# Patient Record
Sex: Female | Born: 1945 | Race: White | Hispanic: No | Smoking: Former smoker
Health system: Southern US, Community
[De-identification: ages and names within clinical notes are randomized; demographics above are authoritative.]

## PROBLEM LIST (undated history)

## (undated) DIAGNOSIS — Z950 Presence of cardiac pacemaker: Secondary | ICD-10-CM

## (undated) DIAGNOSIS — E785 Hyperlipidemia, unspecified: Secondary | ICD-10-CM

## (undated) DIAGNOSIS — K449 Diaphragmatic hernia without obstruction or gangrene: Secondary | ICD-10-CM

## (undated) DIAGNOSIS — Z8619 Personal history of other infectious and parasitic diseases: Secondary | ICD-10-CM

## (undated) DIAGNOSIS — M1990 Unspecified osteoarthritis, unspecified site: Secondary | ICD-10-CM

## (undated) DIAGNOSIS — C8590 Non-Hodgkin lymphoma, unspecified, unspecified site: Secondary | ICD-10-CM

## (undated) HISTORY — PX: ANKLE SURGERY: SHX546

## (undated) HISTORY — DX: Personal history of other infectious and parasitic diseases: Z86.19

## (undated) HISTORY — DX: Unspecified osteoarthritis, unspecified site: M19.90

## (undated) HISTORY — DX: Hyperlipidemia, unspecified: E78.5

---

## 1999-07-31 ENCOUNTER — Encounter: Admission: RE | Admit: 1999-07-31 | Discharge: 1999-07-31 | Payer: Self-pay | Admitting: Emergency Medicine

## 1999-07-31 ENCOUNTER — Encounter: Payer: Self-pay | Admitting: Emergency Medicine

## 2001-02-17 ENCOUNTER — Encounter: Admission: RE | Admit: 2001-02-17 | Discharge: 2001-02-17 | Payer: Self-pay | Admitting: Emergency Medicine

## 2001-02-17 ENCOUNTER — Encounter: Payer: Self-pay | Admitting: Emergency Medicine

## 2001-03-18 ENCOUNTER — Other Ambulatory Visit: Admission: RE | Admit: 2001-03-18 | Discharge: 2001-03-18 | Payer: Self-pay | Admitting: Emergency Medicine

## 2002-02-22 ENCOUNTER — Encounter: Payer: Self-pay | Admitting: Emergency Medicine

## 2002-02-22 ENCOUNTER — Encounter: Admission: RE | Admit: 2002-02-22 | Discharge: 2002-02-22 | Payer: Self-pay | Admitting: Emergency Medicine

## 2002-04-24 ENCOUNTER — Encounter: Payer: Self-pay | Admitting: Emergency Medicine

## 2002-04-24 ENCOUNTER — Encounter: Admission: RE | Admit: 2002-04-24 | Discharge: 2002-04-24 | Payer: Self-pay | Admitting: Emergency Medicine

## 2004-06-27 ENCOUNTER — Encounter: Admission: RE | Admit: 2004-06-27 | Discharge: 2004-06-27 | Payer: Self-pay | Admitting: Emergency Medicine

## 2006-01-06 ENCOUNTER — Encounter: Admission: RE | Admit: 2006-01-06 | Discharge: 2006-01-06 | Payer: Self-pay | Admitting: Family Medicine

## 2006-10-05 LAB — HM COLONOSCOPY

## 2007-01-10 ENCOUNTER — Encounter: Admission: RE | Admit: 2007-01-10 | Discharge: 2007-01-10 | Payer: Self-pay | Admitting: Obstetrics and Gynecology

## 2007-02-11 ENCOUNTER — Ambulatory Visit (HOSPITAL_COMMUNITY): Admission: RE | Admit: 2007-02-11 | Discharge: 2007-02-11 | Payer: Self-pay | Admitting: Obstetrics and Gynecology

## 2010-07-16 ENCOUNTER — Encounter: Admission: RE | Admit: 2010-07-16 | Discharge: 2010-07-16 | Payer: Self-pay | Admitting: Obstetrics and Gynecology

## 2011-07-31 ENCOUNTER — Other Ambulatory Visit: Payer: Self-pay | Admitting: Obstetrics and Gynecology

## 2011-07-31 ENCOUNTER — Other Ambulatory Visit: Payer: Self-pay | Admitting: Family Medicine

## 2011-07-31 DIAGNOSIS — Z1231 Encounter for screening mammogram for malignant neoplasm of breast: Secondary | ICD-10-CM

## 2011-08-17 ENCOUNTER — Ambulatory Visit: Payer: Self-pay

## 2012-07-29 DIAGNOSIS — Z23 Encounter for immunization: Secondary | ICD-10-CM | POA: Diagnosis not present

## 2012-09-01 ENCOUNTER — Emergency Department (HOSPITAL_COMMUNITY): Payer: Medicare Other

## 2012-09-01 ENCOUNTER — Emergency Department (HOSPITAL_COMMUNITY)
Admission: EM | Admit: 2012-09-01 | Discharge: 2012-09-01 | Disposition: A | Discharge status: Home / Self Care | Payer: Medicare Other | Attending: Emergency Medicine | Admitting: Emergency Medicine

## 2012-09-01 ENCOUNTER — Encounter (HOSPITAL_COMMUNITY): Payer: Self-pay

## 2012-09-01 DIAGNOSIS — R072 Precordial pain: Secondary | ICD-10-CM | POA: Diagnosis not present

## 2012-09-01 DIAGNOSIS — R110 Nausea: Secondary | ICD-10-CM | POA: Insufficient documentation

## 2012-09-01 DIAGNOSIS — R079 Chest pain, unspecified: Secondary | ICD-10-CM | POA: Diagnosis not present

## 2012-09-01 DIAGNOSIS — R0602 Shortness of breath: Secondary | ICD-10-CM | POA: Insufficient documentation

## 2012-09-01 DIAGNOSIS — K449 Diaphragmatic hernia without obstruction or gangrene: Secondary | ICD-10-CM | POA: Diagnosis not present

## 2012-09-01 DIAGNOSIS — R61 Generalized hyperhidrosis: Secondary | ICD-10-CM | POA: Insufficient documentation

## 2012-09-01 LAB — COMPREHENSIVE METABOLIC PANEL
ALT: 16 U/L (ref 0–35)
AST: 20 U/L (ref 0–37)
Alkaline Phosphatase: 84 U/L (ref 39–117)
CO2: 25 mEq/L (ref 19–32)
Calcium: 9.3 mg/dL (ref 8.4–10.5)
Chloride: 101 mEq/L (ref 96–112)
GFR calc Af Amer: >90 mL/min (ref >90)
GFR calc non Af Amer: 88 mL/min — ABNORMAL LOW (ref >90)
Glucose, Bld: 170 mg/dL — ABNORMAL HIGH (ref 70–99)
Potassium: 3.8 mEq/L (ref 3.5–5.1)
Sodium: 136 mEq/L (ref 135–145)
Total Bilirubin: 0.7 mg/dL (ref 0.3–1.2)

## 2012-09-01 LAB — CBC WITH DIFFERENTIAL/PLATELET
Basophils Absolute: 0 10*3/uL (ref 0.0–0.1)
Eosinophils Relative: 2 % (ref 0–5)
Lymphocytes Relative: 11 % — ABNORMAL LOW (ref 12–46)
Lymphs Abs: 0.9 10*3/uL (ref 0.7–4.0)
MCV: 87 fL (ref 78.0–100.0)
Neutro Abs: 6.9 10*3/uL (ref 1.7–7.7)
Platelets: 235 10*3/uL (ref 150–400)
RBC: 3.92 MIL/uL (ref 3.87–5.11)
WBC: 8.4 10*3/uL (ref 4.0–10.5)

## 2012-09-01 MED ORDER — MORPHINE SULFATE 4 MG/ML IJ SOLN
4.0000 mg | Freq: Once | INTRAMUSCULAR | Status: AC
  Administered 2012-09-01: 4 mg via INTRAVENOUS
  Filled 2012-09-01: qty 1

## 2012-09-01 NOTE — ED Notes (Signed)
Patient has arrived in cdu for further monitoring and for a repeat troponin after an episode of chest pain at home around noon. States pain started in her left shoulder and radiated across her chest to her epigastric area. States became very sweaty but not sob.

## 2012-09-01 NOTE — ED Provider Notes (Signed)
4:23 PM  Patient is a 66 yo F with no sig PMHx that presented to ER for substernal CP onset around 1 pm was placed in CDU pending 5:30 PM troponin. Patient care resumed from Dr. Estell Harpin in fast track. Patient re-evaluated and is resting comfortable chest pain free with VSS, with no new complaints or concerns at this time. Plan per previous provider is to dc if troponin is not elevated, start on PPI & follow up w OP cardiology. On exam: hemodynamically stable, NAD, heart w/ RRR, lungs CTAB, Chest & abd non-tender, no peripheral edema or calf tenderness.   5:30 PM Pt still CP free, trop pending  6:32 PM  Patient is to be discharged with recommendation to follow up with PCP & cardiologist in regards to today's hospital visit. Chest pain is not likely of cardiac or pulmonary etiology d/t presentation, perc negative, VSS, no tracheal deviation, no JVD or new murmur, RRR, breath sounds equal bilaterally, EKG without acute abnormalities, negative troponin x 2, and negative CXR. Pt has been advised start a PPI and return to the ED is CP becomes exertional, associated with diaphoresis or nausea, radiates to left jaw/arm, worsens or becomes concerning in any way. Pt appears reliable for follow up and is agreeable to discharge.   Case has been discussed with and seen by Dr. Estell Harpin who agrees with the above plan to discharge.    Jaci Carrel, [place] 09/01/12 [phone]

## 2012-09-01 NOTE — ED Provider Notes (Signed)
History  This chart was scribed for Patricia Lennert, MD by Erskine Emery, ED Scribe. This patient was seen in room A08C/A08C and the patient's care was started at 14:07.   CSN: [id number]  Arrival date & time 09/01/12  1348   First MD Initiated Contact with Patient 09/01/12 1407      Chief Complaint  Patient presents with  . Chest Pain    (Consider location/radiation/quality/duration/timing/severity/associated sxs/prior Treatment) Patricia Murillo is a 66 y.o. female brought in by ambulance, who presents to the Emergency Department complaining of constant, sudden onset substernal chest pain that radiates to the left shoulder, since she began eating around 12:50pm this afternoon. Pt reports some associated SOB, diaphoresis, and nausea, but denies any coughing, emesis, or pain upon breathing. Pt reports the pain subsided en route, but she is still experiencing some chest pressure now. Pt was given 324 mg baby aspirin and 4 sl nitro via EMS. Pt is on no medications. Patient is a 66 y.o. female presenting with chest pain. The history is provided by the patient. No language interpreter was used.  Chest Pain The chest pain began 1 - 2 hours ago. Chest pain occurs constantly. The chest pain is improving. The pain is associated with eating. The severity of the pain is moderate. The pain radiates to the left arm. Chest pain is worsened by eating. Primary symptoms include shortness of breath and nausea. Pertinent negatives for primary symptoms include no fever, no cough, no vomiting and no altered mental status.  Associated symptoms include diaphoresis. She tried nitroglycerin and aspirin for the symptoms. There are no known risk factors.  Pertinent negatives for past medical history include no MI, no PE and no strokes.   Pt has no PCP.  History reviewed. No pertinent past medical history.  History reviewed. No pertinent past surgical history.  History reviewed. No pertinent family  history.  History  Substance Use Topics  . Smoking status: Not on file  . Smokeless tobacco: Not on file  . Alcohol Use: Not on file    OB History    Grav Para Term Preterm Abortions TAB SAB Ect Mult Living                  Review of Systems  Constitutional: Positive for diaphoresis. Negative for fever.  Respiratory: Positive for shortness of breath. Negative for cough.   Cardiovascular: Positive for chest pain.  Gastrointestinal: Positive for nausea. Negative for vomiting.  Psychiatric/Behavioral: Negative for altered mental status.  All other systems reviewed and are negative.    Allergies  Review of patient's allergies indicates not on file.  Home Medications  No current outpatient prescriptions on file.  Triage Vitals: BP 168/91  Pulse 100  Temp 98.1 F (36.7 C) (Oral)  Resp 22  Ht 5\' 7"  (1.702 m)  Wt 220 lb (99.791 kg)  BMI 34.46 kg/m2  SpO2 96%  Physical Exam  Nursing note and vitals reviewed. Constitutional: She is oriented to person, place, and time. She appears well-developed.       Anxious appearing  HENT:  Head: Normocephalic and atraumatic.  Eyes: Conjunctivae normal and EOM are normal. No scleral icterus.  Neck: Neck supple. No thyromegaly present.  Cardiovascular: Normal rate and regular rhythm.  Exam reveals no gallop and no friction rub.   No murmur heard. Pulmonary/Chest: No stridor. She has no wheezes. She has no rales. She exhibits no tenderness.  Abdominal: She exhibits no distension. There is no tenderness. There is no  rebound.  Musculoskeletal: Normal range of motion. She exhibits no edema.  Lymphadenopathy:    She has no cervical adenopathy.  Neurological: She is oriented to person, place, and time. Coordination normal.  Skin: No rash noted. No erythema.  Psychiatric: She has a normal mood and affect. Her behavior is normal.    ED Course  Procedures (including critical care time) DIAGNOSTIC STUDIES: Oxygen Saturation is 96% on [place],  adequate by my interpretation.    COORDINATION OF CARE: 14:14--I evaluated the patient and we discussed a treatment plan including morphine injection, chest x-ray, and blood work to which the pt agreed. I informed the pt and her family that her EKG looks fine and shows no indication of heart attack.  15:30--I rechecked the pt who is feeling improved. I explained to the pt the normal results of her tests: her heart enzymes are normal, as are her blood sugars, her sodium, and her potassium levels. However, the pt does have a hiatal hernia that could be causing her symptoms. I notified her that we need to do another chest x-ray and heart enzyme test to verify the results, but if those results are still normal, she will be free to go.   Results for orders placed during the hospital encounter of 09/01/12  CBC WITH DIFFERENTIAL      Component Value Range   WBC 8.4  4.0 - 10.5 K/uL   RBC 3.92  3.87 - 5.11 MIL/uL   Hemoglobin 11.1 (*) 12.0 - 15.0 g/dL   HCT 40.9 (*) 81.1 - 91.4 %   MCV 87.0  78.0 - 100.0 fL   MCH 28.3  26.0 - 34.0 pg   MCHC 32.6  30.0 - 36.0 g/dL   RDW 78.2  95.6 - 21.3 %   Platelets 235  150 - 400 K/uL   Neutrophils Relative 82 (*) 43 - 77 %   Neutro Abs 6.9  1.7 - 7.7 K/uL   Lymphocytes Relative 11 (*) 12 - 46 %   Lymphs Abs 0.9  0.7 - 4.0 K/uL   Monocytes Relative 5  3 - 12 %   Monocytes Absolute 0.4  0.1 - 1.0 K/uL   Eosinophils Relative 2  0 - 5 %   Eosinophils Absolute 0.2  0.0 - 0.7 K/uL   Basophils Relative 0  0 - 1 %   Basophils Absolute 0.0  0.0 - 0.1 K/uL  COMPREHENSIVE METABOLIC PANEL      Component Value Range   Sodium 136  135 - 145 mEq/L   Potassium 3.8  3.5 - 5.1 mEq/L   Chloride 101  96 - 112 mEq/L   CO2 25  19 - 32 mEq/L   Glucose, Bld 170 (*) 70 - 99 mg/dL   BUN 18  6 - 23 mg/dL   Creatinine, Ser 0.86  0.50 - 1.10 mg/dL   Calcium 9.3  8.4 - 57.8 mg/dL   Total Protein 6.6  6.0 - 8.3 g/dL   Albumin 3.6  3.5 - 5.2 g/dL   AST 20  0 - 37 U/L   ALT 16   0 - 35 U/L   Alkaline Phosphatase 84  39 - 117 U/L   Total Bilirubin 0.7  0.3 - 1.2 mg/dL   GFR calc non Af Amer 88 (*) >90 mL/min   GFR calc Af Amer >90  >90 mL/min  TROPONIN I      Component Value Range   Troponin I <0.30  <0.30 ng/mL   Dg Chest  Port 1 View  09/01/2012  *RADIOLOGY REPORT*  Clinical Data: Shortness of breath and cough, left-sided chest pain  PORTABLE CHEST - 1 VIEW  Comparison: None.  Findings: Mild to moderate enlargement of the cardiomediastinal silhouette is noted.  Large hiatal hernia.  Cardiac leads obscure detail.  Minimal patchy left lower lobe airspace opacity is noted. No acute osseous finding.  IMPRESSION: Patchy minimal left lower lobe airspace opacity.  Given the history of left-sided chest pain, this could represent early pneumonia of the atelectasis or scarring could have a similar appearance.  If symptoms continue, consider PA and lateral chest radiograph at full inspiration when the patient is clinically able.   Original Report Authenticated By: Christiana Pellant, M.D.       No diagnosis found.    Date: 09/01/2012  Rate80  Rhythm: normal sinus rhythm  QRS Axis: normal  Intervals: normal  ST/T Wave abnormalities: nonspecific ST changes  Conduction Disutrbances:none  Narrative Interpretation:   Old EKG Reviewed: none available   MDM        The chart was scribed for me under my direct supervision.  I personally performed the history, physical, and medical decision making and all procedures in the evaluation of this patient.Patricia Lennert, MD 09/01/12 1540

## 2012-09-01 NOTE — ED Notes (Signed)
Chest pain started at 1250, pt received 324 mg ASA, 4 sl nitro, nausea no vomiting.  Pt is shob, substernal pain radiates to left shoulder.

## 2012-09-01 NOTE — ED Provider Notes (Signed)
Medical screening examination/treatment/procedure(s) were performed by non-physician practitioner and as supervising physician I was immediately available for consultation/collaboration.   [name], MD 09/01/12 [phone]

## 2012-09-08 ENCOUNTER — Ambulatory Visit (INDEPENDENT_AMBULATORY_CARE_PROVIDER_SITE_OTHER)
Admission: RE | Admit: 2012-09-08 | Discharge: 2012-09-08 | Disposition: A | Discharge status: Home / Self Care | Payer: Medicare Other | Source: Ambulatory Visit

## 2012-09-08 ENCOUNTER — Other Ambulatory Visit (INDEPENDENT_AMBULATORY_CARE_PROVIDER_SITE_OTHER): Payer: Medicare Other

## 2012-09-08 ENCOUNTER — Ambulatory Visit (INDEPENDENT_AMBULATORY_CARE_PROVIDER_SITE_OTHER): Payer: Medicare Other | Admitting: Internal Medicine

## 2012-09-08 ENCOUNTER — Encounter: Payer: Self-pay | Admitting: Internal Medicine

## 2012-09-08 VITALS — BP 128/92 | HR 86 | Temp 98.1°F | Ht 66.0 in | Wt 234.0 lb

## 2012-09-08 DIAGNOSIS — Z131 Encounter for screening for diabetes mellitus: Secondary | ICD-10-CM

## 2012-09-08 DIAGNOSIS — E785 Hyperlipidemia, unspecified: Secondary | ICD-10-CM | POA: Diagnosis not present

## 2012-09-08 DIAGNOSIS — M8580 Other specified disorders of bone density and structure, unspecified site: Secondary | ICD-10-CM

## 2012-09-08 DIAGNOSIS — R5383 Other fatigue: Secondary | ICD-10-CM | POA: Diagnosis not present

## 2012-09-08 DIAGNOSIS — M949 Disorder of cartilage, unspecified: Secondary | ICD-10-CM

## 2012-09-08 DIAGNOSIS — R030 Elevated blood-pressure reading, without diagnosis of hypertension: Secondary | ICD-10-CM

## 2012-09-08 DIAGNOSIS — M899 Disorder of bone, unspecified: Secondary | ICD-10-CM

## 2012-09-08 DIAGNOSIS — K449 Diaphragmatic hernia without obstruction or gangrene: Secondary | ICD-10-CM

## 2012-09-08 DIAGNOSIS — Z1321 Encounter for screening for nutritional disorder: Secondary | ICD-10-CM

## 2012-09-08 DIAGNOSIS — Z23 Encounter for immunization: Secondary | ICD-10-CM

## 2012-09-08 DIAGNOSIS — Z130 Encounter for screening for diseases of the blood and blood-forming organs and certain disorders involving the immune mechanism: Secondary | ICD-10-CM

## 2012-09-08 DIAGNOSIS — Z13220 Encounter for screening for lipoid disorders: Secondary | ICD-10-CM

## 2012-09-08 DIAGNOSIS — R5381 Other malaise: Secondary | ICD-10-CM

## 2012-09-08 DIAGNOSIS — Z0000 Encounter for general adult medical examination without abnormal findings: Secondary | ICD-10-CM | POA: Diagnosis not present

## 2012-09-08 LAB — CBC
HCT: 38 % (ref 36.0–46.0)
Hemoglobin: 12.5 g/dL (ref 12.0–15.0)
Platelets: 243 10*3/uL (ref 150.0–400.0)
RBC: 4.37 Mil/uL (ref 3.87–5.11)
WBC: 7.9 10*3/uL (ref 4.5–10.5)

## 2012-09-08 LAB — BASIC METABOLIC PANEL
CO2: 27 mEq/L (ref 19–32)
Calcium: 9.4 mg/dL (ref 8.4–10.5)
Chloride: 102 mEq/L (ref 96–112)
Creatinine, Ser: 0.7 mg/dL (ref 0.4–1.2)
Glucose, Bld: 103 mg/dL — ABNORMAL HIGH (ref 70–99)

## 2012-09-08 LAB — LIPID PANEL
Total CHOL/HDL Ratio: 4
Triglycerides: 104 mg/dL (ref 0.0–149.0)

## 2012-09-08 LAB — LDL CHOLESTEROL, DIRECT: Direct LDL: 176.1 mg/dL

## 2012-09-08 NOTE — Progress Notes (Signed)
HPI  Pt presents to the clinic today to establish care. She has no concerns.  Flu vaccine: 07/2012 Pneumovax: never  Zostavax: never Pap smear: 2011 Mammogram: 2011 Tdap: within the last 10 years Eye doctor: yearly Dentist: no Bone density: 2011  Past Medical History  Diagnosis Date  . Arthritis   . History of chicken pox     Current Outpatient Prescriptions  Medication Sig Dispense Refill  . omeprazole (PRILOSEC) 20 MG capsule Take 20 mg by mouth daily.      . naproxen sodium (ANAPROX) 220 MG tablet Take 220 mg by mouth 2 (two) times daily as needed. For pain        No Known Allergies  Family History  Problem Relation Age of Onset  . Hypertension Mother   . Arthritis Mother   . Arthritis Father   . Cancer Sister     Breast Cancer    History   Social History  . Marital Status: Divorced    Spouse Name: N/A    Number of Children: 3  . Years of Education: 12   Occupational History  . Retired    Social History Main Topics  . Smoking status: Former Smoker    Quit date: 10/05/2001  . Smokeless tobacco: Not on file  . Alcohol Use: No  . Drug Use: No  . Sexually Active: Not Currently   Other Topics Concern  . Not on file   Social History Narrative   Regular exercise-noCaffeine Use-yes    ROS:  Constitutional: Pt reports fatigue. Denies fever, malaise, headache or abrupt weight changes.  HEENT: Denies eye pain, eye redness, ear pain, ringing in the ears, wax buildup, runny nose, nasal congestion, bloody nose, or sore throat. Respiratory: Denies difficulty breathing, shortness of breath, cough or sputum production.   Cardiovascular: Denies chest pain, chest tightness, palpitations or swelling in the hands or feet.  Gastrointestinal: Denies abdominal pain, bloating, constipation, diarrhea or blood in the stool.  GU: Denies frequency, urgency, pain with urination, blood in urine, odor or discharge. Musculoskeletal: Denies decrease in range of motion,  difficulty with gait, muscle pain or joint pain and swelling.  Skin: Denies redness, rashes, lesions or ulcercations.  Neurological: Denies dizziness, difficulty with memory, difficulty with speech or problems with balance and coordination.   No other specific complaints in a complete review of systems (except as listed in HPI above).  PE:  BP 128/92  Pulse 86  Temp 98.1 F (36.7 C) (Oral)  Ht 5\' 6"  (1.676 m)  Wt 234 lb (106.142 kg)  BMI 37.77 kg/m2  SpO2 94% Wt Readings from Last 3 Encounters:  09/08/12 234 lb (106.142 kg)  09/01/12 220 lb (99.791 kg)    General: Appears her stated age, obese but well developed, well nourished in NAD. HEENT: Head: normal shape and size; Eyes: sclera white, no icterus, conjunctiva pink, PERRLA and EOMs intact; Ears: Tm's gray and intact, normal light reflex; Nose: mucosa pink and moist, septum midline; Throat/Mouth: Teeth present, mucosa pink and moist, no lesions or ulcerations noted.  Neck: Normal range of motion. Neck supple, trachea midline. No massses, lumps or thyromegaly present.  Cardiovascular: Normal rate and rhythm. S1,S2 noted.  No murmur, rubs or gallops noted. No JVD or BLE edema. No carotid bruits noted. Pulmonary/Chest: Normal effort and positive vesicular breath sounds. No respiratory distress. No wheezes, rales or ronchi noted.  Abdomen: Soft and nontender. Normal bowel sounds, no bruits noted. No distention or masses noted. Liver, spleen and kidneys non  palpable. Musculoskeletal: Normal range of motion. No signs of joint swelling. No difficulty with gait.  Neurological: Alert and oriented. Cranial nerves II-XII intact. Coordination normal. +DTRs bilaterally. Psychiatric: Mood and affect normal. Behavior is normal. Judgment and thought content normal.     Assessment and Plan:  Preventative Health Maintenance:  Will obtain labs to workup fatigue: CBC,BMET, lipids, vit D Will schedule referral for colonoscopy Pneumovax given  today.  Pt will think about zostavax. Declines scheduling a pap smear and mammogram at this time  Will get dexa scan today  RTC in 1 year or sooner if needed

## 2012-09-08 NOTE — Addendum Note (Signed)
Addended by: Carin Primrose on: 09/08/2012 02:04 PM   Modules accepted: Orders

## 2012-09-08 NOTE — Patient Instructions (Signed)

## 2012-09-09 LAB — VITAMIN D 25 HYDROXY (VIT D DEFICIENCY, FRACTURES): Vit D, 25-Hydroxy: 28 ng/mL — ABNORMAL LOW (ref 30–89)

## 2012-09-12 ENCOUNTER — Other Ambulatory Visit: Payer: Self-pay | Admitting: *Deleted

## 2012-09-12 DIAGNOSIS — R739 Hyperglycemia, unspecified: Secondary | ICD-10-CM | POA: Insufficient documentation

## 2012-09-13 ENCOUNTER — Other Ambulatory Visit: Payer: Self-pay | Admitting: Internal Medicine

## 2012-09-13 ENCOUNTER — Other Ambulatory Visit (INDEPENDENT_AMBULATORY_CARE_PROVIDER_SITE_OTHER): Payer: Medicare Other

## 2012-09-13 DIAGNOSIS — R739 Hyperglycemia, unspecified: Secondary | ICD-10-CM

## 2012-09-13 DIAGNOSIS — R7309 Other abnormal glucose: Secondary | ICD-10-CM | POA: Diagnosis not present

## 2012-12-12 ENCOUNTER — Ambulatory Visit: Payer: Medicare Other | Admitting: Internal Medicine

## 2012-12-15 ENCOUNTER — Encounter: Payer: Self-pay | Admitting: Internal Medicine

## 2012-12-15 ENCOUNTER — Other Ambulatory Visit (INDEPENDENT_AMBULATORY_CARE_PROVIDER_SITE_OTHER): Payer: Medicare Other

## 2012-12-15 ENCOUNTER — Ambulatory Visit (INDEPENDENT_AMBULATORY_CARE_PROVIDER_SITE_OTHER): Payer: Medicare Other | Admitting: Internal Medicine

## 2012-12-15 DIAGNOSIS — M25531 Pain in right wrist: Secondary | ICD-10-CM

## 2012-12-15 DIAGNOSIS — M25569 Pain in unspecified knee: Secondary | ICD-10-CM

## 2012-12-15 DIAGNOSIS — M19029 Primary osteoarthritis, unspecified elbow: Secondary | ICD-10-CM

## 2012-12-15 DIAGNOSIS — E785 Hyperlipidemia, unspecified: Secondary | ICD-10-CM

## 2012-12-15 DIAGNOSIS — M25561 Pain in right knee: Secondary | ICD-10-CM

## 2012-12-15 DIAGNOSIS — E559 Vitamin D deficiency, unspecified: Secondary | ICD-10-CM | POA: Diagnosis not present

## 2012-12-15 DIAGNOSIS — K219 Gastro-esophageal reflux disease without esophagitis: Secondary | ICD-10-CM

## 2012-12-15 DIAGNOSIS — M25539 Pain in unspecified wrist: Secondary | ICD-10-CM | POA: Diagnosis not present

## 2012-12-15 LAB — LIPID PANEL
Cholesterol: 243 mg/dL — ABNORMAL HIGH (ref 0–200)
Total CHOL/HDL Ratio: 5
Triglycerides: 133 mg/dL (ref 0.0–149.0)

## 2012-12-15 LAB — LDL CHOLESTEROL, DIRECT: Direct LDL: 183.5 mg/dL

## 2012-12-15 MED ORDER — MELOXICAM 15 MG PO TABS: 15.0000 mg | ORAL_TABLET | Freq: Every day | ORAL | Status: DC

## 2012-12-15 NOTE — Assessment & Plan Note (Signed)
Stop the Naprosyn today eRx for Mobic 15 mg daily Ice as needed Use Tylenol to accompany Mobic as needed 

## 2012-12-15 NOTE — Assessment & Plan Note (Signed)
Stop the Naprosyn today eRx for Mobic 15 mg daily Ice as needed Use Tylenol to accompany Mobic as needed

## 2012-12-15 NOTE — Patient Instructions (Signed)

## 2012-12-15 NOTE — Assessment & Plan Note (Signed)
Continue to work on diet and exercise changes Will recheck lipid profile today May start statin therapy if still elevated

## 2012-12-15 NOTE — Progress Notes (Signed)
Subjective:    Patient ID: Patricia Murillo, female    DOB: 10-Sep-1946, 67 y.o.   MRN: [id number]  HPI  Pt presents to the clinic today for 3 month f/u for hyperlipidemia, vit D deficiency and GERD. She has made some diet changes. She has cut out all fast food and limited her fried foods. She very rarely goes out to eat anymore. She has not started exercising and has not lost any weight since her last visit. She does report that she would prefer not to start statin therapy if she doesn't have too. She has also been taking her Vit D supplement as I asked her to. She has not had any problems with the medication. She also asks today if she needs to continue to take the prilosec for reflux, even though she has not had any symptoms of reflux since she started the medication. Additionally, she does have some concerns about her arthritis. 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 The pain does not affect her mobility or ADL's. She has been taking Naprosen BID which is not helping much. She is wondering if there is something else that might help her pain.  Review of Systems      Past Medical History  Diagnosis Date  . Arthritis   . History of chicken pox     Current Outpatient Prescriptions  Medication Sig Dispense Refill  . omeprazole (PRILOSEC) 20 MG capsule Take 20 mg by mouth daily.      . meloxicam (MOBIC) 15 MG tablet Take 1 tablet (15 mg total) by mouth daily.  30 tablet  2   No current facility-administered medications for this visit.    No Known Allergies  Family History  Problem Relation Age of Onset  . Hypertension Mother   . Arthritis Mother   . Arthritis Father   . Cancer Sister     Breast Cancer    History   Social History  . Marital Status: Divorced    Spouse Name: N/A    Number of Children: 3  . Years of Education: 12   Occupational History  . Retired    Social History Main Topics  . Smoking status: Former Smoker    Quit date:  10/05/2001  . Smokeless tobacco: Not on file  . Alcohol Use: No  . Drug Use: No  . Sexually Active: Not Currently   Other Topics Concern  . Not on file   Social History Narrative   Regular exercise-no   Caffeine Use-yes     Constitutional: Denies fever, malaise, fatigue, headache or abrupt weight changes.  Respiratory: Denies difficulty breathing, shortness of breath, cough or sputum production.   Cardiovascular: Denies chest pain, chest tightness, palpitations or swelling in the hands or feet.  Gastrointestinal: Denies abdominal pain, bloating, constipation, diarrhea or blood in the stool.  Musculoskeletal: Pt reports arthritis pain. Denies decrease in range of motion, difficulty with gait, muscle pain or joint swelling.    No other specific complaints in a complete review of systems (except as listed in HPI above).  Objective:   Physical Exam   BP 122/90  Pulse 80  Temp(Src) 98 F (36.7 C) (Oral)  Ht 5\' 6"  (1.676 m)  Wt 242 lb (109.77 kg)  BMI 39.08 kg/m2  SpO2 97% Wt Readings from Last 3 Encounters:  12/15/12 242 lb (109.77 kg)  09/08/12 234 lb (106.142 kg)  09/01/12 220 lb (99.791 kg)    General: Appears her stated age,  obese but well developed, well nourished in NAD.  Cardiovascular: Normal rate and rhythm. S1,S2 noted.  No murmur, rubs or gallops noted. No JVD or BLE edema. No carotid bruits noted. Pulmonary/Chest: Normal effort and positive vesicular breath sounds. No respiratory distress. No wheezes, rales or ronchi noted.  Abdomen: Soft and nontender. Normal bowel sounds, no bruits noted. No distention or masses noted. Liver, spleen and kidneys non palpable. Musculoskeletal: + crepitus with range of motion of knee. No signs of joint swelling. No difficulty with gait.  Neurological: Alert and oriented. Cranial nerves II-XII intact. Coordination normal. +DTRs bilaterally.   BMET    Component Value Date/Time   NA 137 09/08/2012 1357   K 4.1 09/08/2012 1357    CL 102 09/08/2012 1357   CO2 27 09/08/2012 1357   GLUCOSE 103* 09/08/2012 1357   BUN 18 09/08/2012 1357   CREATININE 0.7 09/08/2012 1357   CALCIUM 9.4 09/08/2012 1357   GFRNONAA 88* 09/01/2012 1425   GFRAA >90 09/01/2012 1425    Lipid Panel     Component Value Date/Time   CHOL 241* 09/08/2012 1357   TRIG 104.0 09/08/2012 1357   HDL 53.80 09/08/2012 1357   CHOLHDL 4 09/08/2012 1357   VLDL 20.8 09/08/2012 1357    CBC    Component Value Date/Time   WBC 7.9 09/08/2012 1357   RBC 4.37 09/08/2012 1357   HGB 12.5 09/08/2012 1357   HCT 38.0 09/08/2012 1357   PLT 243.0 09/08/2012 1357   MCV 86.9 09/08/2012 1357   MCH 28.3 09/01/2012 1425   MCHC 32.8 09/08/2012 1357   RDW 13.8 09/08/2012 1357   LYMPHSABS 0.9 09/01/2012 1425   MONOABS 0.4 09/01/2012 1425   EOSABS 0.2 09/01/2012 1425   BASOSABS 0.0 09/01/2012 1425    Hgb A1C Lab Results  Component Value Date   HGBA1C 5.3 09/13/2012        Assessment & Plan:

## 2012-12-15 NOTE — Assessment & Plan Note (Signed)
Continue taking your supplements as prescribed Try to do 30 minutes of weight bearing exercise 3-4 times per week Will recheck Vit D level at annual visit

## 2012-12-15 NOTE — Assessment & Plan Note (Signed)
Can trial pt off of prilosec If well controlled and no reflux symptoms, no need to restart prilosec If symptoms reoccur, I recommend going back on the prilosec

## 2012-12-16 ENCOUNTER — Other Ambulatory Visit: Payer: Self-pay | Admitting: Internal Medicine

## 2012-12-16 MED ORDER — SIMVASTATIN 10 MG PO TABS: 10.0000 mg | ORAL_TABLET | Freq: Every day | ORAL | Status: DC

## 2013-06-15 DIAGNOSIS — Z23 Encounter for immunization: Secondary | ICD-10-CM | POA: Diagnosis not present

## 2013-08-10 ENCOUNTER — Other Ambulatory Visit: Payer: Self-pay

## 2014-03-14 ENCOUNTER — Emergency Department (HOSPITAL_COMMUNITY)
Admission: EM | Admit: 2014-03-14 | Discharge: 2014-03-14 | Disposition: A | Discharge status: Home / Self Care | Payer: Medicare Other | Attending: Emergency Medicine | Admitting: Emergency Medicine

## 2014-03-14 ENCOUNTER — Emergency Department (HOSPITAL_COMMUNITY): Payer: Medicare Other

## 2014-03-14 ENCOUNTER — Encounter (HOSPITAL_COMMUNITY): Payer: Self-pay | Admitting: Emergency Medicine

## 2014-03-14 DIAGNOSIS — R079 Chest pain, unspecified: Secondary | ICD-10-CM | POA: Diagnosis not present

## 2014-03-14 DIAGNOSIS — M129 Arthropathy, unspecified: Secondary | ICD-10-CM | POA: Insufficient documentation

## 2014-03-14 DIAGNOSIS — Z87891 Personal history of nicotine dependence: Secondary | ICD-10-CM | POA: Insufficient documentation

## 2014-03-14 DIAGNOSIS — Z79899 Other long term (current) drug therapy: Secondary | ICD-10-CM | POA: Diagnosis not present

## 2014-03-14 DIAGNOSIS — R071 Chest pain on breathing: Secondary | ICD-10-CM | POA: Insufficient documentation

## 2014-03-14 DIAGNOSIS — K219 Gastro-esophageal reflux disease without esophagitis: Secondary | ICD-10-CM | POA: Diagnosis not present

## 2014-03-14 DIAGNOSIS — Z791 Long term (current) use of non-steroidal anti-inflammatories (NSAID): Secondary | ICD-10-CM | POA: Diagnosis not present

## 2014-03-14 DIAGNOSIS — Z8619 Personal history of other infectious and parasitic diseases: Secondary | ICD-10-CM | POA: Insufficient documentation

## 2014-03-14 LAB — CBC
HCT: 39.3 % (ref 36.0–46.0)
Hemoglobin: 12.6 g/dL (ref 12.0–15.0)
MCH: 26.8 pg (ref 26.0–34.0)
MCHC: 32.1 g/dL (ref 30.0–36.0)
MCV: 83.4 fL (ref 78.0–100.0)
Platelets: 234 10*3/uL (ref 150–400)
RBC: 4.71 MIL/uL (ref 3.87–5.11)
RDW: 14 % (ref 11.5–15.5)
WBC: 9.9 10*3/uL (ref 4.0–10.5)

## 2014-03-14 LAB — PRO B NATRIURETIC PEPTIDE: Pro B Natriuretic peptide (BNP): 93.1 pg/mL (ref 0–125)

## 2014-03-14 LAB — BASIC METABOLIC PANEL
BUN: 15 mg/dL (ref 6–23)
CALCIUM: 9.9 mg/dL (ref 8.4–10.5)
CO2: 24 mEq/L (ref 19–32)
Chloride: 102 mEq/L (ref 96–112)
Creatinine, Ser: 0.79 mg/dL (ref 0.50–1.10)
GFR, EST NON AFRICAN AMERICAN: 84 mL/min — AB (ref >90)
Glucose, Bld: 124 mg/dL — ABNORMAL HIGH (ref 70–99)
POTASSIUM: 4.1 meq/L (ref 3.7–5.3)
Sodium: 143 mEq/L (ref 137–147)

## 2014-03-14 LAB — I-STAT TROPONIN, ED
TROPONIN I, POC: 0 ng/mL (ref 0.00–0.08)
Troponin i, poc: 0 ng/mL (ref 0.00–0.08)

## 2014-03-14 LAB — D-DIMER, QUANTITATIVE: D-Dimer, Quant: <0.27 ug/mL-FEU (ref 0.00–0.48)

## 2014-03-14 MED ORDER — ONDANSETRON HCL 4 MG/2ML IJ SOLN
4.0000 mg | Freq: Once | INTRAMUSCULAR | Status: AC
  Administered 2014-03-14: 4 mg via INTRAVENOUS
  Filled 2014-03-14: qty 2

## 2014-03-14 MED ORDER — RANITIDINE HCL 150 MG PO CAPS: 150.0000 mg | ORAL_CAPSULE | Freq: Every day | ORAL | Status: DC

## 2014-03-14 MED ORDER — MORPHINE SULFATE 4 MG/ML IJ SOLN
4.0000 mg | Freq: Once | INTRAMUSCULAR | Status: AC
  Administered 2014-03-14: 4 mg via INTRAVENOUS
  Filled 2014-03-14: qty 1

## 2014-03-14 MED ORDER — OMEPRAZOLE 20 MG PO CPDR: 20.0000 mg | DELAYED_RELEASE_CAPSULE | Freq: Every day | ORAL | Status: DC

## 2014-03-14 MED ORDER — GI COCKTAIL ~~LOC~~
30.0000 mL | Freq: Once | ORAL | Status: AC
  Administered 2014-03-14: 30 mL via ORAL
  Filled 2014-03-14: qty 30

## 2014-03-14 NOTE — ED Notes (Signed)
Per pt sts chest pain that started about 2 hours ago while sitting. sts hurts when she takes a deep breath.

## 2014-03-14 NOTE — ED Provider Notes (Signed)
CSN: [id number]     Arrival date & time 03/14/14  1442 History   First MD Initiated Contact with Patient 03/14/14 1537     Chief Complaint  Patient presents with  . Chest Pain     (Consider location/radiation/quality/duration/timing/severity/associated sxs/prior Treatment) HPI  68 year old female presents complaining of chest pain. Patient reports about 3 hours ago while she was sitting down eating watermelon she experiencing a sharp sensation to the left chest, nonradiating, intense, worsening with taking deep breath. Pain has been persistent and reproducible with deep breathing. She reports that she breaks out into a sweat, felt nauseous and vomited once. Vomitus is nonbloody nonbilious. She has never had the symptoms before, and therefore decided to come to ER for further evaluation she denies any fever, chills, headache, lightheadedness, dizziness, back pain, shortness of breath, dyspnea on exertion, productive cough, hemoptysis, abdominal back pain, or rash. She denies any recent strenuous activities. She is a former smoker and does not have any significant cardiac disease. No prior history of PE or DVT, no recent surgery, prolonged bed rest, unilateral leg swelling, history of cancer, or currently taking hormone therapy. No specific treatment tried.    Past Medical History  Diagnosis Date  . Arthritis   . History of chicken pox    History reviewed. No pertinent past surgical history. Family History  Problem Relation Age of Onset  . Hypertension Mother   . Arthritis Mother   . Arthritis Father   . Cancer Sister     Breast Cancer   History  Substance Use Topics  . Smoking status: Former Smoker    Quit date: 10/05/2001  . Smokeless tobacco: Not on file  . Alcohol Use: No   OB History   Grav Para Term Preterm Abortions TAB SAB Ect Mult Living                 Review of Systems  All other systems reviewed and are negative.     Allergies  Review of patient's allergies  indicates no known allergies.  Home Medications   Prior to Admission medications   Medication Sig Start Date End Date Taking? Authorizing Provider  meloxicam (MOBIC) 15 MG tablet Take 1 tablet (15 mg total) by mouth daily. 12/15/12   Webb Silversmith, NP  omeprazole (PRILOSEC) 20 MG capsule Take 20 mg by mouth daily.    Historical Provider, MD  simvastatin (ZOCOR) 10 MG tablet Take 1 tablet (10 mg total) by mouth at bedtime. 12/16/12   Webb Silversmith, NP   BP 150/107  Pulse 89  Temp(Src) 98.3 F (36.8 C)  Resp 18  SpO2 94% Physical Exam  Nursing note and vitals reviewed. Constitutional: She is oriented to person, place, and time. She appears well-developed and well-nourished. No distress.  Awake, alert, nontoxic appearance  HENT:  Head: Atraumatic.  Eyes: Conjunctivae are normal. Right eye exhibits no discharge. Left eye exhibits no discharge.  Neck: Neck supple. No JVD present.  Cardiovascular: Normal rate, regular rhythm and intact distal pulses.  Exam reveals no gallop and no friction rub.   No murmur heard. Pulmonary/Chest: Effort normal. No respiratory distress. She exhibits no tenderness.  Abdominal: Soft. There is no tenderness. There is no rebound.  Musculoskeletal: She exhibits no tenderness.  ROM appears intact, no obvious focal weakness  Neurological: She is alert and oriented to person, place, and time.  Mental status and motor strength appears intact  Skin: No rash noted.  Psychiatric: She has a normal mood and affect.  ED Course  Procedures (including critical care time)  71:79 PM 68 year old female presents with pleuritic chest pain. No significant risk factor for PE however due to her age I will obtain a d-dimer. Chest x-ray ordered. No significant history of CAD. Sxs is suggestive of GERD given it happened after eating.  TIMI 1.  No abdominal bruits and pain not suggestive of AAA.  Work up initiated.  5:47 PM Patient has a halo hernia on chest x-ray otherwise her  workup has been unremarkable. Her symptoms improved after receiving a GI cocktail. She is resting comfortably. Plan to obtain a delta troponin.  Care discussed with Dr. Canary Brim. i suspect her sxs is related to GERD.  8:37 PM Delta troponin was unremarkable. Symptom is improving. Patient stable for discharge. Patient will followup with cardiology outpatient for further evaluation of her chest pain. Return precaution discussed.     Labs Review Labs Reviewed  BASIC METABOLIC PANEL - Abnormal; Notable for the following:    Glucose, Bld 124 (*)    GFR calc non Af Amer 84 (*)    All other components within normal limits  CBC  PRO B NATRIURETIC PEPTIDE  D-DIMER, QUANTITATIVE  I-STAT TROPOININ, ED  I-STAT TROPOININ, ED    Imaging Review Dg Chest 2 View  03/14/2014   CLINICAL DATA:  Left chest and arm pain  EXAM: CHEST  2 VIEW  COMPARISON:  09/01/2012  FINDINGS: Large hiatal hernia is again seen. The cardiac shadow is stable. The lungs are well-aerated without focal infiltrate or sizable effusion.  IMPRESSION: Hiatal hernia which is increased in size from the prior exam. No other focal abnormality is noted.   Electronically Signed   By: Inez Catalina M.D.   On: 03/14/2014 16:19     EKG Interpretation None      Date: 03/14/2014  Rate: 96  Rhythm: normal sinus rhythm  QRS Axis: left  Intervals: normal  ST/T Wave abnormalities: normal  Conduction Disutrbances:none  Narrative Interpretation: LVH  Old EKG Reviewed: unchanged    MDM   Final diagnoses:  Chest pain  GERD (gastroesophageal reflux disease)    BP 153/65  Pulse 66  Temp(Src) 98.3 F (36.8 C)  Resp 20  SpO2 94%  I have reviewed nursing notes and vital signs. I personally reviewed the imaging tests through PACS system  I reviewed available ER/hospitalization records thought the EMR     Domenic Moras, [place] 03/14/14 2052

## 2014-03-14 NOTE — Discharge Instructions (Signed)
You have been evaluated for your chest pain.  The pain may be related to heart burn.  Please take medication prescribed. Follow up with cardiologist for further evaluation of the chest pain. Return if your symptoms worsen or any other concern.  Chest Pain Observation It is often hard to give a specific diagnosis for the cause of chest pain. Among other possibilities your symptoms might be caused by inadequate oxygen delivery to your heart (angina). Angina that is not treated or evaluated can lead to a heart attack (myocardial infarction) or death. Blood tests, electrocardiograms, and X-rays may have been done to help determine a possible cause of your chest pain. After evaluation and observation, your health care provider has determined that it is unlikely your pain was caused by an unstable condition that requires hospitalization. However, a full evaluation of your pain may need to be completed, with additional diagnostic testing as directed. It is very important to keep your follow-up appointments. Not keeping your follow-up appointments could result in permanent heart damage, disability, or death. If there is any problem keeping your follow-up appointments, you must call your health care provider. HOME CARE INSTRUCTIONS  Due to the slight chance that your pain could be angina, it is important to follow your health care provider's treatment plan and also maintain a healthy lifestyle:  Maintain or work toward achieving a healthy weight.  Stay physically active and exercise regularly.  Decrease your salt intake.  Eat a balanced, healthy diet. Talk to a dietician to learn about heart healthy foods.  Increase your fiber intake by including whole grains, vegetables, fruits, and nuts in your diet.  Avoid situations that cause stress, anger, or depression.  Take medicines as advised by your health care provider. Report any side effects to your health care provider. Do not stop medicines or adjust the  dosages on your own.  Quit smoking. Do not use nicotine patches or gum until you check with your health care provider.  Keep your blood pressure, blood sugar, and cholesterol levels within normal limits.  Limit alcohol intake to no more than 1 drink per day for women that are not pregnant and 2 drinks per day for men.  Do not abuse drugs. SEEK IMMEDIATE MEDICAL CARE IF: You have severe chest pain or pressure which may include symptoms such as:  You feel pain or pressure in you arms, neck, jaw, or back.  You have severe back or abdominal pain, feel sick to your stomach (nauseous), or throw up (vomit).  You are sweating profusely.  You are having a fast or irregular heartbeat.  You feel short of breath while at rest.  You notice increasing shortness of breath during rest, sleep, or with activity.  You have chest pain that does not get better after rest or after taking your usual medicine.  You wake from sleep with chest pain.  You are unable to sleep because you cannot breathe.  You develop a frequent cough or you are coughing up blood.  You feel dizzy, faint, or experience extreme fatigue.  You develop severe weakness, dizziness, fainting, or chills. Any of these symptoms may represent a serious problem that is an emergency. Do not wait to see if the symptoms will go away. Call your local emergency services (911 in the [place] Do not drive yourself to the hospital. MAKE SURE YOU:  Understand these instructions.  Will watch your condition.  Will get help right away if you are not doing well or get worse. Document  Released: 10/24/2010 Document Revised: 05/24/2013 Document Reviewed: 03/23/2013 [facility] Patient Information 2014 [place], [place].

## 2014-03-14 NOTE — ED Provider Notes (Addendum)
Medical screening examination/treatment/procedure(s) were performed by non-physician practitioner and as supervising physician I was immediately available for consultation/collaboration.   EKG Interpretation None      Date: 03/14/2014  Rate: 96  Rhythm: normal sinus rhythm  QRS Axis: normal  Intervals: normal  ST/T Wave abnormalities: nonspecific ST/T changes  Conduction Disutrbances:none  Narrative Interpretation:   Old EKG Reviewed: none available EKG not available in epic for interpretation in muse   Threasa Beards, MD 03/14/14 2263  Threasa Beards, MD 03/14/14 2116

## 2014-03-14 NOTE — ED Notes (Signed)
Patient transported to X-ray 

## 2014-03-14 NOTE — ED Notes (Signed)
Pt verbalizes understanding of d/c instructions and denies any further needs at this time. 

## 2014-05-08 ENCOUNTER — Encounter (HOSPITAL_COMMUNITY): Payer: Self-pay | Admitting: Emergency Medicine

## 2014-05-08 ENCOUNTER — Emergency Department (HOSPITAL_COMMUNITY)
Admission: EM | Admit: 2014-05-08 | Discharge: 2014-05-08 | Disposition: A | Discharge status: Home / Self Care | Payer: Medicare Other | Attending: Emergency Medicine | Admitting: Emergency Medicine

## 2014-05-08 ENCOUNTER — Emergency Department (HOSPITAL_COMMUNITY): Payer: Medicare Other

## 2014-05-08 DIAGNOSIS — M25569 Pain in unspecified knee: Secondary | ICD-10-CM | POA: Insufficient documentation

## 2014-05-08 DIAGNOSIS — Z87891 Personal history of nicotine dependence: Secondary | ICD-10-CM | POA: Insufficient documentation

## 2014-05-08 DIAGNOSIS — M129 Arthropathy, unspecified: Secondary | ICD-10-CM | POA: Diagnosis not present

## 2014-05-08 DIAGNOSIS — R269 Unspecified abnormalities of gait and mobility: Secondary | ICD-10-CM | POA: Insufficient documentation

## 2014-05-08 DIAGNOSIS — M1710 Unilateral primary osteoarthritis, unspecified knee: Secondary | ICD-10-CM | POA: Diagnosis not present

## 2014-05-08 DIAGNOSIS — Z8619 Personal history of other infectious and parasitic diseases: Secondary | ICD-10-CM | POA: Insufficient documentation

## 2014-05-08 DIAGNOSIS — IMO0002 Reserved for concepts with insufficient information to code with codable children: Secondary | ICD-10-CM | POA: Diagnosis not present

## 2014-05-08 DIAGNOSIS — Z79899 Other long term (current) drug therapy: Secondary | ICD-10-CM | POA: Insufficient documentation

## 2014-05-08 DIAGNOSIS — M25562 Pain in left knee: Secondary | ICD-10-CM

## 2014-05-08 MED ORDER — HYDROCODONE-ACETAMINOPHEN 5-325 MG PO TABS: ORAL_TABLET | ORAL | Status: DC

## 2014-05-08 MED ORDER — MELOXICAM 7.5 MG PO TABS: 7.5000 mg | ORAL_TABLET | Freq: Every day | ORAL | Status: DC

## 2014-05-08 NOTE — ED Notes (Signed)
Pt. reports left knee pain / swelling onset 4 days ago , denies injury or fall , ambulatory , denies fever or chills.

## 2014-05-08 NOTE — Discharge Instructions (Signed)
Please read and follow all provided instructions.  Your diagnoses today include:  1. Left knee pain     Tests performed today include:  An x-ray of the affected area - does NOT show any broken bones, shows mild arthritis  Vital signs. See below for your results today.   Medications prescribed:   Meloxicam - anti-inflammatory pain medication  You have been prescribed an anti-inflammatory medication or NSAID. Take with food. Do not take aspirin, ibuprofen, or naproxen if taking this medication. Take smallest effective dose for the shortest duration needed for your pain. Stop taking if you experience stomach pain or vomiting.    Vicodin (hydrocodone/acetaminophen) - narcotic pain medication  DO NOT drive or perform any activities that require you to be awake and alert because this medicine can make you drowsy. BE VERY CAREFUL not to take multiple medicines containing Tylenol (also called acetaminophen). Doing so can lead to an overdose which can damage your liver and cause liver failure and possibly death.  Take any prescribed medications only as directed.  Home care instructions:   Follow any educational materials contained in this packet  Follow R.I.C.E. Protocol:  R - rest your injury   I  - use ice on injury without applying directly to skin  C - compress injury with bandage or splint  E - elevate the injury as much as possible  Follow-up instructions: Please follow-up with your primary care provider or the provided orthopedic physician (bone specialist) if you continue to have significant pain or trouble walking in 1 week. In this case you may have a severe injury that requires further care.   Return instructions:   Please return if your toes are numb or tingling, appear gray or blue, or you have severe pain (also elevate leg and loosen splint or wrap if you were given one)  Please return to the Emergency Department if you experience worsening symptoms.   Please  return if you have any other emergent concerns.  Additional Information:  Your vital signs today were: BP 163/94   Pulse 81   Temp(Src) 98.2 F (36.8 C) (Oral)   Resp 14   SpO2 98% If your blood pressure (BP) was elevated above 135/85 this visit, please have this repeated by your doctor within one month. --------------

## 2014-05-08 NOTE — ED Provider Notes (Signed)
CSN: [id number]     Arrival date & time 05/08/14  0705 History   First MD Initiated Contact with Patient 05/08/14 [phone]     Chief Complaint  Patient presents with  . Knee Pain     (Consider location/radiation/quality/duration/timing/severity/associated sxs/prior Treatment) HPI Comments: Patient presents with complaint of left knee pain for the past 4 days. She complains of swelling and pain with ambulation and movement of her knee. Patient denies fevers, nausea or vomiting. She's been taking ibuprofen without relief. Patient does not have any history of procedures on her knee or injections into her knee. No history of diabetes. She denies injury such as falling or twisting her knee. Onset of symptoms were gradual. Course is constant. Nothing makes symptoms better. Patient states that her blood pressure "runs a little high" at baseline but she has never been placed on blood pressure medication.  Patient is a 68 y.o. female presenting with knee pain. The history is provided by the patient.  Knee Pain Associated symptoms: no back pain and no neck pain     Past Medical History  Diagnosis Date  . Arthritis   . History of chicken pox    History reviewed. No pertinent past surgical history. Family History  Problem Relation Age of Onset  . Hypertension Mother   . Arthritis Mother   . Arthritis Father   . Cancer Sister     Breast Cancer   History  Substance Use Topics  . Smoking status: Former Smoker    Quit date: 10/05/2001  . Smokeless tobacco: Not on file  . Alcohol Use: No   OB History   Grav Para Term Preterm Abortions TAB SAB Ect Mult Living                 Review of Systems  Constitutional: Negative for activity change.  Musculoskeletal: Positive for arthralgias, gait problem and joint swelling. Negative for back pain and neck pain.  Skin: Negative for wound.  Neurological: Negative for weakness and numbness.   Allergies  Review of patient's allergies indicates no known  allergies.  Home Medications   Prior to Admission medications   Medication Sig Start Date End Date Taking? Authorizing Provider  ibuprofen (ADVIL,MOTRIN) 200 MG tablet Take 200 mg by mouth every 6 (six) hours as needed for mild pain.   Yes Historical Provider, MD  omeprazole (PRILOSEC) 20 MG capsule Take 1 capsule (20 mg total) by mouth daily. 03/14/14  Yes Domenic Moras, PA-C   BP 163/94  Pulse 81  Temp(Src) 98.2 F (36.8 C) (Oral)  Resp 14  SpO2 98%  Physical Exam  Nursing note and vitals reviewed. Constitutional: She appears well-developed and well-nourished.  HENT:  Head: Normocephalic and atraumatic.  Eyes: Pupils are equal, round, and reactive to light.  Neck: Normal range of motion. Neck supple.  Cardiovascular: Exam reveals no decreased pulses.   Musculoskeletal: She exhibits tenderness. She exhibits no edema.       Left hip: Normal.       Left knee: She exhibits normal range of motion (full active ROM with pain), no swelling and no effusion. Tenderness found. Medial joint line and lateral joint line tenderness noted. No patellar tendon tenderness noted.       Left ankle: Normal.  No calf tenderness or swelling.  Neurological: She is alert. No sensory deficit.  Motor, sensation, and vascular distal to the injury is fully intact.   Skin: Skin is warm and dry.  Psychiatric: She has a normal mood  and affect.    ED Course  Procedures (including critical care time) Labs Review Labs Reviewed - No data to display  Imaging Review Dg Knee Complete 4 Views Left  05/08/2014   CLINICAL DATA:  Knee pain and swelling  EXAM: LEFT KNEE - COMPLETE 4+ VIEW  COMPARISON:  None.  FINDINGS: Mild degenerative changes are noted in the medial joint space. No acute fracture dislocation is seen. No gross soft tissue abnormality is noted.  IMPRESSION: Mild degenerative change without acute abnormality.   Electronically Signed   By: Inez Catalina M.D.   On: 05/08/2014 08:00     EKG  Interpretation None      7:46 AM Patient seen and examined. X-ray ordered.   Vital signs reviewed and are as follows: Filed Vitals:   05/08/14 0715  BP: 163/94  Pulse: 81  Temp: 98.2 F (36.8 C)  Resp: 14   Patient informed of x-ray results. Will give knee sleeve. Will treat with pain medication and NSAIDs. Patient told not to take additional ibuprofen or naproxen if taking prescribed NSAID.  Patient states that her blood pressure is reasonably controlled and she does not have a history of any cardiovascular issues. Feel that need for NSAID for inflammation outweights any cardiovascular risk of short-term course of NSAID.  Orthopedic followup given if not improved with conservative management. Patient also encouraged to follow up with her PCP as needed.  Patient counseled on use of narcotic pain medications. Counseled not to combine these medications with others containing tylenol. Urged not to drink alcohol, drive, or perform any other activities that requires focus while taking these medications. The patient verbalizes understanding and agrees with the plan.    MDM   Final diagnoses:  Left knee pain   Patient with left knee pain with effusion. This is likely related to osteoarthritis. Patient with good range of motion of the knee. No overlying erythema or other systemic signs of illness consistent with septic arthritis and I do not feel that a workup for septic arthritis needs to be undertaken at this time.  No dangerous or life-threatening conditions suspected or identified by history, physical exam, and by work-up. No indications for hospitalization identified.      Carlisle Cater, PA-C 05/08/14 [phone]

## 2014-05-08 NOTE — ED Provider Notes (Signed)
Medical screening examination/treatment/procedure(s) were performed by non-physician practitioner and as supervising physician I was immediately available for consultation/collaboration.    Mirna Mires, MD 05/08/14 [phone]

## 2014-05-16 DIAGNOSIS — M25569 Pain in unspecified knee: Secondary | ICD-10-CM | POA: Diagnosis not present

## 2014-06-18 DIAGNOSIS — M25569 Pain in unspecified knee: Secondary | ICD-10-CM | POA: Diagnosis not present

## 2014-07-02 DIAGNOSIS — M1710 Unilateral primary osteoarthritis, unspecified knee: Secondary | ICD-10-CM | POA: Diagnosis not present

## 2014-07-09 DIAGNOSIS — M25562 Pain in left knee: Secondary | ICD-10-CM | POA: Diagnosis not present

## 2014-07-09 DIAGNOSIS — M1711 Unilateral primary osteoarthritis, right knee: Secondary | ICD-10-CM | POA: Diagnosis not present

## 2014-07-24 DIAGNOSIS — Z23 Encounter for immunization: Secondary | ICD-10-CM | POA: Diagnosis not present

## 2014-08-08 DIAGNOSIS — M1711 Unilateral primary osteoarthritis, right knee: Secondary | ICD-10-CM | POA: Diagnosis not present

## 2015-02-28 ENCOUNTER — Ambulatory Visit (INDEPENDENT_AMBULATORY_CARE_PROVIDER_SITE_OTHER): Payer: Medicare Other | Admitting: Family

## 2015-02-28 ENCOUNTER — Encounter: Payer: Self-pay | Admitting: Family

## 2015-02-28 VITALS — BP 130/96 | HR 71 | Temp 97.5°F | Resp 18 | Ht 66.0 in | Wt 211.1 lb

## 2015-02-28 DIAGNOSIS — K219 Gastro-esophageal reflux disease without esophagitis: Secondary | ICD-10-CM

## 2015-02-28 DIAGNOSIS — L0291 Cutaneous abscess, unspecified: Secondary | ICD-10-CM | POA: Insufficient documentation

## 2015-02-28 DIAGNOSIS — L02413 Cutaneous abscess of right upper limb: Secondary | ICD-10-CM

## 2015-02-28 MED ORDER — RANITIDINE HCL 150 MG PO TABS: 150.0000 mg | ORAL_TABLET | Freq: Two times a day (BID) | ORAL | Status: DC

## 2015-02-28 NOTE — Patient Instructions (Signed)
Thank you for choosing [facility].  Summary/Instructions:  Your prescription(s) have been submitted to your pharmacy or been printed and provided for you. Please take as directed and contact our office if you believe you are having problem(s) with the medication(s) or have any questions.  Please stop by the lab on the basement level of the building for your blood work. Your results will be released to MyChart (or called to you) after review, usually within 72 hours after test completion. If any changes need to be made, you will be notified at that same time.  Referrals have been made during this visit. You should expect to hear back from our schedulers in about 7-10 days in regards to establishing an appointment with the specialists we discussed.   If your symptoms worsen or fail to improve, please contact our office for further instruction, or in case of emergency go directly to the emergency room at the closest medical facility.     

## 2015-02-28 NOTE — Progress Notes (Signed)
Subjective:    Patient ID: Patricia Murillo, female    DOB: [birth year], 69 y.o.   MRN: [id number]  Chief Complaint  Patient presents with  . Establish Care    has a hiatal hernia, and states that the prolisec that she was taking doesn't work, has them more often that she used to, want a mole looked at to see if she should go to the derma    HPI:  Patricia Murillo is a 69 y.o. 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  1.) Hiatal Hernia - previously diagnosed with a hiatal hernia and maintained on Prilosec. Has been seen in the ED and prescribed Zantac and omeprazole.  Indicates that the medication is no longer controling her symptoms. Takes 2-3 bites of food and notices symptoms starting. Not currently taking any medications to control her symptoms. Frequency of attacks is not every day, usually around 1 time every 7 days.   2.) Mole - Associated symptom of a mole located on her right shoulder has been there as long as she can remember. Indicates that it has become raised and may have gotten slightly larger but not sure. Denies any bleeding or spreading. There are no modifying factors that make it better or worse.    No Known Allergies   Current Outpatient Prescriptions on File Prior to Visit  Medication Sig Dispense Refill  . ibuprofen (ADVIL,MOTRIN) 200 MG tablet Take 200 mg by mouth every 6 (six) hours as needed for mild pain.     No current facility-administered medications on file prior to visit.    Review of Systems  Constitutional: Negative for fever and chills.  Gastrointestinal: Positive for nausea (occasionally) and vomiting (occasionally). Negative for abdominal pain.  Skin: Positive for rash.      Objective:    BP 130/96 mmHg  Pulse 71  Temp(Src) 97.5 F (36.4 C) (Oral)  Resp 18  Ht 5\' 6"  (1.676 m)  Wt 211 lb 1.9 oz (95.763 kg)  BMI 34.09 kg/m2  SpO2 95% Nursing note and vital  signs reviewed.  Physical Exam  Constitutional: She is oriented to person, place, and time. She appears well-developed and well-nourished. No distress.  Cardiovascular: Normal rate, regular rhythm, normal heart sounds and intact distal pulses.   Pulmonary/Chest: Effort normal and breath sounds normal.  Neurological: She is alert and oriented to person, place, and time.  Skin: Skin is warm and dry.  Dime sized, elevated, scaly and brownish abscess located on the right shoulder with no discharge.   Psychiatric: She has a normal mood and affect. Her behavior is normal. Judgment and thought content normal.       Assessment & Plan:    Problem List Items Addressed This Visit      Digestive   GERD (gastroesophageal reflux disease)    Continues to experience symptoms of GERD and is not maintained on medication. Discussed medication options and will trial Zantac. Start Zantac. Follow up if symptoms worsen or fail to improve.       Relevant Medications   ranitidine (ZANTAC) 150 MG tablet     Musculoskeletal and Integument   Skin abscess - Primary    Exam consistent with seberrhoic keratosis. Refer to dermatology for removal and confirmation. Follow up if symptoms worsen or fail to improve.       Relevant Orders   Ambulatory referral to Dermatology

## 2015-02-28 NOTE — Assessment & Plan Note (Signed)
Continues to experience symptoms of GERD and is not maintained on medication. Discussed medication options and will trial Zantac. Start Zantac. Follow up if symptoms worsen or fail to improve.

## 2015-02-28 NOTE — Assessment & Plan Note (Signed)
Exam consistent with seberrhoic keratosis. Refer to dermatology for removal and confirmation. Follow up if symptoms worsen or fail to improve.

## 2015-02-28 NOTE — Progress Notes (Signed)
Pre visit review using our clinic review tool, if applicable. No additional management support is needed unless otherwise documented below in the visit note. 

## 2015-03-06 ENCOUNTER — Telehealth: Payer: Self-pay | Admitting: Family

## 2015-03-06 NOTE — Telephone Encounter (Signed)
Patient was in last week and greg told her he would send her information about what to eat with her hernia. She was hoping you could email the information. [email or web address]

## 2015-03-06 NOTE — Telephone Encounter (Signed)
Sent through MyChart

## 2015-03-07 ENCOUNTER — Other Ambulatory Visit: Payer: Self-pay

## 2015-03-07 DIAGNOSIS — Z1231 Encounter for screening mammogram for malignant neoplasm of breast: Secondary | ICD-10-CM

## 2015-03-13 ENCOUNTER — Ambulatory Visit
Admission: RE | Admit: 2015-03-13 | Discharge: 2015-03-13 | Disposition: A | Discharge status: Home / Self Care | Payer: Medicare Other | Source: Ambulatory Visit

## 2015-03-13 ENCOUNTER — Telehealth: Payer: Self-pay | Admitting: Family

## 2015-03-13 DIAGNOSIS — Z1231 Encounter for screening mammogram for malignant neoplasm of breast: Secondary | ICD-10-CM

## 2015-03-13 NOTE — Telephone Encounter (Signed)
Please inform patient that her mammogram was normal and her next mammogram will be in one year.

## 2015-03-14 NOTE — Telephone Encounter (Signed)
Pt aware of results 

## 2015-03-27 ENCOUNTER — Other Ambulatory Visit: Payer: Self-pay | Admitting: Family

## 2015-04-10 ENCOUNTER — Telehealth: Payer: Self-pay | Admitting: Family

## 2015-04-10 NOTE — Telephone Encounter (Signed)
Please inform her that she can take benadryl as needed for itching. She can apply ice to the area as needed for discomfort. If she experiences any tongue swelling, shortness of breath or difficulty breathing please have her go directly to the Emergency Room.

## 2015-04-10 NOTE — Telephone Encounter (Signed)
Patient was stung by a bee and is itching really bad, please advise on what to do

## 2015-11-15 ENCOUNTER — Telehealth: Payer: Self-pay

## 2015-11-15 DIAGNOSIS — E559 Vitamin D deficiency, unspecified: Secondary | ICD-10-CM

## 2015-11-15 DIAGNOSIS — E785 Hyperlipidemia, unspecified: Secondary | ICD-10-CM

## 2015-11-15 NOTE — Telephone Encounter (Signed)
Noted this patient coming in Monday for AWV; call placed to discuss labs; appears cho was elevated and there was discussion to place on medication in 2014 but the patient does not remember this; Annual labs due.  Request repeat annual labs at [facility] and the patient will come back for fup office visit to have drawn NPO; Agrees to schedule OV if needed post labs.   Please advise, Tks, sh

## 2015-11-17 NOTE — Telephone Encounter (Signed)
Orders for labs placed 

## 2015-11-18 ENCOUNTER — Ambulatory Visit (INDEPENDENT_AMBULATORY_CARE_PROVIDER_SITE_OTHER): Payer: Medicare Other

## 2015-11-18 VITALS — BP 138/98 | Temp 98.4°F | Ht 67.0 in | Wt 223.5 lb

## 2015-11-18 DIAGNOSIS — Z780 Asymptomatic menopausal state: Secondary | ICD-10-CM | POA: Diagnosis not present

## 2015-11-18 DIAGNOSIS — Z1159 Encounter for screening for other viral diseases: Secondary | ICD-10-CM | POA: Diagnosis not present

## 2015-11-18 DIAGNOSIS — Z0000 Encounter for general adult medical examination without abnormal findings: Secondary | ICD-10-CM | POA: Diagnosis not present

## 2015-11-18 NOTE — Progress Notes (Addendum)
Subjective:   Patricia Murillo is a 70 y.o. female who presents for Medicare Annual (Subsequent) preventive examination.  Review of Systems:  HRA assessment completed during visit; The Patient was informed that this wellness visit is to identify risk and educate on how to reduce risk for increase disease through lifestyle changes.   ROS deferred to CPE exam with physician; apt tbs with Patricia Murillo  Medical issues   Presents with cough; afebrile, states she had sinus congestion x 4 weeks ago. Yesterday started to have a cough which continues today; Not sure if this is viral; Wished to stay and complete the wellness exam. Taking OTC medication; may add cough syrup as needed; will make apt if the symptoms do not improve or last longer than 7 days.   GERD; last seen for OV 05/16 started Zantac; still has reflux; has pain under LUQ and vomits to relieve paint; last episode x 3 weeks; aggravated by food at times; sometimes stress; Agreed to make an apt to fup with Patricia Murillo if this continues; as may need further study or meds; Feels the Zantac is not helping her much;   Basic labs drawn 12/2011 and repeat lipid and LDL in 12/2012 (Chol 243; Trig 133; HDL 44; LDL 183.5 NP wanted to start Zocor; no fup) Educated on need to fup; at risk for cardiac disease or other;  Vit d 28 2013/ will recheck A1c 5.3 09/2012 Patricia Piedra NP has ordered labs; need to draw labs without any food /dirnk past MN;  Will schedule for apt based on results;    Grandfather had diabetes;  Lost 7 brothers and sisters; one had breast cancer  1 other sibling living  3 children; all live in the area;  75 5;    BMI: (Lost 30lbs since 2014) 34.9 ; gained some weight this winter Diet; always has breakfast; lunch and supper. 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; (4days a week ) works longer than 60 min at her home and is over 4 hours at other 2 homes;  No OA that inhibits activity    SAFETY/ lives alone/ one level  Safety reviewed for the home;  Removal of clutter clearing paths through the home to prevent future falls Bathroom safety; shower in tub; takes showers; no issues in tub Community safety; yes Smoke detectors:yes Firearms safety / does not have any firearms Driving accidents and seatbelt/ no Sun protection/ yes Stressors; life normal at present  Medication review/  No issues;   Fall assessment; no falls  Gait assessment; no issues; mild OA of knees   Mobilization and Functional losses in the last year. No Sleep patterns; No issues  Urinary or fecal incontinence reviewed and no issues voiced    Counseling: Hep C/ discussed and added to draw with other labs.  Colonoscopy;10/2006 / Due 2018 but do not see results of colonoscopy EKG: 03/2014 Hearing: 4000hz  both ears  Dexa 09/2012 lowest t score -1.6 / will repeat (last dexa completed [facility] at [facility] per the patient)  Mammagram 03/2015  neg; ([facility])  PAP 10/2009/  Ophthalmology exam; had one about 3 years; Will schedule apt this year Immunizations Due: zostavax; PCV 13; td; flu/ All held today due to cough and possible flu or virus   Current Care Team reviewed and updated  Cardiac Risk Factors include: advanced age (>47men, >6 women);dyslipidemia;obesity (BMI >30kg/m2)     Objective:     Vitals: BP 138/98 mmHg  Temp(Src) 98.4 F (36.9 C) (Oral)  Ht 5\' 7"  (1.702 m)  Wt 223 lb 8 oz (101.379 kg)  BMI 35.00 kg/m2  Tobacco History  Smoking status  . Former Smoker  . Quit date: 10/05/2001  Smokeless tobacco  . Not on file     Counseling given: Not Answered   Past Medical History  Diagnosis Date  . Arthritis   . History of chicken pox    No past surgical history on file. Family History  Problem Relation Age of Onset  .  Hypertension Mother   . Arthritis Mother   . Arthritis Father   . Cancer Sister     Breast Cancer   History  Sexual Activity  . Sexual Activity: Not Currently    Outpatient Encounter Prescriptions as of 11/18/2015  Medication Sig  . ibuprofen (ADVIL,MOTRIN) 200 MG tablet Take 200 mg by mouth every 6 (six) hours as needed for mild pain.  . ranitidine (ZANTAC) 150 MG tablet TAKE ONE TABLET BY MOUTH TWICE DAILY   No facility-administered encounter medications on file as of 11/18/2015.    Activities of Daily Living In your present state of health, do you have any difficulty performing the following activities: 11/18/2015  Hearing? N  Vision? N  Difficulty concentrating or making decisions? N  Walking or climbing stairs? N  Dressing or bathing? N  Doing errands, shopping? N  Preparing Food and eating ? N  Using the Toilet? N  In the past six months, have you accidently leaked urine? N  Do you have problems with loss of bowel control? N  Managing your Medications? N  Managing your Finances? N  Housekeeping or managing your Housekeeping? N    Patient Care Team: Patricia Circle, FNP as PCP - General (Family Medicine)    Assessment:    Assessment   Patient presents for yearly preventative medicine examination. Medicare questionnaire screening were completed, i.e. Functional; fall risk; depression, memory loss and hearing all unremarkable   All immunizations and health maintenance protocols were reviewed with the patient but held today due to illness; Will take flu later;  Discussed Prevnar 13 and TD when she feels better or sees Patricia Piedra NP  Education provided for laboratory screens;  Discussed chol and the possible need for meds; to fup post lab draw; Given information on fat free diet   Medication reconciliation, past medical history, social history, problem list and allergies were reviewed in detail with the patient  Goals were established with regard to weight loss;  agreed to start walking again; at least on the days she does not clean.   End of life planning was discussed. Agreed to take information and discuss with [facility] pastoral dept if she had questions. Given resources.    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 Weight < 180 lb (81.647 kg)     The last time you weight at goal; you were walking; had a buddy; walked around the parking deck or the park on weekends;  Can go to country park; 5 min from home; Motivation 5; Go for it!!/ Benefits; healthier; had more energy;  just feels better;     . Weight < 200 lb (90.719 kg)      Fall Risk Fall Risk  11/18/2015 09/08/2012  Falls in the past year? No No   Depression Screen PHQ 2/9 Scores 11/18/2015 09/08/2012  PHQ - 2 Score 0 0     Cognitive Testing No flowsheet data found.   Ad8 score 0   Immunization History  Administered Date(s) Administered  . Influenza-Unspecified 08/05/2012  . Pneumococcal Polysaccharide-23 09/08/2012  . Tdap 10/06/2003   Deferred due to cold; cough today  Screening Tests Health Maintenance  Topic Date Due  . Hepatitis C Screening  10-29-1945  . ZOSTAVAX  02/12/2007  . PNA vac Low Risk Adult (2 of 2 - PCV13) 09/08/2013  . TETANUS/TDAP  10/05/2013  . INFLUENZA VACCINE  05/06/2015  . COLONOSCOPY  10/05/2016  . MAMMOGRAM  03/12/2017  . DEXA SCAN  Completed      Plan:  Will schedule with Patricia Murillo (after labs ) for GERDS and  Cold symptoms if not improved  Vaccines postponed until she is feeling better; TD; flu and prevnar 13 all due  Dexa scan to be scheduled; the patient last dexa -1.6 at femoral neck  Reflux is still problematic at times; zantac not helping; will see Patricia Murillo  if she would like to fup  Hep C ordered today  Will schedule for an eye exam soon  Can see pastoral dept at [facility] for further infor on Advance Directives; Educated regarding [place] form.  [place] offers free advance directive forms, as well as assistance in completing the forms themselves. For assistance, contact the Spiritual Care Department at [phone], or the Clinical Social Work Department at [phone].   During the course of the visit the patient was educated and counseled about the following appropriate screening and preventive services:   Vaccines to include Pneumoccal, Influenza, Hepatitis B, Td, Zostavax, HCV; understands which vaccines are due  Electrocardiogram 03/2014  Cardiovascular Disease/HTN; obesity; mod htn today;   Will monitor BP to be sure it is not trending up.   Colorectal cancer screening/ 10/2006; did not note report; 10/2016 fup  Bone density screening/ to complete  Diabetes screening/ labs to be drawn; o/w; grandfather had diabetes  Glaucoma screening/eye exam to be scheduled  Mammography/[place]; repeat 03/2016  Nutrition counseling / voices need to lose weight; agrees to exercise more by walking; Was walking when at work and had a buddy; Lives 5 minutes from the park, so this is accessible.   Patient Instructions (the written plan) was given to the patient.   Wynetta Fines, RN  11/18/2015    Medical screening examination/treatment/procedure(s) were performed by non-physician practitioner and as supervising provider I was immediately available for consultation/collaboration. I agree with above. Mauricio Po, FNP

## 2015-11-18 NOTE — Patient Instructions (Addendum)
Patricia Murillo , Thank you for taking time to come for your Medicare Wellness Visit. I appreciate your ongoing commitment to your health goals. Please review the following plan we discussed and let me know if I can assist you in the future.   Will schedule with Terri Piedra (after labs without food or water) for GERDS and ? Cold if not improved  Vaccines postponed until she is feeling better; TD; flu and prevnar 13   Dexa scan to be scheduled;  Reflux is still problematic at times  Hep C ordered   Will postpone all vaccines due to "cold" today; coughing; started x 24 hours ago   Will schedule for an eye exam  Can see [facility] dept at [facility] for further infor on [place] offers free advance directive forms, as well as assistance in completing the forms themselves.  For assistance, contact the Spiritual Care Department at [phone], or the Clinical Social Work Department at [phone].     These are the goals we discussed: Goals    . Weight < 180 lb (81.647 kg)     The last time you weight at goal; you were walking; had a buddy; walked around the parking deck or the park on weekends;  Can go to country park; 5 min from home; Motivation 5; Go for it!!/ Benefits; healthier; had more energy; just feels better;     . Weight < 200 lb (90.719 kg)       This is a list of the screening recommended for you and due dates:  Health Maintenance  Topic Date Due  .  Hepatitis C: One time screening is recommended by Center for Disease Control  (CDC) for  adults born from [birth year] through 1964.   09-Dec-1945  . Shingles Vaccine  02/12/2007  . Pneumonia vaccines (2 of 2 - PCV13) 09/08/2013  . Tetanus Vaccine  10/05/2013  . Flu Shot  05/06/2015  . Colon Cancer Screening  10/05/2016  . Mammogram  03/12/2017  . DEXA scan (bone density measurement)  Completed     Bone Densitometry Bone densitometry is an imaging test that uses a special X-ray to measure the amount of  calcium and other minerals in your bones (bone density). This test is also known as a bone mineral density test or dual-energy X-ray absorptiometry (DXA). The test can measure bone density at your hip and your spine. It is similar to having a regular X-ray. You may have this test to:  Diagnose a condition that causes weak or thin bones (osteoporosis).  Predict your risk of a broken bone (fracture).  Determine how well osteoporosis treatment is working. LET [facility] CARE PROVIDER KNOW ABOUT:  Any allergies you have.  All medicines you are taking, including vitamins, herbs, eye drops, creams, and over-the-counter medicines.  Previous problems you or members of your family have had with the use of anesthetics.  Any blood disorders you have.  Previous surgeries you have had.  Medical conditions you have.  Possibility of pregnancy.  Any other medical test you had within the previous 14 days that used contrast material. RISKS AND COMPLICATIONS Generally, this is a safe procedure. However, problems can occur and may include the following:  This test exposes you to a very small amount of radiation.  The risks of radiation exposure may be greater to unborn children. BEFORE THE PROCEDURE  Do not take any calcium supplements for 24 hours before having the test. You can otherwise eat and drink what you  usually do.  Take off all metal jewelry, eyeglasses, dental appliances, and any other metal objects. PROCEDURE  You may lie on an exam table. There will be an X-ray generator below you and an imaging device above you.  Other devices, such as boxes or braces, may be used to position your body properly for the scan.  You will need to lie still while the machine slowly scans your body.  The images will show up on a computer monitor. AFTER THE PROCEDURE You may need more testing at a later time.   This information is not intended to replace advice given to you by your health care  provider. Make sure you discuss any questions you have with your health care provider.   Document Released: 10/13/2004 Document Revised: 10/12/2014 Document Reviewed: 03/01/2014 Elsevier Interactive Patient Education 2016 [employer]  Fat and Cholesterol Restricted Diet Getting too much fat and cholesterol in your diet may cause health problems. Following this diet helps keep your fat and cholesterol at normal levels. This can keep you from getting sick. WHAT TYPES OF FAT SHOULD I CHOOSE?  Choose monosaturated and polyunsaturated fats. These are found in foods such as olive oil, canola oil, flaxseeds, walnuts, almonds, and seeds.  Eat more omega-3 fats. Good choices include salmon, mackerel, sardines, tuna, flaxseed oil, and ground flaxseeds.  Limit saturated fats. These are in animal products such as meats, butter, and cream. They can also be in plant products such as palm oil, palm kernel oil, and coconut oil.   Avoid foods with partially hydrogenated oils in them. These contain trans fats. Examples of foods that have trans fats are stick margarine, some tub margarines, cookies, crackers, and other baked goods. WHAT GENERAL GUIDELINES DO I NEED TO FOLLOW?   Check food labels. Look for the words "trans fat" and "saturated fat."  When preparing a meal:  Fill half of your plate with vegetables and green salads.  Fill one fourth of your plate with whole grains. Look for the word "whole" as the first word in the ingredient list.  Fill one fourth of your plate with lean protein foods.  Limit fruit to two servings a day. Choose fruit instead of juice.  Eat more foods with soluble fiber. Examples of foods with this type of fiber are apples, broccoli, carrots, beans, peas, and barley. Try to get 20-30 g (grams) of fiber per day.  Eat more home-cooked foods. Eat less at restaurants and buffets.  Limit or avoid alcohol.  Limit foods high in starch and sugar.  Limit fried  foods.  Cook foods without frying them. Baking, boiling, grilling, and broiling are all great options.  Lose weight if you are overweight. Losing even a small amount of weight can help your overall health. It can also help prevent diseases such as diabetes and heart disease. WHAT FOODS CAN I EAT? Grains Whole grains, such as whole wheat or whole grain breads, crackers, cereals, and pasta. Unsweetened oatmeal, bulgur, barley, quinoa, or brown rice. Corn or whole wheat flour tortillas. Vegetables Fresh or frozen vegetables (raw, steamed, roasted, or grilled). Green salads. Fruits All fresh, canned (in natural juice), or frozen fruits. Meat and Other Protein Products Ground beef (85% or leaner), grass-fed beef, or beef trimmed of fat. Skinless chicken or [place]. Ground chicken or [place]. Pork trimmed of fat. All fish and seafood. Eggs. Dried beans, peas, or lentils. Unsalted nuts or seeds. Unsalted canned or dry beans. Dairy Low-fat dairy products, such as skim or 1% milk,  2% or reduced-fat cheeses, low-fat ricotta or cottage cheese, or plain low-fat yogurt. Fats and Oils Tub margarines without trans fats. Light or reduced-fat mayonnaise and salad dressings. Avocado. Olive, canola, sesame, or safflower oils. Natural peanut or almond butter (choose ones without added sugar and oil). The items listed above may not be a complete list of recommended foods or beverages. Contact your dietitian for more options. WHAT FOODS ARE NOT RECOMMENDED? Grains White bread. White pasta. White rice. Cornbread. Bagels, pastries, and croissants. Crackers that contain trans fat. Vegetables White potatoes. Corn. Creamed or fried vegetables. Vegetables in a cheese sauce. Fruits Dried fruits. Canned fruit in light or heavy syrup. Fruit juice. Meat and Other Protein Products Fatty cuts of meat. Ribs, chicken wings, bacon, sausage, bologna, salami, chitterlings, fatback, hot dogs, bratwurst, and packaged luncheon  meats. Liver and organ meats. Dairy Whole or 2% milk, cream, half-and-half, and cream cheese. Whole milk cheeses. Whole-fat or sweetened yogurt. Full-fat cheeses. Nondairy creamers and whipped toppings. Processed cheese, cheese spreads, or cheese curds. Sweets and Desserts Corn syrup, sugars, honey, and molasses. Candy. Jam and jelly. Syrup. Sweetened cereals. Cookies, pies, cakes, donuts, muffins, and ice cream. Fats and Oils Butter, stick margarine, lard, shortening, ghee, or bacon fat. Coconut, palm kernel, or palm oils. Beverages Alcohol. Sweetened drinks (such as sodas, lemonade, and fruit drinks or punches). The items listed above may not be a complete list of foods and beverages to avoid. Contact your dietitian for more information.   This information is not intended to replace advice given to you by your health care provider. Make sure you discuss any questions you have with your health care provider.   Document Released: 03/22/2012 Document Revised: 10/12/2014 Document Reviewed: 12/21/2013 Elsevier Interactive Patient Education 2016 [place] in the Home  Falls can cause injuries. They can happen to people of all ages. There are many things you can do to make your home safe and to help prevent falls.  WHAT CAN I DO ON THE OUTSIDE OF MY HOME?  Regularly fix the edges of walkways and driveways and fix any cracks.  Remove anything that might make you trip as you walk through a door, such as a raised step or threshold.  Trim any bushes or trees on the path to your home.  Use bright outdoor lighting.  Clear any walking paths of anything that might make someone trip, such as rocks or tools.  Regularly check to see if handrails are loose or broken. Make sure that both sides of any steps have handrails.  Any raised decks and porches should have guardrails on the edges.  Have any leaves, snow, or ice cleared regularly.  Use sand or salt on walking paths  during winter.  Clean up any spills in your garage right away. This includes oil or grease spills. WHAT CAN I DO IN THE BATHROOM?   Use night lights.  Install grab bars by the toilet and in the tub and shower. Do not use towel bars as grab bars.  Use non-skid mats or decals in the tub or shower.  If you need to sit down in the shower, use a plastic, non-slip stool.  Keep the floor dry. Clean up any water that spills on the floor as soon as it happens.  Remove soap buildup in the tub or shower regularly.  Attach bath mats securely with double-sided non-slip rug tape.  Do not have throw rugs and other things on the floor that can make you  trip. WHAT CAN I DO IN THE BEDROOM?  Use night lights.  Make sure that you have a light by your bed that is easy to reach.  Do not use any sheets or blankets that are too big for your bed. They should not hang down onto the floor.  Have a firm chair that has side arms. You can use this for support while you get dressed.  Do not have throw rugs and other things on the floor that can make you trip. WHAT CAN I DO IN THE KITCHEN?  Clean up any spills right away.  Avoid walking on wet floors.  Keep items that you use a lot in easy-to-reach places.  If you need to reach something above you, use a strong step stool that has a grab bar.  Keep electrical cords out of the way.  Do not use floor polish or wax that makes floors slippery. If you must use wax, use non-skid floor wax.  Do not have throw rugs and other things on the floor that can make you trip. WHAT CAN I DO WITH MY STAIRS?  Do not leave any items on the stairs.  Make sure that there are handrails on both sides of the stairs and use them. Fix handrails that are broken or loose. Make sure that handrails are as long as the stairways.  Check any carpeting to make sure that it is firmly attached to the stairs. Fix any carpet that is loose or worn.  Avoid having throw rugs at the top  or bottom of the stairs. If you do have throw rugs, attach them to the floor with carpet tape.  Make sure that you have a light switch at the top of the stairs and the bottom of the stairs. If you do not have them, ask someone to add them for you. WHAT ELSE CAN I DO TO HELP PREVENT FALLS?  Wear shoes that:  Do not have high heels.  Have rubber bottoms.  Are comfortable and fit you well.  Are closed at the toe. Do not wear sandals.  If you use a stepladder:  Make sure that it is fully opened. Do not climb a closed stepladder.  Make sure that both sides of the stepladder are locked into place.  Ask someone to hold it for you, if possible.  Clearly mark and make sure that you can see:  Any grab bars or handrails.  First and last steps.  Where the edge of each step is.  Use tools that help you move around (mobility aids) if they are needed. These include:  Canes.  Walkers.  Scooters.  Crutches.  Turn on the lights when you go into a dark area. Replace any light bulbs as soon as they burn out.  Set up your furniture so you have a clear path. Avoid moving your furniture around.  If any of your floors are uneven, fix them.  If there are any pets around you, be aware of where they are.  Review your medicines with your doctor. Some medicines can make you feel dizzy. This can increase your chance of falling. Ask your doctor what other things that you can do to help prevent falls.   This information is not intended to replace advice given to you by your health care provider. Make sure you discuss any questions you have with your health care provider.   Document Released: 07/18/2009 Document Revised: 02/05/2015 Document Reviewed: 10/26/2014 Elsevier Interactive Patient Education [employer].  Health Maintenance, Female Adopting a healthy lifestyle and getting preventive care can go a long way to promote health and wellness. Talk with your health care provider  about what schedule of regular examinations is right for you. This is a good chance for you to check in with your provider about disease prevention and staying healthy. In between checkups, there are plenty of things you can do on your own. Experts have done a lot of research about which lifestyle changes and preventive measures are most likely to keep you healthy. Ask your health care provider for more information. WEIGHT AND DIET  Eat a healthy diet  Be sure to include plenty of vegetables, fruits, low-fat dairy products, and lean protein.  Do not eat a lot of foods high in solid fats, added sugars, or salt.  Get regular exercise. This is one of the most important things you can do for your health.  Most adults should exercise for at least 150 minutes each week. The exercise should increase your heart rate and make you sweat (moderate-intensity exercise).  Most adults should also do strengthening exercises at least twice a week. This is in addition to the moderate-intensity exercise.  Maintain a healthy weight  Body mass index (BMI) is a measurement that can be used to identify possible weight problems. It estimates body fat based on height and weight. Your health care provider can help determine your BMI and help you achieve or maintain a healthy weight.  For females 69 years of age and older:   A BMI below 18.5 is considered underweight.  A BMI of 18.5 to 24.9 is normal.  A BMI of 25 to 29.9 is considered overweight.  A BMI of 30 and above is considered obese.  Watch levels of cholesterol and blood lipids  You should start having your blood tested for lipids and cholesterol at 69 years of age, then have this test every 5 years.  You may need to have your cholesterol levels checked more often if:  Your lipid or cholesterol levels are high.  You are older than 69 years of age.  You are at high risk for heart disease.  CANCER SCREENING   Lung Cancer  Lung cancer  screening is recommended for adults 70-77 years old who are at high risk for lung cancer because of a history of smoking.  A yearly low-dose CT scan of the lungs is recommended for people who:  Currently smoke.  Have quit within the past 15 years.  Have at least a 30-pack-year history of smoking. A pack year is smoking an average of one pack of cigarettes a day for 1 year.  Yearly screening should continue until it has been 15 years since you quit.  Yearly screening should stop if you develop a health problem that would prevent you from having lung cancer treatment.  Breast Cancer  Practice breast self-awareness. This means understanding how your breasts normally appear and feel.  It also means doing regular breast self-exams. Let your health care provider know about any changes, no matter how small.  If you are in your 20s or 30s, you should have a clinical breast exam (CBE) by a health care provider every 1-3 years as part of a regular health exam.  If you are 36 or older, have a CBE every year. Also consider having a breast X-ray (mammogram) every year.  If you have a family history of breast cancer, talk to your health care provider about genetic screening.  If  you are at high risk for breast cancer, talk to your health care provider about having an MRI and a mammogram every year.  Breast cancer gene (BRCA) assessment is recommended for women who have family members with BRCA-related cancers. BRCA-related cancers include:  Breast.  Ovarian.  Tubal.  Peritoneal cancers.  Results of the assessment will determine the need for genetic counseling and BRCA1 and BRCA2 testing. Cervical Cancer Your health care provider may recommend that you be screened regularly for cancer of the pelvic organs (ovaries, uterus, and vagina). This screening involves a pelvic examination, including checking for microscopic changes to the surface of your cervix (Pap test). You may be encouraged to  have this screening done every 3 years, beginning at age 32.  For women ages 55-65, health care providers may recommend pelvic exams and Pap testing every 3 years, or they may recommend the Pap and pelvic exam, combined with testing for human papilloma virus (HPV), every 5 years. Some types of HPV increase your risk of cervical cancer. Testing for HPV may also be done on women of any age with unclear Pap test results.  Other health care providers may not recommend any screening for nonpregnant women who are considered low risk for pelvic cancer and who do not have symptoms. Ask your health care provider if a screening pelvic exam is right for you.  If you have had past treatment for cervical cancer or a condition that could lead to cancer, you need Pap tests and screening for cancer for at least 20 years after your treatment. If Pap tests have been discontinued, your risk factors (such as having a new sexual partner) need to be reassessed to determine if screening should resume. Some women have medical problems that increase the chance of getting cervical cancer. In these cases, your health care provider may recommend more frequent screening and Pap tests. Colorectal Cancer  This type of cancer can be detected and often prevented.  Routine colorectal cancer screening usually begins at 69 years of age and continues through 69 years of age.  Your health care provider may recommend screening at an earlier age if you have risk factors for colon cancer.  Your health care provider may also recommend using home test kits to check for hidden blood in the stool.  A small camera at the end of a tube can be used to examine your colon directly (sigmoidoscopy or colonoscopy). This is done to check for the earliest forms of colorectal cancer.  Routine screening usually begins at age 7.  Direct examination of the colon should be repeated every 5-10 years through 69 years of age. However, you may need to be  screened more often if early forms of precancerous polyps or small growths are found. Skin Cancer  Check your skin from head to toe regularly.  Tell your health care provider about any new moles or changes in moles, especially if there is a change in a mole's shape or color.  Also tell your health care provider if you have a mole that is larger than the size of a pencil eraser.  Always use sunscreen. Apply sunscreen liberally and repeatedly throughout the day.  Protect yourself by wearing long sleeves, pants, a wide-brimmed hat, and sunglasses whenever you are outside. HEART DISEASE, DIABETES, AND HIGH BLOOD PRESSURE   High blood pressure causes heart disease and increases the risk of stroke. High blood pressure is more likely to develop in:  People who have blood pressure in the high  end of the normal range (130-139/85-89 mm Hg).  People who are overweight or obese.  People who are African American.  If you are 58-69 years of age, have your blood pressure checked every 3-5 years. If you are 65 years of age or older, have your blood pressure checked every year. You should have your blood pressure measured twice--once when you are at a hospital or clinic, and once when you are not at a hospital or clinic. Record the average of the two measurements. To check your blood pressure when you are not at a hospital or clinic, you can use:  An automated blood pressure machine at a pharmacy.  A home blood pressure monitor.  If you are between 78 years and 75 years old, ask your health care provider if you should take aspirin to prevent strokes.  Have regular diabetes screenings. This involves taking a blood sample to check your fasting blood sugar level.  If you are at a normal weight and have a low risk for diabetes, have this test once every three years after 69 years of age.  If you are overweight and have a high risk for diabetes, consider being tested at a younger age or more  often. PREVENTING INFECTION  Hepatitis B  If you have a higher risk for hepatitis B, you should be screened for this virus. You are considered at high risk for hepatitis B if:  You were born in a country where hepatitis B is common. Ask your health care provider which countries are considered high risk.  Your parents were born in a high-risk country, and you have not been immunized against hepatitis B (hepatitis B vaccine).  You have HIV or AIDS.  You use needles to inject street drugs.  You live with someone who has hepatitis B.  You have had sex with someone who has hepatitis B.  You get hemodialysis treatment.  You take certain medicines for conditions, including cancer, organ transplantation, and autoimmune conditions. Hepatitis C  Blood testing is recommended for:  Everyone born from [birth year] through 1964.  Anyone with known risk factors for hepatitis C. Sexually transmitted infections (STIs)  You should be screened for sexually transmitted infections (STIs) including gonorrhea and chlamydia if:  You are sexually active and are younger than 69 years of age.  You are older than 69 years of age and your health care provider tells you that you are at risk for this type of infection.  Your sexual activity has changed since you were last screened and you are at an increased risk for chlamydia or gonorrhea. Ask your health care provider if you are at risk.  If you do not have HIV, but are at risk, it may be recommended that you take a prescription medicine daily to prevent HIV infection. This is called pre-exposure prophylaxis (PrEP). You are considered at risk if:  You are sexually active and do not regularly use condoms or know the HIV status of your partner(s).  You take drugs by injection.  You are sexually active with a partner who has HIV. Talk with your health care provider about whether you are at high risk of being infected with HIV. If you choose to begin PrEP, you  should first be tested for HIV. You should then be tested every 3 months for as long as you are taking PrEP.  PREGNANCY   If you are premenopausal and you may become pregnant, ask your health care provider about preconception counseling.  If you  may become pregnant, take 400 to 800 micrograms (mcg) of folic acid every day.  If you want to prevent pregnancy, talk to your health care provider about birth control (contraception). OSTEOPOROSIS AND MENOPAUSE   Osteoporosis is a disease in which the bones lose minerals and strength with aging. This can result in serious bone fractures. Your risk for osteoporosis can be identified using a bone density scan.  If you are 30 years of age or older, or if you are at risk for osteoporosis and fractures, ask your health care provider if you should be screened.  Ask your health care provider whether you should take a calcium or vitamin D supplement to lower your risk for osteoporosis.  Menopause may have certain physical symptoms and risks.  Hormone replacement therapy may reduce some of these symptoms and risks. Talk to your health care provider about whether hormone replacement therapy is right for you.  HOME CARE INSTRUCTIONS   Schedule regular health, dental, and eye exams.  Stay current with your immunizations.   Do not use any tobacco products including cigarettes, chewing tobacco, or electronic cigarettes.  If you are pregnant, do not drink alcohol.  If you are breastfeeding, limit how much and how often you drink alcohol.  Limit alcohol intake to no more than 1 drink per day for nonpregnant women. One drink equals 12 ounces of beer, 5 ounces of wine, or 1 ounces of hard liquor.  Do not use street drugs.  Do not share needles.  Ask your health care provider for help if you need support or information about quitting drugs.  Tell your health care provider if you often feel depressed.  Tell your health care provider if you have ever  been abused or do not feel safe at home.   This information is not intended to replace advice given to you by your health care provider. Make sure you discuss any questions you have with your health care provider.   Document Released: 04/06/2011 Document Revised: 10/12/2014 Document Reviewed: 08/23/2013 Elsevier Interactive Patient Education [employer].

## 2015-11-26 ENCOUNTER — Other Ambulatory Visit (INDEPENDENT_AMBULATORY_CARE_PROVIDER_SITE_OTHER): Payer: Medicare Other

## 2015-11-26 DIAGNOSIS — E785 Hyperlipidemia, unspecified: Secondary | ICD-10-CM | POA: Diagnosis not present

## 2015-11-26 DIAGNOSIS — E559 Vitamin D deficiency, unspecified: Secondary | ICD-10-CM

## 2015-11-26 DIAGNOSIS — Z1159 Encounter for screening for other viral diseases: Secondary | ICD-10-CM | POA: Diagnosis not present

## 2015-11-26 LAB — CBC
HEMATOCRIT: 39.8 % (ref 36.0–46.0)
Hemoglobin: 13.1 g/dL (ref 12.0–15.0)
MCHC: 33 g/dL (ref 30.0–36.0)
MCV: 85.9 fl (ref 78.0–100.0)
PLATELETS: 287 10*3/uL (ref 150.0–400.0)
RBC: 4.62 Mil/uL (ref 3.87–5.11)
RDW: 14.7 % (ref 11.5–15.5)
WBC: 6.5 10*3/uL (ref 4.0–10.5)

## 2015-11-26 LAB — COMPREHENSIVE METABOLIC PANEL
ALT: 18 U/L (ref 0–35)
AST: 19 U/L (ref 0–37)
Albumin: 4.3 g/dL (ref 3.5–5.2)
Alkaline Phosphatase: 78 U/L (ref 39–117)
BUN: 15 mg/dL (ref 6–23)
CHLORIDE: 102 meq/L (ref 96–112)
CO2: 30 meq/L (ref 19–32)
CREATININE: 0.7 mg/dL (ref 0.40–1.20)
Calcium: 9.5 mg/dL (ref 8.4–10.5)
GFR: 88.24 mL/min (ref >60.00)
GLUCOSE: 101 mg/dL — AB (ref 70–99)
Potassium: 4 mEq/L (ref 3.5–5.1)
SODIUM: 138 meq/L (ref 135–145)
Total Bilirubin: 0.8 mg/dL (ref 0.2–1.2)
Total Protein: 7.1 g/dL (ref 6.0–8.3)

## 2015-11-26 LAB — LIPID PANEL
CHOL/HDL RATIO: 5
Cholesterol: 287 mg/dL — ABNORMAL HIGH (ref 0–200)
HDL: 53.5 mg/dL (ref >39.00)
LDL CALC: 208 mg/dL — AB (ref 0–99)
NONHDL: 233.81
Triglycerides: 129 mg/dL (ref 0.0–149.0)
VLDL: 25.8 mg/dL (ref 0.0–40.0)

## 2015-11-27 LAB — HEPATITIS C ANTIBODY: HCV AB: NEGATIVE

## 2015-11-28 ENCOUNTER — Encounter: Payer: Self-pay | Admitting: Family

## 2015-11-29 LAB — VITAMIN D 1,25 DIHYDROXY
VITAMIN D 1, 25 (OH) TOTAL: 85 pg/mL — AB (ref 18–72)
VITAMIN D3 1, 25 (OH): 85 pg/mL
Vitamin D2 1, 25 (OH)2: <8 pg/mL

## 2015-11-29 NOTE — Telephone Encounter (Signed)
Rec'd a call from Ms. Patricia Murillo; Had at [facility] x 4 weeks; now going on 6 weeks with cough; coughing up clear sputum; No fever; no sore throat; still has some reflux going on secondary to [facility]; The patient was told to schedule apt if cough did not improve; States she feels like cough medicine may help and doesn't feel she needs an apt. Has no opinion as to what medicine may help.

## 2015-12-01 ENCOUNTER — Other Ambulatory Visit: Payer: Self-pay | Admitting: Family

## 2015-12-01 ENCOUNTER — Encounter: Payer: Self-pay | Admitting: Family

## 2015-12-01 MED ORDER — BENZONATATE 100 MG PO CAPS: 100.0000 mg | ORAL_CAPSULE | Freq: Two times a day (BID) | ORAL | Status: DC | PRN

## 2016-01-06 ENCOUNTER — Ambulatory Visit (INDEPENDENT_AMBULATORY_CARE_PROVIDER_SITE_OTHER)
Admission: RE | Admit: 2016-01-06 | Discharge: 2016-01-06 | Disposition: A | Discharge status: Home / Self Care | Payer: Medicare Other | Source: Ambulatory Visit | Attending: Family | Admitting: Family

## 2016-01-06 DIAGNOSIS — Z780 Asymptomatic menopausal state: Secondary | ICD-10-CM

## 2016-01-12 ENCOUNTER — Encounter: Payer: Self-pay | Admitting: Family

## 2016-02-03 ENCOUNTER — Other Ambulatory Visit: Payer: Self-pay | Admitting: Family

## 2016-02-04 ENCOUNTER — Other Ambulatory Visit: Payer: Self-pay | Admitting: Family

## 2016-05-05 ENCOUNTER — Telehealth: Payer: Self-pay | Admitting: Emergency Medicine

## 2016-05-05 NOTE — Telephone Encounter (Signed)
Mrs. 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  She is taking tylenol as directed and it is not helping.  Tks

## 2016-05-05 NOTE — Telephone Encounter (Signed)
Pt called and has questions about a medication. She asked that you call her back. Thanks.

## 2016-05-05 NOTE — Telephone Encounter (Signed)
Please advise 

## 2016-05-05 NOTE — Telephone Encounter (Signed)
Does she continue to take over the counter ibuprofen?

## 2016-05-06 MED ORDER — TRAMADOL HCL 50 MG PO TABS: 50.0000 mg | ORAL_TABLET | Freq: Three times a day (TID) | ORAL | 0 refills | Status: DC | PRN

## 2016-05-06 NOTE — Telephone Encounter (Signed)
Pt called and stated the ibuprofen isnt working and still needs something for her arthritis Please follow up thanks.

## 2016-05-06 NOTE — Telephone Encounter (Signed)
Tramadol printed and will be faxed.

## 2016-05-06 NOTE — Telephone Encounter (Signed)
Please advise 

## 2016-05-06 NOTE — Telephone Encounter (Signed)
Rx sent. Pt aware.  

## 2016-05-26 ENCOUNTER — Other Ambulatory Visit: Payer: Self-pay | Admitting: Family

## 2016-06-01 ENCOUNTER — Ambulatory Visit (INDEPENDENT_AMBULATORY_CARE_PROVIDER_SITE_OTHER): Payer: Medicare Other | Admitting: Family

## 2016-06-01 ENCOUNTER — Encounter: Payer: Self-pay | Admitting: Family

## 2016-06-01 VITALS — BP 118/88 | HR 83 | Temp 98.3°F | Resp 16 | Ht 67.0 in | Wt 244.0 lb

## 2016-06-01 DIAGNOSIS — M129 Arthropathy, unspecified: Secondary | ICD-10-CM | POA: Insufficient documentation

## 2016-06-01 DIAGNOSIS — Z23 Encounter for immunization: Secondary | ICD-10-CM | POA: Diagnosis not present

## 2016-06-01 DIAGNOSIS — K219 Gastro-esophageal reflux disease without esophagitis: Secondary | ICD-10-CM | POA: Diagnosis not present

## 2016-06-01 MED ORDER — RANITIDINE HCL 150 MG PO TABS: 150.0000 mg | ORAL_TABLET | Freq: Two times a day (BID) | ORAL | 1 refills | Status: DC

## 2016-06-01 MED ORDER — TRAMADOL HCL 50 MG PO TABS: 50.0000 mg | ORAL_TABLET | Freq: Three times a day (TID) | ORAL | 0 refills | Status: DC | PRN

## 2016-06-01 NOTE — Progress Notes (Signed)
Subjective:    Patient ID: Patricia Murillo, female    DOB: Nov 10, 1945, 70 y.o.   MRN: [id number]  Chief Complaint  Patient presents with  . Medication Refill    needs refill of both medications    HPI:  Patricia Murillo is a 70 y.o. female who  has a past medical history of Arthritis and History of chicken pox. and presents today for a follow up office visit.   1.) GERD - Currently maintained on ranitidine. Reports taking the medication as prescribed and denies adverse side effects. Symptoms are generally well controlled with only one episode where it was uncontrolled.   2.)  Arthritis of multiple joints - Currently maintained on Tramadol as needed. Reports taking the medication as prescribed and denies adverse side effects or constipation. Able to help take the edge off of her symptoms and provide functionality to her life.    No Known Allergies   Outpatient Medications Prior to Visit  Medication Sig Dispense Refill  . benzonatate (TESSALON) 100 MG capsule Take 1 capsule (100 mg total) by mouth 2 (two) times daily as needed for cough. 20 capsule 0  . ibuprofen (ADVIL,MOTRIN) 200 MG tablet Take 200 mg by mouth every 6 (six) hours as needed for mild pain.    . ranitidine (ZANTAC) 150 MG tablet TAKE ONE TABLET BY MOUTH TWICE DAILY 60 tablet 0  . traMADol (ULTRAM) 50 MG tablet Take 1 tablet (50 mg total) by mouth every 8 (eight) hours as needed. 30 tablet 0  . ranitidine (ZANTAC) 150 MG tablet TAKE ONE TABLET BY MOUTH TWICE DAILY 60 tablet 0   No facility-administered medications prior to visit.     No past surgical history on file.   Review of Systems  Gastrointestinal: Negative for abdominal pain, blood in stool, constipation, diarrhea, nausea and vomiting.  Musculoskeletal: Positive for arthralgias.  Neurological: Negative for weakness and numbness.      Objective:    BP 118/88 (BP Location: Left Arm, Patient Position: Sitting, Cuff Size: Large)   Pulse 83   Temp  98.3 F (36.8 C) (Oral)   Resp 16   Ht 5\' 7"  (1.702 m)   Wt 244 lb (110.7 kg)   SpO2 96%   BMI 38.22 kg/m  Nursing note and vital signs reviewed.  Physical Exam  Constitutional: She is oriented to person, place, and time. She appears well-developed and well-nourished. No distress.  Cardiovascular: Normal rate, regular rhythm, normal heart sounds and intact distal pulses.   Pulmonary/Chest: Effort normal and breath sounds normal.  Abdominal: Normal appearance and bowel sounds are normal. There is no hepatosplenomegaly. There is no tenderness. There is no rigidity, no rebound, no guarding, no tenderness at McBurney's point and negative Murphy's sign.  Neurological: She is alert and oriented to person, place, and time.  Skin: Skin is warm and dry.  Psychiatric: She has a normal mood and affect. Her behavior is normal. Judgment and thought content normal.       Assessment & Plan:   Problem List Items Addressed This Visit      Digestive   GERD (gastroesophageal reflux disease) - Primary    GERD appears adequately controlled with current regimen and no adverse side effects. Continue current dosage of ranitidine. Food choices to reduce symptoms provided and after visit summary.      Relevant Medications   ranitidine (ZANTAC) 150 MG tablet     Musculoskeletal and Integument   Arthritis involving multiple sites  Arthritis involving her knees, elbows, and wrist appear well controlled with current regimen and no adverse side effects or constipation. Describes increased functionality and able to complete activities of daily living making pain manageable. Encouraged starting 500 mg of to [employer] daily. Continue physical activity and nonpharmacological treatments. Continue current dosage of tramadol. [place] controlled substance database reviewed with no irregularities.      Relevant Medications   traMADol (ULTRAM) 50 MG tablet    Other Visit Diagnoses    Encounter for immunization        Relevant Orders   Flu Vaccine QUAD 36+ mos IM (Completed)       I have discontinued Ms. Turri's ibuprofen, benzonatate, and ranitidine. I have also changed her ranitidine. Additionally, I am having her maintain her traMADol.   Meds ordered this encounter  Medications  . ranitidine (ZANTAC) 150 MG tablet    Sig: Take 1 tablet (150 mg total) by mouth 2 (two) times daily.    Dispense:  180 tablet    Refill:  1    Order Specific Question:   Supervising Provider    Answer:   Pricilla Holm A [id number]  . traMADol (ULTRAM) 50 MG tablet    Sig: Take 1 tablet (50 mg total) by mouth every 8 (eight) hours as needed.    Dispense:  90 tablet    Refill:  0    Order Specific Question:   Supervising Provider    Answer:   Pricilla Holm A [id number]     Follow-up: Return in about 6 months (around 12/02/2016), or if symptoms worsen or fail to improve.  Mauricio Po, FNP

## 2016-06-01 NOTE — Assessment & Plan Note (Signed)
GERD appears adequately controlled with current regimen and no adverse side effects. Continue current dosage of ranitidine. Food choices to reduce symptoms provided and after visit summary.

## 2016-06-01 NOTE — Assessment & Plan Note (Signed)
Arthritis involving her knees, elbows, and wrist appear well controlled with current regimen and no adverse side effects or constipation. Describes increased functionality and able to complete activities of daily living making pain manageable. Encouraged starting 500 mg of to [employer] daily. Continue physical activity and nonpharmacological treatments. Continue current dosage of tramadol. [place] controlled substance database reviewed with no irregularities.

## 2016-06-01 NOTE — Patient Instructions (Addendum)
Thank you for choosing [facility].  SUMMARY AND INSTRUCTIONS:  Medication:  Consider 500 mg of Tumeric daily.   Continue to take your medications as prescribed.   Your prescription(s) have been submitted to your pharmacy or been printed and provided for you. Please take as directed and contact our office if you believe you are having problem(s) with the medication(s) or have any questions.  Follow up:  If your symptoms worsen or fail to improve, please contact our office for further instruction, or in case of emergency go directly to the emergency room at the closest medical facility.   Food Choices for Gastroesophageal Reflux Disease, Adult When you have gastroesophageal reflux disease (GERD), the foods you eat and your eating habits are very important. Choosing the right foods can help ease the discomfort of GERD. WHAT GENERAL GUIDELINES DO I NEED TO FOLLOW?  Choose fruits, vegetables, whole grains, low-fat dairy products, and low-fat meat, fish, and poultry.  Limit fats such as oils, salad dressings, butter, nuts, and avocado.  Keep a food diary to identify foods that cause symptoms.  Avoid foods that cause reflux. These may be different for different people.  Eat frequent small meals instead of three large meals each day.  Eat your meals slowly, in a relaxed setting.  Limit fried foods.  Cook foods using methods other than frying.  Avoid drinking alcohol.  Avoid drinking large amounts of liquids with your meals.  Avoid bending over or lying down until 2-3 hours after eating. WHAT FOODS ARE NOT RECOMMENDED? The following are some foods and drinks that may worsen your symptoms: Vegetables Tomatoes. Tomato juice. Tomato and spaghetti sauce. Chili peppers. Onion and garlic. Horseradish. Fruits Oranges, grapefruit, and lemon (fruit and juice). Meats High-fat meats, fish, and poultry. This includes hot dogs, ribs, ham, sausage, salami, and bacon. Dairy Whole  milk and chocolate milk. Sour cream. Cream. Butter. Ice cream. Cream cheese.  Beverages Coffee and tea, with or without caffeine. Carbonated beverages or energy drinks. Condiments Hot sauce. Barbecue sauce.  Sweets/Desserts Chocolate and cocoa. Donuts. Peppermint and spearmint. Fats and Oils High-fat foods, including [place] fries and potato chips. Other Vinegar. Strong spices, such as black pepper, white pepper, red pepper, cayenne, curry powder, cloves, ginger, and chili powder. The items listed above may not be a complete list of foods and beverages to avoid. Contact your dietitian for more information.   This information is not intended to replace advice given to you by your health care provider. Make sure you discuss any questions you have with your health care provider.   Document Released: 09/21/2005 Document Revised: 10/12/2014 Document Reviewed: 07/26/2013 Elsevier Interactive Patient Education [employer].

## 2016-08-25 ENCOUNTER — Other Ambulatory Visit: Payer: Self-pay

## 2016-08-25 DIAGNOSIS — M129 Arthropathy, unspecified: Secondary | ICD-10-CM

## 2016-08-25 NOTE — Telephone Encounter (Signed)
Pt called requesting a refill of Tramadol, please advise. Thanks!

## 2016-08-26 MED ORDER — TRAMADOL HCL 50 MG PO TABS: 50.0000 mg | ORAL_TABLET | Freq: Three times a day (TID) | ORAL | 0 refills | Status: DC | PRN

## 2016-08-26 NOTE — Telephone Encounter (Signed)
Rx refill

## 2016-08-26 NOTE — Telephone Encounter (Signed)
Medication has been refilled.

## 2016-08-31 ENCOUNTER — Ambulatory Visit (INDEPENDENT_AMBULATORY_CARE_PROVIDER_SITE_OTHER): Payer: Medicare Other | Admitting: Family

## 2016-08-31 ENCOUNTER — Ambulatory Visit (INDEPENDENT_AMBULATORY_CARE_PROVIDER_SITE_OTHER)
Admission: RE | Admit: 2016-08-31 | Discharge: 2016-08-31 | Disposition: A | Discharge status: Home / Self Care | Payer: Medicare Other | Source: Ambulatory Visit | Attending: Family | Admitting: Family

## 2016-08-31 ENCOUNTER — Encounter: Payer: Self-pay | Admitting: Family

## 2016-08-31 DIAGNOSIS — M25571 Pain in right ankle and joints of right foot: Secondary | ICD-10-CM | POA: Diagnosis not present

## 2016-08-31 DIAGNOSIS — M19071 Primary osteoarthritis, right ankle and foot: Secondary | ICD-10-CM | POA: Diagnosis not present

## 2016-08-31 NOTE — Progress Notes (Signed)
Subjective:    Patient ID: Patricia Murillo, female    DOB: 03/06/1946, 70 y.o.   MRN: [id number]  Chief Complaint  Patient presents with  . Foot Pain    right foot pain x1 week, got worse towards the end of last week, was working in the yard and from then she felt a discomfort in the heel and started swelling     HPI:  Patricia Murillo is a 70 y.o. female who  has a past medical history of Arthritis and History of chicken pox. and presents today for an office visit.  This is a new problem. Associated symptom of pain located in her right foot has been going on for about 1 week following working in the yard and feeling a discomfort around her heel. Denies trauma or injury. No sounds/sensations heard or felt. Described a burning sensation on the bottom of her heel at the time. Pain is described as sharp. Previous history of surgery with plates and screws greater than 20 years ago following a fall. First step in the morning is very painful and may improve slightly with walking. Modifying factors include arthritis cream, ice and heat which have not helped. Does have some numbness and tingling located on the bottom of her foot.    No Known Allergies    Outpatient Medications Prior to Visit  Medication Sig Dispense Refill  . ranitidine (ZANTAC) 150 MG tablet Take 1 tablet (150 mg total) by mouth 2 (two) times daily. 180 tablet 1  . traMADol (ULTRAM) 50 MG tablet Take 1 tablet (50 mg total) by mouth every 8 (eight) hours as needed. 90 tablet 0   No facility-administered medications prior to visit.       Past Surgical History:  Procedure Laterality Date  . ANKLE SURGERY      Past Medical History:  Diagnosis Date  . Arthritis   . History of chicken pox     Review of Systems  Constitutional: Negative for chills and fever.  Musculoskeletal:       Positive for right ankle pain.  Neurological: Negative for weakness and numbness.      Objective:    BP (!) 144/88 (BP  Location: Left Arm, Patient Position: Sitting, Cuff Size: Large)   Pulse 93   Temp 98 F (36.7 C) (Oral)   Resp 16   Ht 5\' 7"  (1.702 m)   Wt 221 lb (100.2 kg)   SpO2 97%   BMI 34.61 kg/m  Nursing note and vital signs reviewed.  Physical Exam  Constitutional: She is oriented to person, place, and time. She appears well-developed and well-nourished. No distress.  Cardiovascular: Normal rate, regular rhythm, normal heart sounds and intact distal pulses.   Pulmonary/Chest: Effort normal and breath sounds normal.  Musculoskeletal:  Right ankle - obvious edema and postsurgical scar noted. No discoloration or deformity. There is generalized tenderness over the medial and lateral ankle. Range of motion limited secondary to edema. Distal pulses and sensation are intact and appropriate. Ligamentous testing deferred secondary to edema.  Neurological: She is alert and oriented to person, place, and time.  Skin: Skin is warm and dry.  Psychiatric: She has a normal mood and affect. Her behavior is normal. Judgment and thought content normal.       Assessment & Plan:   Problem List Items Addressed This Visit      Other   Right ankle pain    Right ankle pain status post surgical repair approximately 20 years  ago with no specific trauma or injury that she can recall. Obtain x-rays. Treat conservatively with ice, compression, elevation, and Aircast. Start home exercise therapy. Follow-up pending x-ray results for formal physical therapy or possible further mobilization as needed.      Relevant Orders   DG Ankle Complete Right (Completed)       I am having Ms. Tamblyn maintain her ranitidine and traMADol.   Follow-up: Return in about 2 weeks (around 09/14/2016), or if symptoms worsen or fail to improve.  Mauricio Po, FNP

## 2016-08-31 NOTE — Patient Instructions (Signed)
Thank you for choosing [facility]!  Ice / moist heat x 20 minutes every 2 hours and as needed or following activity  Elevate your leg.  Compression wrap for swelling.  Aircast for support.   Exercises 1-2 times per day as instructed.   You will receive a call from [facility] regarding your Pennsaid/Duexis/Vimovo. The medication will be mailed to you and should cost you no more than $10 per item or possibly free depending upon your insurance.   You will receive a call to schedule your imaging, appointment or physical therapy.  Please stop by radiology on the basement level of the building for your x-rays. Your results will be released to [place] [facility] (or called to you) after review, usually within 72 hours after test completion. If any treatments or changes are necessary, you will be notified at that same time.  If your symptoms worsen or fail to improve, please contact our office for further instruction, or in case of emergency go directly to the emergency room at the closest medical facility.    Ankle Sprain, Phase I Rehab Ask your health care provider which exercises are safe for you. Do exercises exactly as told by your health care provider and adjust them as directed. It is normal to feel mild stretching, pulling, tightness, or discomfort as you do these exercises, but you should stop right away if you feel sudden pain or your pain gets worse.Do not begin these exercises until told by your health care provider. Stretching and range of motion exercises These exercises warm up your muscles and joints and improve the movement and flexibility of your lower leg and ankle. These exercises also help to relieve pain and stiffness. Exercise A: Gastroc and soleus stretch 1. Sit on the floor with your left / right leg extended. 2. Loop a belt or towel around the ball of your left / right foot. The ball of your foot is on the walking surface, right under your toes. 3. Keep your left /  right ankle and foot relaxed and keep your knee straight while you use the belt or towel to pull your foot toward you. You should feel a gentle stretch behind your calf or knee. 4. Hold this position for __________ seconds, then release to the starting position. Repeat the exercise with your knee bent. You can put a pillow or a rolled bath towel under your knee to support it. You should feel a stretch deep in your calf or at your Achilles tendon. Repeat each stretch __________ times. Complete these stretches __________ times a day. Exercise B: Ankle alphabet 1. Sit with your left / right leg supported at the lower leg.  Do not rest your foot on anything.  Make sure your foot has room to move freely. 2. Think of your left / right foot as a paintbrush, and move your foot to trace each letter of the alphabet in the air. Keep your hip and knee still while you trace. Make the letters as large as you can without feeling discomfort. 3. Trace every letter from A to Z. Repeat __________ times. Complete this exercise __________ times a day. Strengthening exercises These exercises build strength and endurance in your ankle and lower leg. Endurance is the ability to use your muscles for a long time, even after they get tired. Exercise C: Dorsiflexors 1. Secure a rubber exercise band or tube to an object, such as a table leg, that will stay still when the band is pulled. Secure the other  end around your left / right foot. 2. Sit on the floor facing the object, with your left / right leg extended. The band or tube should be slightly tense when your foot is relaxed. 3. Slowly bring your foot toward you, pulling the band tighter. 4. Hold this position for __________ seconds. 5. Slowly return your foot to the starting position. Repeat __________ times. Complete this exercise __________ times a day. Exercise D: Plantar flexors 1. Sit on the floor with your left / right leg extended. 2. Loop a rubber exercise  tube or band around the ball of your left / right foot. The ball of your foot is on the walking surface, right under your toes.  Hold the ends of the band or tube in your hands.  The band or tube should be slightly tense when your foot is relaxed. 3. Slowly point your foot and toes downward, pushing them away from you. 4. Hold this position for __________ seconds. 5. Slowly return your foot to the starting position. Repeat __________ times. Complete this exercise __________ times a day. Exercise E: Evertors 1. Sit on the floor with your legs straight out in front of you. 2. Loop a rubber exercise band or tube around the ball of your left / right foot. The ball of your foot is on the walking surface, right under your toes.  Hold the ends of the band in your hands, or secure the band to a stable object.  The band or tube should be slightly tense when your foot is relaxed. 3. Slowly push your foot outward, away from your other leg. 4. Hold this position for __________ seconds. 5. Slowly return your foot to the starting position. Repeat __________ times. Complete this exercise __________ times a day. This information is not intended to replace advice given to you by your health care provider. Make sure you discuss any questions you have with your health care provider. Document Released: 04/22/2005 Document Revised: 05/28/2016 Document Reviewed: 08/05/2015 Elsevier Interactive Patient Education  2017 [employer].

## 2016-08-31 NOTE — Assessment & Plan Note (Signed)
Right ankle pain status post surgical repair approximately 20 years ago with no specific trauma or injury that she can recall. Obtain x-rays. Treat conservatively with ice, compression, elevation, and Aircast. Start home exercise therapy. Follow-up pending x-ray results for formal physical therapy or possible further mobilization as needed.

## 2016-09-05 ENCOUNTER — Encounter: Payer: Self-pay | Admitting: Family

## 2016-09-19 ENCOUNTER — Encounter: Payer: Self-pay | Admitting: Family

## 2016-09-20 ENCOUNTER — Encounter: Payer: Self-pay | Admitting: Family

## 2016-10-12 ENCOUNTER — Encounter: Payer: Self-pay | Admitting: Family

## 2016-10-12 ENCOUNTER — Ambulatory Visit (INDEPENDENT_AMBULATORY_CARE_PROVIDER_SITE_OTHER): Payer: Medicare Other | Admitting: Family

## 2016-10-12 ENCOUNTER — Other Ambulatory Visit (INDEPENDENT_AMBULATORY_CARE_PROVIDER_SITE_OTHER): Payer: Medicare Other

## 2016-10-12 DIAGNOSIS — R21 Rash and other nonspecific skin eruption: Secondary | ICD-10-CM | POA: Diagnosis not present

## 2016-10-12 LAB — CBC WITH DIFFERENTIAL/PLATELET
BASOS PCT: 0.1 % (ref 0.0–3.0)
Basophils Absolute: 0 10*3/uL (ref 0.0–0.1)
EOS ABS: 0.3 10*3/uL (ref 0.0–0.7)
EOS PCT: 2.3 % (ref 0.0–5.0)
HEMATOCRIT: 34.5 % — AB (ref 36.0–46.0)
HEMOGLOBIN: 11.6 g/dL — AB (ref 12.0–15.0)
LYMPHS PCT: 7.9 % — AB (ref 12.0–46.0)
Lymphs Abs: 0.9 10*3/uL (ref 0.7–4.0)
MCHC: 33.5 g/dL (ref 30.0–36.0)
MCV: 82 fl (ref 78.0–100.0)
MONO ABS: 1.1 10*3/uL — AB (ref 0.1–1.0)
Monocytes Relative: 9.8 % (ref 3.0–12.0)
Neutro Abs: 9 10*3/uL — ABNORMAL HIGH (ref 1.4–7.7)
Neutrophils Relative %: 79.9 % — ABNORMAL HIGH (ref 43.0–77.0)
Platelets: 340 10*3/uL (ref 150.0–400.0)
RBC: 4.21 Mil/uL (ref 3.87–5.11)
RDW: 15.1 % (ref 11.5–15.5)
WBC: 11.3 10*3/uL — AB (ref 4.0–10.5)

## 2016-10-12 LAB — COMPREHENSIVE METABOLIC PANEL
ALT: 8 U/L (ref 0–35)
AST: 13 U/L (ref 0–37)
Albumin: 3.6 g/dL (ref 3.5–5.2)
Alkaline Phosphatase: 84 U/L (ref 39–117)
BUN: 14 mg/dL (ref 6–23)
CHLORIDE: 101 meq/L (ref 96–112)
CO2: 27 meq/L (ref 19–32)
Calcium: 9 mg/dL (ref 8.4–10.5)
Creatinine, Ser: 0.58 mg/dL (ref 0.40–1.20)
GFR: 109.34 mL/min (ref >60.00)
GLUCOSE: 93 mg/dL (ref 70–99)
POTASSIUM: 4.2 meq/L (ref 3.5–5.1)
SODIUM: 137 meq/L (ref 135–145)
TOTAL PROTEIN: 6.7 g/dL (ref 6.0–8.3)
Total Bilirubin: 1.1 mg/dL (ref 0.2–1.2)

## 2016-10-12 NOTE — Progress Notes (Signed)
Subjective:    Patient ID: Patricia Murillo, female    DOB: [birth year], 71 y.o.   MRN: [id number]  Chief Complaint  Patient presents with  . Hand Pain    bilateral hand pain, rash on both hands, states her finger tips are hurting and can barely pick up anything, x2 days    HPI:  Patricia Murillo is a 71 y.o. female who  has a past medical history of Arthritis and History of chicken pox. and presents today for an office visit.   Associated symptom of pain and decreased function located in her bilateral hands and finger tips have been going on for about 2 weeks.. Does have significant history for arthritis of multiple joints and is currently maintained on Tramadol. Pain is described as sharp when she touches something. Severity is enough to decrease her strength. Also notes a rash located on her bilateral hands primarily located on her palms that is red, and lightly itchy. Modifying factors include Biofreeze which has not helped very much. Does take Aleve which may help a little with her symptoms.   No Known Allergies    Outpatient Medications Prior to Visit  Medication Sig Dispense Refill  . ranitidine (ZANTAC) 150 MG tablet Take 1 tablet (150 mg total) by mouth 2 (two) times daily. 180 tablet 1  . traMADol (ULTRAM) 50 MG tablet Take 1 tablet (50 mg total) by mouth every 8 (eight) hours as needed. 90 tablet 0   No facility-administered medications prior to visit.      Review of Systems  Constitutional: Negative for chills and fever.  Respiratory: Negative for chest tightness and shortness of breath.   Cardiovascular: Negative for chest pain, palpitations and leg swelling.  Musculoskeletal: Positive for arthralgias.  Skin: Positive for rash.  Neurological: Positive for weakness. Negative for numbness.      Objective:    BP 122/88 (BP Location: Left Arm, Patient Position: Sitting, Cuff Size: Large)   Pulse 95   Temp 97.8 F (36.6 C) (Oral)   Resp 16   Ht 5\' 7"  (1.702  m)   Wt 216 lb (98 kg)   SpO2 98%   BMI 33.83 kg/m  Nursing note and vital signs reviewed.  Physical Exam  Constitutional: She is oriented to person, place, and time. She appears well-developed and well-nourished. No distress.  Cardiovascular: Normal rate, regular rhythm, normal heart sounds and intact distal pulses.   Pulmonary/Chest: Effort normal and breath sounds normal.  Neurological: She is alert and oriented to person, place, and time.  Skin: Skin is warm and dry. Rash (Bilateral hands with erythema that appear reddish purple in small plaque formation with defined borders) noted.  Psychiatric: She has a normal mood and affect. Her behavior is normal. Judgment and thought content normal.          Assessment & Plan:   Problem List Items Addressed This Visit      Musculoskeletal and Integument   Rash of hands    Rash of hands of undetermined origin. 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 Cannot rule out possible syphilis origin. Obtain CBC with diff, CMET and RPR. Continue previously prescribed Tramadol as needed for discomfort and continue to monitor pending blood work results.       Relevant Orders   CBC with Differential (Completed)   RPR   Comprehensive metabolic panel (Completed)      I am having Ms.  Mahlke maintain her ranitidine and traMADol.   Follow-up: Return if symptoms worsen or fail to improve.  Mauricio Po, FNP

## 2016-10-12 NOTE — Assessment & Plan Note (Signed)
Rash of hands of undetermined origin. 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 Cannot rule out possible syphilis origin. Obtain CBC with diff, CMET and RPR. Continue previously prescribed Tramadol as needed for discomfort and continue to monitor pending blood work results.

## 2016-10-12 NOTE — Patient Instructions (Signed)
Thank you for choosing [facility].  SUMMARY AND INSTRUCTIONS:  Ice / moist heat as needed.  Pennsaid - 2x per day about 1/2 pack to each hand  Epson rub as needed.  Tramadol as needed.   Medication:  Your prescription(s) have been submitted to your pharmacy or been printed and provided for you. Please take as directed and contact our office if you believe you are having problem(s) with the medication(s) or have any questions.  Labs:  Please stop by the lab on the lower level of the building for your blood work. Your results will be released to [place] (or called to you) after review, usually within 72 hours after test completion. If any changes need to be made, you will be notified at that same time.  1.) The lab is open from 7:30am to 5:30 pm Monday-Friday 2.) No appointment is necessary 3.) Fasting (if needed) is 6-8 hours after food and drink; black coffee and water are okay   Follow up:  If your symptoms worsen or fail to improve, please contact our office for further instruction, or in case of emergency go directly to the emergency room at the closest medical facility.

## 2016-10-13 ENCOUNTER — Encounter: Payer: Self-pay | Admitting: Family

## 2016-10-13 ENCOUNTER — Other Ambulatory Visit: Payer: Self-pay | Admitting: Family

## 2016-10-13 LAB — RPR

## 2016-10-13 MED ORDER — VALACYCLOVIR HCL 1 G PO TABS: 1000.0000 mg | ORAL_TABLET | Freq: Two times a day (BID) | ORAL | 0 refills | Status: DC

## 2016-10-15 ENCOUNTER — Encounter: Payer: Self-pay | Admitting: Family

## 2016-10-27 ENCOUNTER — Other Ambulatory Visit: Payer: Self-pay | Admitting: Family

## 2016-10-27 DIAGNOSIS — M129 Arthropathy, unspecified: Secondary | ICD-10-CM

## 2016-10-29 ENCOUNTER — Other Ambulatory Visit: Payer: Self-pay | Admitting: Family

## 2016-10-29 DIAGNOSIS — M129 Arthropathy, unspecified: Secondary | ICD-10-CM

## 2016-10-29 NOTE — Telephone Encounter (Signed)
Rec'd call from pt stating she sent mychart msg 1/23 to have her Tramadol fill, upset that the pharmacy haven't received. Marya Amsler is out of the office until next week. Pls advise on refill...Johny Chess

## 2016-10-29 NOTE — Telephone Encounter (Signed)
Routing to dr Ronnald Ramp, please advise in the absence of greg calone---thanks

## 2016-10-29 NOTE — Telephone Encounter (Signed)
Faxed to [facility] [phone]

## 2016-11-16 NOTE — Progress Notes (Deleted)
Subjective:   Patricia Murillo is a 71 y.o. female who presents for Medicare Annual (Subsequent) preventive examination.  Review of Systems:  No ROS.  Medicare Wellness Visit.    Sleep patterns: {SX; SLEEP PATTERNS:18802::"feels rested on waking","does not get up to void","gets up *** times nightly to void","sleeps *** hours nightly"}.   Home Safety/Smoke Alarms:   Living environment; residence and Firearm Safety: {Rehab home environment / accessibility:30080::"no firearms","firearms stored safely"}. Seat Belt Safety/Bike Helmet: Wears seat belt.   Counseling:   Eye Exam-  Dental-  Female:   Pap-       Mammo- Last 03/13/15, BI-RADS CATEGORY 1: Negative.    Dexa scan-   Last 01/06/16, fracture risk moderate, Recommend optimizing calcium (1200 mg/day) and vitamin D (800 IU/day) intake per result report.      CCS- Last 10/05/06, no results found in chart      Objective:     Vitals: There were no vitals taken for this visit.  There is no height or weight on file to calculate BMI.   Tobacco History  Smoking Status  . Former Smoker  . Quit date: 10/05/2001  Smokeless Tobacco  . Never Used     Counseling given: Not Answered   Past Medical History:  Diagnosis Date  . Arthritis   . History of chicken pox    Past Surgical History:  Procedure Laterality Date  . ANKLE SURGERY     Family History  Problem Relation Age of Onset  . Hypertension Mother   . Arthritis Mother   . Arthritis Father   . Cancer Sister     Breast Cancer  . Heart disease Paternal Uncle    History  Sexual Activity  . Sexual activity: Not Currently    Outpatient Encounter Prescriptions as of 11/17/2016  Medication Sig  . ranitidine (ZANTAC) 150 MG tablet Take 1 tablet (150 mg total) by mouth 2 (two) times daily.  . traMADol (ULTRAM) 50 MG tablet TAKE ONE TABLET BY MOUTH EVERY 8 HOURS AS NEEDED  . valACYclovir (VALTREX) 1000 MG tablet Take 1 tablet (1,000 mg total) by mouth 2 (two) times daily.     No facility-administered encounter medications on file as of 11/17/2016.     Activities of Daily Living In your present state of health, do you have any difficulty performing the following activities: 11/18/2015  Hearing? N  Vision? N  Difficulty concentrating or making decisions? N  Walking or climbing stairs? N  Dressing or bathing? N  Doing errands, shopping? N  Preparing Food and eating ? N  Using the Toilet? N  In the past six months, have you accidently leaked urine? N  Do you have problems with loss of bowel control? N  Managing your Medications? N  Managing your Finances? N  Housekeeping or managing your Housekeeping? N  Some recent data might be hidden    Patient Care Team: Golden Circle, FNP as PCP - General (Family Medicine)    Assessment:    Physical assessment deferred to PCP.  Exercise Activities and Dietary recommendations   Diet (meal preparation, eat out, water intake, caffeinated beverages, dairy products, fruits and vegetables): {Desc; diets:16563} Breakfast: Lunch:  Dinner:      Goals    . Weight < 180 lb (81.647 kg)          The last time you weight at goal; you were walking; had a buddy; walked around the parking deck or the park on weekends;  Can go to  country park; 5 min from home; Motivation 5; Go for it!!/ Benefits; healthier; had more energy; just feels better;     . Weight < 200 lb (90.719 kg)      Fall Risk Fall Risk  11/18/2015 09/08/2012  Falls in the past year? No No   Depression Screen PHQ 2/9 Scores 11/18/2015 09/08/2012  PHQ - 2 Score 0 0     Cognitive Function        Immunization History  Administered Date(s) Administered  . Influenza,inj,Quad PF,36+ Mos 06/01/2016  . Influenza-Unspecified 08/05/2012  . Pneumococcal Polysaccharide-23 09/08/2012  . Tdap 10/06/2003   Screening Tests Health Maintenance  Topic Date Due  . ZOSTAVAX  02/12/2007  . PNA vac Low Risk Adult (2 of 2 - PCV13) 09/08/2013  . TETANUS/TDAP   10/05/2013  . COLONOSCOPY  10/05/2016  . MAMMOGRAM  03/12/2017  . INFLUENZA VACCINE  Completed  . DEXA SCAN  Completed  . Hepatitis C Screening  Completed      Plan:   *** During the course of the visit the patient was educated and counseled about the following appropriate screening and preventive services:   Vaccines to include Pneumoccal, Influenza, Hepatitis B, Td, Zostavax, HCV  Electrocardiogram  Cardiovascular Disease  Colorectal cancer screening  Bone density screening  Diabetes screening  Glaucoma screening  Mammography/PAP  Nutrition counseling   Patient Instructions (the written plan) was given to the patient.   Michiel Cowboy, RN  11/16/2016

## 2016-11-16 NOTE — Progress Notes (Deleted)
Pre visit review using our clinic review tool, if applicable. No additional management support is needed unless otherwise documented below in the visit note. 

## 2016-11-17 ENCOUNTER — Ambulatory Visit: Payer: Medicare Other

## 2016-12-08 NOTE — Progress Notes (Signed)
Pre visit review using our clinic review tool, if applicable. No additional management support is needed unless otherwise documented below in the visit note. 

## 2016-12-08 NOTE — Progress Notes (Addendum)
Subjective:   Patricia Murillo is a 70 y.o. female who presents for Medicare Annual (Subsequent) preventive examination.  Review of Systems: No ROS.  Medicare Wellness Visit.  Cardiac Risk Factors include: dyslipidemia;advanced age (>52men, >32 women);family history of premature cardiovascular disease  Sleep patterns: no sleep issues, feels rested on waking, gets up 1-2 times nightly to void and sleeps 7-8 hours nightly.   Home Safety/Smoke Alarms:  Feels safe in home. Smoke alarms in place.   Living environment; residence and Firearm Safety: 1-story house/ trailer, no firearms. Lives alone, patient is independent and also has support from family. Seat Belt Safety/Bike Helmet: Wears seat belt.   Counseling:   Eye Exam- Last 2 years, patient states she will make an appnt. Dental- Last 2 years, patient states she will make an appnt.   Female:   Pap-  N/A     Mammo- Last 03/13/15, BI-RADS CATEGORY  1: Negative, referral placed today     Dexa scan- Last 01/06/16,  Moderate fracture risk     CCS- Last 10/05/06, recall 10 years      Objective:     Vitals: BP (!) 152/90   Pulse 68   Resp 18   Ht 5\' 7"  (1.702 m)   Wt 216 lb (98 kg)   SpO2 99%   BMI 33.83 kg/m   Body mass index is 33.83 kg/m.   Tobacco History  Smoking Status  . Former Smoker  . Quit date: 10/05/2001  Smokeless Tobacco  . Never Used     Counseling given: Not Answered   Past Medical History:  Diagnosis Date  . Arthritis   . History of chicken pox   . Hyperlipidemia    Past Surgical History:  Procedure Laterality Date  . ANKLE SURGERY     Family History  Problem Relation Age of Onset  . Hypertension Mother   . Arthritis Mother   . Arthritis Father   . Cancer Sister     Breast Cancer  . Heart disease Paternal Uncle    History  Sexual Activity  . Sexual activity: Not Currently    Outpatient Encounter Prescriptions as of 12/09/2016  Medication Sig  . naproxen sodium (ANAPROX) 220 MG tablet  Take 220 mg by mouth 2 (two) times daily with a meal.  . ranitidine (ZANTAC) 150 MG tablet Take 1 tablet (150 mg total) by mouth 2 (two) times daily.  . traMADol (ULTRAM) 50 MG tablet TAKE ONE TABLET BY MOUTH EVERY 8 HOURS AS NEEDED  . [DISCONTINUED] valACYclovir (VALTREX) 1000 MG tablet Take 1 tablet (1,000 mg total) by mouth 2 (two) times daily. (Patient not taking: Reported on 12/09/2016)   No facility-administered encounter medications on file as of 12/09/2016.     Activities of Daily Living In your present state of health, do you have any difficulty performing the following activities: 12/09/2016  Hearing? N  Vision? N  Difficulty concentrating or making decisions? N  Walking or climbing stairs? N  Dressing or bathing? N  Doing errands, shopping? N  Preparing Food and eating ? N  Using the Toilet? N  In the past six months, have you accidently leaked urine? N  Do you have problems with loss of bowel control? N  Managing your Medications? N  Managing your Finances? N  Housekeeping or managing your Housekeeping? N  Some recent data might be hidden    Patient Care Team: Golden Circle, FNP as PCP - General (Family Medicine)    Assessment:  Physical assessment deferred to PCP.  Exercise Activities and Dietary recommendations Current Exercise Habits: The patient does not participate in regular exercise at present, Exercise limited by: None identified  Diet (meal preparation, eat out, water intake, caffeinated beverages, dairy products, fruits and vegetables): in general, a "healthy" diet  , well balanced, low fat/ cholesterol, low salt drinks 1-2 bottles of water per day, sweet tea, green tea coffee.   Discussed importance of reading labels, low salt, low cholesterol diet. Encouraged patient to increase water intake.  Goals    . Exercise 3x per week (30 min per time)          Walk or do Wii system to exercise x 3 weekly.    . Weight < 180 lb (81.647 kg)          The last  time you weight at goal; you were walking; had a buddy; walked around the parking deck or the park on weekends;  Can go to country park; 5 min from home; Motivation 5; Go for it!!/ Benefits; healthier; had more energy; just feels better;     . Weight < 200 lb (90.719 kg)      Fall Risk Fall Risk  12/09/2016 11/18/2015 09/08/2012  Falls in the past year? No No No   Depression Screen PHQ 2/9 Scores 12/09/2016 11/18/2015 09/08/2012  PHQ - 2 Score 0 0 0     Cognitive Function       Ad8 score reviewed for issues:  Issues making decisions: no  Less interest in hobbies / activities: no  Repeats questions, stories (family complaining): no  Trouble using ordinary gadgets (microwave, computer, phone): no  Forgets the month or year: no  Mismanaging finances: no  Remembering appts: no  Daily problems with thinking and/or memory: no Ad8 score is= 0     Immunization History  Administered Date(s) Administered  . Influenza,inj,Quad PF,36+ Mos 06/01/2016  . Influenza-Unspecified 08/05/2012  . Pneumococcal Polysaccharide-23 09/08/2012  . Tdap 10/06/2003   Screening Tests Health Maintenance  Topic Date Due  . PNA vac Low Risk Adult (2 of 2 - PCV13) 09/08/2013  . TETANUS/TDAP  10/05/2013  . COLONOSCOPY  10/05/2016  . MAMMOGRAM  03/12/2017  . INFLUENZA VACCINE  Completed  . DEXA SCAN  Completed  . Hepatitis C Screening  Completed      Plan:     Continue to eat heart healthy diet (full of fruits, vegetables, whole grains, lean protein, water--limit salt, fat, and sugar intake) and increase physical activity as tolerated.  Continue doing brain stimulating activities (puzzles, reading, adult coloring books, staying active) to keep memory sharp.   Placed referral for mammogram and patient received pneumonia vaccine today.  During the course of the visit the patient was educated and counseled about the following appropriate screening and preventive services:   Vaccines to  include Pneumoccal, Influenza, Hepatitis B, Td, Zostavax, HCV  Cardiovascular Disease  Colorectal cancer screening  Bone density screening  Diabetes screening  Glaucoma screening  Mammography/PAP  Nutrition counseling   Patient Instructions (the written plan) was given to the patient.   Michiel Cowboy, RN  12/09/2016    Medical screening examination/treatment/procedure(s) were performed by non-physician practitioner and as supervising provider I was immediately available for consultation/collaboration. I agree with treatment plan. Mauricio Po, FNP

## 2016-12-09 ENCOUNTER — Ambulatory Visit (INDEPENDENT_AMBULATORY_CARE_PROVIDER_SITE_OTHER): Payer: Medicare Other | Admitting: *Deleted

## 2016-12-09 VITALS — BP 152/90 | HR 68 | Resp 18 | Ht 67.0 in | Wt 216.0 lb

## 2016-12-09 DIAGNOSIS — Z23 Encounter for immunization: Secondary | ICD-10-CM

## 2016-12-09 DIAGNOSIS — Z0000 Encounter for general adult medical examination without abnormal findings: Secondary | ICD-10-CM | POA: Diagnosis not present

## 2016-12-09 DIAGNOSIS — Z1239 Encounter for other screening for malignant neoplasm of breast: Secondary | ICD-10-CM

## 2016-12-09 NOTE — Patient Instructions (Addendum)
Continue to eat heart healthy diet (full of fruits, vegetables, whole grains, lean protein, water--limit salt, fat, and sugar intake) and increase physical activity as tolerated.  Continue doing brain stimulating activities (puzzles, reading, adult coloring books, staying active) to keep memory sharp.   Patricia Murillo , Thank you for taking time to come for your Medicare Wellness Visit. I appreciate your ongoing commitment to your health goals. Please review the following plan we discussed and let me know if I can assist you in the future.   These are the goals we discussed: Goals    . Exercise 3x per week (30 min per time)          Walk or do Wii system to exercise x 3 weekly.    . Weight < 180 lb (81.647 kg)          The last time you weight at goal; you were walking; had a buddy; walked around the parking deck or the park on weekends;  Can go to country park; 5 min from home; Motivation 5; Go for it!!/ Benefits; healthier; had more energy; just feels better;     . Weight < 200 lb (90.719 kg)       This is a list of the screening recommended for you and due dates:  Health Maintenance  Topic Date Due  . Pneumonia vaccines (2 of 2 - PCV13) 09/08/2013  . Tetanus Vaccine  10/05/2013  . Colon Cancer Screening  10/05/2016  . Mammogram  03/12/2017  . Flu Shot  Completed  . DEXA scan (bone density measurement)  Completed  .  Hepatitis C: One time screening is recommended by Center for Disease Control  (CDC) for  adults born from [birth year] through 1964.   Completed

## 2016-12-10 ENCOUNTER — Other Ambulatory Visit: Payer: Self-pay | Admitting: Family

## 2016-12-10 DIAGNOSIS — K219 Gastro-esophageal reflux disease without esophagitis: Secondary | ICD-10-CM

## 2016-12-11 ENCOUNTER — Telehealth: Payer: Self-pay | Admitting: *Deleted

## 2016-12-11 DIAGNOSIS — M129 Arthropathy, unspecified: Secondary | ICD-10-CM

## 2016-12-11 MED ORDER — TRAMADOL HCL 50 MG PO TABS: 50.0000 mg | ORAL_TABLET | Freq: Three times a day (TID) | ORAL | 0 refills | Status: DC | PRN

## 2016-12-11 NOTE — Telephone Encounter (Signed)
Left msg on triage requesting refill on tramadol...Johny Chess

## 2016-12-11 NOTE — Telephone Encounter (Signed)
Notified pt rx faxed to [facility]...Patricia Murillo

## 2016-12-11 NOTE — Telephone Encounter (Signed)
Medication printed to be faxed.  

## 2017-01-27 ENCOUNTER — Ambulatory Visit: Payer: Medicare Other

## 2017-05-18 ENCOUNTER — Other Ambulatory Visit: Payer: Self-pay | Admitting: Family

## 2017-05-18 DIAGNOSIS — M129 Arthropathy, unspecified: Secondary | ICD-10-CM

## 2017-05-18 NOTE — Telephone Encounter (Signed)
Pt called for a refill of traMADol (ULTRAM) 50 MG tablet  Had AWV 12/09/2016-Last Med Fu 06/01/2016 Notified Pt she may need appt for refill, asked me to ask to reill but if she needs an appt its not a problem  Please advise

## 2017-05-18 NOTE — Telephone Encounter (Signed)
Check [place] registry last filled 12/11/2016 @ [facility]...Patricia Murillo

## 2017-05-18 NOTE — Telephone Encounter (Signed)
Rx faxed

## 2017-05-18 NOTE — Telephone Encounter (Signed)
Last refill was 12/11/16 per [facility]

## 2017-06-25 ENCOUNTER — Encounter: Payer: Self-pay | Admitting: Family

## 2017-06-25 ENCOUNTER — Ambulatory Visit (INDEPENDENT_AMBULATORY_CARE_PROVIDER_SITE_OTHER): Payer: Medicare Other | Admitting: Family

## 2017-06-25 VITALS — BP 124/78 | HR 84 | Temp 98.6°F | Resp 16 | Ht 67.0 in | Wt 214.0 lb

## 2017-06-25 DIAGNOSIS — H1012 Acute atopic conjunctivitis, left eye: Secondary | ICD-10-CM

## 2017-06-25 DIAGNOSIS — K219 Gastro-esophageal reflux disease without esophagitis: Secondary | ICD-10-CM

## 2017-06-25 DIAGNOSIS — Z23 Encounter for immunization: Secondary | ICD-10-CM | POA: Diagnosis not present

## 2017-06-25 DIAGNOSIS — M129 Arthropathy, unspecified: Secondary | ICD-10-CM | POA: Diagnosis not present

## 2017-06-25 MED ORDER — RANITIDINE HCL 150 MG PO TABS: 150.0000 mg | ORAL_TABLET | Freq: Two times a day (BID) | ORAL | 1 refills | Status: DC

## 2017-06-25 MED ORDER — AZELASTINE HCL 0.05 % OP SOLN: 1.0000 [drp] | Freq: Two times a day (BID) | OPHTHALMIC | 1 refills | Status: AC

## 2017-06-25 MED ORDER — TRAMADOL HCL 50 MG PO TABS: 50.0000 mg | ORAL_TABLET | Freq: Three times a day (TID) | ORAL | 0 refills | Status: DC | PRN

## 2017-06-25 NOTE — Assessment & Plan Note (Signed)
Stable with current naproxen and Tramadol usage with no adverse side effects or constipation. Able to complete her activities of daily living. Continue current dosage of Tramadol and naproxen.

## 2017-06-25 NOTE — Assessment & Plan Note (Signed)
New onset itching of the left eye consistent with allergic conjunctivitis. Start Napchon A. If symptoms do not improve start azelastine. Follow up if symptoms worsen or do not improve.

## 2017-06-25 NOTE — Progress Notes (Signed)
Subjective:    Patient ID: Patricia Murillo, female    DOB: 05-Jun-1946, 71 y.o.   MRN: [id number]  Chief Complaint  Patient presents with  . Follow-up    needs zantac refilled, left eye issues, redness and itching, x1 week    HPI:  Patricia Murillo is a 71 y.o. female who  has a past medical history of Arthritis; History of chicken pox; and Hyperlipidemia. and presents today for a follow up office visit.   1.) GERD - Currently maintained on Zantac.Reports taking the medication as prescribed and denies adverse side effects. Symptoms are generally well controlled with no symptoms of exacerbation.   2.) Eye redness  - Experiencing new onset redness and itching that has been going on for about 1 week. Modifying factors include Visine which helps a little. No changes of vision or injury. Course of the symptoms has stayed about the same with minimal improvement since initial onset.    No Known Allergies    Outpatient Medications Prior to Visit  Medication Sig Dispense Refill  . naproxen sodium (ANAPROX) 220 MG tablet Take 220 mg by mouth 2 (two) times daily with a meal.    . ranitidine (ZANTAC) 150 MG tablet TAKE ONE TABLET BY MOUTH TWICE DAILY 180 tablet 1  . traMADol (ULTRAM) 50 MG tablet TAKE 1 TABLET BY MOUTH EVERY 8 HOURS AS NEEDED 90 tablet 0   No facility-administered medications prior to visit.      Past Medical History:  Diagnosis Date  . Arthritis   . History of chicken pox   . Hyperlipidemia      Review of Systems  Constitutional: Negative for chills and fever.  HENT: Positive for congestion. Negative for ear discharge, ear pain, facial swelling, sinus pain, sinus pressure and sore throat.   Eyes: Positive for redness and itching. Negative for pain and discharge.  Respiratory: Negative for chest tightness, shortness of breath and wheezing.   Cardiovascular: Negative for chest pain, palpitations and leg swelling.  Musculoskeletal: Positive for arthralgias.    Neurological: Negative for weakness and numbness.      Objective:    BP 124/78 (BP Location: Left Arm, Patient Position: Sitting, Cuff Size: Large)   Pulse 84   Temp 98.6 F (37 C) (Oral)   Resp 16   Ht 5\' 7"  (1.702 m)   Wt 214 lb (97.1 kg)   SpO2 98%   BMI 33.52 kg/m  Nursing note and vital signs reviewed.  Physical Exam  Constitutional: She is oriented to person, place, and time. She appears well-developed and well-nourished. No distress.  Eyes: Pupils are equal, round, and reactive to light. EOM are normal. Lids are everted and swept, no foreign bodies found. Left eye exhibits no chemosis, no discharge, no exudate and no hordeolum. No foreign body present in the left eye. Left conjunctiva is injected. Left conjunctiva has no hemorrhage.  Cardiovascular: Normal rate, regular rhythm, normal heart sounds and intact distal pulses.   Pulmonary/Chest: Effort normal and breath sounds normal.  Neurological: She is alert and oriented to person, place, and time.  Skin: Skin is warm and dry.  Psychiatric: She has a normal mood and affect. Her behavior is normal. Judgment and thought content normal.       Assessment & Plan:   Problem List Items Addressed This Visit      Digestive   GERD (gastroesophageal reflux disease)    Stable with current dosage of Zantac and no adverse side effects. No recent  exacerbations. Continue current dosage of Zantac.       Relevant Medications   ranitidine (ZANTAC) 150 MG tablet     Musculoskeletal and Integument   Arthritis involving multiple sites    Stable with current naproxen and Tramadol usage with no adverse side effects or constipation. Able to complete her activities of daily living. Continue current dosage of Tramadol and naproxen.       Relevant Medications   traMADol (ULTRAM) 50 MG tablet     Other   Allergic conjunctivitis of left eye - Primary    New onset itching of the left eye consistent with allergic conjunctivitis. Start  Napchon A. If symptoms do not improve start azelastine. Follow up if symptoms worsen or do not improve.           I have changed Patricia Murillo's ranitidine and traMADol. I am also having her start on azelastine. Additionally, I am having her maintain her naproxen sodium.   Meds ordered this encounter  Medications  . ranitidine (ZANTAC) 150 MG tablet    Sig: Take 1 tablet (150 mg total) by mouth 2 (two) times daily.    Dispense:  180 tablet    Refill:  1  . azelastine (OPTIVAR) 0.05 % ophthalmic solution    Sig: Place 1 drop into both eyes 2 (two) times daily.    Dispense:  6 mL    Refill:  1    Order Specific Question:   Supervising Provider    Answer:   Pricilla Holm A [[id number]]  . traMADol (ULTRAM) 50 MG tablet    Sig: Take 1 tablet (50 mg total) by mouth every 8 (eight) hours as needed.    Dispense:  90 tablet    Refill:  0    Order Specific Question:   Supervising Provider    Answer:   Pricilla Holm A [[id number]]     Follow-up: Return if symptoms worsen or fail to improve.  Mauricio Po, FNP

## 2017-06-25 NOTE — Assessment & Plan Note (Signed)
Stable with current dosage of Zantac and no adverse side effects. No recent exacerbations. Continue current dosage of Zantac.

## 2017-06-25 NOTE — Patient Instructions (Signed)
Thank you for choosing [facility].  SUMMARY AND INSTRUCTIONS:  Your medications have been refilled.   Either Naphcon A or prescription for azelastine for your eye.   Follow up if your symptoms worsen or do not improve.   Medication:  Your prescription(s) have been submitted to your pharmacy or been printed and provided for you. Please take as directed and contact our office if you believe you are having problem(s) with the medication(s) or have any questions.  Follow up:  If your symptoms worsen or fail to improve, please contact our office for further instruction, or in case of emergency go directly to the emergency room at the closest medical facility.    Allergic Conjunctivitis A clear membrane (conjunctiva) covers the white part of your eye and the inner surface of your eyelid. Allergic conjunctivitis happens when this membrane has inflammation. This is caused by allergies. Common causes of allergic reactions (allergens)include:  Outdoor allergens, such as: ? Pollen. ? Grass and weeds. ? Mold spores.  Indoor allergens, such as: ? Dust. ? Smoke. ? Mold. ? Pet dander. ? Animal hair.  This condition can make your eye red or pink. It can also make your eye feel itchy. This condition cannot be spread from one person to another person (is not contagious). Follow these instructions at home:  Try not to be around things that you are allergic to.  Take or apply over-the-counter and prescription medicines only as told by your doctor. These include any eye drops.  Place a cool, clean washcloth on your eye for 10-20 minutes. Do this 3-4 times a day.  Do not touch or rub your eyes.  Do not wear contact lenses until the inflammation is gone. Wear glasses instead.  Do not wear eye makeup until the inflammation is gone.  Keep all follow-up visits as told by your doctor. This is important. Contact a doctor if:  Your symptoms get worse.  Your symptoms do not get better  with treatment.  You have mild eye pain.  You are sensitive to light,  You have spots or blisters on your eyes.  You have pus coming from your eye.  You have a fever. Get help right away if:  You have redness, swelling, or other symptoms in only one eye.  Your vision is blurry.  You have vision changes.  You have very bad eye pain. Summary  Allergic conjunctivitis is caused by allergies. It can make your eye red or pink, and it can make your eye feel itchy.  This condition cannot be spread from one person to another person (is not contagious).  Try not to be around things that you are allergic to.  Take or apply over-the-counter and prescription medicines only as told by your doctor. These include any eye drops.  Contact your doctor if your symptoms get worse or they do not get better with treatment. This information is not intended to replace advice given to you by your health care provider. Make sure you discuss any questions you have with your health care provider. Document Released: 03/11/2010 Document Revised: 05/15/2016 Document Reviewed: 05/15/2016 Elsevier Interactive Patient Education  2017 [employer].

## 2017-07-15 ENCOUNTER — Encounter (HOSPITAL_COMMUNITY): Payer: Self-pay

## 2017-07-15 ENCOUNTER — Emergency Department (HOSPITAL_COMMUNITY): Payer: Medicare Other

## 2017-07-15 ENCOUNTER — Emergency Department (HOSPITAL_COMMUNITY)
Admission: EM | Admit: 2017-07-15 | Discharge: 2017-07-15 | Disposition: A | Discharge status: Home / Self Care | Payer: Medicare Other | Attending: Emergency Medicine | Admitting: Emergency Medicine

## 2017-07-15 DIAGNOSIS — N390 Urinary tract infection, site not specified: Secondary | ICD-10-CM | POA: Insufficient documentation

## 2017-07-15 DIAGNOSIS — H02402 Unspecified ptosis of left eyelid: Secondary | ICD-10-CM | POA: Insufficient documentation

## 2017-07-15 DIAGNOSIS — H5703 Miosis: Secondary | ICD-10-CM | POA: Diagnosis not present

## 2017-07-15 DIAGNOSIS — R531 Weakness: Secondary | ICD-10-CM | POA: Insufficient documentation

## 2017-07-15 DIAGNOSIS — G4453 Primary thunderclap headache: Secondary | ICD-10-CM | POA: Diagnosis not present

## 2017-07-15 DIAGNOSIS — R42 Dizziness and giddiness: Secondary | ICD-10-CM | POA: Insufficient documentation

## 2017-07-15 DIAGNOSIS — R51 Headache: Secondary | ICD-10-CM | POA: Diagnosis not present

## 2017-07-15 DIAGNOSIS — Z79899 Other long term (current) drug therapy: Secondary | ICD-10-CM | POA: Insufficient documentation

## 2017-07-15 DIAGNOSIS — Z87891 Personal history of nicotine dependence: Secondary | ICD-10-CM | POA: Diagnosis not present

## 2017-07-15 LAB — URINALYSIS, ROUTINE W REFLEX MICROSCOPIC
BILIRUBIN URINE: NEGATIVE
GLUCOSE, UA: NEGATIVE mg/dL
Hgb urine dipstick: NEGATIVE
KETONES UR: NEGATIVE mg/dL
Nitrite: NEGATIVE
PH: 5 (ref 5.0–8.0)
Protein, ur: 100 mg/dL — AB
SPECIFIC GRAVITY, URINE: 1.023 (ref 1.005–1.030)

## 2017-07-15 LAB — BASIC METABOLIC PANEL
ANION GAP: 10 (ref 5–15)
BUN: 13 mg/dL (ref 6–20)
CALCIUM: 9 mg/dL (ref 8.9–10.3)
CO2: 23 mmol/L (ref 22–32)
Chloride: 104 mmol/L (ref 101–111)
Creatinine, Ser: 0.68 mg/dL (ref 0.44–1.00)
Glucose, Bld: 125 mg/dL — ABNORMAL HIGH (ref 65–99)
POTASSIUM: 4.1 mmol/L (ref 3.5–5.1)
SODIUM: 137 mmol/L (ref 135–145)

## 2017-07-15 LAB — I-STAT TROPONIN, ED: Troponin i, poc: 0.02 ng/mL (ref 0.00–0.08)

## 2017-07-15 LAB — CBC
HEMATOCRIT: 36.4 % (ref 36.0–46.0)
HEMOGLOBIN: 11.4 g/dL — AB (ref 12.0–15.0)
MCH: 26.6 pg (ref 26.0–34.0)
MCHC: 31.3 g/dL (ref 30.0–36.0)
MCV: 84.8 fL (ref 78.0–100.0)
Platelets: 262 10*3/uL (ref 150–400)
RBC: 4.29 MIL/uL (ref 3.87–5.11)
RDW: 14.3 % (ref 11.5–15.5)
WBC: 7.7 10*3/uL (ref 4.0–10.5)

## 2017-07-15 LAB — CBG MONITORING, ED
Glucose-Capillary: 134 mg/dL — ABNORMAL HIGH (ref 65–99)
Glucose-Capillary: 102 mg/dL — ABNORMAL HIGH (ref 65–99)

## 2017-07-15 LAB — TROPONIN I

## 2017-07-15 MED ORDER — DEXTROSE 5 % IV SOLN
1.0000 g | Freq: Once | INTRAVENOUS | Status: AC
  Administered 2017-07-15: 1 g via INTRAVENOUS
  Filled 2017-07-15: qty 10

## 2017-07-15 MED ORDER — SODIUM CHLORIDE 0.9 % IV BOLUS (SEPSIS)
1000.0000 mL | Freq: Once | INTRAVENOUS | Status: AC
  Administered 2017-07-15: 1000 mL via INTRAVENOUS

## 2017-07-15 MED ORDER — CEPHALEXIN 500 MG PO CAPS: ORAL_CAPSULE | ORAL | 0 refills | Status: DC

## 2017-07-15 MED ORDER — MECLIZINE HCL 25 MG PO TABS
12.5000 mg | ORAL_TABLET | Freq: Once | ORAL | Status: AC
  Administered 2017-07-15: 12.5 mg via ORAL
  Filled 2017-07-15: qty 1

## 2017-07-15 MED ORDER — IOPAMIDOL (ISOVUE-370) INJECTION 76%
INTRAVENOUS | Status: AC
  Administered 2017-07-15: 50 mL
  Filled 2017-07-15: qty 50

## 2017-07-15 MED ORDER — MECLIZINE HCL 12.5 MG PO TABS: 12.5000 mg | ORAL_TABLET | Freq: Three times a day (TID) | ORAL | 0 refills | Status: AC | PRN

## 2017-07-15 NOTE — ED Notes (Signed)
Pt's CBG result was 102. Informed Nikki - RN.

## 2017-07-15 NOTE — ED Triage Notes (Signed)
Pt reports dizziness that started when she woke up at 0200. She states she went to bed at 11pm and woke up at 0200 with dizziness. Pt also reports diarrhea for a few hours (3 episodes). Pt alert and oriented, skin warm and dry. Vitals stable in triage.

## 2017-07-15 NOTE — Consult Note (Signed)
NEURO HOSPITALIST CONSULT NOTE   Requestig physician: Dr. Dayna Barker  Reason for Consult: Left sided ptosis with miosis  History obtained from:  Patient     HPI:                                                                                                                                          Patricia Murillo is an 71 y.o. female who was in her USOH 4 weeks ago when she noticed onset of redness and itching of her left eye, without changes in vision. She was seen by her PCP about one week later, on 9/21. Her PCP note from that visit documents that on exam both pupils were equal, round and reactive to light, with normal EOM. There was left conjunctival injection but no other ocular findings. Pertinent negatives at that time included no chemosis, no discharge, no exudate and no hordeolum; no foreign body was present in the left eye. She was diagnosed with allergic conjunctivitis of the left eye and was prescribed with Napchon A, to be followed with azelastine if symptoms did not improve. Since then, the conjunctival injection has resolved, but she has developed left sided ptosis.   She presents to the ED today for further evaluation of her left sided ptosis, in addition to evaluation for dizziness, headache and bilateral blurred vision that started on awakening this morning at 0200. She stated that she "Felt like things in the room were moving around." The episode lasted for about 10 minutes and she has had multiple similar episodes today after the initial one. She stated that her sensation does not change with position, turning her head, or any other action. She endorsed a bilateral frontal, aching, 8-9/10, nonradiating headache on initial presentation to the ED.   Currently on bedside interview, she denies changes to visual acuity, double vision or eye pain.   Past Medical History:  Diagnosis Date  . Arthritis   . History of chicken pox   . Hyperlipidemia     Past  Surgical History:  Procedure Laterality Date  . ANKLE SURGERY      Family History  Problem Relation Age of Onset  . Hypertension Mother   . Arthritis Mother   . Arthritis Father   . Cancer Sister        Breast Cancer  . Heart disease Paternal Uncle    Social History:  reports that she quit smoking about 15 years ago. She has never used smokeless tobacco. She reports that she does not drink alcohol or use drugs.  No Known Allergies  HOME MEDICATIONS:  ROS:                                                                                                                                       As per HPI.   Blood pressure (!) 155/62, pulse 68, temperature 98 F (36.7 C), resp. rate 12, SpO2 99 %.  General Examination:                                                                                                      HEENT-  [place]/AT   Lungs- Respirations unlabored Extremities- Warm and well perfused  Neurological Examination Mental Status: Alert, oriented, thought content appropriate.  Speech fluent without evidence of aphasia.  Able to follow all commands without difficulty. Cranial Nerves: II: Visual fields intact. Right pupil 4 mm in ambient light, contracting to 2 mm with penlight. Left pupil 3 mm in ambient light, contracting to 2 mm. With lights dimmed, right pupil is 5 mm and left is 3 mm.  III,IV, VI: Left sided ptosis. Right eyelid normal. Able to fully close both eyelids with equal strength. EOMI without nystagmus. No exotropia or esotropia noted.  V,VII: No facial droop. Temp sensation equal bilaterally.  VIII: Hearing intact to conversation IX,X: Palate is symmetric XI: Symmetric XII: midline tongue extension Motor: Right : Upper extremity   5/5    Left:     Upper extremity   5/5  Lower extremity   5/5     Lower extremity   5/5 Normal tone throughout; no  atrophy noted Sensory: Temp and light touch intact x 4 Deep Tendon Reflexes: 2+ and symmetric throughout Plantars: Right: downgoing  Left: downgoing Cerebellar: No ataxia with FNF bilaterally Gait: Deferred.   Lab Results: Basic Metabolic Panel:  Recent Labs Lab 07/15/17 1211  NA 137  K 4.1  CL 104  CO2 23  GLUCOSE 125*  BUN 13  CREATININE 0.68  CALCIUM 9.0    Liver Function Tests: No results for input(s): AST, ALT, ALKPHOS, BILITOT, PROT, ALBUMIN in the last 168 hours. No results for input(s): LIPASE, AMYLASE in the last 168 hours. No results for input(s): AMMONIA in the last 168 hours.  CBC:  Recent Labs Lab 07/15/17 1211  WBC 7.7  HGB 11.4*  HCT 36.4  MCV 84.8  PLT 262    Cardiac Enzymes:  Recent Labs Lab 07/15/17 1608  TROPONINI <0.03    Lipid Panel: No results for input(s): CHOL, TRIG, HDL, CHOLHDL, VLDL, LDLCALC in the last 168  hours.  CBG:  Recent Labs Lab 07/15/17 1159 07/15/17 1523  GLUCAP 134* 102*    Microbiology: No results found for this or any previous visit.  Coagulation Studies: No results for input(s): LABPROT, INR in the last 72 hours.  Imaging: Ct Angio Head W Or Wo Contrast  Result Date: 07/15/2017 CLINICAL DATA:  Thunderclap headache. EXAM: CT HEAD WITHOUT CONTRAST CT ANGIOGRAPHY OF THE HEAD AND NECK TECHNIQUE: Contiguous axial images were obtained from the base of the skull through the vertex without intravenous contrast. Multidetector CT imaging of the head and neck was performed using the standard protocol during bolus administration of intravenous contrast. Multiplanar CT image reconstructions and MIPs were obtained to evaluate the vascular anatomy. Carotid stenosis measurements (when applicable) are obtained utilizing NASCET criteria, using the distal internal carotid diameter as the denominator. CONTRAST:  50 mL Isovue 370 injected at 4 mL/second COMPARISON:  None. FINDINGS: CT HEAD Brain: No mass lesion,  intraparenchymal hemorrhage or extra-axial collection. No evidence of acute cortical infarct. Brain parenchyma and CSF-containing spaces are normal for age. Vascular: No hyperdense vessel or unexpected calcification. Skull: Normal visualized skull base, calvarium and extracranial soft tissues. Sinuses/Orbits: No sinus fluid levels or advanced mucosal thickening. No mastoid effusion. Normal orbits. CTA NECK FINDINGS Aortic arch: There is minimal calcific atherosclerosis of the aortic arch. There is no aneurysm, dissection or hemodynamically significant stenosis of the visualized ascending aorta and aortic arch. Normal variant aortic arch branching pattern with the brachiocephalic and left common carotid arteries sharing a common origin. The visualized proximal subclavian arteries are normal. Right carotid system: The right common carotid origin is widely patent. There is no common carotid or internal carotid artery dissection or aneurysm. No hemodynamically significant stenosis. Left carotid system: The left common carotid origin is widely patent. There is no common carotid or internal carotid artery dissection or aneurysm. No hemodynamically significant stenosis. Vertebral arteries: The vertebral system is codominant. Both vertebral artery origins are normal. Both vertebral arteries are normal to their confluence with the basilar artery. Skeleton: There is no bony spinal canal stenosis. No lytic or blastic lesions. Other neck: The nasopharynx is clear. The oropharynx and hypopharynx are normal. The epiglottis is normal. The supraglottic larynx, glottis and subglottic larynx are normal. No retropharyngeal collection. The parapharyngeal spaces are preserved. The parotid and submandibular glands are normal. No sialolithiasis or salivary ductal dilatation. The thyroid gland is normal. There is no cervical lymphadenopathy. Upper chest: No pneumothorax or pleural effusion. No nodules or masses. Review of the MIP images  confirms the above findings CTA HEAD FINDINGS Anterior circulation: --Intracranial internal carotid arteries: Normal. --Anterior cerebral arteries: Normal. --Middle cerebral arteries: Normal. --Posterior communicating arteries: Absent bilaterally. Posterior circulation: --Posterior cerebral arteries: Normal. --Superior cerebellar arteries: Normal. --Basilar artery: Normal. --Anterior inferior cerebellar arteries: Normal. --Posterior inferior cerebellar arteries: Normal. Venous sinuses: As permitted by contrast timing, patent. Anatomic variants: None Delayed phase: No parenchymal contrast enhancement. Review of the MIP images confirms the above findings IMPRESSION: 1. No emergent large vessel occlusion or other acute abnormality. 2. Normal head CT for age.  Normal CTA of the head and neck. 3. Minimal calcific aortic atherosclerosis (ICD10-I70.0). Electronically Signed   By: Ulyses Jarred M.D.   On: 07/15/2017 17:45   Dg Chest 2 View  Result Date: 07/15/2017 CLINICAL DATA:  Dizziness EXAM: CHEST  2 VIEW COMPARISON:  03/14/2014 FINDINGS: The lungs are clear without focal pneumonia, edema, pneumothorax or pleural effusion. The cardiopericardial silhouette is within normal limits for size.  Large hiatal hernia again noted. The visualized bony structures of the thorax are intact. Telemetry leads overlie the chest. IMPRESSION: Stable.  Large hiatal hernia without acute cardiopulmonary findings. Electronically Signed   By: Misty Stanley M.D.   On: 07/15/2017 18:25   Ct Head Wo Contrast  Result Date: 07/15/2017 CLINICAL DATA:  Thunderclap headache. EXAM: CT HEAD WITHOUT CONTRAST CT ANGIOGRAPHY OF THE HEAD AND NECK TECHNIQUE: Contiguous axial images were obtained from the base of the skull through the vertex without intravenous contrast. Multidetector CT imaging of the head and neck was performed using the standard protocol during bolus administration of intravenous contrast. Multiplanar CT image reconstructions and  MIPs were obtained to evaluate the vascular anatomy. Carotid stenosis measurements (when applicable) are obtained utilizing NASCET criteria, using the distal internal carotid diameter as the denominator. CONTRAST:  50 mL Isovue 370 injected at 4 mL/second COMPARISON:  None. FINDINGS: CT HEAD Brain: No mass lesion, intraparenchymal hemorrhage or extra-axial collection. No evidence of acute cortical infarct. Brain parenchyma and CSF-containing spaces are normal for age. Vascular: No hyperdense vessel or unexpected calcification. Skull: Normal visualized skull base, calvarium and extracranial soft tissues. Sinuses/Orbits: No sinus fluid levels or advanced mucosal thickening. No mastoid effusion. Normal orbits. CTA NECK FINDINGS Aortic arch: There is minimal calcific atherosclerosis of the aortic arch. There is no aneurysm, dissection or hemodynamically significant stenosis of the visualized ascending aorta and aortic arch. Normal variant aortic arch branching pattern with the brachiocephalic and left common carotid arteries sharing a common origin. The visualized proximal subclavian arteries are normal. Right carotid system: The right common carotid origin is widely patent. There is no common carotid or internal carotid artery dissection or aneurysm. No hemodynamically significant stenosis. Left carotid system: The left common carotid origin is widely patent. There is no common carotid or internal carotid artery dissection or aneurysm. No hemodynamically significant stenosis. Vertebral arteries: The vertebral system is codominant. Both vertebral artery origins are normal. Both vertebral arteries are normal to their confluence with the basilar artery. Skeleton: There is no bony spinal canal stenosis. No lytic or blastic lesions. Other neck: The nasopharynx is clear. The oropharynx and hypopharynx are normal. The epiglottis is normal. The supraglottic larynx, glottis and subglottic larynx are normal. No retropharyngeal  collection. The parapharyngeal spaces are preserved. The parotid and submandibular glands are normal. No sialolithiasis or salivary ductal dilatation. The thyroid gland is normal. There is no cervical lymphadenopathy. Upper chest: No pneumothorax or pleural effusion. No nodules or masses. Review of the MIP images confirms the above findings CTA HEAD FINDINGS Anterior circulation: --Intracranial internal carotid arteries: Normal. --Anterior cerebral arteries: Normal. --Middle cerebral arteries: Normal. --Posterior communicating arteries: Absent bilaterally. Posterior circulation: --Posterior cerebral arteries: Normal. --Superior cerebellar arteries: Normal. --Basilar artery: Normal. --Anterior inferior cerebellar arteries: Normal. --Posterior inferior cerebellar arteries: Normal. Venous sinuses: As permitted by contrast timing, patent. Anatomic variants: None Delayed phase: No parenchymal contrast enhancement. Review of the MIP images confirms the above findings IMPRESSION: 1. No emergent large vessel occlusion or other acute abnormality. 2. Normal head CT for age.  Normal CTA of the head and neck. 3. Minimal calcific aortic atherosclerosis (ICD10-I70.0). Electronically Signed   By: Ulyses Jarred M.D.   On: 07/15/2017 17:45   Ct Angio Neck W And/or Wo Contrast  Result Date: 07/15/2017 CLINICAL DATA:  Thunderclap headache. EXAM: CT HEAD WITHOUT CONTRAST CT ANGIOGRAPHY OF THE HEAD AND NECK TECHNIQUE: Contiguous axial images were obtained from the base of the skull through the vertex without  intravenous contrast. Multidetector CT imaging of the head and neck was performed using the standard protocol during bolus administration of intravenous contrast. Multiplanar CT image reconstructions and MIPs were obtained to evaluate the vascular anatomy. Carotid stenosis measurements (when applicable) are obtained utilizing NASCET criteria, using the distal internal carotid diameter as the denominator. CONTRAST:  50 mL Isovue  370 injected at 4 mL/second COMPARISON:  None. FINDINGS: CT HEAD Brain: No mass lesion, intraparenchymal hemorrhage or extra-axial collection. No evidence of acute cortical infarct. Brain parenchyma and CSF-containing spaces are normal for age. Vascular: No hyperdense vessel or unexpected calcification. Skull: Normal visualized skull base, calvarium and extracranial soft tissues. Sinuses/Orbits: No sinus fluid levels or advanced mucosal thickening. No mastoid effusion. Normal orbits. CTA NECK FINDINGS Aortic arch: There is minimal calcific atherosclerosis of the aortic arch. There is no aneurysm, dissection or hemodynamically significant stenosis of the visualized ascending aorta and aortic arch. Normal variant aortic arch branching pattern with the brachiocephalic and left common carotid arteries sharing a common origin. The visualized proximal subclavian arteries are normal. Right carotid system: The right common carotid origin is widely patent. There is no common carotid or internal carotid artery dissection or aneurysm. No hemodynamically significant stenosis. Left carotid system: The left common carotid origin is widely patent. There is no common carotid or internal carotid artery dissection or aneurysm. No hemodynamically significant stenosis. Vertebral arteries: The vertebral system is codominant. Both vertebral artery origins are normal. Both vertebral arteries are normal to their confluence with the basilar artery. Skeleton: There is no bony spinal canal stenosis. No lytic or blastic lesions. Other neck: The nasopharynx is clear. The oropharynx and hypopharynx are normal. The epiglottis is normal. The supraglottic larynx, glottis and subglottic larynx are normal. No retropharyngeal collection. The parapharyngeal spaces are preserved. The parotid and submandibular glands are normal. No sialolithiasis or salivary ductal dilatation. The thyroid gland is normal. There is no cervical lymphadenopathy. Upper  chest: No pneumothorax or pleural effusion. No nodules or masses. Review of the MIP images confirms the above findings CTA HEAD FINDINGS Anterior circulation: --Intracranial internal carotid arteries: Normal. --Anterior cerebral arteries: Normal. --Middle cerebral arteries: Normal. --Posterior communicating arteries: Absent bilaterally. Posterior circulation: --Posterior cerebral arteries: Normal. --Superior cerebellar arteries: Normal. --Basilar artery: Normal. --Anterior inferior cerebellar arteries: Normal. --Posterior inferior cerebellar arteries: Normal. Venous sinuses: As permitted by contrast timing, patent. Anatomic variants: None Delayed phase: No parenchymal contrast enhancement. Review of the MIP images confirms the above findings IMPRESSION: 1. No emergent large vessel occlusion or other acute abnormality. 2. Normal head CT for age.  Normal CTA of the head and neck. 3. Minimal calcific aortic atherosclerosis (ICD10-I70.0). Electronically Signed   By: Ulyses Jarred M.D.   On: 07/15/2017 17:45   Mr Brain Wo Contrast  Result Date: 07/15/2017 CLINICAL DATA:  Dizziness and headache.  Blurred vision. EXAM: MRI HEAD WITHOUT CONTRAST TECHNIQUE: Multiplanar, multiecho pulse sequences of the brain and surrounding structures were obtained without intravenous contrast. COMPARISON:  Head CT 07/15/2017 the FINDINGS: Brain: The midline structures are normal. There is no focal diffusion restriction to indicate acute infarct. There is moderate multifocal hyperintense T2-weighted signal within the periventricular white matter, most often seen in the setting of chronic microvascular ischemia. No intraparenchymal hematoma or chronic microhemorrhage. Brain volume is normal for age without lobar predominant atrophy. The dura is normal and there is no extra-axial collection. Vascular: Major intracranial arterial and venous sinus flow voids are preserved. Skull and upper cervical spine: The visualized skull base,  calvarium, upper cervical spine and extracranial soft tissues are normal. Sinuses/Orbits: No fluid levels or advanced mucosal thickening. No mastoid or middle ear effusion. Normal orbits. 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 Electronically Signed   By: Ulyses Jarred M.D.   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  1. Left carotid dissection has been ruled out with CTA.  2. Lateral medullary infarction has been ruled out with MRI. No parasellar tumor is seen. Flow voids on MRI are consistent with a normal cavernous sinus and normal cavernous segment of the left ICA, suggesting that cavernous sinus thrombosis or cavernous carotid aneurysm are not explanations for her presentation.  3. Most likely localization for a lesion explaining her presentation is intraorbital, given initial symptoms of left eye discomfort with conjunctival injection. Also, no asymmetry of palpable moisture content of the skin of patient's face, suggesting that there is no facial anhidrosis and that the sympathetic lesion is superior to the left carotid bifurcation. However, formal starch-iodine sweat testing or friction sweat-testing as an outpatient should be considered. Also helpful would be topical cocaine/apraclonidine and hydroxyamphetamine testing to differentiate between a pre- and post-ganglioninc Horner's syndrome. 4. Other localizations to be considered would be a lesion at the thoracic outlet (cervical rib, subclavian artery aneurysm), mediastinum (mediastinal tumors), pulmonary apex (Pancoast's tumor), neck (thyroid malignancies) and superior cervical ganglion. There was limited imaging of the pulmonary apices on CTA of neck; no pulmonary apex lesion is described in the Radiology report. She may need a dedicated CT of the chest.  5. Cluster headache can also be associated  with Horner's syndrome. However, the Horner's syndrome would be expected to resolve upon resolution of the headache.   Recommendations: 1. Outpatient Neuro-ophthalmology evaluation. I have recommended Dr. Michel Bickers [facility], [place]). 2. Consider obtaining a dedicated CT of the chest with special attention to the pulmonary apices.  3. Consider obtaining an MRI of the neck to assess for possible sympathetic chain or cervical spinal cord lesion.   4. Above recommendations were discussed with Dr. Dayna Barker and the patient.   Electronically signed: Dr. Kerney Elbe 07/15/2017, 11:52 PM

## 2017-07-15 NOTE — ED Provider Notes (Signed)
MSE was initiated and I personally evaluated the patient and placed orders (if any) at  2:50 on July 15, 2017.  Patricia Murillo is a 71 y.o. female, with a history of hyperlipidemia, presenting to the ED with dizzines and a headache beginning around 2AM this morning. Woke up at that time with dizziness and blurred vision bilaterally. "Felt like things in the room were moving around." Episode lasted for about 10 minutes. Has had multiple similar episodes after this.  Sensation does not change with position, turning her head, or any other action. Headache is bilateral frontal, aching, 8-9/10, nonradiating. This is a new headache for her. Had three episodes of soft stool this morning.  Denies CP, SOB, cough, dysura/hematura, hematochezia/melena, weakness, numbness, vision loss, fever/chills, recent illness, trauma, falls, or any other complaints.   Past Medical History:  Diagnosis Date  . Arthritis   . History of chicken pox   . Hyperlipidemia     Physical Exam  Constitutional: She is oriented to person, place, and time and well-developed, well-nourished, and in no distress.  HENT:  Head: Normocephalic and atraumatic.  Mouth/Throat: Oropharynx is clear and moist.  Eyes: Conjunctivae and EOM are normal.  Neck: Normal range of motion. Neck supple.  Cardiovascular: Normal rate, regular rhythm, normal heart sounds and intact distal pulses.   Pulmonary/Chest: Effort normal and breath sounds normal. No respiratory distress.  Abdominal: Soft. She exhibits no distension. There is no tenderness.  Musculoskeletal: Normal range of motion.  Lymphadenopathy:    She has no cervical adenopathy.  Neurological: She is alert and oriented to person, place, and time. She has normal reflexes.  No sensory deficits.  No noted speech deficits. No aphasia. Patient handles oral secretions without difficulty. No noted swallowing defects.  Equal grip strength bilaterally. Strength 5/5 in the upper  extremities. Strength 5/5 with flexion and extension of the hips, knees, and ankles bilaterally.  Patellar DTRs 2+ bilaterally Positive romberg. Unsteady gait. Patient unable to ambulate on her own. Coordination intact including heel to shin and finger to nose.  Unequal pupils: Right pupil approximately 5 mm, left pupil approximately 2-3 mm, both react swiftly to light. Cranial nerves otherwise III-XII grossly intact.  No facial droop.   Skin: Skin is warm and dry.  Psychiatric: Mood and affect normal.    Results for orders placed or performed during the hospital encounter of 98/11/91  Basic metabolic panel  Result Value Ref Range   Sodium 137 135 - 145 mmol/L   Potassium 4.1 3.5 - 5.1 mmol/L   Chloride 104 101 - 111 mmol/L   CO2 23 22 - 32 mmol/L   Glucose, Bld 125 (H) 65 - 99 mg/dL   BUN 13 6 - 20 mg/dL   Creatinine, Ser 0.68 0.44 - 1.00 mg/dL   Calcium 9.0 8.9 - 10.3 mg/dL   GFR calc non Af Amer >60 >60 mL/min   GFR calc Af Amer >60 >60 mL/min   Anion gap 10 5 - 15  CBC  Result Value Ref Range   WBC 7.7 4.0 - 10.5 K/uL   RBC 4.29 3.87 - 5.11 MIL/uL   Hemoglobin 11.4 (L) 12.0 - 15.0 g/dL   HCT 36.4 36.0 - 46.0 %   MCV 84.8 78.0 - 100.0 fL   MCH 26.6 26.0 - 34.0 pg   MCHC 31.3 30.0 - 36.0 g/dL   RDW 14.3 11.5 - 15.5 %   Platelets 262 150 - 400 K/uL  CBG monitoring, ED  Result Value Ref  Range   Glucose-Capillary 134 (H) 65 - 99 mg/dL   No results found.  The patient appears stable so that the remainder of the MSE may be completed by another provider.  Information on this patient communicated directly with Dr. Dayna Barker, oncoming EDP.  Vitals:   07/15/17 1152 07/15/17 1445 07/15/17 1515  BP: (!) 159/94 (!) 141/74 (!) 145/64  Pulse: 97 62   Resp: 18    Temp: 98 F (36.7 C)    TempSrc: Oral    SpO2: 98% 97%       Lorayne Bender, PA-C 07/15/17 1519    Mesner, Corene Cornea, MD 07/16/17 [phone]

## 2017-07-16 NOTE — ED Provider Notes (Signed)
[facility] DEPT Provider Note   CSN: [id number] Arrival date & time: 07/15/17  1148     History   Chief Complaint Chief Complaint  Patient presents with  . Diarrhea  . Weakness    HPI Patricia Murillo is a 71 y.o. female.   Weakness  Primary symptoms include dizziness.  Dizziness  Quality:  Room spinning Severity:  Mild Onset quality:  Gradual Timing:  Constant Progression:  Waxing and waning Chronicity:  New Context: not when bending over, not with head movement and not with inactivity   Relieved by:  None tried Worsened by:  Nothing Ineffective treatments:  None tried Associated symptoms: diarrhea, nausea and weakness     Past Medical History:  Diagnosis Date  . Arthritis   . History of chicken pox   . Hyperlipidemia     Patient Active Problem List   Diagnosis Date Noted  . Allergic conjunctivitis of left eye 06/25/2017  . Rash of hands 10/12/2016  . Right ankle pain 08/31/2016  . Arthritis involving multiple sites 06/01/2016  . Skin abscess 02/28/2015  . 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 Hyperlipidemia 12/15/2012  . Vitamin D deficiency 12/15/2012  . Arthralgia of wrist 12/15/2012  . Arthralgia of knee 12/15/2012  . GERD (gastroesophageal reflux disease) 12/15/2012  . Hyperglycemia 09/12/2012    Past Surgical History:  Procedure Laterality Date  . ANKLE SURGERY      OB History    No data available       Home Medications    Prior to Admission medications   Medication Sig Start Date End Date Taking? Authorizing Provider  azelastine (OPTIVAR) 0.05 % ophthalmic solution Place 1 drop into both eyes 2 (two) times daily. 06/25/17  Yes Golden Circle, FNP  Cholecalciferol (VITAMIN D PO) Take 1 capsule by mouth daily.   Yes [provider]  Cyanocobalamin (VITAMIN B-12 PO) Take 1 capsule by mouth daily.   Yes [provider]  naproxen sodium (ANAPROX) 220 MG tablet Take 220 mg by mouth 2 (two) times daily  with a meal.   Yes [provider]  ranitidine (ZANTAC) 150 MG tablet Take 1 tablet (150 mg total) by mouth 2 (two) times daily. 06/25/17  Yes Golden Circle, FNP  traMADol (ULTRAM) 50 MG tablet Take 1 tablet (50 mg total) by mouth every 8 (eight) hours as needed. 06/25/17  Yes Golden Circle, FNP  VITAMIN A PO Take 1 capsule by mouth daily.   Yes [provider]  cephALEXin (KEFLEX) 500 MG capsule 2 caps po bid x 7 days 07/15/17   Jackee Glasner, Corene Cornea, MD  meclizine (ANTIVERT) 12.5 MG tablet Take 1 tablet (12.5 mg total) by mouth 3 (three) times daily as needed for dizziness. 07/15/17   Chaston Bradburn, Corene Cornea, MD    Family History Family History  Problem Relation Age of Onset  . Hypertension Mother   . Arthritis Mother   . Arthritis Father   . Cancer Sister        Breast Cancer  . Heart disease Paternal Uncle     Social History Social History  Substance Use Topics  . Smoking status: Former Smoker    Quit date: 10/05/2001  . Smokeless tobacco: Never Used  . Alcohol use No     Allergies   Patient has no known allergies.   Review of Systems Review of Systems  Gastrointestinal: Positive for diarrhea and nausea.  Neurological: Positive for dizziness and weakness.  All other systems reviewed and are  negative.    Physical Exam Updated Vital Signs BP (!) 155/62   Pulse 68   Temp 98 F (36.7 C)   Resp 12   SpO2 99%   Physical Exam  Constitutional: She is oriented to person, place, and time. She appears well-developed and well-nourished.  HENT:  Head: Normocephalic and atraumatic.  Eyes: Conjunctivae and EOM are normal.  Left eye miosis and left eye ptosis  Neck: Normal range of motion.  Cardiovascular: Normal rate and regular rhythm.   Pulmonary/Chest: Effort normal. No stridor. No respiratory distress.  Abdominal: Soft. She exhibits no distension.  Musculoskeletal: Normal range of motion. She exhibits no edema or deformity.  Neurological: She is alert and  oriented to person, place, and time. She displays normal reflexes. No cranial nerve deficit. Coordination normal.  Mild dysmetria Difficulty walking No nystagmus  Skin: Skin is warm and dry.  Nursing note and vitals reviewed.    ED Treatments / Results  Labs (all labs ordered are listed, but only abnormal results are displayed) Labs Reviewed  BASIC METABOLIC PANEL - Abnormal; Notable for the following:       Result Value   Glucose, Bld 125 (*)    All other components within normal limits  CBC - Abnormal; Notable for the following:    Hemoglobin 11.4 (*)    All other components within normal limits  URINALYSIS, ROUTINE W REFLEX MICROSCOPIC - Abnormal; Notable for the following:    APPearance CLOUDY (*)    Protein, ur 100 (*)    Leukocytes, UA MODERATE (*)    Bacteria, UA RARE (*)    Squamous Epithelial / LPF 0-5 (*)    All other components within normal limits  CBG MONITORING, ED - Abnormal; Notable for the following:    Glucose-Capillary 134 (*)    All other components within normal limits  CBG MONITORING, ED - Abnormal; Notable for the following:    Glucose-Capillary 102 (*)    All other components within normal limits  URINE CULTURE  TROPONIN I  I-STAT TROPONIN, ED    EKG  EKG Interpretation  Date/Time:  Thursday July 15 2017 11:56:01 EDT Ventricular Rate:  85 PR Interval:  132 QRS Duration: 86 QT Interval:  380 QTC Calculation: 452 R Axis:   31 Text Interpretation:  Normal sinus rhythm Normal ECG No significant change since last tracing Confirmed by Merrily Pew [phone]) on 07/15/2017 4:12:26 PM       Radiology Ct Angio Head W Or Wo Contrast  Result Date: 07/15/2017 CLINICAL DATA:  Thunderclap headache. EXAM: CT HEAD WITHOUT CONTRAST CT ANGIOGRAPHY OF THE HEAD AND NECK TECHNIQUE: Contiguous axial images were obtained from the base of the skull through the vertex without intravenous contrast. Multidetector CT imaging of the head and neck was performed  using the standard protocol during bolus administration of intravenous contrast. Multiplanar CT image reconstructions and MIPs were obtained to evaluate the vascular anatomy. Carotid stenosis measurements (when applicable) are obtained utilizing NASCET criteria, using the distal internal carotid diameter as the denominator. CONTRAST:  50 mL Isovue 370 injected at 4 mL/second COMPARISON:  None. FINDINGS: CT HEAD Brain: No mass lesion, intraparenchymal hemorrhage or extra-axial collection. No evidence of acute cortical infarct. Brain parenchyma and CSF-containing spaces are normal for age. Vascular: No hyperdense vessel or unexpected calcification. Skull: Normal visualized skull base, calvarium and extracranial soft tissues. Sinuses/Orbits: No sinus fluid levels or advanced mucosal thickening. No mastoid effusion. Normal orbits. CTA NECK FINDINGS Aortic arch: There is minimal calcific  atherosclerosis of the aortic arch. There is no aneurysm, dissection or hemodynamically significant stenosis of the visualized ascending aorta and aortic arch. Normal variant aortic arch branching pattern with the brachiocephalic and left common carotid arteries sharing a common origin. The visualized proximal subclavian arteries are normal. Right carotid system: The right common carotid origin is widely patent. There is no common carotid or internal carotid artery dissection or aneurysm. No hemodynamically significant stenosis. Left carotid system: The left common carotid origin is widely patent. There is no common carotid or internal carotid artery dissection or aneurysm. No hemodynamically significant stenosis. Vertebral arteries: The vertebral system is codominant. Both vertebral artery origins are normal. Both vertebral arteries are normal to their confluence with the basilar artery. Skeleton: There is no bony spinal canal stenosis. No lytic or blastic lesions. Other neck: The nasopharynx is clear. The oropharynx and hypopharynx are  normal. The epiglottis is normal. The supraglottic larynx, glottis and subglottic larynx are normal. No retropharyngeal collection. The parapharyngeal spaces are preserved. The parotid and submandibular glands are normal. No sialolithiasis or salivary ductal dilatation. The thyroid gland is normal. There is no cervical lymphadenopathy. Upper chest: No pneumothorax or pleural effusion. No nodules or masses. Review of the MIP images confirms the above findings CTA HEAD FINDINGS Anterior circulation: --Intracranial internal carotid arteries: Normal. --Anterior cerebral arteries: Normal. --Middle cerebral arteries: Normal. --Posterior communicating arteries: Absent bilaterally. Posterior circulation: --Posterior cerebral arteries: Normal. --Superior cerebellar arteries: Normal. --Basilar artery: Normal. --Anterior inferior cerebellar arteries: Normal. --Posterior inferior cerebellar arteries: Normal. Venous sinuses: As permitted by contrast timing, patent. Anatomic variants: None Delayed phase: No parenchymal contrast enhancement. Review of the MIP images confirms the above findings IMPRESSION: 1. No emergent large vessel occlusion or other acute abnormality. 2. Normal head CT for age.  Normal CTA of the head and neck. 3. Minimal calcific aortic atherosclerosis (ICD10-I70.0). Electronically Signed   By: Ulyses Jarred M.D.   On: 07/15/2017 17:45   Dg Chest 2 View  Result Date: 07/15/2017 CLINICAL DATA:  Dizziness EXAM: CHEST  2 VIEW COMPARISON:  03/14/2014 FINDINGS: The lungs are clear without focal pneumonia, edema, pneumothorax or pleural effusion. The cardiopericardial silhouette is within normal limits for size. Large hiatal hernia again noted. The visualized bony structures of the thorax are intact. Telemetry leads overlie the chest. IMPRESSION: Stable.  Large hiatal hernia without acute cardiopulmonary findings. Electronically Signed   By: Misty Stanley M.D.   On: 07/15/2017 18:25   Ct Head Wo  Contrast  Result Date: 07/15/2017 CLINICAL DATA:  Thunderclap headache. EXAM: CT HEAD WITHOUT CONTRAST CT ANGIOGRAPHY OF THE HEAD AND NECK TECHNIQUE: Contiguous axial images were obtained from the base of the skull through the vertex without intravenous contrast. Multidetector CT imaging of the head and neck was performed using the standard protocol during bolus administration of intravenous contrast. Multiplanar CT image reconstructions and MIPs were obtained to evaluate the vascular anatomy. Carotid stenosis measurements (when applicable) are obtained utilizing NASCET criteria, using the distal internal carotid diameter as the denominator. CONTRAST:  50 mL Isovue 370 injected at 4 mL/second COMPARISON:  None. FINDINGS: CT HEAD Brain: No mass lesion, intraparenchymal hemorrhage or extra-axial collection. No evidence of acute cortical infarct. Brain parenchyma and CSF-containing spaces are normal for age. Vascular: No hyperdense vessel or unexpected calcification. Skull: Normal visualized skull base, calvarium and extracranial soft tissues. Sinuses/Orbits: No sinus fluid levels or advanced mucosal thickening. No mastoid effusion. Normal orbits. CTA NECK FINDINGS Aortic arch: There is minimal calcific atherosclerosis of  the aortic arch. There is no aneurysm, dissection or hemodynamically significant stenosis of the visualized ascending aorta and aortic arch. Normal variant aortic arch branching pattern with the brachiocephalic and left common carotid arteries sharing a common origin. The visualized proximal subclavian arteries are normal. Right carotid system: The right common carotid origin is widely patent. There is no common carotid or internal carotid artery dissection or aneurysm. No hemodynamically significant stenosis. Left carotid system: The left common carotid origin is widely patent. There is no common carotid or internal carotid artery dissection or aneurysm. No hemodynamically significant stenosis.  Vertebral arteries: The vertebral system is codominant. Both vertebral artery origins are normal. Both vertebral arteries are normal to their confluence with the basilar artery. Skeleton: There is no bony spinal canal stenosis. No lytic or blastic lesions. Other neck: The nasopharynx is clear. The oropharynx and hypopharynx are normal. The epiglottis is normal. The supraglottic larynx, glottis and subglottic larynx are normal. No retropharyngeal collection. The parapharyngeal spaces are preserved. The parotid and submandibular glands are normal. No sialolithiasis or salivary ductal dilatation. The thyroid gland is normal. There is no cervical lymphadenopathy. Upper chest: No pneumothorax or pleural effusion. No nodules or masses. Review of the MIP images confirms the above findings CTA HEAD FINDINGS Anterior circulation: --Intracranial internal carotid arteries: Normal. --Anterior cerebral arteries: Normal. --Middle cerebral arteries: Normal. --Posterior communicating arteries: Absent bilaterally. Posterior circulation: --Posterior cerebral arteries: Normal. --Superior cerebellar arteries: Normal. --Basilar artery: Normal. --Anterior inferior cerebellar arteries: Normal. --Posterior inferior cerebellar arteries: Normal. Venous sinuses: As permitted by contrast timing, patent. Anatomic variants: None Delayed phase: No parenchymal contrast enhancement. Review of the MIP images confirms the above findings IMPRESSION: 1. No emergent large vessel occlusion or other acute abnormality. 2. Normal head CT for age.  Normal CTA of the head and neck. 3. Minimal calcific aortic atherosclerosis (ICD10-I70.0). Electronically Signed   By: Ulyses Jarred M.D.   On: 07/15/2017 17:45   Ct Angio Neck W And/or Wo Contrast  Result Date: 07/15/2017 CLINICAL DATA:  Thunderclap headache. EXAM: CT HEAD WITHOUT CONTRAST CT ANGIOGRAPHY OF THE HEAD AND NECK TECHNIQUE: Contiguous axial images were obtained from the base of the skull through  the vertex without intravenous contrast. Multidetector CT imaging of the head and neck was performed using the standard protocol during bolus administration of intravenous contrast. Multiplanar CT image reconstructions and MIPs were obtained to evaluate the vascular anatomy. Carotid stenosis measurements (when applicable) are obtained utilizing NASCET criteria, using the distal internal carotid diameter as the denominator. CONTRAST:  50 mL Isovue 370 injected at 4 mL/second COMPARISON:  None. FINDINGS: CT HEAD Brain: No mass lesion, intraparenchymal hemorrhage or extra-axial collection. No evidence of acute cortical infarct. Brain parenchyma and CSF-containing spaces are normal for age. Vascular: No hyperdense vessel or unexpected calcification. Skull: Normal visualized skull base, calvarium and extracranial soft tissues. Sinuses/Orbits: No sinus fluid levels or advanced mucosal thickening. No mastoid effusion. Normal orbits. CTA NECK FINDINGS Aortic arch: There is minimal calcific atherosclerosis of the aortic arch. There is no aneurysm, dissection or hemodynamically significant stenosis of the visualized ascending aorta and aortic arch. Normal variant aortic arch branching pattern with the brachiocephalic and left common carotid arteries sharing a common origin. The visualized proximal subclavian arteries are normal. Right carotid system: The right common carotid origin is widely patent. There is no common carotid or internal carotid artery dissection or aneurysm. No hemodynamically significant stenosis. Left carotid system: The left common carotid origin is widely patent. There is no  common carotid or internal carotid artery dissection or aneurysm. No hemodynamically significant stenosis. Vertebral arteries: The vertebral system is codominant. Both vertebral artery origins are normal. Both vertebral arteries are normal to their confluence with the basilar artery. Skeleton: There is no bony spinal canal stenosis.  No lytic or blastic lesions. Other neck: The nasopharynx is clear. The oropharynx and hypopharynx are normal. The epiglottis is normal. The supraglottic larynx, glottis and subglottic larynx are normal. No retropharyngeal collection. The parapharyngeal spaces are preserved. The parotid and submandibular glands are normal. No sialolithiasis or salivary ductal dilatation. The thyroid gland is normal. There is no cervical lymphadenopathy. Upper chest: No pneumothorax or pleural effusion. No nodules or masses. Review of the MIP images confirms the above findings CTA HEAD FINDINGS Anterior circulation: --Intracranial internal carotid arteries: Normal. --Anterior cerebral arteries: Normal. --Middle cerebral arteries: Normal. --Posterior communicating arteries: Absent bilaterally. Posterior circulation: --Posterior cerebral arteries: Normal. --Superior cerebellar arteries: Normal. --Basilar artery: Normal. --Anterior inferior cerebellar arteries: Normal. --Posterior inferior cerebellar arteries: Normal. Venous sinuses: As permitted by contrast timing, patent. Anatomic variants: None Delayed phase: No parenchymal contrast enhancement. Review of the MIP images confirms the above findings IMPRESSION: 1. No emergent large vessel occlusion or other acute abnormality. 2. Normal head CT for age.  Normal CTA of the head and neck. 3. Minimal calcific aortic atherosclerosis (ICD10-I70.0). Electronically Signed   By: Ulyses Jarred M.D.   On: 07/15/2017 17:45   Mr Brain Wo Contrast  Result Date: 07/15/2017 CLINICAL DATA:  Dizziness and headache.  Blurred vision. EXAM: MRI HEAD WITHOUT CONTRAST TECHNIQUE: Multiplanar, multiecho pulse sequences of the brain and surrounding structures were obtained without intravenous contrast. COMPARISON:  Head CT 07/15/2017 the FINDINGS: Brain: The midline structures are normal. There is no focal diffusion restriction to indicate acute infarct. There is moderate multifocal hyperintense T2-weighted  signal within the periventricular white matter, most often seen in the setting of chronic microvascular ischemia. No intraparenchymal hematoma or chronic microhemorrhage. Brain volume is normal for age without lobar predominant atrophy. The dura is normal and there is no extra-axial collection. Vascular: Major intracranial arterial and venous sinus flow voids are preserved. Skull and upper cervical spine: The visualized skull base, calvarium, upper cervical spine and extracranial soft tissues are normal. Sinuses/Orbits: No fluid levels or advanced mucosal thickening. No mastoid or middle ear effusion. Normal orbits. 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 Electronically Signed   By: Ulyses Jarred M.D.   On: 07/15/2017 22:50    Procedures Procedures (including critical care time)  Medications Ordered in ED Medications  iopamidol (ISOVUE-370) 76 % injection (50 mLs  Contrast Given 07/15/17 1711)  meclizine (ANTIVERT) tablet 12.5 mg (12.5 mg Oral Given 07/15/17 1801)  cefTRIAXone (ROCEPHIN) 1 g in dextrose 5 % 50 mL IVPB (0 g Intravenous Stopped 07/15/17 2045)  sodium chloride 0.9 % bolus 1,000 mL (1,000 mLs Intravenous New Bag/Given 07/15/17 1933)     Initial Impression / Assessment and Plan / ED Course  I have reviewed the triage vital signs and the nursing notes.  Pertinent labs & imaging results that were available during my care of the patient were reviewed by me and considered in my medical decision making (see chart for details).     Patient with prolonged workup secondary to multiple findings on exam and her history. No evidence of Pancoast tumor to cause a Horner's syndrome. No evidence of stroke. Neurology evaluated and thought this could possibly be a primary orbit or  eye problem andto refer to a neuro-ophthalmologist.also found to have a urinary tract infection which will be treated with Keflex. Symptoms improved with  the meclizine so we'll treat with that at home as well.  Final Clinical Impressions(s) / ED Diagnoses   Final diagnoses:  Urinary tract infection without hematuria, site unspecified  Vertigo  Ptosis of left eyelid  Miosis    New Prescriptions Discharge Medication List as of 07/15/2017 11:32 PM    START taking these medications   Details  cephALEXin (KEFLEX) 500 MG capsule 2 caps po bid x 7 days, Print    meclizine (ANTIVERT) 12.5 MG tablet Take 1 tablet (12.5 mg total) by mouth 3 (three) times daily as needed for dizziness., Starting Thu 07/15/2017, Print         Neira Bentsen, Corene Cornea, MD 07/16/17 [id number]

## 2017-07-17 LAB — URINE CULTURE

## 2017-08-21 ENCOUNTER — Other Ambulatory Visit: Payer: Self-pay | Admitting: Family

## 2017-08-21 DIAGNOSIS — M129 Arthropathy, unspecified: Secondary | ICD-10-CM

## 2017-08-23 ENCOUNTER — Encounter: Payer: Self-pay | Admitting: Family

## 2017-08-23 ENCOUNTER — Telehealth: Payer: Self-pay

## 2017-08-23 ENCOUNTER — Other Ambulatory Visit: Payer: Self-pay | Admitting: Nurse Practitioner

## 2017-08-23 DIAGNOSIS — M129 Arthropathy, unspecified: Secondary | ICD-10-CM

## 2017-08-23 MED ORDER — TRAMADOL HCL 50 MG PO TABS: 50.0000 mg | ORAL_TABLET | Freq: Three times a day (TID) | ORAL | 0 refills | Status: DC | PRN

## 2017-08-23 NOTE — Telephone Encounter (Signed)
Refill sent.

## 2017-08-23 NOTE — Telephone Encounter (Signed)
Pt is requesting a refill of tramadol. She has an appt to establish in January. Please advise

## 2017-08-23 NOTE — Telephone Encounter (Signed)
Please advise. Last refill was 07/08/2017 per [place] [facility]. Are you ok to refill medication? She is establishing with you in January.

## 2017-10-04 ENCOUNTER — Other Ambulatory Visit: Payer: Self-pay | Admitting: Nurse Practitioner

## 2017-10-04 DIAGNOSIS — M129 Arthropathy, unspecified: Secondary | ICD-10-CM

## 2017-10-06 ENCOUNTER — Other Ambulatory Visit: Payer: Self-pay | Admitting: Nurse Practitioner

## 2017-10-06 DIAGNOSIS — M129 Arthropathy, unspecified: Secondary | ICD-10-CM

## 2017-10-06 MED ORDER — TRAMADOL HCL 50 MG PO TABS: 50.0000 mg | ORAL_TABLET | Freq: Three times a day (TID) | ORAL | 0 refills | Status: DC | PRN

## 2017-10-06 NOTE — Telephone Encounter (Signed)
Patient is establishing with you on 10/29/17. Her last refill was 08/24/17. Please advise on refill.

## 2017-10-14 DIAGNOSIS — H5213 Myopia, bilateral: Secondary | ICD-10-CM | POA: Diagnosis not present

## 2017-10-28 ENCOUNTER — Ambulatory Visit: Payer: Medicare Other | Admitting: Nurse Practitioner

## 2017-10-29 ENCOUNTER — Ambulatory Visit: Payer: Medicare Other | Admitting: Nurse Practitioner

## 2017-10-29 DIAGNOSIS — Z0289 Encounter for other administrative examinations: Secondary | ICD-10-CM

## 2017-12-03 DIAGNOSIS — H524 Presbyopia: Secondary | ICD-10-CM | POA: Diagnosis not present

## 2017-12-03 DIAGNOSIS — H52209 Unspecified astigmatism, unspecified eye: Secondary | ICD-10-CM | POA: Diagnosis not present

## 2017-12-03 DIAGNOSIS — H5203 Hypermetropia, bilateral: Secondary | ICD-10-CM | POA: Diagnosis not present

## 2018-04-18 ENCOUNTER — Emergency Department (HOSPITAL_COMMUNITY): Payer: Medicare HMO

## 2018-04-18 ENCOUNTER — Other Ambulatory Visit: Payer: Self-pay

## 2018-04-18 ENCOUNTER — Encounter (HOSPITAL_COMMUNITY)
Admission: EM | Disposition: A | Discharge status: Home / Self Care | Payer: Self-pay | Source: Home / Self Care | Attending: Internal Medicine

## 2018-04-18 ENCOUNTER — Inpatient Hospital Stay (HOSPITAL_COMMUNITY)
Admission: EM | Admit: 2018-04-18 | Discharge: 2018-04-19 | DRG: 244 | Disposition: A | Discharge status: Home / Self Care | Payer: Medicare HMO | Attending: Internal Medicine | Admitting: Internal Medicine

## 2018-04-18 ENCOUNTER — Encounter (HOSPITAL_COMMUNITY): Payer: Self-pay | Admitting: Emergency Medicine

## 2018-04-18 DIAGNOSIS — I340 Nonrheumatic mitral (valve) insufficiency: Secondary | ICD-10-CM | POA: Diagnosis not present

## 2018-04-18 DIAGNOSIS — R079 Chest pain, unspecified: Secondary | ICD-10-CM | POA: Diagnosis not present

## 2018-04-18 DIAGNOSIS — Z6835 Body mass index (BMI) 35.0-35.9, adult: Secondary | ICD-10-CM | POA: Diagnosis not present

## 2018-04-18 DIAGNOSIS — Z8249 Family history of ischemic heart disease and other diseases of the circulatory system: Secondary | ICD-10-CM | POA: Diagnosis not present

## 2018-04-18 DIAGNOSIS — Z950 Presence of cardiac pacemaker: Secondary | ICD-10-CM

## 2018-04-18 DIAGNOSIS — R52 Pain, unspecified: Secondary | ICD-10-CM

## 2018-04-18 DIAGNOSIS — Z87891 Personal history of nicotine dependence: Secondary | ICD-10-CM

## 2018-04-18 DIAGNOSIS — Z791 Long term (current) use of non-steroidal anti-inflammatories (NSAID): Secondary | ICD-10-CM

## 2018-04-18 DIAGNOSIS — Z79899 Other long term (current) drug therapy: Secondary | ICD-10-CM

## 2018-04-18 DIAGNOSIS — E785 Hyperlipidemia, unspecified: Secondary | ICD-10-CM | POA: Diagnosis present

## 2018-04-18 DIAGNOSIS — E663 Overweight: Secondary | ICD-10-CM | POA: Diagnosis not present

## 2018-04-18 DIAGNOSIS — I442 Atrioventricular block, complete: Principal | ICD-10-CM | POA: Diagnosis present

## 2018-04-18 DIAGNOSIS — J90 Pleural effusion, not elsewhere classified: Secondary | ICD-10-CM | POA: Diagnosis not present

## 2018-04-18 DIAGNOSIS — M1990 Unspecified osteoarthritis, unspecified site: Secondary | ICD-10-CM | POA: Diagnosis present

## 2018-04-18 DIAGNOSIS — R0789 Other chest pain: Secondary | ICD-10-CM | POA: Diagnosis present

## 2018-04-18 HISTORY — PX: PACEMAKER IMPLANT: EP1218

## 2018-04-18 HISTORY — DX: Diaphragmatic hernia without obstruction or gangrene: K44.9

## 2018-04-18 LAB — COMPREHENSIVE METABOLIC PANEL
ALBUMIN: 3.3 g/dL — AB (ref 3.5–5.0)
ALK PHOS: 94 U/L (ref 38–126)
ALT: 12 U/L (ref 0–44)
AST: 18 U/L (ref 15–41)
Anion gap: 12 (ref 5–15)
BILIRUBIN TOTAL: 1.2 mg/dL (ref 0.3–1.2)
BUN: 8 mg/dL (ref 8–23)
CALCIUM: 9.5 mg/dL (ref 8.9–10.3)
CO2: 27 mmol/L (ref 22–32)
CREATININE: 0.72 mg/dL (ref 0.44–1.00)
Chloride: 102 mmol/L (ref 98–111)
GFR calc Af Amer: >60 mL/min (ref >60)
GFR calc non Af Amer: >60 mL/min (ref >60)
GLUCOSE: 105 mg/dL — AB (ref 70–99)
Potassium: 3.9 mmol/L (ref 3.5–5.1)
SODIUM: 141 mmol/L (ref 135–145)
TOTAL PROTEIN: 6.8 g/dL (ref 6.5–8.1)

## 2018-04-18 LAB — ECHOCARDIOGRAM COMPLETE
Height: 66 in
Weight: 3520 oz

## 2018-04-18 LAB — CBC WITH DIFFERENTIAL/PLATELET
Abs Immature Granulocytes: 0 10*3/uL (ref 0.0–0.1)
Basophils Absolute: 0 10*3/uL (ref 0.0–0.1)
Basophils Relative: 1 %
EOS ABS: 0.1 10*3/uL (ref 0.0–0.7)
Eosinophils Relative: 2 %
HEMATOCRIT: 36.9 % (ref 36.0–46.0)
Hemoglobin: 10.8 g/dL — ABNORMAL LOW (ref 12.0–15.0)
Immature Granulocytes: 0 %
LYMPHS ABS: 0.3 10*3/uL — AB (ref 0.7–4.0)
Lymphocytes Relative: 5 %
MCH: 24.2 pg — ABNORMAL LOW (ref 26.0–34.0)
MCHC: 29.3 g/dL — ABNORMAL LOW (ref 30.0–36.0)
MCV: 82.6 fL (ref 78.0–100.0)
Monocytes Absolute: 0.7 10*3/uL (ref 0.1–1.0)
Monocytes Relative: 11 %
NEUTROS ABS: 5.2 10*3/uL (ref 1.7–7.7)
NEUTROS PCT: 81 %
Platelets: 336 10*3/uL (ref 150–400)
RBC: 4.47 MIL/uL (ref 3.87–5.11)
RDW: 17.1 % — AB (ref 11.5–15.5)
WBC: 6.4 10*3/uL (ref 4.0–10.5)

## 2018-04-18 LAB — I-STAT CHEM 8, ED
BUN: 9 mg/dL (ref 8–23)
CALCIUM ION: 1.2 mmol/L (ref 1.15–1.40)
CREATININE: 0.7 mg/dL (ref 0.44–1.00)
Chloride: 103 mmol/L (ref 98–111)
GLUCOSE: 105 mg/dL — AB (ref 70–99)
HCT: 34 % — ABNORMAL LOW (ref 36.0–46.0)
HEMOGLOBIN: 11.6 g/dL — AB (ref 12.0–15.0)
POTASSIUM: 3.9 mmol/L (ref 3.5–5.1)
Sodium: 138 mmol/L (ref 135–145)
TCO2: 27 mmol/L (ref 22–32)

## 2018-04-18 LAB — I-STAT TROPONIN, ED: Troponin i, poc: 0 ng/mL (ref 0.00–0.08)

## 2018-04-18 SURGERY — PACEMAKER IMPLANT

## 2018-04-18 MED ORDER — IOPAMIDOL (ISOVUE-370) INJECTION 76%
INTRAVENOUS | Status: DC | PRN
  Administered 2018-04-18 (×2): 10 mL via INTRAVENOUS

## 2018-04-18 MED ORDER — MIDAZOLAM HCL 5 MG/5ML IJ SOLN
INTRAMUSCULAR | Status: DC | PRN
  Administered 2018-04-18 (×5): 1 mg via INTRAVENOUS

## 2018-04-18 MED ORDER — HEPARIN (PORCINE) IN NACL 1000-0.9 UT/500ML-% IV SOLN
INTRAVENOUS | Status: AC
  Filled 2018-04-18: qty 500

## 2018-04-18 MED ORDER — MIDAZOLAM HCL 5 MG/5ML IJ SOLN
INTRAMUSCULAR | Status: AC
  Filled 2018-04-18: qty 5

## 2018-04-18 MED ORDER — CEFAZOLIN SODIUM-DEXTROSE 2-4 GM/100ML-% IV SOLN
INTRAVENOUS | Status: AC
  Filled 2018-04-18: qty 100

## 2018-04-18 MED ORDER — CHLORHEXIDINE GLUCONATE 4 % EX LIQD
60.0000 mL | Freq: Once | CUTANEOUS | Status: DC
  Filled 2018-04-18: qty 60

## 2018-04-18 MED ORDER — NITROGLYCERIN 0.4 MG SL SUBL: 0.4000 mg | SUBLINGUAL_TABLET | SUBLINGUAL | Status: DC | PRN

## 2018-04-18 MED ORDER — ACETAMINOPHEN 325 MG PO TABS
325.0000 mg | ORAL_TABLET | ORAL | Status: DC | PRN
  Administered 2018-04-18 – 2018-04-19 (×3): 650 mg via ORAL
  Filled 2018-04-18 (×3): qty 2

## 2018-04-18 MED ORDER — SODIUM CHLORIDE 0.9 % IV SOLN
INTRAVENOUS | Status: AC | PRN
  Administered 2018-04-18: 50 mL/h via INTRAVENOUS

## 2018-04-18 MED ORDER — ASPIRIN EC 81 MG PO TBEC
81.0000 mg | DELAYED_RELEASE_TABLET | Freq: Every day | ORAL | Status: DC
  Administered 2018-04-19: 81 mg via ORAL
  Filled 2018-04-18: qty 1

## 2018-04-18 MED ORDER — ONDANSETRON HCL 4 MG/2ML IJ SOLN: 4.0000 mg | Freq: Four times a day (QID) | INTRAMUSCULAR | Status: DC | PRN

## 2018-04-18 MED ORDER — ACETAMINOPHEN 325 MG PO TABS: 650.0000 mg | ORAL_TABLET | ORAL | Status: DC | PRN

## 2018-04-18 MED ORDER — SODIUM CHLORIDE 0.9 % IV SOLN: INTRAVENOUS | Status: DC

## 2018-04-18 MED ORDER — SODIUM CHLORIDE 0.9 % IV SOLN
80.0000 mg | INTRAVENOUS | Status: AC
  Administered 2018-04-18: 80 mg
  Filled 2018-04-18: qty 2

## 2018-04-18 MED ORDER — LIDOCAINE HCL (PF) 1 % IJ SOLN
INTRAMUSCULAR | Status: DC | PRN
  Administered 2018-04-18 (×2): 60 mL

## 2018-04-18 MED ORDER — HEPARIN (PORCINE) IN NACL 1000-0.9 UT/500ML-% IV SOLN
INTRAVENOUS | Status: DC | PRN
  Administered 2018-04-18: 500 mL

## 2018-04-18 MED ORDER — TRAMADOL HCL 50 MG PO TABS
50.0000 mg | ORAL_TABLET | Freq: Four times a day (QID) | ORAL | Status: DC | PRN
  Administered 2018-04-18 – 2018-04-19 (×3): 50 mg via ORAL
  Filled 2018-04-18 (×3): qty 1

## 2018-04-18 MED ORDER — IOPAMIDOL (ISOVUE-370) INJECTION 76%
INTRAVENOUS | Status: AC
  Filled 2018-04-18: qty 50

## 2018-04-18 MED ORDER — SODIUM CHLORIDE 0.9 % IV SOLN
INTRAVENOUS | Status: AC
  Filled 2018-04-18: qty 2

## 2018-04-18 MED ORDER — FENTANYL CITRATE (PF) 100 MCG/2ML IJ SOLN
INTRAMUSCULAR | Status: AC
  Filled 2018-04-18: qty 2

## 2018-04-18 MED ORDER — LIDOCAINE HCL (PF) 1 % IJ SOLN
INTRAMUSCULAR | Status: AC
  Filled 2018-04-18: qty 60

## 2018-04-18 MED ORDER — CEFAZOLIN SODIUM-DEXTROSE 2-4 GM/100ML-% IV SOLN
2.0000 g | INTRAVENOUS | Status: AC
  Administered 2018-04-18: 2 g via INTRAVENOUS

## 2018-04-18 MED ORDER — FENTANYL CITRATE (PF) 100 MCG/2ML IJ SOLN
INTRAMUSCULAR | Status: DC | PRN
  Administered 2018-04-18: 12.5 ug via INTRAVENOUS
  Administered 2018-04-18: 25 ug via INTRAVENOUS
  Administered 2018-04-18 (×2): 12.5 ug via INTRAVENOUS

## 2018-04-18 MED ORDER — CEFAZOLIN SODIUM-DEXTROSE 1-4 GM/50ML-% IV SOLN
1.0000 g | Freq: Four times a day (QID) | INTRAVENOUS | Status: AC
  Administered 2018-04-18 – 2018-04-19 (×3): 1 g via INTRAVENOUS
  Filled 2018-04-18 (×3): qty 50

## 2018-04-18 SURGICAL SUPPLY — 12 items
CABLE SURGICAL S-101-97-12 (CABLE) ×6 IMPLANT
CATH RIGHTSITE C315HIS02 (CATHETERS) ×3 IMPLANT
IPG PACE AZUR XT DR MRI W1DR01 (Pacemaker) ×1 IMPLANT
LEAD CAPSURE NOVUS 45CM (Lead) ×3 IMPLANT
LEAD SELECT SECURE 3830 383069 (Lead) ×1 IMPLANT
PACE AZURE XT DR MRI W1DR01 (Pacemaker) ×3 IMPLANT
PAD DEFIB LIFELINK (PAD) ×3 IMPLANT
SELECT SECURE 3830 383069 (Lead) ×3 IMPLANT
SHEATH CLASSIC 7F (SHEATH) ×6 IMPLANT
SLITTER 6232ADJ (MISCELLANEOUS) ×3 IMPLANT
TRAY PACEMAKER INSERTION (PACKS) ×3 IMPLANT
WIRE HI TORQ VERSACORE-J 145CM (WIRE) ×3 IMPLANT

## 2018-04-18 NOTE — ED Notes (Signed)
ECHO being completed at this time 

## 2018-04-18 NOTE — Progress Notes (Signed)
  Echocardiogram 2D Echocardiogram has been performed.  Patricia Murillo 04/18/2018, 11:05 AM

## 2018-04-18 NOTE — H&P (Addendum)
Cardiology Admission History and Physical:   Patient ID: JOHANN GASCOIGNE; MRN: [id number]; DOB: 1946/05/26   Admission date: 04/18/2018  Primary Care Provider: Hendricks Limes, MD Primary Cardiologist: None Primary Electrophysiologist:  None, new to [facility]  Chief Complaint:  CP, weakness  Patient Profile:   Patricia Murillo is a 72 y.o. female with a history of only of HLD and arthritis comes to [facility] with 3-4 days of CP, weakness, dizziness.    History of Present Illness:   Patricia Murillo reports central CP with radiation to her L shoulder,, back, no SOB, it is not exertional or positional. She has had unusual weakness and dizzy spells, no syncope.  She denies any illness or fever of late, has been feeling well otherwise.  Keflex is on her hone list of medicines though apparently is old and not currently on antibiotics for anything.    LABS K+ 3.9 BUN/Creat 9/0.70 LFTs wnl poc Trop 0.00 WBC 6.4 H/H 11/34 Plts 336  Home meds reviewed No potential nodal blocking/rate limiting agents   Past Medical History:  Diagnosis Date  . Arthritis   . Hiatal hernia   . History of chicken pox   . Hyperlipidemia     Past Surgical History:  Procedure Laterality Date  . ANKLE SURGERY       Medications Prior to Admission: Prior to Admission medications   Medication Sig Start Date End Date Taking? Authorizing Provider  azelastine (OPTIVAR) 0.05 % ophthalmic solution Place 1 drop into both eyes 2 (two) times daily. 06/25/17   Golden Circle, FNP  cephALEXin (KEFLEX) 500 MG capsule 2 caps po bid x 7 days 07/15/17   Mesner, Corene Cornea, MD  Cholecalciferol (VITAMIN D PO) Take 1 capsule by mouth daily.    [provider]  Cyanocobalamin (VITAMIN B-12 PO) Take 1 capsule by mouth daily.    [provider]  meclizine (ANTIVERT) 12.5 MG tablet Take 1 tablet (12.5 mg total) by mouth 3 (three) times daily as needed for dizziness. 07/15/17   Mesner, Corene Cornea, MD  naproxen sodium  (ANAPROX) 220 MG tablet Take 220 mg by mouth 2 (two) times daily with a meal.    [provider]  ranitidine (ZANTAC) 150 MG tablet Take 1 tablet (150 mg total) by mouth 2 (two) times daily. 06/25/17   Golden Circle, FNP  traMADol (ULTRAM) 50 MG tablet Take 1 tablet (50 mg total) by mouth every 8 (eight) hours as needed. 10/06/17   Lance Sell, NP  VITAMIN A PO Take 1 capsule by mouth daily.    [provider]     Allergies:   No Known Allergies  Social History:   Social History   Socioeconomic History  . Marital status: Divorced    Spouse name: Not on file  . Number of children: 3  . Years of education: 66  . Highest education level: Not on file  Occupational History  . Occupation: Retired  Scientific laboratory technician  . Financial resource strain: Not on file  . Food insecurity:    Worry: Not on file    Inability: Not on file  . Transportation needs:    Medical: Not on file    Non-medical: Not on file  Tobacco Use  . Smoking status: Former Smoker    Last attempt to quit: 10/05/2001    Years since quitting: 16.5  . Smokeless tobacco: Never Used  Substance and Sexual Activity  . Alcohol use: No  . Drug use: No  .  Sexual activity: Not Currently  Lifestyle  . Physical activity:    Days per week: Not on file    Minutes per session: Not on file  . Stress: Not on file  Relationships  . Social connections:    Talks on phone: Not on file    Gets together: Not on file    Attends religious service: Not on file    Active member of club or organization: Not on file    Attends meetings of clubs or organizations: Not on file    Relationship status: Not on file  . Intimate partner violence:    Fear of current or ex partner: Not on file    Emotionally abused: Not on file    Physically abused: Not on file    Forced sexual activity: Not on file  Other Topics Concern  . Not on file  Social History Narrative   Regular exercise-no   Caffeine Use-yes    Family  History:   The patient's family history includes Arthritis in her father and mother; Cancer in her sister; Heart disease in her paternal uncle; Hypertension in her mother.    ROS:  Please see the history of present illness.  All other ROS reviewed and negative.     Physical Exam/Data:   Vitals:   04/18/18 0813 04/18/18 0830 04/18/18 0900 04/18/18 0915  BP:  (!) 148/66 (!) 147/68 (!) 162/87  Pulse:  61 (!) 57 63  Resp:  18 (!) 25 14  Temp:      SpO2:  98% 95% 97%  Weight: 220 lb (99.8 kg)     Height: 5\' 6"  (1.676 m)      No intake or output data in the 24 hours ending 04/18/18 0933 Filed Weights   04/18/18 0813  Weight: 220 lb (99.8 kg)   Body mass index is 35.51 kg/m.  General:  Well nourished, well developed, in no acute distress HEENT: normal Lymph: no adenopathy Neck: no JVD Endocrine:  No thryomegaly Vascular: No carotid bruits  Cardiac:  RRR; no murmurs, gallops or rubs Lungs:  CTA b/l, no wheezing, rhonchi or rales  Abd: soft, nontender Ext: no edema Musculoskeletal:  No deformities Skin: warm and dry  Neuro:  No gross focal abnormalities noted Psych:  Normal affect    EKG:  The ECG that was done today was personally reviewed and demonstrates  #1 CHB, V rate 60, QRS 58ms, no acte/ischemic looking  ST/T changes appreciated #2 CHB, V rate 59, QRS 55ms, no acute/ischemic looking  ST/T changes appreciated 10/11/18L SR 85bpm, PR 120ms, QRS 77ms, QTc 458ms   Relevant CV Studies:  Echo is ordered stat  Laboratory Data:  Chemistry Recent Labs  Lab 04/18/18 0838  NA 138  K 3.9  CL 103  GLUCOSE 105*  BUN 9  CREATININE 0.70    No results for input(s): PROT, ALBUMIN, AST, ALT, ALKPHOS, BILITOT in the last 168 hours. Hematology Recent Labs  Lab 04/18/18 0815 04/18/18 0838  WBC 6.4  --   RBC 4.47  --   HGB 10.8* 11.6*  HCT 36.9 34.0*  MCV 82.6  --   MCH 24.2*  --   MCHC 29.3*  --   RDW 17.1*  --   PLT 336  --    Cardiac EnzymesNo results for  input(s): TROPONINI in the last 168 hours.  Recent Labs  Lab 04/18/18 0836  TROPIPOC 0.00    BNPNo results for input(s): BNP, PROBNP in the last 168 hours.  DDimer No results for input(s): DDIMER in the last 168 hours.  Radiology/Studies:   Dg Chest Port 1 View Result Date: 04/18/2018 CLINICAL DATA:  Pain EXAM: PORTABLE CHEST 1 VIEW COMPARISON:  07/15/2017 FINDINGS: Mild cardiomegaly. Large hiatal hernia. No confluent airspace opacities or effusions. No acute bony abnormality. IMPRESSION: Cardiomegaly and large hiatal hernia.  No active disease. Electronically Signed   By: Rolm Baptise M.D.   On: 04/18/2018 08:46    Assessment and Plan:   1. CP 2. CHB  CP felt likely 2/2 CHB No ischemic EKG changes, poc Trop is 0.00 V rate is stable 50's, with narrow QRS BP is stable At rest patient feels OK, mentating well.  Dr. Lovena Le has seen and examined the patient, discussed CHB with the patient and daughters at bedside. No reversible causes are noted, recommends PPM implant Dr. Lovena Le discussed the procedure, risks and benefits with the patient she is agreeable to proceed. Echo is ordered stat  Will plan for PPM today    For questions or updates, please contact [facility] Please consult [email or web address] for contact info under Cardiology/STEMI.    Signed, Baldwin Jamaica, PA-C  04/18/2018 9:33 AM   EP attending patient seen and examined.  Agree with the findings as noted above.  The patient is a 72 year old woman  Who has had several days of increasing shortness of breath and chest pressure.  She was ultimately found to have complete heart block with a narrow ventricular escape.  Her cardiac enzymes are negative.  Her EKG does not suggest any coronary ischemia.  Her exam is notable for an overweight 72 year old woman in no distress.  Cardiovascular exam reveals a regular rate and rhythm and her lungs were clear.  She has no edema.  Neurologic exam was nonfocal.  EKG demonstrates  sinus rhythm with complete heart block.  Assessment and plan 1.  Complete heart block -I discussed the risks, goals, benefits, and expectations of permanent pacemaker insertion.  She wishes to proceed.  She is on no AV nodal blocking drugs. 2.  Chest pain -her symptoms are atypical for coronary ischemia.  Her enzymes are negative.  Her ECG demonstrates no acute findings.  She will undergo outpatient stress testing.  Cristopher Peru, MD

## 2018-04-18 NOTE — ED Notes (Signed)
Pt brought up to cath lab by this RN and EMT. Belongings with pt; daughter aware.

## 2018-04-18 NOTE — ED Triage Notes (Signed)
Pt states intermittent left sided chest pain for several days radiating into left shoulder, and across the back. Has some dizziness.

## 2018-04-18 NOTE — Discharge Instructions (Signed)
° ° °  Supplemental Discharge Instructions for  °Pacemaker/Defibrillator Patients ° °Activity °No heavy lifting or vigorous activity with your left/right arm for 6 to 8 weeks.  Do not raise your left/right arm above your head for one week.  Gradually raise your affected arm as drawn below. ° °        °   04/22/18                     04/23/18                    04/24/18                   04/25/18              °__ ° °NO DRIVING for  1 week   ; you may begin driving on 04/25/18 . ° °WOUND CARE °- Keep the wound area clean and dry.  Do not get this area wet for one week. No showers for one week; you may shower on  04/25/18 . °- The tape/steri-strips on your wound will fall off; do not pull them off.  No bandage is needed on the site.  DO  NOT apply any creams, oils, or ointments to the wound area. °- If you notice any drainage or discharge from the wound, any swelling or bruising at the site, or you develop a fever > 101? F after you are discharged home, call the office at once. ° °Special Instructions °- You are still able to use cellular telephones; use the ear opposite the side where you have your pacemaker/defibrillator.  Avoid carrying your cellular phone near your device. °- When traveling through airports, show security personnel your identification card to avoid being screened in the metal detectors.  Ask the security personnel to use the hand wand. °- Avoid arc welding equipment, MRI testing (magnetic resonance imaging), TENS units (transcutaneous nerve stimulators).  Call the office for questions about other devices. °- Avoid electrical appliances that are in poor condition or are not properly grounded. °- Microwave ovens are safe to be near or to operate. ° °Additional information for defibrillator patients should your device go off: °- If your device goes off ONCE and you feel fine afterward, notify the device clinic nurses. °- If your device goes off ONCE and you do not feel well afterward, call 911. °- If your  device goes off TWICE, call 911. °- If your device goes off THREE times in one day, call 911. ° °DO NOT DRIVE YOURSELF OR A FAMILY MEMBER °WITH A DEFIBRILLATOR TO THE HOSPITAL--CALL 911. ° °

## 2018-04-18 NOTE — ED Provider Notes (Signed)
[place] EMERGENCY DEPARTMENT Provider Note   CSN: [id number] Arrival date & time: 04/18/18  3500     History   Chief Complaint Chief Complaint  Patient presents with  . Chest Pain    HPI Patricia Murillo is a 73 y.o. female history of hyperlipidemia, here presenting with left-sided chest pain.  Patient states that she has left-sided substernal chest pain radiated to the left shoulder and the back ongoing for the last 3 days or so.  Patient states that it is not worse with exertion and she denies any shortness of breath.  States that she feels lightheaded and dizzy but denies passing out.  Patient states that she had no previous stress test and no known coronary artery disease.  Patient does not have a cardiologist.  The history is provided by the patient.    Past Medical History:  Diagnosis Date  . Arthritis   . Hiatal hernia   . History of chicken pox   . Hyperlipidemia     Patient Active Problem List   Diagnosis Date Noted  . Allergic conjunctivitis of left eye 06/25/2017  . Rash of hands 10/12/2016  . Right ankle pain 08/31/2016  . Arthritis involving multiple sites 06/01/2016  . Skin abscess 02/28/2015  . 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 Hyperlipidemia 12/15/2012  . Vitamin D deficiency 12/15/2012  . Arthralgia of wrist 12/15/2012  . Arthralgia of knee 12/15/2012  . GERD (gastroesophageal reflux disease) 12/15/2012  . Hyperglycemia 09/12/2012    Past Surgical History:  Procedure Laterality Date  . ANKLE SURGERY       OB History   None      Home Medications    Prior to Admission medications   Medication Sig Start Date End Date Taking? Authorizing Provider  azelastine (OPTIVAR) 0.05 % ophthalmic solution Place 1 drop into both eyes 2 (two) times daily. 06/25/17   Golden Circle, FNP  cephALEXin (KEFLEX) 500 MG capsule 2 caps po bid x 7 days 07/15/17   Mesner, Corene Cornea, MD  Cholecalciferol (VITAMIN D PO) Take 1  capsule by mouth daily.    [provider]  Cyanocobalamin (VITAMIN B-12 PO) Take 1 capsule by mouth daily.    [provider]  meclizine (ANTIVERT) 12.5 MG tablet Take 1 tablet (12.5 mg total) by mouth 3 (three) times daily as needed for dizziness. 07/15/17   Mesner, Corene Cornea, MD  naproxen sodium (ANAPROX) 220 MG tablet Take 220 mg by mouth 2 (two) times daily with a meal.    [provider]  ranitidine (ZANTAC) 150 MG tablet Take 1 tablet (150 mg total) by mouth 2 (two) times daily. 06/25/17   Golden Circle, FNP  traMADol (ULTRAM) 50 MG tablet Take 1 tablet (50 mg total) by mouth every 8 (eight) hours as needed. 10/06/17   Lance Sell, NP  VITAMIN A PO Take 1 capsule by mouth daily.    [provider]    Family History Family History  Problem Relation Age of Onset  . Hypertension Mother   . Arthritis Mother   . Arthritis Father   . Cancer Sister        Breast Cancer  . Heart disease Paternal Uncle     Social History Social History   Tobacco Use  . Smoking status: Former Smoker    Last attempt to quit: 10/05/2001    Years since quitting: 16.5  . Smokeless tobacco: Never Used  Substance Use Topics  . Alcohol  use: No  . Drug use: No     Allergies   Patient has no known allergies.   Review of Systems Review of Systems  Cardiovascular: Positive for chest pain.  All other systems reviewed and are negative.    Physical Exam Updated Vital Signs BP (!) 148/66   Pulse 61   Temp 98.7 F (37.1 C)   Resp 18   Ht 5\' 6"  (1.676 m)   Wt 99.8 kg (220 lb)   SpO2 98%   BMI 35.51 kg/m   Physical Exam  Constitutional: She is oriented to person, place, and time.  Uncomfortable   HENT:  Head: Normocephalic.  Eyes: Pupils are equal, round, and reactive to light. EOM are normal.  Neck: Normal range of motion.  Cardiovascular:  Bradycardic, irregular   Pulmonary/Chest: Effort normal and breath sounds normal.  Abdominal: Soft. Bowel  sounds are normal.  Musculoskeletal: Normal range of motion.       Right lower leg: Normal.       Left lower leg: Normal.  Neurological: She is alert and oriented to person, place, and time.  Skin: Skin is warm. Capillary refill takes less than 2 seconds.  Psychiatric: She has a normal mood and affect.  Nursing note and vitals reviewed.    ED Treatments / Results  Labs (all labs ordered are listed, but only abnormal results are displayed) Labs Reviewed  I-STAT CHEM 8, ED - Abnormal; Notable for the following components:      Result Value   Glucose, Bld 105 (*)    Hemoglobin 11.6 (*)    HCT 34.0 (*)    All other components within normal limits  CBC WITH DIFFERENTIAL/PLATELET  COMPREHENSIVE METABOLIC PANEL  I-STAT TROPONIN, ED    EKG EKG Interpretation  Date/Time:  Monday April 18 2018 08:22:56 EDT Ventricular Rate:  60 PR Interval:    QRS Duration: 104 QT Interval:  410 QTC Calculation: 410 R Axis:   24 Text Interpretation:  AV block, complete (third degree) third degree heart block present earlier in the day  Confirmed by Wandra Arthurs [phone]) on 04/18/2018 8:26:30 AM   EKG Interpretation  Date/Time:  Monday April 18 2018 08:26:38 EDT Ventricular Rate:  59 PR Interval:    QRS Duration: 96 QT Interval:  453 QTC Calculation: 449 R Axis:   31 Text Interpretation:  AV block, complete (third degree) Baseline wander in lead(s) V3 third degree heart block present earler in in the day  Confirmed by Wandra Arthurs [phone]) on 04/18/2018 8:29:26 AM        Radiology Dg Chest Port 1 View  Result Date: 04/18/2018 CLINICAL DATA:  Pain EXAM: PORTABLE CHEST 1 VIEW COMPARISON:  07/15/2017 FINDINGS: Mild cardiomegaly. Large hiatal hernia. No confluent airspace opacities or effusions. No acute bony abnormality. IMPRESSION: Cardiomegaly and large hiatal hernia.  No active disease. Electronically Signed   By: Rolm Baptise M.D.   On: 04/18/2018 08:46    Procedures Procedures (including  critical care time)  Medications Ordered in ED Medications - No data to display   Initial Impression / Assessment and Plan / ED Course  I have reviewed the triage vital signs and the nursing notes.  Pertinent labs & imaging results that were available during my care of the patient were reviewed by me and considered in my medical decision making (see chart for details).    Patricia Murillo is a 72 y.o. female here with chest pain. Patient appears in complete heart block  on initial EKG. Placed on pacer pads but paced currently since patient is mentating well and not hypotensive. Patient is not on BB or CCB. Will get labs, CXR, trop.   8:52 AM Chemistry and trop neg. CXR clear. BP 148/66. I consulted EP to see patient. Repeat EKGs showed complete heart block.     Final Clinical Impressions(s) / ED Diagnoses   Final diagnoses:  Pain    ED Discharge Orders    None       Drenda Freeze, MD 04/18/18 [phone]

## 2018-04-19 ENCOUNTER — Encounter (HOSPITAL_COMMUNITY): Payer: Self-pay | Admitting: Internal Medicine

## 2018-04-19 ENCOUNTER — Inpatient Hospital Stay (HOSPITAL_COMMUNITY): Payer: Medicare HMO

## 2018-04-19 MED ORDER — SODIUM CHLORIDE 0.9 % IV SOLN
INTRAVENOUS | Status: DC | PRN
  Administered 2018-04-19: 08:00:00 via INTRAVENOUS

## 2018-04-19 NOTE — Plan of Care (Signed)
Continue current care plan 

## 2018-04-19 NOTE — Plan of Care (Signed)
Pt being discharged home with family

## 2018-04-19 NOTE — Progress Notes (Signed)
Explained and discussed discharge instructions to pt and daugther. Pt going home with family via w/c with belongings. No prescriptions to continue previous medicine. Follow up appt for wound and dr appt. Given to pt.

## 2018-04-19 NOTE — Discharge Summary (Addendum)
ELECTROPHYSIOLOGY PROCEDURE DISCHARGE SUMMARY    Patient ID: Patricia Murillo,  MRN: [id number], DOB/AGE: 71-Jul-1947 71 y.o.  Admit date: 04/18/2018 Discharge date: 04/19/2018  Primary Care Physician: No primary care provider on file.  Primary Cardiologist/Electrophysiologist: new to [facility], Dr. Lovena Le  Primary Discharge Diagnosis:  1. CHB 2. Symptomatic bradycardia 3. CP  Secondary Discharge Diagnosis:  1. HLD  No Known Allergies   Procedures This Admission:  1.  Implantation of a MDT dual chamber PPM on 04/18/18 by Dr Lovena Le.  The patient received a Medtronic (serial number [id number]) pacemaker, Medtronic [id number] (serial number [id number]) right atrial lead and a Medtronic [id number] (serial number [id number]) right ventricular lead  There were no immediate post procedure complications. 2.  CXR on 04/19/18 demonstrated no pneumothorax status post device implantation.   Brief HPI: Patricia Murillo is a 72 y.o. female with a history of only of HLD and arthritis comes to [facility] with 3-4 days of CP, weakness, dizziness, found to be in [facility] [facility] Course:  The patient was admitted, her V rates were OK in 50's-60's, BP remained stable, TTE noted preserved LVEF, no significant VHD, no reversible causes for her CHF were found, PPM was recommended, patient was agreeable and she underwent implant yesterday with details as outlined above.   She had an element of CP as her presenting symptom, this felt 2/2 the heart block, with no ischemic looking EKG changes, and poc Trop of zero.  She was monitored on telemetry overnight which demonstrated SR. V paced rhythm.  b/l chest sites are without hematoma or ecchymosis.  The device was interrogated and found to be functioning normally.  CXR was obtained and demonstrated no pneumothorax status post device implantation.  Wound care, arm mobility, and restrictions were reviewed with the patient.  The patient is feeling well, no recurrent or ongoing CP, or  symptoms of any kind, minimal discomfort at her procedure sites.  She states she has been OOB, ambulated without difficulty or symptoms.  She was examined by Dr. Lovena Le and considered stable for discharge to home.    Physical Exam: Vitals:   04/18/18 1935 04/18/18 2356 04/19/18 0345 04/19/18 0740  BP: (!) 149/68 (!) 148/67 138/79 139/75  Pulse: 95 85 86 85  Resp: 17   18  Temp:  98.1 F (36.7 C) 98 F (36.7 C) 98 F (36.7 C)  TempSrc:  Oral Oral Oral  SpO2: 94% 96% 96% 95%  Weight:      Height:        GEN- The patient is well appearing, alert and oriented x 3 today.   HEENT: normocephalic, atraumatic; sclera clear, conjunctiva pink; hearing intact; oropharynx clear; neck supple, no JVP Lungs- CTA b/l, normal work of breathing.  No wheezes, rales, rhonchi Heart- RRR, no murmurs, rubs or gallops, GI- soft, non-tender, non-distended Extremities- no clubbing, cyanosis, or edema MS- no significant deformity or atrophy Skin- warm and dry, no rash or lesion, b/l chest sites without hematoma/ecchymosis Psych- euthymic mood, full affect Neuro- no gross deficits   Labs:   Lab Results  Component Value Date   WBC 6.4 04/18/2018   HGB 11.6 (L) 04/18/2018   HCT 34.0 (L) 04/18/2018   MCV 82.6 04/18/2018   PLT 336 04/18/2018    Recent Labs  Lab 04/18/18 0815 04/18/18 0838  NA 141 138  K 3.9 3.9  CL 102 103  CO2 27  --   BUN 8 9  CREATININE 0.72 0.70  CALCIUM 9.5  --  PROT 6.8  --   BILITOT 1.2  --   ALKPHOS 94  --   ALT 12  --   AST 18  --   GLUCOSE 105* 105*    Discharge Medications:  Allergies as of 04/19/2018   No Known Allergies     Medication List    TAKE these medications   azelastine 0.05 % ophthalmic solution Commonly known as:  OPTIVAR Place 1 drop into both eyes 2 (two) times daily. Notes to patient:  Patient reports no longer using   cephALEXin 500 MG capsule Commonly known as:  KEFLEX 2 caps po bid x 7 days Notes to patient:  Patient reports  this is completed   cholecalciferol 1000 units tablet Commonly known as:  VITAMIN D Take 1 capsule by mouth daily.   meclizine 12.5 MG tablet Commonly known as:  ANTIVERT Take 1 tablet (12.5 mg total) by mouth 3 (three) times daily as needed for dizziness.   multivitamin with minerals Tabs tablet Take 1 tablet by mouth daily.   naproxen sodium 220 MG tablet Commonly known as:  ALEVE Take 220 mg by mouth 2 (two) times daily with a meal.   ranitidine 150 MG tablet Commonly known as:  ZANTAC Take 1 tablet (150 mg total) by mouth 2 (two) times daily.   traMADol 50 MG tablet Commonly known as:  ULTRAM Take 1 tablet (50 mg total) by mouth every 8 (eight) hours as needed. Notes to patient:  Patient reports not actively taking       Disposition:  Home  Discharge Instructions    Diet - low sodium heart healthy   Complete by:  As directed    Increase activity slowly   Complete by:  As directed      Follow-up Information    [place] [facility] Follow up on 04/28/2018.   Specialty:  Cardiology Why:  11:00AM, wound check visit Contact information: [address] [place]       Evans Lance, MD Follow up on 07/27/2018.   Specialty:  Cardiology Why:  10:45AM Contact information: [address] [address] [phone]           Duration of Discharge Encounter: Greater than 30 minutes including physician time.  Venetia Night, PA-C 04/19/2018 11:36 AM   EP Attending  Patient seen and examined. Agree with above. The patient is s/p PPM and is doing well. She will be discharged home with the usual followup. Her device has been interogated under my direction and is working normally.  Mikle Bosworth.D.

## 2018-04-21 NOTE — Consult Note (Signed)
            [facility] Primary Care Navigator  04/21/2018  LETRICIA KRINSKY 1946/09/02 [id number]   Wenttosee patient at the bedside to identify possible discharge needs but she was alreadydischargedhome per staff.  Per chart review,patientpresented with chest pain, weakness, dizziness and was found to be in complete heart block (CHB) status post pacemaker implant.  Primary care provider's office ([facility]) is listed as providing transition of care (TOC).  Patient has discharge instruction to follow-up with cardiology on 04/28/18 primary care provider in 1 week.   For additional questions please contact:  Edwena Felty A. Dorine Duffey, BSN, RN-BC [facility] PRIMARY CARE Navigator Cell: [phone]

## 2018-04-28 ENCOUNTER — Ambulatory Visit (INDEPENDENT_AMBULATORY_CARE_PROVIDER_SITE_OTHER): Payer: Medicare HMO | Admitting: *Deleted

## 2018-04-28 DIAGNOSIS — I442 Atrioventricular block, complete: Secondary | ICD-10-CM | POA: Diagnosis not present

## 2018-04-28 DIAGNOSIS — Z950 Presence of cardiac pacemaker: Secondary | ICD-10-CM | POA: Diagnosis not present

## 2018-04-28 LAB — CUP PACEART INCLINIC DEVICE CHECK
Battery Remaining Longevity: 71 mo
Brady Statistic AP VS Percent: 0 %
Brady Statistic AS VS Percent: 0.01 %
Date Time Interrogation Session: 20190725114357
Implantable Lead Model: 5076
Lead Channel Impedance Value: 247 Ohm
Lead Channel Impedance Value: 323 Ohm
Lead Channel Impedance Value: 399 Ohm
Lead Channel Pacing Threshold Amplitude: 0.5 V
Lead Channel Pacing Threshold Pulse Width: 0.4 ms
Lead Channel Sensing Intrinsic Amplitude: 9.5 mV
Lead Channel Setting Pacing Amplitude: 3.5 V
Lead Channel Setting Pacing Pulse Width: 1 ms
Lead Channel Setting Sensing Sensitivity: 1.2 mV
MDC IDC LEAD IMPLANT DT: 20190715
MDC IDC LEAD IMPLANT DT: 20190715
MDC IDC LEAD LOCATION: 753859
MDC IDC LEAD LOCATION: 753860
MDC IDC MSMT BATTERY VOLTAGE: 3.18 V
MDC IDC MSMT LEADCHNL RA PACING THRESHOLD AMPLITUDE: 1 V
MDC IDC MSMT LEADCHNL RA SENSING INTR AMPL: 3.5 mV
MDC IDC MSMT LEADCHNL RV IMPEDANCE VALUE: 532 Ohm
MDC IDC MSMT LEADCHNL RV PACING THRESHOLD PULSEWIDTH: 1 ms
MDC IDC PG IMPLANT DT: 20190715
MDC IDC SET LEADCHNL RV PACING AMPLITUDE: 3.5 V
MDC IDC STAT BRADY AP VP PERCENT: 5.54 %
MDC IDC STAT BRADY AS VP PERCENT: 94.44 %
MDC IDC STAT BRADY RA PERCENT PACED: 5.52 %
MDC IDC STAT BRADY RV PERCENT PACED: 99.99 %

## 2018-04-28 NOTE — Progress Notes (Signed)
Wound check appointment. Steri-strips removed from R and L chest incisions. Wounds without redness or edema. Incision edges approximated, wounds well healed. Normal device function. Thresholds, sensing, and impedances consistent with implant measurements. RV (His bundle) capture appears septal until LOC per EKG. RA output programmed at 3.5V with auto capture on, RV (His) output reprogrammed to 3.5V @ 1.55ms with auto capture on monitor only for extra safety margin until 3 month visit. Histogram distribution appropriate for patient and level of activity. No mode switches or high ventricular rates noted. Patient educated about wound care, arm mobility, lifting restrictions, and Carelink app. ROV with GT on 07/27/18.

## 2018-06-20 ENCOUNTER — Telehealth: Payer: Self-pay | Admitting: Internal Medicine

## 2018-06-20 NOTE — Telephone Encounter (Signed)
Patricia Murillo noticed swelling at her left chest incision 1 week ago and noticed redness yesterday. She denies fever/chills, drainage. She cannot come to the office this afternoon or while Dr. Caryl Comes is here tomorrow. Dr. Lovena Le is in the office on Wednesday- she is agreeable to a 9am wound check while he is in the office. I advised her if she experiences worsening symptoms to call us back. She verbalizes understanding.

## 2018-06-20 NOTE — Telephone Encounter (Signed)
Pt calling c/o swelling x 2 days, redness x 1day, on left side , was cut open but had a blockage so device placed on right side--pls advise

## 2018-06-21 NOTE — Telephone Encounter (Signed)
Device clinic appointment scheduled for 06/22/18 per Debroah Loop, RN.

## 2018-06-22 ENCOUNTER — Ambulatory Visit (INDEPENDENT_AMBULATORY_CARE_PROVIDER_SITE_OTHER): Payer: Medicare HMO | Admitting: *Deleted

## 2018-06-22 DIAGNOSIS — I442 Atrioventricular block, complete: Secondary | ICD-10-CM

## 2018-06-22 MED ORDER — CEPHALEXIN 500 MG PO CAPS: 500.0000 mg | ORAL_CAPSULE | Freq: Two times a day (BID) | ORAL | 0 refills | Status: AC

## 2018-06-22 NOTE — Progress Notes (Signed)
Pt seen d/t c/o of redness of left chest incision site. (Pacemaker is on pts right side). A light red spot noted about 1/8 on inch below left incision site, pt denied falling or trauma to site. Dr. Lovena Le assessed and recommended pt to start Keflex 500mg  BID x 7 days.

## 2018-06-22 NOTE — Patient Instructions (Signed)
Take Keflex 500mg  BID x 7 days. If you start having diarrhea stop medication.

## 2018-07-12 ENCOUNTER — Encounter: Payer: Self-pay | Admitting: Internal Medicine

## 2018-07-27 ENCOUNTER — Encounter: Payer: Medicare Other | Admitting: Internal Medicine

## 2018-08-04 ENCOUNTER — Ambulatory Visit: Payer: Medicare HMO | Admitting: Internal Medicine

## 2018-08-04 ENCOUNTER — Encounter: Payer: Self-pay | Admitting: Internal Medicine

## 2018-08-04 VITALS — BP 130/88 | HR 68 | Ht 66.0 in | Wt 184.0 lb

## 2018-08-04 DIAGNOSIS — Z950 Presence of cardiac pacemaker: Secondary | ICD-10-CM

## 2018-08-04 DIAGNOSIS — I442 Atrioventricular block, complete: Secondary | ICD-10-CM

## 2018-08-04 NOTE — Progress Notes (Signed)
HPI Patricia Murillo returns today for followup of CHB, s/p PPM insertion. The patient is a pleasant 71 yo woman who underwent PPM insertion about 3 months ago. In the interim, she has done well. She remains active caring for her small grandchildren and she is walking. No syncope. No edema. No chest pain or sob. No Known Allergies   Current Outpatient Medications  Medication Sig Dispense Refill  . azelastine (OPTIVAR) 0.05 % ophthalmic solution Place 1 drop into both eyes 2 (two) times daily. 6 mL 1  . meclizine (ANTIVERT) 12.5 MG tablet Take 1 tablet (12.5 mg total) by mouth 3 (three) times daily as needed for dizziness. 30 tablet 0  . Multiple Vitamin (MULTIVITAMIN WITH MINERALS) TABS tablet Take 1 tablet by mouth daily.    . naproxen sodium (ANAPROX) 220 MG tablet Take 220 mg by mouth 2 (two) times daily with a meal.    . ranitidine (ZANTAC) 150 MG tablet Take 1 tablet (150 mg total) by mouth 2 (two) times daily. 180 tablet 1   No current facility-administered medications for this visit.      Past Medical History:  Diagnosis Date  . Arthritis   . Hiatal hernia   . History of chicken pox   . Hyperlipidemia     ROS:   All systems reviewed and negative except as noted in the HPI.   Past Surgical History:  Procedure Laterality Date  . ANKLE SURGERY    . PACEMAKER IMPLANT N/A 04/18/2018   Procedure: PACEMAKER IMPLANT;  Surgeon: Evans Lance, MD;  Location: [place] [facility];  Service: Cardiovascular;  Laterality: N/A;     Family History  Problem Relation Age of Onset  . Hypertension Mother   . Arthritis Mother   . Arthritis Father   . Cancer Sister        Breast Cancer  . Heart disease Paternal Uncle      Social History   Socioeconomic History  . Marital status: Divorced    Spouse name: Not on file  . Number of children: 3  . Years of education: 46  . Highest education level: Not on file  Occupational History  . Occupation: Retired  Scientific laboratory technician  .  Financial resource strain: Not on file  . Food insecurity:    Worry: Not on file    Inability: Not on file  . Transportation needs:    Medical: Not on file    Non-medical: Not on file  Tobacco Use  . Smoking status: Former Smoker    Last attempt to quit: 10/05/2001    Years since quitting: 16.8  . Smokeless tobacco: Never Used  Substance and Sexual Activity  . Alcohol use: No  . Drug use: No  . Sexual activity: Not Currently  Lifestyle  . Physical activity:    Days per week: Not on file    Minutes per session: Not on file  . Stress: Not on file  Relationships  . Social connections:    Talks on phone: Not on file    Gets together: Not on file    Attends religious service: Not on file    Active member of club or organization: Not on file    Attends meetings of clubs or organizations: Not on file    Relationship status: Not on file  . Intimate partner violence:    Fear of current or ex partner: Not on file    Emotionally abused: Not on file  Physically abused: Not on file    Forced sexual activity: Not on file  Other Topics Concern  . Not on file  Social History Narrative   Regular exercise-no   Caffeine Use-yes     BP 130/88   Pulse 68   Ht 5\' 6"  (1.676 m)   Wt 184 lb (83.5 kg)   SpO2 96%   BMI 29.70 kg/m   Physical Exam:  Well appearing NAD HEENT: Unremarkable Neck:  6 cm JVD, no thyromegally Lymphatics:  No adenopathy Back:  No CVA tenderness Lungs:  Clear with no wheezes HEART:  Regular rate rhythm, no murmurs, no rubs, no clicks Abd:  soft, positive bowel sounds, no organomegally, no rebound, no guarding Ext:  2 plus pulses, no edema, no cyanosis, no clubbing Skin:  No rashes no nodules Neuro:  CN II through XII intact, motor grossly intact  EKG - NSR   DEVICE  Normal device function.  See PaceArt for details.   Assess/Plan: 1. CHB - her conduction is variable today. I initially tried to reduce her pacing burden by increasing her AV delay but  even at 300 ms she was at times pacing so we reduced the AV delay back to 160/180. 2. PPM - her medtronic DDD PM is working normally. She is septal pacing on the his bundle lead. 3. PAF - on her PM interogation she is having seconds to a couple of minutes of atrial fib. I warned her that if this progresses she will need to be started on systemic anti-coagulation.  Mikle Bosworth.D.

## 2018-08-04 NOTE — Patient Instructions (Signed)
Medication Instructions:  Your physician recommends that you continue on your current medications as directed. Please refer to the Current Medication list given to you today.  If you need a refill on your cardiac medications before your next appointment, please call your pharmacy.   Lab work: None ordered.  If you have labs (blood work) drawn today and your tests are completely normal, you will receive your results only by: Marland Kitchen MyChart Message (if you have MyChart) OR . A paper copy in the mail If you have any lab test that is abnormal or we need to change your treatment, we will call you to review the results.  Testing/Procedures: None ordered.  Follow-Up:  Your physician wants you to follow-up in: 9 months with Dr. Lovena Le.   You will receive a reminder letter in the mail two months in advance. If you don't receive a letter, please call our office to schedule the follow-up appointment.  Remote monitoring is used to monitor your Pacemaker from home. This monitoring reduces the number of office visits required to check your device to one time per year. It allows [place] to keep an eye on the functioning of your device to ensure it is working properly. You are scheduled for a device check from home on 11/03/2018. You may send your transmission at any time that day. If you have a wireless device, the transmission will be sent automatically. After your physician reviews your transmission, you will receive a postcard with your next transmission date.  At [facility], you and your health needs are our priority.  As part of our continuing mission to provide you with exceptional heart care, we have created designated Provider Care Teams.  These Care Teams include your primary Cardiologist (physician) and Advanced Practice Providers (APPs -  Physician Assistants and Nurse Practitioners) who all work together to provide you with the care you need, when you need it. Dennis Bast may see Cristopher Peru, MD or one of  the following Advanced Practice Providers on your designated Care Team:   . Chanetta Marshall, NP . Tommye Standard, PA-C   Any Other Special Instructions Will Be Listed Below (If Applicable).

## 2018-08-05 LAB — CUP PACEART INCLINIC DEVICE CHECK
Battery Remaining Longevity: 133 mo
Battery Voltage: 3.04 V
Brady Statistic AP VP Percent: 24.23 %
Brady Statistic AS VS Percent: 1.24 %
Brady Statistic RA Percent Paced: 24.99 %
Implantable Lead Implant Date: 20190715
Implantable Lead Implant Date: 20190715
Implantable Lead Model: 3830
Implantable Lead Model: 5076
Implantable Pulse Generator Implant Date: 20190715
Lead Channel Impedance Value: 247 Ohm
Lead Channel Impedance Value: 304 Ohm
Lead Channel Impedance Value: 342 Ohm
Lead Channel Impedance Value: 513 Ohm
Lead Channel Pacing Threshold Amplitude: 0.625 V
Lead Channel Pacing Threshold Amplitude: 1.125 V
Lead Channel Pacing Threshold Pulse Width: 0.4 ms
Lead Channel Sensing Intrinsic Amplitude: 1.25 mV
Lead Channel Sensing Intrinsic Amplitude: 2.5 mV
Lead Channel Setting Sensing Sensitivity: 1.2 mV
MDC IDC LEAD LOCATION: 753859
MDC IDC LEAD LOCATION: 753860
MDC IDC MSMT LEADCHNL RA PACING THRESHOLD PULSEWIDTH: 0.4 ms
MDC IDC MSMT LEADCHNL RV SENSING INTR AMPL: 10 mV
MDC IDC MSMT LEADCHNL RV SENSING INTR AMPL: 8.75 mV
MDC IDC SESS DTM: 20191031152320
MDC IDC SET LEADCHNL RA PACING AMPLITUDE: 1.5 V
MDC IDC SET LEADCHNL RV PACING AMPLITUDE: 2.5 V
MDC IDC SET LEADCHNL RV PACING PULSEWIDTH: 0.4 ms
MDC IDC STAT BRADY AP VS PERCENT: 0.26 %
MDC IDC STAT BRADY AS VP PERCENT: 74.27 %
MDC IDC STAT BRADY RV PERCENT PACED: 98.5 %

## 2018-08-23 ENCOUNTER — Telehealth: Payer: Self-pay | Admitting: Internal Medicine

## 2018-08-23 NOTE — Telephone Encounter (Signed)
LVM on patient's number. Direct number given.  LVM on Patricia Murillo' number. Direct number given.

## 2018-08-23 NOTE — Telephone Encounter (Signed)
Patricia Murillo called back about patient's swelling.  Patricia Murillo states that patient has had swelling around her L breast and side x 19month. Patricia Murillo states that patient has not had any fever/chills. No redness or pain. Patricia Murillo states that patient prefers not to come in for an appt. 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 PPM implanted on R side. I told Patricia Murillo that I could make her mother an appt with the device clinic for Friday when Dr.Taylor is in the office. Patricia Murillo verbalized understanding and plans to discuss this with patient and call back.

## 2018-08-23 NOTE — Telephone Encounter (Signed)
  [name] had a pacemaker placed in July by Dr Lovena Le. Patient's daughter is calling because Patricia Murillo is having swelling around her breast area and her side. She states that it doesn't hurt or anything but she has concerns as to why she is swelling. Daughter is concerned because the swelling is near the incision area.

## 2018-08-25 NOTE — Telephone Encounter (Signed)
Apt scheduled for 11/22 at 9:00am with device clinic

## 2018-08-26 ENCOUNTER — Ambulatory Visit (INDEPENDENT_AMBULATORY_CARE_PROVIDER_SITE_OTHER): Payer: Medicare HMO | Admitting: *Deleted

## 2018-08-26 DIAGNOSIS — R0602 Shortness of breath: Secondary | ICD-10-CM

## 2018-08-26 DIAGNOSIS — I442 Atrioventricular block, complete: Secondary | ICD-10-CM

## 2018-08-26 MED ORDER — APIXABAN 5 MG PO TABS: 5.0000 mg | ORAL_TABLET | Freq: Two times a day (BID) | ORAL | 3 refills | Status: DC

## 2018-08-26 NOTE — Patient Instructions (Signed)
Your physician has requested that you have an echocardiogram. Echocardiography is a painless test that uses sound waves to create images of your heart. It provides your doctor with information about the size and shape of your heart and how well your heart's chambers and valves are working. This procedure takes approximately one hour. There are no restrictions for this procedure.  Your physician has recommended you make the following change in your medication: Eliquis 5mg  twice daily.  Your physician recommends that you schedule a follow-up appointment in: 3 months with Dr.Taylor  Please remember to elevate the affected areas to improve your symptoms.

## 2018-08-27 NOTE — Progress Notes (Signed)
Patient presents to the office d/t swelling of her L arm and breast. Patient states that her L breast has been swollen for >1 month. Patient states that her L arm began to swell on Monday (11/18). Patient states that she's also had some pain in her L shoulder, which she believes may be d/t arthritis. Patient states that the pain is limiting her ROM. Patient denies any fever/chills. No bruising or redness noted in the area. Dr.Taylor assessed site and believed that patient may have developed a DVT after the attempted implant on the L side. Dr.Taylor recommended elevation of the affected areas as often as possible. Patient will also take Eliquis 5mg  BID and f/u with GT in 3 months. Dr.Taylor also advised patient to get a mammogram if the swelling of her L breast has not resolved by 10/05/2018. Dr. Lovena Le aslo ordered an echo d/t patient's DOE.  Arthritic pain management deferred to PCP.

## 2018-08-29 ENCOUNTER — Emergency Department (HOSPITAL_COMMUNITY): Payer: Medicare HMO

## 2018-08-29 ENCOUNTER — Inpatient Hospital Stay (HOSPITAL_COMMUNITY)
Admission: EM | Admit: 2018-08-29 | Discharge: 2018-09-01 | DRG: 988 | Disposition: A | Discharge status: Home Health | Payer: Medicare HMO | Attending: Internal Medicine | Admitting: Internal Medicine

## 2018-08-29 ENCOUNTER — Inpatient Hospital Stay (HOSPITAL_COMMUNITY): Payer: Medicare HMO

## 2018-08-29 ENCOUNTER — Other Ambulatory Visit: Payer: Self-pay

## 2018-08-29 ENCOUNTER — Encounter (HOSPITAL_COMMUNITY): Payer: Self-pay | Admitting: Internal Medicine

## 2018-08-29 DIAGNOSIS — Z87891 Personal history of nicotine dependence: Secondary | ICD-10-CM | POA: Diagnosis not present

## 2018-08-29 DIAGNOSIS — Z23 Encounter for immunization: Secondary | ICD-10-CM | POA: Diagnosis not present

## 2018-08-29 DIAGNOSIS — K921 Melena: Principal | ICD-10-CM | POA: Diagnosis present

## 2018-08-29 DIAGNOSIS — K449 Diaphragmatic hernia without obstruction or gangrene: Secondary | ICD-10-CM | POA: Diagnosis not present

## 2018-08-29 DIAGNOSIS — R918 Other nonspecific abnormal finding of lung field: Secondary | ICD-10-CM | POA: Diagnosis not present

## 2018-08-29 DIAGNOSIS — I442 Atrioventricular block, complete: Secondary | ICD-10-CM | POA: Diagnosis not present

## 2018-08-29 DIAGNOSIS — R1319 Other dysphagia: Secondary | ICD-10-CM | POA: Diagnosis present

## 2018-08-29 DIAGNOSIS — R590 Localized enlarged lymph nodes: Secondary | ICD-10-CM | POA: Diagnosis present

## 2018-08-29 DIAGNOSIS — D649 Anemia, unspecified: Secondary | ICD-10-CM

## 2018-08-29 DIAGNOSIS — Z66 Do not resuscitate: Secondary | ICD-10-CM | POA: Diagnosis present

## 2018-08-29 DIAGNOSIS — E785 Hyperlipidemia, unspecified: Secondary | ICD-10-CM | POA: Diagnosis present

## 2018-08-29 DIAGNOSIS — C8514 Unspecified B-cell lymphoma, lymph nodes of axilla and upper limb: Secondary | ICD-10-CM | POA: Diagnosis not present

## 2018-08-29 DIAGNOSIS — R52 Pain, unspecified: Secondary | ICD-10-CM | POA: Diagnosis not present

## 2018-08-29 DIAGNOSIS — I10 Essential (primary) hypertension: Secondary | ICD-10-CM | POA: Diagnosis present

## 2018-08-29 DIAGNOSIS — Z8249 Family history of ischemic heart disease and other diseases of the circulatory system: Secondary | ICD-10-CM

## 2018-08-29 DIAGNOSIS — K219 Gastro-esophageal reflux disease without esophagitis: Secondary | ICD-10-CM | POA: Diagnosis present

## 2018-08-29 DIAGNOSIS — D500 Iron deficiency anemia secondary to blood loss (chronic): Secondary | ICD-10-CM | POA: Diagnosis present

## 2018-08-29 DIAGNOSIS — M79605 Pain in left leg: Secondary | ICD-10-CM

## 2018-08-29 DIAGNOSIS — C801 Malignant (primary) neoplasm, unspecified: Secondary | ICD-10-CM | POA: Diagnosis present

## 2018-08-29 DIAGNOSIS — M79609 Pain in unspecified limb: Secondary | ICD-10-CM | POA: Diagnosis not present

## 2018-08-29 DIAGNOSIS — R0902 Hypoxemia: Secondary | ICD-10-CM | POA: Diagnosis not present

## 2018-08-29 DIAGNOSIS — C799 Secondary malignant neoplasm of unspecified site: Secondary | ICD-10-CM

## 2018-08-29 DIAGNOSIS — R160 Hepatomegaly, not elsewhere classified: Secondary | ICD-10-CM | POA: Diagnosis not present

## 2018-08-29 DIAGNOSIS — M7989 Other specified soft tissue disorders: Secondary | ICD-10-CM | POA: Diagnosis not present

## 2018-08-29 DIAGNOSIS — E559 Vitamin D deficiency, unspecified: Secondary | ICD-10-CM | POA: Diagnosis present

## 2018-08-29 DIAGNOSIS — I499 Cardiac arrhythmia, unspecified: Secondary | ICD-10-CM | POA: Diagnosis not present

## 2018-08-29 DIAGNOSIS — R0602 Shortness of breath: Secondary | ICD-10-CM | POA: Diagnosis not present

## 2018-08-29 DIAGNOSIS — Z79899 Other long term (current) drug therapy: Secondary | ICD-10-CM | POA: Diagnosis not present

## 2018-08-29 DIAGNOSIS — D6832 Hemorrhagic disorder due to extrinsic circulating anticoagulants: Secondary | ICD-10-CM | POA: Diagnosis not present

## 2018-08-29 DIAGNOSIS — R609 Edema, unspecified: Secondary | ICD-10-CM

## 2018-08-29 DIAGNOSIS — K922 Gastrointestinal hemorrhage, unspecified: Secondary | ICD-10-CM | POA: Diagnosis present

## 2018-08-29 DIAGNOSIS — I890 Lymphedema, not elsewhere classified: Secondary | ICD-10-CM

## 2018-08-29 DIAGNOSIS — C7800 Secondary malignant neoplasm of unspecified lung: Secondary | ICD-10-CM | POA: Diagnosis present

## 2018-08-29 DIAGNOSIS — I361 Nonrheumatic tricuspid (valve) insufficiency: Secondary | ICD-10-CM | POA: Diagnosis not present

## 2018-08-29 DIAGNOSIS — I4891 Unspecified atrial fibrillation: Secondary | ICD-10-CM | POA: Diagnosis not present

## 2018-08-29 DIAGNOSIS — Z950 Presence of cardiac pacemaker: Secondary | ICD-10-CM | POA: Diagnosis not present

## 2018-08-29 DIAGNOSIS — Z7901 Long term (current) use of anticoagulants: Secondary | ICD-10-CM

## 2018-08-29 DIAGNOSIS — D62 Acute posthemorrhagic anemia: Secondary | ICD-10-CM | POA: Diagnosis not present

## 2018-08-29 DIAGNOSIS — I82B12 Acute embolism and thrombosis of left subclavian vein: Secondary | ICD-10-CM | POA: Diagnosis present

## 2018-08-29 DIAGNOSIS — R2232 Localized swelling, mass and lump, left upper limb: Secondary | ICD-10-CM | POA: Diagnosis not present

## 2018-08-29 DIAGNOSIS — I447 Left bundle-branch block, unspecified: Secondary | ICD-10-CM | POA: Diagnosis not present

## 2018-08-29 HISTORY — DX: Presence of cardiac pacemaker: Z95.0

## 2018-08-29 LAB — COMPREHENSIVE METABOLIC PANEL
ALT: 10 U/L (ref 0–44)
ANION GAP: 10 (ref 5–15)
AST: 15 U/L (ref 15–41)
Albumin: 2.5 g/dL — ABNORMAL LOW (ref 3.5–5.0)
Alkaline Phosphatase: 77 U/L (ref 38–126)
BUN: 23 mg/dL (ref 8–23)
CHLORIDE: 100 mmol/L (ref 98–111)
CO2: 26 mmol/L (ref 22–32)
Calcium: 10.6 mg/dL — ABNORMAL HIGH (ref 8.9–10.3)
Creatinine, Ser: 1.04 mg/dL — ABNORMAL HIGH (ref 0.44–1.00)
GFR calc Af Amer: >60 mL/min (ref >60)
GFR calc non Af Amer: 53 mL/min — ABNORMAL LOW (ref >60)
Glucose, Bld: 135 mg/dL — ABNORMAL HIGH (ref 70–99)
POTASSIUM: 4.3 mmol/L (ref 3.5–5.1)
SODIUM: 136 mmol/L (ref 135–145)
Total Bilirubin: 1.3 mg/dL — ABNORMAL HIGH (ref 0.3–1.2)
Total Protein: 5.2 g/dL — ABNORMAL LOW (ref 6.5–8.1)

## 2018-08-29 LAB — PREPARE RBC (CROSSMATCH)

## 2018-08-29 LAB — CBC
HCT: 18.2 % — ABNORMAL LOW (ref 36.0–46.0)
Hemoglobin: 5 g/dL — CL (ref 12.0–15.0)
MCH: 23.9 pg — ABNORMAL LOW (ref 26.0–34.0)
MCHC: 27.5 g/dL — AB (ref 30.0–36.0)
MCV: 87.1 fL (ref 80.0–100.0)
NRBC: 0 % (ref 0.0–0.2)
Platelets: 313 10*3/uL (ref 150–400)
RBC: 2.09 MIL/uL — ABNORMAL LOW (ref 3.87–5.11)
RDW: 22.5 % — AB (ref 11.5–15.5)
WBC: 14.4 10*3/uL — AB (ref 4.0–10.5)

## 2018-08-29 LAB — ABO/RH: ABO/RH(D): O POS

## 2018-08-29 LAB — POC OCCULT BLOOD, ED: Fecal Occult Bld: POSITIVE — AB

## 2018-08-29 LAB — TROPONIN I: TROPONIN I: 0.04 ng/mL — AB (ref <0.03)

## 2018-08-29 MED ORDER — ACETAMINOPHEN 650 MG RE SUPP: 650.0000 mg | Freq: Four times a day (QID) | RECTAL | Status: DC | PRN

## 2018-08-29 MED ORDER — TRAMADOL HCL 50 MG PO TABS
50.0000 mg | ORAL_TABLET | Freq: Four times a day (QID) | ORAL | Status: DC | PRN
  Administered 2018-08-29 – 2018-08-31 (×5): 50 mg via ORAL
  Filled 2018-08-29 (×6): qty 1

## 2018-08-29 MED ORDER — SODIUM CHLORIDE 0.9% FLUSH
3.0000 mL | Freq: Two times a day (BID) | INTRAVENOUS | Status: DC
  Administered 2018-08-29 – 2018-09-01 (×7): 3 mL via INTRAVENOUS

## 2018-08-29 MED ORDER — IOPAMIDOL (ISOVUE-370) INJECTION 76%: 100.0000 mL | Freq: Once | INTRAVENOUS | Status: DC | PRN

## 2018-08-29 MED ORDER — ONDANSETRON HCL 4 MG/2ML IJ SOLN: 4.0000 mg | Freq: Four times a day (QID) | INTRAMUSCULAR | Status: DC | PRN

## 2018-08-29 MED ORDER — ONDANSETRON HCL 4 MG PO TABS: 4.0000 mg | ORAL_TABLET | Freq: Four times a day (QID) | ORAL | Status: DC | PRN

## 2018-08-29 MED ORDER — ACETAMINOPHEN 325 MG PO TABS: 650.0000 mg | ORAL_TABLET | Freq: Four times a day (QID) | ORAL | Status: DC | PRN

## 2018-08-29 MED ORDER — PANTOPRAZOLE SODIUM 40 MG IV SOLR
40.0000 mg | Freq: Two times a day (BID) | INTRAVENOUS | Status: DC
  Administered 2018-08-29 – 2018-09-01 (×6): 40 mg via INTRAVENOUS
  Filled 2018-08-29 (×6): qty 40

## 2018-08-29 MED ORDER — SODIUM CHLORIDE 0.9 % IV SOLN
INTRAVENOUS | Status: DC
  Administered 2018-08-29 – 2018-08-30 (×4): via INTRAVENOUS

## 2018-08-29 MED ORDER — IOPAMIDOL (ISOVUE-370) INJECTION 76%
INTRAVENOUS | Status: AC
  Administered 2018-08-29: 100 mL via INTRAVENOUS
  Filled 2018-08-29: qty 100

## 2018-08-29 MED ORDER — SODIUM CHLORIDE 0.9 % IV SOLN
10.0000 mL/h | Freq: Once | INTRAVENOUS | Status: AC
  Administered 2018-08-29: 10 mL/h via INTRAVENOUS

## 2018-08-29 NOTE — ED Provider Notes (Addendum)
[place] EMERGENCY DEPARTMENT Provider Note   CSN: [id number] Arrival date & time: 08/29/18  0825     History   Chief Complaint Chief Complaint  Patient presents with  . Shortness of Breath  . Rectal Bleeding    HPI Patricia Murillo is a 72 y.o. female.  HPI Patient is a 72 year old female presents to the emergency department with complaints of shortness of breath generalized weakness and dark black stools over the past several days.  She was recently started on anticoagulation for new left upper extremity swelling concerning for possible DVT.  She was started on Eliquis on Friday.  Her last dose of Eliquis was yesterday morning.  She did not take a dose this morning or last night.  Patient did undergo pacemaker placement in her right chest in July 2019.  She is been struggling with left upper extremity swelling for several weeks and new swelling of her left anterior chest where they had attempted to initially place the pacemaker.   Past Medical History:  Diagnosis Date  . Arthritis   . Hiatal hernia   . History of chicken pox   . Hyperlipidemia   . Pacemaker     Patient Active Problem List   Diagnosis Date Noted  . GI bleeding 08/29/2018  . Complete heart block ([place]) 04/18/2018  . Allergic conjunctivitis of left eye 06/25/2017  . Rash of hands 10/12/2016  . Right ankle pain 08/31/2016  . Arthritis involving multiple sites 06/01/2016  . Skin abscess 02/28/2015  . 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 Hyperlipidemia 12/15/2012  . Vitamin D deficiency 12/15/2012  . Arthralgia of wrist 12/15/2012  . Arthralgia of knee 12/15/2012  . GERD (gastroesophageal reflux disease) 12/15/2012  . Hyperglycemia 09/12/2012    Past Surgical History:  Procedure Laterality Date  . ANKLE SURGERY    . PACEMAKER IMPLANT N/A 04/18/2018   Procedure: PACEMAKER IMPLANT;  Surgeon: Evans Lance, MD;  Location: [place] [facility];  Service: Cardiovascular;   Laterality: N/A;     OB History   None      Home Medications    Prior to Admission medications   Medication Sig Start Date End Date Taking? Authorizing Provider  acetaminophen (TYLENOL) 650 MG CR tablet Take 1,300 mg by mouth every 8 (eight) hours as needed for pain.   Yes [provider]  azelastine (OPTIVAR) 0.05 % ophthalmic solution Place 1 drop into both eyes 2 (two) times daily. Patient taking differently: Place 1 drop into both eyes 2 (two) times daily as needed (vertigo).  06/25/17  Yes Golden Circle, FNP  meclizine (ANTIVERT) 12.5 MG tablet Take 1 tablet (12.5 mg total) by mouth 3 (three) times daily as needed for dizziness. 07/15/17  Yes Mesner, Corene Cornea, MD  Vitamin D, Cholecalciferol, 50 MCG (2000 UT) CAPS Take 2,000 Units by mouth daily.   Yes [provider]    Family History Family History  Problem Relation Age of Onset  . Hypertension Mother   . Arthritis Mother   . Arthritis Father   . Cancer Sister        Breast Cancer  . Heart disease Paternal Uncle     Social History Social History   Tobacco Use  . Smoking status: Former Smoker    Last attempt to quit: 10/05/2001    Years since quitting: 16.9  . Smokeless tobacco: Never Used  Substance Use Topics  . Alcohol use: No  . Drug use: No  Allergies   Patient has no known allergies.   Review of Systems Review of Systems  All other systems reviewed and are negative.    Physical Exam Updated Vital Signs BP (!) 105/54   Pulse 73   Temp 98.1 F (36.7 C) (Oral)   Resp (!) 27   SpO2 100%   Physical Exam  Constitutional: She is oriented to person, place, and time. She appears well-developed and well-nourished. No distress.  Pale appearing  HENT:  Head: Normocephalic and atraumatic.  Eyes: EOM are normal.  Neck: Normal range of motion.  Cardiovascular: Normal rate, regular rhythm and normal heart sounds.  Pulmonary/Chest: Effort normal and breath sounds normal.    Abdominal: Soft. She exhibits no distension. There is no tenderness.  Musculoskeletal: Normal range of motion.  Neurological: She is alert and oriented to person, place, and time.  Skin: Skin is warm and dry.  Psychiatric: She has a normal mood and affect. Judgment normal.  Nursing note and vitals reviewed.    ED Treatments / Results  Labs (all labs ordered are listed, but only abnormal results are displayed) Labs Reviewed  CBC - Abnormal; Notable for the following components:      Result Value   WBC 14.4 (*)    RBC 2.09 (*)    Hemoglobin 5.0 (*)    HCT 18.2 (*)    MCH 23.9 (*)    MCHC 27.5 (*)    RDW 22.5 (*)    All other components within normal limits  COMPREHENSIVE METABOLIC PANEL - Abnormal; Notable for the following components:   Glucose, Bld 135 (*)    Creatinine, Ser 1.04 (*)    Calcium 10.6 (*)    Total Protein 5.2 (*)    Albumin 2.5 (*)    Total Bilirubin 1.3 (*)    GFR calc non Af Amer 53 (*)    All other components within normal limits  TROPONIN I - Abnormal; Notable for the following components:   Troponin I 0.04 (*)    All other components within normal limits  POC OCCULT BLOOD, ED - Abnormal; Notable for the following components:   Fecal Occult Bld POSITIVE (*)    All other components within normal limits  CBC  TYPE AND SCREEN  PREPARE RBC (CROSSMATCH)  ABO/RH    EKG EKG Interpretation  Date/Time:  Monday August 29 2018 08:32:38 EST Ventricular Rate:  78 PR Interval:    QRS Duration: 75 QT Interval:  474 QTC Calculation: 540 R Axis:   19 Text Interpretation:  Atrial fibrillation Ventricular premature complex Low voltage, precordial leads Minimal ST depression, inferior leads Prolonged QT interval currently not paced Confirmed by Jola Schmidt [phone]) on 08/29/2018 8:57:40 AM   Radiology Dg Chest Portable 1 View  Result Date: 08/29/2018 CLINICAL DATA:  72 year old female with a history of shortness of breath and edema EXAM: PORTABLE CHEST  1 VIEW COMPARISON:  04/19/2018, 04/18/2018 FINDINGS: Cardiomediastinal silhouette unchanged in size and contour. Double density projects over the lower mediastinum is unchanged. On the frontal view there is a Passy at the left base obscuring the left hemidiaphragm with blunting of left costophrenic angle, increased from the prior. No pneumothorax. Unchanged right chest wall cardiac pacing device with 2 leads in place. No confluent airspace disease on the right. IMPRESSION: New opacity at the left lung base obscuring left hemidiaphragm in the left heart border, compatible with pleural effusion and associated atelectasis/consolidation. Hiatal hernia. Electronically Signed   By: Corrie Mckusick D.O.  On: 08/29/2018 09:09    Procedures .Critical Care Performed by: Jola Schmidt, MD Authorized by: Jola Schmidt, MD    CRITICAL CARE Performed by: Jola Schmidt Total critical care time: 35 minutes Critical care time was exclusive of separately billable procedures and treating other patients. Critical care was necessary to treat or prevent imminent or life-threatening deterioration. Critical care was time spent personally by me on the following activities: development of treatment plan with patient and/or surrogate as well as nursing, discussions with consultants, evaluation of patient's response to treatment, examination of patient, obtaining history from patient or surrogate, ordering and performing treatments and interventions, ordering and review of laboratory studies, ordering and review of radiographic studies, pulse oximetry and re-evaluation of patient's condition.   Medications Ordered in ED Medications  acetaminophen (TYLENOL) tablet 650 mg (has no administration in time range)    Or  acetaminophen (TYLENOL) suppository 650 mg (has no administration in time range)  ondansetron (ZOFRAN) tablet 4 mg (has no administration in time range)    Or  ondansetron (ZOFRAN) injection 4 mg (has no  administration in time range)  0.9 %  sodium chloride infusion (has no administration in time range)  pantoprazole (PROTONIX) injection 40 mg (has no administration in time range)  sodium chloride flush (NS) 0.9 % injection 3 mL (has no administration in time range)  0.9 %  sodium chloride infusion (0 mL/hr Intravenous Stopped 08/29/18 1134)     Initial Impression / Assessment and Plan / ED Course  I have reviewed the triage vital signs and the nursing notes.  Pertinent labs & imaging results that were available during my care of the patient were reviewed by me and considered in my medical decision making (see chart for details).     Fecal occult positive.  Melanotic stool.  Recently started on anticoagulation.  Patient with a hemoglobin of 5.  Blood transfusion now.  Hospitalist admission.  GI consultation for melanotic stools in the setting of anticoagulation.  Will withhold anticoagulation at this time.  In regards to her left upper extremity swelling her ultrasound is negative for DVT in the left upper extremity today.  She does have profound lymphedema of her left upper extremity and this may be secondary to compression in her left anterior chest.  She will undergo CT imaging of her chest for further evaluation.  The CT scan is to be ordered by the admitting team.  Case will be discussed with [facility].   Final Clinical Impressions(s) / ED Diagnoses   Final diagnoses:  Symptomatic anemia  Left upper extremity swelling    ED Discharge Orders    None       Jola Schmidt, MD 08/29/18 1223    Jola Schmidt, MD 08/29/18 1224

## 2018-08-29 NOTE — ED Notes (Signed)
Dr. Yates hospitalist at the bedside.  

## 2018-08-29 NOTE — ED Notes (Signed)
Patient transported to vascular. 

## 2018-08-29 NOTE — Consult Note (Signed)
Referring Provider:  Karmen Bongo, [facility] Primary Care Physician:  Patient, No Pcp Per Primary Gastroenterologist:  Dr. Oletta Lamas  Reason for Consultation: Profound anemia, on Eliquis  HPI: Patricia Murillo is a 72 y.o. female who was started on Eliquis about 5 days ago for a possible DVT of the left upper extremity following attempted pacemaker insertion (had to be placed on the right side due to apparent anatomic limitations on the left side).  She has been subsequently found to be without a DVT and the plan is to stop her Eliquis.  She reports dark stools and came in today with a hemoglobin of 5.0, MCV 87 as compared to a previous hemoglobin of 11.6 when checked 4 months earlier.  In the emergency room, her stool was noted to be melenic in character and was confirmed to be Hemoccult positive.  Of note, the patient presented with hemoglobin of 3.9 in 2009 without any overt GI bleeding at that time.  Upper endoscopy by Dr. Leonie Douglas in August 2009 showed a large hiatal hernia, and colonoscopy showed some left-sided diverticulosis.  She was subsequently lost to follow-up.  Meanwhile, the patient has had a chest CT checking for pulmonary embolism, which was negative, but she is found to have multiple lesions in the lungs suggestive of metastases, as well as axillary adenopathy.   Past Medical History:  Diagnosis Date  . Arthritis   . Hiatal hernia   . History of chicken pox   . Hyperlipidemia   . Pacemaker     Past Surgical History:  Procedure Laterality Date  . ANKLE SURGERY    . PACEMAKER IMPLANT N/A 04/18/2018   Procedure: PACEMAKER IMPLANT;  Surgeon: Evans Lance, MD;  Location: [facility];  Service: Cardiovascular;  Laterality: N/A;    Prior to Admission medications   Medication Sig Start Date End Date Taking? Authorizing Provider  acetaminophen (TYLENOL) 650 MG CR tablet Take 1,300 mg by mouth every 8 (eight) hours as needed for pain.   Yes [provider]  azelastine (OPTIVAR) 0.05 % ophthalmic solution Place 1 drop into both eyes 2 (two) times daily. Patient taking differently: Place 1 drop into both eyes 2 (two) times daily as needed (vertigo).  06/25/17  Yes Golden Circle, FNP  meclizine (ANTIVERT) 12.5 MG tablet Take 1 tablet (12.5 mg total) by mouth 3 (three) times daily as needed for dizziness. 07/15/17  Yes Mesner, Corene Cornea, MD  Vitamin D, Cholecalciferol, 50 MCG (2000 UT) CAPS Take 2,000 Units by mouth daily.   Yes [provider]    Current Facility-Administered Medications  Medication Dose Route Frequency Provider Last Rate Last Dose  . 0.9 %  sodium chloride infusion   Intravenous Continuous Karmen Bongo, MD 125 mL/hr at 08/29/18 1608    . acetaminophen (TYLENOL) tablet 650 mg  650 mg Oral Q6H PRN Karmen Bongo, MD       Or  . acetaminophen (TYLENOL) suppository 650 mg  650 mg Rectal Q6H PRN Karmen Bongo, MD      . iopamidol (ISOVUE-370) 76 % injection 100 mL  100 mL Intravenous Once PRN Karmen Bongo, MD      . ondansetron [facility]) tablet 4 mg  4 mg Oral Q6H PRN Karmen Bongo, MD       Or  . ondansetron [facility]) injection 4 mg  4 mg Intravenous Q6H PRN Karmen Bongo, MD      . pantoprazole (PROTONIX) injection 40 mg  40 mg Intravenous Q12H Yates,  Anderson Malta, MD      . sodium chloride flush (NS) 0.9 % injection 3 mL  3 mL Intravenous Q12H Karmen Bongo, MD   3 mL at 08/29/18 1615    Allergies as of 08/29/2018  . (No Known Allergies)    Family History  Problem Relation Age of Onset  . Hypertension Mother   . Arthritis Mother   . Arthritis Father   . Cancer Sister        Breast Cancer  . Heart disease Paternal Uncle     Social History   Socioeconomic History  . Marital status: Divorced    Spouse name: Not on file  . Number of children: 3  . Years of education: 59  . Highest education level: Not on file  Occupational History  . Occupation: Retired  Scientific laboratory technician  . Financial  resource strain: Not on file  . Food insecurity:    Worry: Not on file    Inability: Not on file  . Transportation needs:    Medical: Not on file    Non-medical: Not on file  Tobacco Use  . Smoking status: Former Smoker    Last attempt to quit: 10/05/2001    Years since quitting: 16.9  . Smokeless tobacco: Never Used  Substance and Sexual Activity  . Alcohol use: No  . Drug use: No  . Sexual activity: Not Currently  Lifestyle  . Physical activity:    Days per week: Not on file    Minutes per session: Not on file  . Stress: Not on file  Relationships  . Social connections:    Talks on phone: Not on file    Gets together: Not on file    Attends religious service: Not on file    Active member of club or organization: Not on file    Attends meetings of clubs or organizations: Not on file    Relationship status: Not on file  . Intimate partner violence:    Fear of current or ex partner: Not on file    Emotionally abused: Not on file    Physically abused: Not on file    Forced sexual activity: Not on file  Other Topics Concern  . Not on file  Social History Narrative   Regular exercise-no   Caffeine Use-yes    Review of Systems: The patient does experience some intermittent dysphagia and regurgitation associated with her hiatal hernia  Physical Exam: Vital signs in last 24 hours: Temp:  [98.1 F (36.7 C)-99.5 F (37.5 C)] 99.5 F (37.5 C) (11/25 1634) Pulse Rate:  [71-105] 81 (11/25 1634) Resp:  [18-36] 18 (11/25 1600) BP: (100-140)/(44-100) 136/53 (11/25 1634) SpO2:  [99 %-100 %] 100 % (11/25 1634)   This is a very pale patient, lying flat on the ER stretcher in no acute distress.  She is anicteric.  Chest clear, heart without murmur or arrhythmia appreciated, abdomen without evident mass or tenderness.  Lower extremities are slightly puffy but without pitting edema.  There is edema of the left arm.  No evident focal neurologic deficits.  Cognition appears to be intact.   No skin rashes are seen.  No overt musculoskeletal deformities are appreciated.  Intake/Output from previous day: No intake/output data recorded. Intake/Output this shift: Total I/O In: 386 [I.V.:60; Blood:326] Out: -   Lab Results: Recent Labs    08/29/18 0835  WBC 14.4*  HGB 5.0*  HCT 18.2*  PLT 313   BMET Recent Labs    08/29/18 0835  NA 136  K 4.3  CL 100  CO2 26  GLUCOSE 135*  BUN 23  CREATININE 1.04*  CALCIUM 10.6*   LFT Recent Labs    08/29/18 0835  PROT 5.2*  ALBUMIN 2.5*  AST 15  ALT 10  ALKPHOS 77  BILITOT 1.3*   PT/INR No results for input(s): LABPROT, INR in the last 72 hours.  Studies/Results: Ct Angio Chest Pe W Or Wo Contrast  Result Date: 08/29/2018 CLINICAL DATA:  Severe left arm swelling since yesterday. Hemoglobin L5. EXAM: CT ANGIOGRAPHY CHEST WITH CONTRAST TECHNIQUE: Multidetector CT imaging of the chest was performed using the standard protocol during bolus administration of intravenous contrast. Multiplanar CT image reconstructions and MIPs were obtained to evaluate the vascular anatomy. CONTRAST:  100 cc ISOVUE-370 IOPAMIDOL (ISOVUE-370) INJECTION 76% COMPARISON:  CXR 08/29/2018 FINDINGS: Cardiovascular: Conventional branch pattern of the great vessels with minimal atherosclerosis at the origins. No significant stenosis. Patent left subclavian artery traversing an area of ill-defined soft tissue edema and enlargement as described below. Mild aortic atherosclerosis without aneurysm or dissection. Satisfactory opacification of the pulmonary arteries to the proximal segmental level without large central pulmonary embolus. Mediastinum/Nodes: Left axillary nodal enlargement is identified ranging size from 0.3 cm to 2.8 cm short axis. Mildly enlarged right-sided axillary lymph nodes up to 1.3 cm short axis are identified. Large hiatal hernia with air-fluid level noted within. 1.4 cm short axis mass abutting the left lateral aspect of the aortic arch  in the prevascular space is noted more likely to represent a pulmonary lesion as opposed lymphadenopathy. Nonpathologic sized mediastinal and hilar lymph nodes are noted. Lungs/Pleura: Masslike opacities are identified in the lingula, the largest abutting the pleural surface measuring 3.3 x 1.7 cm on series 5/92 with previously mentioned paramediastinal mass in the left upper lobe measuring 2.3 x 1.4 cm, series 5/49. Superior segment left lower lobe mass measuring 1.8 x 1.3 cm, series 5/52 is also noted. There are smaller subcentimeter nodules within both upper lobes, right middle lobe and right lower lobe with largest masslike opacities in the right lower lobe measuring 1.1 x 1 cm, series 5/89 and abutting the right lateral margin of the hiatal hernia measuring 2.5 x 1.9 cm, series 5/105. Moderate left effusion with compressive atelectasis is identified with trace right pleural effusion. No pneumothorax. Upper Abdomen: No space-occupying mass of the included liver. The included right adrenal gland is unremarkable. Nodular soft tissue densities in the left upper quadrant are felt to represent partial imaging of small bowel less likely mesenteric adenopathy. The included spleen is top-normal in size. Musculoskeletal: Ill-defined asymmetric soft tissue enlargement in the region of the left pectoralis muscle suspicious for intramuscular hemorrhage and edema given reported rapid onset of left arm swelling and clinical report of low hemoglobin. Asymmetric subcutaneous edema of the partially included left breast with thickening of the skin concerning for possible possible inflammatory carcinoma of the breast. Right-sided pacemaker apparatus is noted with leads in the right atrium right ventricle. Thoracic spondylosis is noted. Review of the MIP images confirms the above findings. IMPRESSION: 1. No acute large central pulmonary embolus, aortic aneurysm or dissection. 2. Multilobar masslike opacities scattered throughout  both lungs as above described, the largest in the lingula measuring up to 3.3 cm, presumed metastatic given left axillary adenopathy also noted. 3. Left axillary nodal enlargement measuring up to 2.8 cm short axis. 4. Asymmetric soft tissue enlargement of the left pectoralis muscle suspicious for intramuscular hemorrhage and edema given reported rapid onset of  left arm swelling and clinical report of low hemoglobin. An infiltrating mass or metastasis is also a differential considerations given local adenopathy, asymmetric subcutaneous edema of the partially included left breast with thickening of the skin concerning for possible inflammatory carcinoma of the breast. Clinical correlation is therefore recommended as well as mammographic correlation. 5. Moderate left and trace right pleural effusions with compressive atelectasis. These results were called by telephone at the time of interpretation on 08/29/2018 at 3:43 pm to Dr. Karmen Bongo , who verbally acknowledged these results. Aortic Atherosclerosis (ICD10-I70.0). Electronically Signed   By: Ashley Royalty M.D.   On: 08/29/2018 15:43   Dg Chest Portable 1 View  Result Date: 08/29/2018 CLINICAL DATA:  72 year old female with a history of shortness of breath and edema EXAM: PORTABLE CHEST 1 VIEW COMPARISON:  04/19/2018, 04/18/2018 FINDINGS: Cardiomediastinal silhouette unchanged in size and contour. Double density projects over the lower mediastinum is unchanged. On the frontal view there is a Passy at the left base obscuring the left hemidiaphragm with blunting of left costophrenic angle, increased from the prior. No pneumothorax. Unchanged right chest wall cardiac pacing device with 2 leads in place. No confluent airspace disease on the right. IMPRESSION: New opacity at the left lung base obscuring left hemidiaphragm in the left heart border, compatible with pleural effusion and associated atelectasis/consolidation. Hiatal hernia. Electronically Signed   By:  Corrie Mckusick D.O.   On: 08/29/2018 09:09   Ue Venous Duplex ([facility] Only)  Result Date: 08/29/2018 UPPER VENOUS STUDY  Indications: Pain (shoulder and elbow) Risk Factors: Failed pacemaker placement in left chest 04/2018. Limitations: Body habitus, swelling and poor ultrasound/tissue interface. Comparison Study: No prior study on file for comparison. Performing Technologist: Sharion Dove RVS  Examination Guidelines: A complete evaluation includes B-mode imaging, spectral Doppler, color Doppler, and power Doppler as needed of all accessible portions of each vessel. Bilateral testing is considered an integral part of a complete examination. Limited examinations for reoccurring indications may be performed as noted.  Right Findings: +----------+------------+----------+---------+-----------+-------+ RIGHT     CompressiblePropertiesPhasicitySpontaneousSummary +----------+------------+----------+---------+-----------+-------+ Subclavian                         Yes       Yes            +----------+------------+----------+---------+-----------+-------+  Left Findings: +----------+------------+----------+---------+-----------+-------+ LEFT      CompressiblePropertiesPhasicitySpontaneousSummary +----------+------------+----------+---------+-----------+-------+ IJV                                Yes       Yes            +----------+------------+----------+---------+-----------+-------+ Subclavian                         Yes       Yes            +----------+------------+----------+---------+-----------+-------+ Axillary      Full                                          +----------+------------+----------+---------+-----------+-------+ Brachial      Full                                          +----------+------------+----------+---------+-----------+-------+  Cephalic      Full                                           +----------+------------+----------+---------+-----------+-------+ Basilic       Full                                          +----------+------------+----------+---------+-----------+-------+  Summary:  Right: No evidence of thrombosis in the subclavian.  Left: No evidence of deep vein thrombosis in the upper extremity. However, unable to visualize the radial and ulnar. No evidence of superficial vein thrombosis in the upper extremity. However, unable to visualize the radial and ulnar. No evidence of thrombosis in the . However, unable to visualize the radial and ulnar. This was a limited study.  *See table(s) above for measurements and observations.  Diagnosing physician: Deitra Mayo MD Electronically signed by Deitra Mayo MD on 08/29/2018 at 4:20:56 PM.    Final     Impression: 1.  Heme positive, melenic stool since going on Eliquis 5 days ago 2.  Profound anemia, presumably acute-on-chronic since she really does not have symptoms of hemodynamic instability, making it unlikely that the 6 g drop in hemoglobin has occurred just since the Eliquis was started 5 days ago. 3.  Previous history of profound anemia with hemoglobin 3.9 in 2009 with endoscopy and colonoscopy showing no source other than her very large hiatal hernia. 4.  Current chest CT showing probable metastatic disease in the lungs  Plan: 1.  Since the patient is not showing signs of acute bleeding, and since it is unlikely that she will be maintained on anticoagulation, there is no urgent need for endoscopic or colonoscopic evaluation.  If she shows no evidence of further blood loss now that her Eliquis is stopped, it would not be mandatory to pursue GI work-up for her bleeding. 2.  I am quite sure that she has chronic anemia due to her large hiatal hernia, exacerbated by an element of blood loss from anticoagulation, probably from some Cameron ulcerations of the stomach associated with a large hiatal hernia. 3.   Agree with PPI therapy 4.  Once stabilized from the standpoint of her anemia, we could consider endoscopy and colonoscopy to look for a primary source of her (presumed) lung metastases, IF desired by oncology.  However, if tissue is obtained which demonstrates a non-GI source, I tend to think that it would be neither necessary nor appropriate to put this patient through an aggressive GI work-up. 5.  Okay to allow diet, from GI tract standpoint, since it does not appear we will be doing endoscopy or colonoscopy in the next day or 2 (see above discussion).   LOS: 0 days   Patricia Murillo  08/29/2018, 5:37 PM   Pager [phone] If no answer or after 5 PM call [phone]

## 2018-08-29 NOTE — ED Triage Notes (Addendum)
Pt here c/o shortness of breath, generalized edema, and dark stools. Pt takes eloquis. Reports not feeling well the last couple of weeks with difficulty doing ADLs. Skin cool and dry. Pt appears to be very pale. VSS for EMS. Hx heart cath and pacemaker insertion in July 2019.

## 2018-08-29 NOTE — ED Notes (Signed)
Patient is tolerating blood transfusion well. No complaints. Will continue to monitor.

## 2018-08-29 NOTE — H&P (Signed)
History and Physical    Patricia Murillo JHE:[id number] DOB: 1946/01/14 DOA: 08/29/2018  PCP: Patient, No Pcp Per Consultants:  Lovena Le - cardiology Patient coming from:  Home - lives alone; NOK: Rush Farmer, [phone]  Chief Complaint: Fatigue  HPI: Patricia Murillo is a 72 y.o. female with medical history significant of HLD and complete heart block s/p pacemaker placement in 7/19 presenting with fatigue.  She has had arm pain and been nauseated with heaves which has resolved.  EP team tried to place a pacemaker on the left on 7/15 without success; since then her arm has had increasing swelling.  They were concerned about a blood clot on Friday and started her on Eliquis.  She took her first dose Friday night and she developed dark stools on Saturday.  She stopped the Eliquis Sunday.  She does not report known h/o afib and has not bene on [facility] before.  She has been weak and SOB with exertion.  No prior h/o transfusion.  She has had colonoscopy x 2, most recent in 2008.   ED Course:  Started on Eliquis Friday for suspected UE DVT due to progressive LUE edema.  Black stools since Friday - no Eliquis since Sunday AM.  Came in for weakness, dizziness, SOB.  Transfuse 2 units PRBC.  [place] is negative for DVT, but with profound lymphedema - does she need a CT to cause compressive lymph systems.  Review of Systems: As per HPI; otherwise review of systems reviewed and negative.   Ambulatory Status:  Ambulates without assistance  Past Medical History:  Diagnosis Date  . Arthritis   . Hiatal hernia   . History of chicken pox   . Hyperlipidemia   . Pacemaker     Past Surgical History:  Procedure Laterality Date  . ANKLE SURGERY    . PACEMAKER IMPLANT N/A 04/18/2018   Procedure: PACEMAKER IMPLANT;  Surgeon: Evans Lance, MD;  Location: [facility];  Service: Cardiovascular;  Laterality: N/A;    Social History   Socioeconomic History  . Marital status: Divorced    Spouse name:  Not on file  . Number of children: 3  . Years of education: 4  . Highest education level: Not on file  Occupational History  . Occupation: Retired  Scientific laboratory technician  . Financial resource strain: Not on file  . Food insecurity:    Worry: Not on file    Inability: Not on file  . Transportation needs:    Medical: Not on file    Non-medical: Not on file  Tobacco Use  . Smoking status: Former Smoker    Last attempt to quit: 10/05/2001    Years since quitting: 16.9  . Smokeless tobacco: Never Used  Substance and Sexual Activity  . Alcohol use: No  . Drug use: No  . Sexual activity: Not Currently  Lifestyle  . Physical activity:    Days per week: Not on file    Minutes per session: Not on file  . Stress: Not on file  Relationships  . Social connections:    Talks on phone: Not on file    Gets together: Not on file    Attends religious service: Not on file    Active member of club or organization: Not on file    Attends meetings of clubs or organizations: Not on file    Relationship status: Not on file  . Intimate partner violence:    Fear of current or ex partner: Not on  file    Emotionally abused: Not on file    Physically abused: Not on file    Forced sexual activity: Not on file  Other Topics Concern  . Not on file  Social History Narrative   Regular exercise-no   Caffeine Use-yes    No Known Allergies  Family History  Problem Relation Age of Onset  . Hypertension Mother   . Arthritis Mother   . Arthritis Father   . Cancer Sister        Breast Cancer  . Heart disease Paternal Uncle     Prior to Admission medications   Medication Sig Start Date End Date Taking? Authorizing Provider  acetaminophen (TYLENOL) 650 MG CR tablet Take 1,300 mg by mouth every 8 (eight) hours as needed for pain.   Yes [provider]  azelastine (OPTIVAR) 0.05 % ophthalmic solution Place 1 drop into both eyes 2 (two) times daily. Patient taking differently: Place 1 drop into both  eyes 2 (two) times daily as needed (vertigo).  06/25/17  Yes Golden Circle, FNP  meclizine (ANTIVERT) 12.5 MG tablet Take 1 tablet (12.5 mg total) by mouth 3 (three) times daily as needed for dizziness. 07/15/17  Yes Mesner, Corene Cornea, MD  Vitamin D, Cholecalciferol, 50 MCG (2000 UT) CAPS Take 2,000 Units by mouth daily.   Yes [provider]  apixaban (ELIQUIS) 5 MG TABS tablet Take 1 tablet (5 mg total) by mouth 2 (two) times daily. Patient not taking: Reported on 08/29/2018 08/26/18   Evans Lance, MD  ranitidine (ZANTAC) 150 MG tablet Take 1 tablet (150 mg total) by mouth 2 (two) times daily. Patient not taking: Reported on 08/29/2018 06/25/17   Golden Circle, FNP    Physical Exam: Vitals:   08/29/18 1445 08/29/18 1551 08/29/18 1600 08/29/18 1634  BP: (!) 112/58 (!) 140/51 (!) 136/53 (!) 136/53  Pulse: 73 80 82 81  Resp: (!) 34 18 18   Temp:  98.3 F (36.8 C) 98.5 F (36.9 C) 99.5 F (37.5 C)  TempSrc:  Oral Axillary Oral  SpO2: 100% 100% 100% 100%     General:  Appears calm and comfortable and is NAD but does appear to be fatigued Eyes:  PERRL, EOMI, normal lids, iris ENT:  grossly normal hearing, lips & tongue, mmm Neck:  no LAD, masses or thyromegaly; no carotid bruits Cardiovascular:  RRR, no m/r/g. No LE edema.  Respiratory:   CTA bilaterally with no wheezes/rales/rhonchi.  Normal respiratory effort. Abdomen:  soft, NT, ND, NABS Back:   normal alignment, no CVAT Skin:  no rash or induration seen on limited exam Musculoskeletal: marked lymphedema of the LUE with edema along the supraclavicular region proximal to the site of pacer attempt on the left chest and along the left lateral breast Psychiatric:  blunted mood and affect, speech fluent and appropriate, AOx3 Neurologic:  CN 2-12 grossly intact, moves all extremities in coordinated fashion, sensation intact    Radiological Exams on Admission: Ct Angio Chest Pe W Or Wo Contrast  Result Date:  08/29/2018 CLINICAL DATA:  Severe left arm swelling since yesterday. Hemoglobin L5. EXAM: CT ANGIOGRAPHY CHEST WITH CONTRAST TECHNIQUE: Multidetector CT imaging of the chest was performed using the standard protocol during bolus administration of intravenous contrast. Multiplanar CT image reconstructions and MIPs were obtained to evaluate the vascular anatomy. CONTRAST:  100 cc ISOVUE-370 IOPAMIDOL (ISOVUE-370) INJECTION 76% COMPARISON:  CXR 08/29/2018 FINDINGS: Cardiovascular: Conventional branch pattern of the great vessels with minimal atherosclerosis at the  origins. No significant stenosis. Patent left subclavian artery traversing an area of ill-defined soft tissue edema and enlargement as described below. Mild aortic atherosclerosis without aneurysm or dissection. Satisfactory opacification of the pulmonary arteries to the proximal segmental level without large central pulmonary embolus. Mediastinum/Nodes: Left axillary nodal enlargement is identified ranging size from 0.3 cm to 2.8 cm short axis. Mildly enlarged right-sided axillary lymph nodes up to 1.3 cm short axis are identified. Large hiatal hernia with air-fluid level noted within. 1.4 cm short axis mass abutting the left lateral aspect of the aortic arch in the prevascular space is noted more likely to represent a pulmonary lesion as opposed lymphadenopathy. Nonpathologic sized mediastinal and hilar lymph nodes are noted. Lungs/Pleura: Masslike opacities are identified in the lingula, the largest abutting the pleural surface measuring 3.3 x 1.7 cm on series 5/92 with previously mentioned paramediastinal mass in the left upper lobe measuring 2.3 x 1.4 cm, series 5/49. Superior segment left lower lobe mass measuring 1.8 x 1.3 cm, series 5/52 is also noted. There are smaller subcentimeter nodules within both upper lobes, right middle lobe and right lower lobe with largest masslike opacities in the right lower lobe measuring 1.1 x 1 cm, series 5/89 and  abutting the right lateral margin of the hiatal hernia measuring 2.5 x 1.9 cm, series 5/105. Moderate left effusion with compressive atelectasis is identified with trace right pleural effusion. No pneumothorax. Upper Abdomen: No space-occupying mass of the included liver. The included right adrenal gland is unremarkable. Nodular soft tissue densities in the left upper quadrant are felt to represent partial imaging of small bowel less likely mesenteric adenopathy. The included spleen is top-normal in size. Musculoskeletal: Ill-defined asymmetric soft tissue enlargement in the region of the left pectoralis muscle suspicious for intramuscular hemorrhage and edema given reported rapid onset of left arm swelling and clinical report of low hemoglobin. Asymmetric subcutaneous edema of the partially included left breast with thickening of the skin concerning for possible possible inflammatory carcinoma of the breast. Right-sided pacemaker apparatus is noted with leads in the right atrium right ventricle. Thoracic spondylosis is noted. Review of the MIP images confirms the above findings. IMPRESSION: 1. No acute large central pulmonary embolus, aortic aneurysm or dissection. 2. Multilobar masslike opacities scattered throughout both lungs as above described, the largest in the lingula measuring up to 3.3 cm, presumed metastatic given left axillary adenopathy also noted. 3. Left axillary nodal enlargement measuring up to 2.8 cm short axis. 4. Asymmetric soft tissue enlargement of the left pectoralis muscle suspicious for intramuscular hemorrhage and edema given reported rapid onset of left arm swelling and clinical report of low hemoglobin. An infiltrating mass or metastasis is also a differential considerations given local adenopathy, asymmetric subcutaneous edema of the partially included left breast with thickening of the skin concerning for possible inflammatory carcinoma of the breast. Clinical correlation is therefore  recommended as well as mammographic correlation. 5. Moderate left and trace right pleural effusions with compressive atelectasis. These results were called by telephone at the time of interpretation on 08/29/2018 at 3:43 pm to Dr. Karmen Bongo , who verbally acknowledged these results. Aortic Atherosclerosis (ICD10-I70.0). Electronically Signed   By: Ashley Royalty M.D.   On: 08/29/2018 15:43   Dg Chest Portable 1 View  Result Date: 08/29/2018 CLINICAL DATA:  72 year old female with a history of shortness of breath and edema EXAM: PORTABLE CHEST 1 VIEW COMPARISON:  04/19/2018, 04/18/2018 FINDINGS: Cardiomediastinal silhouette unchanged in size and contour. Double density projects over the  lower mediastinum is unchanged. On the frontal view there is a Passy at the left base obscuring the left hemidiaphragm with blunting of left costophrenic angle, increased from the prior. No pneumothorax. Unchanged right chest wall cardiac pacing device with 2 leads in place. No confluent airspace disease on the right. IMPRESSION: New opacity at the left lung base obscuring left hemidiaphragm in the left heart border, compatible with pleural effusion and associated atelectasis/consolidation. Hiatal hernia. Electronically Signed   By: Corrie Mckusick D.O.   On: 08/29/2018 09:09   Ue Venous Duplex ([facility] And [facility] Only)  Result Date: 08/29/2018 UPPER VENOUS STUDY  Indications: Pain (shoulder and elbow) Risk Factors: Failed pacemaker placement in left chest 04/2018. Limitations: Body habitus, swelling and poor ultrasound/tissue interface. Comparison Study: No prior study on file for comparison. Performing Technologist: Sharion Dove RVS  Examination Guidelines: A complete evaluation includes B-mode imaging, spectral Doppler, color Doppler, and power Doppler as needed of all accessible portions of each vessel. Bilateral testing is considered an integral part of a complete examination. Limited examinations for reoccurring indications  may be performed as noted.  Right Findings: +----------+------------+----------+---------+-----------+-------+ RIGHT     CompressiblePropertiesPhasicitySpontaneousSummary +----------+------------+----------+---------+-----------+-------+ Subclavian                         Yes       Yes            +----------+------------+----------+---------+-----------+-------+  Left Findings: +----------+------------+----------+---------+-----------+-------+ LEFT      CompressiblePropertiesPhasicitySpontaneousSummary +----------+------------+----------+---------+-----------+-------+ IJV                                Yes       Yes            +----------+------------+----------+---------+-----------+-------+ Subclavian                         Yes       Yes            +----------+------------+----------+---------+-----------+-------+ Axillary      Full                                          +----------+------------+----------+---------+-----------+-------+ Brachial      Full                                          +----------+------------+----------+---------+-----------+-------+ Cephalic      Full                                          +----------+------------+----------+---------+-----------+-------+ Basilic       Full                                          +----------+------------+----------+---------+-----------+-------+  Summary:  Right: No evidence of thrombosis in the subclavian.  Left: No evidence of deep vein thrombosis in the upper extremity. However, unable to visualize the radial and ulnar. No evidence of superficial vein thrombosis in the upper extremity. However, unable to visualize the radial and  ulnar. No evidence of thrombosis in the . However, unable to visualize the radial and ulnar. This was a limited study.  *See table(s) above for measurements and observations.  Diagnosing physician: Deitra Mayo MD Electronically signed by Deitra Mayo MD on 08/29/2018 at 4:20:56 PM.    Final     EKG: Independently reviewed.  Afib with rate 78; nonspecific ST changes with no evidence of acute ischemia   Labs on Admission: I have personally reviewed the available labs and imaging studies at the time of the admission.  Pertinent labs:   Glucose 135 BUN 23/Creatinine 1.04/GFR 53 Calcium 10.6 Troponin 0.04 WBC 14.4 Hgb 5.0; 11.6 in 7/19 Heme positive  Assessment/Plan Principal Problem:   GI bleeding Active Problems:   Hyperlipidemia   Complete heart block (HCC)   Symptomatic anemia   Lymphedema of left arm   GI bleeding with symptomatic anemia -Patient's lightheadedness and fatigue are most likely caused by anemia secondary to GI bleeding.  -Patient was recently (last Friday) started on Eliquis and bleeding appears to have started shortly thereafter. -No recent C-scope or EGD, and she may benefit from one or both of these tests -Hgb 5.0 on admission; transfusing 2 units PRBC and then recheck CBC after to determine if additional units are needed. - will admit to tele bed - GI consulted by ED, will follow up recommendations - NPO for possible EGD - NS at 125 mL/hr when not receiving blood products - Start IV pantoprazole 40 mg bid - Zofran IV for nausea - Avoid NSAIDs and SQ heparin - Maintain IV access (2 large bore IVs if possible). - Monitor closely and follow cbc, transfuse as necessary.  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 Barry Dienes, who recommends oncology consultation for biopsy and initiation of chemo -[facility] specialists for this issue are Drs. Norman Clay, and Magrinat - since Dr. Burr Medico is on call tomorrow, will defer consult to the day team tomorrow. -I have discussed this issue with the patient.  CHB s/p pacemaker -Pacemaker placed with failed attempt on left  HLD -She does not appear to be taking medication for this issue at this time -This is not her most pressing issue currently  DVT prophylaxis:   SCDs Code Status:  DNR - confirmed with patient/family Family Communication: Daughters present throughout evaluation  Disposition Plan:  Home once clinically improved Consults called: GI; will need oncology tomorrow  Admission status: Admit - It is my clinical opinion that admission to INPATIENT is reasonable and necessary because of the expectation that this patient will require hospital care that crosses at least 2 midnights to treat this condition based on the medical complexity of the problems presented.  Given the aforementioned information, the predictability of an adverse outcome is felt to be significant.    Karmen Bongo MD [facility]  If note is complete, please contact covering daytime or nighttime physician. [email or web address] Password [facility]  08/29/2018, 5:44 PM

## 2018-08-29 NOTE — Progress Notes (Signed)
VASCULAR LAB PRELIMINARY  PRELIMINARY  PRELIMINARY  PRELIMINARY  Left upper extremity venous duplex completed.    Preliminary report:  There is no obvious evidence of DVT or SVT noted in the visualized veins of the left upper extremity.   Called report to Dr. Otilio Jefferson, [facility], RVT 08/29/2018, 9:58 AM

## 2018-08-30 ENCOUNTER — Inpatient Hospital Stay (HOSPITAL_COMMUNITY): Payer: Medicare HMO

## 2018-08-30 ENCOUNTER — Encounter (HOSPITAL_COMMUNITY): Payer: Self-pay

## 2018-08-30 DIAGNOSIS — C799 Secondary malignant neoplasm of unspecified site: Secondary | ICD-10-CM

## 2018-08-30 DIAGNOSIS — K449 Diaphragmatic hernia without obstruction or gangrene: Secondary | ICD-10-CM

## 2018-08-30 DIAGNOSIS — M7989 Other specified soft tissue disorders: Secondary | ICD-10-CM

## 2018-08-30 DIAGNOSIS — R918 Other nonspecific abnormal finding of lung field: Secondary | ICD-10-CM

## 2018-08-30 LAB — BASIC METABOLIC PANEL
Anion gap: 9 (ref 5–15)
BUN: 19 mg/dL (ref 8–23)
CHLORIDE: 100 mmol/L (ref 98–111)
CO2: 24 mmol/L (ref 22–32)
CREATININE: 0.88 mg/dL (ref 0.44–1.00)
Calcium: 9.8 mg/dL (ref 8.9–10.3)
GFR calc Af Amer: >60 mL/min (ref >60)
GFR calc non Af Amer: >60 mL/min (ref >60)
Glucose, Bld: 106 mg/dL — ABNORMAL HIGH (ref 70–99)
Potassium: 3.7 mmol/L (ref 3.5–5.1)
SODIUM: 133 mmol/L — AB (ref 135–145)

## 2018-08-30 LAB — CBC
HEMATOCRIT: 24.7 % — AB (ref 36.0–46.0)
Hemoglobin: 7.8 g/dL — ABNORMAL LOW (ref 12.0–15.0)
MCH: 26.4 pg (ref 26.0–34.0)
MCHC: 31.6 g/dL (ref 30.0–36.0)
MCV: 83.4 fL (ref 80.0–100.0)
Platelets: 213 10*3/uL (ref 150–400)
RBC: 2.96 MIL/uL — ABNORMAL LOW (ref 3.87–5.11)
RDW: 17.6 % — AB (ref 11.5–15.5)
WBC: 13.9 10*3/uL — AB (ref 4.0–10.5)
nRBC: 0.1 % (ref 0.0–0.2)

## 2018-08-30 LAB — PROTIME-INR
INR: 1.15
Prothrombin Time: 14.6 seconds (ref 11.4–15.2)

## 2018-08-30 LAB — MRSA PCR SCREENING: MRSA by PCR: NEGATIVE

## 2018-08-30 MED ORDER — MIDAZOLAM HCL 2 MG/2ML IJ SOLN
INTRAMUSCULAR | Status: AC
  Filled 2018-08-30: qty 2

## 2018-08-30 MED ORDER — FENTANYL CITRATE (PF) 100 MCG/2ML IJ SOLN
INTRAMUSCULAR | Status: AC
  Filled 2018-08-30: qty 2

## 2018-08-30 MED ORDER — LIDOCAINE HCL (PF) 1 % IJ SOLN
INTRAMUSCULAR | Status: AC
  Filled 2018-08-30: qty 30

## 2018-08-30 MED ORDER — MIDAZOLAM HCL 2 MG/2ML IJ SOLN
INTRAMUSCULAR | Status: AC | PRN
  Administered 2018-08-30: 0.5 mg via INTRAVENOUS

## 2018-08-30 MED ORDER — INFLUENZA VAC SPLIT HIGH-DOSE 0.5 ML IM SUSY
0.5000 mL | PREFILLED_SYRINGE | INTRAMUSCULAR | Status: DC
  Filled 2018-08-30: qty 0.5

## 2018-08-30 MED ORDER — FENTANYL CITRATE (PF) 100 MCG/2ML IJ SOLN
INTRAMUSCULAR | Status: AC | PRN
  Administered 2018-08-30 (×2): 25 ug via INTRAVENOUS

## 2018-08-30 NOTE — Consult Note (Signed)
[place]  [phone]   HEMATOLOGY ONCOLOGY INPATIENT CONSULTATION   Patricia Murillo  DOB: 1946/04/30  MR#: [id number]  CSN#: [id number]    Requesting Physician: [facility] Dr. Erlinda Hong  Patient Care Team: Patient, No Pcp Per as PCP - General (General Practice)  Reason for consult: probable metastatic malignancy   History of present illness:   This is a 72 yo female with PMH of GI bleeding in 2009, HIDA hernia,, complete heart block, status post pacemaker placement on right chest wall in July 2019, was admitted for severe anemia, likely secondary to anticoagulation and GI bleeding.  Patient states she has had left upper extremity edema since the pacemaker placement, her cardiologist initially tried to put the pacemaker on the left chest wall, was unsuccessful.  She was seen by her cardiologist last week, due to the worsening left upper extremity edema, concerning for thrombosis, she was started on Eliquis 5 days ago.  Due to his diarrhea and black stool, she stopped after 3 doses.  He presented to the emergency room with severe fatigue, and was found to have hemoglobin 5.0.  She received blood transfusion, and underwent CT chest with contrast for severe left arm swelling, which showed bilateral lung nodules, small left pleural effusion, or mass in the left chest wall and significant left axillary adenopathy.  I was called for consult.  I spoke with interventional radiologist Dr. Anselm Pancoast this morning, and patient underwent left axillary lymph node biopsy this afternoon.  MEDICAL HISTORY:  Past Medical History:  Diagnosis Date  . Arthritis   . Hiatal hernia   . History of chicken pox   . Hyperlipidemia   . Pacemaker     SURGICAL HISTORY: Past Surgical History:  Procedure Laterality Date  . ANKLE SURGERY    . PACEMAKER IMPLANT N/A 04/18/2018   Procedure: PACEMAKER IMPLANT;  Surgeon: Evans Lance, MD;  Location: [facility];  Service:  Cardiovascular;  Laterality: N/A;    SOCIAL HISTORY: Social History   Socioeconomic History  . Marital status: Divorced    Spouse name: Not on file  . Number of children: 3  . Years of education: 47  . Highest education level: Not on file  Occupational History  . Occupation: Retired  Scientific laboratory technician  . Financial resource strain: Not on file  . Food insecurity:    Worry: Not on file    Inability: Not on file  . Transportation needs:    Medical: Not on file    Non-medical: Not on file  Tobacco Use  . Smoking status: Former Smoker    Last attempt to quit: 10/05/2001    Years since quitting: 16.9  . Smokeless tobacco: Never Used  Substance and Sexual Activity  . Alcohol use: No  . Drug use: No  . Sexual activity: Not Currently  Lifestyle  . Physical activity:    Days per week: Not on file    Minutes per session: Not on file  . Stress: Not on file  Relationships  . Social connections:    Talks on phone: Not on file    Gets together: Not on file    Attends religious service: Not on file    Active member of club or organization: Not on file    Attends meetings of clubs or organizations: Not on file    Relationship status: Not on file  . Intimate partner violence:    Fear of current or ex partner: Not on file  Emotionally abused: Not on file    Physically abused: Not on file    Forced sexual activity: Not on file  Other Topics Concern  . Not on file  Social History Narrative   Regular exercise-no   Caffeine Use-yes    FAMILY HISTORY: Family History  Problem Relation Age of Onset  . Hypertension Mother   . Arthritis Mother   . Arthritis Father   . Cancer Sister        Breast Cancer  . Heart disease Paternal Uncle     ALLERGIES:  has No Known Allergies.  MEDICATIONS:  Current Facility-Administered Medications  Medication Dose Route Frequency Provider Last Rate Last Dose  . fentaNYL (SUBLIMAZE) 100 MCG/2ML injection           . lidocaine (PF) (XYLOCAINE) 1 %  injection           . midazolam (VERSED) 2 MG/2ML injection           . 0.9 %  sodium chloride infusion   Intravenous Continuous Karmen Bongo, MD 125 mL/hr at 08/30/18 0945    . acetaminophen (TYLENOL) tablet 650 mg  650 mg Oral Q6H PRN Karmen Bongo, MD       Or  . acetaminophen (TYLENOL) suppository 650 mg  650 mg Rectal Q6H PRN Karmen Bongo, MD      . fentaNYL (SUBLIMAZE) injection   Intravenous PRN Markus Daft, MD   25 mcg at 08/30/18 1516  . iopamidol (ISOVUE-370) 76 % injection 100 mL  100 mL Intravenous Once PRN Karmen Bongo, MD      . midazolam (VERSED) injection   Intravenous PRN Markus Daft, MD   0.5 mg at 08/30/18 1514  . ondansetron (ZOFRAN) tablet 4 mg  4 mg Oral Q6H PRN Karmen Bongo, MD       Or  . ondansetron [facility]) injection 4 mg  4 mg Intravenous Q6H PRN Karmen Bongo, MD      . pantoprazole (PROTONIX) injection 40 mg  40 mg Intravenous Lillia Mountain, MD   40 mg at 08/30/18 0933  . sodium chloride flush (NS) 0.9 % injection 3 mL  3 mL Intravenous Q12H Karmen Bongo, MD   3 mL at 08/30/18 0934  . traMADol (ULTRAM) tablet 50 mg  50 mg Oral Q6H PRN Schorr, Rhetta Mura, NP   50 mg at 08/30/18 4098    REVIEW OF SYSTEMS:   Constitutional: Denies fevers, chills or abnormal night sweats, (+) fatigue  Eyes: Denies blurriness of vision, double vision or watery eyes Ears, nose, mouth, throat, and face: Denies mucositis or sore throat Respiratory: Denies cough, (+)dyspnea, no wheezes Cardiovascular: Denies palpitation, chest discomfort or lower extremity swelling Gastrointestinal:  Denies nausea, heartburn or change in bowel habits Skin: Denies abnormal skin rashes Lymphatics: Denies new lymphadenopathy or easy bruising Neurological:Denies numbness, tingling or new weaknesses Behavioral/Psych: Mood is stable, no new changes  All other systems were reviewed with the patient and are negative.  PHYSICAL EXAMINATION: ECOG PERFORMANCE STATUS: 3 - Symptomatic,  >50% confined to bed  Vitals:   08/30/18 1520 08/30/18 1525  BP: (!) 163/63 (!) 154/62  Pulse: 80 80  Resp: (!) 22 (!) 25  Temp:    SpO2: 98% 99%   Filed Weights   08/30/18 1034  Weight: 198 lb 13.7 oz (90.2 kg)    GENERAL:alert, no distress and comfortable SKIN: skin color, texture, turgor are normal, no rashes or significant lesions EYES: normal, conjunctiva are pink and non-injected, sclera clear OROPHARYNX:no  exudate, no erythema and lips, buccal mucosa, and tongue normal  NECK: supple, thyroid normal size, non-tender, without nodularity LYMPH:  no palpable lymphadenopathy in the cervical, axillary or inguinal LUNGS: clear to auscultation and percussion with normal breathing effort HEART: regular rate & rhythm and no murmurs and no lower extremity edema ABDOMEN:abdomen soft, non-tender and normal bowel sounds Musculoskeletal:no cyanosis of digits and no clubbing, (+) significant edema of the entire left upper extremity PSYCH: alert & oriented x 3 with fluent speech NEURO: no focal motor/sensory deficits BREAST: Bilateral breast exam showed no palpable mass, skin change or nipple inversion.  LABORATORY DATA:  I have reviewed the data as listed Lab Results  Component Value Date   WBC 13.9 (H) 08/30/2018   HGB 7.8 (L) 08/30/2018   HCT 24.7 (L) 08/30/2018   MCV 83.4 08/30/2018   PLT 213 08/30/2018   Recent Labs    04/18/18 0815 04/18/18 0838 08/29/18 0835 08/30/18 0226  NA 141 138 136 133*  K 3.9 3.9 4.3 3.7  CL 102 103 100 100  CO2 27  --  26 24  GLUCOSE 105* 105* 135* 106*  BUN 8 9 23 19   CREATININE 0.72 0.70 1.04* 0.88  CALCIUM 9.5  --  10.6* 9.8  GFRNONAA >60  --  53* >60  GFRAA >60  --  >60 >60  PROT 6.8  --  5.2*  --   ALBUMIN 3.3*  --  2.5*  --   AST 18  --  15  --   ALT 12  --  10  --   ALKPHOS 94  --  77  --   BILITOT 1.2  --  1.3*  --     RADIOGRAPHIC STUDIES: I have personally reviewed the radiological images as listed and agreed with the  findings in the report. Ct Angio Chest Pe W Or Wo Contrast  Result Date: 08/29/2018 CLINICAL DATA:  Severe left arm swelling since yesterday. Hemoglobin L5. EXAM: CT ANGIOGRAPHY CHEST WITH CONTRAST TECHNIQUE: Multidetector CT imaging of the chest was performed using the standard protocol during bolus administration of intravenous contrast. Multiplanar CT image reconstructions and MIPs were obtained to evaluate the vascular anatomy. CONTRAST:  100 cc ISOVUE-370 IOPAMIDOL (ISOVUE-370) INJECTION 76% COMPARISON:  CXR 08/29/2018 FINDINGS: Cardiovascular: Conventional branch pattern of the great vessels with minimal atherosclerosis at the origins. No significant stenosis. Patent left subclavian artery traversing an area of ill-defined soft tissue edema and enlargement as described below. Mild aortic atherosclerosis without aneurysm or dissection. Satisfactory opacification of the pulmonary arteries to the proximal segmental level without large central pulmonary embolus. Mediastinum/Nodes: Left axillary nodal enlargement is identified ranging size from 0.3 cm to 2.8 cm short axis. Mildly enlarged right-sided axillary lymph nodes up to 1.3 cm short axis are identified. Large hiatal hernia with air-fluid level noted within. 1.4 cm short axis mass abutting the left lateral aspect of the aortic arch in the prevascular space is noted more likely to represent a pulmonary lesion as opposed lymphadenopathy. Nonpathologic sized mediastinal and hilar lymph nodes are noted. Lungs/Pleura: Masslike opacities are identified in the lingula, the largest abutting the pleural surface measuring 3.3 x 1.7 cm on series 5/92 with previously mentioned paramediastinal mass in the left upper lobe measuring 2.3 x 1.4 cm, series 5/49. Superior segment left lower lobe mass measuring 1.8 x 1.3 cm, series 5/52 is also noted. There are smaller subcentimeter nodules within both upper lobes, right middle lobe and right lower lobe with largest masslike  opacities  in the right lower lobe measuring 1.1 x 1 cm, series 5/89 and abutting the right lateral margin of the hiatal hernia measuring 2.5 x 1.9 cm, series 5/105. Moderate left effusion with compressive atelectasis is identified with trace right pleural effusion. No pneumothorax. Upper Abdomen: No space-occupying mass of the included liver. The included right adrenal gland is unremarkable. Nodular soft tissue densities in the left upper quadrant are felt to represent partial imaging of small bowel less likely mesenteric adenopathy. The included spleen is top-normal in size. Musculoskeletal: Ill-defined asymmetric soft tissue enlargement in the region of the left pectoralis muscle suspicious for intramuscular hemorrhage and edema given reported rapid onset of left arm swelling and clinical report of low hemoglobin. Asymmetric subcutaneous edema of the partially included left breast with thickening of the skin concerning for possible possible inflammatory carcinoma of the breast. Right-sided pacemaker apparatus is noted with leads in the right atrium right ventricle. Thoracic spondylosis is noted. Review of the MIP images confirms the above findings. IMPRESSION: 1. No acute large central pulmonary embolus, aortic aneurysm or dissection. 2. Multilobar masslike opacities scattered throughout both lungs as above described, the largest in the lingula measuring up to 3.3 cm, presumed metastatic given left axillary adenopathy also noted. 3. Left axillary nodal enlargement measuring up to 2.8 cm short axis. 4. Asymmetric soft tissue enlargement of the left pectoralis muscle suspicious for intramuscular hemorrhage and edema given reported rapid onset of left arm swelling and clinical report of low hemoglobin. An infiltrating mass or metastasis is also a differential considerations given local adenopathy, asymmetric subcutaneous edema of the partially included left breast with thickening of the skin concerning for possible  inflammatory carcinoma of the breast. Clinical correlation is therefore recommended as well as mammographic correlation. 5. Moderate left and trace right pleural effusions with compressive atelectasis. These results were called by telephone at the time of interpretation on 08/29/2018 at 3:43 pm to Dr. Karmen Bongo , who verbally acknowledged these results. Aortic Atherosclerosis (ICD10-I70.0). Electronically Signed   By: Ashley Royalty M.D.   On: 08/29/2018 15:43   Dg Chest Portable 1 View  Result Date: 08/29/2018 CLINICAL DATA:  72 year old female with a history of shortness of breath and edema EXAM: PORTABLE CHEST 1 VIEW COMPARISON:  04/19/2018, 04/18/2018 FINDINGS: Cardiomediastinal silhouette unchanged in size and contour. Double density projects over the lower mediastinum is unchanged. On the frontal view there is a [place] at the left base obscuring the left hemidiaphragm with blunting of left costophrenic angle, increased from the prior. No pneumothorax. Unchanged right chest wall cardiac pacing device with 2 leads in place. No confluent airspace disease on the right. IMPRESSION: New opacity at the left lung base obscuring left hemidiaphragm in the left heart border, compatible with pleural effusion and associated atelectasis/consolidation. Hiatal hernia. Electronically Signed   By: Corrie Mckusick D.O.   On: 08/29/2018 09:09   Ue Venous Duplex ([facility] And [facility] Only)  Result Date: 08/29/2018 UPPER VENOUS STUDY  Indications: Pain (shoulder and elbow) Risk Factors: Failed pacemaker placement in left chest 04/2018. Limitations: Body habitus, swelling and poor ultrasound/tissue interface. Comparison Study: No prior study on file for comparison. Performing Technologist: Sharion Dove RVS  Examination Guidelines: A complete evaluation includes B-mode imaging, spectral Doppler, color Doppler, and power Doppler as needed of all accessible portions of each vessel. Bilateral testing is considered an integral part of a  complete examination. Limited examinations for reoccurring indications may be performed as noted.  Right Findings: +----------+------------+----------+---------+-----------+-------+ RIGHT  CompressiblePropertiesPhasicitySpontaneousSummary +----------+------------+----------+---------+-----------+-------+ Subclavian                         Yes       Yes            +----------+------------+----------+---------+-----------+-------+  Left Findings: +----------+------------+----------+---------+-----------+-------+ LEFT      CompressiblePropertiesPhasicitySpontaneousSummary +----------+------------+----------+---------+-----------+-------+ IJV                                Yes       Yes            +----------+------------+----------+---------+-----------+-------+ Subclavian                         Yes       Yes            +----------+------------+----------+---------+-----------+-------+ Axillary      Full                                          +----------+------------+----------+---------+-----------+-------+ Brachial      Full                                          +----------+------------+----------+---------+-----------+-------+ Cephalic      Full                                          +----------+------------+----------+---------+-----------+-------+ Basilic       Full                                          +----------+------------+----------+---------+-----------+-------+  Summary:  Right: No evidence of thrombosis in the subclavian.  Left: No evidence of deep vein thrombosis in the upper extremity. However, unable to visualize the radial and ulnar. No evidence of superficial vein thrombosis in the upper extremity. However, unable to visualize the radial and ulnar. No evidence of thrombosis in the . However, unable to visualize the radial and ulnar. This was a limited study.  *See table(s) above for measurements and observations.  Diagnosing  physician: Deitra Mayo MD Electronically signed by Deitra Mayo MD on 08/29/2018 at 4:20:56 PM.    Final     ASSESSMENT & PLAN: This is a 72 yo female with PMH of GI bleeding in 2009, HIDA hernia,, complete heart block, status post pacemaker placement on right chest wall in July 2019, was admitted for severe anemia, likely secondary to anticoagulation and GI bleeding.   1. Severe anemia, secondary to GI bleeding  2. Multiple lung mass, left chest wall mass vs hematoma, and axillary adenopathy, suspicious for metastatic malignancy, status post left axillary biopsy today 3. History of GI bleeding and hiatal hernia 4. Left arm edema, [place](-) for DVT, likely lymphedema from the left axillary adenopathy 5.  Complete heart block, status post pacemaker placement in July 2019 6. HTN    Recommendations: -I will f/u biopsy results tomorrow  -please obtain a CT ABD/PEL with contrast to complete staging and work up for primary cancer  -unless the ECHO  shows new evidence of thrombosis, I commend holding Eliquis due to her GI bleeding  -if CT abd/pel negative for malignancy, I will obtain mammogram and breast US as outpt workup to rule out primary breast cancer  -I spoke with her children also today.  -if she is clinically stable without further bleeding, OK to discharge later this week, I will see her back in my clinic next week.  -I will check iron study tomorrow, if significantly low, please consider one dose Feraheme before discharge. Thanks    All questions were answered. The patient knows to call the clinic with any problems, questions or concerns.  I spent a total of 60 minutes for her visit today, more than 50% of face-to-face counseling.      Truitt Merle, MD 08/30/2018 3:33 PM

## 2018-08-30 NOTE — Procedures (Signed)
US guided core biopsies of enlarged left axillary lymph node.  6 cores obtained and placed in saline.  Minimal blood loss and no immediate complication.

## 2018-08-30 NOTE — Care Management Note (Addendum)
Case Management Note  Patient Details  Name: Patricia Murillo MRN: [id number] Date of Birth: August 01, 1946  Subjective/Objective:   From home alone, presents with GIB since going on eliquis 5 days ago, anemia, chest ct showing probable metastaic disease in lungs, IR consulted for  left chest wall mass biopsy vs left axillary lymph node biopsy. Patient currently does not have a PCP, she would like NCM to check with [facility] on [place]. NCM called and did not get an answer.  Patient has follow up with [facility] on 12/13 at 11:30 with [place].                  Action/Plan: NCM will follow for transition of care needs.  Expected Discharge Date:                  Expected Discharge Plan:  [facility]  In-House Referral:     Discharge planning Services  CM Consult  Post Acute Care Choice:    Choice offered to:     DME Arranged:    DME Agency:     HH Arranged:    [place] Agency:     Status of Service:  In process, will continue to follow  If discussed at Long Length of Stay Meetings, dates discussed:    Additional Comments:  Zenon Mayo, RN 08/30/2018, 4:28 PM

## 2018-08-30 NOTE — Consult Note (Signed)
Chief Complaint: Patient was seen in consultation today for left chest wall mass biopsy vs left axillary lymph node biopsy Chief Complaint  Patient presents with  . Shortness of Breath  . Rectal Bleeding   at the request of Dr Burr Medico   Supervising Physician: Markus Daft  Patient Status: [facility] - In-pt  History of Present Illness: Patricia Murillo is a 72 y.o. female   Note of Dr Lovena Le office 08/23/18: Pacemaker implant 04/2018 Venogram performed prior to ppm implant revealed occluded L subclavian vein. PPM implanted on R side. I told Helene Kelp that I could make her mother an appt with the device clinic for Friday when Dr.Taylor is in the office. Was seen in office 11/22-- started on Eliquis per Dr Lovena Le  Reports left arm swelling Left breast swelling Over 1 month Was placed on Eliquis Friday night-- took Fri pm; Sat am and pm doses Developed tarry stools and DC meds then So LD Eliquis Sat pm (11/23 pm)  To ED 11/25: [place] yesterday: Right: No evidence of thrombosis in the subclavian. Left: No evidence of deep vein thrombosis in the upper extremity. However, unable to visualize the radial and ulnar. No evidence of superficial vein thrombosis in the upper extremity. However, unable to visualize the radial and ulnar. No evidence of thrombosis in the . However, unable to visualize the radial and ulnar. This was a limited study.  CT yesterday IMPRESSION: 1. No acute large central pulmonary embolus, aortic aneurysm or dissection. 2. Multilobar masslike opacities scattered throughout both lungs as above described, the largest in the lingula measuring up to 3.3 cm, presumed metastatic given left axillary adenopathy also noted. 3. Left axillary nodal enlargement measuring up to 2.8 cm short axis. 4. Asymmetric soft tissue enlargement of the left pectoralis muscle suspicious for intramuscular hemorrhage and edema given reported rapid onset of left arm swelling and clinical report  of low hemoglobin. An infiltrating mass or metastasis is also a differential considerations given local adenopathy, asymmetric subcutaneous edema of the partially included left breast with thickening of the skin concerning for possible inflammatory carcinoma of the breast. Clinical correlation is therefore recommended as well as mammographic correlation. 5. Moderate left   Request for biopsy per Dr Burr Medico Dr Anselm Pancoast has reviewed imaging and approves biopsy of Left chest wall mass vs left axillary lymph node   Past Medical History:  Diagnosis Date  . Arthritis   . Hiatal hernia   . History of chicken pox   . Hyperlipidemia   . Pacemaker     Past Surgical History:  Procedure Laterality Date  . ANKLE SURGERY    . PACEMAKER IMPLANT N/A 04/18/2018   Procedure: PACEMAKER IMPLANT;  Surgeon: Evans Lance, MD;  Location: [facility];  Service: Cardiovascular;  Laterality: N/A;    Allergies: Patient has no known allergies.  Medications: Prior to Admission medications   Medication Sig Start Date End Date Taking? Authorizing Provider  acetaminophen (TYLENOL) 650 MG CR tablet Take 1,300 mg by mouth every 8 (eight) hours as needed for pain.   Yes [provider]  azelastine (OPTIVAR) 0.05 % ophthalmic solution Place 1 drop into both eyes 2 (two) times daily. Patient taking differently: Place 1 drop into both eyes 2 (two) times daily as needed (vertigo).  06/25/17  Yes Golden Circle, FNP  meclizine (ANTIVERT) 12.5 MG tablet Take 1 tablet (12.5 mg total) by mouth 3 (three) times daily as needed for dizziness. 07/15/17  Yes Mesner, Corene Cornea, MD  Vitamin D, Cholecalciferol, 50 MCG (2000 UT) CAPS Take 2,000 Units by mouth daily.   Yes [provider]     Family History  Problem Relation Age of Onset  . Hypertension Mother   . Arthritis Mother   . Arthritis Father   . Cancer Sister        Breast Cancer  . Heart disease Paternal Uncle     Social History    Socioeconomic History  . Marital status: Divorced    Spouse name: Not on file  . Number of children: 3  . Years of education: 48  . Highest education level: Not on file  Occupational History  . Occupation: Retired  Scientific laboratory technician  . Financial resource strain: Not on file  . Food insecurity:    Worry: Not on file    Inability: Not on file  . Transportation needs:    Medical: Not on file    Non-medical: Not on file  Tobacco Use  . Smoking status: Former Smoker    Last attempt to quit: 10/05/2001    Years since quitting: 16.9  . Smokeless tobacco: Never Used  Substance and Sexual Activity  . Alcohol use: No  . Drug use: No  . Sexual activity: Not Currently  Lifestyle  . Physical activity:    Days per week: Not on file    Minutes per session: Not on file  . Stress: Not on file  Relationships  . Social connections:    Talks on phone: Not on file    Gets together: Not on file    Attends religious service: Not on file    Active member of club or organization: Not on file    Attends meetings of clubs or organizations: Not on file    Relationship status: Not on file  Other Topics Concern  . Not on file  Social History Narrative   Regular exercise-no   Caffeine Use-yes    Review of Systems: A 12 point ROS discussed and pertinent positives are indicated in the HPI above.  All other systems are negative.  Review of Systems  Constitutional: Positive for activity change and fatigue. Negative for fever.  Respiratory: Positive for shortness of breath.   Cardiovascular: Positive for chest pain.  Musculoskeletal: Positive for back pain.  Neurological: Positive for weakness.  Psychiatric/Behavioral: Negative for behavioral problems and confusion.    Vital Signs: BP (!) 160/64 (BP Location: Right Leg)   Pulse 75   Temp 97.9 F (36.6 C) (Oral)   Resp 18   SpO2 97%   Physical Exam  Constitutional: She is oriented to person, place, and time.  Cardiovascular: Normal rate and  regular rhythm.  Pulmonary/Chest: Effort normal.  Abdominal: Soft. Bowel sounds are normal.  Musculoskeletal: Normal range of motion.  Swollen left arm  Neurological: She is alert and oriented to person, place, and time.  Skin: Skin is warm and dry.  Psychiatric: She has a normal mood and affect. Her behavior is normal.  Vitals reviewed.   Imaging: Ct Angio Chest Pe W Or Wo Contrast  Result Date: 08/29/2018 CLINICAL DATA:  Severe left arm swelling since yesterday. Hemoglobin L5. EXAM: CT ANGIOGRAPHY CHEST WITH CONTRAST TECHNIQUE: Multidetector CT imaging of the chest was performed using the standard protocol during bolus administration of intravenous contrast. Multiplanar CT image reconstructions and MIPs were obtained to evaluate the vascular anatomy. CONTRAST:  100 cc ISOVUE-370 IOPAMIDOL (ISOVUE-370) INJECTION 76% COMPARISON:  CXR 08/29/2018 FINDINGS: Cardiovascular: Conventional branch pattern of the great vessels  with minimal atherosclerosis at the origins. No significant stenosis. Patent left subclavian artery traversing an area of ill-defined soft tissue edema and enlargement as described below. Mild aortic atherosclerosis without aneurysm or dissection. Satisfactory opacification of the pulmonary arteries to the proximal segmental level without large central pulmonary embolus. Mediastinum/Nodes: Left axillary nodal enlargement is identified ranging size from 0.3 cm to 2.8 cm short axis. Mildly enlarged right-sided axillary lymph nodes up to 1.3 cm short axis are identified. Large hiatal hernia with air-fluid level noted within. 1.4 cm short axis mass abutting the left lateral aspect of the aortic arch in the prevascular space is noted more likely to represent a pulmonary lesion as opposed lymphadenopathy. Nonpathologic sized mediastinal and hilar lymph nodes are noted. Lungs/Pleura: Masslike opacities are identified in the lingula, the largest abutting the pleural surface measuring 3.3 x 1.7  cm on series 5/92 with previously mentioned paramediastinal mass in the left upper lobe measuring 2.3 x 1.4 cm, series 5/49. Superior segment left lower lobe mass measuring 1.8 x 1.3 cm, series 5/52 is also noted. There are smaller subcentimeter nodules within both upper lobes, right middle lobe and right lower lobe with largest masslike opacities in the right lower lobe measuring 1.1 x 1 cm, series 5/89 and abutting the right lateral margin of the hiatal hernia measuring 2.5 x 1.9 cm, series 5/105. Moderate left effusion with compressive atelectasis is identified with trace right pleural effusion. No pneumothorax. Upper Abdomen: No space-occupying mass of the included liver. The included right adrenal gland is unremarkable. Nodular soft tissue densities in the left upper quadrant are felt to represent partial imaging of small bowel less likely mesenteric adenopathy. The included spleen is top-normal in size. Musculoskeletal: Ill-defined asymmetric soft tissue enlargement in the region of the left pectoralis muscle suspicious for intramuscular hemorrhage and edema given reported rapid onset of left arm swelling and clinical report of low hemoglobin. Asymmetric subcutaneous edema of the partially included left breast with thickening of the skin concerning for possible possible inflammatory carcinoma of the breast. Right-sided pacemaker apparatus is noted with leads in the right atrium right ventricle. Thoracic spondylosis is noted. Review of the MIP images confirms the above findings. IMPRESSION: 1. No acute large central pulmonary embolus, aortic aneurysm or dissection. 2. Multilobar masslike opacities scattered throughout both lungs as above described, the largest in the lingula measuring up to 3.3 cm, presumed metastatic given left axillary adenopathy also noted. 3. Left axillary nodal enlargement measuring up to 2.8 cm short axis. 4. Asymmetric soft tissue enlargement of the left pectoralis muscle suspicious for  intramuscular hemorrhage and edema given reported rapid onset of left arm swelling and clinical report of low hemoglobin. An infiltrating mass or metastasis is also a differential considerations given local adenopathy, asymmetric subcutaneous edema of the partially included left breast with thickening of the skin concerning for possible inflammatory carcinoma of the breast. Clinical correlation is therefore recommended as well as mammographic correlation. 5. Moderate left and trace right pleural effusions with compressive atelectasis. These results were called by telephone at the time of interpretation on 08/29/2018 at 3:43 pm to Dr. Karmen Bongo , who verbally acknowledged these results. Aortic Atherosclerosis (ICD10-I70.0). Electronically Signed   By: Ashley Royalty M.D.   On: 08/29/2018 15:43   Dg Chest Portable 1 View  Result Date: 08/29/2018 CLINICAL DATA:  72 year old female with a history of shortness of breath and edema EXAM: PORTABLE CHEST 1 VIEW COMPARISON:  04/19/2018, 04/18/2018 FINDINGS: Cardiomediastinal silhouette unchanged in size and contour.  Double density projects over the lower mediastinum is unchanged. On the frontal view there is a Passy at the left base obscuring the left hemidiaphragm with blunting of left costophrenic angle, increased from the prior. No pneumothorax. Unchanged right chest wall cardiac pacing device with 2 leads in place. No confluent airspace disease on the right. IMPRESSION: New opacity at the left lung base obscuring left hemidiaphragm in the left heart border, compatible with pleural effusion and associated atelectasis/consolidation. Hiatal hernia. Electronically Signed   By: Corrie Mckusick D.O.   On: 08/29/2018 09:09   Ue Venous Duplex ([facility] And [facility] Only)  Result Date: 08/29/2018 UPPER VENOUS STUDY  Indications: Pain (shoulder and elbow) Risk Factors: Failed pacemaker placement in left chest 04/2018. Limitations: Body habitus, swelling and poor ultrasound/tissue  interface. Comparison Study: No prior study on file for comparison. Performing Technologist: Sharion Dove RVS  Examination Guidelines: A complete evaluation includes B-mode imaging, spectral Doppler, color Doppler, and power Doppler as needed of all accessible portions of each vessel. Bilateral testing is considered an integral part of a complete examination. Limited examinations for reoccurring indications may be performed as noted.  Right Findings: +----------+------------+----------+---------+-----------+-------+ RIGHT     CompressiblePropertiesPhasicitySpontaneousSummary +----------+------------+----------+---------+-----------+-------+ Subclavian                         Yes       Yes            +----------+------------+----------+---------+-----------+-------+  Left Findings: +----------+------------+----------+---------+-----------+-------+ LEFT      CompressiblePropertiesPhasicitySpontaneousSummary +----------+------------+----------+---------+-----------+-------+ IJV                                Yes       Yes            +----------+------------+----------+---------+-----------+-------+ Subclavian                         Yes       Yes            +----------+------------+----------+---------+-----------+-------+ Axillary      Full                                          +----------+------------+----------+---------+-----------+-------+ Brachial      Full                                          +----------+------------+----------+---------+-----------+-------+ Cephalic      Full                                          +----------+------------+----------+---------+-----------+-------+ Basilic       Full                                          +----------+------------+----------+---------+-----------+-------+  Summary:  Right: No evidence of thrombosis in the subclavian.  Left: No evidence of deep vein thrombosis in the upper extremity.  However, unable to visualize the radial and ulnar. No evidence of superficial vein thrombosis in the upper extremity. However,  unable to visualize the radial and ulnar. No evidence of thrombosis in the . However, unable to visualize the radial and ulnar. This was a limited study.  *See table(s) above for measurements and observations.  Diagnosing physician: Deitra Mayo MD Electronically signed by Deitra Mayo MD on 08/29/2018 at 4:20:56 PM.    Final     Labs:  CBC: Recent Labs    04/18/18 0815 04/18/18 2683 08/29/18 0835 08/30/18 0226  WBC 6.4  --  14.4* 13.9*  HGB 10.8* 11.6* 5.0* 7.8*  HCT 36.9 34.0* 18.2* 24.7*  PLT 336  --  313 213    COAGS: No results for input(s): INR, APTT in the last 8760 hours.  BMP: Recent Labs    04/18/18 0815 04/18/18 0838 08/29/18 0835 08/30/18 0226  NA 141 138 136 133*  K 3.9 3.9 4.3 3.7  CL 102 103 100 100  CO2 27  --  26 24  GLUCOSE 105* 105* 135* 106*  BUN 8 9 23 19   CALCIUM 9.5  --  10.6* 9.8  CREATININE 0.72 0.70 1.04* 0.88  GFRNONAA >60  --  53* >60  GFRAA >60  --  >60 >60    LIVER FUNCTION TESTS: Recent Labs    04/18/18 0815 08/29/18 0835  BILITOT 1.2 1.3*  AST 18 15  ALT 12 10  ALKPHOS 94 77  PROT 6.8 5.2*  ALBUMIN 3.3* 2.5*    TUMOR MARKERS: No results for input(s): AFPTM, CEA, CA199, CHROMGRNA in the last 8760 hours.  Assessment and Plan:  Rt pacemaker placed 04/2018 Left arm swelling since then Known Left subclavian vein occlusion Was started on Eliquis 11/22-- took 3 doses and developed tarry stools LD 11/23 pm New work up reveals pulmonary nodules; left chest wall mass and left axillary LAN Now scheduled for LAN vs left chest wall mass biopsy Risks and benefits discussed with the patient including, but not limited to bleeding, infection, damage to adjacent structures or low yield requiring additional tests.  All of the patient's questions were answered, patient is agreeable to  proceed. Consent signed and in chart.  Thank you for this interesting consult.  I greatly enjoyed meeting HARMONII KARLE and look forward to participating in their care.  A copy of this report was sent to the requesting provider on this date.  Electronically Signed: Lavonia Drafts, PA-C 08/30/2018, 9:26 AM   I spent a total of 40 Minutes    in face to face in clinical consultation, greater than 50% of which was counseling/coordinating care for left chest wall mass bx vs left axillary LAN bx

## 2018-08-30 NOTE — Progress Notes (Signed)
PROGRESS NOTE  Patricia Murillo ZOX:[id number] DOB: September 20, 1946 DOA: 08/29/2018 PCP: Patient, No Pcp Per  HPI/Recap of past 24 hours:  Feeling better after prbc transfusion  Assessment/Plan: Principal Problem:   GI bleeding Active Problems:   Hyperlipidemia   Complete heart block (HCC)   Symptomatic anemia   Lymphedema of left arm  GI bleeding with symptomatic anemia -she presented to the hospital due to "Reports not feeling well the last couple of weeks with difficulty doing ADLs, dark stools after started on eliquis -hgb on presentation is 5, FOBT + -s/p prbc transfusionx3, keep hgb >8 -[facility] Dr. Cristina Gong consulted per Dr Cristina Gong, patient likely has intermittent GI bleed  from her  large hiatal hernia exacerbated by Eliquis "there is no urgent need for endoscopic or colonoscopic evaluation.  If she shows no evidence of further blood loss now that her Eliquis is stopped, it would not be mandatory to pursue GI work-up for her bleeding." -continue PPI -repeat cbc in am  hgb improving, tele with paced rhythm, will d/c tele  Metastatic cancer with multiple lung mets -s/p US guided core biopsies of enlarged left axillary lymph node.  6 cores obtained and placed in saline -oncology Dr Burr Medico input appreciated  Left arm edema -per EP note , patient has left subclavian vein occlusion, she is started on eliquis for this -Per patient she stopped taking Eliquis on saturday due to tarry stool -echocardiogram to r/o right side cardiac thrombus ( recommended by IR Dr Anselm Pancoast)  Third degree heart block status post pacemaker placement in July 2019 Per EP "they were not able to pass the catheter on the left " Patient subsequently developed swelling of the left chest, breast, and arm c/w lymphedema  Code Status: DNR  Family Communication: patient and family  Disposition Plan: likely home on 11/27 if hgb stable, echocardiogram  ok   Consultants:  IR  Oncology  GI  Procedures: left chest wall mass biopsy vs left axillary lymph node biopsy planned on 11/26 prbc transfusion  Antibiotics:  none   Objective: BP (!) 160/64 (BP Location: Right Leg)   Pulse 75   Temp 97.9 F (36.6 C) (Oral)   Resp 18   SpO2 97%   Intake/Output Summary (Last 24 hours) at 08/30/2018 0951 Last data filed at 08/30/2018 0945 Gross per 24 hour  Intake 3528.42 ml  Output 560 ml  Net 2968.42 ml   There were no vitals filed for this visit.  Exam: Patient is examined daily including today on 08/30/2018, exams remain the same as of yesterday except that has changed    General:  Pale, NAD  Cardiovascular: paced rhythm   Respiratory: CTABL  Abdomen: Soft/ND/NT, positive BS  Musculoskeletal: left arm Edema  Neuro: alert, oriented   Data Reviewed: Basic Metabolic Panel: Recent Labs  Lab 08/29/18 0835 08/30/18 0226  NA 136 133*  K 4.3 3.7  CL 100 100  CO2 26 24  GLUCOSE 135* 106*  BUN 23 19  CREATININE 1.04* 0.88  CALCIUM 10.6* 9.8   Liver Function Tests: Recent Labs  Lab 08/29/18 0835  AST 15  ALT 10  ALKPHOS 77  BILITOT 1.3*  PROT 5.2*  ALBUMIN 2.5*   No results for input(s): LIPASE, AMYLASE in the last 168 hours. No results for input(s): AMMONIA in the last 168 hours. CBC: Recent Labs  Lab 08/29/18 0835 08/30/18 0226  WBC 14.4* 13.9*  HGB 5.0* 7.8*  HCT 18.2* 24.7*  MCV 87.1 83.4  PLT 313  213   Cardiac Enzymes:   Recent Labs  Lab 08/29/18 0835  TROPONINI 0.04*   BNP (last 3 results) No results for input(s): BNP in the last 8760 hours.  ProBNP (last 3 results) No results for input(s): PROBNP in the last 8760 hours.  CBG: No results for input(s): GLUCAP in the last 168 hours.  Recent Results (from the past 240 hour(s))  MRSA PCR Screening     Status: None   Collection Time: 08/30/18  1:06 AM  Result Value Ref Range Status   MRSA by PCR NEGATIVE NEGATIVE Final     Comment:        The GeneXpert MRSA Assay (FDA approved for NASAL specimens only), is one component of a comprehensive MRSA colonization surveillance program. It is not intended to diagnose MRSA infection nor to guide or monitor treatment for MRSA infections. Performed at [facility], [place] [address], [place], [place] [address]      Studies: Ct Angio Chest Pe W Or Wo Contrast  Result Date: 08/29/2018 CLINICAL DATA:  Severe left arm swelling since yesterday. Hemoglobin L5. EXAM: CT ANGIOGRAPHY CHEST WITH CONTRAST TECHNIQUE: Multidetector CT imaging of the chest was performed using the standard protocol during bolus administration of intravenous contrast. Multiplanar CT image reconstructions and MIPs were obtained to evaluate the vascular anatomy. CONTRAST:  100 cc ISOVUE-370 IOPAMIDOL (ISOVUE-370) INJECTION 76% COMPARISON:  CXR 08/29/2018 FINDINGS: Cardiovascular: Conventional branch pattern of the great vessels with minimal atherosclerosis at the origins. No significant stenosis. Patent left subclavian artery traversing an area of ill-defined soft tissue edema and enlargement as described below. Mild aortic atherosclerosis without aneurysm or dissection. Satisfactory opacification of the pulmonary arteries to the proximal segmental level without large central pulmonary embolus. Mediastinum/Nodes: Left axillary nodal enlargement is identified ranging size from 0.3 cm to 2.8 cm short axis. Mildly enlarged right-sided axillary lymph nodes up to 1.3 cm short axis are identified. Large hiatal hernia with air-fluid level noted within. 1.4 cm short axis mass abutting the left lateral aspect of the aortic arch in the prevascular space is noted more likely to represent a pulmonary lesion as opposed lymphadenopathy. Nonpathologic sized mediastinal and hilar lymph nodes are noted. Lungs/Pleura: Masslike opacities are identified in the lingula, the largest abutting the pleural surface measuring 3.3 x 1.7  cm on series 5/92 with previously mentioned paramediastinal mass in the left upper lobe measuring 2.3 x 1.4 cm, series 5/49. Superior segment left lower lobe mass measuring 1.8 x 1.3 cm, series 5/52 is also noted. There are smaller subcentimeter nodules within both upper lobes, right middle lobe and right lower lobe with largest masslike opacities in the right lower lobe measuring 1.1 x 1 cm, series 5/89 and abutting the right lateral margin of the hiatal hernia measuring 2.5 x 1.9 cm, series 5/105. Moderate left effusion with compressive atelectasis is identified with trace right pleural effusion. No pneumothorax. Upper Abdomen: No space-occupying mass of the included liver. The included right adrenal gland is unremarkable. Nodular soft tissue densities in the left upper quadrant are felt to represent partial imaging of small bowel less likely mesenteric adenopathy. The included spleen is top-normal in size. Musculoskeletal: Ill-defined asymmetric soft tissue enlargement in the region of the left pectoralis muscle suspicious for intramuscular hemorrhage and edema given reported rapid onset of left arm swelling and clinical report of low hemoglobin. Asymmetric subcutaneous edema of the partially included left breast with thickening of the skin concerning for possible possible inflammatory carcinoma of the breast. Right-sided  pacemaker apparatus is noted with leads in the right atrium right ventricle. Thoracic spondylosis is noted. Review of the MIP images confirms the above findings. IMPRESSION: 1. No acute large central pulmonary embolus, aortic aneurysm or dissection. 2. Multilobar masslike opacities scattered throughout both lungs as above described, the largest in the lingula measuring up to 3.3 cm, presumed metastatic given left axillary adenopathy also noted. 3. Left axillary nodal enlargement measuring up to 2.8 cm short axis. 4. Asymmetric soft tissue enlargement of the left pectoralis muscle suspicious for  intramuscular hemorrhage and edema given reported rapid onset of left arm swelling and clinical report of low hemoglobin. An infiltrating mass or metastasis is also a differential considerations given local adenopathy, asymmetric subcutaneous edema of the partially included left breast with thickening of the skin concerning for possible inflammatory carcinoma of the breast. Clinical correlation is therefore recommended as well as mammographic correlation. 5. Moderate left and trace right pleural effusions with compressive atelectasis. These results were called by telephone at the time of interpretation on 08/29/2018 at 3:43 pm to Dr. Karmen Bongo , who verbally acknowledged these results. Aortic Atherosclerosis (ICD10-I70.0). Electronically Signed   By: Ashley Royalty M.D.   On: 08/29/2018 15:43   Ue Venous Duplex ([facility] And [facility] Only)  Result Date: 08/29/2018 UPPER VENOUS STUDY  Indications: Pain (shoulder and elbow) Risk Factors: Failed pacemaker placement in left chest 04/2018. Limitations: Body habitus, swelling and poor ultrasound/tissue interface. Comparison Study: No prior study on file for comparison. Performing Technologist: Sharion Dove RVS  Examination Guidelines: A complete evaluation includes B-mode imaging, spectral Doppler, color Doppler, and power Doppler as needed of all accessible portions of each vessel. Bilateral testing is considered an integral part of a complete examination. Limited examinations for reoccurring indications may be performed as noted.  Right Findings: +----------+------------+----------+---------+-----------+-------+ RIGHT     CompressiblePropertiesPhasicitySpontaneousSummary +----------+------------+----------+---------+-----------+-------+ Subclavian                         Yes       Yes            +----------+------------+----------+---------+-----------+-------+  Left Findings: +----------+------------+----------+---------+-----------+-------+ LEFT       CompressiblePropertiesPhasicitySpontaneousSummary +----------+------------+----------+---------+-----------+-------+ IJV                                Yes       Yes            +----------+------------+----------+---------+-----------+-------+ Subclavian                         Yes       Yes            +----------+------------+----------+---------+-----------+-------+ Axillary      Full                                          +----------+------------+----------+---------+-----------+-------+ Brachial      Full                                          +----------+------------+----------+---------+-----------+-------+ Cephalic      Full                                          +----------+------------+----------+---------+-----------+-------+  Basilic       Full                                          +----------+------------+----------+---------+-----------+-------+  Summary:  Right: No evidence of thrombosis in the subclavian.  Left: No evidence of deep vein thrombosis in the upper extremity. However, unable to visualize the radial and ulnar. No evidence of superficial vein thrombosis in the upper extremity. However, unable to visualize the radial and ulnar. No evidence of thrombosis in the . However, unable to visualize the radial and ulnar. This was a limited study.  *See table(s) above for measurements and observations.  Diagnosing physician: Deitra Mayo MD Electronically signed by Deitra Mayo MD on 08/29/2018 at 4:20:56 PM.    Final     Scheduled Meds: . pantoprazole  40 mg Intravenous Q12H  . sodium chloride flush  3 mL Intravenous Q12H    Continuous Infusions: . sodium chloride 125 mL/hr at 08/30/18 0945     Time spent: 77mins, case discussed with oncology Dr Burr Medico and IR PA ms Jannifer Franklin I have personally reviewed and interpreted on  08/30/2018 daily labs, tele strips, imagings as discussed above under date review session and  assessment and plans.  I reviewed all nursing notes, pharmacy notes, consultant notes,  vitals, pertinent old records  I have discussed plan of care as described above with RN , patient and family on 08/30/2018   Florencia Reasons MD, PhD  [facility] Pager [phone]. If 7PM-7AM, please contact night-coverage at [email or web address], password [facility] 08/30/2018, 9:51 AM  LOS: 1 day

## 2018-08-30 NOTE — Progress Notes (Signed)
  Echocardiogram 2D Echocardiogram has been attempted. Patient not in room.  Patricia Murillo 08/30/2018, 1:58 PM

## 2018-08-30 NOTE — Progress Notes (Signed)
Appropriate rise in hgb from 5 to 7.8 following tx 3 u prc's No bm's    Pt currently off anticoagulation.  Dr. Ernestina Penna note reviewed--it does not appear that GI w/u for malig will be needed.  Will sign off--please call us back if further input from [place] is needed.  Cleotis Nipper, M.D. Pager [phone] If no answer or after 5 PM call [phone]

## 2018-08-30 NOTE — Progress Notes (Signed)
Initial Nutrition Assessment  DOCUMENTATION CODES:   Obesity unspecified  INTERVENTION:   -RD will follow for diet advancement and supplement diet as appropriate  NUTRITION DIAGNOSIS:   Inadequate oral intake related to altered GI function as evidenced by NPO status.  GOAL:   Patient will meet greater than or equal to 90% of their needs  MONITOR:   Diet advancement, Labs, Weight trends, Skin, I & O's  REASON FOR ASSESSMENT:   Malnutrition Screening Tool    ASSESSMENT:   Patricia Murillo is a 72 y.o. female with medical history significant of HLD and complete heart block s/p pacemaker placement in 7/19 presenting with fatigue.  She has had arm pain and been nauseated with heaves which has resolved.  EP team tried to place a pacemaker on the left on 7/15 without success; since then her arm has had increasing swelling.  They were concerned about a blood clot on Friday and started her on Eliquis.  She took her first dose Friday night and she developed dark stools on Saturday.  She stopped the Eliquis Sunday.  She does not report known h/o afib and has not bene on [facility] before.  She has been weak and SOB with exertion.  No prior h/o transfusion.  She has had colonoscopy x 2, most recent in 2008.  Pt admitted with GIB with symptomatic anemia.   Per MD notes, chest CT revealed multiple lesions in lungs suspicious for metastasis. Pt is scheduled in IR for LAN vs lt chest wall biopsy.   Pt sleeping at time of visit. Most of history obtain from pt daughter at bedside, who reports that pt has been consuming small, frequent meals over the past 10 years secondary to hiatal hernia. Pt daughter noticed pt experiencing a general decline in health, including decreased appetite and weakness, over the past 2-3 weeks. Pt daughter unsure of UBW, however, estimates pt has lost 20# within the past 3 weeks, which while significant for time frame, is inconsistent with documented weight history. Pt also with  moderate edema, which may be masking true weight loss as well as fat and muscle depletion.   Unable to identify malnutrition at this time, however, suspect pt is at risk for malnutrition given possible malignancy and prolonged poor oral intake.   Labs reviewed: Na: 133.   NUTRITION - FOCUSED PHYSICAL EXAM:    Most Recent Value  Orbital Region  Mild depletion  Upper Arm Region  No depletion  Thoracic and Lumbar Region  No depletion  Buccal Region  No depletion  Temple Region  Mild depletion  Clavicle Bone Region  No depletion  Clavicle and Acromion Bone Region  No depletion  Scapular Bone Region  No depletion  Dorsal Hand  No depletion  Patellar Region  No depletion  Anterior Thigh Region  No depletion  Posterior Calf Region  No depletion  Edema (RD Assessment)  Moderate  Hair  Reviewed  Eyes  Reviewed  Mouth  Reviewed  Skin  Reviewed  Nails  Reviewed       Diet Order:   Diet Order            Diet NPO time specified  Diet effective now              EDUCATION NEEDS:   Education needs have been addressed  Skin:  Skin Assessment: Reviewed RN Assessment  Last BM:  08/28/18  Height:   Ht Readings from Last 1 Encounters:  08/30/18 5\' 6"  (1.676 m)  Weight:   Wt Readings from Last 1 Encounters:  08/30/18 90.2 kg    Ideal Body Weight:  59.1 kg  BMI:  Body mass index is 32.1 kg/m.  Estimated Nutritional Needs:   Kcal:  1155-2080  Protein:  90-105 grams  Fluid:  >1.7 L    Raneshia Derick A. Jimmye Norman, RD, LDN, CDE Pager: [phone] After hours Pager: [phone]

## 2018-08-31 ENCOUNTER — Inpatient Hospital Stay (HOSPITAL_COMMUNITY): Payer: Medicare HMO

## 2018-08-31 ENCOUNTER — Other Ambulatory Visit (HOSPITAL_COMMUNITY): Payer: Medicare HMO

## 2018-08-31 DIAGNOSIS — I361 Nonrheumatic tricuspid (valve) insufficiency: Secondary | ICD-10-CM

## 2018-08-31 DIAGNOSIS — E785 Hyperlipidemia, unspecified: Secondary | ICD-10-CM

## 2018-08-31 DIAGNOSIS — C801 Malignant (primary) neoplasm, unspecified: Secondary | ICD-10-CM

## 2018-08-31 LAB — ECHOCARDIOGRAM LIMITED
HEIGHTINCHES: 66 in
Weight: 3181.68 oz

## 2018-08-31 LAB — CBC WITH DIFFERENTIAL/PLATELET
BAND NEUTROPHILS: 0 %
BASOS ABS: 0 10*3/uL (ref 0.0–0.1)
BLASTS: 0 %
Basophils Relative: 0 %
EOS ABS: 0 10*3/uL (ref 0.0–0.5)
Eosinophils Relative: 0 %
HEMATOCRIT: 25.3 % — AB (ref 36.0–46.0)
HEMOGLOBIN: 7.6 g/dL — AB (ref 12.0–15.0)
LYMPHS PCT: 4 %
Lymphs Abs: 0.4 10*3/uL — ABNORMAL LOW (ref 0.7–4.0)
MCH: 25.9 pg — ABNORMAL LOW (ref 26.0–34.0)
MCHC: 30 g/dL (ref 30.0–36.0)
MCV: 86.1 fL (ref 80.0–100.0)
METAMYELOCYTES PCT: 0 %
Monocytes Absolute: 1.6 10*3/uL — ABNORMAL HIGH (ref 0.1–1.0)
Monocytes Relative: 16 %
Myelocytes: 0 %
NRBC: 0.2 % (ref 0.0–0.2)
Neutro Abs: 8.3 10*3/uL — ABNORMAL HIGH (ref 1.7–7.7)
Neutrophils Relative %: 80 %
Other: 0 %
PROMYELOCYTES RELATIVE: 0 %
Platelets: 226 10*3/uL (ref 150–400)
RBC: 2.94 MIL/uL — AB (ref 3.87–5.11)
RDW: 18.4 % — ABNORMAL HIGH (ref 11.5–15.5)
WBC: 10.3 10*3/uL (ref 4.0–10.5)
nRBC: 0 /100 WBC

## 2018-08-31 LAB — BASIC METABOLIC PANEL
Anion gap: 7 (ref 5–15)
BUN: 14 mg/dL (ref 8–23)
CHLORIDE: 102 mmol/L (ref 98–111)
CO2: 25 mmol/L (ref 22–32)
Calcium: 10.3 mg/dL (ref 8.9–10.3)
Creatinine, Ser: 0.87 mg/dL (ref 0.44–1.00)
GFR calc non Af Amer: >60 mL/min (ref >60)
Glucose, Bld: 104 mg/dL — ABNORMAL HIGH (ref 70–99)
POTASSIUM: 3.7 mmol/L (ref 3.5–5.1)
SODIUM: 134 mmol/L — AB (ref 135–145)

## 2018-08-31 LAB — RETICULOCYTES
Immature Retic Fract: 10.7 % (ref 2.3–15.9)
RBC.: 2.94 MIL/uL — ABNORMAL LOW (ref 3.87–5.11)
RETIC COUNT ABSOLUTE: 154.1 10*3/uL (ref 19.0–186.0)
RETIC CT PCT: 5.2 % — AB (ref 0.4–3.1)

## 2018-08-31 LAB — IRON AND TIBC
Iron: 27 ug/dL — ABNORMAL LOW (ref 28–170)
SATURATION RATIOS: 8 % — AB (ref 10.4–31.8)
TIBC: 318 ug/dL (ref 250–450)
UIBC: 291 ug/dL

## 2018-08-31 LAB — MAGNESIUM: MAGNESIUM: 1.7 mg/dL (ref 1.7–2.4)

## 2018-08-31 LAB — TSH: TSH: 1.563 u[IU]/mL (ref 0.350–4.500)

## 2018-08-31 LAB — PREPARE RBC (CROSSMATCH)

## 2018-08-31 LAB — FERRITIN: FERRITIN: 616 ng/mL — AB (ref 11–307)

## 2018-08-31 MED ORDER — FUROSEMIDE 10 MG/ML IJ SOLN
20.0000 mg | Freq: Once | INTRAMUSCULAR | Status: AC
  Administered 2018-08-31: 20 mg via INTRAVENOUS
  Filled 2018-08-31: qty 2

## 2018-08-31 MED ORDER — SODIUM CHLORIDE 0.9% IV SOLUTION
Freq: Once | INTRAVENOUS | Status: AC
  Administered 2018-08-31: 14:00:00 via INTRAVENOUS

## 2018-08-31 MED ORDER — IOHEXOL 300 MG/ML  SOLN
100.0000 mL | Freq: Once | INTRAMUSCULAR | Status: AC | PRN
  Administered 2018-08-31: 100 mL via INTRAVENOUS

## 2018-08-31 MED ORDER — ADULT MULTIVITAMIN W/MINERALS CH
1.0000 | ORAL_TABLET | Freq: Every day | ORAL | Status: DC
  Administered 2018-08-31 – 2018-09-01 (×2): 1 via ORAL
  Filled 2018-08-31 (×2): qty 1

## 2018-08-31 MED ORDER — ENSURE ENLIVE PO LIQD
237.0000 mL | Freq: Two times a day (BID) | ORAL | Status: DC
  Administered 2018-08-31 – 2018-09-01 (×2): 237 mL via ORAL

## 2018-08-31 NOTE — Progress Notes (Signed)
  Echocardiogram 2D Echocardiogram limited has been performed.  Darlina Sicilian M 08/31/2018, 4:35 PM

## 2018-08-31 NOTE — Progress Notes (Signed)
Nutrition Follow-up  DOCUMENTATION CODES:   Obesity unspecified  INTERVENTION:   -MVI with minerals daily -Ensure Enlive po BID, each supplement provides 350 kcal and 20 grams of protein  NUTRITION DIAGNOSIS:   Increased nutrient needs related to chronic illness(metastatic disease) as evidenced by estimated needs.  Progressing  GOAL:   Patient will meet greater than or equal to 90% of their needs  Progressing  MONITOR:   PO intake, Supplement acceptance, Labs, Weight trends, Skin, I & O's  REASON FOR ASSESSMENT:   Malnutrition Screening Tool    ASSESSMENT:   Patricia Murillo is a 72 y.o. female with medical history significant of HLD and complete heart block s/p pacemaker placement in 7/19 presenting with fatigue.  She has had arm pain and been nauseated with heaves which has resolved.  EP team tried to place a pacemaker on the left on 7/15 without success; since then her arm has had increasing swelling.  They were concerned about a blood clot on Friday and started her on Eliquis.  She took her first dose Friday night and she developed dark stools on Saturday.  She stopped the Eliquis Sunday.  She does not report known h/o afib and has not bene on [facility] before.  She has been weak and SOB with exertion.  No prior h/o transfusion.  She has had colonoscopy x 2, most recent in 2008.  11/26- s/p lymph node biopsy  Chart reviewed. 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  CT scan of the abdomen and pelvis with extensive lymphadenopathy but no clear primary tumor present".   Pt advanced to a regular diet; consuming 100%. Given history obtained from RD visit (consuming small, frequent meals due to hiatal hernia) will add nutritional supplements.   Labs reviewed: Na: 134.   Diet Order:   Diet Order            Diet regular Room service appropriate? Yes; Fluid consistency: Thin  Diet effective now              EDUCATION NEEDS:    Education needs have been addressed  Skin:  Skin Assessment: Skin Integrity Issues: Skin Integrity Issues:: Incisions Incisions: lt axilla  Last BM:  08/31/18  Height:   Ht Readings from Last 1 Encounters:  08/30/18 5\' 6"  (1.676 m)    Weight:   Wt Readings from Last 1 Encounters:  08/30/18 90.2 kg    Ideal Body Weight:  59.1 kg  BMI:  Body mass index is 32.1 kg/m.  Estimated Nutritional Needs:   Kcal:  0093-8182  Protein:  90-105 grams  Fluid:  >1.7 L    Adalae Baysinger A. Jimmye Norman, RD, LDN, CDE Pager: [phone] After hours Pager: [phone]

## 2018-08-31 NOTE — Progress Notes (Signed)
PROGRESS NOTE  Patricia Murillo WLS:[id number] DOB: 1946/06/06 DOA: 08/29/2018 PCP: Patient, No Pcp Per   LOS: 2 days   Brief Narrative / Interim history: 72 year old female with history of hyperlipidemia, complete heart block status post pacemaker placement couple of months ago who was admitted to the hospital with fatigue.  She has been having progressive left arm swelling following the pacemaker placement, and on few days ago cardiology was concerned about DVT and started her on Eliquis.  She developed dark stools the next day.  When she was admitted her hemoglobin was found to be 5.  CT Angio on admission with concern for metastatic malignancy.  Subjective: -She feels weak this morning, denies any chest pain, denies any shortness of breath.  No abdominal pain, nausea or vomiting.  Has not had a bowel movement yet  Assessment & Plan: Principal Problem:   GI bleeding Active Problems:   Hyperlipidemia   Complete heart block (HCC)   Symptomatic anemia   Lymphedema of left arm   Left upper extremity swelling   Metastatic cancer (HCC)   Hiatal hernia   GI bleeding with symptomatic anemia -she presented to the hospital due to "Reports not feeling well the last couple of weeks with difficulty doing ADLs, dark stools after started on eliquis -hgb on presentation is 5, FOBT + -s/p prbc transfusion x 4, 3 units on presentation 1 additional unit today 11/27 -[facility] Dr. Cristina Gong consulted per Dr Cristina Gong, patient likely has intermittent GI bleed  from her  large hiatal hernia exacerbated by Eliquis "there is no urgent need for endoscopic or colonoscopic evaluation.If she shows no evidence of further blood loss nowthat her Eliquis is stopped, it would not be mandatory to pursue GI work-up for her bleeding." -continue PPI -Repeat CBC in a.m. after the fourth unit has been transfused  Metastatic cancer with multiple lung mets -s/p US guided core biopsies of enlarged left axillary lymph  node. 6 cores obtained and placed in saline -Oncology Dr. Annamaria Boots consulted, I discussed with her over the phone today, preliminary pathology does indeed shows poorly differentiated malignancy.  CT scan of the abdomen and pelvis with extensive lymphadenopathy but no clear primary tumor present  Left arm edema -per EP note, patient has left subclavian vein occlusion, she is started on eliquis for this -Per patient she stopped taking Eliquis on saturday due to tarry stool -echocardiogram to r/o right side cardiac thrombus ( recommended by IR Dr Anselm Pancoast) still pending  Third degree heart block status post pacemaker placement in July 2019 -Per EP "they were not able to pass the catheter on the left " -Patient subsequently developed swelling of the left chest, breast, and arm c/w lymphedema  Scheduled Meds: . Influenza vac split quadrivalent PF  0.5 mL Intramuscular Tomorrow-1000  . pantoprazole  40 mg Intravenous Q12H  . sodium chloride flush  3 mL Intravenous Q12H   Continuous Infusions: PRN Meds:.acetaminophen **OR** acetaminophen, ondansetron **OR** ondansetron (ZOFRAN) IV, traMADol  DVT prophylaxis: SCDs Code Status: DNR Family Communication: Daughter present at bedside Disposition Plan: Home when ready  Consultants:   Oncology  IR  Gastroenterology  Procedures:   2D echo: pending   Antimicrobials:  None    Objective: Vitals:   08/30/18 2338 08/31/18 0743 08/31/18 1323 08/31/18 1406  BP: (!) 167/67 (!) 149/77 (!) 163/81 (!) 135/113  Pulse: 86 83 86 90  Resp:  12 16 18   Temp:  (!) 97.4 F (36.3 C) 97.9 F (36.6 C)   TempSrc:  Oral Oral   SpO2: (!) 88% 97% 99% 100%  Weight:      Height:        Intake/Output Summary (Last 24 hours) at 08/31/2018 1523 Last data filed at 08/31/2018 1342 Gross per 24 hour  Intake 1178.87 ml  Output -  Net 1178.87 ml   Filed Weights   08/30/18 1034  Weight: 90.2 kg    Examination:  Constitutional: NAD Eyes:  lids and  conjunctivae normal ENMT: Mucous membranes are moist. Neck: normal, supple Respiratory: Diminished breath sounds at the bases, no wheezing or crackles heard.  Shallow respiratory effort Cardiovascular: Regular rate and rhythm, no murmurs / rubs / gallops. No LE edema. Abdomen: no tenderness Musculoskeletal: no clubbing / cyanosis.  Left upper extremity swollen Skin: no rashes Neurologic: CN 2-12 grossly intact. Strength 5/5 in all 4.  Psychiatric: Normal judgment and insight. Alert and oriented x 3. Normal mood.    Data Reviewed: I have independently reviewed following labs and imaging studies   CBC: Recent Labs  Lab 08/29/18 0835 08/30/18 0226 08/31/18 0243  WBC 14.4* 13.9* 10.3  NEUTROABS  --   --  8.3*  HGB 5.0* 7.8* 7.6*  HCT 18.2* 24.7* 25.3*  MCV 87.1 83.4 86.1  PLT 313 213 194   Basic Metabolic Panel: Recent Labs  Lab 08/29/18 0835 08/30/18 0226 08/31/18 0243  NA 136 133* 134*  K 4.3 3.7 3.7  CL 100 100 102  CO2 26 24 25   GLUCOSE 135* 106* 104*  BUN 23 19 14   CREATININE 1.04* 0.88 0.87  CALCIUM 10.6* 9.8 10.3  MG  --   --  1.7   GFR: Estimated Creatinine Clearance: 67.1 mL/min (by C-G formula based on SCr of 0.87 mg/dL). Liver Function Tests: Recent Labs  Lab 08/29/18 0835  AST 15  ALT 10  ALKPHOS 77  BILITOT 1.3*  PROT 5.2*  ALBUMIN 2.5*   No results for input(s): LIPASE, AMYLASE in the last 168 hours. No results for input(s): AMMONIA in the last 168 hours. Coagulation Profile: Recent Labs  Lab 08/30/18 1029  INR 1.15   Cardiac Enzymes: Recent Labs  Lab 08/29/18 0835  TROPONINI 0.04*   BNP (last 3 results) No results for input(s): PROBNP in the last 8760 hours. HbA1C: No results for input(s): HGBA1C in the last 72 hours. CBG: No results for input(s): GLUCAP in the last 168 hours. Lipid Profile: No results for input(s): CHOL, HDL, LDLCALC, TRIG, CHOLHDL, LDLDIRECT in the last 72 hours. Thyroid Function Tests: Recent Labs     08/31/18 0243  TSH 1.563   Anemia Panel: Recent Labs    08/31/18 0243  FERRITIN 616*  TIBC 318  IRON 27*  RETICCTPCT 5.2*   Urine analysis:    Component Value Date/Time   COLORURINE YELLOW 07/15/2017 1548   APPEARANCEUR CLOUDY (A) 07/15/2017 1548   LABSPEC 1.023 07/15/2017 1548   PHURINE 5.0 07/15/2017 1548   GLUCOSEU NEGATIVE 07/15/2017 [place] 07/15/2017 [place] 07/15/2017 [place] 07/15/2017 1548   PROTEINUR 100 (A) 07/15/2017 1548   NITRITE NEGATIVE 07/15/2017 1548   LEUKOCYTESUR MODERATE (A) 07/15/2017 1548   Sepsis Labs: Invalid input(s): PROCALCITONIN, LACTICIDVEN  Recent Results (from the past 240 hour(s))  MRSA PCR Screening     Status: None   Collection Time: 08/30/18  1:06 AM  Result Value Ref Range Status   MRSA by PCR NEGATIVE NEGATIVE Final    Comment:  The GeneXpert MRSA Assay (FDA approved for NASAL specimens only), is one component of a comprehensive MRSA colonization surveillance program. It is not intended to diagnose MRSA infection nor to guide or monitor treatment for MRSA infections. Performed at [facility], [place] [address], [place], [place] [address]       Radiology Studies: Ct Abdomen Pelvis W Contrast  Result Date: 08/31/2018 CLINICAL DATA:  Pulmonary nodules on previous chest CT suspicious for metastatic disease. EXAM: CT ABDOMEN AND PELVIS WITH CONTRAST TECHNIQUE: Multidetector CT imaging of the abdomen and pelvis was performed using the standard protocol following bolus administration of intravenous contrast. CONTRAST:  145mL OMNIPAQUE IOHEXOL 300 MG/ML  SOLN COMPARISON:  Chest CT 08/29/2018 FINDINGS: Lower chest: Pacer wires noted in the heart. Large hiatal hernia. Bilateral pleural effusions, left greater than right. Pulmonary nodules noted in both lower lungs. Hepatobiliary: No focal abnormality within the liver parenchyma. Liver measures 20.1 cm craniocaudal length.  Dependent sludge or stones noted in the gallbladder. No intrahepatic or extrahepatic biliary dilation. Pancreas: No focal mass lesion. No dilatation of the main duct. No intraparenchymal cyst. No peripancreatic edema. Spleen: Spleen measures 13.7 cm in craniocaudal length, upper normal to mildly increased. Adrenals/Urinary Tract: No adrenal nodule or mass. Kidneys unremarkable. Central sinus cysts noted left kidney. No evidence for hydroureter. The urinary bladder appears normal for the degree of distention. Stomach/Bowel: Large hiatal hernia. Duodenum is normally positioned as is the ligament of Treitz. Duodenal diverticulum noted. No small bowel wall thickening. No small bowel dilatation. The terminal ileum is normal. The appendix is not visualized, but there is no edema or inflammation in the region of the cecum. No gross colonic mass. No colonic wall thickening. Diverticular changes are noted in the left colon without evidence of diverticulitis. Vascular/Lymphatic: There is abdominal aortic atherosclerosis without aneurysm. Mild lymphadenopathy noted hepato duodenal ligament with 13 mm short axis index node visible on 21/3. Borderline para-aortic retroperitoneal lymphadenopathy evident. 10 mm short axis left para-aortic lymph node visible on 31/3. relatively bulky lymphadenopathy is seen along both pelvic sidewalls. 2.2 cm short axis left external iliac node is visible on 76/3. 2.8 cm short axis right external iliac lymph node associated with another 2.9 cm short axis node. 2.2 cm short axis lymph node identified in the right groin. Reproductive: Uterus unremarkable.  There is no adnexal mass. Other: No intraperitoneal free fluid. Musculoskeletal: Extensive body wall edema noted left lower hemithorax and left upper abdominal wall. Degenerative changes noted left hip. IMPRESSION: 1. Bulky pelvic sidewall lymphadenopathy with borderline to mild lymphadenopathy in the hepato duodenal ligament and para-aortic  retroperitoneal space. Lymphoma or metastatic disease would be primary considerations. 2. Hepatomegaly with borderline splenomegaly. Prominent portal and splenic veins raise the question of portal venous hypertension. 3. Gallbladder sludge or tiny stones. 4. Left chest wall and upper abdominal wall edema. 5. Large hiatal hernia with bilateral pleural effusions, similar to prior. 6.  Aortic Atherosclerois (ICD10-170.0) Electronically Signed   By: Misty Stanley M.D.   On: 08/31/2018 13:13   [place] Core Biopsy (lymph Nodes)  Result Date: 08/30/2018 INDICATION: 72 year old with scattered lung lesions, left arm swelling and extensive left axillary lymphadenopathy. Findings are concerning for metastatic disease and tissue diagnosis is needed. EXAM: ULTRASOUND-GUIDED CORE BIOPSY OF LEFT AXILLARY LYMPH NODE MEDICATIONS: None. ANESTHESIA/SEDATION: Moderate (conscious) sedation was employed during this procedure. A total of Versed 0.5 mg and Fentanyl 50 mcg was administered intravenously. Moderate Sedation Time: 15 minutes. The patient's level of consciousness and vital signs  were monitored continuously by radiology nursing throughout the procedure under my direct supervision. FLUOROSCOPY TIME:  None COMPLICATIONS: None immediate. PROCEDURE: Informed written consent was obtained from the patient after a thorough discussion of the procedural risks, benefits and alternatives. All questions were addressed. A timeout was performed prior to the initiation of the procedure. Ultrasound demonstrated enlarged left axillary lymph nodes. The left axilla was prepped and draped in sterile fashion. Skin was anesthetized with 1% lidocaine. 18 gauge core biopsies were obtained of a round enlarged lymph node with ultrasound guidance. Total of 6 core biopsies were obtained. Specimens placed in saline. Bandage placed over the puncture site. FINDINGS: Multiple enlarged hypoechoic lymph nodes in left axilla. Biopsy needle confirmed within the  targeted lymph node. No bleeding or hematoma formation at end of procedure. IMPRESSION: Ultrasound-guided core biopsy of an enlarged left axillary lymph node. Electronically Signed   By: Markus Daft M.D.   On: 08/30/2018 18:08    Marzetta Board, MD, PhD [facility] Pager [phone]  If 7PM-7AM, please contact night-coverage [email or web address] Password [facility] 08/31/2018, 3:23 PM

## 2018-08-31 NOTE — Evaluation (Signed)
Physical Therapy Evaluation Patient Details Name: Patricia Murillo MRN: [id number] DOB: 1946/02/04 Today's Date: 08/31/2018   History of Present Illness  Pt is a 72 y.o. female with complete heart block s/p recent pacemaker placement (04/2018) admitted 08/29/18 with worsening UE pain, fatigue and weakness. [place] negative for LUE DVT. Worked up for GIB with symptomatic anemia and LUE edema. CT Angio on admission with concern for metastatic malignancy. Pt with metastatic cancer with multiple lung mets; s/p US guided core biopsies of enlarged axillary lymph node. PMH includes HTN, arthritis.    Clinical Impression  Pt presents with an overall decrease in functional mobility secondary to above. PTA, pt indep and lives alone. Today, pt required min guard for mobility; limited by pain, fatigue and decreased activity tolerance. Discussed recommendation for SNF-level therapies to maximize functional mobility and independence; pt in agreement she is not safe to return home alone at this point. Will follow acutely to address established goals.     Follow Up Recommendations SNF;Supervision for mobility/OOB    Equipment Recommendations  Rolling walker with 5" wheels;3in1 (PT)    Recommendations for Other Services       Precautions / Restrictions Precautions Precautions: Fall Restrictions Weight Bearing Restrictions: No      Mobility  Bed Mobility Overal bed mobility: Needs Assistance Bed Mobility: Supine to Sit     Supine to sit: Supervision;HOB elevated        Transfers Overall transfer level: Needs assistance Equipment used: None Transfers: Sit to/from Stand Sit to Stand: Min guard         General transfer comment: Reaching for UE support to stabilize upon standing  Ambulation/Gait Ambulation/Gait assistance: Min assist;Min guard Gait Distance (Feet): 30 Feet Assistive device: 1 person hand held assist;Rolling walker (2 wheeled)   Gait velocity: Decreased Gait velocity  interpretation: <1.8 ft/sec, indicate of risk for recurrent falls General Gait Details: Ambulatory in room, requiring minA+HHA to maintain balance. Amb again with RW, stability improved; min guard for balance. Pt easily fatigued by minimal activity  Stairs            Wheelchair Mobility    Modified Rankin (Stroke Patients Only)       Balance Overall balance assessment: Needs assistance   Sitting balance-Leahy Scale: Fair Sitting balance - Comments: Difficulty reaching feet sitting EOB     Standing balance-Leahy Scale: Poor Standing balance comment: Reliant on UE support                             Pertinent Vitals/Pain Pain Assessment: 0-10 Pain Score: 10-Worst pain ever Pain Location: LUE (hand to shoulder) Pain Descriptors / Indicators: Sore;Discomfort;Guarding Pain Intervention(s): Monitored during session;Limited activity within patient's tolerance;Premedicated before session    Home Living Family/patient expects to be discharged to:: Private residence Living Arrangements: Alone Available Help at Discharge: Family;Available PRN/intermittently Type of Home: House Home Access: Stairs to enter Entrance Stairs-Rails: Right   Home Layout: One level Home Equipment: None      Prior Function Level of Independence: Independent               Hand Dominance        Extremity/Trunk Assessment   Upper Extremity Assessment Upper Extremity Assessment: RUE deficits/detail;LUE deficits/detail;Generalized weakness LUE Deficits / Details: diffuse LUE edema; ROM/strength limited secondary to this, but pt still able to grip RW    Lower Extremity Assessment Lower Extremity Assessment: Generalized weakness  Communication   Communication: No difficulties  Cognition Arousal/Alertness: Awake/alert Behavior During Therapy: WFL for tasks assessed/performed Overall Cognitive Status: Within Functional Limits for tasks assessed                                         General Comments General comments (skin integrity, edema, etc.): Son present and supportive. SpO2 94% on RA    Exercises     Assessment/Plan    PT Assessment Patient needs continued PT services  PT Problem List Decreased strength;Decreased activity tolerance;Decreased range of motion;Decreased balance;Decreased mobility;Decreased knowledge of use of DME;Pain       PT Treatment Interventions DME instruction;Gait training;Stair training;Functional mobility training;Therapeutic activities;Therapeutic exercise;Balance training;Patient/family education    PT Goals (Current goals can be found in the Care Plan section)  Acute Rehab PT Goals Patient Stated Goal: Agreeable to SNF- "I can't go home alone like this" PT Goal Formulation: With patient Time For Goal Achievement: 09/14/18 Potential to Achieve Goals: Good    Frequency Min 2X/week   Barriers to discharge        Co-evaluation               AM-PAC PT "6 Clicks" Mobility  Outcome Measure Help needed turning from your back to your side while in a flat bed without using bedrails?: A Little Help needed moving from lying on your back to sitting on the side of a flat bed without using bedrails?: A Little Help needed moving to and from a bed to a chair (including a wheelchair)?: A Little Help needed standing up from a chair using your arms (e.g., wheelchair or bedside chair)?: A Little Help needed to walk in hospital room?: A Little Help needed climbing 3-5 steps with a railing? : A Little 6 Click Score: 18    End of Session Equipment Utilized During Treatment: Gait belt Activity Tolerance: Patient tolerated treatment well;Patient limited by fatigue Patient left: in chair;with call bell/phone within reach;with family/visitor present Nurse Communication: Mobility status PT Visit Diagnosis: Other abnormalities of gait and mobility (R26.89);Muscle weakness (generalized) (M62.81)    Time:  9242-6834 PT Time Calculation (min) (ACUTE ONLY): 31 min   Charges:   PT Evaluation $PT Eval Moderate Complexity: 1 Mod PT Treatments $Gait Training: 8-22 mins      Mabeline Caras, PT, DPT Acute Rehabilitation Services  Pager [phone] Office [place] 08/31/2018, 5:19 PM

## 2018-08-31 NOTE — Progress Notes (Signed)
Patricia Murillo   DOB:[birth year]   TI#:[id number]   ASN#:[id number]  Oncology f/u   Subjective: I stopped by in the late afternoon to discuss her biopsy results.  She is still quite fatigued, was able to walk to bathroom today, and walked with PT, her appetite is low, vital signs stable. She is probably going to a rehab on discharge.    Objective:  Vitals:   08/31/18 1600 08/31/18 1734  BP: (!) 174/80 (!) 174/80  Pulse: 93 93  Resp: 16 16  Temp: 98.2 F (36.8 C) 98.2 F (36.8 C)  SpO2:  94%    Body mass index is 32.1 kg/m.  Intake/Output Summary (Last 24 hours) at 08/31/2018 2129 Last data filed at 08/31/2018 1734 Gross per 24 hour  Intake 595 ml  Output -  Net 595 ml     Sclerae unicteric  Oropharynx clear  No peripheral adenopathy  Lungs clear -- no rales or rhonchi  Heart regular rate and rhythm  Abdomen benign  MSK (+) significant left upper extremity edema  Neuro nonfocal   CBG (last 3)  No results for input(s): GLUCAP in the last 72 hours.   Labs:  Lab Results  Component Value Date   WBC 10.3 08/31/2018   HGB 7.6 (L) 08/31/2018   HCT 25.3 (L) 08/31/2018   MCV 86.1 08/31/2018   PLT 226 08/31/2018   NEUTROABS 8.3 (H) 08/31/2018    CMP Latest Ref Rng & Units 08/31/2018 08/30/2018 08/29/2018  Glucose 70 - 99 mg/dL 104(H) 106(H) 135(H)  BUN 8 - 23 mg/dL 14 19 23   Creatinine 0.44 - 1.00 mg/dL 0.87 0.88 1.04(H)  Sodium 135 - 145 mmol/L 134(L) 133(L) 136  Potassium 3.5 - 5.1 mmol/L 3.7 3.7 4.3  Chloride 98 - 111 mmol/L 102 100 100  CO2 22 - 32 mmol/L 25 24 26   Calcium 8.9 - 10.3 mg/dL 10.3 9.8 10.6(H)  Total Protein 6.5 - 8.1 g/dL - - 5.2(L)  Total Bilirubin 0.3 - 1.2 mg/dL - - 1.3(H)  Alkaline Phos 38 - 126 U/L - - 77  AST 15 - 41 U/L - - 15  ALT 0 - 44 U/L - - 10     Urine Studies No results for input(s): UHGB, CRYS in the last 72 hours.  Invalid input(s): UACOL, UAPR, USPG, UPH, UTP, UGL, UKET, UBIL, UNIT, UROB, ULEU, UEPI, UWBC, URBC, UBAC,  CAST, [place], [place]  Basic Metabolic Panel: Recent Labs  Lab 08/29/18 0835 08/30/18 0226 08/31/18 0243  NA 136 133* 134*  K 4.3 3.7 3.7  CL 100 100 102  CO2 26 24 25   GLUCOSE 135* 106* 104*  BUN 23 19 14   CREATININE 1.04* 0.88 0.87  CALCIUM 10.6* 9.8 10.3  MG  --   --  1.7   GFR Estimated Creatinine Clearance: 67.1 mL/min (by C-G formula based on SCr of 0.87 mg/dL). Liver Function Tests: Recent Labs  Lab 08/29/18 0835  AST 15  ALT 10  ALKPHOS 77  BILITOT 1.3*  PROT 5.2*  ALBUMIN 2.5*   No results for input(s): LIPASE, AMYLASE in the last 168 hours. No results for input(s): AMMONIA in the last 168 hours. Coagulation profile Recent Labs  Lab 08/30/18 1029  INR 1.15    CBC: Recent Labs  Lab 08/29/18 0835 08/30/18 0226 08/31/18 0243  WBC 14.4* 13.9* 10.3  NEUTROABS  --   --  8.3*  HGB 5.0* 7.8* 7.6*  HCT 18.2* 24.7* 25.3*  MCV 87.1 83.4 86.1  PLT 313  213 226   Cardiac Enzymes: Recent Labs  Lab 08/29/18 0835  TROPONINI 0.04*   BNP: Invalid input(s): POCBNP CBG: No results for input(s): GLUCAP in the last 168 hours. D-Dimer No results for input(s): DDIMER in the last 72 hours. Hgb A1c No results for input(s): HGBA1C in the last 72 hours. Lipid Profile No results for input(s): CHOL, HDL, LDLCALC, TRIG, CHOLHDL, LDLDIRECT in the last 72 hours. Thyroid function studies Recent Labs    08/31/18 0243  TSH 1.563   Anemia work up Recent Labs    08/31/18 0243  FERRITIN 616*  TIBC 318  IRON 27*  RETICCTPCT 5.2*   Microbiology Recent Results (from the past 240 hour(s))  MRSA PCR Screening     Status: None   Collection Time: 08/30/18  1:06 AM  Result Value Ref Range Status   MRSA by PCR NEGATIVE NEGATIVE Final    Comment:        The GeneXpert MRSA Assay (FDA approved for NASAL specimens only), is one component of a comprehensive MRSA colonization surveillance program. It is not intended to diagnose MRSA infection nor to guide or monitor  treatment for MRSA infections. Performed at [facility], [place] [address], [place], [place] [address]       Studies:  Ct Abdomen Pelvis W Contrast  Result Date: 08/31/2018 CLINICAL DATA:  Pulmonary nodules on previous chest CT suspicious for metastatic disease. EXAM: CT ABDOMEN AND PELVIS WITH CONTRAST TECHNIQUE: Multidetector CT imaging of the abdomen and pelvis was performed using the standard protocol following bolus administration of intravenous contrast. CONTRAST:  126mL OMNIPAQUE IOHEXOL 300 MG/ML  SOLN COMPARISON:  Chest CT 08/29/2018 FINDINGS: Lower chest: Pacer wires noted in the heart. Large hiatal hernia. Bilateral pleural effusions, left greater than right. Pulmonary nodules noted in both lower lungs. Hepatobiliary: No focal abnormality within the liver parenchyma. Liver measures 20.1 cm craniocaudal length. Dependent sludge or stones noted in the gallbladder. No intrahepatic or extrahepatic biliary dilation. Pancreas: No focal mass lesion. No dilatation of the main duct. No intraparenchymal cyst. No peripancreatic edema. Spleen: Spleen measures 13.7 cm in craniocaudal length, upper normal to mildly increased. Adrenals/Urinary Tract: No adrenal nodule or mass. Kidneys unremarkable. Central sinus cysts noted left kidney. No evidence for hydroureter. The urinary bladder appears normal for the degree of distention. Stomach/Bowel: Large hiatal hernia. Duodenum is normally positioned as is the ligament of Treitz. Duodenal diverticulum noted. No small bowel wall thickening. No small bowel dilatation. The terminal ileum is normal. The appendix is not visualized, but there is no edema or inflammation in the region of the cecum. No gross colonic mass. No colonic wall thickening. Diverticular changes are noted in the left colon without evidence of diverticulitis. Vascular/Lymphatic: There is abdominal aortic atherosclerosis without aneurysm. Mild lymphadenopathy noted hepato duodenal ligament with  13 mm short axis index node visible on 21/3. Borderline para-aortic retroperitoneal lymphadenopathy evident. 10 mm short axis left para-aortic lymph node visible on 31/3. relatively bulky lymphadenopathy is seen along both pelvic sidewalls. 2.2 cm short axis left external iliac node is visible on 76/3. 2.8 cm short axis right external iliac lymph node associated with another 2.9 cm short axis node. 2.2 cm short axis lymph node identified in the right groin. Reproductive: Uterus unremarkable.  There is no adnexal mass. Other: No intraperitoneal free fluid. Musculoskeletal: Extensive body wall edema noted left lower hemithorax and left upper abdominal wall. Degenerative changes noted left hip. IMPRESSION: 1. Bulky pelvic sidewall lymphadenopathy with borderline to mild  lymphadenopathy in the hepato duodenal ligament and para-aortic retroperitoneal space. Lymphoma or metastatic disease would be primary considerations. 2. Hepatomegaly with borderline splenomegaly. Prominent portal and splenic veins raise the question of portal venous hypertension. 3. Gallbladder sludge or tiny stones. 4. Left chest wall and upper abdominal wall edema. 5. Large hiatal hernia with bilateral pleural effusions, similar to prior. 6.  Aortic Atherosclerois (ICD10-170.0) Electronically Signed   By: Misty Stanley M.D.   On: 08/31/2018 13:13   [place] Core Biopsy (lymph Nodes)  Result Date: 08/30/2018 INDICATION: 72 year old with scattered lung lesions, left arm swelling and extensive left axillary lymphadenopathy. Findings are concerning for metastatic disease and tissue diagnosis is needed. EXAM: ULTRASOUND-GUIDED CORE BIOPSY OF LEFT AXILLARY LYMPH NODE MEDICATIONS: None. ANESTHESIA/SEDATION: Moderate (conscious) sedation was employed during this procedure. A total of Versed 0.5 mg and Fentanyl 50 mcg was administered intravenously. Moderate Sedation Time: 15 minutes. The patient's level of consciousness and vital signs were monitored  continuously by radiology nursing throughout the procedure under my direct supervision. FLUOROSCOPY TIME:  None COMPLICATIONS: None immediate. PROCEDURE: Informed written consent was obtained from the patient after a thorough discussion of the procedural risks, benefits and alternatives. All questions were addressed. A timeout was performed prior to the initiation of the procedure. Ultrasound demonstrated enlarged left axillary lymph nodes. The left axilla was prepped and draped in sterile fashion. Skin was anesthetized with 1% lidocaine. 18 gauge core biopsies were obtained of a round enlarged lymph node with ultrasound guidance. Total of 6 core biopsies were obtained. Specimens placed in saline. Bandage placed over the puncture site. FINDINGS: Multiple enlarged hypoechoic lymph nodes in left axilla. Biopsy needle confirmed within the targeted lymph node. No bleeding or hematoma formation at end of procedure. IMPRESSION: Ultrasound-guided core biopsy of an enlarged left axillary lymph node. Electronically Signed   By: Markus Daft M.D.   On: 08/30/2018 18:08    Assessment: 72 y.o. This is a 72 yo female with PMH of GI bleeding in 2009, HIDA hernia,, complete heart block, status post pacemaker placement on right chest wall in July 2019, was admitted for severe anemia, likely secondary to anticoagulation and GI bleeding.   1. Severe anemia, secondary to GI bleeding  2. Metastatic poorly differentiated malignancy, unknown primary  3. History of GI bleeding and hiatal hernia 4. Left arm edema, [place](-) for DVT, likely lymphedema from the left axillary adenopathy 5.  Complete heart block, status post pacemaker placement in July 2019 6. HTN  7. Deconditioning    Recommendations: -I have spoken with pathology today.  The preliminary results of the biopsy is poorly differentiated malignancy, favor carcinoma, unlikely lymphoma, immunostains were ordered today to differentiate primary site, results may be back  by Friday or early next week.  -I discussed the above with pt and his children today -I reviewed her CT abdomen/pel scan in person, and I discussed with patient.  It showed extensive adenopathy, no other primary site tumor was identified. -I spoke with Dr. Cruzita Lederer this morning. Plan to give one unit blood today and start her on oral iron  -I encourage her to participate PT/OT -I will schedule clinic f/u with me next week.     Truitt Merle, MD 08/31/2018  9:29 PM

## 2018-09-01 LAB — TYPE AND SCREEN
ABO/RH(D): O POS
Antibody Screen: NEGATIVE
UNIT DIVISION: 0
UNIT DIVISION: 0
UNIT DIVISION: 0
Unit division: 0

## 2018-09-01 LAB — BPAM RBC
BLOOD PRODUCT EXPIRATION DATE: 201912022359
BLOOD PRODUCT EXPIRATION DATE: 201912022359
BLOOD PRODUCT EXPIRATION DATE: 201912252359
Blood Product Expiration Date: 201912212359
ISSUE DATE / TIME: 201911251123
ISSUE DATE / TIME: 201911251545
ISSUE DATE / TIME: 201911251849
ISSUE DATE / TIME: 201911271255
UNIT TYPE AND RH: 5100
UNIT TYPE AND RH: 5100
Unit Type and Rh: 5100
Unit Type and Rh: 5100

## 2018-09-01 LAB — BASIC METABOLIC PANEL
Anion gap: 8 (ref 5–15)
BUN: 12 mg/dL (ref 8–23)
CHLORIDE: 99 mmol/L (ref 98–111)
CO2: 25 mmol/L (ref 22–32)
CREATININE: 0.9 mg/dL (ref 0.44–1.00)
Calcium: 10.4 mg/dL — ABNORMAL HIGH (ref 8.9–10.3)
GFR calc non Af Amer: >60 mL/min (ref >60)
Glucose, Bld: 106 mg/dL — ABNORMAL HIGH (ref 70–99)
POTASSIUM: 3.4 mmol/L — AB (ref 3.5–5.1)
SODIUM: 132 mmol/L — AB (ref 135–145)

## 2018-09-01 LAB — CBC
HEMATOCRIT: 29.4 % — AB (ref 36.0–46.0)
HEMOGLOBIN: 9.1 g/dL — AB (ref 12.0–15.0)
MCH: 26.1 pg (ref 26.0–34.0)
MCHC: 31 g/dL (ref 30.0–36.0)
MCV: 84.5 fL (ref 80.0–100.0)
NRBC: 0.2 % (ref 0.0–0.2)
Platelets: 197 10*3/uL (ref 150–400)
RBC: 3.48 MIL/uL — ABNORMAL LOW (ref 3.87–5.11)
RDW: 17.6 % — AB (ref 11.5–15.5)
WBC: 10.5 10*3/uL (ref 4.0–10.5)

## 2018-09-01 MED ORDER — FERROUS GLUCONATE 324 (38 FE) MG PO TABS: 324.0000 mg | ORAL_TABLET | Freq: Two times a day (BID) | ORAL | 1 refills | Status: AC

## 2018-09-01 MED ORDER — HYDROCODONE-ACETAMINOPHEN 5-325 MG PO TABS: 1.0000 | ORAL_TABLET | Freq: Four times a day (QID) | ORAL | 0 refills | Status: AC | PRN

## 2018-09-01 MED ORDER — POTASSIUM CHLORIDE CRYS ER 20 MEQ PO TBCR
40.0000 meq | EXTENDED_RELEASE_TABLET | Freq: Once | ORAL | Status: AC
  Administered 2018-09-01: 40 meq via ORAL
  Filled 2018-09-01: qty 2

## 2018-09-01 NOTE — Care Management Note (Signed)
Case Management Note  Patient Details  Name: Patricia Murillo MRN: [id number] Date of Birth: 11-07-1945  Subjective/Objective: Pt Presented for GI Bleed from home alone- has support of family. Family to alternate days and times to stay with mother.                  Action/Plan: CM did offer choice to patient and she chose [facility]- SOC to begin within 24-48 hours. Referral made to [facility]. No further needs from CM at this time.   Expected Discharge Date:  09/01/18               Expected Discharge Plan:  [place]  In-House Referral:  NA  Discharge planning Services  CM Consult  Post Acute Care Choice:  Durable Medical Equipment, Home Health Choice offered to:  Patient  DME Arranged:  Walker rolling DME Agency:  [facility] Arranged:  RN, Disease Management, PT, OT HH Agency:  [facility]  Status of Service:  Completed, signed off  If discussed at [place] of Stay Meetings, dates discussed:    Additional Comments:  Bethena Roys, RN 09/01/2018, 12:04 PM

## 2018-09-01 NOTE — Discharge Instructions (Signed)
Follow with Dr. Burr Medico next week, she will schedule an appointment  Please get a complete blood count and chemistry panel checked by your Primary MD at your next visit, and again as instructed by your Primary MD. Please get your medications reviewed and adjusted by your Primary MD.  Please request your Primary MD to go over all Hospital Tests and Procedure/Radiological results at the follow up, please get all Hospital records sent to your Prim MD by signing hospital release before you go home.  If you had Pneumonia of Lung problems at the Hospital: Please get a 2 view Chest X ray done in 6-8 weeks after hospital discharge or sooner if instructed by your Primary MD.  If you have Congestive Heart Failure: Please call your Cardiologist or Primary MD anytime you have any of the following symptoms:  1) 3 pound weight gain in 24 hours or 5 pounds in 1 week  2) shortness of breath, with or without a dry hacking cough  3) swelling in the hands, feet or stomach  4) if you have to sleep on extra pillows at night in order to breathe  Follow cardiac low salt diet and 1.5 lit/day fluid restriction.  If you have diabetes Accuchecks 4 times/day, Once in AM empty stomach and then before each meal. Log in all results and show them to your primary doctor at your next visit. If any glucose reading is under 80 or above 300 call your primary MD immediately.  If you have Seizure/Convulsions/Epilepsy: Please do not drive, operate heavy machinery, participate in activities at heights or participate in high speed sports until you have seen by Primary MD or a Neurologist and advised to do so again.  If you had Gastrointestinal Bleeding: Please ask your Primary MD to check a complete blood count within one week of discharge or at your next visit. Your endoscopic/colonoscopic biopsies that are pending at the time of discharge, will also need to followed by your Primary MD.  Get Medicines reviewed and  adjusted. Please take all your medications with you for your next visit with your Primary MD  Please request your Primary MD to go over all hospital tests and procedure/radiological results at the follow up, please ask your Primary MD to get all Hospital records sent to his/her office.  If you experience worsening of your admission symptoms, develop shortness of breath, life threatening emergency, suicidal or homicidal thoughts you must seek medical attention immediately by calling 911 or calling your MD immediately  if symptoms less severe.  You must read complete instructions/literature along with all the possible adverse reactions/side effects for all the Medicines you take and that have been prescribed to you. Take any new Medicines after you have completely understood and accpet all the possible adverse reactions/side effects.   Do not drive or operate heavy machinery when taking Pain medications.   Do not take more than prescribed Pain, Sleep and Anxiety Medications  Special Instructions: If you have smoked or chewed Tobacco  in the last 2 yrs please stop smoking, stop any regular Alcohol  and or any Recreational drug use.  Wear Seat belts while driving.  Please note You were cared for by a hospitalist during your hospital stay. If you have any questions about your discharge medications or the care you received while you were in the hospital after you are discharged, you can call the unit and asked to speak with the hospitalist on call if the hospitalist that took care of you is  not available. Once you are discharged, your primary care physician will handle any further medical issues. Please note that NO REFILLS for any discharge medications will be authorized once you are discharged, as it is imperative that you return to your primary care physician (or establish a relationship with a primary care physician if you do not have one) for your aftercare needs so that they can reassess your need  for medications and monitor your lab values.  You can reach the hospitalist office at phone [phone] or fax [phone]   If you do not have a primary care physician, you can call [phone] for a physician referral.  Activity: As tolerated with Full fall precautions use walker/cane & assistance as needed  Diet: regular  Disposition Home

## 2018-09-01 NOTE — Progress Notes (Signed)
Occupational Therapy Evaluation   Patient evaluated by Occupational Therapy with no further acute OT needs identified. All education has been completed and the patient has no further questions. Pt requires min A for ADLs. All further needs can be addressed by [place]. See below for any follow-up Occupational Therapy or equipment needs. OT is signing off. Thank you for this referral.    09/01/18 1100  OT Visit Information  Last OT Received On 09/01/18  Assistance Needed +1  History of Present Illness Pt is a 72 y.o. female with complete heart block s/p recent pacemaker placement (04/2018) admitted 08/29/18 with worsening UE pain, fatigue and weakness. [place] negative for LUE DVT. Worked up for GIB with symptomatic anemia and LUE edema. CT Angio on admission with concern for metastatic malignancy. also showed "asymmetric soft tissue enlargement of Lt pectoralis muscle suspicious for intramuscular hemorrhage  and edema.  An infiltrating mass or metastasis is also a differential consideration".   Pt with metastatic cancer with multiple lung mets; s/p US guided core biopsies of enlarged axillary lymph node. Biopsy results metastatic poorly differntiated malignancy, unknown primary.   PMH includes HTN, arthritis.  Precautions  Precautions Fall  Home Living  Family/patient expects to be discharged to: Private residence  Living Arrangements Alone  Available Help at Discharge Family;Available 24 hours/day  Type of Home House  Home Access Stairs to enter  Entrance Stairs-Rails Right  Home Layout One level  Bathroom Shower/Tub Tub/shower unit  Financial controller seat  Additional Comments pt's children will be providing 24 hour assist at discharge (daughter present and confirms)  Prior Function  Level of Independence Independent  Communication  Communication No difficulties  Pain Assessment  Pain Assessment No/denies pain  Cognition  Arousal/Alertness Awake/alert  Behavior  During Therapy Flat affect  General Comments pt demonstrates poor ability to generalize deficits.  She demonstrated significant difficulty donning socks due to Lt UE weakness, but she repeatedly stated "I don't wear this kind of sock at home.  she was noticeably DOE (3/4) with activity.  When performing activity she acknowledged fatigue, but once back in room, sitting EOB insists she was not fatigued despite DOE.   Upper Extremity Assessment  Upper Extremity Assessment LUE deficits/detail  LUE Deficits / Details 4+ edema Lt UE.  She is able elevate Lt shoulder ~40* using shoulder elevation to achieve movement.  She is unable to move hand to mouth, and demonstrates strength Lt hand grossly 2-/5.     LUE Coordination decreased fine motor;decreased gross motor  Lower Extremity Assessment  Lower Extremity Assessment Defer to PT evaluation  Cervical / Trunk Assessment  Cervical / Trunk Assessment Normal  ADL  Overall ADL's  Needs assistance/impaired  Eating/Feeding Modified independent;Sitting  Grooming Wash/dry hands;Wash/dry face;Oral care;Brushing hair;Min guard;Standing  Upper Body Bathing Minimal assistance;Sitting  Upper Body Bathing Details (indicate cue type and reason) difficulty accessing Rt UE and axilla   Lower Body Bathing Min guard;Sit to/from stand  Upper Body Dressing  Minimal assistance;Sitting  Upper Body Dressing Details (indicate cue type and reason) assist to thread Lt UE through sleeve and for fasteners   Lower Body Dressing Minimal assistance  Lower Body Dressing Details (indicate cue type and reason) Pt requires increased time and demonstrated difficulty donning socks due to Lt hand weakness.  Requires assist with fasteners   Toilet Transfer Min guard;Ambulation;Comfort height toilet;Grab bars;RW  Toileting- Clothing Manipulation and Hygiene Minimal assistance;Sit to/from stand  Toileting - Clothing Manipulation Details (indicate cue type  and reason) assist for fasteners    Tub/Shower Transfer Details (indicate cue type and reason) instructed pt's daughter to assist pt with tub transfers and she agreed   Functional mobility during ADLs Min guard;Rolling walker  Vision- History  Patient Visual Report No change from baseline  Bed Mobility  General bed mobility comments Pt sitting EOB   Transfers  Overall transfer level Needs assistance  Equipment used Rolling walker (2 wheeled)  Transfers Sit to/from [facility]  Sit to Stand Min guard  Stand pivot transfers Min guard  General transfer comment verbal cues for hand placement and min guard assist for safety   Balance  Overall balance assessment Needs assistance  Sitting-balance support Feet supported;No upper extremity supported  Sitting balance-Leahy Scale Good  Standing balance support Bilateral upper extremity supported  Standing balance-Leahy Scale Poor  Standing balance comment reliant on UE support   Exercises  Exercises Other exercises  Other Exercises  Other Exercises Pt instructed in lap slides for Lt UE   OT - End of Session  Equipment Utilized During Treatment Rolling walker  Activity Tolerance Patient tolerated treatment well  Patient left in bed;with call bell/phone within reach;with family/visitor present (EOB)  Nurse Communication Mobility status  OT Assessment  OT Recommendation/Assessment All further OT needs can be met in the next venue of care  OT Visit Diagnosis Unsteadiness on feet (R26.81)  OT Problem List Decreased strength;Decreased activity tolerance;Impaired balance (sitting and/or standing);Decreased cognition;Decreased safety awareness;Decreased knowledge of use of DME or AE;Impaired UE functional use;Increased edema  AM-PAC OT "6 Clicks" Daily Activity Outcome Measure (Version 2)  Help from another person eating meals? 4  Help from another person taking care of personal grooming? 3  Help from another person toileting, which includes using toliet, bedpan, or  urinal? 3  Help from another person bathing (including washing, rinsing, drying)? 3  Help from another person to put on and taking off regular upper body clothing? 3  Help from another person to put on and taking off regular lower body clothing? 3  6 Click Score 19  OT Recommendation  Follow Up Recommendations Home health OT;Supervision/Assistance - 24 hour  OT Equipment None recommended by OT  Individuals Consulted  Consulted and Agree with Results and Recommendations Patient  Acute Rehab OT Goals  Patient Stated Goal to go home today   OT Goal Formulation All assessment and education complete, DC therapy  OT Time Calculation  OT Start Time (ACUTE ONLY) 1052  OT Stop Time (ACUTE ONLY) 1107  OT Time Calculation (min) 15 min  OT General Charges  $OT Visit 1 Visit  OT Evaluation  $OT Eval Moderate Complexity 1 Mod  Written Expression  Dominant Hand Right  Lucille Passy, OTR/L Acute Rehabilitation Services Pager [phone] Office [phone]

## 2018-09-01 NOTE — Discharge Summary (Signed)
Physician Discharge Summary  ELLISHA BANKSON ION:[id number] DOB: 04/27/1946 DOA: 08/29/2018  PCP: Patient, No Pcp Per  Admit date: 08/29/2018 Discharge date: 09/01/2018  Admitted From: home Disposition:  Home (refusing SNF)  Recommendations for Outpatient Follow-up:  1. Follow up with PCP in 1-2 weeks 2. Follow-up with Dr. Burr Medico next week  Home Health: PT, OT, RN Equipment/Devices: none  Discharge Condition: stable CODE STATUS: DNR Diet recommendation: regular  HPI: Per Dr. Lorin Mercy, ALESSIA GONSALEZ is a 72 y.o. female with medical history significant of HLD and complete heart block s/p pacemaker placement in 7/19 presenting with fatigue.  She has had arm pain and been nauseated with heaves which has resolved.  EP team tried to place a pacemaker on the left on 7/15 without success; since then her arm has had increasing swelling.  They were concerned about a blood clot on Friday and started her on Eliquis.  She took her first dose Friday night and she developed dark stools on Saturday.  She stopped the Eliquis Sunday.  She does not report known h/o afib and has not bene on [facility] before.  She has been weak and SOB with exertion.  No prior h/o transfusion.  She has had colonoscopy x 2, most recent in 2008. ED Course:  Started on Eliquis Friday for suspected UE DVT due to progressive LUE edema.  Black stools since Friday - no Eliquis since Sunday AM.  Came in for weakness, dizziness, SOB.  Transfuse 2 units PRBC.  [place] is negative for DVT, but with profound lymphedema - does she need a CT to cause compressive lymph systems.   Hospital Course: Principal problem GI bleeding with symptomatic anemia -she presented to the hospital due to "Reports not feeling well the last couple of weeks with difficulty doing ADLs, dark stools after started on eliquis.  She was found to have a low hemoglobin on presentation around 5, she was transfused a total of 4 units of packed red blood cells with adequate  improvement in her hemoglobin in the 9 range.  She had no evidence of GI bleed while hospitalized.  [place] gastroenterology was consulted and followed patient while hospitalized, likely has intermittent GI bleed from her large hiatal hernia exacerbated by Eliquis, did not recommend EGD at this point.  Her Eliquis was discontinued as she had the left upper extremity ultrasound which was negative for DVT.  Additional Problems Metastatic cancer with multiple lung mets -on initial evaluation she was found to have likely underlying malignancy on imaging, oncology and IR were consulted, and patient is s/pUS guided core biopsies of enlarged left axillary lymph node. Oncology Dr. Annamaria Boots consulted, preliminary pathology does indeed shows poorly differentiated malignancy.  CT scan of the abdomen and pelvis with extensive lymphadenopathy but no clear primary tumor present.  She is to have further work-up as an outpatient, will follow up with oncology in a week.  She will have additional stains of her biopsy hopefully that will clear possible primary origin Left arm edema -per EP note, patient has left subclavian vein occlusion, she is started on eliquis for this, however ultrasound was negative for DVT and Eliquis has been discontinued.  Her swelling may be related to her underlying malignancy per oncology.  Underwent a 2D echo which was negative for thrombus Third degree heart block status post pacemaker placementin July 2019  Discharge Diagnoses:  Principal Problem:   GI bleeding Active Problems:   Hyperlipidemia   Complete heart block (HCC)   Symptomatic anemia  Lymphedema of left arm   Left upper extremity swelling   Metastatic cancer ([place])   Hiatal hernia     Discharge Instructions   Allergies as of 09/01/2018   No Known Allergies     Medication List    TAKE these medications   acetaminophen 650 MG CR tablet Commonly known as:  TYLENOL Take 1,300 mg by mouth every 8 (eight) hours as  needed for pain.   azelastine 0.05 % ophthalmic solution Commonly known as:  OPTIVAR Place 1 drop into both eyes 2 (two) times daily. What changed:    when to take this  reasons to take this   ferrous gluconate 324 MG tablet Commonly known as:  FERGON Take 1 tablet (324 mg total) by mouth 2 (two) times daily with a meal.   HYDROcodone-acetaminophen 5-325 MG tablet Commonly known as:  NORCO/VICODIN Take 1 tablet by mouth every 6 (six) hours as needed for up to 5 days for moderate pain.   meclizine 12.5 MG tablet Commonly known as:  ANTIVERT Take 1 tablet (12.5 mg total) by mouth 3 (three) times daily as needed for dizziness.   Vitamin D (Cholecalciferol) 50 MCG (2000 UT) Caps Take 2,000 Units by mouth daily.      Follow-up Information    Koirala, Dibas, MD Follow up on 09/16/2018.   Specialty:  Family Medicine Why:  11:30 for hospital follow up , please bring your insurance card and drivers license. Contact information: [place] [address] [phone]        Ronald Lobo, MD Follow up.   Specialty:  Gastroenterology Why:  gi bleed Contact information: [address] [place] [address] [phone]        Truitt Merle, MD Follow up in 1 week(s).   Specialties:  Hematology, Oncology Contact information: [place] [address] [place], [facility] Follow up.   Specialty:  Home Health Services Why:  Physical Therapy, Occupational Therapy, Registered Nurse.  Contact information: [place] [address] [phone]           Consultations:  Oncology   IR  Procedures/Studies:  2D echo  Study Conclusions - Left ventricle: The cavity size was normal. Systolic function was normal. The estimated ejection fraction was in the range of 55% to 60%. Wall motion was normal; there were no regional wall motion abnormalities.  Doppler parameters are consistent with abnormal left ventricular relaxation (grade 1 diastolic dysfunction). - Aortic valve: There was no regurgitation. - Mitral valve: There was trivial regurgitation. - Left atrium: The atrium was mildly dilated. - Right atrium: The atrium was moderately dilated. - Atrial septum: There was a patent foramen ovale. - Tricuspid valve: There was mild-moderate regurgitation. - Pulmonary arteries: Systolic pressure was moderately increased. PA peak pressure: 44 mm Hg (S).  Impressions:  - Normal LV EF with grade 1 diastolic dysfunction. PFO.   Mild-moderate TR with elevated PASP.   Ct Angio Chest Pe W Or Wo Contrast  Result Date: 08/29/2018 CLINICAL DATA:  Severe left arm swelling since yesterday. Hemoglobin L5. EXAM: CT ANGIOGRAPHY CHEST WITH CONTRAST TECHNIQUE: Multidetector CT imaging of the chest was performed using the standard protocol during bolus administration of intravenous contrast. Multiplanar CT image reconstructions and MIPs were obtained to evaluate the vascular anatomy. CONTRAST:  100 cc ISOVUE-370 IOPAMIDOL (ISOVUE-370) INJECTION 76% COMPARISON:  CXR 08/29/2018 FINDINGS: Cardiovascular: Conventional branch pattern  of the great vessels with minimal atherosclerosis at the origins. No significant stenosis. Patent left subclavian artery traversing an area of ill-defined soft tissue edema and enlargement as described below. Mild aortic atherosclerosis without aneurysm or dissection. Satisfactory opacification of the pulmonary arteries to the proximal segmental level without large central pulmonary embolus. Mediastinum/Nodes: Left axillary nodal enlargement is identified ranging size from 0.3 cm to 2.8 cm short axis. Mildly enlarged right-sided axillary lymph nodes up to 1.3 cm short axis are identified. Large hiatal hernia with air-fluid level noted within. 1.4 cm short axis mass abutting the left lateral aspect of the aortic arch in the prevascular space  is noted more likely to represent a pulmonary lesion as opposed lymphadenopathy. Nonpathologic sized mediastinal and hilar lymph nodes are noted. Lungs/Pleura: Masslike opacities are identified in the lingula, the largest abutting the pleural surface measuring 3.3 x 1.7 cm on series 5/92 with previously mentioned paramediastinal mass in the left upper lobe measuring 2.3 x 1.4 cm, series 5/49. Superior segment left lower lobe mass measuring 1.8 x 1.3 cm, series 5/52 is also noted. There are smaller subcentimeter nodules within both upper lobes, right middle lobe and right lower lobe with largest masslike opacities in the right lower lobe measuring 1.1 x 1 cm, series 5/89 and abutting the right lateral margin of the hiatal hernia measuring 2.5 x 1.9 cm, series 5/105. Moderate left effusion with compressive atelectasis is identified with trace right pleural effusion. No pneumothorax. Upper Abdomen: No space-occupying mass of the included liver. The included right adrenal gland is unremarkable. Nodular soft tissue densities in the left upper quadrant are felt to represent partial imaging of small bowel less likely mesenteric adenopathy. The included spleen is top-normal in size. Musculoskeletal: Ill-defined asymmetric soft tissue enlargement in the region of the left pectoralis muscle suspicious for intramuscular hemorrhage and edema given reported rapid onset of left arm swelling and clinical report of low hemoglobin. Asymmetric subcutaneous edema of the partially included left breast with thickening of the skin concerning for possible possible inflammatory carcinoma of the breast. Right-sided pacemaker apparatus is noted with leads in the right atrium right ventricle. Thoracic spondylosis is noted. Review of the MIP images confirms the above findings. IMPRESSION: 1. No acute large central pulmonary embolus, aortic aneurysm or dissection. 2. Multilobar masslike opacities scattered throughout both lungs as above  described, the largest in the lingula measuring up to 3.3 cm, presumed metastatic given left axillary adenopathy also noted. 3. Left axillary nodal enlargement measuring up to 2.8 cm short axis. 4. Asymmetric soft tissue enlargement of the left pectoralis muscle suspicious for intramuscular hemorrhage and edema given reported rapid onset of left arm swelling and clinical report of low hemoglobin. An infiltrating mass or metastasis is also a differential considerations given local adenopathy, asymmetric subcutaneous edema of the partially included left breast with thickening of the skin concerning for possible inflammatory carcinoma of the breast. Clinical correlation is therefore recommended as well as mammographic correlation. 5. Moderate left and trace right pleural effusions with compressive atelectasis. These results were called by telephone at the time of interpretation on 08/29/2018 at 3:43 pm to Dr. Karmen Bongo , who verbally acknowledged these results. Aortic Atherosclerosis (ICD10-I70.0). Electronically Signed   By: Ashley Royalty M.D.   On: 08/29/2018 15:43   Ct Abdomen Pelvis W Contrast  Result Date: 08/31/2018 CLINICAL DATA:  Pulmonary nodules on previous chest CT suspicious for metastatic disease. EXAM: CT ABDOMEN AND PELVIS WITH CONTRAST TECHNIQUE: Multidetector CT imaging of the abdomen  and pelvis was performed using the standard protocol following bolus administration of intravenous contrast. CONTRAST:  144mL OMNIPAQUE IOHEXOL 300 MG/ML  SOLN COMPARISON:  Chest CT 08/29/2018 FINDINGS: Lower chest: Pacer wires noted in the heart. Large hiatal hernia. Bilateral pleural effusions, left greater than right. Pulmonary nodules noted in both lower lungs. Hepatobiliary: No focal abnormality within the liver parenchyma. Liver measures 20.1 cm craniocaudal length. Dependent sludge or stones noted in the gallbladder. No intrahepatic or extrahepatic biliary dilation. Pancreas: No focal mass lesion. No  dilatation of the main duct. No intraparenchymal cyst. No peripancreatic edema. Spleen: Spleen measures 13.7 cm in craniocaudal length, upper normal to mildly increased. Adrenals/Urinary Tract: No adrenal nodule or mass. Kidneys unremarkable. Central sinus cysts noted left kidney. No evidence for hydroureter. The urinary bladder appears normal for the degree of distention. Stomach/Bowel: Large hiatal hernia. Duodenum is normally positioned as is the ligament of Treitz. Duodenal diverticulum noted. No small bowel wall thickening. No small bowel dilatation. The terminal ileum is normal. The appendix is not visualized, but there is no edema or inflammation in the region of the cecum. No gross colonic mass. No colonic wall thickening. Diverticular changes are noted in the left colon without evidence of diverticulitis. Vascular/Lymphatic: There is abdominal aortic atherosclerosis without aneurysm. Mild lymphadenopathy noted hepato duodenal ligament with 13 mm short axis index node visible on 21/3. Borderline para-aortic retroperitoneal lymphadenopathy evident. 10 mm short axis left para-aortic lymph node visible on 31/3. relatively bulky lymphadenopathy is seen along both pelvic sidewalls. 2.2 cm short axis left external iliac node is visible on 76/3. 2.8 cm short axis right external iliac lymph node associated with another 2.9 cm short axis node. 2.2 cm short axis lymph node identified in the right groin. Reproductive: Uterus unremarkable.  There is no adnexal mass. Other: No intraperitoneal free fluid. Musculoskeletal: Extensive body wall edema noted left lower hemithorax and left upper abdominal wall. Degenerative changes noted left hip. IMPRESSION: 1. Bulky pelvic sidewall lymphadenopathy with borderline to mild lymphadenopathy in the hepato duodenal ligament and para-aortic retroperitoneal space. Lymphoma or metastatic disease would be primary considerations. 2. Hepatomegaly with borderline splenomegaly. Prominent  portal and splenic veins raise the question of portal venous hypertension. 3. Gallbladder sludge or tiny stones. 4. Left chest wall and upper abdominal wall edema. 5. Large hiatal hernia with bilateral pleural effusions, similar to prior. 6.  Aortic Atherosclerois (ICD10-170.0) Electronically Signed   By: Misty Stanley M.D.   On: 08/31/2018 13:13   Dg Chest Portable 1 View  Result Date: 08/29/2018 CLINICAL DATA:  72 year old female with a history of shortness of breath and edema EXAM: PORTABLE CHEST 1 VIEW COMPARISON:  04/19/2018, 04/18/2018 FINDINGS: Cardiomediastinal silhouette unchanged in size and contour. Double density projects over the lower mediastinum is unchanged. On the frontal view there is a Passy at the left base obscuring the left hemidiaphragm with blunting of left costophrenic angle, increased from the prior. No pneumothorax. Unchanged right chest wall cardiac pacing device with 2 leads in place. No confluent airspace disease on the right. IMPRESSION: New opacity at the left lung base obscuring left hemidiaphragm in the left heart border, compatible with pleural effusion and associated atelectasis/consolidation. Hiatal hernia. Electronically Signed   By: Corrie Mckusick D.O.   On: 08/29/2018 09:09   [place] Core Biopsy (lymph Nodes)  Result Date: 08/30/2018 INDICATION: 72 year old with scattered lung lesions, left arm swelling and extensive left axillary lymphadenopathy. Findings are concerning for metastatic disease and tissue diagnosis is needed. EXAM: ULTRASOUND-GUIDED CORE  BIOPSY OF LEFT AXILLARY LYMPH NODE MEDICATIONS: None. ANESTHESIA/SEDATION: Moderate (conscious) sedation was employed during this procedure. A total of Versed 0.5 mg and Fentanyl 50 mcg was administered intravenously. Moderate Sedation Time: 15 minutes. The patient's level of consciousness and vital signs were monitored continuously by radiology nursing throughout the procedure under my direct supervision. FLUOROSCOPY TIME:   None COMPLICATIONS: None immediate. PROCEDURE: Informed written consent was obtained from the patient after a thorough discussion of the procedural risks, benefits and alternatives. All questions were addressed. A timeout was performed prior to the initiation of the procedure. Ultrasound demonstrated enlarged left axillary lymph nodes. The left axilla was prepped and draped in sterile fashion. Skin was anesthetized with 1% lidocaine. 18 gauge core biopsies were obtained of a round enlarged lymph node with ultrasound guidance. Total of 6 core biopsies were obtained. Specimens placed in saline. Bandage placed over the puncture site. FINDINGS: Multiple enlarged hypoechoic lymph nodes in left axilla. Biopsy needle confirmed within the targeted lymph node. No bleeding or hematoma formation at end of procedure. IMPRESSION: Ultrasound-guided core biopsy of an enlarged left axillary lymph node. Electronically Signed   By: Markus Daft M.D.   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 Limitations: Body habitus, swelling and poor ultrasound/tissue interface. Comparison Study: No prior study on file for comparison. Performing Technologist: Sharion Dove RVS  Examination Guidelines: A complete evaluation includes B-mode imaging, spectral Doppler, color Doppler, and power Doppler as needed of all accessible portions of each vessel. Bilateral testing is considered an integral part of a complete examination. Limited examinations for reoccurring indications may be performed as noted.  Right Findings: +----------+------------+----------+---------+-----------+-------+ RIGHT     CompressiblePropertiesPhasicitySpontaneousSummary +----------+------------+----------+---------+-----------+-------+ Subclavian                         Yes       Yes             +----------+------------+----------+---------+-----------+-------+  Left Findings: +----------+------------+----------+---------+-----------+-------+ LEFT      CompressiblePropertiesPhasicitySpontaneousSummary +----------+------------+----------+---------+-----------+-------+ IJV                                Yes       Yes            +----------+------------+----------+---------+-----------+-------+ Subclavian                         Yes       Yes            +----------+------------+----------+---------+-----------+-------+ Axillary      Full                                          +----------+------------+----------+---------+-----------+-------+ Brachial      Full                                          +----------+------------+----------+---------+-----------+-------+ Cephalic      Full                                          +----------+------------+----------+---------+-----------+-------+  Basilic       Full                                          +----------+------------+----------+---------+-----------+-------+  Summary:  Right: No evidence of thrombosis in the subclavian.  Left: No evidence of deep vein thrombosis in the upper extremity. However, unable to visualize the radial and ulnar. No evidence of superficial vein thrombosis in the upper extremity. However, unable to visualize the radial and ulnar. No evidence of thrombosis in the . However, unable to visualize the radial and ulnar. This was a limited study.  *See table(s) above for measurements and observations.  Diagnosing physician: Deitra Mayo MD Electronically signed by Deitra Mayo MD on 08/29/2018 at 4:20:56 PM.    Final       Subjective: - no chest pain, shortness of breath, no abdominal pain, nausea or vomiting.   Discharge Exam: Vitals:   08/31/18 2308 09/01/18 0811  BP: 120/64 (!) 120/55  Pulse: 90 86  Resp: 16   Temp: (!) 97.3 F (36.3 C) 98 F (36.7 C)    SpO2: 91% 94%    General: Pt is alert, awake, not in acute distress Cardiovascular: RRR, S1/S2 +, no rubs, no gallops Respiratory: CTA bilaterally, no wheezing, no rhonchi Abdominal: Soft, NT, ND, bowel sounds + Extremities: no edema, no cyanosis    The results of significant diagnostics from this hospitalization (including imaging, microbiology, ancillary and laboratory) are listed below for reference.     Microbiology: Recent Results (from the past 240 hour(s))  MRSA PCR Screening     Status: None   Collection Time: 08/30/18  1:06 AM  Result Value Ref Range Status   MRSA by PCR NEGATIVE NEGATIVE Final    Comment:        The GeneXpert MRSA Assay (FDA approved for NASAL specimens only), is one component of a comprehensive MRSA colonization surveillance program. It is not intended to diagnose MRSA infection nor to guide or monitor treatment for MRSA infections. Performed at [facility], [place] [address] [place], [place] [address]      Labs: BNP (last 3 results) No results for input(s): BNP in the last 8760 hours. Basic Metabolic Panel: Recent Labs  Lab 08/29/18 0835 08/30/18 0226 08/31/18 0243 09/01/18 0435  NA 136 133* 134* 132*  K 4.3 3.7 3.7 3.4*  CL 100 100 102 99  CO2 26 24 25 25   GLUCOSE 135* 106* 104* 106*  BUN 23 19 14 12   CREATININE 1.04* 0.88 0.87 0.90  CALCIUM 10.6* 9.8 10.3 10.4*  MG  --   --  1.7  --    Liver Function Tests: Recent Labs  Lab 08/29/18 0835  AST 15  ALT 10  ALKPHOS 77  BILITOT 1.3*  PROT 5.2*  ALBUMIN 2.5*   No results for input(s): LIPASE, AMYLASE in the last 168 hours. No results for input(s): AMMONIA in the last 168 hours. CBC: Recent Labs  Lab 08/29/18 0835 08/30/18 0226 08/31/18 0243 09/01/18 0435  WBC 14.4* 13.9* 10.3 10.5  NEUTROABS  --   --  8.3*  --   HGB 5.0* 7.8* 7.6* 9.1*  HCT 18.2* 24.7* 25.3* 29.4*  MCV 87.1 83.4 86.1 84.5  PLT 313 213 226 197   Cardiac Enzymes: Recent Labs  Lab  08/29/18 0835  TROPONINI 0.04*   BNP: Invalid input(s): POCBNP CBG: No results  for input(s): GLUCAP in the last 168 hours. D-Dimer No results for input(s): DDIMER in the last 72 hours. Hgb A1c No results for input(s): HGBA1C in the last 72 hours. Lipid Profile No results for input(s): CHOL, HDL, LDLCALC, TRIG, CHOLHDL, LDLDIRECT in the last 72 hours. Thyroid function studies Recent Labs    08/31/18 0243  TSH 1.563   Anemia work up Recent Labs    08/31/18 0243  FERRITIN 616*  TIBC 318  IRON 27*  RETICCTPCT 5.2*   Urinalysis    Component Value Date/Time   COLORURINE YELLOW 07/15/2017 1548   APPEARANCEUR CLOUDY (A) 07/15/2017 1548   LABSPEC 1.023 07/15/2017 1548   PHURINE 5.0 07/15/2017 [place] 07/15/2017 [place] 07/15/2017 [place] 07/15/2017 [place] 07/15/2017 1548   PROTEINUR 100 (A) 07/15/2017 1548   NITRITE NEGATIVE 07/15/2017 1548   LEUKOCYTESUR MODERATE (A) 07/15/2017 1548   Sepsis Labs Invalid input(s): PROCALCITONIN,  WBC,  LACTICIDVEN   Time coordinating discharge: 35 minutes  SIGNED:  Marzetta Board, MD  [facility] 09/01/2018, 3:01 PM Pager [phone]  If 7PM-7AM, please contact night-coverage [email or web address] Password [email or web address]

## 2018-09-03 DIAGNOSIS — K449 Diaphragmatic hernia without obstruction or gangrene: Secondary | ICD-10-CM | POA: Diagnosis not present

## 2018-09-03 DIAGNOSIS — E785 Hyperlipidemia, unspecified: Secondary | ICD-10-CM | POA: Diagnosis not present

## 2018-09-03 DIAGNOSIS — D500 Iron deficiency anemia secondary to blood loss (chronic): Secondary | ICD-10-CM | POA: Diagnosis not present

## 2018-09-03 DIAGNOSIS — K922 Gastrointestinal hemorrhage, unspecified: Secondary | ICD-10-CM | POA: Diagnosis not present

## 2018-09-03 DIAGNOSIS — I442 Atrioventricular block, complete: Secondary | ICD-10-CM | POA: Diagnosis not present

## 2018-09-03 DIAGNOSIS — C7800 Secondary malignant neoplasm of unspecified lung: Secondary | ICD-10-CM | POA: Diagnosis not present

## 2018-09-03 DIAGNOSIS — I890 Lymphedema, not elsewhere classified: Secondary | ICD-10-CM | POA: Diagnosis not present

## 2018-09-03 DIAGNOSIS — C8510 Unspecified B-cell lymphoma, unspecified site: Secondary | ICD-10-CM | POA: Diagnosis not present

## 2018-09-03 DIAGNOSIS — M150 Primary generalized (osteo)arthritis: Secondary | ICD-10-CM | POA: Diagnosis not present

## 2018-09-05 ENCOUNTER — Telehealth: Payer: Self-pay | Admitting: Hematology

## 2018-09-05 DIAGNOSIS — I890 Lymphedema, not elsewhere classified: Secondary | ICD-10-CM | POA: Diagnosis not present

## 2018-09-05 DIAGNOSIS — K449 Diaphragmatic hernia without obstruction or gangrene: Secondary | ICD-10-CM | POA: Diagnosis not present

## 2018-09-05 DIAGNOSIS — D500 Iron deficiency anemia secondary to blood loss (chronic): Secondary | ICD-10-CM | POA: Diagnosis not present

## 2018-09-05 DIAGNOSIS — C8510 Unspecified B-cell lymphoma, unspecified site: Secondary | ICD-10-CM | POA: Diagnosis not present

## 2018-09-05 DIAGNOSIS — C7800 Secondary malignant neoplasm of unspecified lung: Secondary | ICD-10-CM | POA: Diagnosis not present

## 2018-09-05 DIAGNOSIS — K922 Gastrointestinal hemorrhage, unspecified: Secondary | ICD-10-CM | POA: Diagnosis not present

## 2018-09-05 NOTE — Telephone Encounter (Signed)
Cld and spoke to the pt's daughter Lannette Donath to schedule an appt w/Dr. Burr Medico. Pt has been scheduled to see Dr. Burr Medico on 12/4 at 230pm. Aware to arrive 30 minutes early.

## 2018-09-07 ENCOUNTER — Inpatient Hospital Stay: Payer: Medicare HMO | Admitting: Hematology

## 2018-09-07 ENCOUNTER — Emergency Department (HOSPITAL_COMMUNITY): Payer: Medicare HMO

## 2018-09-07 ENCOUNTER — Inpatient Hospital Stay (HOSPITAL_COMMUNITY)
Admission: EM | Admit: 2018-09-07 | Discharge: 2018-10-05 | DRG: 871 | Disposition: A | Discharge status: Skilled Nursing Facility | Payer: Medicare HMO | Attending: Internal Medicine | Admitting: Internal Medicine

## 2018-09-07 ENCOUNTER — Encounter (HOSPITAL_COMMUNITY): Payer: Self-pay

## 2018-09-07 ENCOUNTER — Other Ambulatory Visit: Payer: Self-pay | Admitting: Hematology

## 2018-09-07 ENCOUNTER — Other Ambulatory Visit: Payer: Self-pay

## 2018-09-07 DIAGNOSIS — R7881 Bacteremia: Secondary | ICD-10-CM | POA: Diagnosis not present

## 2018-09-07 DIAGNOSIS — C8510 Unspecified B-cell lymphoma, unspecified site: Secondary | ICD-10-CM | POA: Diagnosis not present

## 2018-09-07 DIAGNOSIS — R531 Weakness: Secondary | ICD-10-CM | POA: Diagnosis not present

## 2018-09-07 DIAGNOSIS — C799 Secondary malignant neoplasm of unspecified site: Secondary | ICD-10-CM | POA: Diagnosis not present

## 2018-09-07 DIAGNOSIS — H1132 Conjunctival hemorrhage, left eye: Secondary | ICD-10-CM | POA: Diagnosis not present

## 2018-09-07 DIAGNOSIS — R5381 Other malaise: Secondary | ICD-10-CM | POA: Diagnosis not present

## 2018-09-07 DIAGNOSIS — R5081 Fever presenting with conditions classified elsewhere: Secondary | ICD-10-CM | POA: Diagnosis present

## 2018-09-07 DIAGNOSIS — E8779 Other fluid overload: Secondary | ICD-10-CM | POA: Diagnosis not present

## 2018-09-07 DIAGNOSIS — R6520 Severe sepsis without septic shock: Secondary | ICD-10-CM | POA: Diagnosis not present

## 2018-09-07 DIAGNOSIS — A4152 Sepsis due to Pseudomonas: Principal | ICD-10-CM | POA: Diagnosis present

## 2018-09-07 DIAGNOSIS — Z751 Person awaiting admission to adequate facility elsewhere: Secondary | ICD-10-CM

## 2018-09-07 DIAGNOSIS — Z950 Presence of cardiac pacemaker: Secondary | ICD-10-CM | POA: Diagnosis not present

## 2018-09-07 DIAGNOSIS — E46 Unspecified protein-calorie malnutrition: Secondary | ICD-10-CM | POA: Diagnosis not present

## 2018-09-07 DIAGNOSIS — D6481 Anemia due to antineoplastic chemotherapy: Secondary | ICD-10-CM | POA: Diagnosis present

## 2018-09-07 DIAGNOSIS — R079 Chest pain, unspecified: Secondary | ICD-10-CM | POA: Diagnosis not present

## 2018-09-07 DIAGNOSIS — D630 Anemia in neoplastic disease: Secondary | ICD-10-CM | POA: Diagnosis present

## 2018-09-07 DIAGNOSIS — D589 Hereditary hemolytic anemia, unspecified: Secondary | ICD-10-CM | POA: Diagnosis not present

## 2018-09-07 DIAGNOSIS — K5909 Other constipation: Secondary | ICD-10-CM

## 2018-09-07 DIAGNOSIS — T451X5A Adverse effect of antineoplastic and immunosuppressive drugs, initial encounter: Secondary | ICD-10-CM | POA: Diagnosis present

## 2018-09-07 DIAGNOSIS — D509 Iron deficiency anemia, unspecified: Secondary | ICD-10-CM | POA: Diagnosis not present

## 2018-09-07 DIAGNOSIS — J9601 Acute respiratory failure with hypoxia: Secondary | ICD-10-CM | POA: Diagnosis not present

## 2018-09-07 DIAGNOSIS — D479 Neoplasm of uncertain behavior of lymphoid, hematopoietic and related tissue, unspecified: Secondary | ICD-10-CM | POA: Diagnosis not present

## 2018-09-07 DIAGNOSIS — C8330 Diffuse large B-cell lymphoma, unspecified site: Secondary | ICD-10-CM | POA: Diagnosis not present

## 2018-09-07 DIAGNOSIS — J90 Pleural effusion, not elsewhere classified: Secondary | ICD-10-CM | POA: Diagnosis present

## 2018-09-07 DIAGNOSIS — R279 Unspecified lack of coordination: Secondary | ICD-10-CM | POA: Diagnosis not present

## 2018-09-07 DIAGNOSIS — Z743 Need for continuous supervision: Secondary | ICD-10-CM | POA: Diagnosis not present

## 2018-09-07 DIAGNOSIS — D591 Other autoimmune hemolytic anemias: Secondary | ICD-10-CM | POA: Diagnosis present

## 2018-09-07 DIAGNOSIS — M109 Gout, unspecified: Secondary | ICD-10-CM | POA: Diagnosis not present

## 2018-09-07 DIAGNOSIS — R0902 Hypoxemia: Secondary | ICD-10-CM

## 2018-09-07 DIAGNOSIS — J9600 Acute respiratory failure, unspecified whether with hypoxia or hypercapnia: Secondary | ICD-10-CM | POA: Diagnosis not present

## 2018-09-07 DIAGNOSIS — R918 Other nonspecific abnormal finding of lung field: Secondary | ICD-10-CM | POA: Diagnosis not present

## 2018-09-07 DIAGNOSIS — E440 Moderate protein-calorie malnutrition: Secondary | ICD-10-CM | POA: Diagnosis not present

## 2018-09-07 DIAGNOSIS — N390 Urinary tract infection, site not specified: Secondary | ICD-10-CM | POA: Diagnosis present

## 2018-09-07 DIAGNOSIS — R627 Adult failure to thrive: Secondary | ICD-10-CM | POA: Diagnosis not present

## 2018-09-07 DIAGNOSIS — G92 Toxic encephalopathy: Secondary | ICD-10-CM | POA: Diagnosis not present

## 2018-09-07 DIAGNOSIS — Z888 Allergy status to other drugs, medicaments and biological substances status: Secondary | ICD-10-CM

## 2018-09-07 DIAGNOSIS — C8338 Diffuse large B-cell lymphoma, lymph nodes of multiple sites: Secondary | ICD-10-CM | POA: Diagnosis not present

## 2018-09-07 DIAGNOSIS — K449 Diaphragmatic hernia without obstruction or gangrene: Secondary | ICD-10-CM | POA: Diagnosis present

## 2018-09-07 DIAGNOSIS — G9389 Other specified disorders of brain: Secondary | ICD-10-CM | POA: Diagnosis not present

## 2018-09-07 DIAGNOSIS — D5919 Other autoimmune hemolytic anemia: Secondary | ICD-10-CM

## 2018-09-07 DIAGNOSIS — Z6831 Body mass index (BMI) 31.0-31.9, adult: Secondary | ICD-10-CM | POA: Diagnosis not present

## 2018-09-07 DIAGNOSIS — T17908A Unspecified foreign body in respiratory tract, part unspecified causing other injury, initial encounter: Secondary | ICD-10-CM | POA: Diagnosis not present

## 2018-09-07 DIAGNOSIS — L89150 Pressure ulcer of sacral region, unstageable: Secondary | ICD-10-CM | POA: Diagnosis present

## 2018-09-07 DIAGNOSIS — R059 Cough, unspecified: Secondary | ICD-10-CM

## 2018-09-07 DIAGNOSIS — H0520 Unspecified exophthalmos: Secondary | ICD-10-CM | POA: Diagnosis present

## 2018-09-07 DIAGNOSIS — J9690 Respiratory failure, unspecified, unspecified whether with hypoxia or hypercapnia: Secondary | ICD-10-CM | POA: Diagnosis present

## 2018-09-07 DIAGNOSIS — D649 Anemia, unspecified: Secondary | ICD-10-CM | POA: Diagnosis not present

## 2018-09-07 DIAGNOSIS — T502X5A Adverse effect of carbonic-anhydrase inhibitors, benzothiadiazides and other diuretics, initial encounter: Secondary | ICD-10-CM | POA: Diagnosis not present

## 2018-09-07 DIAGNOSIS — D6959 Other secondary thrombocytopenia: Secondary | ICD-10-CM | POA: Diagnosis present

## 2018-09-07 DIAGNOSIS — D594 Other nonautoimmune hemolytic anemias: Secondary | ICD-10-CM | POA: Diagnosis not present

## 2018-09-07 DIAGNOSIS — D696 Thrombocytopenia, unspecified: Secondary | ICD-10-CM | POA: Diagnosis not present

## 2018-09-07 DIAGNOSIS — E669 Obesity, unspecified: Secondary | ICD-10-CM | POA: Diagnosis present

## 2018-09-07 DIAGNOSIS — Z09 Encounter for follow-up examination after completed treatment for conditions other than malignant neoplasm: Secondary | ICD-10-CM | POA: Diagnosis not present

## 2018-09-07 DIAGNOSIS — B965 Pseudomonas (aeruginosa) (mallei) (pseudomallei) as the cause of diseases classified elsewhere: Secondary | ICD-10-CM

## 2018-09-07 DIAGNOSIS — T68XXXA Hypothermia, initial encounter: Secondary | ICD-10-CM | POA: Diagnosis not present

## 2018-09-07 DIAGNOSIS — D703 Neutropenia due to infection: Secondary | ICD-10-CM | POA: Diagnosis present

## 2018-09-07 DIAGNOSIS — R0682 Tachypnea, not elsewhere classified: Secondary | ICD-10-CM

## 2018-09-07 DIAGNOSIS — R05 Cough: Secondary | ICD-10-CM

## 2018-09-07 DIAGNOSIS — C8590 Non-Hodgkin lymphoma, unspecified, unspecified site: Secondary | ICD-10-CM | POA: Diagnosis present

## 2018-09-07 DIAGNOSIS — R0789 Other chest pain: Secondary | ICD-10-CM | POA: Diagnosis not present

## 2018-09-07 DIAGNOSIS — I10 Essential (primary) hypertension: Secondary | ICD-10-CM | POA: Diagnosis not present

## 2018-09-07 DIAGNOSIS — A4181 Sepsis due to Enterococcus: Secondary | ICD-10-CM | POA: Diagnosis present

## 2018-09-07 DIAGNOSIS — C8332 Diffuse large B-cell lymphoma, intrathoracic lymph nodes: Secondary | ICD-10-CM

## 2018-09-07 DIAGNOSIS — Z87891 Personal history of nicotine dependence: Secondary | ICD-10-CM

## 2018-09-07 DIAGNOSIS — E876 Hypokalemia: Secondary | ICD-10-CM | POA: Diagnosis not present

## 2018-09-07 DIAGNOSIS — K5900 Constipation, unspecified: Secondary | ICD-10-CM | POA: Diagnosis not present

## 2018-09-07 DIAGNOSIS — T17908D Unspecified foreign body in respiratory tract, part unspecified causing other injury, subsequent encounter: Secondary | ICD-10-CM | POA: Diagnosis not present

## 2018-09-07 DIAGNOSIS — Z6841 Body Mass Index (BMI) 40.0 and over, adult: Secondary | ICD-10-CM | POA: Diagnosis not present

## 2018-09-07 DIAGNOSIS — D709 Neutropenia, unspecified: Secondary | ICD-10-CM

## 2018-09-07 DIAGNOSIS — Z803 Family history of malignant neoplasm of breast: Secondary | ICD-10-CM

## 2018-09-07 DIAGNOSIS — E8770 Fluid overload, unspecified: Secondary | ICD-10-CM | POA: Diagnosis not present

## 2018-09-07 DIAGNOSIS — A419 Sepsis, unspecified organism: Secondary | ICD-10-CM

## 2018-09-07 DIAGNOSIS — C8580 Other specified types of non-Hodgkin lymphoma, unspecified site: Secondary | ICD-10-CM | POA: Diagnosis not present

## 2018-09-07 DIAGNOSIS — E870 Hyperosmolality and hypernatremia: Secondary | ICD-10-CM | POA: Diagnosis not present

## 2018-09-07 DIAGNOSIS — L89159 Pressure ulcer of sacral region, unspecified stage: Secondary | ICD-10-CM | POA: Diagnosis not present

## 2018-09-07 DIAGNOSIS — R278 Other lack of coordination: Secondary | ICD-10-CM | POA: Diagnosis not present

## 2018-09-07 DIAGNOSIS — B359 Dermatophytosis, unspecified: Secondary | ICD-10-CM | POA: Diagnosis present

## 2018-09-07 DIAGNOSIS — R2689 Other abnormalities of gait and mobility: Secondary | ICD-10-CM | POA: Diagnosis not present

## 2018-09-07 DIAGNOSIS — I442 Atrioventricular block, complete: Secondary | ICD-10-CM | POA: Diagnosis present

## 2018-09-07 DIAGNOSIS — R0602 Shortness of breath: Secondary | ICD-10-CM | POA: Diagnosis not present

## 2018-09-07 DIAGNOSIS — Z5111 Encounter for antineoplastic chemotherapy: Secondary | ICD-10-CM | POA: Diagnosis not present

## 2018-09-07 DIAGNOSIS — Z8249 Family history of ischemic heart disease and other diseases of the circulatory system: Secondary | ICD-10-CM

## 2018-09-07 DIAGNOSIS — E785 Hyperlipidemia, unspecified: Secondary | ICD-10-CM | POA: Diagnosis present

## 2018-09-07 DIAGNOSIS — M8949 Other hypertrophic osteoarthropathy, multiple sites: Secondary | ICD-10-CM | POA: Diagnosis present

## 2018-09-07 DIAGNOSIS — Z8261 Family history of arthritis: Secondary | ICD-10-CM

## 2018-09-07 DIAGNOSIS — L8990 Pressure ulcer of unspecified site, unspecified stage: Secondary | ICD-10-CM

## 2018-09-07 DIAGNOSIS — Z79899 Other long term (current) drug therapy: Secondary | ICD-10-CM | POA: Diagnosis not present

## 2018-09-07 DIAGNOSIS — Z7401 Bed confinement status: Secondary | ICD-10-CM

## 2018-09-07 LAB — URINALYSIS, ROUTINE W REFLEX MICROSCOPIC
Bilirubin Urine: NEGATIVE
Glucose, UA: NEGATIVE mg/dL
Hgb urine dipstick: NEGATIVE
KETONES UR: NEGATIVE mg/dL
Leukocytes, UA: NEGATIVE
Nitrite: NEGATIVE
PROTEIN: NEGATIVE mg/dL
Specific Gravity, Urine: 1.019 (ref 1.005–1.030)
pH: 5 (ref 5.0–8.0)

## 2018-09-07 LAB — CBC WITH DIFFERENTIAL/PLATELET
ABS IMMATURE GRANULOCYTES: 0.23 10*3/uL — AB (ref 0.00–0.07)
Basophils Absolute: 0 10*3/uL (ref 0.0–0.1)
Basophils Relative: 0 %
Eosinophils Absolute: 0 10*3/uL (ref 0.0–0.5)
Eosinophils Relative: 0 %
HCT: 27.5 % — ABNORMAL LOW (ref 36.0–46.0)
Hemoglobin: 8.2 g/dL — ABNORMAL LOW (ref 12.0–15.0)
IMMATURE GRANULOCYTES: 2 %
Lymphocytes Relative: 3 %
Lymphs Abs: 0.4 10*3/uL — ABNORMAL LOW (ref 0.7–4.0)
MCH: 27.6 pg (ref 26.0–34.0)
MCHC: 29.8 g/dL — ABNORMAL LOW (ref 30.0–36.0)
MCV: 92.6 fL (ref 80.0–100.0)
Monocytes Absolute: 2.3 10*3/uL — ABNORMAL HIGH (ref 0.1–1.0)
Monocytes Relative: 15 %
NEUTROS PCT: 80 %
NRBC: 0 % (ref 0.0–0.2)
Neutro Abs: 11.8 10*3/uL — ABNORMAL HIGH (ref 1.7–7.7)
Platelets: 260 10*3/uL (ref 150–400)
RBC: 2.97 MIL/uL — ABNORMAL LOW (ref 3.87–5.11)
RDW: 20.6 % — ABNORMAL HIGH (ref 11.5–15.5)
WBC: 14.7 10*3/uL — ABNORMAL HIGH (ref 4.0–10.5)

## 2018-09-07 LAB — I-STAT CG4 LACTIC ACID, ED
Lactic Acid, Venous: 2.84 mmol/L (ref 0.5–1.9)
Lactic Acid, Venous: 2.32 mmol/L (ref 0.5–1.9)

## 2018-09-07 LAB — COMPREHENSIVE METABOLIC PANEL
ALT: 13 U/L (ref 0–44)
AST: 24 U/L (ref 15–41)
Albumin: 2.5 g/dL — ABNORMAL LOW (ref 3.5–5.0)
Alkaline Phosphatase: 76 U/L (ref 38–126)
Anion gap: 12 (ref 5–15)
BUN: 30 mg/dL — ABNORMAL HIGH (ref 8–23)
CO2: 25 mmol/L (ref 22–32)
Calcium: 12.5 mg/dL — ABNORMAL HIGH (ref 8.9–10.3)
Chloride: 96 mmol/L — ABNORMAL LOW (ref 98–111)
Creatinine, Ser: 0.82 mg/dL (ref 0.44–1.00)
GFR calc Af Amer: >60 mL/min (ref >60)
GFR calc non Af Amer: >60 mL/min (ref >60)
Glucose, Bld: 114 mg/dL — ABNORMAL HIGH (ref 70–99)
POTASSIUM: 4.4 mmol/L (ref 3.5–5.1)
Sodium: 133 mmol/L — ABNORMAL LOW (ref 135–145)
Total Bilirubin: 3.4 mg/dL — ABNORMAL HIGH (ref 0.3–1.2)
Total Protein: 4.9 g/dL — ABNORMAL LOW (ref 6.5–8.1)

## 2018-09-07 LAB — PROTIME-INR
INR: 1.23
Prothrombin Time: 15.4 seconds — ABNORMAL HIGH (ref 11.4–15.2)

## 2018-09-07 LAB — INFLUENZA PANEL BY PCR (TYPE A & B)
INFLAPCR: NEGATIVE
INFLBPCR: NEGATIVE

## 2018-09-07 LAB — ABO/RH: ABO/RH(D): O POS

## 2018-09-07 MED ORDER — IOPAMIDOL (ISOVUE-370) INJECTION 76%
INTRAVENOUS | Status: AC
  Filled 2018-09-07: qty 100

## 2018-09-07 MED ORDER — SODIUM CHLORIDE 0.9 % IV BOLUS (SEPSIS): 1000.0000 mL | Freq: Once | INTRAVENOUS | Status: DC

## 2018-09-07 MED ORDER — LIP MEDEX EX OINT: TOPICAL_OINTMENT | CUTANEOUS | Status: DC | PRN

## 2018-09-07 MED ORDER — SODIUM CHLORIDE 0.9 % IV BOLUS (SEPSIS)
1000.0000 mL | Freq: Once | INTRAVENOUS | Status: AC
  Administered 2018-09-07: 1000 mL via INTRAVENOUS

## 2018-09-07 MED ORDER — ONDANSETRON HCL 4 MG/2ML IJ SOLN
4.0000 mg | Freq: Four times a day (QID) | INTRAMUSCULAR | Status: DC | PRN
  Administered 2018-09-13 – 2018-09-22 (×3): 4 mg via INTRAVENOUS
  Filled 2018-09-07 (×3): qty 2

## 2018-09-07 MED ORDER — MECLIZINE HCL 25 MG PO TABS: 12.5000 mg | ORAL_TABLET | Freq: Three times a day (TID) | ORAL | Status: DC | PRN

## 2018-09-07 MED ORDER — METRONIDAZOLE IN NACL 5-0.79 MG/ML-% IV SOLN
500.0000 mg | Freq: Three times a day (TID) | INTRAVENOUS | Status: DC
  Administered 2018-09-07 – 2018-09-08 (×5): 500 mg via INTRAVENOUS
  Filled 2018-09-07 (×5): qty 100

## 2018-09-07 MED ORDER — HYDROCODONE-ACETAMINOPHEN 5-325 MG PO TABS
1.0000 | ORAL_TABLET | Freq: Four times a day (QID) | ORAL | Status: DC | PRN
  Administered 2018-09-09 – 2018-09-25 (×22): 1 via ORAL
  Filled 2018-09-07 (×24): qty 1

## 2018-09-07 MED ORDER — SODIUM CHLORIDE 0.9 % IV SOLN
2.0000 g | Freq: Once | INTRAVENOUS | Status: AC
  Administered 2018-09-07: 2 g via INTRAVENOUS
  Filled 2018-09-07: qty 2

## 2018-09-07 MED ORDER — ENOXAPARIN SODIUM 40 MG/0.4ML ~~LOC~~ SOLN
40.0000 mg | SUBCUTANEOUS | Status: DC
  Administered 2018-09-07 – 2018-09-11 (×5): 40 mg via SUBCUTANEOUS
  Filled 2018-09-07 (×5): qty 0.4

## 2018-09-07 MED ORDER — SODIUM CHLORIDE 0.9 % IV SOLN
1.0000 g | INTRAVENOUS | Status: DC
  Administered 2018-09-07 – 2018-09-11 (×5): 1 g via INTRAVENOUS
  Filled 2018-09-07 (×3): qty 10
  Filled 2018-09-07 (×2): qty 1

## 2018-09-07 MED ORDER — ACETAMINOPHEN 325 MG PO TABS
650.0000 mg | ORAL_TABLET | Freq: Four times a day (QID) | ORAL | Status: DC | PRN
  Administered 2018-09-18 – 2018-10-05 (×18): 650 mg via ORAL
  Filled 2018-09-07 (×19): qty 2

## 2018-09-07 MED ORDER — VITAMIN D3 25 MCG (1000 UNIT) PO TABS
2000.0000 [IU] | ORAL_TABLET | Freq: Every day | ORAL | Status: DC
  Administered 2018-09-08 – 2018-09-22 (×15): 2000 [IU] via ORAL
  Administered 2018-09-23: 10:00:00 via ORAL
  Administered 2018-09-24 – 2018-10-05 (×12): 2000 [IU] via ORAL
  Filled 2018-09-07 (×28): qty 2

## 2018-09-07 MED ORDER — MORPHINE SULFATE (PF) 4 MG/ML IV SOLN
4.0000 mg | Freq: Once | INTRAVENOUS | Status: AC
  Administered 2018-09-07: 4 mg via INTRAVENOUS
  Filled 2018-09-07: qty 1

## 2018-09-07 MED ORDER — ONDANSETRON HCL 4 MG PO TABS: 4.0000 mg | ORAL_TABLET | Freq: Four times a day (QID) | ORAL | Status: DC | PRN

## 2018-09-07 MED ORDER — MORPHINE SULFATE (PF) 2 MG/ML IV SOLN
2.0000 mg | INTRAVENOUS | Status: DC | PRN
  Administered 2018-09-07 – 2018-09-20 (×12): 2 mg via INTRAVENOUS
  Filled 2018-09-07 (×13): qty 1

## 2018-09-07 MED ORDER — LIP MEDEX EX OINT
TOPICAL_OINTMENT | CUTANEOUS | Status: AC
  Administered 2018-09-07: 1
  Filled 2018-09-07: qty 7

## 2018-09-07 MED ORDER — KETOTIFEN FUMARATE 0.025 % OP SOLN: 1.0000 [drp] | Freq: Two times a day (BID) | OPHTHALMIC | Status: DC

## 2018-09-07 MED ORDER — ACETAMINOPHEN 500 MG PO TABS
1000.0000 mg | ORAL_TABLET | Freq: Once | ORAL | Status: AC
  Administered 2018-09-07: 1000 mg via ORAL
  Filled 2018-09-07: qty 2

## 2018-09-07 MED ORDER — DEXTROSE-NACL 5-0.9 % IV SOLN
INTRAVENOUS | Status: DC
  Administered 2018-09-07 – 2018-09-09 (×3): via INTRAVENOUS

## 2018-09-07 MED ORDER — IOPAMIDOL (ISOVUE-370) INJECTION 76%
100.0000 mL | Freq: Once | INTRAVENOUS | Status: AC | PRN
  Administered 2018-09-07: 100 mL via INTRAVENOUS

## 2018-09-07 MED ORDER — ACETAMINOPHEN 650 MG RE SUPP: 650.0000 mg | Freq: Four times a day (QID) | RECTAL | Status: DC | PRN

## 2018-09-07 MED ORDER — SODIUM CHLORIDE 0.9 % IV SOLN
Freq: Once | INTRAVENOUS | Status: AC
  Administered 2018-09-07: 13:00:00 via INTRAVENOUS

## 2018-09-07 MED ORDER — VANCOMYCIN HCL IN DEXTROSE 1-5 GM/200ML-% IV SOLN
1000.0000 mg | Freq: Once | INTRAVENOUS | Status: AC
  Administered 2018-09-07: 1000 mg via INTRAVENOUS
  Filled 2018-09-07: qty 200

## 2018-09-07 MED ORDER — SODIUM CHLORIDE (PF) 0.9 % IJ SOLN
INTRAMUSCULAR | Status: AC
  Filled 2018-09-07: qty 50

## 2018-09-07 MED ORDER — FERROUS GLUCONATE 324 (38 FE) MG PO TABS
324.0000 mg | ORAL_TABLET | Freq: Two times a day (BID) | ORAL | Status: DC
  Administered 2018-09-08 – 2018-09-09 (×3): 324 mg via ORAL
  Filled 2018-09-07 (×5): qty 1

## 2018-09-07 NOTE — Progress Notes (Signed)
Patricia Murillo   DOB:Nov 11, 1945   XB#:[id number]   NUU#:[id number]  Hem/Onc follow up note   Subjective: I met [name] last week when she was in [facility]. She was scheduled to see me in my office today, but presented to emergency room with worsening weakness, chills, anorexia and edema.  She was febrile in the ED, lab reviewed leukocytosis, concerning for sepsis. She will be admitted to hospital for further management.   Objective:  Vitals:   09/07/18 1736 09/07/18 1858  BP:  122/75  Pulse: 88 90  Resp: 16 16  Temp:  98.4 F (36.9 C)  SpO2: 97% 97%    There is no height or weight on file to calculate BMI.  Intake/Output Summary (Last 24 hours) at 09/07/2018 2020 Last data filed at 09/07/2018 1629 Gross per 24 hour  Intake 2433.33 ml  Output -  Net 2433.33 ml     Sclerae unicteric  Oropharynx clear  (+) bulky adenopathy at left axilla   Lungs clear -- no rales or rhonchi, decreased breath sounds bilateral lung, mildly tachypneic  Heart regular rate and rhythm  Abdomen benign, soft   MSK no focal spinal tenderness, (+) severe edema of the left upper skin the, and bilateral lower extremity  Neuro nonfocal, but lethargic    CBG (last 3)  No results for input(s): GLUCAP in the last 72 hours.   Labs:  Lab Results  Component Value Date   WBC 14.7 (H) 110/06/2018   HGB 8.2 (L) 110/06/2018   HCT 27.5 (L) 110/06/2018   MCV 92.6 110/06/2018   PLT 260 110/06/2018   NEUTROABS 11.8 (H) 110/06/2018   CMP Latest Ref Rng & Units 09/07/2018 09/01/2018 08/31/2018  Glucose 70 - 99 mg/dL 114(H) 106(H) 104(H)  BUN 8 - 23 mg/dL 30(H) 12 14  Creatinine 0.44 - 1.00 mg/dL 0.82 0.90 0.87  Sodium 135 - 145 mmol/L 133(L) 132(L) 134(L)  Potassium 3.5 - 5.1 mmol/L 4.4 3.4(L) 3.7  Chloride 98 - 111 mmol/L 96(L) 99 102  CO2 22 - 32 mmol/L 25 25 25   Calcium 8.9 - 10.3 mg/dL 12.5(H) 10.4(H) 10.3  Total Protein 6.5 - 8.1 g/dL 4.9(L) - -  Total Bilirubin 0.3 - 1.2 mg/dL 3.4(H) - -  Alkaline Phos 38 -  126 U/L 76 - -  AST 15 - 41 U/L 24 - -  ALT 0 - 44 U/L 13 - -     Urine Studies No results for input(s): UHGB, CRYS in the last 72 hours.  Invalid input(s): UACOL, UAPR, USPG, UPH, UTP, UGL, UKET, UBIL, UNIT, UROB, ULEU, UEPI, UWBC, URBC, UBAC, CAST, UCOM, BILUA  Basic Metabolic Panel: Recent Labs  Lab 09/01/18 0435 09/07/18 0807  NA 132* 133*  K 3.4* 4.4  CL 99 96*  CO2 25 25  GLUCOSE 106* 114*  BUN 12 30*  CREATININE 0.90 0.82  CALCIUM 10.4* 12.5*   GFR Estimated Creatinine Clearance: 71.2 mL/min (by C-G formula based on SCr of 0.82 mg/dL). Liver Function Tests: Recent Labs  Lab 09/07/18 0807  AST 24  ALT 13  ALKPHOS 76  BILITOT 3.4*  PROT 4.9*  ALBUMIN 2.5*   No results for input(s): LIPASE, AMYLASE in the last 168 hours. No results for input(s): AMMONIA in the last 168 hours. Coagulation profile Recent Labs  Lab 09/07/18 0807  INR 1.23    CBC: Recent Labs  Lab 09/01/18 0435 09/07/18 0807  WBC 10.5 14.7*  NEUTROABS  --  11.8*  HGB 9.1* 8.2*  HCT 29.4* 27.5*  MCV 84.5 92.6  PLT 197 260   Cardiac Enzymes: No results for input(s): CKTOTAL, CKMB, CKMBINDEX, TROPONINI in the last 168 hours. BNP: Invalid input(s): POCBNP CBG: No results for input(s): GLUCAP in the last 168 hours. D-Dimer No results for input(s): DDIMER in the last 72 hours. Hgb A1c No results for input(s): HGBA1C in the last 72 hours. Lipid Profile No results for input(s): CHOL, HDL, LDLCALC, TRIG, CHOLHDL, LDLDIRECT in the last 72 hours. Thyroid function studies No results for input(s): TSH, T4TOTAL, T3FREE, THYROIDAB in the last 72 hours.  Invalid input(s): FREET3 Anemia work up No results for input(s): VITAMINB12, FOLATE, FERRITIN, TIBC, IRON, RETICCTPCT in the last 72 hours. Microbiology Recent Results (from the past 240 hour(s))  MRSA PCR Screening     Status: None   Collection Time: 08/30/18  1:06 AM  Result Value Ref Range Status   MRSA by PCR NEGATIVE NEGATIVE  Final    Comment:        The GeneXpert MRSA Assay (FDA approved for NASAL specimens only), is one component of a comprehensive MRSA colonization surveillance program. It is not intended to diagnose MRSA infection nor to guide or monitor treatment for MRSA infections. Performed at [facility], [place] [address] [place], [place] [address]       Studies:  Dg Chest 2 View  Result Date: 09/07/2018 CLINICAL DATA:  Increasing shortness of breath. Decreased oxygen saturation since yesterday. EXAM: CHEST - 2 VIEW COMPARISON:  Single-view of the chest and CT chest 08/29/2018. FINDINGS: There is cardiomegaly without edema. Moderate left pleural effusion and basilar atelectasis are seen. Pulmonary nodules in the left upper lobe and along the periphery of the left lung are identified as seen on prior CT. Small right pulmonary nodule seen on CT are difficult to visualize on this examination. Large hiatal hernia is noted. No pneumothorax. IMPRESSION: No change in a moderate left pleural effusion and left basilar atelectasis. Pulmonary nodules. Cardiomegaly without edema. Hiatal hernia. Electronically Signed   By: Inge Rise M.D.   On: 103-23-202019 09:02   Ct Head Wo Contrast  Result Date: 09/07/2018 CLINICAL DATA:  Sepsis. Recent diagnosis of suspected metastatic disease in chest abdomen and pelvis on CT studies. New left sided proptosis and left upper and lower extremity weakness. No reported injury. EXAM: CT HEAD WITHOUT CONTRAST TECHNIQUE: Contiguous axial images were obtained from the base of the skull through the vertex without intravenous contrast. COMPARISON:  07/15/2017 head CT. FINDINGS: Brain: No evidence of parenchymal hemorrhage or extra-axial fluid collection. No mass lesion, mass effect, or midline shift. No CT evidence of acute infarction. Nonspecific mild subcortical and periventricular white matter hypodensity, most in keeping with chronic small vessel ischemic change. Cerebral  volume is age appropriate. No ventriculomegaly. Vascular: No acute abnormality. Skull: No evidence of calvarial fracture. Sinuses/Orbits: No fluid levels. Mild mucoperiosteal thickening in the maxillary sinuses bilaterally. Other:  The mastoid air cells are unopacified. IMPRESSION: 1.  No evidence of acute intracranial abnormality. 2. Mild chronic small vessel ischemic changes in the cerebral white matter. 3. Mild chronic appearing paranasal sinusitis. Electronically Signed   By: Ilona Sorrel M.D.   On: 103-23-202019 12:06   Ct Angio Chest Pe W/cm &/or Wo Cm  Result Date: 09/07/2018 CLINICAL DATA:  Shortness of breath and weakness. 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 EXAM: CT ANGIOGRAPHY CHEST WITH CONTRAST TECHNIQUE: Multidetector CT imaging of the chest was performed using the standard  protocol during bolus administration of intravenous contrast. Multiplanar CT image reconstructions and MIPs were obtained to evaluate the vascular anatomy. CONTRAST:  168m ISOVUE-370 IOPAMIDOL (ISOVUE-370) INJECTION 76% COMPARISON:  CTA of the chest on 08/29/2018 FINDINGS: Cardiovascular: The pulmonary arteries are adequately opacified. There is no evidence of pulmonary embolism. The thoracic aorta is normal in caliber and demonstrates no evidence of aneurysm or dissection. Proximal great vessels are normally patent. The heart size is stable and at the upper limits of normal. Pacemaker shows stable positioning. No pericardial fluid identified. Mediastinum/Nodes: Massive confluent lymphadenopathy of the left axilla extending into the left subpectoral region and encasing the region of the brachial plexus, left axillary vessels and left subclavian vessels appears progressively enlarged since the prior CTA. Index lateral subpectoral lymph node measured previously at 2.8 cm in short axis now measures approximately 4.1 cm. More inferior left axillary node measured at 2.5 cm in short axis  previously now measures 3.1 cm. Confluent lymph node mass encasement of vessels does not cause arterial occlusion but may be causing significant mass effect on venous structures. The veins are not opacified on the study to determine whether any venous thrombus is present. Associated right hilar and mediastinal lymphadenopathy also appears slightly more prominent compared to the prior study. Largest area right hilar lymphadenopathy measures approximately 2.2 cm. Stable large hiatal hernia. Lungs/Pleura: There also is progressive pulmonary involvement with lymphoma with definite progression since the prior CTA. All of the multiple bilateral pulmonary nodules show enlargement since the prior study. Index mass in the subpleural left lower lobe now measures approximately 2.2 x 3.7 cm (previously 1.7 x 3.3 cm). All other lesions also show enlargement. New small right pleural effusion. Small to moderate left pleural effusion appears stable since the prior study. Upper Abdomen: Stable splenomegaly. Musculoskeletal: No chest wall abnormality. No acute or significant osseous findings. Review of the MIP images confirms the above findings. IMPRESSION: 1. No evidence of pulmonary embolism. 2. Progression of lymphoma since the prior CTA on 11/25 with enlargement of left axillary, subpectoral and chest wall lymph node masses with encasement of left subclavian and axillary vascular structures. Enlargement of right hilar and mediastinal lymph nodes since the prior study. Pulmonary involvement also shows significant progression with enlargement of all of the bilateral pulmonary nodule seen previously. 3. Normal arterial patency demonstrated of the left subclavian and axillary arteries. Venous patency cannot be determined on the CTA study and the patient would be at high risk of left upper extremity DVT given the large bulky lymphadenopathy present. 4. New small right pleural effusion. Stable small to moderate left pleural effusion.  5. Stable splenomegaly. Electronically Signed   By: GAletta EdouardM.D.   On: 1Oct 30, 202019 12:29    Assessment: 72y.o. with newly diagnosed high-grade B-cell lymphoma, recently discharged from hospital after biopsy, presented to the emergency room with progressive weakness, dyspnea, chills, anorexia, and extremity edema.  1. Sepsis, with fever, chill, possible pneumonia  2.  Acute hypoxic respite failure, secondary to lymphoma in lungs, versus infections 3.  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 Anemia, secondary to lymphoma and acute illness, rule out hemolysis   5.  Third-degree AV block, status post pacemaker 6. Hypercalcemia secondary to malignancy  7.  Hyperbilirubinemia, secondary to sepsis, rule out hemolysis  8. Severe arm and leg edema, secondary to severe adenopathy and hypoalbuminemia 9. Deconditioning   Plan:  -I reviewed her left axillary lymph node  biopsy from last week with pt and his family members in detail, which showed high-grade B-cell lymphoma.  I spoke with pathologist yesterday, likely diffuse large B-cell lymphoma, not Burkitt lymphoma, FISH has been ordered to rule out double hit or triple hit high-grade lymphoma.  -She has multiple bilateral lung lesions, likely related to her lymphoma, disease stage IV disease, I do not recommend bone marrow biopsy since this will not change her staging and management -We discussed the prognosis, the aggressive nature of high-grade lymphoma. -Due to the advanced to stage and high risk disease based on the IPI, her estimated cure rate is about 40%, with 5-year survival around 25%. -I discussed treatment options, which is systemic chemotherapy. I recommend R-CHOP.  Benefit and risks of chemotherapy discussed with patient, especially cytopenias, risk of infections, cardiomyopathy, etc.  Patient and her family member agree to proceed.  -due to the possible  sepsis, I will wait for 1-2 days before starting chemo. If cultures remain to be negative, and she clinically improves by Friday morning, I plan to start prednisone and Rituxan on Friday 12/6 and CHOP on Saturday 12/7 -the goal of therapy is curative  -I will check with his cardiologist before chemo, due to the cardiotoxicity from Adriamycin.  She had echocardiogram on August 31, 2018, which showed normal EF -I will f/u closely   I spent a total of 65 mins for the visit today, >50% of face-to-face counseling.  Truitt Merle, MD 09/07/2018  8:20 PM

## 2018-09-07 NOTE — ED Triage Notes (Addendum)
Transported by [facility] from home-- [facility] and onset of weakness that started 24 hrs ago. Recently discharged from [facility] for same. Left leg edema and left arm swelling. Diagnosed with lung CA last Thursday per EMS.

## 2018-09-07 NOTE — ED Notes (Signed)
Bed: AP70 Expected date:  Expected time:  Means of arrival:  Comments: EMS/cancer/swelling

## 2018-09-07 NOTE — Progress Notes (Signed)
ED TO INPATIENT HANDOFF REPORT  Name/Age/Gender Patricia Murillo 72 y.o. female  Code Status Code Status History    Date Active Date Inactive Code Status Order ID Comments User Context   08/29/2018 1206 09/01/2018 1622 DNR [id number]  Karmen Bongo, MD ED   04/18/2018 0951 04/19/2018 1525 Full Code [id number]  Baldwin Jamaica, PA-C ED    Questions for Most Recent Historical Code Status (Order [id number])    Question Answer Comment   In the event of cardiac or respiratory ARREST Do not call a "code blue"    In the event of cardiac or respiratory ARREST Do not perform Intubation, CPR, defibrillation or ACLS    In the event of cardiac or respiratory ARREST Use medication by any route, position, wound care, and other measures to relive pain and suffering. May use oxygen, suction and manual treatment of airway obstruction as needed for comfort.         Advance Directive Documentation     Most Recent Value  Type of Advance Directive  Healthcare Power of Attorney  Pre-existing out of facility DNR order (yellow form or pink MOST form)  -  "MOST" Form in Place?  -      Home/SNF/Other Home  Chief Complaint SOB  Level of Care/Admitting Diagnosis ED Disposition    ED Disposition Condition [place]: [facility] [[id number]]  Level of Care: Med-Surg [16]  Diagnosis: Lymphoma [facility]) [[id number]]  Admitting Physician: Tawni Millers [[id number]]  Attending Physician: Tawni Millers [[id number]]  Estimated length of stay: 3 - 4 days  Certification:: I certify this patient will need inpatient services for at least 2 midnights  PT Class (Do Not Modify): Inpatient [101]  PT Acc Code (Do Not Modify): Private [1]       Medical History Past Medical History:  Diagnosis Date  . Arthritis   . Hiatal hernia   . History of chicken pox   . Hyperlipidemia   . Pacemaker     Allergies No Known Allergies  IV Location/Drains/Wounds Patient  Lines/Drains/Airways Status   Active Line/Drains/Airways    Name:   Placement date:   Placement time:   Site:   Days:   Peripheral IV 09/07/18 Right Forearm   09/07/18    0756    Forearm   less than 1   Peripheral IV 09/07/18 Right Antecubital   09/07/18    1032    Antecubital   less than 1   External Urinary Catheter   09/07/18    0811    -   less than 1   Incision (Closed) 08/30/18 Axilla Left   08/30/18    1527     8   Wound / Incision (Open or Dehisced) 04/18/18 Incision - Open Chest Right;Upper   04/18/18    1800    Chest   142   Wound / Incision (Open or Dehisced) 04/18/18 Incision - Open Chest Left;Upper   04/18/18    1800    Chest   142          Labs/Imaging Results for orders placed or performed during the hospital encounter of 09/07/18 (from the past 48 hour(s))  Comprehensive metabolic panel     Status: Abnormal   Collection Time: 09/07/18  8:07 AM  Result Value Ref Range   Sodium 133 (L) 135 - 145 mmol/L   Potassium 4.4 3.5 - 5.1 mmol/L   Chloride 96 (L) 98 - 111 mmol/L  CO2 25 22 - 32 mmol/L   Glucose, Bld 114 (H) 70 - 99 mg/dL   BUN 30 (H) 8 - 23 mg/dL   Creatinine, Ser 0.82 0.44 - 1.00 mg/dL   Calcium 12.5 (H) 8.9 - 10.3 mg/dL   Total Protein 4.9 (L) 6.5 - 8.1 g/dL   Albumin 2.5 (L) 3.5 - 5.0 g/dL   AST 24 15 - 41 U/L   ALT 13 0 - 44 U/L   Alkaline Phosphatase 76 38 - 126 U/L   Total Bilirubin 3.4 (H) 0.3 - 1.2 mg/dL   GFR calc non Af Amer >60 >60 mL/min   GFR calc Af Amer >60 >60 mL/min   Anion gap 12 5 - 15    Comment: Performed at [facility], [place] [address], [place], [place] [address]  CBC with Differential     Status: Abnormal   Collection Time: 09/07/18  8:07 AM  Result Value Ref Range   WBC 14.7 (H) 4.0 - 10.5 K/uL   RBC 2.97 (L) 3.87 - 5.11 MIL/uL   Hemoglobin 8.2 (L) 12.0 - 15.0 g/dL   HCT 27.5 (L) 36.0 - 46.0 %   MCV 92.6 80.0 - 100.0 fL   MCH 27.6 26.0 - 34.0 pg   MCHC 29.8 (L) 30.0 - 36.0 g/dL   RDW 20.6 (H) 11.5 - 15.5 %    Platelets 260 150 - 400 K/uL   nRBC 0.0 0.0 - 0.2 %   Neutrophils Relative % 80 %   Neutro Abs 11.8 (H) 1.7 - 7.7 K/uL   Lymphocytes Relative 3 %   Lymphs Abs 0.4 (L) 0.7 - 4.0 K/uL   Monocytes Relative 15 %   Monocytes Absolute 2.3 (H) 0.1 - 1.0 K/uL   Eosinophils Relative 0 %   Eosinophils Absolute 0.0 0.0 - 0.5 K/uL   Basophils Relative 0 %   Basophils Absolute 0.0 0.0 - 0.1 K/uL   Immature Granulocytes 2 %   Abs Immature Granulocytes 0.23 (H) 0.00 - 0.07 K/uL    Comment: Performed at [facility], [place] [address], [place], [place] [address]  Protime-INR     Status: Abnormal   Collection Time: 09/07/18  8:07 AM  Result Value Ref Range   Prothrombin Time 15.4 (H) 11.4 - 15.2 seconds   INR 1.23     Comment: Performed at [facility] [place] [address] [place], [place] [address]  Urinalysis, Routine w reflex microscopic     Status: Abnormal   Collection Time: 09/07/18  8:07 AM  Result Value Ref Range   Color, Urine AMBER (A) YELLOW    Comment: BIOCHEMICALS MAY BE AFFECTED BY COLOR   APPearance CLEAR CLEAR   Specific Gravity, Urine 1.019 1.005 - 1.030   pH 5.0 5.0 - 8.0   Glucose, UA NEGATIVE NEGATIVE mg/dL   Hgb urine dipstick NEGATIVE NEGATIVE   Bilirubin Urine NEGATIVE NEGATIVE   Ketones, ur NEGATIVE NEGATIVE mg/dL   Protein, ur NEGATIVE NEGATIVE mg/dL   Nitrite NEGATIVE NEGATIVE   Leukocytes, UA NEGATIVE NEGATIVE    Comment: Performed at [facility], [place] [address] [place], [place] [address]  I-Stat CG4 Lactic Acid, ED     Status: Abnormal   Collection Time: 09/07/18  8:17 AM  Result Value Ref Range   Lactic Acid, Venous 2.84 (HH) 0.5 - 1.9 mmol/L   Comment NOTIFIED PHYSICIAN   Type and screen [place]     Status: None  Collection Time: 09/07/18  8:20 AM  Result Value Ref Range   ABO/RH(D) O POS    Antibody Screen NEG    Sample Expiration      09/10/2018 Performed at [facility], [place] [address] [place], [place] [address]   Influenza panel by PCR (type A & B)     Status: None   Collection Time: 09/07/18 10:21 AM  Result Value Ref Range   Influenza A By PCR NEGATIVE NEGATIVE   Influenza B By PCR NEGATIVE NEGATIVE    Comment: (NOTE) The Xpert Xpress Flu assay is intended as an aid in the diagnosis of  influenza and should not be used as a sole basis for treatment.  This  assay is FDA approved for nasopharyngeal swab specimens only. Nasal  washings and aspirates are unacceptable for Xpert Xpress Flu testing. Performed at [facility], [place] [address], [place], [place] [address]   I-Stat CG4 Lactic Acid, ED     Status: Abnormal   Collection Time: 09/07/18 10:56 AM  Result Value Ref Range   Lactic Acid, Venous 2.32 (HH) 0.5 - 1.9 mmol/L   Comment NOTIFIED PHYSICIAN   ABO/Rh     Status: None   Collection Time: 09/07/18 12:30 PM  Result Value Ref Range   ABO/RH(D)      O POS Performed at [facility], [place] [address] [place], [place] [address]    Dg Chest 2 View  Result Date: 09/07/2018 CLINICAL DATA:  Increasing shortness of breath. Decreased oxygen saturation since yesterday. EXAM: CHEST - 2 VIEW COMPARISON:  Single-view of the chest and CT chest 08/29/2018. FINDINGS: There is cardiomegaly without edema. Moderate left pleural effusion and basilar atelectasis are seen. Pulmonary nodules in the left upper lobe and along the periphery of the left lung are identified as seen on prior CT. Small right pulmonary nodule seen on CT are difficult to visualize on this examination. Large hiatal hernia is noted. No pneumothorax. IMPRESSION: No change in a moderate left pleural effusion and left basilar atelectasis. Pulmonary nodules. Cardiomegaly without edema. Hiatal hernia. Electronically Signed   By: Inge Rise M.D.   On: 103-17-202019 09:02   Ct Head Wo Contrast  Result Date: 09/07/2018 CLINICAL DATA:  Sepsis.  Recent diagnosis of suspected metastatic disease in chest abdomen and pelvis on CT studies. New left sided proptosis and left upper and lower extremity weakness. No reported injury. EXAM: CT HEAD WITHOUT CONTRAST TECHNIQUE: Contiguous axial images were obtained from the base of the skull through the vertex without intravenous contrast. COMPARISON:  07/15/2017 head CT. FINDINGS: Brain: No evidence of parenchymal hemorrhage or extra-axial fluid collection. No mass lesion, mass effect, or midline shift. No CT evidence of acute infarction. Nonspecific mild subcortical and periventricular white matter hypodensity, most in keeping with chronic small vessel ischemic change. Cerebral volume is age appropriate. No ventriculomegaly. Vascular: No acute abnormality. Skull: No evidence of calvarial fracture. Sinuses/Orbits: No fluid levels. Mild mucoperiosteal thickening in the maxillary sinuses bilaterally. Other:  The mastoid air cells are unopacified. IMPRESSION: 1.  No evidence of acute intracranial abnormality. 2. Mild chronic small vessel ischemic changes in the cerebral white matter. 3. Mild chronic appearing paranasal sinusitis. Electronically Signed   By: Ilona Sorrel M.D.   On: 103-17-202019 12:06   Ct Angio Chest Pe W/cm &/or Wo Cm  Result Date: 09/07/2018 CLINICAL DATA:  Shortness of breath and weakness. 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 EXAM:  CT ANGIOGRAPHY CHEST WITH CONTRAST TECHNIQUE: Multidetector CT imaging of the chest was performed using the standard protocol during bolus administration of intravenous contrast. Multiplanar CT image reconstructions and MIPs were obtained to evaluate the vascular anatomy. CONTRAST:  150mL ISOVUE-370 IOPAMIDOL (ISOVUE-370) INJECTION 76% COMPARISON:  CTA of the chest on 08/29/2018 FINDINGS: Cardiovascular: The pulmonary arteries are adequately opacified. There is no evidence of pulmonary embolism. The thoracic aorta is normal in  caliber and demonstrates no evidence of aneurysm or dissection. Proximal great vessels are normally patent. The heart size is stable and at the upper limits of normal. Pacemaker shows stable positioning. No pericardial fluid identified. Mediastinum/Nodes: Massive confluent lymphadenopathy of the left axilla extending into the left subpectoral region and encasing the region of the brachial plexus, left axillary vessels and left subclavian vessels appears progressively enlarged since the prior CTA. Index lateral subpectoral lymph node measured previously at 2.8 cm in short axis now measures approximately 4.1 cm. More inferior left axillary node measured at 2.5 cm in short axis previously now measures 3.1 cm. Confluent lymph node mass encasement of vessels does not cause arterial occlusion but may be causing significant mass effect on venous structures. The veins are not opacified on the study to determine whether any venous thrombus is present. Associated right hilar and mediastinal lymphadenopathy also appears slightly more prominent compared to the prior study. Largest area right hilar lymphadenopathy measures approximately 2.2 cm. Stable large hiatal hernia. Lungs/Pleura: There also is progressive pulmonary involvement with lymphoma with definite progression since the prior CTA. All of the multiple bilateral pulmonary nodules show enlargement since the prior study. Index mass in the subpleural left lower lobe now measures approximately 2.2 x 3.7 cm (previously 1.7 x 3.3 cm). All other lesions also show enlargement. New small right pleural effusion. Small to moderate left pleural effusion appears stable since the prior study. Upper Abdomen: Stable splenomegaly. Musculoskeletal: No chest wall abnormality. No acute or significant osseous findings. Review of the MIP images confirms the above findings. IMPRESSION: 1. No evidence of pulmonary embolism. 2. Progression of lymphoma since the prior CTA on 11/25 with  enlargement of left axillary, subpectoral and chest wall lymph node masses with encasement of left subclavian and axillary vascular structures. Enlargement of right hilar and mediastinal lymph nodes since the prior study. Pulmonary involvement also shows significant progression with enlargement of all of the bilateral pulmonary nodule seen previously. 3. Normal arterial patency demonstrated of the left subclavian and axillary arteries. Venous patency cannot be determined on the CTA study and the patient would be at high risk of left upper extremity DVT given the large bulky lymphadenopathy present. 4. New small right pleural effusion. Stable small to moderate left pleural effusion. 5. Stable splenomegaly. Electronically Signed   By: Aletta Edouard M.D.   On: 107/29/202019 12:29    Pending Labs Unresulted Labs (From admission, onward)    Start     Ordered   09/07/18 2725  Urine culture  ONCE - STAT,   STAT     09/07/18 0821   09/07/18 0807  Culture, blood (Routine x 2)  BLOOD CULTURE X 2,   STAT     09/07/18 0806   Signed and Held  CBC  (enoxaparin (LOVENOX)    CrCl >/= 30 ml/min)  Once,   R    Comments:  Baseline for enoxaparin therapy IF NOT ALREADY DRAWN.  Notify MD if PLT < 100 K.    Signed and Held   Signed and Held  Creatinine, serum  (enoxaparin (LOVENOX)    CrCl >/= 30 ml/min)  Once,   R    Comments:  Baseline for enoxaparin therapy IF NOT ALREADY DRAWN.    Signed and Held   Signed and Held  Creatinine, serum  (enoxaparin (LOVENOX)    CrCl >/= 30 ml/min)  Weekly,   R    Comments:  while on enoxaparin therapy    Signed and Held   Signed and Held  Basic metabolic panel  Tomorrow morning,   R     Signed and Held   Signed and Held  CBC  Tomorrow morning,   R     Signed and Held          Vitals/Pain Today's Vitals   09/07/18 1646 09/07/18 1647 09/07/18 1700 09/07/18 1730  BP:  (!) 115/54 (!) 114/58 118/60  Pulse: 86  88 87  Resp: (!) 22  (!) 30 (!) 6  Temp:      TempSrc:       SpO2: 97%  96% 96%  PainSc:        Isolation Precautions Droplet precaution  Medications Medications  metroNIDAZOLE (FLAGYL) IVPB 500 mg (500 mg Intravenous New Bag/Given 09/07/18 1651)  iopamidol (ISOVUE-370) 76 % injection (has no administration in time range)  sodium chloride (PF) 0.9 % injection (has no administration in time range)  morphine 2 MG/ML injection 2 mg (2 mg Intravenous Given 09/07/18 1425)  sodium chloride 0.9 % bolus 1,000 mL (0 mLs Intravenous Stopped 09/07/18 0910)    And  sodium chloride 0.9 % bolus 1,000 mL (0 mLs Intravenous Stopped 09/07/18 1629)  ceFEPIme (MAXIPIME) 2 g in sodium chloride 0.9 % 100 mL IVPB (0 g Intravenous Stopped 09/07/18 0912)  vancomycin (VANCOCIN) IVPB 1000 mg/200 mL premix (0 mg Intravenous Stopped 09/07/18 1157)  acetaminophen (TYLENOL) tablet 1,000 mg (1,000 mg Oral Given 09/07/18 0840)  iopamidol (ISOVUE-370) 76 % injection 100 mL (100 mLs Intravenous Contrast Given 09/07/18 1130)  morphine 4 MG/ML injection 4 mg (4 mg Intravenous Given 09/07/18 1216)  0.9 %  sodium chloride infusion ( Intravenous New Bag/Given 09/07/18 1306)    Mobility walks with assist

## 2018-09-07 NOTE — Progress Notes (Signed)
A consult was received from an ED physician for vancomycin and cefepime per pharmacy dosing.  The patient's profile has been reviewed for ht/wt/allergies/indication/available labs.    A one time order has been placed for vancomycin 1000 mg IV and cefepime 2gm IV x1.  Further antibiotics/pharmacy consults should be ordered by admitting physician if indicated.                       Thank you, Lynelle Doctor 09/07/2018  8:27 AM

## 2018-09-07 NOTE — ED Notes (Signed)
Pa Maczis notified of patient's lactic acid result of 2.84.

## 2018-09-07 NOTE — ED Notes (Signed)
PA Maczis notified of patient's lactic result of 2.32.

## 2018-09-07 NOTE — Progress Notes (Signed)
START ON PATHWAY REGIMEN - Lymphoma and CLL     A cycle is every 21 days:     Prednisone      Rituximab      Cyclophosphamide      Doxorubicin      Vincristine   **Always confirm dose/schedule in your pharmacy ordering system**  Patient Characteristics: Diffuse Large B-Cell Lymphoma, First Line, Stage III and IV Disease Type: Not Applicable Disease Type: Diffuse Large B-Cell Lymphoma Disease Type: Not Applicable Line of therapy: First Line [place] Stage: IV Intent of Therapy: Curative Intent, Discussed with Patient 

## 2018-09-07 NOTE — H&P (Addendum)
History and Physical    Patricia Murillo DPO:[id number] DOB: 1946/10/01 DOA: 09/07/2018  PCP: Patient, No Pcp Per   Patient coming from: Home   Chief Complaint: Dyspnea and generalized weakness.   HPI: Patricia Murillo is a 72 y.o. 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  During her hospitalization she underwent left axillary lymph node biopsy.  She was discharged home in a stable condition, but apparently she has been developing worsening weakness, generalized, worse with exertion, no improving factors, associated with malaise, chills and poor appetite.  Over the last 24 hours her symptoms have been more severe to the point where she has difficulty standing. She has persistent left upper extremity edema which has been painful, sharp in nature up to 10 out of 10 intensity.  Her weakness has led to dyspnea which has been severe in intensity, and triggered her emergency room visit today.   ED Course: Patient was found ill looking appearing, febrile, positive leukocytosis, concern for systemic infection, referred for admission for evaluation.  Review of Systems:  1. General: No fevers but chills, malaise malaise and weakness. 2. ENT: No runny nose or sore throat, no hearing disturbances 3. Pulmonary: Positive dyspnea, positive productive cough, with no wheezing, or hemoptysis 4. Cardiovascular: No angina, claudication, worsening 4 extremity edema, predominantly left upper extremity. No pnd or orthopnea 5. Gastrointestinal: No nausea or vomiting, no diarrhea or constipation 6. Hematology: No easy bruisability or frequent infections 7. Urology: No dysuria, hematuria or increased urinary frequency 8. Dermatology: dark/ brown round lesion in the tip of the nose.  9. Neurology: No seizures or paresthesias 10. Musculoskeletal: No joint pain or  deformities  Past Medical History:  Diagnosis Date  . Arthritis   . Hiatal hernia   . History of chicken pox   . Hyperlipidemia   . Pacemaker     Past Surgical History:  Procedure Laterality Date  . ANKLE SURGERY    . PACEMAKER IMPLANT N/A 04/18/2018   Procedure: PACEMAKER IMPLANT;  Surgeon: Evans Lance, MD;  Location: [facility];  Service: Cardiovascular;  Laterality: N/A;     reports that she quit smoking about 16 years ago. She has never used smokeless tobacco. She reports that she does not drink alcohol or use drugs.  No Known Allergies  Family History  Problem Relation Age of Onset  . Hypertension Mother   . Arthritis Mother   . Arthritis Father   . Cancer Sister        Breast Cancer  . Heart disease Paternal Uncle      Prior to Admission medications   Medication Sig Start Date End Date Taking? Authorizing Provider  acetaminophen (TYLENOL) 650 MG CR tablet Take 1,300 mg by mouth every 8 (eight) hours as needed for pain.   Yes [provider]  azelastine (OPTIVAR) 0.05 % ophthalmic solution Place 1 drop into both eyes 2 (two) times daily. Patient taking differently: Place 1 drop into both eyes 2 (two) times daily as needed (vertigo).  06/25/17  Yes Golden Circle, FNP  ferrous gluconate (FERGON) 324 MG tablet Take 1 tablet (324 mg total) by mouth 2 (two) times daily with a meal. 09/01/18  Yes Gherghe, Vella Redhead, MD  HYDROcodone-acetaminophen (NORCO/VICODIN) 5-325 MG tablet Take 1 tablet by mouth every 6 (six) hours as needed for moderate pain.   Yes [provider]  meclizine (ANTIVERT) 12.5 MG tablet Take 1 tablet (12.5 mg total) by mouth 3 (three) times daily as needed for dizziness. 07/15/17  Yes Mesner, Corene Cornea, MD  Vitamin D, Cholecalciferol, 50 MCG (2000 UT) CAPS Take 2,000 Units by mouth daily.   Yes [provider]    Physical Exam: Vitals:   09/07/18 1000 09/07/18 1216 09/07/18 1222 09/07/18 1223  BP: (!) 136/59  (!)  123/59 (!) 123/59  Pulse: 89   82  Resp: (!) 23   (!) 29  Temp:  (!) 97.5 F (36.4 C)    TempSrc:  Oral    SpO2: 97%   99%    Vitals:   09/07/18 1000 09/07/18 1216 09/07/18 1222 09/07/18 1223  BP: (!) 136/59  (!) 123/59 (!) 123/59  Pulse: 89   82  Resp: (!) 23   (!) 29  Temp:  (!) 97.5 F (36.4 C)    TempSrc:  Oral    SpO2: 97%   99%   General: deconditioned and ill looking appearing  Neurology: Awake and alert, non focal Head and Neck. Head normocephalic. Neck supple with no adenopathy or thyromegaly.   E ENT: posiitve pallor, no icterus, oral mucosa dry Cardiovascular: No JVD. S1-S2 present, rhythmic, no gallops, rubs, or murmurs. Positive +++ non pitting lower extremity edema. Pulmonary: decreased breath sounds bilaterally, decreased air movement, with no wheezing, rhonchi or rales. Gastrointestinal. Abdomen protuberant with no organomegaly, non tender, no rebound or guarding Skin. No rashes Musculoskeletal: left upper extremity edema, non pitting ++++/ positive mass left subclavian region.     Labs on Admission: I have personally reviewed following labs and imaging studies  CBC: Recent Labs  Lab 09/01/18 0435 09/07/18 0807  WBC 10.5 14.7*  NEUTROABS  --  11.8*  HGB 9.1* 8.2*  HCT 29.4* 27.5*  MCV 84.5 92.6  PLT 197 681   Basic Metabolic Panel: Recent Labs  Lab 09/01/18 0435 09/07/18 0807  NA 132* 133*  K 3.4* 4.4  CL 99 96*  CO2 25 25  GLUCOSE 106* 114*  BUN 12 30*  CREATININE 0.90 0.82  CALCIUM 10.4* 12.5*   GFR: Estimated Creatinine Clearance: 71.2 mL/min (by C-G formula based on SCr of 0.82 mg/dL). Liver Function Tests: Recent Labs  Lab 09/07/18 0807  AST 24  ALT 13  ALKPHOS 76  BILITOT 3.4*  PROT 4.9*  ALBUMIN 2.5*   No results for input(s): LIPASE, AMYLASE in the last 168 hours. No results for input(s): AMMONIA in the last 168 hours. Coagulation Profile: Recent Labs  Lab 09/07/18 0807  INR 1.23   Cardiac Enzymes: No results for  input(s): CKTOTAL, CKMB, CKMBINDEX, TROPONINI in the last 168 hours. BNP (last 3 results) No results for input(s): PROBNP in the last 8760 hours. HbA1C: No results for input(s): HGBA1C in the last 72 hours. CBG: No results for input(s): GLUCAP in the last 168 hours. Lipid Profile: No results for input(s): CHOL, HDL, LDLCALC, TRIG, CHOLHDL, LDLDIRECT in the last 72 hours. Thyroid Function Tests: No results for input(s): TSH, T4TOTAL, FREET4, T3FREE, THYROIDAB in the last 72 hours. Anemia Panel: No results for input(s): VITAMINB12, FOLATE, FERRITIN, TIBC, IRON, RETICCTPCT in the last 72 hours. Urine analysis:    Component Value Date/Time   COLORURINE AMBER (A) 1Oct 12, 202019 0807   APPEARANCEUR CLEAR 1Oct 12, 202019 0807   LABSPEC 1.019 1Oct 12, 202019 0807   PHURINE 5.0 1Oct 12, 202019 0807   GLUCOSEU NEGATIVE 1Oct 12, 202019 0807   HGBUR NEGATIVE 1Oct 12, 202019 0807   BILIRUBINUR NEGATIVE 1Oct 12, 202019 1572  KETONESUR NEGATIVE 119-Jul-202019 0807   PROTEINUR NEGATIVE 119-Jul-202019 0807   NITRITE NEGATIVE 119-Jul-202019 0807   LEUKOCYTESUR NEGATIVE 119-Jul-202019 6644    Radiological Exams on Admission: Dg Chest 2 View  Result Date: 09/07/2018 CLINICAL DATA:  Increasing shortness of breath. Decreased oxygen saturation since yesterday. EXAM: CHEST - 2 VIEW COMPARISON:  Single-view of the chest and CT chest 08/29/2018. FINDINGS: There is cardiomegaly without edema. Moderate left pleural effusion and basilar atelectasis are seen. Pulmonary nodules in the left upper lobe and along the periphery of the left lung are identified as seen on prior CT. Small right pulmonary nodule seen on CT are difficult to visualize on this examination. Large hiatal hernia is noted. No pneumothorax. IMPRESSION: No change in a moderate left pleural effusion and left basilar atelectasis. Pulmonary nodules. Cardiomegaly without edema. Hiatal hernia. Electronically Signed   By: Inge Rise M.D.   On: 119-Jul-202019 09:02   Ct Head Wo Contrast  Result  Date: 09/07/2018 CLINICAL DATA:  Sepsis. Recent diagnosis of suspected metastatic disease in chest abdomen and pelvis on CT studies. New left sided proptosis and left upper and lower extremity weakness. No reported injury. EXAM: CT HEAD WITHOUT CONTRAST TECHNIQUE: Contiguous axial images were obtained from the base of the skull through the vertex without intravenous contrast. COMPARISON:  07/15/2017 head CT. FINDINGS: Brain: No evidence of parenchymal hemorrhage or extra-axial fluid collection. No mass lesion, mass effect, or midline shift. No CT evidence of acute infarction. Nonspecific mild subcortical and periventricular white matter hypodensity, most in keeping with chronic small vessel ischemic change. Cerebral volume is age appropriate. No ventriculomegaly. Vascular: No acute abnormality. Skull: No evidence of calvarial fracture. Sinuses/Orbits: No fluid levels. Mild mucoperiosteal thickening in the maxillary sinuses bilaterally. Other:  The mastoid air cells are unopacified. IMPRESSION: 1.  No evidence of acute intracranial abnormality. 2. Mild chronic small vessel ischemic changes in the cerebral white matter. 3. Mild chronic appearing paranasal sinusitis. Electronically Signed   By: Ilona Sorrel M.D.   On: 119-Jul-202019 12:06   Ct Angio Chest Pe W/cm &/or Wo Cm  Result Date: 09/07/2018 CLINICAL DATA:  Shortness of breath and weakness. 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 EXAM: CT ANGIOGRAPHY CHEST WITH CONTRAST TECHNIQUE: Multidetector CT imaging of the chest was performed using the standard protocol during bolus administration of intravenous contrast. Multiplanar CT image reconstructions and MIPs were obtained to evaluate the vascular anatomy. CONTRAST:  15mL ISOVUE-370 IOPAMIDOL (ISOVUE-370) INJECTION 76% COMPARISON:  CTA of the chest on 08/29/2018 FINDINGS: Cardiovascular: The pulmonary arteries are adequately opacified. There is no evidence of pulmonary  embolism. The thoracic aorta is normal in caliber and demonstrates no evidence of aneurysm or dissection. Proximal great vessels are normally patent. The heart size is stable and at the upper limits of normal. Pacemaker shows stable positioning. No pericardial fluid identified. Mediastinum/Nodes: Massive confluent lymphadenopathy of the left axilla extending into the left subpectoral region and encasing the region of the brachial plexus, left axillary vessels and left subclavian vessels appears progressively enlarged since the prior CTA. Index lateral subpectoral lymph node measured previously at 2.8 cm in short axis now measures approximately 4.1 cm. More inferior left axillary node measured at 2.5 cm in short axis previously now measures 3.1 cm. Confluent lymph node mass encasement of vessels does not cause arterial occlusion but may be causing significant mass effect on venous structures. The veins are not opacified on the study to determine whether any venous thrombus is present.  Associated right hilar and mediastinal lymphadenopathy also appears slightly more prominent compared to the prior study. Largest area right hilar lymphadenopathy measures approximately 2.2 cm. Stable large hiatal hernia. Lungs/Pleura: There also is progressive pulmonary involvement with lymphoma with definite progression since the prior CTA. All of the multiple bilateral pulmonary nodules show enlargement since the prior study. Index mass in the subpleural left lower lobe now measures approximately 2.2 x 3.7 cm (previously 1.7 x 3.3 cm). All other lesions also show enlargement. New small right pleural effusion. Small to moderate left pleural effusion appears stable since the prior study. Upper Abdomen: Stable splenomegaly. Musculoskeletal: No chest wall abnormality. No acute or significant osseous findings. Review of the MIP images confirms the above findings. IMPRESSION: 1. No evidence of pulmonary embolism. 2. Progression of lymphoma  since the prior CTA on 11/25 with enlargement of left axillary, subpectoral and chest wall lymph node masses with encasement of left subclavian and axillary vascular structures. Enlargement of right hilar and mediastinal lymph nodes since the prior study. Pulmonary involvement also shows significant progression with enlargement of all of the bilateral pulmonary nodule seen previously. 3. Normal arterial patency demonstrated of the left subclavian and axillary arteries. Venous patency cannot be determined on the CTA study and the patient would be at high risk of left upper extremity DVT given the large bulky lymphadenopathy present. 4. New small right pleural effusion. Stable small to moderate left pleural effusion. 5. Stable splenomegaly. Electronically Signed   By: Aletta Edouard M.D.   On: 109/26/202019 12:29    EKG: Independently reviewed.  EKG with atrial sensing, V pacing, inferior lateral T waves.  Assessment/Plan Active Problems:   Respiratory failure [facility])  72 year old female who has been recently diagnosed with high-grade B-cell lymphoma, who presents with B type symptoms including generalized weakness, malaise, chills/fevers, poor appetite for the last 6 days, with more severe symptoms for the last 24 hours including dyspnea.  On her initial physical examination, blood pressure 123/59, heart rate 82, respiratory rate 29-37, oxygen saturation 96% on 2 LPM [place], temperature 100.5 F.  Positive pallor, dry mucous membranes, lungs with decreased breath sounds bilaterally, heart S1-S2 present rhythmic, abdomen soft, positive lower extremity edema, severe left upper extremity edema.  Sodium 133, potassium 4.4, chloride 96, bicarb 25, glucose 114, BUN 30, creatinine 0.8, lactic acid venous 2.8, white count 14.7, hemoglobin 8.2, hematocrit 27.5, platelets 260, urine analysis negative for infection, chest radiograph with bilateral pleural effusions, bibasilar opacities, left pulmonary nodules, chest CT  reported negative for pulmonary embolism, positive enlargement of right hilar and mediastinal lymph nodes significant progressive enlargement of all bilateral pulmonary nodules small bilateral pleural effusions.   Patient will be admitted to the hospital with a working diagnosis of acute hypoxic respiratory failure due to progressive lymphoma with pulmonary involvement, to rule out postobstructive pneumonia with sepsis.   1.  Acute hypoxic respiratory failure.  Likely related to progressive high-grade B-cell lymphoma with pulmonary involvement, at this point unable to rule out an overt bacterial pneumonia (left lower lobe), leading to sepsis syndrome.  Will continue oximetry monitoring and supplemental oxygen per nasal cannula, IV fluids with isotonic saline, IV antibiotic therapy with IV ceftriaxone.  If in 24 hours no further evidence of pulmonary infection antibiotic therapy can be discontinued.  Follow-up on cell count, cultures and temperature curve.  2.  High-grade B-cell lymphoma.  Continue supportive medical therapy, IV analgesics for left upper extremity pain, follow-up with oncology recommendations.  3.  Iron deficiency anemia.  Continue iron supplements.  4.  Third-degree AV block s/p pacemaker.  Patient currently has a V paced rhythm, continue telemetry monitoring.  DVT prophylaxis: enoxaparin Code Status:  full  Family Communication: I spoke with patient's family at the bedside and all questions were addressed.   Disposition Plan: Telemetry   Consults called: Oncology (Dr. Burr Medico per ED)  Admission status: Inpatient      Gerome Apley MD [facility] Pager [phone]  If 7PM-7AM, please contact night-coverage [email or web address] Password [email or web address]  09/07/2018, 1:13 PM

## 2018-09-07 NOTE — ED Provider Notes (Signed)
[facility] DEPT Provider Note   CSN: [id number] Arrival date & time: 09/07/18  0757     History   Chief Complaint Chief Complaint  Patient presents with  . Shortness of Breath    HPI Patricia SIMMERMAN is a 72 y.o. female who has had a complicated medical course over the last several months presenting for fever, shortness of breath and cough.  Patient was recently admitted from 11/25 to 11/28.  Patient recently status post pacemaker placement on 7/19 for a complete heart block after presenting with fatigue.  After the procedure apparently patient began having left arm swelling.  There was concerns for a subclavian blood clot so the patient was started on Eliquis.  After taking her first dose she developed dark stools and presented to the emergency department.  She is noted to have a hemoglobin of 5 and admitted for transfusion.  Her Eliquis was discontinued after a formal left upper extremity ultrasound was negative for DVT (thought to be lymphadenopathy).  During her course patient underwent CT imaging of chest and abdomen that shows poorly differentiated malignancy.  On chart review, Dr. Annamaria Boots had consulted on the patient.  Initial biopsy reports revealed poorly differentiated malignancy, favoring carcinoma.  It appears patient has metastatic cancer without known primary site.  Patient was discharged home on 11/28.  She notes since that time she has been doing poorly.  She notes that she has generalized fatigue, shortness of breath and has a nonproductive cough.  She also reports that over the last 5 days to 1 week she has been having focal weakness of the left upper and left lower extremity to the point where she cannot move them anymore.  Patient also appears on exam to have dropping of the left eyelid.  There is no history of this in her chart on chart review.  Patient denies any head trauma.  She denies visual changes, difficulty with speech, numbness/tingling  of the face or extremities.  No vertigo.  She began feeling feverish a few days ago.  She called EMS today because she was severely short of breath and felt like "I was not getting enough O2".  She does not wear home oxygen.  She was hypoxic with EMS and placed on 2 L of O2.  Patient denies any headache, neck stiffness, chest pain, abdominal pain, nausea/vomiting or urinary symptoms.  She does admit to some diarrhea over the last several days.  HPI  Past Medical History:  Diagnosis Date  . Arthritis   . Hiatal hernia   . History of chicken pox   . Hyperlipidemia   . Pacemaker     Patient Active Problem List   Diagnosis Date Noted  . Left upper extremity swelling   . Metastatic cancer ([place])   . Hiatal hernia   . GI bleeding 08/29/2018  . Symptomatic anemia 08/29/2018  . Lymphedema of left arm 08/29/2018  . Complete heart block ([place]) 04/18/2018  . Allergic conjunctivitis of left eye 06/25/2017  . Rash of hands 10/12/2016  . Right ankle pain 08/31/2016  . Arthritis involving multiple sites 06/01/2016  . Skin abscess 02/28/2015  . 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 Hyperlipidemia 12/15/2012  . Vitamin D deficiency 12/15/2012  . Arthralgia of wrist 12/15/2012  . Arthralgia of knee 12/15/2012  . GERD (gastroesophageal reflux disease) 12/15/2012  . Hyperglycemia 09/12/2012    Past Surgical History:  Procedure Laterality Date  . ANKLE SURGERY    . PACEMAKER IMPLANT N/A  04/18/2018   Procedure: PACEMAKER IMPLANT;  Surgeon: Evans Lance, MD;  Location: [facility];  Service: Cardiovascular;  Laterality: N/A;     OB History   None      Home Medications    Prior to Admission medications   Medication Sig Start Date End Date Taking? Authorizing Provider  acetaminophen (TYLENOL) 650 MG CR tablet Take 1,300 mg by mouth every 8 (eight) hours as needed for pain.    [provider]  azelastine (OPTIVAR) 0.05 % ophthalmic solution Place 1 drop into both  eyes 2 (two) times daily. Patient taking differently: Place 1 drop into both eyes 2 (two) times daily as needed (vertigo).  06/25/17   Golden Circle, FNP  ferrous gluconate (FERGON) 324 MG tablet Take 1 tablet (324 mg total) by mouth 2 (two) times daily with a meal. 09/01/18   Gherghe, Vella Redhead, MD  meclizine (ANTIVERT) 12.5 MG tablet Take 1 tablet (12.5 mg total) by mouth 3 (three) times daily as needed for dizziness. 07/15/17   Mesner, Corene Cornea, MD  Vitamin D, Cholecalciferol, 50 MCG (2000 UT) CAPS Take 2,000 Units by mouth daily.    [provider]    Family History Family History  Problem Relation Age of Onset  . Hypertension Mother   . Arthritis Mother   . Arthritis Father   . Cancer Sister        Breast Cancer  . Heart disease Paternal Uncle     Social History Social History   Tobacco Use  . Smoking status: Former Smoker    Last attempt to quit: 10/05/2001    Years since quitting: 16.9  . Smokeless tobacco: Never Used  Substance Use Topics  . Alcohol use: No  . Drug use: No     Allergies   Patient has no known allergies.   Review of Systems Review of Systems  All other systems reviewed and are negative.    Physical Exam Updated Vital Signs BP (!) 121/94   Pulse 98   Temp (!) 100.5 F (38.1 C) (Rectal)   Resp (!) 37   SpO2 96%   Physical Exam  Constitutional: She appears well-developed and well-nourished.  HENT:  Head: Normocephalic and atraumatic.  Right Ear: External ear normal.  Left Ear: External ear normal.  Nose: Nose normal.  Mouth/Throat: Uvula is midline, oropharynx is clear and moist and mucous membranes are normal. No tonsillar exudate.  Mucous membranes are dry  Eyes: Pupils are equal, round, and reactive to light. Right eye exhibits no discharge. Left eye exhibits no discharge. No scleral icterus.  Noted left eye proptosis.  Normal extraocular movements.  No vertical or rotary nystagmus.  Right pupil is 4 mm.  Left pupil is 3 mm.    Neck: Trachea normal. Neck supple. No spinous process tenderness present. No neck rigidity. Normal range of motion present.  No nuchal rigidity or meningismus  Cardiovascular: Regular rhythm and intact distal pulses. Tachycardia present.  No murmur heard. Pulses:      Radial pulses are 2+ on the right side, and 2+ on the left side.       Dorsalis pedis pulses are 2+ on the right side, and 2+ on the left side.       Posterior tibial pulses are 2+ on the right side, and 2+ on the left side.  Left lower extremity nonpitting edema.    Pulmonary/Chest: Accessory muscle usage present. Tachypnea noted. No respiratory distress. She has decreased breath sounds in  the left upper field, the left middle field and the left lower field. She has rales in the right lower field and the left lower field. She exhibits no tenderness.  Patient hypoxic on room air.  Placed on 2 L O2.  She takes breaths between short sentences  Abdominal: Soft. Bowel sounds are normal. She exhibits no distension. There is no tenderness. There is no rigidity, no rebound and no guarding.  Musculoskeletal: She exhibits no edema.  Lymphadenopathy:    She has no cervical adenopathy.  Neurological: She is alert.  Mental Status:  Alert, oriented. No aphasia. Able to follow 2 step commands Cranial Nerves:  II:  Peripheral visual fields grossly intact.  Right pupil is 4 mm.  Left pupil is 3 mm.  Pupils are reactive the light bilaterally.  III,IV, VI: ptosis present on the left, extra-ocular motions intact bilaterally.  No vertical or rotary nystagmus V,VII: smile symmetric, eyebrows raise symmetric, facial light touch sensation equal VIII: hearing grossly normal to voice  X: uvula elevates symmetrically  XI: Not tested XII: midline tongue extension without fassiculations Motor:  Patient with 5/5 strength of the right upper and right lower extremity.  Patient with 2/5 strength of the left upper and left lower extremity. Sensory:  Sensation intact to light touch in all extremities.  Cerebellar: Unable to perform finger-to-nose test on the left side secondary to weakness of the left upper extremity.  Patient unable to lift left arm against gravity to perform pronator drift test.   Gait: Not tested CV: distal pulses palpable throughout   Skin: Skin is warm and dry. No rash noted. She is not diaphoretic.  Psychiatric: She has a normal mood and affect.  Nursing note and vitals reviewed.    ED Treatments / Results  Labs (all labs ordered are listed, but only abnormal results are displayed) Labs Reviewed  COMPREHENSIVE METABOLIC PANEL - Abnormal; Notable for the following components:      Result Value   Sodium 133 (*)    Chloride 96 (*)    Glucose, Bld 114 (*)    BUN 30 (*)    Calcium 12.5 (*)    Total Protein 4.9 (*)    Albumin 2.5 (*)    Total Bilirubin 3.4 (*)    All other components within normal limits  CBC WITH DIFFERENTIAL/PLATELET - Abnormal; Notable for the following components:   WBC 14.7 (*)    RBC 2.97 (*)    Hemoglobin 8.2 (*)    HCT 27.5 (*)    MCHC 29.8 (*)    RDW 20.6 (*)    Neutro Abs 11.8 (*)    Lymphs Abs 0.4 (*)    Monocytes Absolute 2.3 (*)    Abs Immature Granulocytes 0.23 (*)    All other components within normal limits  PROTIME-INR - Abnormal; Notable for the following components:   Prothrombin Time 15.4 (*)    All other components within normal limits  URINALYSIS, ROUTINE W REFLEX MICROSCOPIC - Abnormal; Notable for the following components:   Color, Urine AMBER (*)    All other components within normal limits  I-STAT CG4 LACTIC ACID, ED - Abnormal; Notable for the following components:   Lactic Acid, Venous 2.84 (*)    All other components within normal limits  I-STAT CG4 LACTIC ACID, ED - Abnormal; Notable for the following components:   Lactic Acid, Venous 2.32 (*)    All other components within normal limits  CULTURE, BLOOD (ROUTINE X 2)  CULTURE, BLOOD (ROUTINE  X 2)    URINE CULTURE  INFLUENZA PANEL BY PCR (TYPE A & B)  I-STAT TROPONIN, ED  TYPE AND SCREEN  ABO/RH    EKG EKG Interpretation  Date/Time:  Wednesday September 07 2018 08:05:16 EST Ventricular Rate:  97 PR Interval:    QRS Duration: 119 QT Interval:  361 QTC Calculation: 459 R Axis:   51 Text Interpretation:  AV dual-paced rhythm Probable anterolateral infarct, age indeterm Abnormal T, consider ischemia, lateral leads Confirmed by Fredia Sorrow [phone]) on 09/07/2018 8:25:58 AM   Radiology Dg Chest 2 View  Result Date: 09/07/2018 CLINICAL DATA:  Increasing shortness of breath. Decreased oxygen saturation since yesterday. EXAM: CHEST - 2 VIEW COMPARISON:  Single-view of the chest and CT chest 08/29/2018. FINDINGS: There is cardiomegaly without edema. Moderate left pleural effusion and basilar atelectasis are seen. Pulmonary nodules in the left upper lobe and along the periphery of the left lung are identified as seen on prior CT. Small right pulmonary nodule seen on CT are difficult to visualize on this examination. Large hiatal hernia is noted. No pneumothorax. IMPRESSION: No change in a moderate left pleural effusion and left basilar atelectasis. Pulmonary nodules. Cardiomegaly without edema. Hiatal hernia. Electronically Signed   By: Inge Rise M.D.   On: 1Nov 25, 202019 09:02   Ct Head Wo Contrast  Result Date: 09/07/2018 CLINICAL DATA:  Sepsis. Recent diagnosis of suspected metastatic disease in chest abdomen and pelvis on CT studies. New left sided proptosis and left upper and lower extremity weakness. No reported injury. EXAM: CT HEAD WITHOUT CONTRAST TECHNIQUE: Contiguous axial images were obtained from the base of the skull through the vertex without intravenous contrast. COMPARISON:  07/15/2017 head CT. FINDINGS: Brain: No evidence of parenchymal hemorrhage or extra-axial fluid collection. No mass lesion, mass effect, or midline shift. No CT evidence of acute infarction.  Nonspecific mild subcortical and periventricular white matter hypodensity, most in keeping with chronic small vessel ischemic change. Cerebral volume is age appropriate. No ventriculomegaly. Vascular: No acute abnormality. Skull: No evidence of calvarial fracture. Sinuses/Orbits: No fluid levels. Mild mucoperiosteal thickening in the maxillary sinuses bilaterally. Other:  The mastoid air cells are unopacified. IMPRESSION: 1.  No evidence of acute intracranial abnormality. 2. Mild chronic small vessel ischemic changes in the cerebral white matter. 3. Mild chronic appearing paranasal sinusitis. Electronically Signed   By: Ilona Sorrel M.D.   On: 1Nov 25, 202019 12:06   Ct Angio Chest Pe W/cm &/or Wo Cm  Result Date: 09/07/2018 CLINICAL DATA:  Shortness of breath and weakness. 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 EXAM: CT ANGIOGRAPHY CHEST WITH CONTRAST TECHNIQUE: Multidetector CT imaging of the chest was performed using the standard protocol during bolus administration of intravenous contrast. Multiplanar CT image reconstructions and MIPs were obtained to evaluate the vascular anatomy. CONTRAST:  156mL ISOVUE-370 IOPAMIDOL (ISOVUE-370) INJECTION 76% COMPARISON:  CTA of the chest on 08/29/2018 FINDINGS: Cardiovascular: The pulmonary arteries are adequately opacified. There is no evidence of pulmonary embolism. The thoracic aorta is normal in caliber and demonstrates no evidence of aneurysm or dissection. Proximal great vessels are normally patent. The heart size is stable and at the upper limits of normal. Pacemaker shows stable positioning. No pericardial fluid identified. Mediastinum/Nodes: Massive confluent lymphadenopathy of the left axilla extending into the left subpectoral region and encasing the region of the brachial plexus, left axillary vessels and left subclavian vessels appears progressively enlarged since the prior CTA. Index lateral subpectoral lymph node  measured previously at  2.8 cm in short axis now measures approximately 4.1 cm. More inferior left axillary node measured at 2.5 cm in short axis previously now measures 3.1 cm. Confluent lymph node mass encasement of vessels does not cause arterial occlusion but may be causing significant mass effect on venous structures. The veins are not opacified on the study to determine whether any venous thrombus is present. Associated right hilar and mediastinal lymphadenopathy also appears slightly more prominent compared to the prior study. Largest area right hilar lymphadenopathy measures approximately 2.2 cm. Stable large hiatal hernia. Lungs/Pleura: There also is progressive pulmonary involvement with lymphoma with definite progression since the prior CTA. All of the multiple bilateral pulmonary nodules show enlargement since the prior study. Index mass in the subpleural left lower lobe now measures approximately 2.2 x 3.7 cm (previously 1.7 x 3.3 cm). All other lesions also show enlargement. New small right pleural effusion. Small to moderate left pleural effusion appears stable since the prior study. Upper Abdomen: Stable splenomegaly. Musculoskeletal: No chest wall abnormality. No acute or significant osseous findings. Review of the MIP images confirms the above findings. IMPRESSION: 1. No evidence of pulmonary embolism. 2. Progression of lymphoma since the prior CTA on 11/25 with enlargement of left axillary, subpectoral and chest wall lymph node masses with encasement of left subclavian and axillary vascular structures. Enlargement of right hilar and mediastinal lymph nodes since the prior study. Pulmonary involvement also shows significant progression with enlargement of all of the bilateral pulmonary nodule seen previously. 3. Normal arterial patency demonstrated of the left subclavian and axillary arteries. Venous patency cannot be determined on the CTA study and the patient would be at high risk of left upper  extremity DVT given the large bulky lymphadenopathy present. 4. New small right pleural effusion. Stable small to moderate left pleural effusion. 5. Stable splenomegaly. Electronically Signed   By: Aletta Edouard M.D.   On: 120-Jul-202019 12:29    Procedures Procedures (including critical care time) CRITICAL CARE Performed by: Jillyn Ledger   Total critical care time: 60 minutes  Critical care time was exclusive of separately billable procedures and treating other patients.  Critical care was necessary to treat or prevent imminent or life-threatening deterioration.  Critical care was time spent personally by me on the following activities: development of treatment plan with patient and/or surrogate as well as nursing, discussions with consultants, evaluation of patient's response to treatment, examination of patient, obtaining history from patient or surrogate, ordering and performing treatments and interventions, ordering and review of laboratory studies, ordering and review of radiographic studies, pulse oximetry and re-evaluation of patient's condition.    Medications Ordered in ED Medications  sodium chloride 0.9 % bolus 1,000 mL (1,000 mLs Intravenous New Bag/Given 09/07/18 0840)    And  sodium chloride 0.9 % bolus 1,000 mL (has no administration in time range)  ceFEPIme (MAXIPIME) 2 g in sodium chloride 0.9 % 100 mL IVPB (2 g Intravenous New Bag/Given 09/07/18 0840)  metroNIDAZOLE (FLAGYL) IVPB 500 mg (has no administration in time range)  vancomycin (VANCOCIN) IVPB 1000 mg/200 mL premix (has no administration in time range)  acetaminophen (TYLENOL) tablet 1,000 mg (1,000 mg Oral Given 09/07/18 0840)     Initial Impression / Assessment and Plan / ED Course  I have reviewed the triage vital signs and the nursing notes.  Pertinent labs & imaging results that were available during my care of the patient were reviewed by me and considered in my medical decision making (see chart for  details).     72 year old female with complex medical history including recent diagnosis of metastatic cancer presenting with several day history of fever, left upper extremity and left lower extremity weakness, shortness of breath, cough.  Patient noted to be febrile, tachycardic, tachypneic and hypoxic on arrival.  Patient placed on 2L with improvement of hypoxia.  Sepsis protocol initiated.  Broad-spectrum antibiotics ordered.  CBC, CMP, lactic acid, blood cultures x2, UA, urine culture, influenza PCR, EKG, chest x-ray ordered.  Patient also noted to have left eye proptosis, as well as left upper extremity and left lower extremity weakness.  Will order CT of the head to evaluate.  Lactic acid and elevated.  Patient with leukocytosis.  Noted anemia not requiring immediate transfusion.  Flu test negative.  CT head unremarkable.  CTA of the chest with progression of lymphoma from prior.  There is no evidence of pneumonia.  There is a new small right pleural effusion. NO PE  Labs, EKG and CTA reviewed.  No obvious source of patient's infection however do believe patient is septic and will need admission.  Her breathing has improved and she is comfortable at this time.  No significant tachypnea.  I have updated patient as well as family on plan and they state understanding.  UA pending.  I discussed CODE STATUS with patient.  It appears on 11/25 she was made DNR.  Patient states that she does not wish to be DNR and wishes to be full code.  Family at bedside and are supportive this decision. Hospitalist was made aware on call for admission.   Patient appears stable for admission.  Patient case discussed and seen with Dr. Rogene Houston who is in agreement with plan.  Final Clinical Impressions(s) / ED Diagnoses   Final diagnoses:  Acute respiratory failure with hypoxia (HCC)  Sepsis, due to unspecified organism, unspecified whether acute organ dysfunction present [facility])  Shortness of breath  Left-sided  weakness  Pleural effusion    ED Discharge Orders    None       Lorelle Gibbs 09/07/18 1354    Fredia Sorrow, MD 09/15/18 [phone]

## 2018-09-08 ENCOUNTER — Inpatient Hospital Stay: Payer: Self-pay

## 2018-09-08 ENCOUNTER — Telehealth: Payer: Self-pay

## 2018-09-08 ENCOUNTER — Encounter (HOSPITAL_COMMUNITY): Payer: Self-pay | Admitting: Pharmacist

## 2018-09-08 DIAGNOSIS — E440 Moderate protein-calorie malnutrition: Secondary | ICD-10-CM

## 2018-09-08 DIAGNOSIS — Z09 Encounter for follow-up examination after completed treatment for conditions other than malignant neoplasm: Secondary | ICD-10-CM | POA: Diagnosis not present

## 2018-09-08 DIAGNOSIS — D479 Neoplasm of uncertain behavior of lymphoid, hematopoietic and related tissue, unspecified: Secondary | ICD-10-CM

## 2018-09-08 DIAGNOSIS — D594 Other nonautoimmune hemolytic anemias: Secondary | ICD-10-CM

## 2018-09-08 LAB — PREPARE RBC (CROSSMATCH)

## 2018-09-08 LAB — BASIC METABOLIC PANEL
ANION GAP: 10 (ref 5–15)
BUN: 29 mg/dL — ABNORMAL HIGH (ref 8–23)
CO2: 24 mmol/L (ref 22–32)
Calcium: 12.2 mg/dL — ABNORMAL HIGH (ref 8.9–10.3)
Chloride: 101 mmol/L (ref 98–111)
Creatinine, Ser: 0.8 mg/dL (ref 0.44–1.00)
GFR calc Af Amer: >60 mL/min (ref >60)
GFR calc non Af Amer: >60 mL/min (ref >60)
Glucose, Bld: 104 mg/dL — ABNORMAL HIGH (ref 70–99)
Potassium: 4.2 mmol/L (ref 3.5–5.1)
Sodium: 135 mmol/L (ref 135–145)

## 2018-09-08 LAB — CBC
HCT: 24 % — ABNORMAL LOW (ref 36.0–46.0)
Hemoglobin: 6.9 g/dL — CL (ref 12.0–15.0)
MCH: 27 pg (ref 26.0–34.0)
MCHC: 28.8 g/dL — ABNORMAL LOW (ref 30.0–36.0)
MCV: 93.8 fL (ref 80.0–100.0)
NRBC: 0 % (ref 0.0–0.2)
Platelets: 195 10*3/uL (ref 150–400)
RBC: 2.56 MIL/uL — ABNORMAL LOW (ref 3.87–5.11)
RDW: 21.1 % — ABNORMAL HIGH (ref 11.5–15.5)
WBC: 9.8 10*3/uL (ref 4.0–10.5)

## 2018-09-08 LAB — RETICULOCYTES
IMMATURE RETIC FRACT: 21.3 % — AB (ref 2.3–15.9)
RBC.: 2.56 MIL/uL — ABNORMAL LOW (ref 3.87–5.11)
RETIC COUNT ABSOLUTE: 201.5 10*3/uL — AB (ref 19.0–186.0)
Retic Ct Pct: 7.9 % — ABNORMAL HIGH (ref 0.4–3.1)

## 2018-09-08 LAB — URINE CULTURE: Culture: NO GROWTH

## 2018-09-08 LAB — LACTATE DEHYDROGENASE: LDH: 412 U/L — ABNORMAL HIGH (ref 98–192)

## 2018-09-08 LAB — BILIRUBIN, FRACTIONATED(TOT/DIR/INDIR)
Bilirubin, Direct: 0.7 mg/dL — ABNORMAL HIGH (ref 0.0–0.2)
Indirect Bilirubin: 1.3 mg/dL — ABNORMAL HIGH (ref 0.3–0.9)
Total Bilirubin: 2 mg/dL — ABNORMAL HIGH (ref 0.3–1.2)

## 2018-09-08 LAB — DIRECT ANTIGLOBULIN TEST (NOT AT ARMC)
DAT, IgG: NEGATIVE
DAT, complement: POSITIVE

## 2018-09-08 MED ORDER — SODIUM CHLORIDE 0.9 % IV SOLN
500.0000 mg | Freq: Two times a day (BID) | INTRAVENOUS | Status: AC
  Administered 2018-09-08 – 2018-09-09 (×2): 500 mg via INTRAVENOUS
  Filled 2018-09-08 (×2): qty 4

## 2018-09-08 MED ORDER — ORAL CARE MOUTH RINSE
15.0000 mL | Freq: Two times a day (BID) | OROMUCOSAL | Status: DC
  Administered 2018-09-09 – 2018-10-04 (×39): 15 mL via OROMUCOSAL

## 2018-09-08 MED ORDER — ALLOPURINOL 300 MG PO TABS: 300.0000 mg | ORAL_TABLET | Freq: Every day | ORAL | 3 refills | Status: AC

## 2018-09-08 MED ORDER — FOLIC ACID 1 MG PO TABS
1.0000 mg | ORAL_TABLET | Freq: Every day | ORAL | Status: DC
  Administered 2018-09-08 – 2018-10-05 (×28): 1 mg via ORAL
  Filled 2018-09-08 (×29): qty 1

## 2018-09-08 MED ORDER — ENSURE ENLIVE PO LIQD
237.0000 mL | Freq: Two times a day (BID) | ORAL | Status: DC
  Administered 2018-09-09 – 2018-10-04 (×25): 237 mL via ORAL

## 2018-09-08 MED ORDER — ALLOPURINOL 300 MG PO TABS
300.0000 mg | ORAL_TABLET | Freq: Every day | ORAL | Status: DC
  Administered 2018-09-09 – 2018-10-05 (×28): 300 mg via ORAL
  Filled 2018-09-08 (×27): qty 1

## 2018-09-08 MED ORDER — SODIUM CHLORIDE 0.9% IV SOLUTION
Freq: Once | INTRAVENOUS | Status: AC
  Administered 2018-09-08: 17:00:00 via INTRAVENOUS

## 2018-09-08 MED ORDER — SODIUM CHLORIDE 0.9% IV SOLUTION
Freq: Once | INTRAVENOUS | Status: AC
  Administered 2018-09-08: 21:00:00 via INTRAVENOUS

## 2018-09-08 MED ORDER — ZOLEDRONIC ACID 4 MG/5ML IV CONC
4.0000 mg | Freq: Once | INTRAVENOUS | Status: AC
  Administered 2018-09-08: 4 mg via INTRAVENOUS
  Filled 2018-09-08: qty 5

## 2018-09-08 NOTE — Progress Notes (Signed)
Discussed with Dr. Burr Medico that we are unable to place PICC due to left sided subclavian vein blockage and placement of Pacemaker/Debfibrallator in 04/2018. Dr. Burr Medico will consult IR for PICC placement.

## 2018-09-08 NOTE — Progress Notes (Signed)
Initial Nutrition Assessment  DOCUMENTATION CODES:   Non-severe (moderate) malnutrition in context of acute illness/injury  INTERVENTION:   Provide Ensure Enlive po BID, each supplement provides 350 kcal and 20 grams of protein  NUTRITION DIAGNOSIS:   Moderate Malnutrition related to acute illness, poor appetite as evidenced by percent weight loss, energy intake < or equal to 75% for > or equal to 1 month, mild muscle depletion.  GOAL:   Patient will meet greater than or equal to 90% of their needs  MONITOR:   PO intake, Supplement acceptance, Labs, Weight trends, I & O's  REASON FOR ASSESSMENT:   Malnutrition Screening Tool    ASSESSMENT:   72 y.o. 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  During her hospitalization she underwent left axillary lymph node biopsy.  She was discharged home in a stable condition, but apparently she has been developing worsening weakness, generalized, worse with exertion, no improving factors, associated with malaise, chills and poor appetite.  Patient in room with tech taking vitals and daughter at bedside. Per pt's daughter, pt has consumed mainly bites of foods today. This has been the norm for her at home as well. Pt did not eat very much of her lunch at all. States she has no appetite and feels full quickly. Pt was ordered Ensure supplements during her previous admission 11/27 and pt's daughter states they tried to get her to drink them at home. Pt is agreeable to trying Ensure supplements this admission.   Per weight records, pt has lost 24 lb since 7/15 (11% wt loss x 5 months ,significant for time frame).  Pt states she believes her UBW to be 220 lb. Pt with some edema present during visit which tech noticed as well.   Labs reviewed. Medications: Vitamin D tablet daily, D5 -.9% NaCl infusion at  75  NUTRITION - FOCUSED PHYSICAL EXAM:  Fluid may be masking degree of depletions.    Most Recent Value  Orbital Region  No depletion  Upper Arm Region  No depletion  Thoracic and Lumbar Region  Unable to assess  Buccal Region  No depletion  Temple Region  Mild depletion  Clavicle Bone Region  Mild depletion  Clavicle and Acromion Bone Region  Mild depletion  Scapular Bone Region  Unable to assess  Dorsal Hand  No depletion  Patellar Region  No depletion  Anterior Thigh Region  No depletion  Posterior Calf Region  No depletion  Edema (RD Assessment)  Severe       Diet Order:   Diet Order            Diet regular Room service appropriate? Yes; Fluid consistency: Thin  Diet effective now              EDUCATION NEEDS:   Education needs have been addressed  Skin:  Skin Assessment: Reviewed RN Assessment  Last BM:  11/27  Height:   Ht Readings from Last 1 Encounters:  09/08/18 5\' 6"  (1.676 m)    Weight:   Wt Readings from Last 1 Encounters:  09/08/18 89.3 kg    Ideal Body Weight:  59.1 kg  BMI:  Body mass index is 31.78 kg/m.  Estimated Nutritional Needs:   Kcal:  1700-1900  Protein:  90-100g  Fluid:  1.7L/day   Clayton Bibles, MS, RD, LDN [facility] Clinical Dietitian Pager: [phone] After Hours  Pager: [phone]  

## 2018-09-08 NOTE — Progress Notes (Signed)
PROGRESS NOTE    Patricia Murillo  YFV:[id number] DOB: 1946-05-15 DOA: 09/07/2018 PCP: Patient, No Pcp Per  Brief Narrative:  HPI per Dr. Riccardo Dubin Arrien on 09/07/18  Patricia Murillo is a 72 y.o. 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  During her hospitalization she underwent left axillary lymph node biopsy.  She was discharged home in a stable condition, but apparently she has been developing worsening weakness, generalized, worse with exertion, no improving factors, associated with malaise, chills and poor appetite.  Over the last 24 hours her symptoms have been more severe to the point where she has difficulty standing. She has persistent left upper extremity edema which has been painful, sharp in nature up to 10 out of 10 intensity.  Her weakness has led to dyspnea which has been severe in intensity, and triggered her emergency room visit today.  ED Course: Patient was found ill looking appearing, febrile, positive leukocytosis, concern for systemic infection, referred for admission for evaluation.  **Oncology concerned about her progression of her high-grade B-cell lymphoma and is planning on starting chemotherapy tomorrow and have ordered a PICC line placement.  Sepsis physiology is improved.  Patient still remains hypocalcemic so oncology is written for [place] and continue with IV fluids.  Assessment & Plan:   Active Problems:   Respiratory failure (HCC)   Lymphoma (HCC)   Sepsis (HCC)   High grade B-cell lymphoma (HCC)   Malnutrition of moderate degree  Acute hypoxic respiratory failure.  Likely related to progressive high-grade B-cell lymphoma with pulmonary involvement -At this point unable to rule out an overt bacterial pneumonia (left lower lobe), leading to sepsis syndrome but ? If patient is really septic. WBC is normal now  (14.7  -> 9.8) and she had a mild temp of 100.5 which has resovled; ? If Leukocytosis was from dehydration  -Continue oximetry monitoring and supplemental oxygen per nasal cannula,  -IV fluids with isotonic saline to D5 normal saline rate of 75 mL's per hour -IV antibiotic therapy with IV ceftriaxone and IV Metronidazole was started. Will continue IV Ceftriaxone and D/C IV Flagyl  -If in 24 hours no further evidence of pulmonary infection antibiotic therapy can be discontinued.   -Continue to Follow-up on cell count, cultures and temperature curve. -Wean O2 as Tolerated   High-grade B-cell lymphoma -Continue supportive medical therapy -IV analgesics for left upper extremity pain; Head CT r/o  -Oncology consulted and recommending Starting R-CHOP and patient to get Rituxan and CHOP on different days -Oncology Dr. Burr Medico does not recommend a bone marrow biopsy as this would not change restaging imaging and management -Dr. Burr Medico has ordered FISH to rule out double hit or triple hit high-grade lymphoma -Patient to have PICC line placed tomorrow by IR start initiation of chemotherapy -Oncology gave the patient Solu-Medrol 500 mg every 12 for 2 doses today -Pain Control with Acetaminophen, Hydrocodone-Acetaminophen 1 tab po q6hprn, and IV Morphine 2 mg q2hprn  Acute encephalopathy likely in the setting of hypercalcemia -Stable -Patient does answer questions but is a little somnolent -Continue monitor -Head CT noncontrast showed no evidence of any acute intracranial abnormality.  There is mild chronic small vessel ischemic changes in the cerebral white matter.  There is also mild chronic appearing paranasal sinusitis. -If not improving and calcium level is improved we will obtain an MRI   Hypercalcemia likely of Malignancy -Calcium  level was two 12.2 and corrected calcium Eckley higher than that due to albumin level -Tinea with D5 normal saline at rate of 75 mL's per hour -Patient was given zoledronic  acid IV 4 mg once today per oncology  Iron deficiency anemia/Hemolysis -Patient's hemoglobin/hematocrit went from 8.2/27.5 is now 6.9/24.2 -Continue with Ferrous Gluconate 324 mg po BID -Type and screen and transfuse 2 units of irradiated blood -Patient's LDH was 4.12 and T Bil was 2.0 -Continue to Monitor for S/Sx of Bleeding  -Repeat CBC in AM   Third-degree AV block s/p pacemaker -Patient currently has a V paced rhythm -Continue telemetry monitoring.  Obesity -Estimated body mass index is 31.78 kg/m as calculated from the following:   Height as of this encounter: 5' 6"  (1.676 m).   Weight as of this encounter: 89.3 kg. -Weight Loss Counseling given  Non-Severe (moderate) malnutrition in the context of acute illness/injury -Nutrition is consulted for further evaluation recommendations -Continue with Ensure Enlive p.o. twice daily  Left Arm Weakness -Possibly secondary to high-grade lymphoma -Continue monitor and may need an MRI to rule out strokelike symptoms but will hold off for now  DVT prophylaxis: Enoxaparin 40 mg sq q24h Code Status: FULL CODE Family Communication: Discussed with Daughter at bedside Disposition Plan: Remain Inpatient for Chemotherapy   Consultants:   Medical Oncology   Procedures:  PICC Line to be placed in AM R-CHOP Chemo to be initiated    Antimicrobials:  Anti-infectives (From admission, onward)   Start     Dose/Rate Route Frequency Ordered Stop   09/07/18 2000  cefTRIAXone (ROCEPHIN) 1 g in sodium chloride 0.9 % 100 mL IVPB     1 g 200 mL/hr over 30 Minutes Intravenous Every 24 hours 09/07/18 1844     09/07/18 0830  ceFEPIme (MAXIPIME) 2 g in sodium chloride 0.9 % 100 mL IVPB     2 g 200 mL/hr over 30 Minutes Intravenous  Once 09/07/18 0820 09/07/18 0912   09/07/18 0830  metroNIDAZOLE (FLAGYL) IVPB 500 mg  Status:  Discontinued     500 mg 100 mL/hr over 60 Minutes Intravenous Every 8 hours 09/07/18 0820 09/08/18 1812   09/07/18  0830  vancomycin (VANCOCIN) IVPB 1000 mg/200 mL premix     1,000 mg 200 mL/hr over 60 Minutes Intravenous  Once 09/07/18 0820 09/07/18 1157     Subjective: Seen and examined at bedside and was a little drowsy but does answer questions.  No chest pain, lightheadedness.  Denies any current pain at this time.  Was appearing very sleepy and would fall back asleep during my questioning.  Has some lower extremity swelling along with some upper extremity swelling.  No other concerns or complaints at this time and states that shortness of breath is stable from yesterday and may be slightly improved.  Objective: Vitals:   09/08/18 1536 09/08/18 1555 09/08/18 1704 09/08/18 1735  BP: (!) 115/54  120/65 113/67  Pulse: 89  85 82  Resp:   17 18  Temp:   98.3 F (36.8 C) 97.7 F (36.5 C)  TempSrc:   Oral Oral  SpO2:  95% 96% 97%  Weight:      Height:        Intake/Output Summary (Last 24 hours) at 09/08/2018 1751 Last data filed at 09/08/2018 1348 Gross per 24 hour  Intake 936.13 ml  Output 1150 ml  Net -213.87 ml   Filed Weights   09/08/18 0505  Weight: 89.3 kg    Examination: Physical  Exam:  Constitutional: WN/WD obese Caucasian female in NAD and appears calm; She is somnolent and drowsy Eyes: Lids and conjunctivae normal, sclerae anicteric  ENMT: External Ears, Nose appear normal. Grossly normal hearing. Neck: Appears supple with no JVD Respiratory: Diminished to auscultation bilaterally with coarse breath sounds, no wheezing, rales, rhonchi or crackles. Normal respiratory effort and patient is not tachypenic. No accessory muscle use.  Cardiovascular: RRR, no murmurs / rubs / gallops. S1 and S2 auscultated. Has 1-2+ LE Edema and 2+ Upper Left Extremity edema Abdomen: Soft, non-tender, Distended 2/2 body habitus. No masses palpated. No appreciable hepatosplenomegaly. Bowel sounds positive x4.  GU: Deferred. Musculoskeletal: No clubbing / cyanosis of digits/nails.   Skin: No rashes,  lesions, ulcers on a limited skin evaluation. No induration; Warm and dry.  Neurologic: CN 2-12 grossly intact with no focal deficits but is lethargic. Romberg sign and cerebellar reflexes not assessed.  Has some left Arm weakness that is a little more pronounced than the Right.  Psychiatric: Impaired judgment and insight. Somnolent and a little drowsy.   Data Reviewed: I have personally reviewed following labs and imaging studies  CBC: Recent Labs  Lab 09/07/18 0807 09/08/18 0337  WBC 14.7* 9.8  NEUTROABS 11.8*  --   HGB 8.2* 6.9*  HCT 27.5* 24.0*  MCV 92.6 93.8  PLT 260 562   Basic Metabolic Panel: Recent Labs  Lab 09/07/18 0807 09/08/18 0337  NA 133* 135  K 4.4 4.2  CL 96* 101  CO2 25 24  GLUCOSE 114* 104*  BUN 30* 29*  CREATININE 0.82 0.80  CALCIUM 12.5* 12.2*   GFR: Estimated Creatinine Clearance: 72.6 mL/min (by C-G formula based on SCr of 0.8 mg/dL). Liver Function Tests: Recent Labs  Lab 09/07/18 0807 09/08/18 0337  AST 24  --   ALT 13  --   ALKPHOS 76  --   BILITOT 3.4* 2.0*  PROT 4.9*  --   ALBUMIN 2.5*  --    No results for input(s): LIPASE, AMYLASE in the last 168 hours. No results for input(s): AMMONIA in the last 168 hours. Coagulation Profile: Recent Labs  Lab 09/07/18 0807  INR 1.23   Cardiac Enzymes: No results for input(s): CKTOTAL, CKMB, CKMBINDEX, TROPONINI in the last 168 hours. BNP (last 3 results) No results for input(s): PROBNP in the last 8760 hours. HbA1C: No results for input(s): HGBA1C in the last 72 hours. CBG: No results for input(s): GLUCAP in the last 168 hours. Lipid Profile: No results for input(s): CHOL, HDL, LDLCALC, TRIG, CHOLHDL, LDLDIRECT in the last 72 hours. Thyroid Function Tests: No results for input(s): TSH, T4TOTAL, FREET4, T3FREE, THYROIDAB in the last 72 hours. Anemia Panel: Recent Labs    09/08/18 0337  RETICCTPCT 7.9*   Sepsis Labs: Recent Labs  Lab 09/07/18 0817 09/07/18 1056  LATICACIDVEN  2.84* 2.32*    Recent Results (from the past 240 hour(s))  MRSA PCR Screening     Status: None   Collection Time: 08/30/18  1:06 AM  Result Value Ref Range Status   MRSA by PCR NEGATIVE NEGATIVE Final    Comment:        The GeneXpert MRSA Assay (FDA approved for NASAL specimens only), is one component of a comprehensive MRSA colonization surveillance program. It is not intended to diagnose MRSA infection nor to guide or monitor treatment for MRSA infections. Performed at [facility], [place] [address] [place], [place] [address]   Culture, blood (Routine x 2)  Status: None (Preliminary result)   Collection Time: 09/07/18  8:07 AM  Result Value Ref Range Status   Specimen Description   Final    BLOOD LEFT HAND Performed at [facility] [address] [place], [place] [address]    Special Requests   Final    Blood Culture adequate volume Performed at [facility] [address], [place], [place] [address]    Culture   Final    NO GROWTH 1 DAY Performed at [facility], [place] [address], [place], [place] [address]    Report Status PENDING  Incomplete  Culture, blood (Routine x 2)     Status: None (Preliminary result)   Collection Time: 09/07/18  8:18 AM  Result Value Ref Range Status   Specimen Description   Final    BLOOD RIGHT ANTECUBITAL Performed at [facility] [address], [place], [place] [address]    Special Requests   Final    BOTTLES DRAWN AEROBIC AND ANAEROBIC Blood Culture adequate volume Performed at [facility] [address] [place], [place] [address]    Culture   Final    NO GROWTH 1 DAY Performed at [facility], [place] [address], [place], [place] [address]    Report Status PENDING  Incomplete  Urine culture     Status: None   Collection Time: 09/07/18  8:22 AM  Result Value Ref Range Status   Specimen Description   Final    URINE, CLEAN  CATCH Performed at [facility], [place] [address], [place], [place] [address]    Special Requests   Final    NONE Performed at [facility], [place] [address] [place], [place] [address]    Culture   Final    NO GROWTH Performed at [facility], [place] [address] [place],  [address]    Report Status 09/08/2018 FINAL  Final    Radiology Studies: Dg Chest 2 View  Result Date: 09/07/2018 CLINICAL DATA:  Increasing shortness of breath. Decreased oxygen saturation since yesterday. EXAM: CHEST - 2 VIEW COMPARISON:  Single-view of the chest and CT chest 08/29/2018. FINDINGS: There is cardiomegaly without edema. Moderate left pleural effusion and basilar atelectasis are seen. Pulmonary nodules in the left upper lobe and along the periphery of the left lung are identified as seen on prior CT. Small right pulmonary nodule seen on CT are difficult to visualize on this examination. Large hiatal hernia is noted. No pneumothorax. IMPRESSION: No change in a moderate left pleural effusion and left basilar atelectasis. Pulmonary nodules. Cardiomegaly without edema. Hiatal hernia. Electronically Signed   By: Inge Rise M.D.   On: 106-01-202019 09:02   Ct Head Wo Contrast  Result Date: 09/07/2018 CLINICAL DATA:  Sepsis. Recent diagnosis of suspected metastatic disease in chest abdomen and pelvis on CT studies. New left sided proptosis and left upper and lower extremity weakness. No reported injury. EXAM: CT HEAD WITHOUT CONTRAST TECHNIQUE: Contiguous axial images were obtained from the base of the skull through the vertex without intravenous contrast. COMPARISON:  07/15/2017 head CT. FINDINGS: Brain: No evidence of parenchymal hemorrhage or extra-axial fluid collection. No mass lesion, mass effect, or midline shift. No CT evidence of acute infarction. Nonspecific mild subcortical and periventricular white matter hypodensity, most in keeping with chronic small vessel  ischemic change. Cerebral volume is age appropriate. No ventriculomegaly. Vascular: No acute abnormality. Skull: No evidence of calvarial fracture.  Sinuses/Orbits: No fluid levels. Mild mucoperiosteal thickening in the maxillary sinuses bilaterally. Other:  The mastoid air cells are unopacified. IMPRESSION: 1.  No evidence of acute intracranial abnormality. 2. Mild chronic small vessel ischemic changes in the cerebral white matter. 3. Mild chronic appearing paranasal sinusitis. Electronically Signed   By: Ilona Sorrel M.D.   On: 1Feb 29, 202019 12:06   Ct Angio Chest Pe W/cm &/or Wo Cm  Result Date: 09/07/2018 CLINICAL DATA:  Shortness of breath and weakness. 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 EXAM: CT ANGIOGRAPHY CHEST WITH CONTRAST TECHNIQUE: Multidetector CT imaging of the chest was performed using the standard protocol during bolus administration of intravenous contrast. Multiplanar CT image reconstructions and MIPs were obtained to evaluate the vascular anatomy. CONTRAST:  153m ISOVUE-370 IOPAMIDOL (ISOVUE-370) INJECTION 76% COMPARISON:  CTA of the chest on 08/29/2018 FINDINGS: Cardiovascular: The pulmonary arteries are adequately opacified. There is no evidence of pulmonary embolism. The thoracic aorta is normal in caliber and demonstrates no evidence of aneurysm or dissection. Proximal great vessels are normally patent. The heart size is stable and at the upper limits of normal. Pacemaker shows stable positioning. No pericardial fluid identified. Mediastinum/Nodes: Massive confluent lymphadenopathy of the left axilla extending into the left subpectoral region and encasing the region of the brachial plexus, left axillary vessels and left subclavian vessels appears progressively enlarged since the prior CTA. Index lateral subpectoral lymph node measured previously at 2.8 cm in short axis now measures approximately 4.1 cm. More inferior left axillary node measured  at 2.5 cm in short axis previously now measures 3.1 cm. Confluent lymph node mass encasement of vessels does not cause arterial occlusion but may be causing significant mass effect on venous structures. The veins are not opacified on the study to determine whether any venous thrombus is present. Associated right hilar and mediastinal lymphadenopathy also appears slightly more prominent compared to the prior study. Largest area right hilar lymphadenopathy measures approximately 2.2 cm. Stable large hiatal hernia. Lungs/Pleura: There also is progressive pulmonary involvement with lymphoma with definite progression since the prior CTA. All of the multiple bilateral pulmonary nodules show enlargement since the prior study. Index mass in the subpleural left lower lobe now measures approximately 2.2 x 3.7 cm (previously 1.7 x 3.3 cm). All other lesions also show enlargement. New small right pleural effusion. Small to moderate left pleural effusion appears stable since the prior study. Upper Abdomen: Stable splenomegaly. Musculoskeletal: No chest wall abnormality. No acute or significant osseous findings. Review of the MIP images confirms the above findings. IMPRESSION: 1. No evidence of pulmonary embolism. 2. Progression of lymphoma since the prior CTA on 11/25 with enlargement of left axillary, subpectoral and chest wall lymph node masses with encasement of left subclavian and axillary vascular structures. Enlargement of right hilar and mediastinal lymph nodes since the prior study. Pulmonary involvement also shows significant progression with enlargement of all of the bilateral pulmonary nodule seen previously. 3. Normal arterial patency demonstrated of the left subclavian and axillary arteries. Venous patency cannot be determined on the CTA study and the patient would be at high risk of left upper extremity DVT given the large bulky lymphadenopathy present. 4. New small right pleural effusion. Stable small to moderate  left pleural effusion. 5. Stable splenomegaly. Electronically Signed   By: GAletta EdouardM.D.   On: 1Feb 29, 202019 12:29   [place] Site Rite  Result Date: 09/08/2018 If Site Rite image not attached, placement could not be confirmed due to  current cardiac rhythm.  Scheduled Meds: . sodium chloride   Intravenous Once  . sodium chloride   Intravenous Once  . cholecalciferol  2,000 Units Oral Daily  . enoxaparin (LOVENOX) injection  40 mg Subcutaneous Q24H  . [START ON 09/09/2018] feeding supplement (ENSURE ENLIVE)  237 mL Oral BID BM  . ferrous gluconate  324 mg Oral BID WC  . mouth rinse  15 mL Mouth Rinse BID   Continuous Infusions: . cefTRIAXone (ROCEPHIN)  IV Stopped (09/07/18 2152)  . dextrose 5 % and 0.9% NaCl 75 mL/hr at 09/08/18 1540  . methylPREDNISolone (SOLU-MEDROL) injection 500 mg (09/08/18 1219)  . metronidazole 500 mg (09/08/18 1319)    LOS: 1 day   Kerney Elbe, DO [facility] PAGER is on [place]  If 7PM-7AM, please contact night-coverage [email or web address] Password [email or web address] 09/08/2018, 5:51 PM

## 2018-09-08 NOTE — Progress Notes (Addendum)
Patricia Murillo   DOB:Dec 05, 1945   ZG#:[id number]   [phone]  Hem/Onc follow up note   Subjective: Pt remains to be lethargic this morning, similar to yesterday.  Her mental status seems to be improved in the late afternoon when I saw her again, after Zometa infusion. She is very weak, afebrile today, VS stable. Hg was 6.9 this morning and she will receive 2u blood tody   Objective:  Vitals:   09/08/18 1704 09/08/18 1735  BP: 120/65 113/67  Pulse: 85 82  Resp: 17 18  Temp: 98.3 F (36.8 C) 97.7 F (36.5 C)  SpO2: 96% 97%    Body mass index is 31.78 kg/m.  Intake/Output Summary (Last 24 hours) at 09/08/2018 2044 Last data filed at 09/08/2018 1933 Gross per 24 hour  Intake 1217.8 ml  Output 1150 ml  Net 67.8 ml     Sclerae unicteric  Oropharynx clear  (+) bulky adenopathy at left axilla   Lungs clear -- no rales or rhonchi, decreased breath sounds bilateral lung, mildly tachypneic  Heart regular rate and rhythm  Abdomen benign, soft   MSK no focal spinal tenderness, (+) severe edema of the left upper skin the, and bilateral lower extremity  Neuro: lethargic but arousable, and follow commands.  Her left upper extremity strength is significantly reduced. She moves all extremities    CBG (last 3)  No results for input(s): GLUCAP in the last 72 hours.   Labs:  Lab Results  Component Value Date   WBC 9.8 09/08/2018   HGB 6.9 (LL) 09/08/2018   HCT 24.0 (L) 09/08/2018   MCV 93.8 09/08/2018   PLT 195 09/08/2018   NEUTROABS 11.8 (H) [id number]   CMP Latest Ref Rng & Units 09/08/2018 09/07/2018 09/01/2018  Glucose 70 - 99 mg/dL 104(H) 114(H) 106(H)  BUN 8 - 23 mg/dL 29(H) 30(H) 12  Creatinine 0.44 - 1.00 mg/dL 0.80 0.82 0.90  Sodium 135 - 145 mmol/L 135 133(L) 132(L)  Potassium 3.5 - 5.1 mmol/L 4.2 4.4 3.4(L)  Chloride 98 - 111 mmol/L 101 96(L) 99  CO2 22 - 32 mmol/L 24 25 25   Calcium 8.9 - 10.3 mg/dL 12.2(H) 12.5(H) 10.4(H)  Total Protein 6.5 - 8.1 g/dL - 4.9(L) -   Total Bilirubin 0.3 - 1.2 mg/dL 2.0(H) 3.4(H) -  Alkaline Phos 38 - 126 U/L - 76 -  AST 15 - 41 U/L - 24 -  ALT 0 - 44 U/L - 13 -     Urine Studies No results for input(s): UHGB, CRYS in the last 72 hours.  Invalid input(s): UACOL, UAPR, USPG, UPH, UTP, UGL, UKET, UBIL, UNIT, UROB, ULEU, UEPI, UWBC, URBC, UBAC, CAST, [place], [place]  Basic Metabolic Panel: Recent Labs  Lab 09/07/18 0807 09/08/18 0337  NA 133* 135  K 4.4 4.2  CL 96* 101  CO2 25 24  GLUCOSE 114* 104*  BUN 30* 29*  CREATININE 0.82 0.80  CALCIUM 12.5* 12.2*   GFR Estimated Creatinine Clearance: 72.6 mL/min (by C-G formula based on SCr of 0.8 mg/dL). Liver Function Tests: Recent Labs  Lab 09/07/18 0807 09/08/18 0337  AST 24  --   ALT 13  --   ALKPHOS 76  --   BILITOT 3.4* 2.0*  PROT 4.9*  --   ALBUMIN 2.5*  --    No results for input(s): LIPASE, AMYLASE in the last 168 hours. No results for input(s): AMMONIA in the last 168 hours. Coagulation profile Recent Labs  Lab 09/07/18 0807  INR 1.23  CBC: Recent Labs  Lab 09/07/18 0807 09/08/18 0337  WBC 14.7* 9.8  NEUTROABS 11.8*  --   HGB 8.2* 6.9*  HCT 27.5* 24.0*  MCV 92.6 93.8  PLT 260 195   Cardiac Enzymes: No results for input(s): CKTOTAL, CKMB, CKMBINDEX, TROPONINI in the last 168 hours. BNP: Invalid input(s): POCBNP CBG: No results for input(s): GLUCAP in the last 168 hours. D-Dimer No results for input(s): DDIMER in the last 72 hours. Hgb A1c No results for input(s): HGBA1C in the last 72 hours. Lipid Profile No results for input(s): CHOL, HDL, LDLCALC, TRIG, CHOLHDL, LDLDIRECT in the last 72 hours. Thyroid function studies No results for input(s): TSH, T4TOTAL, T3FREE, THYROIDAB in the last 72 hours.  Invalid input(s): FREET3 Anemia work up Recent Labs    09/08/18 0337  RETICCTPCT 7.9*   Microbiology Recent Results (from the past 240 hour(s))  MRSA PCR Screening     Status: None   Collection Time: 08/30/18  1:06 AM   Result Value Ref Range Status   MRSA by PCR NEGATIVE NEGATIVE Final    Comment:        The GeneXpert MRSA Assay (FDA approved for NASAL specimens only), is one component of a comprehensive MRSA colonization surveillance program. It is not intended to diagnose MRSA infection nor to guide or monitor treatment for MRSA infections. Performed at [facility], [place] [address], [place], [place] [address]   Culture, blood (Routine x 2)     Status: None (Preliminary result)   Collection Time: 09/07/18  8:07 AM  Result Value Ref Range Status   Specimen Description   Final    BLOOD LEFT HAND Performed at [facility] [address], [place], [place] [address]    Special Requests   Final    Blood Culture adequate volume Performed at [facility] [address], [place], [place] [address]    Culture   Final    NO GROWTH 1 DAY Performed at [facility], [place] [address], [place], [place] [address]    Report Status PENDING  Incomplete  Culture, blood (Routine x 2)     Status: None (Preliminary result)   Collection Time: 09/07/18  8:18 AM  Result Value Ref Range Status   Specimen Description   Final    BLOOD RIGHT ANTECUBITAL Performed at [facility] [address], [place], [place] [address]    Special Requests   Final    BOTTLES DRAWN AEROBIC AND ANAEROBIC Blood Culture adequate volume Performed at [facility] [address] [place], [place] [address]    Culture   Final    NO GROWTH 1 DAY Performed at [facility], [place] [address], [place], [place] [address]    Report Status PENDING  Incomplete  Urine culture     Status: None   Collection Time: 09/07/18  8:22 AM  Result Value Ref Range Status   Specimen Description   Final    URINE, CLEAN CATCH Performed at [facility], [place] [address], [place], [place] [address]    Special Requests   Final    NONE Performed  at [facility], [place] [address], [place], [place] [address]    Culture   Final    NO GROWTH Performed at [facility], [place] [address], [place], [place] [address]    Report Status 09/08/2018 FINAL  Final      Studies:  Dg Chest 2 View  Result Date: 09/07/2018 CLINICAL DATA:  Increasing shortness of breath. Decreased oxygen saturation since yesterday. EXAM: CHEST - 2 VIEW COMPARISON:  Single-view of the chest and CT chest 08/29/2018. FINDINGS: There is cardiomegaly without edema. Moderate left pleural effusion and basilar atelectasis are seen. Pulmonary nodules in the left upper lobe and along the periphery of the left lung are identified as seen on prior CT. Small right pulmonary nodule seen on CT are difficult to visualize on this examination. Large hiatal hernia is noted. No pneumothorax. IMPRESSION: No change in a moderate left pleural effusion and left basilar atelectasis. Pulmonary nodules. Cardiomegaly without edema. Hiatal hernia. Electronically Signed   By: Inge Rise M.D.   On: 103-22-2019 09:02   Ct Head Wo Contrast  Result Date: 09/07/2018 CLINICAL DATA:  Sepsis. Recent diagnosis of suspected metastatic disease in chest abdomen and pelvis on CT studies. New left sided proptosis and left upper and lower extremity weakness. No reported injury. EXAM: CT HEAD WITHOUT CONTRAST TECHNIQUE: Contiguous axial images were obtained from the base of the skull through the vertex without intravenous contrast. COMPARISON:  07/15/2017 head CT. FINDINGS: Brain: No evidence of parenchymal hemorrhage or extra-axial fluid collection. No mass lesion, mass effect, or midline shift. No CT evidence of acute infarction. Nonspecific mild subcortical and periventricular white matter hypodensity, most in keeping with chronic small vessel ischemic change. Cerebral volume is age appropriate. No ventriculomegaly. Vascular: No acute abnormality. Skull: No evidence of calvarial fracture.  Sinuses/Orbits: No fluid levels. Mild mucoperiosteal thickening in the maxillary sinuses bilaterally. Other:  The mastoid air cells are unopacified. IMPRESSION: 1.  No evidence of acute intracranial abnormality. 2. Mild chronic small vessel ischemic changes in the cerebral white matter. 3. Mild chronic appearing paranasal sinusitis. Electronically Signed   By: Ilona Sorrel M.D.   On: 103-22-2019 12:06   Ct Angio Chest Pe W/cm &/or Wo Cm  Result Date: 09/07/2018 CLINICAL DATA:  Shortness of breath and weakness. 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 EXAM: CT ANGIOGRAPHY CHEST WITH CONTRAST TECHNIQUE: Multidetector CT imaging of the chest was performed using the standard protocol during bolus administration of intravenous contrast. Multiplanar CT image reconstructions and MIPs were obtained to evaluate the vascular anatomy. CONTRAST:  168mL ISOVUE-370 IOPAMIDOL (ISOVUE-370) INJECTION 76% COMPARISON:  CTA of the chest on 08/29/2018 FINDINGS: Cardiovascular: The pulmonary arteries are adequately opacified. There is no evidence of pulmonary embolism. The thoracic aorta is normal in caliber and demonstrates no evidence of aneurysm or dissection. Proximal great vessels are normally patent. The heart size is stable and at the upper limits of normal. Pacemaker shows stable positioning. No pericardial fluid identified. Mediastinum/Nodes: Massive confluent lymphadenopathy of the left axilla extending into the left subpectoral region and encasing the region of the brachial plexus, left axillary vessels and left subclavian vessels appears progressively enlarged since the prior CTA. Index lateral subpectoral lymph node measured previously at 2.8 cm in short axis now measures approximately 4.1 cm. More inferior left axillary node measured at 2.5 cm in short axis previously now measures 3.1 cm. Confluent lymph node mass encasement of vessels does not cause arterial occlusion but may be  causing significant mass effect on venous structures. The veins are not opacified on the study to determine whether any venous thrombus is present. Associated right hilar and mediastinal lymphadenopathy also appears slightly more prominent compared to the prior study. Largest area right hilar lymphadenopathy measures approximately 2.2 cm. Stable large hiatal hernia.  Lungs/Pleura: There also is progressive pulmonary involvement with lymphoma with definite progression since the prior CTA. All of the multiple bilateral pulmonary nodules show enlargement since the prior study. Index mass in the subpleural left lower lobe now measures approximately 2.2 x 3.7 cm (previously 1.7 x 3.3 cm). All other lesions also show enlargement. New small right pleural effusion. Small to moderate left pleural effusion appears stable since the prior study. Upper Abdomen: Stable splenomegaly. Musculoskeletal: No chest wall abnormality. No acute or significant osseous findings. Review of the MIP images confirms the above findings. IMPRESSION: 1. No evidence of pulmonary embolism. 2. Progression of lymphoma since the prior CTA on 11/25 with enlargement of left axillary, subpectoral and chest wall lymph node masses with encasement of left subclavian and axillary vascular structures. Enlargement of right hilar and mediastinal lymph nodes since the prior study. Pulmonary involvement also shows significant progression with enlargement of all of the bilateral pulmonary nodule seen previously. 3. Normal arterial patency demonstrated of the left subclavian and axillary arteries. Venous patency cannot be determined on the CTA study and the patient would be at high risk of left upper extremity DVT given the large bulky lymphadenopathy present. 4. New small right pleural effusion. Stable small to moderate left pleural effusion. 5. Stable splenomegaly. Electronically Signed   By: Aletta Edouard M.D.   On: 103/07/2018 12:29   [place] Ekg Site Rite  Result  Date: 09/08/2018 If Site Rite image not attached, placement could not be confirmed due to current cardiac rhythm.   Assessment: 72 y.o. with newly diagnosed high-grade B-cell lymphoma, recently discharged from hospital after biopsy, presented to the emergency room with progressive weakness, dyspnea, chills and fever, anorexia, and extremity edema.  1. Sepsis, with fever, chill, possible pneumonia  2.  Acute hypoxic respite failure, secondary to lymphoma in lungs, versus infections 3.  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  Third-degree AV block, status post pacemaker 6. Hypercalcemia secondary to malignancy, s/p zometa today  7.  Hyperbilirubinemia, secondary to hemolysis  8. Severe arm and leg edema, secondary to severe adenopathy and hypoalbuminemia 9. Deconditioning   Plan:  -Her anemia, she was overnight, lab evidence showed hemolysis (high reticular count, elevated indirect bilirubin, high LDH), Coombs test was positive for complement, supports autoimmune related hemolysis, likely secondary to underlying,.  I have started her on high-dose Solu-Medrol 500 mg every 12 hours today, for 1 to 2 days, then switch to prednisone (which is part of her chemo regimen). Add folic acid 1mg  daily  -Due to her significant hypercalcemia and mental status change, I ordered 1 dose of Zometa.  His lethargy improved after infusion  -She is on broad antibiotics, cultures so far negative. -I ordered a PICC line insertion for therapy.  Due to the pacemaker on the right chest, and significant left arm lymphedema, IV team was not comfortable to place a PIC on her right arm.  I discussed with interventional radiologist Dr. Annamaria Boots, and he will place a PICC in her right arm tomorrow morning. He does not recommend port due to the pacemaker on right chest wall, and  severe bulky adenopathy on the left axilla and up chest wall.  -plan to start Rituxan tomorrow, will check Uric acid, she is at high risk for tumor lysis. Will add allopurinol.  Hepatitis B S antigen results still pending. -plan to start first cycle  [facility] on Saturday. I have checked with her cardiologist Dr. Claiborne Billings who cleared her for Adriamycin from her cardiac standpoint -will f/u closely  -plan discussed with Dr. Alfredia Ferguson, chemo pharmacy and W6 charge nurse    Truitt Merle, MD 09/08/2018

## 2018-09-08 NOTE — Progress Notes (Signed)
CRITICAL VALUE ALERT  Critical Value:  Hgb 6.9  Date & Time Notied:  09-08-18 0635  Provider Notified: Lamar Blinks  Orders Received/Actions taken:

## 2018-09-08 NOTE — Telephone Encounter (Signed)
Left voice message with [facility] to request chemo education for this patient today.  Plan is to start tomorrow.

## 2018-09-08 NOTE — Progress Notes (Signed)
Received voicemail from Valda Favia regarding chemo teaching for [employer].  Called to [facility] [place] and spoke with Laural Benes, RN.  Kim or another chemotherapy competent nurse will be able to provide this patient education today prior to the patient starting treatment tomorrow.  Zandra Abts [facility]  09/08/2018  12:07 PM

## 2018-09-09 ENCOUNTER — Inpatient Hospital Stay (HOSPITAL_COMMUNITY): Payer: Medicare HMO

## 2018-09-09 LAB — FOLATE RBC
FOLATE, RBC: 1832 ng/mL (ref >498)
Folate, Hemolysate: 382.9 ng/mL
Hematocrit: 20.9 % — ABNORMAL LOW (ref 34.0–46.6)

## 2018-09-09 LAB — COMPREHENSIVE METABOLIC PANEL
ALT: 13 U/L (ref 0–44)
AST: 28 U/L (ref 15–41)
Albumin: 2.3 g/dL — ABNORMAL LOW (ref 3.5–5.0)
Alkaline Phosphatase: 66 U/L (ref 38–126)
Anion gap: 11 (ref 5–15)
BILIRUBIN TOTAL: 3.8 mg/dL — AB (ref 0.3–1.2)
BUN: 32 mg/dL — AB (ref 8–23)
CO2: 21 mmol/L — ABNORMAL LOW (ref 22–32)
Calcium: 11.9 mg/dL — ABNORMAL HIGH (ref 8.9–10.3)
Chloride: 103 mmol/L (ref 98–111)
Creatinine, Ser: 0.74 mg/dL (ref 0.44–1.00)
GFR calc Af Amer: >60 mL/min (ref >60)
GFR calc non Af Amer: >60 mL/min (ref >60)
Glucose, Bld: 155 mg/dL — ABNORMAL HIGH (ref 70–99)
Potassium: 4.3 mmol/L (ref 3.5–5.1)
Sodium: 135 mmol/L (ref 135–145)
Total Protein: 4.3 g/dL — ABNORMAL LOW (ref 6.5–8.1)

## 2018-09-09 LAB — TYPE AND SCREEN
ABO/RH(D): O POS
Antibody Screen: NEGATIVE
UNIT DIVISION: 0
Unit division: 0
Unit division: 0

## 2018-09-09 LAB — CBC WITH DIFFERENTIAL/PLATELET
Abs Immature Granulocytes: 0.14 10*3/uL — ABNORMAL HIGH (ref 0.00–0.07)
Basophils Absolute: 0 10*3/uL (ref 0.0–0.1)
Basophils Relative: 0 %
Eosinophils Absolute: 0 10*3/uL (ref 0.0–0.5)
Eosinophils Relative: 0 %
HCT: 28.8 % — ABNORMAL LOW (ref 36.0–46.0)
Hemoglobin: 8.5 g/dL — ABNORMAL LOW (ref 12.0–15.0)
Immature Granulocytes: 2 %
LYMPHS ABS: 0.2 10*3/uL — AB (ref 0.7–4.0)
Lymphocytes Relative: 2 %
MCH: 27.8 pg (ref 26.0–34.0)
MCHC: 29.5 g/dL — ABNORMAL LOW (ref 30.0–36.0)
MCV: 94.1 fL (ref 80.0–100.0)
MONOS PCT: 10 %
Monocytes Absolute: 0.9 10*3/uL (ref 0.1–1.0)
Neutro Abs: 8.3 10*3/uL — ABNORMAL HIGH (ref 1.7–7.7)
Neutrophils Relative %: 86 %
Platelets: 149 10*3/uL — ABNORMAL LOW (ref 150–400)
RBC: 3.06 MIL/uL — ABNORMAL LOW (ref 3.87–5.11)
RDW: 19.5 % — ABNORMAL HIGH (ref 11.5–15.5)
WBC: 9.6 10*3/uL (ref 4.0–10.5)
nRBC: 0 % (ref 0.0–0.2)

## 2018-09-09 LAB — BPAM RBC
Blood Product Expiration Date: 202001012359
Blood Product Expiration Date: 202001012359
Blood Product Expiration Date: 202001022359
ISSUE DATE / TIME: 201912051711
ISSUE DATE / TIME: 201912052045
ISSUE DATE / TIME: 201912052102
UNIT TYPE AND RH: 5100
Unit Type and Rh: 5100
Unit Type and Rh: 5100

## 2018-09-09 LAB — PHOSPHORUS: Phosphorus: 3.2 mg/dL (ref 2.5–4.6)

## 2018-09-09 LAB — URIC ACID: Uric Acid, Serum: 7.1 mg/dL (ref 2.5–7.1)

## 2018-09-09 LAB — MAGNESIUM: Magnesium: 1.9 mg/dL (ref 1.7–2.4)

## 2018-09-09 LAB — HAPTOGLOBIN: Haptoglobin: 71 mg/dL (ref 34–200)

## 2018-09-09 LAB — HEPATITIS B SURFACE ANTIGEN: Hepatitis B Surface Ag: NEGATIVE

## 2018-09-09 MED ORDER — PREDNISONE 20 MG PO TABS
60.0000 mg | ORAL_TABLET | Freq: Every day | ORAL | Status: AC
  Administered 2018-09-09 – 2018-09-13 (×5): 60 mg via ORAL
  Filled 2018-09-09 (×5): qty 3

## 2018-09-09 MED ORDER — LIDOCAINE HCL 1 % IJ SOLN
INTRAMUSCULAR | Status: AC
  Filled 2018-09-09: qty 20

## 2018-09-09 MED ORDER — SODIUM CHLORIDE 0.9 % IV SOLN
375.0000 mg/m2 | Freq: Once | INTRAVENOUS | Status: AC
  Administered 2018-09-09: 800 mg via INTRAVENOUS
  Filled 2018-09-09: qty 50

## 2018-09-09 MED ORDER — VINCRISTINE SULFATE CHEMO INJECTION 1 MG/ML
2.0000 mg | Freq: Once | INTRAVENOUS | Status: AC
  Administered 2018-09-10: 2 mg via INTRAVENOUS
  Filled 2018-09-09: qty 2

## 2018-09-09 MED ORDER — GUAIFENESIN-DM 100-10 MG/5ML PO SYRP
5.0000 mL | ORAL_SOLUTION | ORAL | Status: DC | PRN
  Administered 2018-09-18 – 2018-09-23 (×4): 5 mL via ORAL
  Filled 2018-09-09 (×4): qty 10

## 2018-09-09 MED ORDER — DIPHENHYDRAMINE HCL 50 MG PO CAPS
50.0000 mg | ORAL_CAPSULE | Freq: Once | ORAL | Status: AC
  Administered 2018-09-09: 50 mg via ORAL
  Filled 2018-09-09: qty 1

## 2018-09-09 MED ORDER — ACETAMINOPHEN 325 MG PO TABS
650.0000 mg | ORAL_TABLET | Freq: Once | ORAL | Status: AC
  Administered 2018-09-09: 650 mg via ORAL
  Filled 2018-09-09: qty 2

## 2018-09-09 MED ORDER — SODIUM CHLORIDE 0.9 % IV SOLN
INTRAVENOUS | Status: DC | PRN
  Administered 2018-09-09: 21:00:00 via INTRAVENOUS
  Administered 2018-09-12 – 2018-09-26 (×6): 250 mL via INTRAVENOUS

## 2018-09-09 MED ORDER — DOXORUBICIN HCL CHEMO IV INJECTION 2 MG/ML
50.0000 mg/m2 | Freq: Once | INTRAVENOUS | Status: AC
  Administered 2018-09-10: 102 mg via INTRAVENOUS
  Filled 2018-09-09: qty 51

## 2018-09-09 MED ORDER — SODIUM CHLORIDE 0.9 % IV SOLN
Freq: Once | INTRAVENOUS | Status: AC
  Administered 2018-09-10: 09:00:00 via INTRAVENOUS
  Filled 2018-09-09: qty 5

## 2018-09-09 MED ORDER — LIDOCAINE HCL 1 % IJ SOLN
INTRAMUSCULAR | Status: DC | PRN
  Administered 2018-09-09: 10 mL

## 2018-09-09 MED ORDER — SODIUM CHLORIDE 0.9 % IV SOLN
750.0000 mg/m2 | Freq: Once | INTRAVENOUS | Status: AC
  Administered 2018-09-10: 1540 mg via INTRAVENOUS
  Filled 2018-09-09: qty 77

## 2018-09-09 MED ORDER — PALONOSETRON HCL INJECTION 0.25 MG/5ML
0.2500 mg | Freq: Once | INTRAVENOUS | Status: AC
  Administered 2018-09-10: 0.25 mg via INTRAVENOUS
  Filled 2018-09-09 (×2): qty 5

## 2018-09-09 NOTE — Progress Notes (Signed)
PROGRESS NOTE    Patricia Murillo  CHY:[id number] DOB: 31-Jan-1946 DOA: 09/07/2018 PCP: Patient, No Pcp Per  Brief Narrative:  HPI per Dr. Riccardo Dubin Arrien on 09/07/18  Patricia Murillo is a 72 y.o. 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  During her hospitalization she underwent left axillary lymph node biopsy.  She was discharged home in a stable condition, but apparently she has been developing worsening weakness, generalized, worse with exertion, no improving factors, associated with malaise, chills and poor appetite.  Over the last 24 hours her symptoms have been more severe to the point where she has difficulty standing. She has persistent left upper extremity edema which has been painful, sharp in nature up to 10 out of 10 intensity.  Her weakness has led to dyspnea which has been severe in intensity, and triggered her emergency room visit today.  ED Course: Patient was found ill looking appearing, febrile, positive leukocytosis, concern for systemic infection, referred for admission for evaluation.  **Oncology concerned about her progression of her high-grade B-cell lymphoma and is planning on starting chemotherapy today and have ordered a PICC line placement which was done by IR Dr. Annamaria Boots.  Sepsis physiology is improved.  Patient still remains hypercalcemic so oncology gave the patient Zometa and continue with IV fluids for now. She is supposed to start Rituxan today.   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  Likely related to progressive high-grade B-cell lymphoma with pulmonary  involvement -At this point unable to rule out an overt bacterial pneumonia (left lower lobe), leading to sepsis syndrome but ? If patient is really septic. WBC is normal now  (14.7 -> 9.6) and she had a mild temp of 100.5 which has resovled; ? If Leukocytosis was from dehydration  -Continue oximetry monitoring and supplemental oxygen per nasal cannula,  -IV fluids with isotonic saline to D5 normal saline rate of 75 mL's per hour -IV antibiotic therapy with IV ceftriaxone and IV Metronidazole was started. Will continue IV Ceftriaxone for now and D/C'd IV Flagyl  -If in 24 hours no further evidence of pulmonary infection antibiotic therapy can be discontinued but will wait to De-escalate -Continue to Follow-up on cell count, cultures and temperature curve. -Wean O2 as Tolerated   High-grade B-cell lymphoma -Continue supportive medical therapy -IV analgesics for left upper extremity pain; Head CT r/o  -Oncology consulted and recommending Starting R-CHOP and patient to get Rituxan and CHOP on different days -Oncology Dr. Burr Medico does not recommend a bone marrow biopsy as this would not change restaging imaging and management -Dr. Burr Medico has ordered FISH to rule out double hit or triple hit high-grade lymphoma -Patient to have PICC line placed tomorrow by IR start initiation of chemotherapy -Oncology gave the patient Solu-Medrol 500 mg every 12 for 2 doses today given her Hemoolysis -Rituxan to be initiated today and CHOP tomorrow -Pain Control with Acetaminophen, Hydrocodone-Acetaminophen 1 tab po q6hprn, and IV Morphine 2 mg q2hprn  Acute encephalopathy likely in the setting of hypercalcemia, improving -Stable and improving  -Continue monitor -Head CT noncontrast showed no evidence of any acute intracranial abnormality.  There is  mild chronic small vessel ischemic changes in the cerebral white matter.  There is also mild chronic appearing paranasal sinusitis. -If not improving and calcium level is  improved we will obtain an MR  Hypercalcemia likely of Malignancy -Calcium level was 11.9 today and corrected calcium likely higher than that due to albumin level -Tinea with D5 normal saline at rate of 75 mL's per hour -Patient was given zoledronic acid IV 4 mg once yesterday per oncology  Iron deficiency anemia/Hemolysis from [place] -Patient's hemoglobin/hematocrit went from 8.2/27.5 is now 6.9/24.2 -Continue with Ferrous Gluconate 324 mg po BID -Type and screen and transfuse 2 units of irradiated blood; Blood Count now shows Hb/Hct of 8.5/28.8 -Patient's LDH was 4.12 and T Bil was 2.0 and worsened to 3.8 -Continue to Monitor for S/Sx of Bleeding  -Patient was started on IV Solumedrol 500 mg po q12h by Oncology and will be placed on Steroids now  -Repeat CBC in AM   Third-degree AV block s/p pacemaker -Patient currently has a V paced rhythm -Continue telemetry monitoring.  Obesity -Estimated body mass index is 31.24 kg/m as calculated from the following:   Height as of this encounter: _0  (1.676 m).   Weight as of this encounter: 87.8 kg. -Weight Loss Counseling given  Non-Severe (moderate) malnutrition in the context of acute illness/injury -Nutrition is consulted for further evaluation recommendations -Continue with Ensure Enlive p.o. twice daily  Left Arm Weakness -Possibly secondary to high-grade lymphoma -Continue monitor and may need an MRI to rule out strokelike symptoms but will hold off for now  Pupil Discrepancy -Noted to be new -No Vision loss -Head CT Negative -Continue to Monitor   DVT prophylaxis: Enoxaparin 40 mg sq q24h Code Status: FULL CODE Family Communication: Discussed with Daughter at bedside Disposition Plan: Remain Inpatient for Chemotherapy   Consultants:   Medical Oncology   Procedures:  PICC Line  R-CHOP Chemo to be initiated    Antimicrobials:  Anti-infectives (From admission, onward)   Start     Dose/Rate Route Frequency  Ordered Stop   09/07/18 2000  cefTRIAXone (ROCEPHIN) 1 g in sodium chloride 0.9 % 100 mL IVPB     1 g 200 mL/hr over 30 Minutes Intravenous Every 24 hours 09/07/18 1844     09/07/18 0830  ceFEPIme (MAXIPIME) 2 g in sodium chloride 0.9 % 100 mL IVPB     2 g 200 mL/hr over 30 Minutes Intravenous  Once 09/07/18 0820 09/07/18 0912   09/07/18 0830  metroNIDAZOLE (FLAGYL) IVPB 500 mg  Status:  Discontinued     500 mg 100 mL/hr over 60 Minutes Intravenous Every 8 hours 09/07/18 0820 09/08/18 1812   09/07/18 0830  vancomycin (VANCOCIN) IVPB 1000 mg/200 mL premix     1,000 mg 200 mL/hr over 60 Minutes Intravenous  Once 09/07/18 0820 09/07/18 1157     Subjective: Seen and examined at bedside and more awake and had improved breathing.  She applied a permanent oxygen via nasal cannula and ahead to drink coffee.  No nausea or vomiting.  States left arm is weak and very puffy and swollen.  Lower legs are also swollen but family thinks her pedal edema has improved some.  No other concerns or complaints at this time.  When asked about her pupil discrepancy she states she has no problem with her vision and family had not noticed it before.  Objective: Vitals:   09/09/18 1536 09/09/18 1600 09/09/18 1607 09/09/18 1638  BP: (!) 148/66 (!) 155/67 Marland Kitchen)  157/66 (!) 160/66  Pulse:  81 79 86  Resp:   17   Temp: 98.4 F (36.9 C) 98 F (36.7 C) 98.1 F (36.7 C) 97.8 F (36.6 C)  TempSrc: Axillary Axillary Oral Axillary  SpO2: 96% 97% 97% 97%  Weight:      Height:        Intake/Output Summary (Last 24 hours) at 09/09/2018 1702 Last data filed at 09/09/2018 0450 Gross per 24 hour  Intake 2135.32 ml  Output 450 ml  Net 1685.32 ml   Filed Weights   09/08/18 0505 09/09/18 0500  Weight: 89.3 kg 87.8 kg   Examination: Physical Exam:  Constitutional: Well-nourished, well-developed obese Caucasian female currently no acute distress appears calm and she is more awake alert today Eyes: Lids and conjunctive  are normal.  Sclera anicteric ENMT: External ears and nose appear normal.  Grossly normal hearing Neck: Appears supple no JVD Respiratory: Slightly diminished to auscultation bilaterally with mild coarse breath sounds.  No appreciable wheezing, rales, rhonchi.  Patient not tachypneic wheezing excess muscle breathe Cardiovascular: Regular rate and rhythm.  No appreciable murmurs, rubs or gallops.  Has 1-2+ lower extremity edema bilaterally and 2+ upper extremity edema left arm Abdomen: Soft, nontender, distended secondary body habitus.  Bowel sounds present in 4 quadrants GU: Deferred Musculoskeletal: No appreciable contractures or cyanosis. Skin: Skin is warm and dry.  No appreciable rashes or lesions limited skin evaluation Neurologic: Cranial nerves II through XII grossly intact no appreciable focal deficits.  Left pupil is smaller than her right pupil.  Romberg sign and cerebellar reflexes were not assessed.  Has left arm weakness but is extremely swollen. Psychiatric: Normal judgment and insight.  Patient is not as somnolent and more alert today  Data Reviewed: I have personally reviewed following labs and imaging studies  CBC: Recent Labs  Lab 09/07/18 0807 09/08/18 0337 09/09/18 0428  WBC 14.7* 9.8 9.6  NEUTROABS 11.8*  --  8.3*  HGB 8.2* 6.9* 8.5*  HCT 27.5* 24.0*  20.9* 28.8*  MCV 92.6 93.8 94.1  PLT 260 195 381*   Basic Metabolic Panel: Recent Labs  Lab 09/07/18 0807 09/08/18 0337 09/09/18 0428  NA 133* 135 135  K 4.4 4.2 4.3  CL 96* 101 103  CO2 25 24 21*  GLUCOSE 114* 104* 155*  BUN 30* 29* 32*  CREATININE 0.82 0.80 0.74  CALCIUM 12.5* 12.2* 11.9*  MG  --   --  1.9  PHOS  --   --  3.2   GFR: Estimated Creatinine Clearance: 72 mL/min (by C-G formula based on SCr of 0.74 mg/dL). Liver Function Tests: Recent Labs  Lab 09/07/18 0807 09/08/18 0337 09/09/18 0428  AST 24  --  28  ALT 13  --  13  ALKPHOS 76  --  66  BILITOT 3.4* 2.0* 3.8*  PROT 4.9*  --   4.3*  ALBUMIN 2.5*  --  2.3*   No results for input(s): LIPASE, AMYLASE in the last 168 hours. No results for input(s): AMMONIA in the last 168 hours. Coagulation Profile: Recent Labs  Lab 09/07/18 0807  INR 1.23   Cardiac Enzymes: No results for input(s): CKTOTAL, CKMB, CKMBINDEX, TROPONINI in the last 168 hours. BNP (last 3 results) No results for input(s): PROBNP in the last 8760 hours. HbA1C: No results for input(s): HGBA1C in the last 72 hours. CBG: No results for input(s): GLUCAP in the last 168 hours. Lipid Profile: No results for input(s): CHOL, HDL, LDLCALC, TRIG, CHOLHDL, LDLDIRECT  in the last 72 hours. Thyroid Function Tests: No results for input(s): TSH, T4TOTAL, FREET4, T3FREE, THYROIDAB in the last 72 hours. Anemia Panel: Recent Labs    09/08/18 0337  RETICCTPCT 7.9*   Sepsis Labs: Recent Labs  Lab 09/07/18 0817 09/07/18 1056  LATICACIDVEN 2.84* 2.32*    Recent Results (from the past 240 hour(s))  Culture, blood (Routine x 2)     Status: None (Preliminary result)   Collection Time: 09/07/18  8:07 AM  Result Value Ref Range Status   Specimen Description   Final    BLOOD LEFT HAND Performed at [facility] [address] [place], [place] [address]    Special Requests   Final    Blood Culture adequate volume BOTTLES DRAWN AEROBIC AND ANAEROBIC   Culture   Final    NO GROWTH 2 DAYS Performed at [facility], [place] [address] [place], [place] [address]    Report Status PENDING  Incomplete  Culture, blood (Routine x 2)     Status: None (Preliminary result)   Collection Time: 09/07/18  8:18 AM  Result Value Ref Range Status   Specimen Description   Final    BLOOD RIGHT ANTECUBITAL Performed at [facility] [address] [place], [place] [address]    Special Requests   Final    BOTTLES DRAWN AEROBIC AND ANAEROBIC Blood Culture adequate volume Performed at [facility] [address] [place], [place] [address]    Culture   Final    NO GROWTH 2 DAYS Performed at [facility] [address] [place], [place] [address]    Report Status PENDING  Incomplete  Urine culture     Status: None   Collection Time: 09/07/18  8:22 AM  Result Value Ref Range Status   Specimen Description   Final    URINE, CLEAN CATCH Performed at [facility], [place] [address] [place], [place] [address]    Special Requests   Final    NONE Performed at [facility], [place] [address] [place], [place] [address]    Culture   Final    NO GROWTH Performed at [facility], [place] [address], [place], [place] [address]    Report Status 09/08/2018 FINAL  Final    Radiology Studies: Dg Chest Port 1 View  Result Date: 09/09/2018 CLINICAL DATA:  Hypoxia, history of progressive lymphoma EXAM: PORTABLE CHEST 1 VIEW COMPARISON:  [id number] FINDINGS: Cardiac shadow is enlarged. Pacing device is again seen. Bilateral pleural effusions are again noted. The nodular changes seen on recent chest CT are not as well appreciated on today's exam. No pneumothorax is noted. IMPRESSION: Bilateral effusions. Patchy densities are noted consistent findings of recent CT although less well appreciated on today's exam. Electronically Signed   By: Inez Catalina M.D.   On: 09/09/2018 06:35   Ir Picc Placement Right >5 Yrs Inc Img Guide  Result Date: 09/09/2018 INDICATION: Acute lymphoma, access for chemotherapy as an inpatient EXAM: ULTRASOUND AND FLUOROSCOPIC GUIDED PICC LINE INSERTION MEDICATIONS: 1% lidocaine local CONTRAST:  None FLUOROSCOPY TIME:  Twenty-four seconds (8 mGy) COMPLICATIONS: None immediate. TECHNIQUE: The procedure, risks, benefits, and alternatives were explained to the patient and informed written consent was obtained. A timeout was performed prior to the initiation of the procedure. The right upper extremity was prepped with chlorhexidine in a sterile fashion, and  a sterile drape was applied covering the operative field. Maximum  barrier sterile technique with sterile gowns and gloves were used for the procedure. A timeout was performed prior to the initiation of the procedure. Local anesthesia was provided with 1% lidocaine. Under direct ultrasound guidance, the right brachial vein was accessed with a micropuncture kit after the overlying soft tissues were anesthetized with 1% lidocaine. An ultrasound image was saved for documentation purposes. A guidewire was advanced to the level of the superior caval-atrial junction for measurement purposes and the PICC line was cut to length. A peel-away sheath was placed and a 36 cm, 5 [place], dual lumen was inserted to level of the superior caval-atrial junction. A post procedure spot fluoroscopic was obtained. The catheter easily aspirated and flushed and was sutured in place. A dressing was placed. The patient tolerated the procedure well without immediate post procedural complication. FINDINGS: After catheter placement, the tip lies within the superior cavoatrial junction. The catheter aspirates and flushes normally and is ready for immediate use. IMPRESSION: Successful ultrasound and fluoroscopic guided placement of a right brachial vein approach, 36 cm, 5 French, dual lumen PICC with tip at the superior caval-atrial junction. The PICC line is ready for immediate use. Electronically Signed   By: Jerilynn Mages.  Shick M.D.   On: 09/09/2018 09:23   [place] Ekg Site Rite  Result Date: 09/08/2018 If Site Rite image not attached, placement could not be confirmed due to current cardiac rhythm.  Scheduled Meds: . allopurinol  300 mg Oral Daily  . cholecalciferol  2,000 Units Oral Daily  . [START ON 09/10/2018] cyclophosphamide  750 mg/m2 (Treatment Plan Recorded) Intravenous Once  . [START ON 09/10/2018] DOXOrubicin  50 mg/m2 (Treatment Plan Recorded) Intravenous Once  . enoxaparin (LOVENOX) injection  40 mg Subcutaneous Q24H  . feeding  supplement (ENSURE ENLIVE)  237 mL Oral BID BM  . ferrous gluconate  324 mg Oral BID WC  . folic acid  1 mg Oral Daily  . lidocaine      . mouth rinse  15 mL Mouth Rinse BID  . [START ON 09/10/2018] palonosetron  0.25 mg Intravenous Once  . predniSONE  60 mg Oral Q breakfast  . [START ON 09/10/2018] vinCRIStine (ONCOVIN) CHEMO IV infusion  2 mg Intravenous Once   Continuous Infusions: . cefTRIAXone (ROCEPHIN)  IV Stopped (09/08/18 2048)  . dextrose 5 % and 0.9% NaCl 75 mL/hr at 09/09/18 0300  . [START ON 09/10/2018] fosaprepitant (EMEND) 150 mg + dexamethasone IV infusion      LOS: 2 days   Kerney Elbe, DO [facility] PAGER is on AMION  If 7PM-7AM, please contact night-coverage [email or web address] Password [email or web address] 09/09/2018, 5:02 PM

## 2018-09-09 NOTE — Procedures (Signed)
Acute lymphoma  S/p RUE DL POWER PICC  Tip svcra No comp Stable EBL 0 Ready for use

## 2018-09-09 NOTE — Progress Notes (Signed)
Chemotherapy teaching completed with patient and daughter Patricia Murillo.  Medications and side effects of chemotherapy discussed with both of them and they both verbalized understanding without any immediate questions.  Patients learning limited by fatigue however she asked appropriate questions and asked that her daughter sign the consent for her.  Consent placed on chart. Laural Benes, RN notified that chemotherapy teaching has been completed.

## 2018-09-09 NOTE — Progress Notes (Signed)
Pt noted to be tachypniec during Rituxan infusion. VS obtained and MD on call notified. Orders to stop infusion and administer Tylenol and Benadryl. Will continue to monitor patient and notify oncoming RN.

## 2018-09-09 NOTE — Progress Notes (Signed)
Patricia Murillo   DOB:[birth year]   SW#:[id number]   [phone]  Hem/Onc follow up note   Subjective: Pt appears to be more alert today, had PICC line placed this morning, and the plan to start Rituxan infusion this afternoon.  Anemia much improved after blood transfusion, , VS stable except hypertension, afebrile, cultures have been negative.   Objective:  Vitals:   09/09/18 1826 09/09/18 2039  BP: (!) 170/75 (!) 151/76  Pulse: (!) 104 94  Resp: (!) 24 20  Temp:  98 F (36.7 C)  SpO2: 99% 97%    Body mass index is 31.24 kg/m.  Intake/Output Summary (Last 24 hours) at 09/09/2018 2134 Last data filed at 09/09/2018 1837 Gross per 24 hour  Intake 2073.65 ml  Output 750 ml  Net 1323.65 ml     Sclerae unicteric  Oropharynx clear  (+) bulky adenopathy at left axilla   Lungs clear -- scatter wheezing and rhonchi through both lungs   Heart regular rate and rhythm  Abdomen benign, soft   MSK no focal spinal tenderness, (+) severe edema of the left upper arm and leg, and bilateral lower extremity  Neuro: alert, and follow commands. Pupil right bigger than left, both reactive, mild (+) ptosis of left eye. Her left upper extremity strength is significantly reduced. She moves all extremities    CBG (last 3)  No results for input(s): GLUCAP in the last 72 hours.   Labs:  Lab Results  Component Value Date   WBC 9.6 09/09/2018   HGB 8.5 (L) 09/09/2018   HCT 28.8 (L) 09/09/2018   MCV 94.1 09/09/2018   PLT 149 (L) 09/09/2018   NEUTROABS 8.3 (H) 09/09/2018   CMP Latest Ref Rng & Units 09/09/2018 09/08/2018 09/07/2018  Glucose 70 - 99 mg/dL 155(H) 104(H) 114(H)  BUN 8 - 23 mg/dL 32(H) 29(H) 30(H)  Creatinine 0.44 - 1.00 mg/dL 0.74 0.80 0.82  Sodium 135 - 145 mmol/L 135 135 133(L)  Potassium 3.5 - 5.1 mmol/L 4.3 4.2 4.4  Chloride 98 - 111 mmol/L 103 101 96(L)  CO2 22 - 32 mmol/L 21(L) 24 25  Calcium 8.9 - 10.3 mg/dL 11.9(H) 12.2(H) 12.5(H)  Total Protein 6.5 - 8.1 g/dL 4.3(L) -  4.9(L)  Total Bilirubin 0.3 - 1.2 mg/dL 3.8(H) 2.0(H) 3.4(H)  Alkaline Phos 38 - 126 U/L 66 - 76  AST 15 - 41 U/L 28 - 24  ALT 0 - 44 U/L 13 - 13     Urine Studies No results for input(s): UHGB, CRYS in the last 72 hours.  Invalid input(s): UACOL, UAPR, USPG, UPH, UTP, UGL, UKET, UBIL, UNIT, UROB, [place], UEPI, UWBC, Duwayne Heck [place], [place]  Basic Metabolic Panel: Recent Labs  Lab 09/07/18 0807 09/08/18 0337 09/09/18 0428  NA 133* 135 135  K 4.4 4.2 4.3  CL 96* 101 103  CO2 25 24 21*  GLUCOSE 114* 104* 155*  BUN 30* 29* 32*  CREATININE 0.82 0.80 0.74  CALCIUM 12.5* 12.2* 11.9*  MG  --   --  1.9  PHOS  --   --  3.2   GFR Estimated Creatinine Clearance: 72 mL/min (by C-G formula based on SCr of 0.74 mg/dL). Liver Function Tests: Recent Labs  Lab 09/07/18 0807 09/08/18 0337 09/09/18 0428  AST 24  --  28  ALT 13  --  13  ALKPHOS 76  --  66  BILITOT 3.4* 2.0* 3.8*  PROT 4.9*  --  4.3*  ALBUMIN 2.5*  --  2.3*   No results for input(s): LIPASE, AMYLASE in the last 168 hours. No results for input(s): AMMONIA in the last 168 hours. Coagulation profile Recent Labs  Lab 09/07/18 0807  INR 1.23    CBC: Recent Labs  Lab 09/07/18 0807 09/08/18 0337 09/09/18 0428  WBC 14.7* 9.8 9.6  NEUTROABS 11.8*  --  8.3*  HGB 8.2* 6.9* 8.5*  HCT 27.5* 24.0*  20.9* 28.8*  MCV 92.6 93.8 94.1  PLT 260 195 149*   Cardiac Enzymes: No results for input(s): CKTOTAL, CKMB, CKMBINDEX, TROPONINI in the last 168 hours. BNP: Invalid input(s): POCBNP CBG: No results for input(s): GLUCAP in the last 168 hours. D-Dimer No results for input(s): DDIMER in the last 72 hours. Hgb A1c No results for input(s): HGBA1C in the last 72 hours. Lipid Profile No results for input(s): CHOL, HDL, LDLCALC, TRIG, CHOLHDL, LDLDIRECT in the last 72 hours. Thyroid function studies No results for input(s): TSH, T4TOTAL, T3FREE, THYROIDAB in the last 72 hours.  Invalid input(s): FREET3 Anemia  work up Recent Labs    09/08/18 0337  RETICCTPCT 7.9*   Microbiology Recent Results (from the past 240 hour(s))  Culture, blood (Routine x 2)     Status: None (Preliminary result)   Collection Time: 09/07/18  8:07 AM  Result Value Ref Range Status   Specimen Description   Final    BLOOD LEFT HAND Performed at [facility], [place] [address], [place], [place] [address]    Special Requests   Final    Blood Culture adequate volume BOTTLES DRAWN AEROBIC AND ANAEROBIC   Culture   Final    NO GROWTH 2 DAYS Performed at [facility], [place] [address] [place], [place] [address]    Report Status PENDING  Incomplete  Culture, blood (Routine x 2)     Status: None (Preliminary result)   Collection Time: 09/07/18  8:18 AM  Result Value Ref Range Status   Specimen Description   Final    BLOOD RIGHT ANTECUBITAL Performed at [place] [address], [place], [place] [address]    Special Requests   Final    BOTTLES DRAWN AEROBIC AND ANAEROBIC Blood Culture adequate volume Performed at [place] [address] [place], [place] [address]    Culture   Final    NO GROWTH 2 DAYS Performed at [place] [address], [place], [place] [address]    Report Status PENDING  Incomplete  Urine culture     Status: None   Collection Time: 09/07/18  8:22 AM  Result Value Ref Range Status   Specimen Description   Final    URINE, CLEAN CATCH Performed at [facility], [place] [address], [place], [place] [address]    Special Requests   Final    NONE Performed at [facility], [place] [address], [place], [place] [address]    Culture   Final    NO GROWTH Performed at [facility], [place] [address], [place], [place] [address]    Report Status 09/08/2018 FINAL  Final      Studies:  Dg Chest Port 1 View  Result Date: 09/09/2018 CLINICAL DATA:  Hypoxia, history of progressive  lymphoma EXAM: PORTABLE CHEST 1 VIEW COMPARISON:  102-17-2019 FINDINGS: Cardiac shadow is enlarged. Pacing device is again seen. Bilateral pleural effusions are again noted. The nodular changes seen on recent chest CT are not as well appreciated on  today's exam. No pneumothorax is noted. IMPRESSION: Bilateral effusions. Patchy densities are noted consistent findings of recent CT although less well appreciated on today's exam. Electronically Signed   By: Inez Catalina M.D.   On: 09/09/2018 06:35   Ir Picc Placement Right >5 Yrs Inc Img Guide  Result Date: 09/09/2018 INDICATION: Acute lymphoma, access for chemotherapy as an inpatient EXAM: ULTRASOUND AND FLUOROSCOPIC GUIDED PICC LINE INSERTION MEDICATIONS: 1% lidocaine local CONTRAST:  None FLUOROSCOPY TIME:  Twenty-four seconds (8 mGy) COMPLICATIONS: None immediate. TECHNIQUE: The procedure, risks, benefits, and alternatives were explained to the patient and informed written consent was obtained. A timeout was performed prior to the initiation of the procedure. The right upper extremity was prepped with chlorhexidine in a sterile fashion, and a sterile drape was applied covering the operative field. Maximum barrier sterile technique with sterile gowns and gloves were used for the procedure. A timeout was performed prior to the initiation of the procedure. Local anesthesia was provided with 1% lidocaine. Under direct ultrasound guidance, the right brachial vein was accessed with a micropuncture kit after the overlying soft tissues were anesthetized with 1% lidocaine. An ultrasound image was saved for documentation purposes. A guidewire was advanced to the level of the superior caval-atrial junction for measurement purposes and the PICC line was cut to length. A peel-away sheath was placed and a 36 cm, 5 [place], dual lumen was inserted to level of the superior caval-atrial junction. A post procedure spot fluoroscopic was obtained. The catheter easily aspirated and  flushed and was sutured in place. A dressing was placed. The patient tolerated the procedure well without immediate post procedural complication. FINDINGS: After catheter placement, the tip lies within the superior cavoatrial junction. The catheter aspirates and flushes normally and is ready for immediate use. IMPRESSION: Successful ultrasound and fluoroscopic guided placement of a right brachial vein approach, 36 cm, 5 French, dual lumen PICC with tip at the superior caval-atrial junction. The PICC line is ready for immediate use. Electronically Signed   By: Jerilynn Mages.  Shick M.D.   On: 09/09/2018 09:23   [place] Ekg Site Rite  Result Date: 09/08/2018 If Site Rite image not attached, placement could not be confirmed due to current cardiac rhythm.   Assessment: 72 y.o. with newly diagnosed high-grade B-cell lymphoma, recently discharged from hospital after biopsy, presented to the emergency room with progressive weakness, dyspnea, chills and fever, anorexia, and extremity edema.  1. Sepsis, with fever, chill, possible pneumonia, improving  2.  Acute hypoxic respite failure, secondary to lymphoma in lungs, versus infections 3.  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 Hypercalcemia secondary to malignancy, s/p zometa 12/5 7.  Hyperbilirubinemia, secondary to hemolysis  8. Severe arm and leg edema, secondary to severe adenopathy and hypoalbuminemia 9. Deconditioning  10 left ptosis and dilated right pupil   Plan:  -morning lab reviewed, including her culture results, adequate for treatment, will start Rituxan this afternoon, plan to give [facility]OP tomorrow -She received high-dose Solu-Medrol 500 mg twice daily yesterday, for her hemolytic anemia.  Will change to prednisone 60 mg  daily for 5 days per CHOP regimen, may need more steroids if she has persistent hemolysis after chemo  -please consider blood transfusion if Hb<8.0, with irradiated blood  -monitor CBC, CMP, uric acid and  LDH daily  -may need brain MRI for her abnormal neuro findings, she did have CT brain wo contrast in ED which was negative. Pt's daughter states she has had left ptosis for a while but worse lately  -she may need diuretics, given the blood transfusion, chemo fluids and positive I/O, will defer to primary team  -My colleague Dr. Alvy Bimler will see her through the weekend, I will return on Monday  -I spoke with Dr. Alfredia Ferguson and her nurse today, update her daughters at bed side.  Truitt Merle  09/09/2018

## 2018-09-09 NOTE — Progress Notes (Signed)
Rituxan dosage and calculations verified with Laural Benes, RN.

## 2018-09-10 DIAGNOSIS — C8330 Diffuse large B-cell lymphoma, unspecified site: Secondary | ICD-10-CM

## 2018-09-10 DIAGNOSIS — D696 Thrombocytopenia, unspecified: Secondary | ICD-10-CM

## 2018-09-10 DIAGNOSIS — K5900 Constipation, unspecified: Secondary | ICD-10-CM

## 2018-09-10 DIAGNOSIS — D589 Hereditary hemolytic anemia, unspecified: Secondary | ICD-10-CM

## 2018-09-10 DIAGNOSIS — E8770 Fluid overload, unspecified: Secondary | ICD-10-CM

## 2018-09-10 LAB — CBC WITH DIFFERENTIAL/PLATELET
Abs Immature Granulocytes: 0.14 10*3/uL — ABNORMAL HIGH (ref 0.00–0.07)
BASOS ABS: 0 10*3/uL (ref 0.0–0.1)
Basophils Relative: 0 %
Eosinophils Absolute: 0 10*3/uL (ref 0.0–0.5)
Eosinophils Relative: 0 %
HCT: 30.1 % — ABNORMAL LOW (ref 36.0–46.0)
Hemoglobin: 8.9 g/dL — ABNORMAL LOW (ref 12.0–15.0)
Immature Granulocytes: 1 %
Lymphocytes Relative: 1 %
Lymphs Abs: 0.1 10*3/uL — ABNORMAL LOW (ref 0.7–4.0)
MCH: 27.6 pg (ref 26.0–34.0)
MCHC: 29.6 g/dL — ABNORMAL LOW (ref 30.0–36.0)
MCV: 93.2 fL (ref 80.0–100.0)
Monocytes Absolute: 0.7 10*3/uL (ref 0.1–1.0)
Monocytes Relative: 6 %
Neutro Abs: 11 10*3/uL — ABNORMAL HIGH (ref 1.7–7.7)
Neutrophils Relative %: 92 %
PLATELETS: 120 10*3/uL — AB (ref 150–400)
RBC: 3.23 MIL/uL — ABNORMAL LOW (ref 3.87–5.11)
RDW: 20.4 % — ABNORMAL HIGH (ref 11.5–15.5)
WBC: 12 10*3/uL — ABNORMAL HIGH (ref 4.0–10.5)
nRBC: 0 % (ref 0.0–0.2)

## 2018-09-10 LAB — COMPREHENSIVE METABOLIC PANEL
ALBUMIN: 2.4 g/dL — AB (ref 3.5–5.0)
ALT: 15 U/L (ref 0–44)
AST: 24 U/L (ref 15–41)
Alkaline Phosphatase: 73 U/L (ref 38–126)
Anion gap: 11 (ref 5–15)
BUN: 47 mg/dL — ABNORMAL HIGH (ref 8–23)
CO2: 23 mmol/L (ref 22–32)
Calcium: 12 mg/dL — ABNORMAL HIGH (ref 8.9–10.3)
Chloride: 103 mmol/L (ref 98–111)
Creatinine, Ser: 0.94 mg/dL (ref 0.44–1.00)
GFR calc Af Amer: >60 mL/min (ref >60)
GFR calc non Af Amer: >60 mL/min (ref >60)
Glucose, Bld: 101 mg/dL — ABNORMAL HIGH (ref 70–99)
Potassium: 4.5 mmol/L (ref 3.5–5.1)
Sodium: 137 mmol/L (ref 135–145)
Total Bilirubin: 2.4 mg/dL — ABNORMAL HIGH (ref 0.3–1.2)
Total Protein: 4.3 g/dL — ABNORMAL LOW (ref 6.5–8.1)

## 2018-09-10 LAB — URIC ACID: Uric Acid, Serum: 7.5 mg/dL — ABNORMAL HIGH (ref 2.5–7.1)

## 2018-09-10 LAB — PHOSPHORUS: Phosphorus: 3.3 mg/dL (ref 2.5–4.6)

## 2018-09-10 LAB — MAGNESIUM: Magnesium: 2 mg/dL (ref 1.7–2.4)

## 2018-09-10 MED ORDER — ACETAMINOPHEN 500 MG PO TABS
1000.0000 mg | ORAL_TABLET | Freq: Once | ORAL | Status: AC
  Administered 2018-09-10: 1000 mg via ORAL
  Filled 2018-09-10: qty 2

## 2018-09-10 MED ORDER — POLYETHYLENE GLYCOL 3350 17 G PO PACK
17.0000 g | PACK | Freq: Every day | ORAL | Status: DC
  Administered 2018-09-10 – 2018-09-29 (×9): 17 g via ORAL
  Filled 2018-09-10 (×14): qty 1

## 2018-09-10 MED ORDER — FUROSEMIDE 10 MG/ML IJ SOLN
20.0000 mg | Freq: Once | INTRAMUSCULAR | Status: AC
  Administered 2018-09-10: 20 mg via INTRAVENOUS
  Filled 2018-09-10: qty 2

## 2018-09-10 MED ORDER — ACYCLOVIR 200 MG PO CAPS
400.0000 mg | ORAL_CAPSULE | Freq: Two times a day (BID) | ORAL | Status: DC
  Administered 2018-09-10 – 2018-10-05 (×51): 400 mg via ORAL
  Filled 2018-09-10 (×53): qty 2

## 2018-09-10 MED ORDER — DIPHENHYDRAMINE HCL 25 MG PO CAPS
25.0000 mg | ORAL_CAPSULE | Freq: Once | ORAL | Status: AC
  Administered 2018-09-10: 25 mg via ORAL
  Filled 2018-09-10: qty 1

## 2018-09-10 MED ORDER — SODIUM CHLORIDE 0.9 % IV SOLN
3.0000 mg | Freq: Once | INTRAVENOUS | Status: AC
  Administered 2018-09-10: 3 mg via INTRAVENOUS
  Filled 2018-09-10: qty 2

## 2018-09-10 MED ORDER — MUPIROCIN 2 % EX OINT
TOPICAL_OINTMENT | Freq: Two times a day (BID) | CUTANEOUS | Status: DC
  Administered 2018-09-10 – 2018-09-16 (×13): via TOPICAL
  Administered 2018-09-17: 1 via TOPICAL
  Administered 2018-09-17 – 2018-10-05 (×35): via TOPICAL
  Filled 2018-09-10 (×4): qty 22

## 2018-09-10 NOTE — Progress Notes (Signed)
Rituxan increased to 100 mg/hr, VS stable. Will continue to monitor.

## 2018-09-10 NOTE — Progress Notes (Signed)
PROGRESS NOTE    Patricia Murillo  GHW:[id number] DOB: 05-19-1946 DOA: 09/07/2018 PCP: Patient, No Pcp Per  Brief Narrative:  HPI per Dr. Riccardo Dubin Arrien on 09/07/18  Patricia Murillo is a 72 y.o. 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  During her hospitalization she underwent left axillary lymph node biopsy.  She was discharged home in a stable condition, but apparently she has been developing worsening weakness, generalized, worse with exertion, no improving factors, associated with malaise, chills and poor appetite.  Over the last 24 hours her symptoms have been more severe to the point where she has difficulty standing. She has persistent left upper extremity edema which has been painful, sharp in nature up to 10 out of 10 intensity.  Her weakness has led to dyspnea which has been severe in intensity, and triggered her emergency room visit today.  ED Course: Patient was found ill looking appearing, febrile, positive leukocytosis, concern for systemic infection, referred for admission for evaluation.  **Oncology concerned about her progression of her high-grade B-cell lymphoma and is planning on starting chemotherapy today and have ordered a PICC line placement which was done by IR Dr. Annamaria Boots.  Sepsis physiology is improved.  Patient still remains hypercalcemic so oncology gave the patient Zometa and continue with IV fluids for now. She started her Rituxan yesterday but did not complete it because she had some mild infusion reactions.   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  Likely related to  progressive high-grade B-cell lymphoma with pulmonary involvement  -Continue oximetry monitoring and supplemental oxygen per nasal cannula,  -IV fluids with isotonic saline to D5 normal saline rate of 75 mL's per hour -IV antibiotic therapy with IV ceftriaxone and IV Metronidazole was started. Will continue IV Ceftriaxone for now and D/C'd IV Flagyl and D/C IV Ceftriaxone in AM -If in 24 hours no further evidence of pulmonary infection antibiotic therapy can be discontinued but will wait to De-escalate -Continue to Follow-up on cell count, cultures and temperature curve. -Wean O2 as Tolerated   High-grade B-cell lymphoma -Continue supportive medical therapy -IV analgesics for left upper extremity pain; Head CT r/o  -Oncology consulted and recommending Starting R-CHOP and patient to get Rituxan and CHOP on different days -Oncology Dr. Burr Medico does not recommend a bone marrow biopsy as this would not change restaging imaging and management -Dr. Burr Medico has ordered FISH to rule out double hit or triple hit high-grade lymphoma -Patient to have PICC line placed tomorrow by IR start initiation of chemotherapy -Oncology gave the patient Solu-Medrol 500 mg every 12 for 2 doses today given her Hemoolysis -Rituxan was initiated yesterday but unable to complete due to infusion reactions so will get repeat treatment with Benadryl, Tylenol and Steroids and remainder of Rituximab and CHOP chemotherapy to follow -Pain Control with Acetaminophen, Hydrocodone-Acetaminophen 1 tab po q6hprn, and IV Morphine 2 mg q2hprn -With her Anasarca from Cancer she is going to be given IV Lasix from Oncology   Acute encephalopathy likely in the setting of hypercalcemia, improving -Stable and improving  -Continue monitor -Head CT noncontrast showed no evidence of any acute intracranial  abnormality.  There is mild chronic small vessel ischemic changes in the cerebral white matter.  There is also mild chronic appearing paranasal  sinusitis. -If not improving and calcium level is improved we will obtain an MR  Hypercalcemia likely of Malignancy -Calcium level was 12.0 today and corrected calcium likely higher than that due to albumin level -Tinea with D5 normal saline at rate of 75 mL's per hour -Patient was given zoledronic acid IV 4 mg once yesterday per oncology -Continue to Monitor   Iron deficiency anemia/Hemolysis from [place] -Patient's hemoglobin/hematocrit went from 8.2/27.5 is now 6.9/24.2 -Continue with Ferrous Gluconate 324 mg po BID -Type and screen and transfuse 2 units of irradiated blood; Blood Count now shows Hb/Hct of 8.9/30.1 -Patient's LDH was 4.12 and T Bil was 2.0 and worsened to 3.8 -Continue to Monitor for S/Sx of Bleeding  -Patient was started on IV Solumedrol 500 mg po q12h by Oncology and will be placed on Steroids now -Repeat CBC in AM   Third-degree AV block s/p pacemaker -Patient currently has a V paced rhythm -Continue telemetry monitoring.  Obesity -Estimated body mass index is 31.24 kg/m as calculated from the following:   Height as of this encounter: _0  (1.676 m).   Weight as of this encounter: 87.8 kg. -Weight Loss Counseling given  Non-Severe (moderate) malnutrition in the context of acute illness/injury -Nutrition is consulted for further evaluation recommendations -Continue with Ensure Enlive p.o. twice daily  Left Arm Weakness and Swelling -Possibly secondary to high-grade lymphoma -Continue monitor and may need an MRI to rule out strokelike symptoms but will hold off for now -Oncology giving IV Lasix for Swelling   Pupil Discrepancy -Noted to be new -No Vision loss -Head CT Negative -Continue to Monitor   DVT prophylaxis: Enoxaparin 40 mg sq q24h Code Status: FULL CODE Family Communication: Discussed with Son at bedside Disposition Plan: Remain Inpatient for Chemotherapy  Consultants:   Medical Oncology   Procedures:  PICC Line  R-CHOP Chemo  to be initiated    Antimicrobials:  Anti-infectives (From admission, onward)   Start     Dose/Rate Route Frequency Ordered Stop   09/10/18 1000  acyclovir (ZOVIRAX) 200 MG capsule 400 mg     400 mg Oral 2 times daily 09/10/18 0754     09/07/18 2000  cefTRIAXone (ROCEPHIN) 1 g in sodium chloride 0.9 % 100 mL IVPB     1 g 200 mL/hr over 30 Minutes Intravenous Every 24 hours 09/07/18 1844     09/07/18 0830  ceFEPIme (MAXIPIME) 2 g in sodium chloride 0.9 % 100 mL IVPB     2 g 200 mL/hr over 30 Minutes Intravenous  Once 09/07/18 0820 09/07/18 0912   09/07/18 0830  metroNIDAZOLE (FLAGYL) IVPB 500 mg  Status:  Discontinued     500 mg 100 mL/hr over 60 Minutes Intravenous Every 8 hours 09/07/18 0820 09/08/18 1812   09/07/18 0830  vancomycin (VANCOCIN) IVPB 1000 mg/200 mL premix     1,000 mg 200 mL/hr over 60 Minutes Intravenous  Once 09/07/18 0820 09/07/18 1157     Subjective: Seen and examined at bedside and was a little more somnolent today.  Transfusion reactions yesterday and rituximab had to be stopped early.  No chest pain, lightheadedness or dizziness.  No nausea or vomiting.  Does complain of some constipation though.  Objective: Vitals:   09/10/18 1040 09/10/18 1120 09/10/18 1148 09/10/18 1344  BP: 140/64 128/67 135/64 131/62  Pulse: 91 89 85 84  Resp: 20 (!) _0 Temp: 98 F (36.7 C) 98 F (36.7 C) 98 F (36.7 C) 97.9 F (36.6 C)  TempSrc: Axillary Axillary Axillary Axillary  SpO2: 98% 98% 98% 98%  Weight:      Height:        Intake/Output Summary (Last 24 hours) at 09/10/2018 2011 Last data filed at 09/10/2018 1936 Gross per 24 hour  Intake 600 ml  Output 1000 ml  Net -400 ml   Filed Weights   09/08/18 0505 09/09/18 0500  Weight: 89.3 kg 87.8 kg   Examination: Physical Exam:  Constitutional: Well-nourished, well-developed obese Caucasian female currently no acute distress appears calm and she is a little bit more sleepy today complaining of some  constipation Eyes: Lids and conjunctive are normal.  Sclera anicteric ENMT: External ears and nose appear normal.  Has a small lesion on the tip of her nose.  Grossly normal hearing Neck: Appears supple and had no JVD but does have a fullness on the left side and likely tumor burden Respiratory: Diminished to auscultation bilaterally with mild coarse breath sounds.  No appreciable wheezing, rales, rhonchi.  Patient not tachypneic or using any accessory muscles to breathe but is wearing supplemental oxygen via nasal cannula Cardiovascular: Regular rate and rhythm.  No appreciable murmurs, rubs or gallops.  Has 2+ lower extremity edema bilaterally as well as 2+ upper extremity edema on the left, 1+ upper extremity edema right arm Abdomen: Soft, nontender, distended secondary body habitus.  Bowel sounds present GU: Deferred Musculoskeletal: No appreciable contractures cyanosis. Skin: Warm and dry.  No appreciable rashes or lesions limited skin evaluation Neurologic: No nerves II through XII grossly intact no appreciable focal deficits.  Left pupil is still smaller than her right pupil.  Romberg sign and cerebellar reflexes not assessed.  Left arm weakness but is extremely swollen likely limited mobility due to her swelling Psychiatric: Impaired judgment insight.  Patient is a little more somnolent today than she was yesterday  Data Reviewed: I have personally reviewed following labs and imaging studies  CBC: Recent Labs  Lab 09/07/18 0807 09/08/18 0337 09/09/18 0428 09/10/18 0500  WBC 14.7* 9.8 9.6 12.0*  NEUTROABS 11.8*  --  8.3* 11.0*  HGB 8.2* 6.9* 8.5* 8.9*  HCT 27.5* 24.0*  20.9* 28.8* 30.1*  MCV 92.6 93.8 94.1 93.2  PLT 260 195 149* 622*   Basic Metabolic Panel: Recent Labs  Lab 09/07/18 0807 09/08/18 0337 09/09/18 0428 09/10/18 0500  NA 133* 135 135 137  K 4.4 4.2 4.3 4.5  CL 96* 101 103 103  CO2 25 24 21* 23  GLUCOSE 114* 104* 155* 101*  BUN 30* 29* 32* 47*  CREATININE  0.82 0.80 0.74 0.94  CALCIUM 12.5* 12.2* 11.9* 12.0*  MG  --   --  1.9 2.0  PHOS  --   --  3.2 3.3   GFR: Estimated Creatinine Clearance: 61.3 mL/min (by C-G formula based on SCr of 0.94 mg/dL). Liver Function Tests: Recent Labs  Lab 09/07/18 0807 09/08/18 0337 09/09/18 0428 09/10/18 0500  AST 24  --  28 24  ALT 13  --  13 15  ALKPHOS 76  --  66 73  BILITOT 3.4* 2.0* 3.8* 2.4*  PROT 4.9*  --  4.3* 4.3*  ALBUMIN 2.5*  --  2.3* 2.4*   No results for input(s): LIPASE, AMYLASE in the last 168 hours. No results for input(s): AMMONIA in the last 168 hours. Coagulation Profile: Recent Labs  Lab 09/07/18 0807  INR 1.23   Cardiac Enzymes: No results for input(s): CKTOTAL, CKMB, CKMBINDEX, TROPONINI in the last 168 hours. BNP (last 3 results) No results for input(s): PROBNP in the last 8760 hours. HbA1C: No results for input(s): HGBA1C in the last 72 hours. CBG: No results for input(s): GLUCAP in the last 168 hours. Lipid Profile: No results for input(s): CHOL, HDL, LDLCALC, TRIG, CHOLHDL, LDLDIRECT in the last 72 hours. Thyroid Function Tests: No results for input(s): TSH, T4TOTAL, FREET4, T3FREE, THYROIDAB in the last 72 hours. Anemia Panel: Recent Labs    09/08/18 0337  RETICCTPCT 7.9*   Sepsis Labs: Recent Labs  Lab 09/07/18 0817 09/07/18 1056  LATICACIDVEN 2.84* 2.32*    Recent Results (from the past 240 hour(s))  Culture, blood (Routine x 2)     Status: None (Preliminary result)   Collection Time: 09/07/18  8:07 AM  Result Value Ref Range Status   Specimen Description   Final    BLOOD LEFT HAND Performed at [facility] [address], [place], [place] [address]    Special Requests   Final    Blood Culture adequate volume BOTTLES DRAWN AEROBIC AND ANAEROBIC   Culture   Final    NO GROWTH 3 DAYS Performed at [facility], [place] [address] [place], [place] [address]    Report Status PENDING  Incomplete  Culture, blood (Routine x  2)     Status: None (Preliminary result)   Collection Time: 09/07/18  8:18 AM  Result Value Ref Range Status   Specimen Description   Final    BLOOD RIGHT ANTECUBITAL Performed at [facility] [address] [place], [place] [address]    Special Requests   Final    BOTTLES DRAWN AEROBIC AND ANAEROBIC Blood Culture adequate volume Performed at [facility] [address], [place], [place] [address]    Culture   Final    NO GROWTH 3 DAYS Performed at [facility], [place] [address] [place], [place] [address]    Report Status PENDING  Incomplete  Urine culture     Status: None   Collection Time: 09/07/18  8:22 AM  Result Value Ref Range Status   Specimen Description   Final    URINE, CLEAN CATCH Performed at [facility], [place] [address] [place], [place] [address]    Special Requests   Final    NONE Performed at [facility], [place] [address], [place], [place] [address]    Culture   Final    NO GROWTH Performed at [facility], [place] [address] [place], [place] [address]    Report Status 09/08/2018 FINAL  Final    Radiology Studies: Dg Chest Port 1 View  Result Date: 09/09/2018 CLINICAL DATA:  Hypoxia, history of progressive lymphoma EXAM: PORTABLE CHEST 1 VIEW COMPARISON:  110/08/2018 FINDINGS: Cardiac shadow is enlarged. Pacing device is again seen. Bilateral pleural effusions are again noted. The nodular changes seen on recent chest CT are not as well appreciated on today's exam. No pneumothorax is noted. IMPRESSION: Bilateral effusions. Patchy densities are noted consistent findings of recent CT although less well appreciated on today's exam. Electronically Signed   By: Inez Catalina M.D.   On: 09/09/2018 06:35   Ir Picc Placement Right >5 Yrs Inc Img Guide  Result Date: 09/09/2018 INDICATION: Acute lymphoma, access for chemotherapy as an inpatient EXAM: [place] PICC  LINE INSERTION MEDICATIONS: 1% lidocaine local CONTRAST:  None FLUOROSCOPY TIME:  Twenty-four seconds (8 mGy) COMPLICATIONS: None immediate. TECHNIQUE: The procedure, risks, benefits, and alternatives were explained to the patient and informed written consent was obtained. A timeout was performed prior to the initiation of the procedure. The right upper extremity was prepped with chlorhexidine in a sterile fashion, and a sterile drape was applied covering the operative field. Maximum barrier sterile technique with sterile gowns and gloves were used for the procedure. A timeout was performed prior to the initiation of the procedure. Local anesthesia was provided with 1% lidocaine. Under direct ultrasound guidance, the right brachial vein was accessed with a micropuncture kit after the overlying soft tissues were anesthetized with 1% lidocaine. An ultrasound image was saved for documentation purposes. A guidewire was advanced to the level of the superior caval-atrial junction for measurement purposes and the PICC line was cut to length. A peel-away sheath was placed and a 36 cm, 5 [place], dual lumen was inserted to level of the superior caval-atrial junction. A post procedure spot fluoroscopic was obtained. The catheter easily aspirated and flushed and was sutured in place. A dressing was placed. The patient tolerated the procedure well without immediate post procedural complication. FINDINGS: After catheter placement, the tip lies within the superior cavoatrial junction. The catheter aspirates and flushes normally and is ready for immediate use. IMPRESSION: Successful ultrasound and fluoroscopic guided placement of a right brachial vein approach, 36 cm, 5 French, dual lumen PICC with tip at the superior caval-atrial junction. The PICC line is ready for immediate use. Electronically Signed   By: Jerilynn Mages.  Shick M.D.   On: 09/09/2018 09:23   Scheduled Meds: . acyclovir  400 mg Oral BID  . allopurinol  300 mg Oral Daily    . cholecalciferol  2,000 Units Oral Daily  . enoxaparin (LOVENOX) injection  40 mg Subcutaneous Q24H  . feeding supplement (ENSURE ENLIVE)  237 mL Oral BID BM  . folic acid  1 mg Oral Daily  . mouth rinse  15 mL Mouth Rinse BID  . mupirocin cream   Topical BID  . polyethylene glycol  17 g Oral Daily  . predniSONE  60 mg Oral Q breakfast   Continuous Infusions: . sodium chloride 10 mL/hr at 09/09/18 2122  . cefTRIAXone (ROCEPHIN)  IV 1 g (09/09/18 2120)    LOS: 3 days   Kerney Elbe, DO [facility] PAGER is on [place]  If 7PM-7AM, please contact night-coverage [email or web address] Password [email or web address] 09/10/2018, 8:11 PM

## 2018-09-10 NOTE — Progress Notes (Signed)
Rituxan restarted from yesterdays dose where patient developed tachypnea. Vital signs stable, started at 50 mg/hr will increase slowly.

## 2018-09-10 NOTE — Progress Notes (Signed)
Rituxan increased to 150 mg/hr, VS stable. Will continue to monitor.

## 2018-09-10 NOTE — Progress Notes (Signed)
Rituxan continued at 150 mg/hr, VS stable. Will continue to monitor.

## 2018-09-10 NOTE — Progress Notes (Signed)
Patricia Murillo   DOB:1945-12-21   OH#:[id number]    Assessment & Plan:  High-grade diffuse large B-cell lymphoma The patient had first dose of rituximab yesterday, complicated by mild infusion reactions I recommend repeat treatment with Benadryl, Tylenol and steroids this morning followed by the rest of the rituximab infusion before we complete the rest of her chemotherapy today. In terms of her labs, her uric acid is trending up.  I will give her a dose of rasburicase later today after completion of chemotherapy.  She will continue allopurinol.  We will continue to monitor her labs closely  Mild fluid overload The cause of her swelling is related to lymph node obstruction, third spacing from low albumin and likely mild fluid overload given recent blood transfusion and IV fluids. IV fluids were discontinued. I recommend gentle diuresis with 1 dose of furosemide 20 mg IV  Hypercalcemia Related to malignancy.  Monitor closely.  Hemolytic anemia secondary to lymphoma She has received blood transfusions recently.  Her hemoglobin is stable.  I plan to discontinue iron supplement as it will cause constipation and will unlikely help with her anemia  Severe constipation I have discontinue oral iron supplement and we will start her on MiraLAX daily  Possible recent herpes simplex I will start her on low-dose acyclovir  Mild thrombocytopenia Could be related to her disease.  Monitor closely.  No contraindication to proceed with chemotherapy or Lovenox  Discharge planning She looks ill.  With persistent hypercalcemia, she is not a candidate to be discharged at this point.  She will likely remain here throughout the weekend.  I will see her tomorrow  [name], MD 09/10/2018  7:51 AM   Subjective:  The patient is somewhat sleepy.  She is mumbling that she has some pain on the left upper arm.  She has diffuse swelling of all extremities, especially of the left upper arm.  The nursing staff  noted the right arm is also quite swollen compared to previous. She has been having some intermittent cough.  Her oxygen saturation has been adequate.  IV fluids were discontinued yesterday.  She is still making urine.  Her urine looks dark/tea color Daughter is by the bedside.  Rituximab was discontinued prematurely yesterday due to possible slight infusion reaction.  There were no reported recent nausea or vomiting but the patient is quite constipated.  Objective:  Vitals:   09/10/18 0129 09/10/18 0742  BP:  128/66  Pulse: 96 87  Resp:  20  Temp:  (!) 97.5 F (36.4 C)  SpO2: 92% 98%     Intake/Output Summary (Last 24 hours) at 09/10/2018 0751 Last data filed at 09/09/2018 1837 Gross per 24 hour  Intake 220 ml  Output 300 ml  Net -80 ml    GENERAL: She is sleepy but intermittently alert and responds to questions. SKIN: She looks pale.  Noted skin rash on her tip of the nose/lip, consistent with possible herpes simplex EYES: normal, Conjunctiva are pink and non-injected, sclera clear OROPHARYNX:no exudate, no erythema and lips, buccal mucosa, and tongue normal  NECK: supple, thyroid normal size, non-tender, without nodularity LYMPH:  no palpable lymphadenopathy in the cervical, axillary or inguinal LUNGS: Mild increased respiratory effort with poor air entry, mild crackles at the lung bases HEART: regular rate & rhythm and no murmurs with diffuse swelling throughout ABDOMEN:abdomen soft, non-tender and normal bowel sounds Musculoskeletal:no cyanosis of digits and no clubbing  NEURO: no focal motor/sensory deficits   Labs:  Lab Results  Component Value Date   WBC 12.0 (H) 09/10/2018   HGB 8.9 (L) 09/10/2018   HCT 30.1 (L) 09/10/2018   MCV 93.2 09/10/2018   PLT 120 (L) 09/10/2018   NEUTROABS 11.0 (H) 09/10/2018    Lab Results  Component Value Date   NA 137 09/10/2018   K 4.5 09/10/2018   CL 103 09/10/2018   CO2 23 09/10/2018    Studies:  Dg Chest Port 1  View  Result Date: 09/09/2018 CLINICAL DATA:  Hypoxia, history of progressive lymphoma EXAM: PORTABLE CHEST 1 VIEW COMPARISON:  [id number] FINDINGS: Cardiac shadow is enlarged. Pacing device is again seen. Bilateral pleural effusions are again noted. The nodular changes seen on recent chest CT are not as well appreciated on today's exam. No pneumothorax is noted. IMPRESSION: Bilateral effusions. Patchy densities are noted consistent findings of recent CT although less well appreciated on today's exam. Electronically Signed   By: Inez Catalina M.D.   On: 09/09/2018 06:35   Ir Picc Placement Right >5 Yrs Inc Img Guide  Result Date: 09/09/2018 INDICATION: Acute lymphoma, access for chemotherapy as an inpatient EXAM: ULTRASOUND AND FLUOROSCOPIC GUIDED PICC LINE INSERTION MEDICATIONS: 1% lidocaine local CONTRAST:  None FLUOROSCOPY TIME:  Twenty-four seconds (8 mGy) COMPLICATIONS: None immediate. TECHNIQUE: The procedure, risks, benefits, and alternatives were explained to the patient and informed written consent was obtained. A timeout was performed prior to the initiation of the procedure. The right upper extremity was prepped with chlorhexidine in a sterile fashion, and a sterile drape was applied covering the operative field. Maximum barrier sterile technique with sterile gowns and gloves were used for the procedure. A timeout was performed prior to the initiation of the procedure. Local anesthesia was provided with 1% lidocaine. Under direct ultrasound guidance, the right brachial vein was accessed with a micropuncture kit after the overlying soft tissues were anesthetized with 1% lidocaine. An ultrasound image was saved for documentation purposes. A guidewire was advanced to the level of the superior caval-atrial junction for measurement purposes and the PICC line was cut to length. A peel-away sheath was placed and a 36 cm, 5 [place], dual lumen was inserted to level of the superior caval-atrial junction. A  post procedure spot fluoroscopic was obtained. The catheter easily aspirated and flushed and was sutured in place. A dressing was placed. The patient tolerated the procedure well without immediate post procedural complication. FINDINGS: After catheter placement, the tip lies within the superior cavoatrial junction. The catheter aspirates and flushes normally and is ready for immediate use. IMPRESSION: Successful ultrasound and fluoroscopic guided placement of a right brachial vein approach, 36 cm, 5 French, dual lumen PICC with tip at the superior caval-atrial junction. The PICC line is ready for immediate use. Electronically Signed   By: Jerilynn Mages.  Shick M.D.   On: 09/09/2018 09:23   [place] Ekg Site Rite  Result Date: 09/08/2018 If Site Rite image not attached, placement could not be confirmed due to current cardiac rhythm.

## 2018-09-10 NOTE — Progress Notes (Signed)
PT Cancellation Note  Patient Details Name: NEFERTARI REBMAN MRN: [id number] DOB: 06-27-1946   Cancelled Treatment:    Reason Eval/Treat Not Completed: Patient's level of consciousness(pt lethargic, unable to arouse. Will follow. )   Philomena Doheny PT 09/10/2018  Acute Rehabilitation Services Pager [phone] Office [phone]

## 2018-09-11 ENCOUNTER — Inpatient Hospital Stay (HOSPITAL_COMMUNITY): Payer: Medicare HMO

## 2018-09-11 LAB — COMPREHENSIVE METABOLIC PANEL
ALT: 16 U/L (ref 0–44)
AST: 25 U/L (ref 15–41)
Albumin: 2.3 g/dL — ABNORMAL LOW (ref 3.5–5.0)
Alkaline Phosphatase: 68 U/L (ref 38–126)
Anion gap: 10 (ref 5–15)
BUN: 66 mg/dL — AB (ref 8–23)
CO2: 22 mmol/L (ref 22–32)
Calcium: 11.1 mg/dL — ABNORMAL HIGH (ref 8.9–10.3)
Chloride: 105 mmol/L (ref 98–111)
Creatinine, Ser: 1.02 mg/dL — ABNORMAL HIGH (ref 0.44–1.00)
GFR calc non Af Amer: 55 mL/min — ABNORMAL LOW (ref >60)
Glucose, Bld: 102 mg/dL — ABNORMAL HIGH (ref 70–99)
Potassium: 4.9 mmol/L (ref 3.5–5.1)
Sodium: 137 mmol/L (ref 135–145)
Total Bilirubin: 2.1 mg/dL — ABNORMAL HIGH (ref 0.3–1.2)
Total Protein: 4.3 g/dL — ABNORMAL LOW (ref 6.5–8.1)

## 2018-09-11 LAB — CBC WITH DIFFERENTIAL/PLATELET
Abs Immature Granulocytes: 0.17 10*3/uL — ABNORMAL HIGH (ref 0.00–0.07)
BASOS ABS: 0 10*3/uL (ref 0.0–0.1)
Basophils Relative: 0 %
Eosinophils Absolute: 0 10*3/uL (ref 0.0–0.5)
Eosinophils Relative: 0 %
HCT: 29 % — ABNORMAL LOW (ref 36.0–46.0)
HEMOGLOBIN: 8.5 g/dL — AB (ref 12.0–15.0)
Immature Granulocytes: 1 %
LYMPHS PCT: 1 %
Lymphs Abs: 0.1 10*3/uL — ABNORMAL LOW (ref 0.7–4.0)
MCH: 27.7 pg (ref 26.0–34.0)
MCHC: 29.3 g/dL — ABNORMAL LOW (ref 30.0–36.0)
MCV: 94.5 fL (ref 80.0–100.0)
Monocytes Absolute: 0.7 10*3/uL (ref 0.1–1.0)
Monocytes Relative: 5 %
NEUTROS ABS: 11.8 10*3/uL — AB (ref 1.7–7.7)
NEUTROS PCT: 93 %
Platelets: 91 10*3/uL — ABNORMAL LOW (ref 150–400)
RBC: 3.07 MIL/uL — ABNORMAL LOW (ref 3.87–5.11)
RDW: 20.7 % — ABNORMAL HIGH (ref 11.5–15.5)
WBC: 12.8 10*3/uL — ABNORMAL HIGH (ref 4.0–10.5)
nRBC: 0 % (ref 0.0–0.2)

## 2018-09-11 LAB — URIC ACID
Uric Acid, Serum: 6.1 mg/dL (ref 2.5–7.1)
Uric Acid, Serum: 6.5 mg/dL (ref 2.5–7.1)

## 2018-09-11 LAB — LACTATE DEHYDROGENASE: LDH: 430 U/L — ABNORMAL HIGH (ref 98–192)

## 2018-09-11 LAB — MAGNESIUM: Magnesium: 2.1 mg/dL (ref 1.7–2.4)

## 2018-09-11 LAB — PHOSPHORUS: Phosphorus: 4.5 mg/dL (ref 2.5–4.6)

## 2018-09-11 MED ORDER — SENNOSIDES-DOCUSATE SODIUM 8.6-50 MG PO TABS
1.0000 | ORAL_TABLET | Freq: Two times a day (BID) | ORAL | Status: DC
  Administered 2018-09-11 – 2018-09-29 (×26): 1 via ORAL
  Filled 2018-09-11 (×31): qty 1

## 2018-09-11 NOTE — Progress Notes (Signed)
Patricia Murillo   DOB:[birth year]   HG#:[id number]    Assessment & Plan:  High-grade diffuse large B-cell lymphoma The patient had first dose of rituximab on Friday, complicated by mild infusion reactions She was able to receive the rest of the rituximab infusion along with chemotherapy yesterday She received 1 dose of rasburicase yesterday after completion of chemotherapy.  She will continue allopurinol.  We will continue to monitor her labs closely.  So far, she has no signs of tumor lysis syndrome.  Total bilirubin is trending down.  Mild fluid overload, improved with 1 dose of furosemide on September 10, 2018 The cause of her swelling is related to lymph node obstruction, third spacing from low albumin and likely mild fluid overload given recent blood transfusion and IV fluids. IV fluids were discontinued. She has responded well to 1 dose of IV furosemide yesterday.  We will monitor closely.  Hypercalcemia, improving Related to malignancy.  Monitor closely.  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  Her hemoglobin is stable.  I plan to discontinue iron supplement as it will cause constipation and will unlikely help with her anemia  Severe constipation I have discontinue oral iron supplement and we will start her on MiraLAX daily We will add Senokot  Possible recent herpes simplex I will start her on low-dose acyclovir  Mild thrombocytopenia Could be related to her disease.  Monitor closely.  No contraindication to proceed with chemotherapy or Lovenox as long as her platelet count is above 50  Discharge planning She looks ill.  With persistent hypercalcemia, she is not a candidate to be discharged at this point.  She will likely remain here throughout the weekend.  I will see her tomorrow I encouraged her to participate with physical therapy while hospitalized Dr. Burr Medico will resume care tomorrow  [name], MD 09/11/2018   9:10 AM   Subjective:  She appears intermittently alert.  Her pain is well controlled.  She had good diuresis after IV Lasix yesterday.  IV fluid has been discontinued.  She remain constipated.  No nausea.  She was unable to participate with physical therapy yesterday due to sedation and chemotherapy.  She was able to complete chemotherapy without complications.  Objective:  Vitals:   09/10/18 2112 09/11/18 0456  BP: (!) 130/59 127/62  Pulse: 81 85  Resp: 18 17  Temp: (!) 97.5 F (36.4 C) 97.8 F (36.6 C)  SpO2: 98% 98%     Intake/Output Summary (Last 24 hours) at 09/11/2018 0910 Last data filed at 09/11/2018 0715 Gross per 24 hour  Intake 600 ml  Output 1500 ml  Net -900 ml    GENERAL: intermittently alert, no distress and comfortable SKIN: skin color, texture, turgor are normal, no rashes or significant lesions.  Noted crusted lesions on her face EYES: normal, Conjunctiva are pale and non-injected, sclera clear OROPHARYNX:no exudate, no erythema and lips, buccal mucosa, and tongue normal  NECK: supple, thyroid normal size, non-tender, without nodularity LYMPH: Persistent palpable lymphadenopathy over the left axilla LUNGS: Reduced breath sounds on lung bases with normal breathing effort.  This is much improved compared to yesterday's exam HEART: regular rate & rhythm and no murmurs diffuse upper and lower extremity edema ABDOMEN:abdomen soft, non-tender and normal bowel sounds Musculoskeletal:no cyanosis of digits and no clubbing  NEURO: alert & oriented x 3 with fluent speech, no focal motor/sensory deficits   Labs:  Lab Results  Component Value Date   WBC 12.8 (H) 09/11/2018  HGB 8.5 (L) 09/11/2018   HCT 29.0 (L) 09/11/2018   MCV 94.5 09/11/2018   PLT 91 (L) 09/11/2018   NEUTROABS 11.8 (H) 09/11/2018    Lab Results  Component Value Date   NA 137 09/11/2018   K 4.9 09/11/2018   CL 105 09/11/2018   CO2 22 09/11/2018    Studies:  No results found.

## 2018-09-11 NOTE — Progress Notes (Signed)
PROGRESS NOTE    ELLIZABETH DACRUZ  NIO:[id number] DOB: 23-Jul-1946 DOA: 09/07/2018 PCP: Patient, No Pcp Per  Brief Narrative:  HPI per Dr. Riccardo Dubin Arrien on 09/07/18  MACKINZIE VUNCANNON is a 72 y.o. 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  During her hospitalization she underwent left axillary lymph node biopsy.  She was discharged home in a stable condition, but apparently she has been developing worsening weakness, generalized, worse with exertion, no improving factors, associated with malaise, chills and poor appetite.  Over the last 24 hours her symptoms have been more severe to the point where she has difficulty standing. She has persistent left upper extremity edema which has been painful, sharp in nature up to 10 out of 10 intensity.  Her weakness has led to dyspnea which has been severe in intensity, and triggered her emergency room visit today.  ED Course: Patient was found ill looking appearing, febrile, positive leukocytosis, concern for systemic infection, referred for admission for evaluation.  **Oncology concerned about her progression of her high-grade B-cell lymphoma and is planning on starting chemotherapy today and have ordered a PICC line placement which was done by IR Dr. Annamaria Boots.  Sepsis physiology is improved.  Patient still remains hypercalcemic so oncology gave the patient Zometa and continued with IV fluidsbut have now discontinued. She started her Rituxan the day before yesterday but did not complete it because she had some mild infusion reactions.  The completion of her chemotherapy yesterday and this morning she is doing well but then she started coughing with her medication became slightly altered and confused after pain medication.  She is made n.p.o. and SLP evaluated and recommended continuing n.p.o. at this time  Assessment &  Plan:   Active Problems:   Respiratory failure (HCC)   Lymphoma (HCC)   Sepsis (HCC)   High grade B-cell lymphoma (HCC)   Malnutrition of moderate degree   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 (HCC)   Hypercalcemia  Acute hypoxic respiratory failure.  Likely related to progressive high-grade B-cell lymphoma with pulmonary involvement comp located by suspected aspiration -Continue oximetry monitoring and supplemental oxygen per nasal cannula,  -IV fluids with isotonic saline to D5 normal saline rate of 75 mL's per hour but now discontinued and college he gave IV Lasix yesterday -IV antibiotic therapy with IV ceftriaxone and IV Metronidazole was started. Will continue IV Ceftriaxone for now and D/C'd IV Flagyl and  -If in 24 hours no further evidence of pulmonary infection antibiotic therapy can be discontinued but will wait to De-escalate as patient now aspirated and may need to change to IV Unasyn -Made n.p.o. on data SLP -Chest x-ray done and showed stable chest x-ray demonstrating pulmonary lymphoma and bilateral pleural effusions -Continue to Follow-up on cell count, cultures and temperature curve. -Wean O2 as Tolerated  -We will continue to follow SLP evaluations and make n.p.o. now  High-grade B-cell lymphoma -Continue supportive medical therapy -IV analgesics for left upper extremity pain; Head CT r/o  -Oncology consulted and recommending Starting R-CHOP and patient to get Rituxan and CHOP on different days -Oncology Dr. Burr Medico does not recommend a bone marrow biopsy as this would not change restaging imaging and management -Dr. Burr Medico has ordered [facility] to rule out double hit or triple hit high-grade lymphoma -Patient to have PICC line placed tomorrow by IR start initiation of chemotherapy -Oncology gave the  patient Solu-Medrol 500 mg every 12 for 2 doses today given her Hemoolysis -Rituxan was initiated a day before yesterday but unable to complete due to  infusion reactions so will get repeat treatment with Benadryl, Tylenol and Steroids and remainder of Rituximab and CHOP received chemotherapy yesterday. -Pain Control with Acetaminophen, Hydrocodone-Acetaminophen 1 tab po q6hprn, and IV Morphine 2 mg q2hprn -With her Anasarca from Cancer she is going to be given IV Lasix from Oncology and IV fluids have now been stopped  Acute encephalopathy likely in the setting of hypercalcemia -Became a little bit more altered today and confused -Continue monitor -Head CT noncontrast showed no evidence of any acute intracranial abnormality.  There is mild chronic small vessel ischemic changes in the cerebral white matter.  There is also mild chronic appearing paranasal sinusitis. -If not improving and calcium level is improved we will obtain an MRI -Patient likely also aspirated -We will make n.p.o. -Continue monitor her mental status  Hypercalcemia likely of Malignancy -Calcium level was 11.1 today and corrected calcium likely higher than that due to albumin level -IV fluid is now been discontinued -Patient was given zoledronic acid IV 4 mg once yesterday per oncology -Continue to Monitor   Iron deficiency anemia/Hemolysis from [place] -Patient's hemoglobin/hematocrit went from 8.2/27.5 is now 6.9/24.2 -Continue with Ferrous Gluconate 324 mg po BID -Type and screen and transfuse 2 units of irradiated blood; Blood Count now shows Hb/Hct of 8.5/29.0 -Patient's LDH was 4.12 and T Bil was 2.0 and worsened to 3.8 -Continue to Monitor for S/Sx of Bleeding  -Patient was started on IV Solumedrol 500 mg IV q12h by Oncology and will be placed on Steroids now p.o. 60 mg daily -Repeat CBC in AM   Third-degree AV block s/p pacemaker -Patient currently has a V paced rhythm -Continue telemetry monitoring.  Obesity -Estimated body mass index is 31.24 kg/m as calculated from the following:   Height as of this encounter: '5\' 6"'$  (1.676 m).   Weight as of this  encounter: 87.8 kg. -Weight Loss Counseling given  Non-Severe (moderate) malnutrition in the context of acute illness/injury -Nutrition is consulted for further evaluation recommendations -Continue with Ensure Enlive p.o. twice daily  Left Arm Weakness and Swelling -Possibly secondary to high-grade lymphoma -Continue monitor and may need an MRI to rule out strokelike symptoms but will hold off for now -Oncology giving IV Lasix for Swelling and improved some  Pupil Discrepancy -Noted to be new -No Vision loss -Head CT Negative -Continue to Monitor   DVT prophylaxis: Enoxaparin 40 mg sq q24h Code Status: FULL CODE Family Communication: Discussed with Son at bedside Disposition Plan: Remain Inpatient for Chemotherapy  Consultants:   Medical Oncology   Procedures:  PICC Line  R-CHOP Chemo to be initiated    Antimicrobials:  Anti-infectives (From admission, onward)   Start     Dose/Rate Route Frequency Ordered Stop   09/10/18 1000  acyclovir (ZOVIRAX) 200 MG capsule 400 mg     400 mg Oral 2 times daily 09/10/18 0754     09/07/18 2000  cefTRIAXone (ROCEPHIN) 1 g in sodium chloride 0.9 % 100 mL IVPB     1 g 200 mL/hr over 30 Minutes Intravenous Every 24 hours 09/07/18 1844     09/07/18 0830  ceFEPIme (MAXIPIME) 2 g in sodium chloride 0.9 % 100 mL IVPB     2 g 200 mL/hr over 30 Minutes Intravenous  Once 09/07/18 0820 09/07/18 0912   09/07/18 0830  metroNIDAZOLE (FLAGYL) IVPB 500  mg  Status:  Discontinued     500 mg 100 mL/hr over 60 Minutes Intravenous Every 8 hours 09/07/18 0820 09/08/18 1812   09/07/18 0830  vancomycin (VANCOCIN) IVPB 1000 mg/200 mL premix     1,000 mg 200 mL/hr over 60 Minutes Intravenous  Once 09/07/18 0820 09/07/18 1157     Subjective: Seen and examined at bedside and was a little more somnolent today but then started coughing and likely aspirated.  She became a little bit more confused and had just been administered pain medication.  SLP evaluated  and recommending strict n.p.o. at this time.  I think that her leg swelling is improved somewhat.  No other concerns or complaints at this time  Objective: Vitals:   09/10/18 2112 09/11/18 0456 09/11/18 1147 09/11/18 1418  BP: (!) 130/59 127/62 138/71 125/64  Pulse: 81 85 97 89  Resp: 18 17 (!) 26 (!) 22  Temp: (!) 97.5 F (36.4 C) 97.8 F (36.6 C) 99 F (37.2 C) (!) 97.4 F (36.3 C)  TempSrc: Oral Oral Axillary Axillary  SpO2: 98% 98% 97% 98%  Weight:      Height:        Intake/Output Summary (Last 24 hours) at 09/11/2018 2042 Last data filed at 09/11/2018 1843 Gross per 24 hour  Intake 240 ml  Output 1100 ml  Net -860 ml   Filed Weights   09/08/18 0505 09/09/18 0500  Weight: 89.3 kg 87.8 kg   Examination: Physical Exam:  Constitutional: Alert, well-developed obese Caucasian female currently no acute distress and she is calm little bit more sleepy again Eyes: Lids and conjunctive are normal.  Sclera anicteric ENMT: External ears and nose appear normal.  Has a small lesion on the tip of nose.  Grossly normal hearing Neck: Appears supple with no JVD but does have fullness on the left side of her neck and likely his tumor burden Respiratory: Appears diminished with mild coarse breath sounds.  No appreciable wheezing, rales, rhonchi.  Patient not tachypneic wheezing excess muscle breathe but was wearing supplemental oxygen via nasal cannula Cardiovascular: Regular rhythm.  No appreciable murmurs, rubs or gallops.  Has 2+ lower extremity edema which is slightly improved from yesterday and 2+ upper extremity edema in the left arm.  Has 1+ upper extremity in the right arm Abdomen: Soft, nontender, distended secondary body habitus. GU: Deferred Musculoskeletal: No appreciable contractures or cyanosis. Skin: Skin is warm.  No appreciable rashes or lesions on limited skin evaluation Neurologic: Cranial nerves II through XII grossly intact.  Left pupil still small in the right pupil.   Romberg sign and cerebellar reflexes were not assessed.  Left arm is extremely weak but still swollen due to mobility Psychiatric: Poor judgment insight.  Patient i was a little more somnolent and more confused  Data Reviewed: I have personally reviewed following labs and imaging studies  CBC: Recent Labs  Lab 09/07/18 0807 09/08/18 0337 09/09/18 0428 09/10/18 0500 09/11/18 0430  WBC 14.7* 9.8 9.6 12.0* 12.8*  NEUTROABS 11.8*  --  8.3* 11.0* 11.8*  HGB 8.2* 6.9* 8.5* 8.9* 8.5*  HCT 27.5* 24.0*  20.9* 28.8* 30.1* 29.0*  MCV 92.6 93.8 94.1 93.2 94.5  PLT 260 195 149* 120* 91*   Basic Metabolic Panel: Recent Labs  Lab 09/07/18 0807 09/08/18 0337 09/09/18 0428 09/10/18 0500 09/11/18 0430  NA 133* 135 135 137 137  K 4.4 4.2 4.3 4.5 4.9  CL 96* 101 103 103 105  CO2 25 24 21* 23  22  GLUCOSE 114* 104* 155* 101* 102*  BUN 30* 29* 32* 47* 66*  CREATININE 0.82 0.80 0.74 0.94 1.02*  CALCIUM 12.5* 12.2* 11.9* 12.0* 11.1*  MG  --   --  1.9 2.0 2.1  PHOS  --   --  3.2 3.3 4.5   GFR: Estimated Creatinine Clearance: 56.5 mL/min (A) (by C-G formula based on SCr of 1.02 mg/dL (H)). Liver Function Tests: Recent Labs  Lab 09/07/18 0807 09/08/18 0337 09/09/18 0428 09/10/18 0500 09/11/18 0430  AST 24  --  _0 ALT 13  --  _1 ALKPHOS 76  --  66 73 68  BILITOT 3.4* 2.0* 3.8* 2.4* 2.1*  PROT 4.9*  --  4.3* 4.3* 4.3*  ALBUMIN 2.5*  --  2.3* 2.4* 2.3*   No results for input(s): LIPASE, AMYLASE in the last 168 hours. No results for input(s): AMMONIA in the last 168 hours. Coagulation Profile: Recent Labs  Lab 09/07/18 0807  INR 1.23   Cardiac Enzymes: No results for input(s): CKTOTAL, CKMB, CKMBINDEX, TROPONINI in the last 168 hours. BNP (last 3 results) No results for input(s): PROBNP in the last 8760 hours. HbA1C: No results for input(s): HGBA1C in the last 72 hours. CBG: No results for input(s): GLUCAP in the last 168 hours. Lipid Profile: No results for  input(s): CHOL, HDL, LDLCALC, TRIG, CHOLHDL, LDLDIRECT in the last 72 hours. Thyroid Function Tests: No results for input(s): TSH, T4TOTAL, FREET4, T3FREE, THYROIDAB in the last 72 hours. Anemia Panel: No results for input(s): VITAMINB12, FOLATE, FERRITIN, TIBC, IRON, RETICCTPCT in the last 72 hours. Sepsis Labs: Recent Labs  Lab 09/07/18 0817 09/07/18 1056  LATICACIDVEN 2.84* 2.32*    Recent Results (from the past 240 hour(s))  Culture, blood (Routine x 2)     Status: None (Preliminary result)   Collection Time: 09/07/18  8:07 AM  Result Value Ref Range Status   Specimen Description   Final    BLOOD LEFT HAND Performed at [facility] [address] [place], [place] [address]    Special Requests   Final    Blood Culture adequate volume BOTTLES DRAWN AEROBIC AND ANAEROBIC   Culture   Final    NO GROWTH 4 DAYS Performed at [facility], [place] [address] [place], [place] [address]    Report Status PENDING  Incomplete  Culture, blood (Routine x 2)     Status: None (Preliminary result)   Collection Time: 09/07/18  8:18 AM  Result Value Ref Range Status   Specimen Description   Final    BLOOD RIGHT ANTECUBITAL Performed at [facility] [address], [place], [place] [address]    Special Requests   Final    BOTTLES DRAWN AEROBIC AND ANAEROBIC Blood Culture adequate volume Performed at [facility] [address] [place], [place] [address]    Culture   Final    NO GROWTH 4 DAYS Performed at [facility], [place] [address], [place], [place] [address]    Report Status PENDING  Incomplete  Urine culture     Status: None   Collection Time: 09/07/18  8:22 AM  Result Value Ref Range Status   Specimen Description   Final    URINE, CLEAN CATCH Performed at [facility], [place] [address], [place],  [address]    Special Requests   Final    NONE Performed at [facility],  [place] [address], [place], [place] [address]    Culture   Final    NO GROWTH Performed at [facility], [place] [address], [place], [place] [address]    Report Status 09/08/2018 FINAL  Final    Radiology Studies: Dg Chest Port 1 View  Result Date: 09/11/2018 CLINICAL DATA:  Shortness of breath. History of progressive high-grade B-cell lymphoma. EXAM: PORTABLE CHEST 1 VIEW COMPARISON:  Chest x-ray on 09/09/2018 and CT of the chest on 12020/01/218. FINDINGS: Stable heart size, appearance of pacemaker and large hiatal hernia. Stable probable bilateral pleural effusions and bilateral pulmonary nodules. No overt pulmonary edema. No pneumothorax. IMPRESSION: Stable chest x-ray demonstrating pulmonary lymphoma and bilateral pleural effusions. Electronically Signed   By: Aletta Edouard M.D.   On: 09/11/2018 14:45   Scheduled Meds: . acyclovir  400 mg Oral BID  . allopurinol  300 mg Oral Daily  . cholecalciferol  2,000 Units Oral Daily  . enoxaparin (LOVENOX) injection  40 mg Subcutaneous Q24H  . feeding supplement (ENSURE ENLIVE)  237 mL Oral BID BM  . folic acid  1 mg Oral Daily  . mouth rinse  15 mL Mouth Rinse BID  . mupirocin ointment   Topical BID  . polyethylene glycol  17 g Oral Daily  . predniSONE  60 mg Oral Q breakfast  . senna-docusate  1 tablet Oral BID   Continuous Infusions: . sodium chloride 10 mL/hr at 09/09/18 2122  . cefTRIAXone (ROCEPHIN)  IV 1 g (09/10/18 2110)    LOS: 4 days   Kerney Elbe, DO [facility] PAGER is on [place]  If 7PM-7AM, please contact night-coverage [email or web address] Password [facility] 09/11/2018, 8:42 PM

## 2018-09-11 NOTE — Evaluation (Signed)
Physical Therapy Evaluation Patient Details Name: Patricia Murillo MRN: [id number] DOB: [birth year] Today's Date: 09/11/2018   History of Present Illness  72 y.o. 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  During her hospitalization she underwent left axillary lymph node biopsy. Pt admitted with LUE edema, weakness, dyspnea. 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   Clinical Impression  Pt initially did not want to participate, however with a little encouraging and movement of LEs, and RUE, pt began to become more alert and willing to help. Pt was really surprised to why her legs and arms would not work for her. Pt is really weak with very little active movement to have walked about 5 days ago. Everything was at least 3-/5 MMT or less and MaxA of 2 people for bed mobility. Feel pt would benefit from skilled PT if pt and family agree. Will continue to follow in acute care.     Follow Up Recommendations SNF(unsure of family's plan, but at this time would be need Max asssit and lift )    Equipment Recommendations       Recommendations for Other Services       Precautions / Restrictions Precautions Precautions: Fall      Mobility  Bed Mobility Overal bed mobility: Needs Assistance Bed Mobility: Rolling;Sidelying to Sit;Supine to Sit;Sit to Supine Rolling: +2 for physical assistance;Mod assist;Max assist Sidelying to sit: Mod assist;Max assist;+2 for physical assistance Supine to sit: +2 for physical assistance;Mod assist;Max assist Sit to supine: +2 for physical assistance;Mod assist;Max assist   General bed mobility comments: sat EOB for about 15 minutes. Static sitting with close supervison, not able to withstand any pertubations. sitting in flexed relaxed posture. Was able to do about 5 knee extension kicks with a rest in between  all of them. Bilaterally. pt became a little more talkative once sitting EOB, but still flat affect, "why am I like this"   Transfers                    Ambulation/Gait                Stairs            Wheelchair Mobility    Modified Rankin (Stroke Patients Only)       Balance Overall balance assessment: Needs assistance Sitting-balance support: Single extremity supported;Feet supported Sitting balance-Leahy Scale: Fair                                       Pertinent Vitals/Pain Pain Assessment: No/denies pain Pain Location: except in L hand and L UE from teh lymphadema  Pain Descriptors / Indicators: Sore;Discomfort;Guarding Pain Intervention(s): Limited activity within patient's tolerance;Monitored during session;Repositioned    Home Living Family/patient expects to be discharged to:: Private residence Living Arrangements: Alone Available Help at Discharge: Family;Available 24 hours/day(Dtr and son involved in care ) Type of Home: House Home Access: Stairs to enter Entrance Stairs-Rails: Right Entrance Stairs-Number of Steps: 3 Home Layout: One level Home Equipment: Clinical cytogeneticist - 2 wheels      Prior Function Level of Independence: Independent with assistive device(s)         Comments: Dtr reports pt was walking with RW in her home with family assisting with ADLs, etc,  until 5 days ago. Pt had becoming progressiviely weaker over the last 2 weeks.      Hand Dominance        Extremity/Trunk Assessment   Upper Extremity Assessment Upper Extremity Assessment: Defer to OT evaluation LUE Deficits / Details: very weak in R UE , grossly 2-/5 for all movement , LLE she could not movment at all independently    Lower Extremity Assessment Lower Extremity Assessment: RLE deficits/detail;LLE deficits/detail RLE Deficits / Details: noticeable muscle atrophy in entire LE. R with greater weakness than left. knee extension 3-/5  only able to attempt knee extension sitting EOB, not at all supine even with AAROM patterns. ankle 3/5, hip ab/ad 2+-5 LLE Deficits / Details: noticeable muscle atrophy in entire LE.  knee extension 3/5 only able to attempt knee extension sitting EOB, not at all supine even with AAROM patterns. ankle 3/5, hip ab/ad 2+-5       Communication   Communication: No difficulties  Cognition Arousal/Alertness: Awake/alert Behavior During Therapy: Flat affect(very slow to respond intially during session, then became a little mroe alert and responding with 1-2 word phrases ) Overall Cognitive Status: Within Functional Limits for tasks assessed                                        General Comments      Exercises Other Exercises Other Exercises: supine worked with B LE to see if I could provoke any muscle acitvity with B LE AAROM. Very little muscle activity in LEs. Worked in fully supported (cradled) postion , as well as hooklying to see if hip add would initiate. Worked with R UE with AAROM for any muscle activity as well, very weak. then sat EOB, see notes above.     Assessment/Plan    PT Assessment Patient needs continued PT services  PT Problem List Decreased strength;Decreased range of motion;Decreased activity tolerance;Decreased mobility;Decreased safety awareness       PT Treatment Interventions Functional mobility training;Therapeutic activities;Therapeutic exercise;Balance training;Patient/family education    PT Goals (Current goals can be found in the Care Plan section)  Acute Rehab PT Goals Patient Stated Goal: pt states " I want to go home, why am I like this"  PT Goal Formulation: With patient/family Time For Goal Achievement: 09/25/18 Potential to Achieve Goals: Fair    Frequency Min 2X/week   Barriers to discharge        Co-evaluation               AM-PAC PT "6 Clicks" Mobility  Outcome Measure Help needed turning from your back to your side  while in a flat bed without using bedrails?: Total Help needed moving from lying on your back to sitting on the side of a flat bed without using bedrails?: Total Help needed moving to and from a bed to a chair (including a wheelchair)?: Total Help needed standing up from a chair using your arms (e.g., wheelchair or bedside chair)?: Total Help needed to walk in hospital room?: Total Help needed climbing 3-5 steps with a railing? : Total 6 Click Score: 6    End of Session   Activity Tolerance: Patient limited by fatigue;Patient limited by lethargy Patient left: in bed;with call bell/phone within reach;with family/visitor present Nurse Communication: Mobility status PT Visit Diagnosis: Other abnormalities of gait and mobility (R26.89);Muscle weakness (generalized) (M62.81)    Time: 0109-3235 PT Time Calculation (min) (  ACUTE ONLY): 52 min   Charges:   PT Evaluation $PT Eval Low Complexity: 1 Low PT Treatments $Therapeutic Exercise: 8-22 mins $Therapeutic Activity: 8-22 mins        Clide Dales, PT Acute Rehabilitation Services Pager: [phone] Office: [phone] 09/11/2018   Clide Dales 09/11/2018, 6:26 PM

## 2018-09-11 NOTE — Evaluation (Signed)
Clinical/Bedside Swallow Evaluation Patient Details  Name: Patricia Murillo MRN: [id number] Date of Birth: [birth year]  Today's Date: 09/11/2018 Time: SLP Start Time (ACUTE ONLY): 7353 SLP Stop Time (ACUTE ONLY): 1433 SLP Time Calculation (min) (ACUTE ONLY): 29 min  Past Medical History:  Past Medical History:  Diagnosis Date  . Arthritis   . Hiatal hernia   . History of chicken pox   . Hyperlipidemia   . Pacemaker    Past Surgical History:  Past Surgical History:  Procedure Laterality Date  . ANKLE SURGERY    . PACEMAKER IMPLANT N/A 04/18/2018   Procedure: PACEMAKER IMPLANT;  Surgeon: Evans Lance, MD;  Location: [place] [facility];  Service: Cardiovascular;  Laterality: N/A;   HPI:  Patricia Murillo is a 72 y.o. 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  During her hospitalization she underwent left axillary lymph node biopsy.  She was discharged home in a stable condition, but apparently she has been developing worsening weakness, generalized, worse with exertion, no improving factors, associated with malaise, chills and poor appetite.  Over the last 24 hours her symptoms have been more severe to the point where she has difficulty standing. She has persistent left upper extremity edema which has been painful, sharp in nature up to 10 out of 10 intensity.  Her weakness has led to dyspnea which has been severe in intensity, and triggered her emergency room visit today.   Assessment / Plan / Recommendation Clinical Impression  Clinical swallowing evaluation was completed using ice chips, thin liquids via spoon and cup and pureed material.  Nursing reported coughing with intake this AM but also that the patient was altered from administration of pain medication.  She remains confused, unable to follow commands for cranial nerve exam.  The  patient presented with a possible oropharyngeal dysphagia.  The oral phase was marked by decreased bolus awareness with the patient requiring cues to swallow.  This led to delayed oral transit with oral holding.  The pharyngeal phase was marked by delayed cough response across textures.   Given clinical presentation recommend that she remain NPO.  ST will follow up next date to assess for PO readiness.  Suggest critical medications crushed in pureed material pending ST follow up.   SLP Visit Diagnosis: Dysphagia, unspecified (R13.10)    Aspiration Risk  Moderate aspiration risk    Diet Recommendation   NPO  Medication Administration: Critical meds crushed with puree    Other  Recommendations Oral Care Recommendations: Oral care QID   Follow up Recommendations Other (comment)(TBD)      Frequency and Duration min 2x/week  2 weeks       Prognosis Prognosis for Safe Diet Advancement: Good      Swallow Study   General Date of Onset: 09/07/18 HPI: Patricia Murillo is a 72 y.o. 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  During her hospitalization she underwent left axillary lymph node biopsy.  She was discharged home in a stable condition, but apparently she has been developing worsening weakness, generalized, worse with exertion, no improving factors, associated with malaise, chills and poor appetite.  Over the last 24 hours her symptoms have been more severe to the point where she has difficulty standing. She has  persistent left upper extremity edema which has been painful, sharp in nature up to 10 out of 10 intensity.  Her weakness has led to dyspnea which has been severe in intensity, and triggered her emergency room visit today. Type of Study: Bedside Swallow Evaluation Previous Swallow Assessment: None noted at [facility].   Diet Prior to this Study:  NPO Temperature Spikes Noted: No History of Recent Intubation: No Behavior/Cognition: Confused;Lethargic/Drowsy;Doesn't follow directions Oral Cavity Assessment: Within Functional Limits Oral Care Completed by SLP: No Self-Feeding Abilities: Total assist Patient Positioning: Upright in bed Baseline Vocal Quality: Normal Volitional Cough: Cognitively unable to elicit Volitional Swallow: Unable to elicit    Oral/Motor/Sensory Function     Ice Chips Ice chips: Impaired Presentation: Spoon Oral Phase Impairments: Poor awareness of bolus Oral Phase Functional Implications: Oral holding   Thin Liquid Thin Liquid: Impaired Presentation: Spoon;Cup Oral Phase Impairments: Poor awareness of bolus Oral Phase Functional Implications: Oral holding Pharyngeal  Phase Impairments: Suspected delayed Swallow;Cough - Delayed    Nectar Thick Nectar Thick Liquid: Not tested   Honey Thick Honey Thick Liquid: Not tested   Puree Puree: Impaired Presentation: Spoon Oral Phase Impairments: Poor awareness of bolus Oral Phase Functional Implications: Oral holding Pharyngeal Phase Impairments: Suspected delayed Swallow;Cough - Delayed   Solid     Solid: Not tested     Shelly Flatten, MA, CCC-SLP Acute Rehab SLP [phone]  Lamar Sprinkles 09/11/2018,2:39 PM

## 2018-09-12 ENCOUNTER — Inpatient Hospital Stay (HOSPITAL_COMMUNITY): Payer: Medicare HMO

## 2018-09-12 DIAGNOSIS — R5081 Fever presenting with conditions classified elsewhere: Secondary | ICD-10-CM

## 2018-09-12 LAB — CBC WITH DIFFERENTIAL/PLATELET
Abs Immature Granulocytes: 0.12 10*3/uL — ABNORMAL HIGH (ref 0.00–0.07)
Basophils Absolute: 0 10*3/uL (ref 0.0–0.1)
Basophils Relative: 0 %
Eosinophils Absolute: 0 10*3/uL (ref 0.0–0.5)
Eosinophils Relative: 0 %
HCT: 25.1 % — ABNORMAL LOW (ref 36.0–46.0)
Hemoglobin: 7.3 g/dL — ABNORMAL LOW (ref 12.0–15.0)
Immature Granulocytes: 1 %
Lymphocytes Relative: 1 %
Lymphs Abs: 0.1 10*3/uL — ABNORMAL LOW (ref 0.7–4.0)
MCH: 27.2 pg (ref 26.0–34.0)
MCHC: 29.1 g/dL — AB (ref 30.0–36.0)
MCV: 93.7 fL (ref 80.0–100.0)
Monocytes Absolute: 0.2 10*3/uL (ref 0.1–1.0)
Monocytes Relative: 2 %
Neutro Abs: 10.1 10*3/uL — ABNORMAL HIGH (ref 1.7–7.7)
Neutrophils Relative %: 96 %
Platelets: 66 10*3/uL — ABNORMAL LOW (ref 150–400)
RBC: 2.68 MIL/uL — ABNORMAL LOW (ref 3.87–5.11)
RDW: 19.9 % — ABNORMAL HIGH (ref 11.5–15.5)
WBC: 10.5 10*3/uL (ref 4.0–10.5)
nRBC: 0 % (ref 0.0–0.2)

## 2018-09-12 LAB — CULTURE, BLOOD (ROUTINE X 2)
Culture: NO GROWTH
Culture: NO GROWTH
SPECIAL REQUESTS: ADEQUATE
Special Requests: ADEQUATE

## 2018-09-12 LAB — COMPREHENSIVE METABOLIC PANEL
ALK PHOS: 60 U/L (ref 38–126)
ALT: 12 U/L (ref 0–44)
ANION GAP: 9 (ref 5–15)
AST: 17 U/L (ref 15–41)
Albumin: 2.3 g/dL — ABNORMAL LOW (ref 3.5–5.0)
BUN: 91 mg/dL — ABNORMAL HIGH (ref 8–23)
CO2: 22 mmol/L (ref 22–32)
Calcium: 10.3 mg/dL (ref 8.9–10.3)
Chloride: 108 mmol/L (ref 98–111)
Creatinine, Ser: 0.88 mg/dL (ref 0.44–1.00)
GFR calc Af Amer: >60 mL/min (ref >60)
GFR calc non Af Amer: >60 mL/min (ref >60)
Glucose, Bld: 115 mg/dL — ABNORMAL HIGH (ref 70–99)
Potassium: 5 mmol/L (ref 3.5–5.1)
SODIUM: 139 mmol/L (ref 135–145)
Total Bilirubin: 1.4 mg/dL — ABNORMAL HIGH (ref 0.3–1.2)
Total Protein: 4 g/dL — ABNORMAL LOW (ref 6.5–8.1)

## 2018-09-12 LAB — PHOSPHORUS: Phosphorus: 5.9 mg/dL — ABNORMAL HIGH (ref 2.5–4.6)

## 2018-09-12 LAB — MAGNESIUM: Magnesium: 2.6 mg/dL — ABNORMAL HIGH (ref 1.7–2.4)

## 2018-09-12 LAB — RASBURICASE - URIC ACID: Uric Acid, Serum: 7 mg/dL (ref 2.5–7.1)

## 2018-09-12 LAB — PREPARE RBC (CROSSMATCH)

## 2018-09-12 MED ORDER — FUROSEMIDE 10 MG/ML IJ SOLN
20.0000 mg | Freq: Once | INTRAMUSCULAR | Status: AC
  Administered 2018-09-12: 20 mg via INTRAVENOUS
  Filled 2018-09-12: qty 2

## 2018-09-12 MED ORDER — SODIUM CHLORIDE 0.9 % IV SOLN
1.5000 g | Freq: Four times a day (QID) | INTRAVENOUS | Status: DC
  Administered 2018-09-12 – 2018-09-15 (×10): 1.5 g via INTRAVENOUS
  Filled 2018-09-12 (×12): qty 1.5

## 2018-09-12 MED ORDER — SODIUM CHLORIDE 0.9% IV SOLUTION
Freq: Once | INTRAVENOUS | Status: AC
  Administered 2018-09-12: 20:00:00 via INTRAVENOUS

## 2018-09-12 MED ORDER — SODIUM CHLORIDE 0.9 % IV SOLN
INTRAVENOUS | Status: DC | PRN
  Administered 2018-09-28: 250 mL via INTRAVENOUS

## 2018-09-12 NOTE — Progress Notes (Signed)
OT Cancellation Note  Patient Details Name: Patricia Murillo MRN: [id number] DOB: 11-03-1945   Cancelled Treatment:    Reason Eval/Treat Not Completed: Patient at procedure or test/ unavailable  Pt gone for swallowing evaluation.  Will check back on pt later this day or next day as schedule allows.  Kari Baars, OT Acute Rehabilitation Services [phone] Office- [phone], Edwena Felty D 09/12/2018, 1:03 PM

## 2018-09-12 NOTE — Progress Notes (Signed)
  Speech Language Pathology Treatment: Dysphagia  Patient Details Name: Patricia Murillo MRN: [id number] DOB: [birth year] Today's Date: 09/12/2018 Time: 6168-3729 SLP Time Calculation (min) (ACUTE ONLY): 16 min  Assessment / Plan / Recommendation Clinical Impression  Pt today is alert, congested cough noted.  Observed pt with intake of ice chips, water and medication given with applesauce crushed.  Intermittent cough noted most prominently after intake of water concerning for possible aspiration.  Increased cough noted after cup bolus of water.  CN exam unremarkable but pt noted to have decreased eye opening on left eye - daughter reports this occurs when pt is ill.    SLP can not rule out aspiration and given pt is WEAK with bilateral pleural effusions, hiatal hernia with chronic small vessel ischemic changes per CT head - MBS indicated to assess oropharyngeal swallow function.  Pt denies dysphagia and family admits to coughing prior to admit.    MBS planned today at 1300 - RN, MD and pt/family aware and all agreeable.    HPI HPI: Patricia Murillo is a 72 y.o. 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  During her hospitalization she underwent left axillary lymph node biopsy.  She was discharged home in a stable condition, but apparently she has been developing worsening weakness, generalized, worse with exertion, no improving factors, associated with malaise, chills and poor appetite.  Over the last 24 hours her symptoms have been more severe to the point where she has difficulty standing. She has persistent left upper extremity edema which has been painful, sharp in nature up to 10 out of 10 intensity.  Her weakness has led to dyspnea which has been severe in intensity, and triggered her emergency room visit today.Pt was made NPO yesterday due to concerns  for aspiration.   Today pt is alert, has congested weak cough concerrning for airway protection.       SLP Plan  MBS(today)       Recommendations  Medication Administration: Crushed with puree                Oral Care Recommendations: Oral care QID Follow up Recommendations: Other (comment)(TBD) SLP Visit Diagnosis: Dysphagia, unspecified (R13.10) Plan: MBS(today)       GO                Patricia Murillo 09/12/2018, 10:56 AM Patricia Salk, MS [facility] SLP Acute Rehab Services Pager [phone] Office [phone]

## 2018-09-12 NOTE — NC FL2 (Signed)
[place] LEVEL OF CARE SCREENING TOOL     IDENTIFICATION  Patient Name: Patricia Murillo Birthdate: 05/26/1946 Sex: female Admission Date (Current Location): 09/07/2018  [facility] [place] Number:  Herbalist and Address:  [facility]  [address] [place], [place]      Provider Number: [id number]  Attending Physician Name and Address:  Kerney Elbe, DO  Relative Name and Phone Number:  Bary Leriche, daughter, [phone]    Current Level of Care: Hospital Recommended Level of Care: [place] Prior Approval Number:    Date Approved/Denied:   PASRR Number: [id number]  Discharge Plan: SNF    Current Diagnoses: Patient Active Problem List   Diagnosis Date Noted  . Malnutrition of moderate degree 09/08/2018  . 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 Hypercalcemia   . Respiratory failure ([place]) 1January 25, 202019  . Lymphoma ([place]) 1January 25, 202019  . Sepsis ([place])   . High grade B-cell lymphoma ([place])   . Left upper extremity swelling   . Metastatic cancer ([place])   . Hiatal hernia   . GI bleeding 08/29/2018  . Symptomatic anemia 08/29/2018  . Lymphedema of left arm 08/29/2018  . Complete heart block ([place]) 04/18/2018  . Allergic conjunctivitis of left eye 06/25/2017  . Rash of hands 10/12/2016  . Right ankle pain 08/31/2016  . Arthritis involving multiple sites 06/01/2016  . Skin abscess 02/28/2015  . 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 Hyperlipidemia 12/15/2012  . Vitamin D deficiency 12/15/2012  . Arthralgia of wrist 12/15/2012  . Arthralgia of knee 12/15/2012  . GERD (gastroesophageal reflux disease) 12/15/2012  . Hyperglycemia 09/12/2012    Orientation RESPIRATION BLADDER Height & Weight     Self, Time, Situation, Place  Normal Continent, External catheter Weight: 193 lb 9 oz (87.8 kg) Height:  5\' 6"  (167.6 cm)  BEHAVIORAL SYMPTOMS/MOOD NEUROLOGICAL BOWEL NUTRITION  STATUS      Continent Diet(liquids- see DC summary for updated diet)  AMBULATORY STATUS COMMUNICATION OF NEEDS Skin   Extensive Assist Verbally Normal                       Personal Care Assistance Level of Assistance  Bathing, Feeding, Dressing Bathing Assistance: Maximum assistance Feeding assistance: Independent Dressing Assistance: Maximum assistance     Functional Limitations Info  Sight, Hearing, Speech Sight Info: Adequate Hearing Info: Adequate Speech Info: Adequate    SPECIAL CARE FACTORS FREQUENCY  PT (By licensed PT), OT (By licensed OT)     PT Frequency: 5x OT Frequency: 3x            Contractures Contractures Info: Not present    Additional Factors Info  Code Status, Allergies Code Status Info: Full code Allergies Info: apixaban           Current Medications (09/12/2018):  This is the current hospital active medication list Current Facility-Administered Medications  Medication Dose Route Frequency Provider Last Rate Last Dose  . 0.9 %  sodium chloride infusion (Manually program via Guardrails IV Fluids)   Intravenous Once Sheikh, Omair Latif, DO      . 0.9 %  sodium chloride infusion   Intravenous PRN Raiford Noble Latif, DO 10 mL/hr at 09/09/18 2122    . acetaminophen (TYLENOL) tablet 650 mg  650 mg Oral Q6H PRN Arrien, Jimmy Picket, MD       Or  . acetaminophen (TYLENOL) suppository 650 mg  650 mg Rectal Q6H PRN Arrien,  Jimmy Picket, MD      . acyclovir (ZOVIRAX) 200 MG capsule 400 mg  400 mg Oral BID Alvy Bimler, Ni, MD   400 mg at 09/12/18 1022  . allopurinol (ZYLOPRIM) tablet 300 mg  300 mg Oral Daily Truitt Merle, MD   300 mg at 09/12/18 1023  . ampicillin-sulbactam (UNASYN) 1.5 g in sodium chloride 0.9 % 100 mL IVPB  1.5 g Intravenous Q6H Adrian Saran, RPH      . cholecalciferol (VITAMIN D) tablet 2,000 Units  2,000 Units Oral Daily Arrien, Jimmy Picket, MD   2,000 Units at 09/12/18 1022  . enoxaparin (LOVENOX) injection 40 mg  40 mg  Subcutaneous Q24H Tawni Millers, MD   40 mg at 09/11/18 2247  . feeding supplement (ENSURE ENLIVE) (ENSURE ENLIVE) liquid 237 mL  237 mL Oral BID BM Sheikh, Omair Latif, DO   237 mL at 19/14/78 2956  . folic acid (FOLVITE) tablet 1 mg  1 mg Oral Daily Truitt Merle, MD   1 mg at 09/12/18 1022  . guaiFENesin-dextromethorphan (ROBITUSSIN DM) 100-10 MG/5ML syrup 5 mL  5 mL Oral Q4H PRN Raiford Noble [place], DO      . HYDROcodone-acetaminophen (NORCO/VICODIN) 5-325 MG per tablet 1 tablet  1 tablet Oral Q6H PRN Arrien, Jimmy Picket, MD   1 tablet at 09/10/18 1844  . lidocaine (XYLOCAINE) 1 % (with pres) injection    PRN Greggory Keen, MD   10 mL at 09/09/18 0853  . lip balm (CARMEX) ointment   Topical PRN Arrien, Jimmy Picket, MD      . meclizine (ANTIVERT) tablet 12.5 mg  12.5 mg Oral TID PRN Arrien, Jimmy Picket, MD      . MEDLINE mouth rinse  15 mL Mouth Rinse BID Arrien, Jimmy Picket, MD   15 mL at 09/12/18 1054  . morphine 2 MG/ML injection 2 mg  2 mg Intravenous Q2H PRN Arrien, Jimmy Picket, MD   2 mg at 09/11/18 0432  . mupirocin ointment (BACTROBAN) 2 %   Topical BID Sheikh, Omair Latif, DO      . ondansetron [facility]) tablet 4 mg  4 mg Oral Q6H PRN Arrien, Jimmy Picket, MD       Or  . ondansetron [facility]) injection 4 mg  4 mg Intravenous Q6H PRN Arrien, Jimmy Picket, MD      . polyethylene glycol (MIRALAX / GLYCOLAX) packet 17 g  17 g Oral Daily [name], MD   17 g at 09/11/18 0954  . predniSONE (DELTASONE) tablet 60 mg  60 mg Oral Q breakfast Truitt Merle, MD   60 mg at 09/11/18 0954  . senna-docusate (Senokot-S) tablet 1 tablet  1 tablet Oral BID [name], MD   1 tablet at 09/12/18 1022     Discharge Medications: Please see discharge summary for a list of discharge medications.  Relevant Imaging Results:  Relevant Lab Results:   Additional Information SSN: [id number]. Receiving chemotherapy at [facility]- can provide schedule  Nila Nephew, LCSW

## 2018-09-12 NOTE — Progress Notes (Signed)
Patricia [place]   DOB:1946-03-03   MH#:[id number]   [phone]  Hem/Onc follow up note   Subjective: Pt tolerated chemotherapy well over the weekend, possible mild reaction to Rituxan, no other noticeable side effects.  She remains to be extremely fatigued, has been receiving some morphine for left arm pain, which makes her drowsy.  She is n.p.o. now, pending swallow evaluation.  Afebrile, vital signs otherwise stable.  She is eager to go home, but still bed-bounded.   Objective:  Vitals:   09/11/18 2128 09/12/18 0539  BP: 113/61 (!) 127/59  Pulse: 82 90  Resp: (!) 22 (!) 22  Temp: (!) 97.5 F (36.4 C) 98.5 F (36.9 C)  SpO2: 98% 98%    Body mass index is 31.24 kg/m.  Intake/Output Summary (Last 24 hours) at 09/12/2018 1240 Last data filed at 09/12/2018 0631 Gross per 24 hour  Intake -  Output 1200 ml  Net -1200 ml     Sclerae unicteric  Oropharynx clear  (+) bulky adenopathy at left axilla and left upper front chest   Lungs clear -- scatter wheezing and rhonchi through both lungs   Heart regular rate and rhythm  Abdomen benign, soft   MSK no focal spinal tenderness, (+) severe edema of the left upper arm and leg, and bilateral lower extremity  Neuro: alert, and follow commands. Pupil right bigger than left, both reactive, mild (+) ptosis of left eye. Her left upper extremity strength is significantly reduced. She moves all extremities    CBG (last 3)  No results for input(s): GLUCAP in the last 72 hours.   Labs:  Lab Results  Component Value Date   WBC 10.5 09/12/2018   HGB 7.3 (L) 09/12/2018   HCT 25.1 (L) 09/12/2018   MCV 93.7 09/12/2018   PLT 66 (L) 09/12/2018   NEUTROABS 10.1 (H) 09/12/2018   CMP Latest Ref Rng & Units 09/12/2018 09/11/2018 09/10/2018  Glucose 70 - 99 mg/dL 115(H) 102(H) 101(H)  BUN 8 - 23 mg/dL 91(H) 66(H) 47(H)  Creatinine 0.44 - 1.00 mg/dL 0.88 1.02(H) 0.94  Sodium 135 - 145 mmol/L 139 137 137  Potassium 3.5 - 5.1 mmol/L 5.0 4.9 4.5   Chloride 98 - 111 mmol/L 108 105 103  CO2 22 - 32 mmol/L 22 22 23   Calcium 8.9 - 10.3 mg/dL 10.3 11.1(H) 12.0(H)  Total Protein 6.5 - 8.1 g/dL 4.0(L) 4.3(L) 4.3(L)  Total Bilirubin 0.3 - 1.2 mg/dL 1.4(H) 2.1(H) 2.4(H)  Alkaline Phos 38 - 126 U/L 60 68 73  AST 15 - 41 U/L 17 25 24   ALT 0 - 44 U/L 12 16 15      Urine Studies No results for input(s): UHGB, CRYS in the last 72 hours.  Invalid input(s): UACOL, UAPR, USPG, UPH, UTP, UGL, UKET, UBIL, UNIT, UROB, [place], UEPI, UWBC, Duwayne Heck [place], [place]  Basic Metabolic Panel: Recent Labs  Lab 09/08/18 0337 09/09/18 0428 09/10/18 0500 09/11/18 0430 09/12/18 0615  NA 135 135 137 137 139  K 4.2 4.3 4.5 4.9 5.0  CL 101 103 103 105 108  CO2 24 21* 23 22 22   GLUCOSE 104* 155* 101* 102* 115*  BUN 29* 32* 47* 66* 91*  CREATININE 0.80 0.74 0.94 1.02* 0.88  CALCIUM 12.2* 11.9* 12.0* 11.1* 10.3  MG  --  1.9 2.0 2.1 2.6*  PHOS  --  3.2 3.3 4.5 5.9*   GFR Estimated Creatinine Clearance: 65.4 mL/min (by C-G formula based on SCr of 0.88 mg/dL). Liver Function Tests:  Recent Labs  Lab 09/07/18 0807 09/08/18 0337 09/09/18 0428 09/10/18 0500 09/11/18 0430 09/12/18 0615  AST 24  --  28 24 25 17   ALT 13  --  13 15 16 12   ALKPHOS 76  --  66 73 68 60  BILITOT 3.4* 2.0* 3.8* 2.4* 2.1* 1.4*  PROT 4.9*  --  4.3* 4.3* 4.3* 4.0*  ALBUMIN 2.5*  --  2.3* 2.4* 2.3* 2.3*   No results for input(s): LIPASE, AMYLASE in the last 168 hours. No results for input(s): AMMONIA in the last 168 hours. Coagulation profile Recent Labs  Lab 09/07/18 0807  INR 1.23    CBC: Recent Labs  Lab 09/07/18 0807 09/08/18 0337 09/09/18 0428 09/10/18 0500 09/11/18 0430 09/12/18 0615  WBC 14.7* 9.8 9.6 12.0* 12.8* 10.5  NEUTROABS 11.8*  --  8.3* 11.0* 11.8* 10.1*  HGB 8.2* 6.9* 8.5* 8.9* 8.5* 7.3*  HCT 27.5* 24.0*  20.9* 28.8* 30.1* 29.0* 25.1*  MCV 92.6 93.8 94.1 93.2 94.5 93.7  PLT 260 195 149* 120* 91* 66*   Cardiac Enzymes: No results for  input(s): CKTOTAL, CKMB, CKMBINDEX, TROPONINI in the last 168 hours. BNP: Invalid input(s): POCBNP CBG: No results for input(s): GLUCAP in the last 168 hours. D-Dimer No results for input(s): DDIMER in the last 72 hours. Hgb A1c No results for input(s): HGBA1C in the last 72 hours. Lipid Profile No results for input(s): CHOL, HDL, LDLCALC, TRIG, CHOLHDL, LDLDIRECT in the last 72 hours. Thyroid function studies No results for input(s): TSH, T4TOTAL, T3FREE, THYROIDAB in the last 72 hours.  Invalid input(s): FREET3 Anemia work up No results for input(s): VITAMINB12, FOLATE, FERRITIN, TIBC, IRON, RETICCTPCT in the last 72 hours. Microbiology Recent Results (from the past 240 hour(s))  Culture, blood (Routine x 2)     Status: None (Preliminary result)   Collection Time: 09/07/18  8:07 AM  Result Value Ref Range Status   Specimen Description   Final    BLOOD LEFT HAND Performed at [facility] [address], [place], [place] [address]    Special Requests   Final    Blood Culture adequate volume BOTTLES DRAWN AEROBIC AND ANAEROBIC   Culture   Final    NO GROWTH 4 DAYS Performed at [facility], [place] [address], [place], [place] [address]    Report Status PENDING  Incomplete  Culture, blood (Routine x 2)     Status: None (Preliminary result)   Collection Time: 09/07/18  8:18 AM  Result Value Ref Range Status   Specimen Description   Final    BLOOD RIGHT ANTECUBITAL Performed at [facility] [address], [place], [place] [address]    Special Requests   Final    BOTTLES DRAWN AEROBIC AND ANAEROBIC Blood Culture adequate volume Performed at [facility] [address] [place], [place] [address]    Culture   Final    NO GROWTH 4 DAYS Performed at [facility], [place] [address], [place], [place] [address]    Report Status PENDING  Incomplete  Urine culture     Status: None   Collection Time: 09/07/18  8:22  AM  Result Value Ref Range Status   Specimen Description   Final    URINE, CLEAN CATCH Performed at [facility], [place] [address] [place], [place] [address]    Special Requests   Final    NONE Performed at [facility], [place] [address]  [address] [place], [place] [address]    Culture   Final    NO GROWTH Performed at [facility], [place] [address], [place], [place] [address]    Report Status 09/08/2018 FINAL  Final      Studies:  Dg Chest Port 1 View  Result Date: 09/11/2018 CLINICAL DATA:  Shortness of breath. History of progressive high-grade B-cell lymphoma. EXAM: PORTABLE CHEST 1 VIEW COMPARISON:  Chest x-ray on 09/09/2018 and CT of the chest on 12020/08/2218. FINDINGS: Stable heart size, appearance of pacemaker and large hiatal hernia. Stable probable bilateral pleural effusions and bilateral pulmonary nodules. No overt pulmonary edema. No pneumothorax. IMPRESSION: Stable chest x-ray demonstrating pulmonary lymphoma and bilateral pleural effusions. Electronically Signed   By: Aletta Edouard M.D.   On: 09/11/2018 14:45    Assessment: 72 y.o. with newly diagnosed high-grade B-cell lymphoma, recently discharged from hospital after biopsy, presented to the emergency room with progressive weakness, dyspnea, chills and fever, anorexia, and extremity edema.  1. Sepsis, with fever, chill, possible pneumonia, improving  2.  Acute hypoxic respite failure, secondary to lymphoma in lungs, versus infections 3.  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 Hypercalcemia secondary to malignancy, s/p zometa 12/5 7.  Hyperbilirubinemia, secondary to hemolysis, imrproving  8. Severe arm and leg edema, secondary to severe adenopathy and  hypoalbuminemia 9. Deconditioning  10 left ptosis and dilated right pupil  11.  Worsening thrombocytopenia, secondary to lymphoma and chemotherapy 12.  Severe constipation  Plan:  -Her anemia got worse again today, likely related to chemo, her bilirubin has been improving, indicating hemolysis has improved.  Please consider 1 unit of RBC, irradiated, for today with Lasix. -Her platelets also dropped to 66k today, likely related to chemotherapy, will continue monitoring.  She will likely need platelet transfusion down the road  -Other lab appears to be stable, uric acid 7.0 today, she received 1 dose rasburicase over the weekend, continue allopurinol -Continue monitoring CBC, CMP, LDH, e;ectrolytes and uric acid daily -pending swallow evaluation -Plan discussed with Dr. Alfredia Ferguson and her daughters at bedside  Truitt Merle  09/12/2018

## 2018-09-12 NOTE — Progress Notes (Signed)
PROGRESS NOTE    STEHANIE Murillo  Patricia Murillo  Brief Narrative:  HPI Murillo Dr. Riccardo Dubin Arrien on 09/07/18  Patricia Murillo is a 72 y.o. 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  During her hospitalization she underwent left axillary lymph node biopsy.  She was discharged home in a stable condition, but apparently she has been developing worsening weakness, generalized, worse with exertion, no improving factors, associated with malaise, chills and poor appetite.  Over the last 24 hours her symptoms have been more severe to the point where she has difficulty standing. She has persistent left upper extremity edema which has been painful, sharp in nature up to 10 out of 10 intensity.  Her weakness has led to dyspnea which has been severe in intensity, and triggered her emergency room visit today.  ED Course: Patient was found ill looking appearing, febrile, positive leukocytosis, concern for systemic infection, referred for admission for evaluation.  **Oncology concerned about her progression of her high-grade B-cell lymphoma and is planning on starting chemotherapy today and have ordered a PICC line placement which was done by IR Dr. Annamaria Boots.  Sepsis physiology is improved.  Patient still remains hypercalcemic so oncology gave the patient Zometa and continued with IV fluidsbut have now discontinued. She started her Rituxan the day before yesterday but did not complete it because she had some mild infusion reactions.  The completion of her chemotherapy yesterday and this morning she is doing well but then she started coughing with her medication became slightly altered and confused after pain medication.  She is made n.p.o. and SLP evaluated and did an MBS which showed patient is aspirating.  SLP  recommended full liquid diet with known aspiration risks and will discuss with the family about pain to tube and tube feedings.  Patient's hemoglobin dropped so we will give another unit of PRBCs and will also give another dose of IV Lasix.  Will change antibiotics to IV Zosyn.  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  Likely related to progressive high-grade B-cell lymphoma with pulmonary involvement comp located by suspected aspiration -Continue oximetry monitoring and supplemental oxygen Murillo nasal cannula,  -IV fluids with isotonic saline to D5 normal saline rate of 75 mL's Murillo hour but now discontinued and college he gave IV Lasix yesterday -IV antibiotic therapy with IV ceftriaxone and IV Metronidazole was started. Will continue IV Ceftriaxone for now and D/C'd IV Flagyl and  -If in 24 hours no further evidence of pulmonary infection antibiotic therapy can be discontinued but will wait to De-escalate as patient now aspirated will change to IV Unasyn -Made n.p.o. And consulted SLP; SLP done and she is High Aspiration Risk but discussed with SLP and will place her on a FULL LIQUID DIET -Chest x-ray done and showed stable chest x-ray demonstrating pulmonary lymphoma and bilateral pleural effusions -Continue to Follow-up on cell count, cultures and temperature curve. -Wean O2 as Tolerated  -We will continue to follow SLP evaluations and may consider a PANDA Tube for feeding   High-grade B-cell lymphoma -Continue supportive medical therapy -IV  analgesics for left upper extremity pain; Head CT r/o  -Oncology consulted and recommending Starting R-CHOP and patient to get Rituxan and CHOP on different days -Oncology Dr. Burr Medico does not recommend a  bone marrow biopsy as this would not change restaging imaging and management -Dr. Burr Medico has ordered [facility] to rule out double hit or triple hit high-grade lymphoma -Patient to have PICC line placed tomorrow by IR start initiation of chemotherapy -Oncology gave the patient Solu-Medrol 500 mg every 12 for 2 doses today given her Hemoolysis -Rituxan was initiated a day before yesterday but unable to complete due to infusion reactions so will get repeat treatment with Benadryl, Tylenol and Steroids and remainder of Rituximab and CHOP received chemotherapy on 09/10/18. -Pain Control with Acetaminophen, Hydrocodone-Acetaminophen 1 tab po q6hprn, and IV Morphine 2 mg q2hprn -With her Anasarca from Cancer she is going to be given IV Lasix from Oncology and IV fluids have now been stopped; will be given a unit of PRBCs and another dose of IV Lasix  Acute encephalopathy likely in the setting of hypercalcemia -Became a little bit more altered today and confused -Continue monitor -Head CT noncontrast showed no evidence of any acute intracranial abnormality.  There is mild chronic small vessel ischemic changes in the cerebral white matter.  There is also mild chronic appearing paranasal sinusitis. -If not improving and calcium level is improved we will obtain an MRI -Patient likely also aspirated -We will make n.p.o. and SLP recommending MBS which was done.  And had MBS done and she is a very high aspiration risk.  Patient will be placed on a full liquid diet and will discuss with the family about pain to tube feeding and reevaluation from speech therapy -Continue monitor her mental status  Hypercalcemia likely of Malignancy -Calcium level was 10.3 today and corrected calcium likely higher than that due to albumin level -IV fluid is now been discontinued -Patient was given zoledronic acid IV 4 mg once yesterday Murillo oncology -Continue to Monitor   Iron deficiency anemia/Hemolysis from [place] -Patient's  hemoglobin/hematocrit went from 8.2/27.5 -> 6.9/24.2 -Continue with Ferrous Gluconate 324 mg po BID -Type and screen and transfuse 2 units of irradiated blood and will get another unit today; Blood Count now shows Hb/Hct of 7.3/25.1 -T bili is now 1.4 -Continue to Monitor for S/Sx of Bleeding  -Patient was started on IV Solumedrol 500 mg IV q12h by Oncology and will be placed on Steroids now p.o. 60 mg daily -We will give a dose of IV Lasix after blood is given -Repeat CBC in AM   Third-degree AV block s/p pacemaker -Patient currently has a V paced rhythm -Continue telemetry monitoring.  Obesity -Estimated body mass index is 31.24 kg/m as calculated from the following:   Height as of this encounter: 5' 6"  (1.676 m).   Weight as of this encounter: 87.8 kg. -Weight Loss Counseling given  Non-Severe (moderate) malnutrition in the context of acute illness/injury -Nutrition is consulted for further evaluation recommendations -Continue with Ensure Enlive p.o. twice daily  Left Arm Weakness and Swelling -Possibly secondary to high-grade lymphoma -Continue monitor and may need an MRI to rule out strokelike symptoms but will hold off for now -Oncology giving IV Lasix for Swelling and improved some  Pupil Discrepancy -Noted to be new -No Vision loss -Head CT Negative -Continue to Monitor   Thrombocytopenia -In the setting of Chemotherapy -Continue to Monitor Closely -Oncology Following   DVT prophylaxis: Enoxaparin 40 mg sq q24h but will hold  given Thrombocytopenia Code Status: FULL CODE Family Communication: Discussed with Son at bedside Disposition Plan: Remain Inpatient for Chemotherapy  Consultants:   Medical Oncology   Procedures:  PICC Line  R-CHOP Chemo to be initiated    Antimicrobials:  Anti-infectives (From admission, onward)   Start     Dose/Rate Route Frequency Ordered Stop   09/12/18 1600  ampicillin-sulbactam (UNASYN) 1.5 g in sodium chloride 0.9 % 100  mL IVPB     1.5 g 200 mL/hr over 30 Minutes Intravenous Every 6 hours 09/12/18 1408     09/10/18 1000  acyclovir (ZOVIRAX) 200 MG capsule 400 mg     400 mg Oral 2 times daily 09/10/18 0754     09/07/18 2000  cefTRIAXone (ROCEPHIN) 1 g in sodium chloride 0.9 % 100 mL IVPB  Status:  Discontinued     1 g 200 mL/hr over 30 Minutes Intravenous Every 24 hours 09/07/18 1844 09/12/18 0940   09/07/18 0830  ceFEPIme (MAXIPIME) 2 g in sodium chloride 0.9 % 100 mL IVPB     2 g 200 mL/hr over 30 Minutes Intravenous  Once 09/07/18 0820 09/07/18 0912   09/07/18 0830  metroNIDAZOLE (FLAGYL) IVPB 500 mg  Status:  Discontinued     500 mg 100 mL/hr over 60 Minutes Intravenous Every 8 hours 09/07/18 0820 09/08/18 1812   09/07/18 0830  vancomycin (VANCOCIN) IVPB 1000 mg/200 mL premix     1,000 mg 200 mL/hr over 60 Minutes Intravenous  Once 09/07/18 0820 09/07/18 1157     Subjective: Seen and examined at bedside and was more alert today and wanting to go home.  She is agitated that she is unable to eat.  No chest pain, lightheadedness or dizziness but still felt little short of breath.  Swelling has improved.  Objective: Vitals:   09/11/18 1418 09/11/18 2128 09/12/18 0539 09/12/18 1246  BP: 125/64 113/61 (!) 127/59 96/66  Pulse: 89 82 90 99  Resp: (!) 22 (!) 22 (!) 22   Temp: (!) 97.4 F (36.3 C) (!) 97.5 F (36.4 C) 98.5 F (36.9 C) 98.6 F (37 C)  TempSrc: Axillary Axillary Oral Oral  SpO2: 98% 98% 98% 96%  Weight:      Height:        Intake/Output Summary (Last 24 hours) at 09/12/2018 2005 Last data filed at 09/12/2018 1835 Gross Murillo 24 hour  Intake 782.15 ml  Output 1700 ml  Net -917.85 ml   Filed Weights   09/08/18 0505 09/09/18 0500  Weight: 89.3 kg 87.8 kg   Examination: Physical Exam:  Constitutional: Well-nourished, well-developed Caucasian female currently who appears fatigued and is agitated and does not appear uncomfortable Eyes: Lids and conjunctive normal.  Sclera  anicteric.  Has unequal pupil sizes ENMT: External ears and nose appear normal.  Has a small lesion on the tip of the nose.  Grossly normal hearing Neck: Appears supple no JVD Respiratory: Diminished to auscultation with coarse breath sounds.  Has no appreciable wheezing, rales, rhonchi.  Patient is not tachypneic or using any accessory muscles of breathing but is wearing supplemental oxygen via nasal cannula Cardiovascular: Regular rate and rhythm.  No appreciable murmurs, rubs, gallops.  Has lower extremity edema which is improving as well as upper arm extremity edema. Abdomen: Soft, nontender, distended secondary body habitus GU: Deferred Musculoskeletal: No contractures or cyanosis. Skin: Skin is warm and dry.  No appreciable rashes or lesions on limited skin evaluation Neurologic: Nerves II through XII grossly intact.  The pupil  is still smaller than right pupil.  Romberg sign and cerebellar reflexes were not assessed.  Globally weak Psychiatric: Agitated mood and was more awake today.  Not as confused  Data Reviewed: I have personally reviewed following labs and imaging studies  CBC: Recent Labs  Lab 09/07/18 0807 09/08/18 0337 09/09/18 0428 09/10/18 0500 09/11/18 0430 09/12/18 0615  WBC 14.7* 9.8 9.6 12.0* 12.8* 10.5  NEUTROABS 11.8*  --  8.3* 11.0* 11.8* 10.1*  HGB 8.2* 6.9* 8.5* 8.9* 8.5* 7.3*  HCT 27.5* 24.0*  20.9* 28.8* 30.1* 29.0* 25.1*  MCV 92.6 93.8 94.1 93.2 94.5 93.7  PLT 260 195 149* 120* 91* 66*   Basic Metabolic Panel: Recent Labs  Lab 09/08/18 0337 09/09/18 0428 09/10/18 0500 09/11/18 0430 09/12/18 0615  NA 135 135 137 137 139  K 4.2 4.3 4.5 4.9 5.0  CL 101 103 103 105 108  CO2 24 21* 23 22 22   GLUCOSE 104* 155* 101* 102* 115*  BUN 29* 32* 47* 66* 91*  CREATININE 0.80 0.74 0.94 1.02* 0.88  CALCIUM 12.2* 11.9* 12.0* 11.1* 10.3  MG  --  1.9 2.0 2.1 2.6*  PHOS  --  3.2 3.3 4.5 5.9*   GFR: Estimated Creatinine Clearance: 65.4 mL/min (by C-G formula  based on SCr of 0.88 mg/dL). Liver Function Tests: Recent Labs  Lab 09/07/18 0807 09/08/18 0337 09/09/18 0428 09/10/18 0500 09/11/18 0430 09/12/18 0615  AST 24  --  28 24 25 17   ALT 13  --  13 15 16 12   ALKPHOS 76  --  66 73 68 60  BILITOT 3.4* 2.0* 3.8* 2.4* 2.1* 1.4*  PROT 4.9*  --  4.3* 4.3* 4.3* 4.0*  ALBUMIN 2.5*  --  2.3* 2.4* 2.3* 2.3*   No results for input(s): LIPASE, AMYLASE in the last 168 hours. No results for input(s): AMMONIA in the last 168 hours. Coagulation Profile: Recent Labs  Lab 09/07/18 0807  INR 1.23   Cardiac Enzymes: No results for input(s): CKTOTAL, CKMB, CKMBINDEX, TROPONINI in the last 168 hours. BNP (last 3 results) No results for input(s): PROBNP in the last 8760 hours. HbA1C: No results for input(s): HGBA1C in the last 72 hours. CBG: No results for input(s): GLUCAP in the last 168 hours. Lipid Profile: No results for input(s): CHOL, HDL, LDLCALC, TRIG, CHOLHDL, LDLDIRECT in the last 72 hours. Thyroid Function Tests: No results for input(s): TSH, T4TOTAL, FREET4, T3FREE, THYROIDAB in the last 72 hours. Anemia Panel: No results for input(s): VITAMINB12, FOLATE, FERRITIN, TIBC, IRON, RETICCTPCT in the last 72 hours. Sepsis Labs: Recent Labs  Lab 09/07/18 0817 09/07/18 1056  LATICACIDVEN 2.84* 2.32*    Recent Results (from the past 240 hour(s))  Culture, blood (Routine x 2)     Status: None   Collection Time: 09/07/18  8:07 AM  Result Value Ref Range Status   Specimen Description   Final    BLOOD LEFT HAND Performed at [facility] [address] [place], [place] [address]    Special Requests   Final    Blood Culture adequate volume BOTTLES DRAWN AEROBIC AND ANAEROBIC   Culture   Final    NO GROWTH 5 DAYS Performed at [facility], [place] [address] [place], [place] [address]    Report Status 09/12/2018 FINAL  Final  Culture, blood (Routine x 2)     Status: None   Collection Time: 09/07/18  8:18 AM    Result Value Ref Range Status   Specimen  Description   Final    BLOOD RIGHT ANTECUBITAL Performed at [facility] [address], [place], [place] [address]    Special Requests   Final    BOTTLES DRAWN AEROBIC AND ANAEROBIC Blood Culture adequate volume Performed at [facility] [address], [place], [place] [address]    Culture   Final    NO GROWTH 5 DAYS Performed at [facility], [place] [address], [place], [place] [address]    Report Status 09/12/2018 FINAL  Final  Urine culture     Status: None   Collection Time: 09/07/18  8:22 AM  Result Value Ref Range Status   Specimen Description   Final    URINE, CLEAN CATCH Performed at [facility], [place] [address], [place], [place] [address]    Special Requests   Final    NONE Performed at [facility], [place] [address] [place], [place] [address]    Culture   Final    NO GROWTH Performed at [facility], [place] [address] [place], [place] [address]    Report Status 09/08/2018 FINAL  Final    Radiology Studies: Dg Chest Port 1 View  Result Date: 09/11/2018 CLINICAL DATA:  Shortness of breath. History of progressive high-grade B-cell lymphoma. EXAM: PORTABLE CHEST 1 VIEW COMPARISON:  Chest x-ray on 09/09/2018 and CT of the chest on [id number]. FINDINGS: Stable heart size, appearance of pacemaker and large hiatal hernia. Stable probable bilateral pleural effusions and bilateral pulmonary nodules. No overt pulmonary edema. No pneumothorax. IMPRESSION: Stable chest x-ray demonstrating pulmonary lymphoma and bilateral pleural effusions. Electronically Signed   By: Aletta Edouard M.D.   On: 09/11/2018 14:45   Dg Swallowing Func-speech Pathology  Result Date: 09/12/2018 Objective Swallowing Evaluation: Type of Study: MBS-Modified Barium Swallow Study  Patient Details Name: KHUSHBU PIPPEN MRN: [id number] Date of Birth: 1946-03-15 Today's Date:  09/12/2018 Time: SLP Start Time (ACUTE ONLY): 1300 -SLP Stop Time (ACUTE ONLY): 1337 SLP Time Calculation (min) (ACUTE ONLY): 37 min Past Medical History: Past Medical History: Diagnosis Date . Arthritis  . Hiatal hernia  . History of chicken pox  . Hyperlipidemia  . Pacemaker  Past Surgical History: Past Surgical History: Procedure Laterality Date . ANKLE SURGERY   . PACEMAKER IMPLANT N/A 04/18/2018  Procedure: PACEMAKER IMPLANT;  Surgeon: Evans Lance, MD;  Location: [facility];  Service: Cardiovascular;  Laterality: N/A; HPI: LUANA TATRO is a 72 y.o. 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  During her hospitalization she underwent left axillary lymph node biopsy.  She was discharged home in a stable condition, but apparently she has been developing worsening weakness, generalized, worse with exertion, no improving factors, associated with malaise, chills and poor appetite.  Over the last 24 hours her symptoms have been more severe to the point where she has difficulty standing. She has persistent left upper extremity edema which has been painful, sharp in nature up to 10 out of 10 intensity.  Her weakness has led to dyspnea which has been severe in intensity, and triggered her emergency room visit today.Pt was made NPO yesterday due to concerns for aspiration.   Today pt is alert, has congested weak cough concerrning for airway protection.  Subjective: The patient was seen sitting upright in bed with her daughter at the bedside.  Assessment / Plan / Recommendation CHL IP CLINICAL IMPRESSIONS 09/12/2018 Clinical Impression Pt presents with moderately severe oropharyngeal dysphagia with sensorimotor deficits.  Decreased oral propulsion and weak pharyngeal motility results in significant pharyngeal more than oral residuals.  Pt does NOT sense residuals and is  unable to "hock" to expectorate to clear them.  Chin tuck, head turn postures did NOT decrease residuals.  Pt did aspirate with thin liquids posterior trachea with delayed cough response.  Although she did not aspirate with thicker consistencies, increased residuals present which if aspiration post-swallow are more difficult for pulmonary clearance.  Pt is grossly weak at this time - uncertain of her baseline swallow function.  Pt is a high aspiration/malnutrition risk due to level of dysphagia and weak nonproductive cough.  If she is to consume po intake= it should be with accepted risks and mitigation strategies.  Question if pt's swallow with improve with improved medical status. SLP Visit Diagnosis Dysphagia, oropharyngeal phase (R13.12) Attention and concentration deficit following -- Frontal lobe and executive function deficit following -- Impact on safety and function Severe aspiration risk;Risk for inadequate nutrition/hydration   CHL IP TREATMENT RECOMMENDATION 09/11/2018 Treatment Recommendations Therapy as outlined in treatment plan below   Prognosis 09/12/2018 Prognosis for Safe Diet Advancement Fair Barriers to Reach Goals Severity of deficits;Cognitive deficits;Other (Comment) Barriers/Prognosis Comment -- CHL IP DIET RECOMMENDATION 09/12/2018 SLP Diet Recommendations (No Data) Liquid Administration via Cup Medication Administration Crushed with puree Compensations Slow rate;Small sips/bites;Follow solids with liquid Postural Changes Remain semi-upright after after feeds/meals (Comment);Seated upright at 90 degrees   CHL IP OTHER RECOMMENDATIONS 09/12/2018 Recommended Consults -- Oral Care Recommendations Oral care before and after PO Other Recommendations Have oral suction available   CHL IP FOLLOW UP RECOMMENDATIONS 09/12/2018 Follow up Recommendations (No Data)   CHL IP FREQUENCY AND DURATION 09/12/2018 Speech Therapy Frequency (ACUTE ONLY) min 2x/week Treatment Duration 1 week      CHL IP ORAL PHASE  09/12/2018 Oral Phase Impaired Oral - Pudding Teaspoon -- Oral - Pudding Cup -- Oral - Honey Teaspoon -- Oral - Honey Cup -- Oral - Nectar Teaspoon Weak lingual manipulation;Reduced posterior propulsion;Lingual/palatal residue Oral - Nectar Cup Weak lingual manipulation;Reduced posterior propulsion;Lingual/palatal residue Oral - Nectar Straw Weak lingual manipulation;Reduced posterior propulsion;Lingual/palatal residue Oral - Thin Teaspoon Weak lingual manipulation;Reduced posterior propulsion Oral - Thin Cup Weak lingual manipulation;Reduced posterior propulsion Oral - Thin Straw Reduced posterior propulsion Oral - Puree Weak lingual manipulation;Reduced posterior propulsion Oral - Mech Soft Weak lingual manipulation;Reduced posterior propulsion Oral - Regular -- Oral - Multi-Consistency -- Oral - Pill -- Oral Phase - Comment --  CHL IP PHARYNGEAL PHASE 09/12/2018 Pharyngeal Phase Impaired Pharyngeal- Pudding Teaspoon -- Pharyngeal -- Pharyngeal- Pudding Cup -- Pharyngeal -- Pharyngeal- Honey Teaspoon -- Pharyngeal -- Pharyngeal- Honey Cup -- Pharyngeal -- Pharyngeal- Nectar Teaspoon Reduced pharyngeal peristalsis;Reduced epiglottic inversion;Reduced tongue base retraction;Pharyngeal residue - valleculae;Pharyngeal residue - pyriform Pharyngeal -- Pharyngeal- Nectar Cup Reduced pharyngeal peristalsis;Reduced epiglottic inversion;Reduced laryngeal elevation;Reduced airway/laryngeal closure;Reduced tongue base retraction;Pharyngeal residue - valleculae;Pharyngeal residue - pyriform;Penetration/Aspiration during swallow Pharyngeal Material enters airway, remains ABOVE vocal cords and not ejected out Pharyngeal- Nectar Straw Reduced pharyngeal peristalsis;Reduced epiglottic inversion;Reduced anterior laryngeal mobility;Reduced airway/laryngeal closure;Reduced tongue base retraction;Reduced laryngeal elevation;Pharyngeal residue - valleculae;Pharyngeal residue - pyriform Pharyngeal -- Pharyngeal- Thin Teaspoon Reduced  pharyngeal peristalsis;Reduced epiglottic inversion;Reduced laryngeal elevation;Reduced airway/laryngeal closure;Reduced tongue base retraction;Pharyngeal residue - valleculae;Pharyngeal residue - pyriform Pharyngeal -- Pharyngeal- Thin Cup Reduced pharyngeal peristalsis;Reduced epiglottic inversion;Reduced laryngeal elevation;Reduced airway/laryngeal closure;Reduced tongue base retraction;Pharyngeal residue -  valleculae;Pharyngeal residue - pyriform;Penetration/Aspiration during swallow Pharyngeal Material enters airway, passes BELOW cords without attempt by patient to eject out (silent aspiration) Pharyngeal- Thin Straw Reduced pharyngeal peristalsis;Reduced epiglottic inversion;Reduced laryngeal elevation;Reduced airway/laryngeal closure;Reduced tongue base retraction;Pharyngeal residue - valleculae;Pharyngeal residue - pyriform Pharyngeal Material enters airway, passes BELOW cords and not ejected out despite cough attempt by patient Pharyngeal- Puree Reduced epiglottic inversion;Reduced pharyngeal peristalsis;Reduced tongue base retraction;Reduced airway/laryngeal closure;Reduced laryngeal elevation;Pharyngeal residue - valleculae;Pharyngeal residue - pyriform Pharyngeal -- Pharyngeal- Mechanical Soft Reduced pharyngeal peristalsis;Reduced epiglottic inversion;Reduced tongue base retraction;Pharyngeal residue - valleculae;Reduced airway/laryngeal closure Pharyngeal -- Pharyngeal- Regular -- Pharyngeal -- Pharyngeal- Multi-consistency -- Pharyngeal -- Pharyngeal- Pill -- Pharyngeal -- Pharyngeal Comment --  CHL IP CERVICAL ESOPHAGEAL PHASE 09/12/2018 Cervical Esophageal Phase Impaired Pudding Teaspoon -- Pudding Cup -- Honey Teaspoon -- Honey Cup -- Nectar Teaspoon -- Nectar Cup -- Nectar Straw -- Thin Teaspoon -- Thin Cup -- Thin Straw -- Puree -- Mechanical Soft -- Regular -- Multi-consistency -- Pill -- Cervical Esophageal Comment upon esophageal sweep, pt appeared clear Luanna Salk, MS [facility] SLP Acute Rehab  Services Pager [phone] Office [phone] Macario Golds 09/12/2018, 5:29 PM              Scheduled Meds: . sodium chloride   Intravenous Once  . acyclovir  400 mg Oral BID  . allopurinol  300 mg Oral Daily  . cholecalciferol  2,000 Units Oral Daily  . enoxaparin (LOVENOX) injection  40 mg Subcutaneous Q24H  . feeding supplement (ENSURE ENLIVE)  237 mL Oral BID BM  . folic acid  1 mg Oral Daily  . mouth rinse  15 mL Mouth Rinse BID  . mupirocin ointment   Topical BID  . polyethylene glycol  17 g Oral Daily  . predniSONE  60 mg Oral Q breakfast  . senna-docusate  1 tablet Oral BID   Continuous Infusions: . sodium chloride Stopped (09/12/18 1805)  . sodium chloride    . ampicillin-sulbactam (UNASYN) IV Stopped (09/12/18 1747)    LOS: 5 days   Kerney Elbe, DO [facility] PAGER is on [place]  If 7PM-7AM, please contact night-coverage [email or web address] Password [email or web address] 09/12/2018, 8:05 PM

## 2018-09-12 NOTE — Evaluation (Signed)
Occupational Therapy Evaluation Patient Details Name: Patricia Murillo MRN: [id number] DOB: 11-Nov-1945 Today's Date: 09/12/2018    History of Present Illness 72 y.o. 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  During her hospitalization she underwent left axillary lymph node biopsy. Pt admitted with LUE edema, weakness, dyspnea. 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    Clinical Impression   Pt admitted with respiratory failure. Pt currently with functional limitations due to the deficits listed below (see OT Problem List).  Pt will benefit from skilled OT to increase their safety and independence with ADL and functional mobility for ADL to facilitate discharge to venue listed below.      Follow Up Recommendations  SNF    Equipment Recommendations  None recommended by OT    Recommendations for Other Services       Precautions / Restrictions Precautions Precautions: Fall      Mobility Bed Mobility          NT        Transfers           NT            Balance                                           ADL either performed or assessed with clinical judgement   ADL Overall ADL's : Needs assistance/impaired Eating/Feeding: Maximal assistance;Bed level   Grooming: Wash/dry face;Maximal assistance;Bed level                                 General ADL Comments: Pt not able to lift arms due to edema.  With OT taking weight of arms pt able to bring hand to face                   Pertinent Vitals/Pain Pain Assessment: No/denies pain     Hand Dominance     Extremity/Trunk Assessment Upper Extremity Assessment Upper Extremity Assessment: RUE deficits/detail;LUE deficits/detail RUE Deficits / Details: BUE VERY weak.  Pts BUE with significant edema.  AAROM performed as  well as education provided to staff regarding elevating BUE on 2-3 pillows  RUE Coordination: decreased gross motor;decreased fine motor LUE Deficits / Details: BUE VERY weak.  Pts BUE with significant edema.  AAROM performed as well as education provided to staff regarding elevating BUE on 2-3 pillows  LUE Coordination: decreased fine motor;decreased gross motor           Communication Communication Communication: No difficulties   Cognition Arousal/Alertness: Awake/alert Behavior During Therapy: Flat affect(very slow to respond intially during session, then became a little mroe alert and responding with 1-2 word phrases ) Overall Cognitive Status: Within Functional Limits for tasks assessed                                                Home Living Family/patient expects to be discharged to:: Private residence Living Arrangements: Alone Available Help at Discharge: Family;Available 24 hours/day(Dtr and son involved in care ) Type of Home: [place]  Access: Stairs to enter [employer] of Steps: 3 Entrance Stairs-Rails: Right Home Layout: One level     Bathroom Shower/Tub: Teacher, early years/pre: Standard     Home Equipment: Clinical cytogeneticist - 2 wheels          Prior Functioning/Environment Level of Independence: Independent with assistive device(s)        Comments: Dtr reports pt was walking with RW in her home with family assisting with ADLs, etc, until 5 days ago. Pt had becoming progressiviely weaker over the last 2 weeks.         OT Problem List: Decreased strength;Decreased activity tolerance;Decreased range of motion;Impaired UE functional use;Increased edema      OT Treatment/Interventions: Self-care/ADL training;Patient/family education;DME and/or AE instruction;Therapeutic exercise;Therapeutic activities    OT Goals(Current goals can be found in the care plan section) Acute Rehab OT Goals Patient Stated Goal:  pt so fatigued this day- no goal stated Time For Goal Achievement: 09/26/18 ADL Goals Pt Will Perform Eating: with min assist;bed level Pt Will Perform Grooming: with min assist;bed level Pt/caregiver will Perform Home Exercise Program: Both right and left upper extremity;With minimal assist Additional ADL Goal #1: Pt, family and staff will keep BUE propped on pillows to decreased edema in BUE with S  OT Frequency: Min 2X/week    AM-PAC OT "6 Clicks" Daily Activity     Outcome Measure Help from another person eating meals?: Total Help from another person taking care of personal grooming?: Total Help from another person toileting, which includes using toliet, bedpan, or urinal?: Total Help from another person bathing (including washing, rinsing, drying)?: Total Help from another person to put on and taking off regular upper body clothing?: Total Help from another person to put on and taking off regular lower body clothing?: Total 6 Click Score: 6   End of Session Nurse Communication: Other (comment)(need for elevation BUE)  Activity Tolerance: Patient limited by fatigue;Patient limited by lethargy Patient left: in bed;with call bell/phone within reach;with bed alarm set  OT Visit Diagnosis: Unsteadiness on feet (R26.81);Muscle weakness (generalized) (M62.81)                Time: 1700-1715 OT Time Calculation (min): 15 min Charges:  OT General Charges $OT Visit: 1 Visit OT Evaluation $OT Eval Moderate Complexity: 1 Mod  Kari Baars, OT Acute Rehabilitation Services [phone] Office- [phone], Edwena Felty D 09/12/2018, 5:41 PM

## 2018-09-12 NOTE — Care Management Important Message (Signed)
Important Message  Patient Details  Name: Patricia Murillo MRN: [id number] Date of Birth: 05-31-1946   Medicare Important Message Given:  Yes    Kerin Salen 09/12/2018, 10:29 AMImportant Message  Patient Details  Name: Patricia Murillo MRN: [id number] Date of Birth: Apr 16, 1946   Medicare Important Message Given:  Yes    Kerin Salen 09/12/2018, 10:29 AM

## 2018-09-12 NOTE — Progress Notes (Signed)
MBS completed, Full report to follow.   Pt presents with moderately severe oropharyngeal dysphagia with sensorimotor deficits.  Decreased oral propulsion and weak pharyngeal motility results in significant pharyngeal more than oral residuals.  Pt does NOT sense residuals and is unable to "hock" to expectorate to clear them.  Chin tuck, head turn postures did NOT decrease residuals.  Pt did aspirate with thin liquids posterior trachea with delayed cough response.    Although she did not aspirate with thicker consistencies, increased residuals present which if aspiration post-swallow are more difficult for pulmonary clearance.  Pt is grossly weak at this time - uncertain of her baseline swallow function.    Pt is a high aspiration/malnutrition risk due to level of dysphagia and weak nonproductive cough.  If she is to consume po intake= it should be with accepted risks and mitigation strategies.  Question if pt's swallow with improve with improved medical status.    Luanna Salk, [place] [facility] SLP Acute Rehab Services Pager [phone] Office [phone]

## 2018-09-13 ENCOUNTER — Inpatient Hospital Stay (HOSPITAL_COMMUNITY): Payer: Medicare HMO

## 2018-09-13 LAB — COMPREHENSIVE METABOLIC PANEL
ALT: 15 U/L (ref 0–44)
AST: 26 U/L (ref 15–41)
Albumin: 2.5 g/dL — ABNORMAL LOW (ref 3.5–5.0)
Alkaline Phosphatase: 59 U/L (ref 38–126)
Anion gap: 9 (ref 5–15)
BUN: 100 mg/dL — ABNORMAL HIGH (ref 8–23)
CO2: 21 mmol/L — ABNORMAL LOW (ref 22–32)
Calcium: 9.7 mg/dL (ref 8.9–10.3)
Chloride: 110 mmol/L (ref 98–111)
Creatinine, Ser: 0.82 mg/dL (ref 0.44–1.00)
GFR calc Af Amer: >60 mL/min (ref >60)
Glucose, Bld: 135 mg/dL — ABNORMAL HIGH (ref 70–99)
POTASSIUM: 4.7 mmol/L (ref 3.5–5.1)
Sodium: 140 mmol/L (ref 135–145)
Total Bilirubin: 1.3 mg/dL — ABNORMAL HIGH (ref 0.3–1.2)
Total Protein: 4 g/dL — ABNORMAL LOW (ref 6.5–8.1)

## 2018-09-13 LAB — CBC
HCT: 25 % — ABNORMAL LOW (ref 36.0–46.0)
Hemoglobin: 7.7 g/dL — ABNORMAL LOW (ref 12.0–15.0)
MCH: 28.7 pg (ref 26.0–34.0)
MCHC: 30.8 g/dL (ref 30.0–36.0)
MCV: 93.3 fL (ref 80.0–100.0)
Platelets: 85 10*3/uL — ABNORMAL LOW (ref 150–400)
RBC: 2.68 MIL/uL — ABNORMAL LOW (ref 3.87–5.11)
RDW: 18.9 % — ABNORMAL HIGH (ref 11.5–15.5)
WBC: 14 10*3/uL — ABNORMAL HIGH (ref 4.0–10.5)
nRBC: 0 % (ref 0.0–0.2)

## 2018-09-13 LAB — LACTATE DEHYDROGENASE: LDH: 309 U/L — AB (ref 98–192)

## 2018-09-13 LAB — MAGNESIUM: MAGNESIUM: 2.7 mg/dL — AB (ref 1.7–2.4)

## 2018-09-13 LAB — RASBURICASE - URIC ACID: Uric Acid, Serum: 7 mg/dL (ref 2.5–7.1)

## 2018-09-13 LAB — PHOSPHORUS: Phosphorus: 5.7 mg/dL — ABNORMAL HIGH (ref 2.5–4.6)

## 2018-09-13 LAB — PREPARE RBC (CROSSMATCH)

## 2018-09-13 MED ORDER — SODIUM CHLORIDE 0.9% IV SOLUTION: Freq: Once | INTRAVENOUS | Status: DC

## 2018-09-13 MED ORDER — PREDNISONE 20 MG PO TABS
40.0000 mg | ORAL_TABLET | Freq: Every day | ORAL | Status: DC
  Administered 2018-09-14 – 2018-09-22 (×9): 40 mg via ORAL
  Filled 2018-09-13 (×9): qty 2

## 2018-09-13 MED ORDER — FUROSEMIDE 10 MG/ML IJ SOLN
20.0000 mg | Freq: Once | INTRAMUSCULAR | Status: AC
  Administered 2018-09-14: 20 mg via INTRAVENOUS
  Filled 2018-09-13: qty 2

## 2018-09-13 NOTE — Clinical Social Work Note (Signed)
Clinical Social Work Assessment  Patient Details  Name: Patricia Murillo MRN: [id number] Date of Birth: 1946-08-22  Date of referral:  09/13/18               Reason for consult:  Facility Placement                Permission sought to share information with:  Family Supports Permission granted to share information::  Yes, Verbal Permission Granted  Name::     daughter Patricia Murillo  Agency::     Relationship::     Contact Information:     Housing/Transportation Living arrangements for the past 2 months:  Apartment Source of Information:  Patient, Adult Children Patient Interpreter Needed:  None Criminal Activity/Legal Involvement Pertinent to Current Situation/Hospitalization:  No - Comment as needed Significant Relationships:  Adult Children, Other Family Members Lives with:  Self Do you feel safe going back to the place where you live?  Yes Need for family participation in patient care:  Yes (Comment)(daughters)  Care giving concerns:  Pt admitted from home where she resides alone. Prior to this diagnosis (lymphoma) pt reports she was active and independent. After her recent other admission pt's daughter reports she and siblings set up a 24/7 supervision schedule to ensure pt had support post DC. Pt had first chemotherapy while inpatient. States plan is she will have every 3 weeks. Pt having issues with aspirating and currently on full liquid diet- daughter states they are now considering feeding tube.  Social Worker assessment / plan:  CSW consulted for assistance with disposition. Met with pt at bedside - they are hopeful that pt will improve clinically and functionally enough to return home at DC. Pt very upset to consider any alternative to home, however agreed to referrals to SNFs in order to investigate options should her care needs indicate that home not safe option at DC. Will continue following during hospitalization to coordinate DC needs.  Employment status:   Retired Lawyer) PT Recommendations:  [place] / Referral to community resources:  [place]  Patient/Family's Response to care:  appreciative  Patient/Family's Understanding of and Emotional Response to Diagnosis, Current Treatment, and Prognosis:  Pt was lethargic and did not discuss treatment in depth. Daughter however very informed and understanding. Express "My siblings and I are really hoping we can plan to take her home."  Emotional Assessment Appearance:  Appears stated age Attitude/Demeanor/Rapport:  Lethargic Affect (typically observed):  Calm, Pleasant Orientation:  Oriented to Self, Oriented to Place, Oriented to  Time, Oriented to Situation Alcohol / Substance use:  Not Applicable Psych involvement (Current and /or in the community):  No (Comment)  Discharge Needs  Concerns to be addressed:  Discharge Planning Concerns, Care Coordination, Decision making concerns Readmission within the last 30 days:  No Current discharge risk:  (assessing) Barriers to Discharge:  Continued Medical Work up   [employer], LCSW 09/13/2018, 2:23 PM [phone]

## 2018-09-13 NOTE — Progress Notes (Signed)
See rapid response note.

## 2018-09-13 NOTE — Progress Notes (Signed)
Nutrition Follow-up  DOCUMENTATION CODES:   Non-severe (moderate) malnutrition in context of acute illness/injury  INTERVENTION:   Continue Ensure Enlive po BID, each supplement provides 350 kcal and 20 grams of protein  If tube feeding desired: Recommend Osmolite 1.5 @ 20 ml/hr. Advance by 10 ml every 4 hours to goal rate of 55 ml/hr. This provides 1980 kcal, 82g protein and 1005 ml H2O.  NUTRITION DIAGNOSIS:   Moderate Malnutrition related to acute illness, poor appetite as evidenced by percent weight loss, energy intake < or equal to 75% for > or equal to 1 month, mild muscle depletion.  Ongoing.  GOAL:   Patient will meet greater than or equal to 90% of their needs  Not meeting.  MONITOR:   PO intake, Supplement acceptance, Labs, Weight trends, I & O's  ASSESSMENT:   72 y.o. 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  During her hospitalization she underwent left axillary lymph node biopsy.  She was discharged home in a stable condition, but apparently she has been developing worsening weakness, generalized, worse with exertion, no improving factors, associated with malaise, chills and poor appetite.  Patient consumed ~50% of breakfast this morning and accepted a Ensure supplement. SLP evaluated pt 12/9, pt presents with mod-severe dysphagia. SLP feels that pt is aspirating and recommends full liquid diet with precautions. SLP to discuss TF with patient and family. Will monitor for decisions and provide recommendations above.    Per weight records, pt's weight is -5 lb since admission. Pt still with moderate-severe edema.  Medications: Vitamin D tablet daily, folic acid tablet daily, Miralax packet daily, Senokot-S tablet BID Labs reviewed: Elevated Mg/Phos  Diet Order:   Diet Order            Diet full liquid Room service  appropriate? Yes; Fluid consistency: Thin  Diet effective now              EDUCATION NEEDS:   Education needs have been addressed  Skin:  Skin Assessment: Reviewed RN Assessment  Last BM:  12/10  Height:   Ht Readings from Last 1 Encounters:  09/08/18 5\' 6"  (1.676 m)    Weight:   Wt Readings from Last 1 Encounters:  09/13/18 86.7 kg    Ideal Body Weight:  59.1 kg  BMI:  Body mass index is 30.85 kg/m.  Estimated Nutritional Needs:   Kcal:  1700-1900  Protein:  90-100g  Fluid:  1.7L/day  Clayton Bibles, MS, RD, LDN [place] Dietitian Pager: [phone] After Hours Pager: [phone]

## 2018-09-13 NOTE — Progress Notes (Addendum)
PROGRESS NOTE    Patricia Murillo  OOI:[id number] DOB: [birth year] DOA: 09/07/2018 PCP: Patient, No Pcp Per  Brief Narrative:  HPI per Dr. Riccardo Dubin Arrien on 09/07/18  Patricia Murillo is a 72 y.o. 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  During her hospitalization she underwent left axillary lymph node biopsy.  She was discharged home in a stable condition, but apparently she has been developing worsening weakness, generalized, worse with exertion, no improving factors, associated with malaise, chills and poor appetite.  Over the last 24 hours her symptoms have been more severe to the point where she has difficulty standing. She has persistent left upper extremity edema which has been painful, sharp in nature up to 10 out of 10 intensity.  Her weakness has led to dyspnea which has been severe in intensity, and triggered her emergency room visit today.  ED Course: Patient was found ill looking appearing, febrile, positive leukocytosis, concern for systemic infection, referred for admission for evaluation.  **Oncology concerned about her progression of her high-grade B-cell lymphoma and is planning on starting chemotherapy today and have ordered a PICC line placement which was done by IR Dr. Annamaria Boots.  Sepsis physiology is improved.  Patient still remains hypercalcemic so oncology gave the patient Zometa and continued with IV fluidsbut have now discontinued. She started her Rituxan the day before yesterday but did not complete it because she had some mild infusion reactions.  The completion of her chemotherapy yesterday and this morning she is doing well but then she started coughing with her medication became slightly altered and confused after pain medication.  She is made n.p.o. and SLP evaluated and did an MBS which showed patient is aspirating.  SLP  recommended full liquid diet with known aspiration risks and will discuss with the family about pain to tube and tube feedings.  Patient's hemoglobin dropped so we will give another unit of PRBCs and will also give another dose of IV Lasix.  Will change antibiotics to IV Unasyn.  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  Likely related to progressive high-grade B-cell lymphoma with pulmonary involvement comp located by suspected aspiration -Continue oximetry monitoring and supplemental oxygen per nasal cannula,  -IVF discontinued and received Lasix -IV antibiotic therapy with IV ceftriaxone and IV Metronidazole was started. Will continue IV Ceftriaxone for now and D/C'd IV Flagyl and  -If in 24 hours no further evidence of pulmonary infection antibiotic therapy can be discontinued but will wait to De-escalate as patient now aspirated will change to IV Unasyn -Made n.p.o. And consulted SLP; SLP done and she is High Aspiration Risk but discussed with SLP and will place her on a FULL LIQUID DIET; Discussed with the family and ok with Cortrak/PANDA tube so will place consult for that -IR consulted for Cortrak -Chest x-ray done and showed stable chest x-ray demonstrating pulmonary lymphoma and bilateral pleural effusions -Continue to Follow-up on cell count, cultures and temperature curve. -Wean O2 as Tolerated  -We will continue to follow SLP evaluations and may consider a PANDA Tube for feeding and agreeable so will place IR Consult   High-grade B-cell  Lymphoma -Continue supportive medical therapy -IV analgesics for left upper extremity pain; Head CT r/o  -Oncology consulted and recommending Starting R-CHOP and patient to get Rituxan and CHOP on  different days -Oncology Dr. Burr Medico does not recommend a bone marrow biopsy as this would not change restaging imaging and management -Dr. Burr Medico has ordered [facility] to rule out double hit or triple hit high-grade lymphoma -PICC placed for  -Solumedrol started for Hemolysis and now on po Prednisone -Rituxan was initiated a day before yesterday but unable to complete due to infusion reactions so will get repeat treatment with Benadryl, Tylenol and Steroids and remainder of Rituximab and CHOP received chemotherapy on 09/10/18. -Pain Control with Acetaminophen, Hydrocodone-Acetaminophen 1 tab po q6hprn, and IV Morphine 2 mg q2hprn -With her Anasarca from Cancer she is going to be given IV Lasix from Oncology and IV fluids have now been stopped; will be given another  a unit of PRBCs and another dose of IV Lasix today   Acute encephalopathy likely in the setting of hypercalcemia, Hypercalcemia is improving but still remains some what encephalopathic. -Continue monitor -Head CT noncontrast showed no evidence of any acute intracranial abnormality.  There is mild chronic small vessel ischemic changes in the cerebral white matter.  There is also mild chronic appearing paranasal sinusitis. -If not improving and calcium level is improved we will obtain an MRI; Dr. Burr Medico Recommending MRI to r/o CNS lymphoma so have ordered it but unfortunately patient has a Pacemaker so cannot be done -Patient likely also aspirated this admission -We will make n.p.o. and SLP recommending MBS which was done.  And had MBS done and she is a very high aspiration risk.  Patient will be placed on a full liquid diet and will discuss with the family about pain to tube feeding and reevaluation from speech therapy; See Above -Continue monitor her mental status  Hypercalcemia likely of Malignancy -Calcium level was 9.7 today and corrected calcium likely higher than that due to albumin level -IV fluid is now been discontinued -Patient was given  zoledronic acid IV 4 mg once yesterday per oncology -Continue to Monitor   Iron deficiency anemia/Hemolysis from [place] -Patient's hemoglobin/hematocrit went from 8.2/27.5 -> 6.9/24.2 -Continue with Ferrous Gluconate 324 mg po BID -Typed and screened and transfuse 3 units of irradiated blood and will get another unit today; Blood Count now shows Hb/Hct of 7.7/25.0 -T bili is now 1.3 and LDH 309 -Continue to Monitor for S/Sx of Bleeding  -C/w IV Steroids per Oncology  -We will give a dose of IV Lasix after blood is given -Repeat CBC in AM   Third-degree AV block s/p pacemaker -Patient currently has a V paced rhythm -Continue telemetry monitoring.  Obesity -Estimated body mass index is 30.85 kg/m as calculated from the following:   Height as of this encounter: 5' 6"  (1.676 m).   Weight as of this encounter: 86.7 kg. -Weight Loss Counseling given  Non-Severe (moderate) malnutrition in the context of acute illness/injury -Nutrition is consulted for further evaluation recommendations -Continue with Ensure Enlive p.o. twice daily -Will need PANDA Tube for feedings   Left Arm Weakness and Swelling -Possibly secondary to high-grade lymphoma -Continue monitor and may need an MRI to rule out strokelike symptoms but will hold off for now -Oncology giving IV Lasix for Swelling and improved some  Pupil Discrepancy -Noted to be new -No Vision loss -Head CT Negative -Continue to Monitor   Thrombocytopenia -In the setting of Chemotherapy; Platelet Count is now 85 -  Continue to Monitor Closely -Oncology Following   Elevated BUN -In the setting of Steroids -Continue to Monitor as it is worsening and repeat CMP in AM   Leukocytosis -Likely reactive -Continue to Monitor for S/Sx of Infection as she is on IV Unasyn -Continue to Monitor and repeat CBC in AM   DVT prophylaxis: Enoxaparin 40 mg sq q24h but will hold given Thrombocytopenia Code Status: FULL CODE Family  Communication: Discussed with Daughter at bedside Disposition Plan: Remain Inpatient for Chemotherapy  Consultants:   Medical Oncology   Procedures:  PICC Line  R-CHOP Chemo to be initiated    Antimicrobials:  Anti-infectives (From admission, onward)   Start     Dose/Rate Route Frequency Ordered Stop   09/12/18 1600  ampicillin-sulbactam (UNASYN) 1.5 g in sodium chloride 0.9 % 100 mL IVPB     1.5 g 200 mL/hr over 30 Minutes Intravenous Every 6 hours 09/12/18 1408     09/10/18 1000  acyclovir (ZOVIRAX) 200 MG capsule 400 mg     400 mg Oral 2 times daily 09/10/18 0754     09/07/18 2000  cefTRIAXone (ROCEPHIN) 1 g in sodium chloride 0.9 % 100 mL IVPB  Status:  Discontinued     1 g 200 mL/hr over 30 Minutes Intravenous Every 24 hours 09/07/18 1844 09/12/18 0940   09/07/18 0830  ceFEPIme (MAXIPIME) 2 g in sodium chloride 0.9 % 100 mL IVPB     2 g 200 mL/hr over 30 Minutes Intravenous  Once 09/07/18 0820 09/07/18 0912   09/07/18 0830  metroNIDAZOLE (FLAGYL) IVPB 500 mg  Status:  Discontinued     500 mg 100 mL/hr over 60 Minutes Intravenous Every 8 hours 09/07/18 0820 09/08/18 1812   09/07/18 0830  vancomycin (VANCOCIN) IVPB 1000 mg/200 mL premix     1,000 mg 200 mL/hr over 60 Minutes Intravenous  Once 09/07/18 0820 09/07/18 1157     Subjective: Seen and examined at bedside and states her mattress was uncomfortable.  Thinks she is doing a little bit better.  No chest pain, lightheadedness and shortness of breath stable.  Leg swelling and swelling in her arms and legs is improved.  No other concerns otherwise at this time but was wanting to eat and discussed with her about full liquid diet and the pain to tube and she is agreeable after discussion with her family.  Objective: Vitals:   09/12/18 2030 09/12/18 2300 09/13/18 0631 09/13/18 1359  BP: (!) 146/67 (!) 170/70 (!) 128/54 101/62  Pulse: 94 91 94 94  Resp: 20 (!) 22 17 16   Temp: 97.6 F (36.4 C) 97.8 F (36.6 C) 97.8 F  (36.6 C) 97.8 F (36.6 C)  TempSrc: Axillary Axillary Oral Oral  SpO2: 99% 96% 100% 98%  Weight:   86.7 kg   Height:        Intake/Output Summary (Last 24 hours) at 09/13/2018 1739 Last data filed at 09/13/2018 1512 Gross per 24 hour  Intake 1533.87 ml  Output 550 ml  Net 983.87 ml   Filed Weights   09/08/18 0505 09/09/18 0500 09/13/18 0631  Weight: 89.3 kg 87.8 kg 86.7 kg   Examination: Physical Exam:  Constitutional: Well-nourished, well-developed Caucasian female currently appears very fatigued and is somewhat anxious and uncomfortable sitting the chair Eyes: Lids and conjunctive are normal.  Sclera anicteric.  Still has unequal pupil sizes ENMT: External ears and nose appear normal.  Has a small lesion on the tip of her nose.  Grossly normal hearing Neck:  Appears supple with no JVD but has a fullness on the left side of her neck Respiratory: Diminished to auscultation bilaterally with coarse breath sounds.  Has no appreciable wheezing, rales, rhonchi.  Patient not tachypneic wheezing excess muscle breathe but is still wearing supplemental oxygen via nasal cannula Cardiovascular: Regular rate and rhythm.  Has lower extremity edema which is improving as well as upper extremity edema Abdomen: Soft, nontender, distended second body habitus.  Bowel sounds present GU: Deferred Musculoskeletal: No appreciable contractures cyanosis Skin: Skin is warm dry no appreciable rashes or lesions limited skin evaluation Neurologic: Cranial nerves II through XII grossly intact.  Pupil on the right is still bigger than the left one.  Romberg sign and cerebellar reflexes were not assessed the patient is globally weak Psychiatric: Anxious mood and she is more awake today.  Intermittently confused  Data Reviewed: I have personally reviewed following labs and imaging studies  CBC: Recent Labs  Lab 09/07/18 0807  09/09/18 0428 09/10/18 0500 09/11/18 0430 09/12/18 0615 09/13/18 1133  WBC  14.7*   < > 9.6 12.0* 12.8* 10.5 14.0*  NEUTROABS 11.8*  --  8.3* 11.0* 11.8* 10.1*  --   HGB 8.2*   < > 8.5* 8.9* 8.5* 7.3* 7.7*  HCT 27.5*   < > 28.8* 30.1* 29.0* 25.1* 25.0*  MCV 92.6   < > 94.1 93.2 94.5 93.7 93.3  PLT 260   < > 149* 120* 91* 66* 85*   < > = values in this interval not displayed.   Basic Metabolic Panel: Recent Labs  Lab 09/09/18 0428 09/10/18 0500 09/11/18 0430 09/12/18 0615 09/13/18 1133  NA 135 137 137 139 140  K 4.3 4.5 4.9 5.0 4.7  CL 103 103 105 108 110  CO2 21* 23 22 22  21*  GLUCOSE 155* 101* 102* 115* 135*  BUN 32* 47* 66* 91* 100*  CREATININE 0.74 0.94 1.02* 0.88 0.82  CALCIUM 11.9* 12.0* 11.1* 10.3 9.7  MG 1.9 2.0 2.1 2.6* 2.7*  PHOS 3.2 3.3 4.5 5.9* 5.7*   GFR: Estimated Creatinine Clearance: 69.8 mL/min (by C-G formula based on SCr of 0.82 mg/dL). Liver Function Tests: Recent Labs  Lab 09/09/18 0428 09/10/18 0500 09/11/18 0430 09/12/18 0615 09/13/18 1133  AST 28 24 25 17 26   ALT 13 15 16 12 15   ALKPHOS 66 73 68 60 59  BILITOT 3.8* 2.4* 2.1* 1.4* 1.3*  PROT 4.3* 4.3* 4.3* 4.0* 4.0*  ALBUMIN 2.3* 2.4* 2.3* 2.3* 2.5*   No results for input(s): LIPASE, AMYLASE in the last 168 hours. No results for input(s): AMMONIA in the last 168 hours. Coagulation Profile: Recent Labs  Lab 09/07/18 0807  INR 1.23   Cardiac Enzymes: No results for input(s): CKTOTAL, CKMB, CKMBINDEX, TROPONINI in the last 168 hours. BNP (last 3 results) No results for input(s): PROBNP in the last 8760 hours. HbA1C: No results for input(s): HGBA1C in the last 72 hours. CBG: No results for input(s): GLUCAP in the last 168 hours. Lipid Profile: No results for input(s): CHOL, HDL, LDLCALC, TRIG, CHOLHDL, LDLDIRECT in the last 72 hours. Thyroid Function Tests: No results for input(s): TSH, T4TOTAL, FREET4, T3FREE, THYROIDAB in the last 72 hours. Anemia Panel: No results for input(s): VITAMINB12, FOLATE, FERRITIN, TIBC, IRON, RETICCTPCT in the last 72  hours. Sepsis Labs: Recent Labs  Lab 09/07/18 0817 09/07/18 1056  LATICACIDVEN 2.84* 2.32*    Recent Results (from the past 240 hour(s))  Culture, blood (Routine x 2)     Status:  None   Collection Time: 09/07/18  8:07 AM  Result Value Ref Range Status   Specimen Description   Final    BLOOD LEFT HAND Performed at [facility], [place] [address] [place], [place] [address]    Special Requests   Final    Blood Culture adequate volume BOTTLES DRAWN AEROBIC AND ANAEROBIC   Culture   Final    NO GROWTH 5 DAYS Performed at [facility], [place] [address] [place], [place] [address]    Report Status 09/12/2018 FINAL  Final  Culture, blood (Routine x 2)     Status: None   Collection Time: 09/07/18  8:18 AM  Result Value Ref Range Status   Specimen Description   Final    BLOOD RIGHT ANTECUBITAL Performed at [facility] [address], [place], [place] [address]    Special Requests   Final    BOTTLES DRAWN AEROBIC AND ANAEROBIC Blood Culture adequate volume Performed at [facility] [address] [place], [place] [address]    Culture   Final    NO GROWTH 5 DAYS Performed at [facility], [place] [address] [place], [place] [address]    Report Status 09/12/2018 FINAL  Final  Urine culture     Status: None   Collection Time: 09/07/18  8:22 AM  Result Value Ref Range Status   Specimen Description   Final    URINE, CLEAN CATCH Performed at [facility], [place] [address], [place], [place] [address]    Special Requests   Final    NONE Performed at [facility], [place] [address] [place], [place] [address]    Culture   Final    NO GROWTH Performed at [facility], [place] [address], [place], [place] [address]    Report Status 09/08/2018 FINAL  Final    Radiology Studies: Dg Swallowing Func-speech Pathology  Result Date: 09/12/2018 Objective Swallowing Evaluation: Type of  Study: MBS-Modified Barium Swallow Study  Patient Details Name: ZIAH TURVEY MRN: [id number] Date of Birth: 08-10-1946 Today's Date: 09/12/2018 Time: SLP Start Time (ACUTE ONLY): 1300 -SLP Stop Time (ACUTE ONLY): 1337 SLP Time Calculation (min) (ACUTE ONLY): 37 min Past Medical History: Past Medical History: Diagnosis Date . Arthritis  . Hiatal hernia  . History of chicken pox  . Hyperlipidemia  . Pacemaker  Past Surgical History: Past Surgical History: Procedure Laterality Date . ANKLE SURGERY   . PACEMAKER IMPLANT N/A 04/18/2018  Procedure: PACEMAKER IMPLANT;  Surgeon: Evans Lance, MD;  Location: [facility];  Service: Cardiovascular;  Laterality: N/A; HPI: SHANIGUA GIBB is a 72 y.o. 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  During her hospitalization she underwent left axillary lymph node biopsy.  She was discharged home in a stable condition, but apparently she has been developing worsening weakness, generalized, worse with exertion, no improving factors, associated with malaise, chills and poor appetite.  Over the last 24 hours her symptoms have been more severe to the point where she has difficulty standing. She has persistent left upper extremity edema which has been painful, sharp in nature up to 10 out of 10 intensity.  Her weakness has led to dyspnea which has been severe in intensity, and triggered her emergency room visit today.Pt was made NPO yesterday due to concerns  for aspiration.   Today pt is alert, has congested weak cough concerrning for airway protection.  Subjective: The patient was seen sitting upright in bed with her daughter at the bedside.  Assessment / Plan / Recommendation CHL IP CLINICAL IMPRESSIONS 09/12/2018 Clinical Impression Pt presents with moderately severe oropharyngeal dysphagia with sensorimotor deficits.  Decreased oral  propulsion and weak pharyngeal motility results in significant pharyngeal more than oral residuals.  Pt does NOT sense residuals and is unable to "hock" to expectorate to clear them.  Chin tuck, head turn postures did NOT decrease residuals.  Pt did aspirate with thin liquids posterior trachea with delayed cough response.  Although she did not aspirate with thicker consistencies, increased residuals present which if aspiration post-swallow are more difficult for pulmonary clearance.  Pt is grossly weak at this time - uncertain of her baseline swallow function.  Pt is a high aspiration/malnutrition risk due to level of dysphagia and weak nonproductive cough.  If she is to consume po intake= it should be with accepted risks and mitigation strategies.  Question if pt's swallow with improve with improved medical status. SLP Visit Diagnosis Dysphagia, oropharyngeal phase (R13.12) Attention and concentration deficit following -- Frontal lobe and executive function deficit following -- Impact on safety and function Severe aspiration risk;Risk for inadequate nutrition/hydration   CHL IP TREATMENT RECOMMENDATION 09/11/2018 Treatment Recommendations Therapy as outlined in treatment plan below   Prognosis 09/12/2018 Prognosis for Safe Diet Advancement Fair Barriers to Reach Goals Severity of deficits;Cognitive deficits;Other (Comment) Barriers/Prognosis Comment -- CHL IP DIET RECOMMENDATION 09/12/2018 SLP Diet Recommendations (No Data) Liquid Administration via Cup Medication Administration Crushed with puree Compensations Slow rate;Small sips/bites;Follow solids with liquid Postural Changes Remain semi-upright after after feeds/meals (Comment);Seated upright at 90 degrees   CHL IP OTHER RECOMMENDATIONS 09/12/2018 Recommended Consults -- Oral Care Recommendations Oral care before and after PO Other Recommendations Have oral suction available   CHL IP FOLLOW UP RECOMMENDATIONS 09/12/2018 Follow up Recommendations (No Data)   CHL IP  FREQUENCY AND DURATION 09/12/2018 Speech Therapy Frequency (ACUTE ONLY) min 2x/week Treatment Duration 1 week      CHL IP ORAL PHASE 09/12/2018 Oral Phase Impaired Oral - Pudding Teaspoon -- Oral - Pudding Cup -- Oral - Honey Teaspoon -- Oral - Honey Cup -- Oral - Nectar Teaspoon Weak lingual manipulation;Reduced posterior propulsion;Lingual/palatal residue Oral - Nectar Cup Weak lingual manipulation;Reduced posterior propulsion;Lingual/palatal residue Oral - Nectar Straw Weak lingual manipulation;Reduced posterior propulsion;Lingual/palatal residue Oral - Thin Teaspoon Weak lingual manipulation;Reduced posterior propulsion Oral - Thin Cup Weak lingual manipulation;Reduced posterior propulsion Oral - Thin Straw Reduced posterior propulsion Oral - Puree Weak lingual manipulation;Reduced posterior propulsion Oral - Mech Soft Weak lingual manipulation;Reduced posterior propulsion Oral - Regular -- Oral - Multi-Consistency -- Oral - Pill -- Oral Phase - Comment --  CHL IP PHARYNGEAL PHASE 09/12/2018 Pharyngeal Phase Impaired Pharyngeal- Pudding Teaspoon -- Pharyngeal -- Pharyngeal- Pudding Cup -- Pharyngeal -- Pharyngeal- Honey Teaspoon -- Pharyngeal -- Pharyngeal- Honey Cup -- Pharyngeal -- Pharyngeal- Nectar Teaspoon Reduced pharyngeal peristalsis;Reduced epiglottic inversion;Reduced tongue base retraction;Pharyngeal residue - valleculae;Pharyngeal residue - pyriform Pharyngeal -- Pharyngeal- Nectar Cup Reduced pharyngeal peristalsis;Reduced epiglottic inversion;Reduced laryngeal elevation;Reduced airway/laryngeal closure;Reduced tongue base retraction;Pharyngeal residue - valleculae;Pharyngeal residue - pyriform;Penetration/Aspiration during swallow Pharyngeal Material enters airway, remains ABOVE vocal cords and not ejected out Pharyngeal- Nectar Straw Reduced pharyngeal peristalsis;Reduced epiglottic inversion;Reduced anterior laryngeal mobility;Reduced airway/laryngeal closure;Reduced tongue base retraction;Reduced  laryngeal elevation;Pharyngeal residue - valleculae;Pharyngeal residue - pyriform Pharyngeal -- Pharyngeal- Thin Teaspoon Reduced pharyngeal peristalsis;Reduced  epiglottic inversion;Reduced laryngeal elevation;Reduced airway/laryngeal closure;Reduced tongue base retraction;Pharyngeal residue - valleculae;Pharyngeal residue - pyriform Pharyngeal -- Pharyngeal- Thin Cup Reduced pharyngeal peristalsis;Reduced epiglottic inversion;Reduced laryngeal elevation;Reduced airway/laryngeal closure;Reduced tongue base retraction;Pharyngeal residue - valleculae;Pharyngeal residue - pyriform;Penetration/Aspiration during swallow Pharyngeal Material enters airway, passes BELOW cords without attempt by patient to eject out (silent aspiration) Pharyngeal- Thin Straw Reduced pharyngeal peristalsis;Reduced epiglottic inversion;Reduced laryngeal elevation;Reduced airway/laryngeal closure;Reduced tongue base retraction;Pharyngeal residue - valleculae;Pharyngeal residue - pyriform Pharyngeal Material enters airway, passes BELOW cords and not ejected out despite cough attempt by patient Pharyngeal- Puree Reduced epiglottic inversion;Reduced pharyngeal peristalsis;Reduced tongue base retraction;Reduced airway/laryngeal closure;Reduced laryngeal elevation;Pharyngeal residue - valleculae;Pharyngeal residue - pyriform Pharyngeal -- Pharyngeal- Mechanical Soft Reduced pharyngeal peristalsis;Reduced epiglottic inversion;Reduced tongue base retraction;Pharyngeal residue - valleculae;Reduced airway/laryngeal closure Pharyngeal -- Pharyngeal- Regular -- Pharyngeal -- Pharyngeal- Multi-consistency -- Pharyngeal -- Pharyngeal- Pill -- Pharyngeal -- Pharyngeal Comment --  CHL IP CERVICAL ESOPHAGEAL PHASE 09/12/2018 Cervical Esophageal Phase Impaired Pudding Teaspoon -- Pudding Cup -- Honey Teaspoon -- Honey Cup -- Nectar Teaspoon -- Nectar Cup -- Nectar Straw -- Thin Teaspoon -- Thin Cup -- Thin Straw -- Puree -- Mechanical Soft -- Regular --  Multi-consistency -- Pill -- Cervical Esophageal Comment upon esophageal sweep, pt appeared clear Luanna Salk, MS [facility] SLP Acute Rehab Services Pager [phone] Office [phone] Macario Golds 09/12/2018, 5:29 PM              Scheduled Meds: . acyclovir  400 mg Oral BID  . allopurinol  300 mg Oral Daily  . cholecalciferol  2,000 Units Oral Daily  . feeding supplement (ENSURE ENLIVE)  237 mL Oral BID BM  . folic acid  1 mg Oral Daily  . mouth rinse  15 mL Mouth Rinse BID  . mupirocin ointment   Topical BID  . polyethylene glycol  17 g Oral Daily  . senna-docusate  1 tablet Oral BID   Continuous Infusions: . sodium chloride Stopped (09/12/18 1805)  . sodium chloride    . ampicillin-sulbactam (UNASYN) IV 1.5 g (09/13/18 1724)    LOS: 6 days   Kerney Elbe, DO [facility] PAGER is on [place]  If 7PM-7AM, please contact night-coverage [email or web address] Password [facility] 09/13/2018, 5:39 PM

## 2018-09-13 NOTE — Progress Notes (Signed)
Speech Language Pathology Treatment: Dysphagia  Patient Details Name: Patricia Murillo MRN: [id number] DOB: 08-29-1946 Today's Date: 09/13/2018 Time: 4166-0630 SLP Time Calculation (min) (ACUTE ONLY): 21 min  Assessment / Plan / Recommendation Clinical Impression  Pt currently lethargic and not adequately aware for po - per daughter she received pain medication recently.  SLP educated daughter to education that feeding tubes provide nutrition but do not prevent aspiration.  Advised when tube placed - pt will not consume po diet as she has significant dysphagia at baseline and tube through pharynx/cervical esophagus will worsen epiglottic deflection/dysphagia.  After tube placed, recommend aggressive oral care and pt consumption of single ice chips/tsps of water only to decrease disuse muscle atrophy and help with oral hygiene.  Reviewed need for pt to remain with HOB elevated with TF.    Suspect pt may benefit from longer term alternative means of nutrition *PEG tube* given ongoing severe dysphagia, family report of pt with cough, weakness for several weeks prior to admit and chemo planned that will decrease functional reserve. Informed daughter present that this option may be mentioned by an MD in the near future.     Will follow up later in the week to assess if pt ready for a repeat MBS.  MBS will need to be repeated due to pt's sensory motor deficits and poor clearance of pharynx.  Pt would benefit from small bore feeding tube removed for MBS to decrease obstructive dysphagia risk.       HPI HPI: Patricia Murillo is a 72 y.o. 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  During her hospitalization she underwent left axillary lymph node biopsy.  She was discharged home in a stable condition, but apparently she has been developing worsening weakness,  generalized, worse with exertion, no improving factors, associated with malaise, chills and poor appetite.  Over the last 24 hours her symptoms have been more severe to the point where she has difficulty standing. She has persistent left upper extremity edema which has been painful, sharp in nature up to 10 out of 10 intensity.  Her weakness has led to dyspnea which has been severe in intensity, and triggered her emergency room visit today.Pt was made NPO yesterday due to concerns for aspiration.   Today pt is alert, has congested weak cough concerrning for airway protection.   Pt underwent MBS yesterday and was placed on full liquid diet with precautions. RN reports pt continues to cough with po intake.  Per note from MD, now plan is for pt to have small bore feeding tube placed for nutritional support.  Pt currently asleep and not adequately alert enough for po intake.        SLP Plan  Continue with current plan of care       Recommendations  Diet recommendations: Thin liquid(full liquids only until tube placed, then npo x ice chips, tsps water after oral care as tolerated)                Oral Care Recommendations: Oral care QID Follow up Recommendations: (tbd) SLP Visit Diagnosis: Dysphagia, oropharyngeal phase (R13.12) Plan: Continue with current plan of care       Meyer, [facility] SLP [place] Pager [phone] Office [phone]  Macario Golds 09/13/2018, 4:02  PM

## 2018-09-13 NOTE — Progress Notes (Signed)
Physical Therapy Treatment Patient Details Name: Patricia Murillo MRN: [id number] DOB: 10/25/1945 Today's Date: 09/13/2018    History of Present Illness 72 y.o. 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  During her hospitalization she underwent left axillary lymph node biopsy. Pt admitted with LUE edema, weakness, dyspnea. 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     PT Comments    Pt assisted back to bed via maximove due to significant weakness throughout.  Pt fatigued after OT session, back to bed and rolling for pericare and felt unable to perform LE exercises.  Continue to recommend SNF at this time.   Follow Up Recommendations  SNF     Equipment Recommendations  Rolling walker with 5" wheels;3in1 (PT)    Recommendations for Other Services       Precautions / Restrictions Precautions Precautions: Fall Restrictions Weight Bearing Restrictions: No    Mobility  Bed Mobility Overal bed mobility: Needs Assistance Bed Mobility: Rolling Rolling: +2 for physical assistance;Max assist;Mod assist         General bed mobility comments: mod assist to left however more assist to roll right due to decreased use of L UE, able to assist more with encouragement however needed continued prompting to attempt self assist  Transfers                 General transfer comment: pt in recliner with lift pad under her, pt requesting assist back to bed, extremely weak so utilized maximove for assist back to bed  Ambulation/Gait                 Stairs             Wheelchair Mobility    Modified Rankin (Stroke Patients Only)       Balance                                            Cognition Arousal/Alertness: Awake/alert Behavior During Therapy: WFL for tasks assessed/performed                                    General Comments: decreased attention to task. Cues to sustain attention      Exercises Other Exercises Other Exercises: pt performed 10 ankle pumps, pt felt unable to perform any further LE exercises due to fatigue (OT prior and then back to bed)    General Comments General comments (skin integrity, edema, etc.): assisted PT with maximove back to bed. During session, max A +2 safety to scoot backwards on chair for better back support      Pertinent Vitals/Pain Pain Assessment: Faces Faces Pain Scale: Hurts even more Pain Location: back Pain Descriptors / Indicators: Aching Pain Intervention(s): Repositioned    Home Living                      Prior Function            PT Goals (current goals can now be found in the care plan section) Progress towards PT goals: Progressing toward goals    Frequency    Min 2X/week      PT Plan Current plan  remains appropriate    Co-evaluation              AM-PAC PT "6 Clicks" Mobility   Outcome Measure  Help needed turning from your back to your side while in a flat bed without using bedrails?: Total Help needed moving from lying on your back to sitting on the side of a flat bed without using bedrails?: Total Help needed moving to and from a bed to a chair (including a wheelchair)?: Total Help needed standing up from a chair using your arms (e.g., wheelchair or bedside chair)?: Total Help needed to walk in hospital room?: Total Help needed climbing 3-5 steps with a railing? : Total 6 Click Score: 6    End of Session   Activity Tolerance: Patient limited by fatigue Patient left: in bed;with call bell/phone within reach Nurse Communication: Need for lift equipment PT Visit Diagnosis: Other abnormalities of gait and mobility (R26.89);Muscle weakness (generalized) (M62.81)     Time: 9407-6808 PT Time Calculation (min) (ACUTE ONLY): 23 min  Charges:  $Therapeutic  Activity: 8-22 mins                    Carmelia Bake, PT, DPT Acute Rehabilitation Services Office: [phone] Pager: [phone]  Trena Platt 09/13/2018, 1:24 PM

## 2018-09-13 NOTE — Progress Notes (Addendum)
Patricia Murillo   DOB:[birth year]   HW#:[id number]   VEL#:[id number]  Hem/Onc follow up note   Subjective: Pt failed swallow evaluation, and has been treated for aspiration pneumonia. She is on full liquid diet and will have a Cortrak tube placed for feeding.  She appears to be much more alert when I saw her in the late afternoon, she was sitting in the bed, plan to have dinner.  She denies significant pain, her left arm swelling has improved, afebrile, no bleeding.   Objective:  Vitals:   09/13/18 0631 09/13/18 1359  BP: (!) 128/54 101/62  Pulse: 94 94  Resp: 17 16  Temp: 97.8 F (36.6 C) 97.8 F (36.6 C)  SpO2: 100% 98%    Body mass index is 30.85 kg/m.  Intake/Output Summary (Last 24 hours) at 09/13/2018 2008 Last data filed at 09/13/2018 1822 Gross per 24 hour  Intake 991.72 ml  Output 1250 ml  Net -258.28 ml     Sclerae unicteric  Oropharynx clear  (+) bulky adenopathy at left axilla and left upper front chest   Lungs clear -- scatter wheezing and rhonchi through both lungs   Heart regular rate and rhythm  Abdomen benign, soft   MSK no focal spinal tenderness, (+) severe edema of the left upper arm and leg, and bilateral lower extremity  Neuro: alert, and follow commands. Pupil right bigger than left, both reactive, mild (+) ptosis of left eye. Her left upper extremity strength is significantly reduced. She moves all extremities    CBG (last 3)  No results for input(s): GLUCAP in the last 72 hours.   Labs:  Lab Results  Component Value Date   WBC 14.0 (H) 09/13/2018   HGB 7.7 (L) 09/13/2018   HCT 25.0 (L) 09/13/2018   MCV 93.3 09/13/2018   PLT 85 (L) 09/13/2018   NEUTROABS 10.1 (H) 09/12/2018   CMP Latest Ref Rng & Units 09/13/2018 09/12/2018 09/11/2018  Glucose 70 - 99 mg/dL 135(H) 115(H) 102(H)  BUN 8 - 23 mg/dL 100(H) 91(H) 66(H)  Creatinine 0.44 - 1.00 mg/dL 0.82 0.88 1.02(H)  Sodium 135 - 145 mmol/L 140 139 137  Potassium 3.5 - 5.1 mmol/L 4.7 5.0 4.9   Chloride 98 - 111 mmol/L 110 108 105  CO2 22 - 32 mmol/L 21(L) 22 22  Calcium 8.9 - 10.3 mg/dL 9.7 10.3 11.1(H)  Total Protein 6.5 - 8.1 g/dL 4.0(L) 4.0(L) 4.3(L)  Total Bilirubin 0.3 - 1.2 mg/dL 1.3(H) 1.4(H) 2.1(H)  Alkaline Phos 38 - 126 U/L 59 60 68  AST 15 - 41 U/L 26 17 25   ALT 0 - 44 U/L 15 12 16      Urine Studies No results for input(s): UHGB, CRYS in the last 72 hours.  Invalid input(s): UACOL, UAPR, USPG, UPH, UTP, UGL, UKET, UBIL, UNIT, UROB, [place], UEPI, UWBC, [place], [place], [place], [place], [place]  Basic Metabolic Panel: Recent Labs  Lab 09/09/18 0428 09/10/18 0500 09/11/18 0430 09/12/18 0615 09/13/18 1133  NA 135 137 137 139 140  K 4.3 4.5 4.9 5.0 4.7  CL 103 103 105 108 110  CO2 21* 23 22 22  21*  GLUCOSE 155* 101* 102* 115* 135*  BUN 32* 47* 66* 91* 100*  CREATININE 0.74 0.94 1.02* 0.88 0.82  CALCIUM 11.9* 12.0* 11.1* 10.3 9.7  MG 1.9 2.0 2.1 2.6* 2.7*  PHOS 3.2 3.3 4.5 5.9* 5.7*   GFR Estimated Creatinine Clearance: 69.8 mL/min (by C-G formula based on SCr of 0.82 mg/dL). Liver Function Tests:  Recent Labs  Lab 09/09/18 0428 09/10/18 0500 09/11/18 0430 09/12/18 0615 09/13/18 1133  AST 28 24 25 17 26   ALT 13 15 16 12 15   ALKPHOS 66 73 68 60 59  BILITOT 3.8* 2.4* 2.1* 1.4* 1.3*  PROT 4.3* 4.3* 4.3* 4.0* 4.0*  ALBUMIN 2.3* 2.4* 2.3* 2.3* 2.5*   No results for input(s): LIPASE, AMYLASE in the last 168 hours. No results for input(s): AMMONIA in the last 168 hours. Coagulation profile Recent Labs  Lab 09/07/18 0807  INR 1.23    CBC: Recent Labs  Lab 09/07/18 0807  09/09/18 0428 09/10/18 0500 09/11/18 0430 09/12/18 0615 09/13/18 1133  WBC 14.7*   < > 9.6 12.0* 12.8* 10.5 14.0*  NEUTROABS 11.8*  --  8.3* 11.0* 11.8* 10.1*  --   HGB 8.2*   < > 8.5* 8.9* 8.5* 7.3* 7.7*  HCT 27.5*   < > 28.8* 30.1* 29.0* 25.1* 25.0*  MCV 92.6   < > 94.1 93.2 94.5 93.7 93.3  PLT 260   < > 149* 120* 91* 66* 85*   < > = values in this interval not displayed.    Cardiac Enzymes: No results for input(s): CKTOTAL, CKMB, CKMBINDEX, TROPONINI in the last 168 hours. BNP: Invalid input(s): POCBNP CBG: No results for input(s): GLUCAP in the last 168 hours. D-Dimer No results for input(s): DDIMER in the last 72 hours. Hgb A1c No results for input(s): HGBA1C in the last 72 hours. Lipid Profile No results for input(s): CHOL, HDL, LDLCALC, TRIG, CHOLHDL, LDLDIRECT in the last 72 hours. Thyroid function studies No results for input(s): TSH, T4TOTAL, T3FREE, THYROIDAB in the last 72 hours.  Invalid input(s): FREET3 Anemia work up No results for input(s): VITAMINB12, FOLATE, FERRITIN, TIBC, IRON, RETICCTPCT in the last 72 hours. Microbiology Recent Results (from the past 240 hour(s))  Culture, blood (Routine x 2)     Status: None   Collection Time: 09/07/18  8:07 AM  Result Value Ref Range Status   Specimen Description   Final    BLOOD LEFT HAND Performed at [facility] [address] [place], [place] [address]    Special Requests   Final    Blood Culture adequate volume BOTTLES DRAWN AEROBIC AND ANAEROBIC   Culture   Final    NO GROWTH 5 DAYS Performed at [facility], [place] [address] [place], [place] [address]    Report Status 09/12/2018 FINAL  Final  Culture, blood (Routine x 2)     Status: None   Collection Time: 09/07/18  8:18 AM  Result Value Ref Range Status   Specimen Description   Final    BLOOD RIGHT ANTECUBITAL Performed at [facility] [address] [place], [place] [address]    Special Requests   Final    BOTTLES DRAWN AEROBIC AND ANAEROBIC Blood Culture adequate volume Performed at [facility] [address], [place], [place] [address]    Culture   Final    NO GROWTH 5 DAYS Performed at [facility], [place] [address], [place], [place] [address]    Report Status 09/12/2018 FINAL  Final  Urine culture     Status: None   Collection Time: 09/07/18   8:22 AM  Result Value Ref Range Status   Specimen Description   Final    URINE, CLEAN CATCH Performed at [facility], [place] [address] [place], [place] [address]    Special Requests   Final  NONE Performed at [facility], [place] [address] [place], [place] [address]    Culture   Final    NO GROWTH Performed at [facility], [place] [address], [place], [place] [address]    Report Status 09/08/2018 FINAL  Final      Studies:  Dg Swallowing Func-speech Pathology  Result Date: 09/12/2018 Objective Swallowing Evaluation: Type of Study: MBS-Modified Barium Swallow Study  Patient Details Name: MIYA LUVIANO MRN: [id number] Date of Birth: 1946-04-11 Today's Date: 09/12/2018 Time: SLP Start Time (ACUTE ONLY): 1300 -SLP Stop Time (ACUTE ONLY): 1337 SLP Time Calculation (min) (ACUTE ONLY): 37 min Past Medical History: Past Medical History: Diagnosis Date . Arthritis  . Hiatal hernia  . History of chicken pox  . Hyperlipidemia  . Pacemaker  Past Surgical History: Past Surgical History: Procedure Laterality Date . ANKLE SURGERY   . PACEMAKER IMPLANT N/A 04/18/2018  Procedure: PACEMAKER IMPLANT;  Surgeon: Evans Lance, MD;  Location: [facility];  Service: Cardiovascular;  Laterality: N/A; HPI: KAMERA DUBAS is a 72 y.o. 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  During her hospitalization she underwent left axillary lymph node biopsy.  She was discharged home in a stable condition, but apparently she has been developing worsening weakness, generalized, worse with exertion, no improving factors, associated with malaise, chills and poor appetite.  Over the last 24 hours her symptoms have been more severe to the point where she has difficulty standing. She has persistent left upper extremity edema which has been  painful, sharp in nature up to 10 out of 10 intensity.  Her weakness has led to dyspnea which has been severe in intensity, and triggered her emergency room visit today.Pt was made NPO yesterday due to concerns for aspiration.   Today pt is alert, has congested weak cough concerrning for airway protection.  Subjective: The patient was seen sitting upright in bed with her daughter at the bedside.  Assessment / Plan / Recommendation CHL IP CLINICAL IMPRESSIONS 09/12/2018 Clinical Impression Pt presents with moderately severe oropharyngeal dysphagia with sensorimotor deficits.  Decreased oral propulsion and weak pharyngeal motility results in significant pharyngeal more than oral residuals.  Pt does NOT sense residuals and is unable to "hock" to expectorate to clear them.  Chin tuck, head turn postures did NOT decrease residuals.  Pt did aspirate with thin liquids posterior trachea with delayed cough response.  Although she did not aspirate with thicker consistencies, increased residuals present which if aspiration post-swallow are more difficult for pulmonary clearance.  Pt is grossly weak at this time - uncertain of her baseline swallow function.  Pt is a high aspiration/malnutrition risk due to level of dysphagia and weak nonproductive cough.  If she is to consume po intake= it should be with accepted risks and mitigation strategies.  Question if pt's swallow with improve with improved medical status. SLP Visit Diagnosis Dysphagia, oropharyngeal phase (R13.12) Attention and concentration deficit following -- Frontal lobe and executive function deficit following -- Impact on safety and function Severe aspiration risk;Risk for inadequate nutrition/hydration   CHL IP TREATMENT RECOMMENDATION 09/11/2018 Treatment Recommendations Therapy as outlined in treatment plan below   Prognosis 09/12/2018 Prognosis for Safe Diet Advancement Fair Barriers to Reach Goals Severity of deficits;Cognitive deficits;Other (Comment)  Barriers/Prognosis Comment -- CHL IP DIET RECOMMENDATION 09/12/2018 SLP Diet Recommendations (No Data) Liquid Administration via  Cup Medication Administration Crushed with puree Compensations Slow rate;Small sips/bites;Follow solids with liquid Postural Changes Remain semi-upright after after feeds/meals (Comment);Seated upright at 90 degrees   CHL IP OTHER RECOMMENDATIONS 09/12/2018 Recommended Consults -- Oral Care Recommendations Oral care before and after PO Other Recommendations Have oral suction available   CHL IP FOLLOW UP RECOMMENDATIONS 09/12/2018 Follow up Recommendations (No Data)   CHL IP FREQUENCY AND DURATION 09/12/2018 Speech Therapy Frequency (ACUTE ONLY) min 2x/week Treatment Duration 1 week      CHL IP ORAL PHASE 09/12/2018 Oral Phase Impaired Oral - Pudding Teaspoon -- Oral - Pudding Cup -- Oral - Honey Teaspoon -- Oral - Honey Cup -- Oral - Nectar Teaspoon Weak lingual manipulation;Reduced posterior propulsion;Lingual/palatal residue Oral - Nectar Cup Weak lingual manipulation;Reduced posterior propulsion;Lingual/palatal residue Oral - Nectar Straw Weak lingual manipulation;Reduced posterior propulsion;Lingual/palatal residue Oral - Thin Teaspoon Weak lingual manipulation;Reduced posterior propulsion Oral - Thin Cup Weak lingual manipulation;Reduced posterior propulsion Oral - Thin Straw Reduced posterior propulsion Oral - Puree Weak lingual manipulation;Reduced posterior propulsion Oral - Mech Soft Weak lingual manipulation;Reduced posterior propulsion Oral - Regular -- Oral - Multi-Consistency -- Oral - Pill -- Oral Phase - Comment --  CHL IP PHARYNGEAL PHASE 09/12/2018 Pharyngeal Phase Impaired Pharyngeal- Pudding Teaspoon -- Pharyngeal -- Pharyngeal- Pudding Cup -- Pharyngeal -- Pharyngeal- Honey Teaspoon -- Pharyngeal -- Pharyngeal- Honey Cup -- Pharyngeal -- Pharyngeal- Nectar Teaspoon Reduced pharyngeal peristalsis;Reduced epiglottic inversion;Reduced tongue base retraction;Pharyngeal residue -  valleculae;Pharyngeal residue - pyriform Pharyngeal -- Pharyngeal- Nectar Cup Reduced pharyngeal peristalsis;Reduced epiglottic inversion;Reduced laryngeal elevation;Reduced airway/laryngeal closure;Reduced tongue base retraction;Pharyngeal residue - valleculae;Pharyngeal residue - pyriform;Penetration/Aspiration during swallow Pharyngeal Material enters airway, remains ABOVE vocal cords and not ejected out Pharyngeal- Nectar Straw Reduced pharyngeal peristalsis;Reduced epiglottic inversion;Reduced anterior laryngeal mobility;Reduced airway/laryngeal closure;Reduced tongue base retraction;Reduced laryngeal elevation;Pharyngeal residue - valleculae;Pharyngeal residue - pyriform Pharyngeal -- Pharyngeal- Thin Teaspoon Reduced pharyngeal peristalsis;Reduced epiglottic inversion;Reduced laryngeal elevation;Reduced airway/laryngeal closure;Reduced tongue base retraction;Pharyngeal residue - valleculae;Pharyngeal residue - pyriform Pharyngeal -- Pharyngeal- Thin Cup Reduced pharyngeal peristalsis;Reduced epiglottic inversion;Reduced laryngeal elevation;Reduced airway/laryngeal closure;Reduced tongue base retraction;Pharyngeal residue - valleculae;Pharyngeal residue - pyriform;Penetration/Aspiration during swallow Pharyngeal Material enters airway, passes BELOW cords without attempt by patient to eject out (silent aspiration) Pharyngeal- Thin Straw Reduced pharyngeal peristalsis;Reduced epiglottic inversion;Reduced laryngeal elevation;Reduced airway/laryngeal closure;Reduced tongue base retraction;Pharyngeal residue - valleculae;Pharyngeal residue - pyriform Pharyngeal Material enters airway, passes BELOW cords and not ejected out despite cough attempt by patient Pharyngeal- Puree Reduced epiglottic inversion;Reduced pharyngeal peristalsis;Reduced tongue base retraction;Reduced airway/laryngeal closure;Reduced laryngeal elevation;Pharyngeal residue - valleculae;Pharyngeal residue - pyriform Pharyngeal -- Pharyngeal-  Mechanical Soft Reduced pharyngeal peristalsis;Reduced epiglottic inversion;Reduced tongue base retraction;Pharyngeal residue - valleculae;Reduced airway/laryngeal closure Pharyngeal -- Pharyngeal- Regular -- Pharyngeal -- Pharyngeal- Multi-consistency -- Pharyngeal -- Pharyngeal- Pill -- Pharyngeal -- Pharyngeal Comment --  CHL IP CERVICAL ESOPHAGEAL PHASE 09/12/2018 Cervical Esophageal Phase Impaired Pudding Teaspoon -- Pudding Cup -- Honey Teaspoon -- Honey Cup -- Nectar Teaspoon -- Nectar Cup -- Nectar Straw -- Thin Teaspoon -- Thin Cup -- Thin Straw -- Puree -- Mechanical Soft -- Regular -- Multi-consistency -- Pill -- Cervical Esophageal Comment upon esophageal sweep, pt appeared clear Luanna Salk, MS [facility] SLP Acute Rehab Services Pager [phone] Office [phone] Macario Golds 09/12/2018, 5:29 PM               Assessment: 72 y.o. with newly diagnosed high-grade B-cell lymphoma, recently discharged from hospital after biopsy, presented to the emergency room with progressive weakness, dyspnea, chills and fever, anorexia, and extremity edema.  1. Sepsis, with fever, chill, probable aspiration pneumonia, improving  2.  Acute hypoxic respite failure, secondary to lymphoma in lungs, versus infections, improving  3.  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 Hypercalcemia secondary to malignancy, s/p zometa 12/5, resolved now  7.  Hyperbilirubinemia, secondary to hemolysis, imrproving  8. Severe arm and leg edema, secondary to severe adenopathy and hypoalbuminemia, improving  9. Deconditioning  10 left ptosis and dilated right pupil  11.  thrombocytopenia, secondary to lymphoma and chemotherapy, improving  12. Aspiration   Plan:  -Hg 7.7 this  morning, from 7.3 yesterday, received 1u RBC yesterday. Will get one more unit blood today  -mental status improved when I saw her, although she was still lethargic during the day per notes. She can not have MRI due to pacemaker. If her has recurrent encephalopathy, I would consider LP to rule out CNS lymphoma. -she will have a Cortrack feeding tube placed due to her aspiration, re-evaluate her swallow function when her mental status improves, to see if she needs long-term feeding tube. -She has completed 5 days of prednisone 60 mg as part of her chemo, due to her persistent hemolytic anemia, I will continue prednisone, reduce dose to 40 mg tomorrow, plan to taper it off in a few weeks  -I updated her son at bed side, plan discussed with Dr. Bettey Costa  09/13/2018

## 2018-09-13 NOTE — Significant Event (Signed)
Rapid Response Event Note  Overview: Time Called: 2157 Arrival Time: 2205 Event Type: Cardiac(Chest pain)  Initial Focused Assessment: Rapid response called for patient having 4/10 chest pain. Upon my assessment patient motions towards pain location and pain is at the diaphragm / upper abdomen. Patient initially felt nauseated and then belched. Pain was a 2/10 in her diaphragm / upper abdomen post belch. Per RN, patient has been having small, soft bowel movements since admission. Bowel sounds audible. Chest auscultated and fine crackles and crackles heard in all lobes. Vital signs documented in flow sheet. Blood currently transfusing, patient has received previous blood transfusions without complications. Patient denies lower back pain, shortness of breath, or flank pain.  Interventions: NP notified of patient's pain location and how pain decreased after belching. NP made aware of patients bowel habits since admission. NP ordered abdominal xray to be completed at this time.   Plan of Care (if not transferred): Patient educated to notify her primary RN if pain worsens, nausea returns, or pain persists.  Abdominal xray being completed now.  RN to contact rapid response for further needs.   Event Summary:  Patricia Murillo

## 2018-09-13 NOTE — Progress Notes (Signed)
Occupational Therapy Treatment Patient Details Name: Patricia Murillo MRN: [id number] DOB: 1946-03-05 Today's Date: 09/13/2018    History of present illness 72 y.o. 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  During her hospitalization she underwent left axillary lymph node biopsy. Pt admitted with LUE edema, weakness, dyspnea. 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    OT comments  Performed AAROM, grooming, repositioned for edema control and scooting back in chair for better positioning.  Assisted PT with maximove transfer back to bed  Follow Up Recommendations  SNF    Equipment Recommendations  None recommended by OT    Recommendations for Other Services      Precautions / Restrictions Precautions Precautions: Fall Restrictions Weight Bearing Restrictions: No       Mobility Bed Mobility                  Transfers                      Balance                                           ADL either performed or assessed with clinical judgement   ADL       Grooming: Brushing hair;Moderate assistance;Sitting                                       Vision       Perception     Praxis      Cognition Arousal/Alertness: Awake/alert Behavior During Therapy: WFLs                                   General Comments: decreased attention to task. Cues to sustain attention        Exercises Other Exercises Other Exercises: AAROM throughout LUE.  Encouraged making a partial fist at beginning and end of session. Only able to sustain attention to about 5 reps with cues   Shoulder Instructions       General Comments assisted PT with maximove back to bed. During session, max A +2 safety to scoot backwards on chair for better back support     Pertinent Vitals/ Pain       Pain Assessment: Faces Faces Pain Scale: Hurts even more Pain Location: back Pain Descriptors / Indicators: Aching Pain Intervention(s): Limited activity within patient's tolerance;Monitored during session;Repositioned  Home Living                                          Prior Functioning/Environment              Frequency  Min 2X/week        Progress Toward Goals  OT Goals(current goals can now be found in the care plan section)  Progress towards OT goals: Progressing toward goals     Plan      Co-evaluation  AM-PAC OT "6 Clicks" Daily Activity     Outcome Measure   Help from another person eating meals?: Total Help from another person taking care of personal grooming?: A Lot Help from another person toileting, which includes using toliet, bedpan, or urinal?: Total Help from another person bathing (including washing, rinsing, drying)?: Total Help from another person to put on and taking off regular upper body clothing?: Total Help from another person to put on and taking off regular lower body clothing?: Total 6 Click Score: 7    End of Session    OT Visit Diagnosis: Unsteadiness on feet (R26.81);Muscle weakness (generalized) (M62.81)   Activity Tolerance Patient limited by fatigue   Patient Left in bed;with call bell/phone within reach;with bed alarm set   Nurse Communication          Time: 6184-8592 OT Time Calculation (min): 23 min  Charges: OT General Charges $OT Visit: 1 Visit OT Treatments $Therapeutic Activity: 8-22 mins  Patricia Murillo, OTR/L Acute Rehabilitation Services [phone] [place] pager [phone] office 09/13/2018   Patricia Murillo 09/13/2018, 1:06 PM

## 2018-09-14 ENCOUNTER — Inpatient Hospital Stay (HOSPITAL_COMMUNITY): Payer: Medicare HMO

## 2018-09-14 DIAGNOSIS — T17908D Unspecified foreign body in respiratory tract, part unspecified causing other injury, subsequent encounter: Secondary | ICD-10-CM

## 2018-09-14 DIAGNOSIS — T17908A Unspecified foreign body in respiratory tract, part unspecified causing other injury, initial encounter: Secondary | ICD-10-CM

## 2018-09-14 DIAGNOSIS — E870 Hyperosmolality and hypernatremia: Secondary | ICD-10-CM | POA: Diagnosis not present

## 2018-09-14 LAB — MAGNESIUM: Magnesium: 2.3 mg/dL (ref 1.7–2.4)

## 2018-09-14 LAB — COMPREHENSIVE METABOLIC PANEL
ALT: 11 U/L (ref 0–44)
AST: 20 U/L (ref 15–41)
Albumin: 2.2 g/dL — ABNORMAL LOW (ref 3.5–5.0)
Alkaline Phosphatase: 50 U/L (ref 38–126)
Anion gap: 11 (ref 5–15)
BUN: 74 mg/dL — ABNORMAL HIGH (ref 8–23)
CO2: 21 mmol/L — ABNORMAL LOW (ref 22–32)
Calcium: 8.4 mg/dL — ABNORMAL LOW (ref 8.9–10.3)
Chloride: 116 mmol/L — ABNORMAL HIGH (ref 98–111)
Creatinine, Ser: 0.79 mg/dL (ref 0.44–1.00)
GFR calc Af Amer: >60 mL/min (ref >60)
GFR calc non Af Amer: >60 mL/min (ref >60)
Glucose, Bld: 105 mg/dL — ABNORMAL HIGH (ref 70–99)
Potassium: 3.8 mmol/L (ref 3.5–5.1)
Sodium: 148 mmol/L — ABNORMAL HIGH (ref 135–145)
Total Bilirubin: 0.9 mg/dL (ref 0.3–1.2)
Total Protein: 3.6 g/dL — ABNORMAL LOW (ref 6.5–8.1)

## 2018-09-14 LAB — CBC WITH DIFFERENTIAL/PLATELET
Abs Immature Granulocytes: 0.07 10*3/uL (ref 0.00–0.07)
Basophils Absolute: 0 10*3/uL (ref 0.0–0.1)
Basophils Relative: 0 %
Eosinophils Absolute: 0 10*3/uL (ref 0.0–0.5)
Eosinophils Relative: 0 %
HCT: 22.8 % — ABNORMAL LOW (ref 36.0–46.0)
Hemoglobin: 7.1 g/dL — ABNORMAL LOW (ref 12.0–15.0)
Immature Granulocytes: 1 %
Lymphocytes Relative: 1 %
Lymphs Abs: 0.1 10*3/uL — ABNORMAL LOW (ref 0.7–4.0)
MCH: 29.1 pg (ref 26.0–34.0)
MCHC: 31.1 g/dL (ref 30.0–36.0)
MCV: 93.4 fL (ref 80.0–100.0)
Monocytes Absolute: 0 10*3/uL — ABNORMAL LOW (ref 0.1–1.0)
Monocytes Relative: 1 %
Neutro Abs: 7.9 10*3/uL — ABNORMAL HIGH (ref 1.7–7.7)
Neutrophils Relative %: 97 %
Platelets: 56 10*3/uL — ABNORMAL LOW (ref 150–400)
RBC: 2.44 MIL/uL — ABNORMAL LOW (ref 3.87–5.11)
RDW: 17.8 % — ABNORMAL HIGH (ref 11.5–15.5)
WBC: 8.1 10*3/uL (ref 4.0–10.5)
nRBC: 0 % (ref 0.0–0.2)

## 2018-09-14 LAB — PREPARE RBC (CROSSMATCH)

## 2018-09-14 LAB — HEMOGLOBIN AND HEMATOCRIT, BLOOD
HCT: 21.7 % — ABNORMAL LOW (ref 36.0–46.0)
Hemoglobin: 6.7 g/dL — CL (ref 12.0–15.0)

## 2018-09-14 LAB — URIC ACID: Uric Acid, Serum: 5.8 mg/dL (ref 2.5–7.1)

## 2018-09-14 LAB — PHOSPHORUS: PHOSPHORUS: 4.4 mg/dL (ref 2.5–4.6)

## 2018-09-14 MED ORDER — FUROSEMIDE 10 MG/ML IJ SOLN
20.0000 mg | Freq: Once | INTRAMUSCULAR | Status: AC
  Administered 2018-09-14: 20 mg via INTRAVENOUS
  Filled 2018-09-14: qty 2

## 2018-09-14 MED ORDER — ALBUMIN HUMAN 25 % IV SOLN
25.0000 g | Freq: Four times a day (QID) | INTRAVENOUS | Status: AC
  Administered 2018-09-14 (×2): 12.5 g via INTRAVENOUS
  Administered 2018-09-15 (×2): 25 g via INTRAVENOUS
  Filled 2018-09-14 (×3): qty 100

## 2018-09-14 MED ORDER — PANTOPRAZOLE SODIUM 40 MG IV SOLR
40.0000 mg | Freq: Two times a day (BID) | INTRAVENOUS | Status: DC
  Administered 2018-09-14 – 2018-09-24 (×21): 40 mg via INTRAVENOUS
  Filled 2018-09-14 (×21): qty 40

## 2018-09-14 MED ORDER — SODIUM CHLORIDE 0.9% IV SOLUTION
Freq: Once | INTRAVENOUS | Status: AC
  Administered 2018-09-14: 18:00:00 via INTRAVENOUS

## 2018-09-14 MED ORDER — FUROSEMIDE 10 MG/ML IJ SOLN
INTRAMUSCULAR | Status: AC
  Filled 2018-09-14: qty 2

## 2018-09-14 MED ORDER — DIPHENHYDRAMINE HCL 50 MG/ML IJ SOLN
25.0000 mg | Freq: Once | INTRAMUSCULAR | Status: AC
  Administered 2018-09-14: 25 mg via INTRAVENOUS
  Filled 2018-09-14: qty 1

## 2018-09-14 MED ORDER — ACETAMINOPHEN 325 MG PO TABS
650.0000 mg | ORAL_TABLET | Freq: Once | ORAL | Status: AC
  Administered 2018-09-14: 650 mg via ORAL
  Filled 2018-09-14: qty 2

## 2018-09-14 MED ORDER — DEXTROSE 5 % IV SOLN
INTRAVENOUS | Status: AC
  Administered 2018-09-14: 11:00:00 via INTRAVENOUS

## 2018-09-14 NOTE — Progress Notes (Addendum)
Speech Language Pathology Treatment: Dysphagia  Patient Details Name: Patricia Murillo MRN: [id number] DOB: [birth year] Today's Date: 09/14/2018 Time: 1720-1736 SLP Time Calculation (min) (ACUTE ONLY): 16 min  Assessment / Plan / Recommendation Clinical Impression  Reorder for evaluation received.  Pt with cough and voice marginally stronger and pt was able to conduct "hocking" and expectoration after cough today with max cues for effort. Pt effectively able to remove secretions retained in phayrnx.   Advised pt and her son Patricia Murillo to work on strengthening her "hock" and "strong swallow" tonight to maximize pharyngeal clearance during MBS tomorrow.   Pt observed trying to drink Ensure with HOB lowered to approximately 25% - advised her to sit fully upright for all intake given level of dysphagia. Son Patricia Murillo reports pt has been sitting upright.  SLP reinforced need for repeat MBS due to sensorimotor deficits, strengthening of swallow and reviewed prior MBS with pt/son teach back.    Please note, pt was fully alert during MBS 2 days ago and able to follow directions/commands.  Reviewed recommendation to NOT place nasogastric feeding tube prior to MBS to provide pt with best opportunity.    Advised pt if her swallowing is not improved tomorrow on MBS, consideration for longer term alternative means of nutrition may be mentioned.  Reviewed that feeding tubes give nutrition but do not prevent aspiration.  Per son, Patricia Murillo, pt will not allow nasogastric feeding tube to be placed - will only allow feeding tube into the stomach if needed.    MBS tentatively planned for tomorrow at 1145 to 1230 pm with xray department.    HPI HPI: Patricia Murillo is a 72 y.o. 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  During her hospitalization she underwent left axillary lymph node biopsy.   She was discharged home in a stable condition, but apparently she has been developing worsening weakness, generalized, worse with exertion, no improving factors, associated with malaise, chills and poor appetite.  Over the last 24 hours her symptoms have been more severe to the point where she has difficulty standing. She has persistent left upper extremity edema which has been painful, sharp in nature up to 10 out of 10 intensity.  Her weakness has led to dyspnea which has been severe in intensity, and triggered her emergency room visit today.Pt was made NPO yesterday due to concerns for aspiration.   Today pt is alert, has congested weak cough concerrning for airway protection.   Pt underwent MBS yesterday and was placed on full liquid diet with precautions. RN reports pt continues to cough with po intake.  Per note from MD, now plan is for pt to have small bore feeding tube placed for nutritional support.  Unfortunately pt did not tolerate attempt at insertion of feeding tube.  Repeat swallow evaluation ordered.  SLP follow up to inform family/pt of plan and determine if clinically pt appears improved.         SLP Plan  MBS(next date)       Recommendations  Diet recommendations: Thin liquid;Other(comment)(full liquids) Liquids provided via: Straw;Cup Medication Administration: Crushed with puree Supervision: Full supervision/cueing for compensatory strategies Compensations: Slow rate;Small sips/bites;Other (Comment)(effortful swallow, intermittently cough/expectorate - 'Hock") Postural Changes and/or Swallow Maneuvers: Seated upright 90 degrees;Upright 30-60 min after meal                Oral Care Recommendations:  Oral care QID Follow up Recommendations: (tbd) SLP Visit Diagnosis: Dysphagia, oropharyngeal phase (R13.12) Plan: MBS(next date)       GO                Patricia Murillo 09/14/2018, 5:46 PM Patricia Murillo, [facility] SLP Acute Rehab Services Pager  [phone] Office [phone]

## 2018-09-14 NOTE — Progress Notes (Signed)
BRIGITT MCCLISH   DOB:11-29-1945   IR#:[id number]   [phone]  Hem/Onc follow up note   Subjective: Pt could not tolerate Cortrack insertion. She is more alert today, answers questions appropriately.  Afebrile, no bleeding.  She is still very fatigued, has not been out of bed today.  She tolerated blood transfusion well last night.  Objective:  Vitals:   09/14/18 0524 09/14/18 1432  BP: (!) 143/68 (!) 146/64  Pulse: 86 86  Resp: 20 17  Temp: (!) 97.5 F (36.4 C)   SpO2: 99% 100%    Body mass index is 30.85 kg/m.  Intake/Output Summary (Last 24 hours) at 09/14/2018 1626 Last data filed at 09/14/2018 1330 Gross per 24 hour  Intake 567.85 ml  Output 2200 ml  Net -1632.15 ml     Sclerae unicteric  Oropharynx clear  (+) bulky adenopathy at left axilla and left upper front chest   Lungs clear -- scatter wheezing and rhonchi through both lungs   Heart regular rate and rhythm  Abdomen benign, soft   MSK no focal spinal tenderness, (+) severe edema of the left upper arm and leg, and bilateral lower extremity  Neuro: alert, and follow commands. Pupil right bigger than left, both reactive, mild (+) ptosis of left eye. Her left upper extremity strength is significantly reduced. She moves all extremities    CBG (last 3)  No results for input(s): GLUCAP in the last 72 hours.   Labs:  Lab Results  Component Value Date   WBC 8.1 09/14/2018   HGB 6.7 (LL) 09/14/2018   HCT 21.7 (L) 09/14/2018   MCV 93.4 09/14/2018   PLT 56 (L) 09/14/2018   NEUTROABS 7.9 (H) 09/14/2018   CMP Latest Ref Rng & Units 09/14/2018 09/13/2018 09/12/2018  Glucose 70 - 99 mg/dL 105(H) 135(H) 115(H)  BUN 8 - 23 mg/dL 74(H) 100(H) 91(H)  Creatinine 0.44 - 1.00 mg/dL 0.79 0.82 0.88  Sodium 135 - 145 mmol/L 148(H) 140 139  Potassium 3.5 - 5.1 mmol/L 3.8 4.7 5.0  Chloride 98 - 111 mmol/L 116(H) 110 108  CO2 22 - 32 mmol/L 21(L) 21(L) 22  Calcium 8.9 - 10.3 mg/dL 8.4(L) 9.7 10.3  Total Protein 6.5 - 8.1  g/dL 3.6(L) 4.0(L) 4.0(L)  Total Bilirubin 0.3 - 1.2 mg/dL 0.9 1.3(H) 1.4(H)  Alkaline Phos 38 - 126 U/L 50 59 60  AST 15 - 41 U/L 20 26 17   ALT 0 - 44 U/L 11 15 12      Urine Studies No results for input(s): UHGB, CRYS in the last 72 hours.  Invalid input(s): UACOL, UAPR, USPG, UPH, UTP, UGL, UKET, UBIL, UNIT, UROB, ULEU, UEPI, UWBC, URBC, [place], [place], [place], [place]  Basic Metabolic Panel: Recent Labs  Lab 09/10/18 0500 09/11/18 0430 09/12/18 0615 09/13/18 1133 09/14/18 0449  NA 137 137 139 140 148*  K 4.5 4.9 5.0 4.7 3.8  CL 103 105 108 110 116*  CO2 23 22 22  21* 21*  GLUCOSE 101* 102* 115* 135* 105*  BUN 47* 66* 91* 100* 74*  CREATININE 0.94 1.02* 0.88 0.82 0.79  CALCIUM 12.0* 11.1* 10.3 9.7 8.4*  MG 2.0 2.1 2.6* 2.7* 2.3  PHOS 3.3 4.5 5.9* 5.7* 4.4   GFR Estimated Creatinine Clearance: 71.6 mL/min (by C-G formula based on SCr of 0.79 mg/dL). Liver Function Tests: Recent Labs  Lab 09/10/18 0500 09/11/18 0430 09/12/18 0615 09/13/18 1133 09/14/18 0449  AST 24 25 17 26 20   ALT 15 16 12 15 11   ALKPHOS  73 68 60 59 50  BILITOT 2.4* 2.1* 1.4* 1.3* 0.9  PROT 4.3* 4.3* 4.0* 4.0* 3.6*  ALBUMIN 2.4* 2.3* 2.3* 2.5* 2.2*   No results for input(s): LIPASE, AMYLASE in the last 168 hours. No results for input(s): AMMONIA in the last 168 hours. Coagulation profile No results for input(s): INR, PROTIME in the last 168 hours.  CBC: Recent Labs  Lab 09/09/18 0428 09/10/18 0500 09/11/18 0430 09/12/18 0615 09/13/18 1133 09/14/18 0449 09/14/18 1300  WBC 9.6 12.0* 12.8* 10.5 14.0* 8.1  --   NEUTROABS 8.3* 11.0* 11.8* 10.1*  --  7.9*  --   HGB 8.5* 8.9* 8.5* 7.3* 7.7* 7.1* 6.7*  HCT 28.8* 30.1* 29.0* 25.1* 25.0* 22.8* 21.7*  MCV 94.1 93.2 94.5 93.7 93.3 93.4  --   PLT 149* 120* 91* 66* 85* 56*  --    Cardiac Enzymes: No results for input(s): CKTOTAL, CKMB, CKMBINDEX, TROPONINI in the last 168 hours. BNP: Invalid input(s): POCBNP CBG: No results for input(s): GLUCAP  in the last 168 hours. D-Dimer No results for input(s): DDIMER in the last 72 hours. Hgb A1c No results for input(s): HGBA1C in the last 72 hours. Lipid Profile No results for input(s): CHOL, HDL, LDLCALC, TRIG, CHOLHDL, LDLDIRECT in the last 72 hours. Thyroid function studies No results for input(s): TSH, T4TOTAL, T3FREE, THYROIDAB in the last 72 hours.  Invalid input(s): FREET3 Anemia work up No results for input(s): VITAMINB12, FOLATE, FERRITIN, TIBC, IRON, RETICCTPCT in the last 72 hours. Microbiology Recent Results (from the past 240 hour(s))  Culture, blood (Routine x 2)     Status: None   Collection Time: 09/07/18  8:07 AM  Result Value Ref Range Status   Specimen Description   Final    BLOOD LEFT HAND Performed at [facility] [address], [place], [place] [address]    Special Requests   Final    Blood Culture adequate volume BOTTLES DRAWN AEROBIC AND ANAEROBIC   Culture   Final    NO GROWTH 5 DAYS Performed at [facility], [place] [address], [place], [place] [address]    Report Status 09/12/2018 FINAL  Final  Culture, blood (Routine x 2)     Status: None   Collection Time: 09/07/18  8:18 AM  Result Value Ref Range Status   Specimen Description   Final    BLOOD RIGHT ANTECUBITAL Performed at [facility] [address], [place], [place] [address]    Special Requests   Final    BOTTLES DRAWN AEROBIC AND ANAEROBIC Blood Culture adequate volume Performed at [facility] [address], [place], [place] [address]    Culture   Final    NO GROWTH 5 DAYS Performed at [facility], [place] [address], [place], [place] [address]    Report Status 09/12/2018 FINAL  Final  Urine culture     Status: None   Collection Time: 09/07/18  8:22 AM  Result Value Ref Range Status   Specimen Description   Final    URINE, CLEAN CATCH Performed at [facility], [place] [address], [place], [place]  [address]    Special Requests   Final    NONE Performed at [facility], [place] [address], [place], [place] [address]    Culture   Final    NO GROWTH Performed at [facility], [place] [address], [place],  [address]    Report Status 09/08/2018 FINAL  Final      Studies:  Dg Chest Port 1 View  Result Date: 09/14/2018 CLINICAL DATA:  Shortness of breath. EXAM: PORTABLE CHEST 1 VIEW COMPARISON:  Radiographs 09/11/2018 and 09/09/2018. FINDINGS: 0523 hours. Mild patient rotation to the right. The right arm PICC and right subclavian pacemaker leads appear unchanged. The heart size and mediastinal contours are stable. There is a large hiatal hernia. There are persistent bilateral pleural effusions which appear mildly improved on the right. There is no pneumothorax or confluent airspace opacity. IMPRESSION: No acute findings demonstrated. The right pleural effusion appears slightly smaller. Electronically Signed   By: Richardean Sale M.D.   On: 09/14/2018 09:10   Dg Abd Portable 1v  Result Date: 09/13/2018 CLINICAL DATA:  Constipation EXAM: PORTABLE ABDOMEN - 1 VIEW COMPARISON:  CT 08/31/2018 FINDINGS: Nonobstructed bowel gas pattern. Residual contrast within the colon. Mild to moderate feces retention at the rectum. IMPRESSION: Nonobstructed gas pattern with mild to moderate fecal retention at the rectum Electronically Signed   By: Donavan Foil M.D.   On: 09/13/2018 23:51    Assessment: 72 y.o. with newly diagnosed high-grade B-cell lymphoma, recently discharged from hospital after biopsy, presented to the emergency room with progressive weakness, dyspnea, chills and fever, anorexia, and extremity edema.  1. Sepsis, with fever, chill, probable aspiration pneumonia, improving  2.  Acute hypoxic respite failure, secondary to lymphoma in lungs, versus infections, improving  3.  Newly diagnosed high-grade B-cell lymphoma with diffuse adenopathy and lung involvement, stage  IV, IPI high risk, s/p first cycle R-CHOP on 12/6 4.  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 Hypercalcemia secondary to malignancy, s/p zometa 12/5, resolved now  7.  Hyperbilirubinemia, secondary to hemolysis, imrproving  8. Severe arm and leg edema, secondary to severe adenopathy and hypoalbuminemia, improving  9. Deconditioning  10 left ptosis and dilated right pupil  11.  thrombocytopenia, secondary to lymphoma and chemotherapy, improving  12. Aspiration   Plan:  -Her anemia got worse today, she will receive 2 units of blood today with Lasix. -No need for platelet transfusion unless platelet below 20 K, or active bleeding. -She is more awake and alert and to cooperate, I recommend repeating swallow evaluation in the next few days -I encouraged her to participate in physical therapy and Occupational Therapy, we discussed short-term rehab after discharge, she is reluctant, and wants to go home. -I discussed with Dr. Grandville Silos, he plan to give albumin infusion for her anasarca, and stop antibiotics after today's dose. -Updated her daughter at the bedside today, I will continue follow-up closely.    Truitt Merle  09/14/2018

## 2018-09-14 NOTE — Progress Notes (Signed)
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 She was discharged home in a stable condition, but apparently she has been developing worsening weakness, generalized, worse with exertion, no improving factors, associated with malaise, chills and poor appetite. Over the last 24 hours her symptoms have been more severe to the point where she has difficulty standing. She has persistent left upper extremity edema which has been painful, sharp in nature up to 10 out of 10 intensity. Her weakness has led to dyspnea which has been severe in intensity,and triggered her emergency room visit today.  ED Course:Patient was found ill looking appearing, febrile, positive leukocytosis, concern for systemic infection, referred for admission for evaluation.  **Oncology concerned about her progression of her high-grade B-cell lymphoma and is planning on starting chemotherapy today and have ordered a PICC line placement which was done by IR Dr. Annamaria Boots.  Sepsis physiology is improved.  Patient still remains hypercalcemic so oncology gave the patient Zometa and continued with IV fluidsbut have now discontinued. She started her Rituxan the day before yesterday but did not complete it because she had some mild infusion reactions.  The completion of her chemotherapy yesterday and this morning she is doing well but then she started coughing with her medication became slightly altered and confused after pain medication.  She is made n.p.o. and SLP evaluated and did an MBS which showed patient is aspirating.  SLP recommended full liquid diet with known  aspiration risks and will discuss with the family about pain to tube and tube feedings.  Patient's hemoglobin dropped so we will give another unit of PRBCs and will also give another dose of IV Lasix.  Will change antibiotics to IV Unasyn.    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 Likely related to progressive high-grade B-cell lymphoma with pulmonary involvement comp located by suspected aspiration - ?? Etiology.  Likely secondary to progressive high-grade B-cell lymphoma with pulmonary involvement.  There was also concern for suspected aspiration pneumonia.  -IVF discontinued and received Lasix -IV antibiotic therapy withIVceftriaxone and IV Metronidazole was started.  Patient status post IV Rocephin.  IV Flagyl discontinued.  Patient now on IV Unasyn.  Repeat chest x-ray negative for any acute infiltrate however concerning for persistent bilateral pleural effusions.  -Discontinue IV Unasyn after today's dose as patient with event 8 days of antibiotic treatment.   -Patient seen by speech therapy and noted to be high aspiration risk and patient started on a full liquid diet.   -Panda to was attempted per RN however patient unable to tolerate at this time.   -Chest x-ray done and showed stable chest x-ray demonstrating pulmonary lymphoma and bilateral pleural effusions. -May need to consult IR for Panda tube placement.  High-grade B-cell Lymphoma -CT head unremarkable.   -Patient with left upper extremity pain and swelling likely secondary to high-grade B-cell lymphoma.   -Oncology consulted and patient started on R-CHOP  and patient to got Rituxan and CHOP on different days -Oncology Dr. Burr Medico does not recommend a bone  marrow biopsy as this would not change restaging imaging and management -Dr. Burr Medico has ordered [facility] to rule out double hit or triple hit high-grade lymphoma -PICC placed. -Solumedrol started for Hemolysis and now on po Prednisone -Rituxan was initiated, but unable to complete due to infusion reactions so will get repeat treatment with Benadryl, Tylenol and Steroids and remainder of Rituximab and CHOP received chemotherapy on 09/10/18. -Pain Control with Acetaminophen, Hydrocodone-Acetaminophen 1 tab po q6hprn, and IV Morphine 2 mg q2hprn -With her Anasarca from Cancer she is going to be given IV Lasix from Oncology and IV fluids have now been stopped; patient status post packed red blood cells.   -Due to low albumin levels will place on IV albumin x1 day.   -Per oncology.   Acute encephalopathy likely in the setting of hypercalcemia, Hypercalcemia is improving. -Slowly improving clinically however not at baseline.  -Head CT noncontrast showed no evidence of any acute intracranial abnormality.  There is mild chronic small vessel ischemic changes in the cerebral white matter.  There is also mild chronic appearing paranasal sinusitis. -Oncology recommended MRI to rule out CNS lymphoma however unable to be done as history of pacemaker placement.  Also concerns for aspiration pneumonia.  -Patient seen by speech therapy and patient underwent MBS which showed high risk of aspiration.  Patient currently on full liquid diet.   -Panda tube ordered however patient unable to tolerate at this time.   -Continue full liquids. -Continue monitor her mental status -Per oncology if worsening mental status may need an LP to rule out CNS lymphoma.  Hypercalcemia likely of Malignancy -Calcium level was 8.4 today and corrected calcium likely higher than that due to albumin level -IV fluid is now been discontinued -Patient was given zoledronic acid IV 4 mg once 09/08/2018 per oncology -Continue to Monitor   Iron  deficiency anemia/Hemolysis from [address] -Patient's hemoglobin/hematocrit went from 8.2/27.5 -> 6.9/24.2 -Patient status post transfusion of 4 units packed red blood cells.   -Hemoglobin 7.1 this morning.   -Repeat H&H if hemoglobin 7.1 or less will transfuse packed red blood cells.  -Total bilirubin at 0.9.  Last LDH was 309. -Patient with no overt signs of bleeding. -Patient on steroids per oncology. -Continue oral iron supplementation. - Follow.   Third-degree AV block s/p pacemaker -Patient currently has a V paced rhythm.  Obesity -Estimated body mass index is 30.85 kg/m as calculated from the following:   Height as of this encounter: 5\' 6"  (1.676 m).   Weight as of this encounter: 86.7 kg. -Weight Loss Counseling given  Non-Severe (moderate) malnutrition in the context of acute illness/injury -Nutrition is consulted for further evaluation recommendations -Continue with Ensure Enlive p.o. twice daily -Pending to was attempted early on today however unsuccessful as patient unable to tolerate tube placement.   -Continue current full liquid diet.   Left Arm Weakness and Swelling -Likely secondary to high-grade lymphoma.   -Patient status post upper extremity Dopplers done 08/29/2018 which were negative for DVT.   -CT angiogram chest done 2019 with enlargement of left axillary, subpectoral chest wall lymph node masses with encasement of left subclavian and axillary vascular structures.  Enlargement of right hilar and mediastinal lymph nodes since prior study.  Normal arterial patency demonstrated of the left subclavian and axillary arteries.   Pupil Discrepancy -Noted to be new -No Vision loss -Head CT Negative -Continue to Monitor  Thrombocytopenia -In the setting of Chemotherapy; Platelet Count is now 58 -Patient with no signs of bleeding.   -Lovenox discontinued.   -Per oncology.   Elevated BUN -In the setting of Steroids -Continue to Monitor as it is  worsening and repeat CMP in AM   Leukocytosis -Likely reactive.  Leukocytosis trended down.  Patient currently afebrile.   -Patient on IV antibiotics presumably for possible aspiration pneumonia.   -Discontinue IV antibiotics after today's dose as patient has had 8 days of antibiotics.   Hypernatremia -Place on gentle hydration with D5W and follow.    DVT prophylaxis: SCDs Code Status: Full Family Communication: Updated patient and daughter at bedside. Disposition Plan: Pending work-up.   Consultants:   Hematology/oncology Dr. Burr Medico  Procedures:   CT head 09/07/2018  CT angiogram chest 09/07/2018  Chest x-ray 09/07/2018, 09/09/2018, 09/11/2018, 09/14/2018  PICC line  R- CHOP   Antimicrobials:   Unasyn 09/12/2018>>>>> 09/14/2018  IV Rocephin 09/07/2018>>>>> 09/12/2018  IV Flagyl 09/07/2018>>>>> 09/08/2018  Status post 4 units packed red blood cells.     Subjective: Patient noted to have some epigastric chest pain yesterday evening which improved with belching.  Patient denies any significant shortness of breath.  Patient denies any chest pain.  Currently tolerating full liquids however some coughing noted per daughter at bedside.  Patient alert and oriented to self place and time.  Objective: Vitals:   09/13/18 2219 09/13/18 2224 09/14/18 0002 09/14/18 0524  BP: (!) 177/83 (!) 164/74 (!) 135/58 (!) 143/68  Pulse: 93 94 87 86  Resp: (!) 22 (!) 21 (!) 22 20  Temp:   98.3 F (36.8 C) (!) 97.5 F (36.4 C)  TempSrc:   Axillary Oral  SpO2: 99% 98% 98% 99%  Weight:      Height:        Intake/Output Summary (Last 24 hours) at 09/14/2018 1139 Last data filed at 09/14/2018 0811 Gross per 24 hour  Intake 559.57 ml  Output 2200 ml  Net -1640.43 ml   Filed Weights   09/08/18 0505 09/09/18 0500 09/13/18 0631  Weight: 89.3 kg 87.8 kg 86.7 kg    Examination:  General exam: Appears calm and comfortable. Eyes: Unequal pupil sizes. Respiratory system: Decreased  breath sounds in the bases.  Some coarse breath sounds anterior lung fields.  No wheezing.  Respiratory effort normal. Cardiovascular system: S1 & S2 heard, RRR. No JVD, murmurs, rubs, gallops or clicks.  Bilateral lower extremity with 1-2+ bilateral lower extremity edema left greater than right.  Left upper extremity with 3+ edema.  Gastrointestinal system: Abdomen is nondistended, soft and nontender. No organomegaly or masses felt. Normal bowel sounds heard. Central nervous system: Alert and oriented. No focal neurological deficits. Extremities: Symmetric 5 x 5 power. Skin: No rashes, lesions or ulcers Psychiatry: Judgement and insight appear normal. Mood & affect appropriate.     Data Reviewed: I have personally reviewed following labs and imaging studies  CBC: Recent Labs  Lab 09/09/18 0428 09/10/18 0500 09/11/18 0430 09/12/18 0615 09/13/18 1133 09/14/18 0449  WBC 9.6 12.0* 12.8* 10.5 14.0* 8.1  NEUTROABS 8.3* 11.0* 11.8* 10.1*  --  7.9*  HGB 8.5* 8.9* 8.5* 7.3* 7.7* 7.1*  HCT 28.8* 30.1* 29.0* 25.1* 25.0* 22.8*  MCV 94.1 93.2 94.5 93.7 93.3 93.4  PLT 149* 120* 91* 66* 85* 56*   Basic Metabolic Panel: Recent Labs  Lab 09/10/18 0500 09/11/18 0430 09/12/18 0615 09/13/18 1133 09/14/18 0449  NA 137 137 139 140 148*  K 4.5 4.9  5.0 4.7 3.8  CL 103 105 108 110 116*  CO2 23 22 22  21* 21*  GLUCOSE 101* 102* 115* 135* 105*  BUN 47* 66* 91* 100* 74*  CREATININE 0.94 1.02* 0.88 0.82 0.79  CALCIUM 12.0* 11.1* 10.3 9.7 8.4*  MG 2.0 2.1 2.6* 2.7* 2.3  PHOS 3.3 4.5 5.9* 5.7* 4.4   GFR: Estimated Creatinine Clearance: 71.6 mL/min (by C-G formula based on SCr of 0.79 mg/dL). Liver Function Tests: Recent Labs  Lab 09/10/18 0500 09/11/18 0430 09/12/18 0615 09/13/18 1133 09/14/18 0449  AST 24 25 17 26 20   ALT 15 16 12 15 11   ALKPHOS 73 68 60 59 50  BILITOT 2.4* 2.1* 1.4* 1.3* 0.9  PROT 4.3* 4.3* 4.0* 4.0* 3.6*  ALBUMIN 2.4* 2.3* 2.3* 2.5* 2.2*   No results for input(s):  LIPASE, AMYLASE in the last 168 hours. No results for input(s): AMMONIA in the last 168 hours. Coagulation Profile: No results for input(s): INR, PROTIME in the last 168 hours. Cardiac Enzymes: No results for input(s): CKTOTAL, CKMB, CKMBINDEX, TROPONINI in the last 168 hours. BNP (last 3 results) No results for input(s): PROBNP in the last 8760 hours. HbA1C: No results for input(s): HGBA1C in the last 72 hours. CBG: No results for input(s): GLUCAP in the last 168 hours. Lipid Profile: No results for input(s): CHOL, HDL, LDLCALC, TRIG, CHOLHDL, LDLDIRECT in the last 72 hours. Thyroid Function Tests: No results for input(s): TSH, T4TOTAL, FREET4, T3FREE, THYROIDAB in the last 72 hours. Anemia Panel: No results for input(s): VITAMINB12, FOLATE, FERRITIN, TIBC, IRON, RETICCTPCT in the last 72 hours. Sepsis Labs: No results for input(s): PROCALCITON, LATICACIDVEN in the last 168 hours.  Recent Results (from the past 240 hour(s))  Culture, blood (Routine x 2)     Status: None   Collection Time: 09/07/18  8:07 AM  Result Value Ref Range Status   Specimen Description   Final    BLOOD LEFT HAND Performed at [facility] [address], [place], [place] [address]    Special Requests   Final    Blood Culture adequate volume BOTTLES DRAWN AEROBIC AND ANAEROBIC   Culture   Final    NO GROWTH 5 DAYS Performed at [facility], [place] [address], [place], [place] [address]    Report Status 09/12/2018 FINAL  Final  Culture, blood (Routine x 2)     Status: None   Collection Time: 09/07/18  8:18 AM  Result Value Ref Range Status   Specimen Description   Final    BLOOD RIGHT ANTECUBITAL Performed at [facility] [address] [place], [place] [address]    Special Requests   Final    BOTTLES DRAWN AEROBIC AND ANAEROBIC Blood Culture adequate volume Performed at [facility] [address] [place], [place] [address]     Culture   Final    NO GROWTH 5 DAYS Performed at [facility], [place] [address], [place], [place] [address]    Report Status 09/12/2018 FINAL  Final  Urine culture     Status: None   Collection Time: 09/07/18  8:22 AM  Result Value Ref Range Status   Specimen Description   Final    URINE, CLEAN CATCH Performed at [facility], [place] [address], [place], [place] [address]    Special Requests   Final    NONE Performed at [facility], [place] [address] [place], [place] [address]  Culture   Final    NO GROWTH Performed at [facility], [place] [address], [place], [place] [address]    Report Status 09/08/2018 FINAL  Final         Radiology Studies: Dg Chest Port 1 View  Result Date: 09/14/2018 CLINICAL DATA:  Shortness of breath. EXAM: PORTABLE CHEST 1 VIEW COMPARISON:  Radiographs 09/11/2018 and 09/09/2018. FINDINGS: 0523 hours. Mild patient rotation to the right. The right arm PICC and right subclavian pacemaker leads appear unchanged. The heart size and mediastinal contours are stable. There is a large hiatal hernia. There are persistent bilateral pleural effusions which appear mildly improved on the right. There is no pneumothorax or confluent airspace opacity. IMPRESSION: No acute findings demonstrated. The right pleural effusion appears slightly smaller. Electronically Signed   By: Richardean Sale M.D.   On: 09/14/2018 09:10   Dg Abd Portable 1v  Result Date: 09/13/2018 CLINICAL DATA:  Constipation EXAM: PORTABLE ABDOMEN - 1 VIEW COMPARISON:  CT 08/31/2018 FINDINGS: Nonobstructed bowel gas pattern. Residual contrast within the colon. Mild to moderate feces retention at the rectum. IMPRESSION: Nonobstructed gas pattern with mild to moderate fecal retention at the rectum Electronically Signed   By: Donavan Foil M.D.   On: 09/13/2018 23:51   Dg Swallowing Func-speech Pathology  Result Date: 09/12/2018 Objective Swallowing Evaluation:  Type of Study: MBS-Modified Barium Swallow Study  Patient Details Name: Patricia Murillo MRN: [id number] Date of Birth: [birth year] Today's Date: 09/12/2018 Time: SLP Start Time (ACUTE ONLY): 1300 -SLP Stop Time (ACUTE ONLY): 1337 SLP Time Calculation (min) (ACUTE ONLY): 37 min Past Medical History: Past Medical History: Diagnosis Date . Arthritis  . Hiatal hernia  . History of chicken pox  . Hyperlipidemia  . Pacemaker  Past Surgical History: Past Surgical History: Procedure Laterality Date . ANKLE SURGERY   . PACEMAKER IMPLANT N/A 04/18/2018  Procedure: PACEMAKER IMPLANT;  Surgeon: Evans Lance, MD;  Location: [facility];  Service: Cardiovascular;  Laterality: N/A; HPI: Patricia Murillo is a 72 y.o. 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  During her hospitalization she underwent left axillary lymph node biopsy.  She was discharged home in a stable condition, but apparently she has been developing worsening weakness, generalized, worse with exertion, no improving factors, associated with malaise, chills and poor appetite.  Over the last 24 hours her symptoms have been more severe to the point where she has difficulty standing. She has persistent left upper extremity edema which has been painful, sharp in nature up to 10 out of 10 intensity.  Her weakness has led to dyspnea which has been severe in intensity, and triggered her emergency room visit today.Pt was made NPO yesterday due to concerns for aspiration.   Today pt is alert, has congested weak cough concerrning for airway protection.  Subjective: The patient was seen sitting upright in bed with her daughter at the bedside.  Assessment / Plan / Recommendation CHL IP CLINICAL IMPRESSIONS 09/12/2018 Clinical Impression Pt presents with moderately severe oropharyngeal dysphagia with sensorimotor deficits.   Decreased oral propulsion and weak pharyngeal motility results in significant pharyngeal more than oral residuals.  Pt does NOT sense residuals and is unable to "hock" to expectorate to clear them.  Chin tuck, head turn postures did NOT decrease residuals.  Pt did aspirate with thin liquids posterior trachea with delayed cough response.  Although she did not aspirate with thicker consistencies, increased residuals present which if aspiration post-swallow are more difficult for pulmonary clearance.  Pt is grossly weak at this time - uncertain of her baseline swallow function.  Pt is a high aspiration/malnutrition risk due to level of dysphagia and weak nonproductive cough.  If she is to consume po intake= it should be with accepted risks and mitigation strategies.  Question if pt's swallow with improve with improved medical status. SLP Visit Diagnosis Dysphagia, oropharyngeal phase (R13.12) Attention and concentration deficit following -- Frontal lobe and executive function deficit following -- Impact on safety and function Severe aspiration risk;Risk for inadequate nutrition/hydration   CHL IP TREATMENT RECOMMENDATION 09/11/2018 Treatment Recommendations Therapy as outlined in treatment plan below   Prognosis 09/12/2018 Prognosis for Safe Diet Advancement Fair Barriers to Reach Goals Severity of deficits;Cognitive deficits;Other (Comment) Barriers/Prognosis Comment -- CHL IP DIET RECOMMENDATION 09/12/2018 SLP Diet Recommendations (No Data) Liquid Administration via Cup Medication Administration Crushed with puree Compensations Slow rate;Small sips/bites;Follow solids with liquid Postural Changes Remain semi-upright after after feeds/meals (Comment);Seated upright at 90 degrees   CHL IP OTHER RECOMMENDATIONS 09/12/2018 Recommended Consults -- Oral Care Recommendations Oral care before and after PO Other Recommendations Have oral suction available   CHL IP FOLLOW UP RECOMMENDATIONS 09/12/2018 Follow up Recommendations (No  Data)   CHL IP FREQUENCY AND DURATION 09/12/2018 Speech Therapy Frequency (ACUTE ONLY) min 2x/week Treatment Duration 1 week      CHL IP ORAL PHASE 09/12/2018 Oral Phase Impaired Oral - Pudding Teaspoon -- Oral - Pudding Cup -- Oral - Honey Teaspoon -- Oral - Honey Cup -- Oral - Nectar Teaspoon Weak lingual manipulation;Reduced posterior propulsion;Lingual/palatal residue Oral - Nectar Cup Weak lingual manipulation;Reduced posterior propulsion;Lingual/palatal residue Oral - Nectar Straw Weak lingual manipulation;Reduced posterior propulsion;Lingual/palatal residue Oral - Thin Teaspoon Weak lingual manipulation;Reduced posterior propulsion Oral - Thin Cup Weak lingual manipulation;Reduced posterior propulsion Oral - Thin Straw Reduced posterior propulsion Oral - Puree Weak lingual manipulation;Reduced posterior propulsion Oral - Mech Soft Weak lingual manipulation;Reduced posterior propulsion Oral - Regular -- Oral - Multi-Consistency -- Oral - Pill -- Oral Phase - Comment --  CHL IP PHARYNGEAL PHASE 09/12/2018 Pharyngeal Phase Impaired Pharyngeal- Pudding Teaspoon -- Pharyngeal -- Pharyngeal- Pudding Cup -- Pharyngeal -- Pharyngeal- Honey Teaspoon -- Pharyngeal -- Pharyngeal- Honey Cup -- Pharyngeal -- Pharyngeal- Nectar Teaspoon Reduced pharyngeal peristalsis;Reduced epiglottic inversion;Reduced tongue base retraction;Pharyngeal residue - valleculae;Pharyngeal residue - pyriform Pharyngeal -- Pharyngeal- Nectar Cup Reduced pharyngeal peristalsis;Reduced epiglottic inversion;Reduced laryngeal elevation;Reduced airway/laryngeal closure;Reduced tongue base retraction;Pharyngeal residue - valleculae;Pharyngeal residue - pyriform;Penetration/Aspiration during swallow Pharyngeal Material enters airway, remains ABOVE vocal cords and not ejected out Pharyngeal- Nectar Straw Reduced pharyngeal peristalsis;Reduced epiglottic inversion;Reduced anterior laryngeal mobility;Reduced airway/laryngeal closure;Reduced tongue base  retraction;Reduced laryngeal elevation;Pharyngeal residue - valleculae;Pharyngeal residue - pyriform Pharyngeal -- Pharyngeal- Thin Teaspoon Reduced pharyngeal peristalsis;Reduced epiglottic inversion;Reduced laryngeal elevation;Reduced airway/laryngeal closure;Reduced tongue base retraction;Pharyngeal residue - valleculae;Pharyngeal residue - pyriform Pharyngeal -- Pharyngeal- Thin Cup Reduced pharyngeal peristalsis;Reduced epiglottic inversion;Reduced laryngeal elevation;Reduced airway/laryngeal closure;Reduced tongue base retraction;Pharyngeal residue - valleculae;Pharyngeal residue - pyriform;Penetration/Aspiration during swallow Pharyngeal Material enters airway, passes BELOW cords without attempt by patient to eject out (silent aspiration) Pharyngeal- Thin Straw Reduced pharyngeal peristalsis;Reduced epiglottic inversion;Reduced laryngeal elevation;Reduced airway/laryngeal closure;Reduced tongue base retraction;Pharyngeal residue - valleculae;Pharyngeal residue - pyriform Pharyngeal Material enters airway, passes BELOW cords and not ejected out despite cough attempt by patient Pharyngeal- Puree Reduced epiglottic inversion;Reduced pharyngeal peristalsis;Reduced tongue base retraction;Reduced airway/laryngeal closure;Reduced laryngeal elevation;Pharyngeal residue - valleculae;Pharyngeal residue -  pyriform Pharyngeal -- Pharyngeal- Mechanical Soft Reduced pharyngeal peristalsis;Reduced epiglottic inversion;Reduced tongue base retraction;Pharyngeal residue - valleculae;Reduced airway/laryngeal closure Pharyngeal -- Pharyngeal- Regular -- Pharyngeal -- Pharyngeal- Multi-consistency -- Pharyngeal -- Pharyngeal- Pill -- Pharyngeal -- Pharyngeal Comment --  CHL IP CERVICAL ESOPHAGEAL PHASE 09/12/2018 Cervical Esophageal Phase Impaired Pudding Teaspoon -- Pudding Cup -- Honey Teaspoon -- Honey Cup -- Nectar Teaspoon -- Nectar Cup -- Nectar Straw -- Thin Teaspoon -- Thin Cup -- Thin Straw -- Puree -- Mechanical Soft --  Regular -- Multi-consistency -- Pill -- Cervical Esophageal Comment upon esophageal sweep, pt appeared clear Luanna Salk, MS [facility] SLP Acute Rehab Services Pager [phone] Office [phone] Macario Golds 09/12/2018, 5:29 PM                   Scheduled Meds: . sodium chloride   Intravenous Once  . acyclovir  400 mg Oral BID  . allopurinol  300 mg Oral Daily  . cholecalciferol  2,000 Units Oral Daily  . feeding supplement (ENSURE ENLIVE)  237 mL Oral BID BM  . folic acid  1 mg Oral Daily  . mouth rinse  15 mL Mouth Rinse BID  . mupirocin ointment   Topical BID  . pantoprazole (PROTONIX) IV  40 mg Intravenous Q12H  . polyethylene glycol  17 g Oral Daily  . predniSONE  40 mg Oral Q breakfast  . senna-docusate  1 tablet Oral BID   Continuous Infusions: . sodium chloride Stopped (09/12/18 1805)  . sodium chloride    . ampicillin-sulbactam (UNASYN) IV 1.5 g (09/14/18 1018)  . dextrose 75 mL/hr at 09/14/18 1043     LOS: 7 days    Time spent: 40 mins    Irine Seal, MD [facility] Pager [phone]  If 7PM-7AM, please contact night-coverage [email or web address] Password [facility] 09/14/2018, 11:39 AM

## 2018-09-14 NOTE — Progress Notes (Signed)
Pt unable to tolerate tube placement at bedside. IV medication given prior. MD made aware.

## 2018-09-14 NOTE — Progress Notes (Signed)
CRITICAL VALUE ALERT  Critical Value: hgb 6.7  Date & Time Notied:  09/14/18 1547   Provider Notified: Grandville Silos, MD   Orders Received/Actions taken: waiting response.

## 2018-09-15 ENCOUNTER — Inpatient Hospital Stay (HOSPITAL_COMMUNITY): Payer: Medicare HMO

## 2018-09-15 LAB — BPAM RBC
Blood Product Expiration Date: 201912232359
Blood Product Expiration Date: 202001012359
Blood Product Expiration Date: 202001052359
Blood Product Expiration Date: 202001052359
ISSUE DATE / TIME: 201912102040
ISSUE DATE / TIME: 201912111830
ISSUE DATE / TIME: 201912112237
ISSUE DATE / TIME: 201912092006
Unit Type and Rh: 5100
Unit Type and Rh: 5100
Unit Type and Rh: 5100
Unit Type and Rh: 5100

## 2018-09-15 LAB — COMPREHENSIVE METABOLIC PANEL
ALT: 14 U/L (ref 0–44)
AST: 21 U/L (ref 15–41)
Albumin: 2.9 g/dL — ABNORMAL LOW (ref 3.5–5.0)
Alkaline Phosphatase: 42 U/L (ref 38–126)
Anion gap: 6 (ref 5–15)
BUN: 48 mg/dL — ABNORMAL HIGH (ref 8–23)
CO2: 26 mmol/L (ref 22–32)
Calcium: 9.1 mg/dL (ref 8.9–10.3)
Chloride: 111 mmol/L (ref 98–111)
Creatinine, Ser: 0.55 mg/dL (ref 0.44–1.00)
GFR calc Af Amer: >60 mL/min (ref >60)
Glucose, Bld: 93 mg/dL (ref 70–99)
Potassium: 3.9 mmol/L (ref 3.5–5.1)
Sodium: 143 mmol/L (ref 135–145)
TOTAL PROTEIN: 4.3 g/dL — AB (ref 6.5–8.1)
Total Bilirubin: 1.3 mg/dL — ABNORMAL HIGH (ref 0.3–1.2)

## 2018-09-15 LAB — TYPE AND SCREEN
ABO/RH(D): O POS
Antibody Screen: NEGATIVE
Unit division: 0
Unit division: 0
Unit division: 0
Unit division: 0

## 2018-09-15 LAB — OCCULT BLOOD X 1 CARD TO LAB, STOOL: Fecal Occult Bld: POSITIVE — AB

## 2018-09-15 LAB — CBC
HCT: 23.2 % — ABNORMAL LOW (ref 36.0–46.0)
Hemoglobin: 7.4 g/dL — ABNORMAL LOW (ref 12.0–15.0)
MCH: 29.1 pg (ref 26.0–34.0)
MCHC: 31.9 g/dL (ref 30.0–36.0)
MCV: 91.3 fL (ref 80.0–100.0)
Platelets: 49 10*3/uL — ABNORMAL LOW (ref 150–400)
RBC: 2.54 MIL/uL — AB (ref 3.87–5.11)
RDW: 17 % — AB (ref 11.5–15.5)
WBC: 4 10*3/uL (ref 4.0–10.5)
nRBC: 0 % (ref 0.0–0.2)

## 2018-09-15 LAB — HEMOGLOBIN AND HEMATOCRIT, BLOOD
HEMATOCRIT: 23.7 % — AB (ref 36.0–46.0)
Hemoglobin: 7.6 g/dL — ABNORMAL LOW (ref 12.0–15.0)

## 2018-09-15 LAB — LACTATE DEHYDROGENASE: LDH: 176 U/L (ref 98–192)

## 2018-09-15 MED ORDER — DEXTROSE 5 % IV SOLN
INTRAVENOUS | Status: DC
  Administered 2018-09-15: 11:00:00 via INTRAVENOUS

## 2018-09-15 MED ORDER — ALBUMIN HUMAN 25 % IV SOLN
25.0000 g | Freq: Four times a day (QID) | INTRAVENOUS | Status: AC
  Administered 2018-09-15 – 2018-09-16 (×4): 25 g via INTRAVENOUS
  Filled 2018-09-15 (×4): qty 100

## 2018-09-15 MED ORDER — FUROSEMIDE 10 MG/ML IJ SOLN
20.0000 mg | Freq: Once | INTRAMUSCULAR | Status: AC
  Administered 2018-09-15: 20 mg via INTRAVENOUS
  Filled 2018-09-15: qty 2

## 2018-09-15 NOTE — Progress Notes (Signed)
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 She was discharged home in a stable condition, but apparently she has been developing worsening weakness, generalized, worse with exertion, no improving factors, associated with malaise, chills and poor appetite. Over the last 24 hours her symptoms have been more severe to the point where she has difficulty standing. She has persistent left upper extremity edema which has been painful, sharp in nature up to 10 out of 10 intensity. Her weakness has led to dyspnea which has been severe in intensity,and triggered her emergency room visit today.  ED Course:Patient was found ill looking appearing, febrile, positive leukocytosis, concern for systemic infection, referred for admission for evaluation.  **Oncology concerned about her progression of her high-grade B-cell lymphoma and is planning on starting chemotherapy today and have ordered a PICC line placement which was done by IR Dr. Annamaria Boots.  Sepsis physiology is improved.  Patient still remains hypercalcemic so oncology gave the patient Zometa and continued with IV fluidsbut have now discontinued. She started her Rituxan the day before yesterday but did not complete it because she had some mild infusion reactions.  The completion of her chemotherapy yesterday and this morning she is doing well but then she started coughing with her medication became slightly altered and confused after pain medication.  She is made n.p.o. and SLP evaluated and did an MBS which showed patient is aspirating.  SLP recommended full liquid diet with known  aspiration risks and will discuss with the family about pain to tube and tube feedings.  Patient's hemoglobin dropped so we will give another unit of PRBCs and will also give another dose of IV Lasix.  Will change antibiotics to IV Unasyn.    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 Likely related to progressive high-grade B-cell lymphoma with pulmonary involvement comp located by suspected aspiration - ?? Etiology.  Likely secondary to progressive high-grade B-cell lymphoma with pulmonary involvement.  There was also concern for suspected aspiration pneumonia.  -IVF discontinued and received Lasix -IV antibiotic therapy withIVceftriaxone and IV Metronidazole was started.  Patient status post IV Rocephin.  IV Flagyl discontinued.  Patient now on IV Unasyn.  Repeat chest x-ray negative for any acute infiltrate however concerning for persistent bilateral pleural effusions.  -Discontinued IV Unasyn after 09/14/2018's dose as patient would have had 8 days of antibiotic treatment. -Patient seen by speech therapy and noted to be high aspiration risk and patient started on a full liquid diet.   -Panda was attempted per RN however patient unable to tolerate at this time.   -Chest x-ray done and showed stable chest x-ray demonstrating pulmonary lymphoma and bilateral pleural effusions. -Patient received doses of IV Lasix yesterday in between blood transfusions with a urine output of 3.050 L.   -Patient for modified barium swallow today.   -Follow.   High-grade B-cell Lymphoma -CT head unremarkable.   -  Patient with left upper extremity pain and swelling likely secondary to high-grade B-cell lymphoma.  Left  upper extremity swelling improving. -Oncology consulted and patient started on R-CHOP and patient to got Rituxan and CHOP on different days -Oncology Dr. Burr Medico does not recommend a bone marrow biopsy as this would not change restaging imaging and management -Dr. Burr Medico has ordered [facility] to rule out double hit or triple hit high-grade lymphoma -PICC placed. -Solumedrol started for Hemolysis and now on po Prednisone -Rituxan was initiated, but unable to complete due to infusion reactions so will get repeat treatment with Benadryl, Tylenol and Steroids and remainder of Rituximab and CHOP received chemotherapy on 09/10/18. -Pain Control with Acetaminophen, Hydrocodone-Acetaminophen 1 tab po q6hprn, and IV Morphine 2 mg q2hprn -With her Anasarca from Cancer she is going to be given IV Lasix from Oncology and IV fluids have now been stopped; patient status post packed red blood cells.   -Due to low albumin levels patient given IV albumin for the past 24 hours.  We will give IV albumin for another 24 hours.  -Per oncology.   Acute encephalopathy likely in the setting of hypercalcemia, Hypercalcemia is improving. -Slowly improving clinically however not at baseline.  -Head CT noncontrast showed no evidence of any acute intracranial abnormality.  There is mild chronic small vessel ischemic changes in the cerebral white matter.  There is also mild chronic appearing paranasal sinusitis. -Oncology recommended MRI to rule out CNS lymphoma however unable to be done as history of pacemaker placement.  Also concerns for aspiration pneumonia.  -Patient seen by speech therapy and patient underwent MBS which showed high risk of aspiration.  Patient currently on full liquid diet.   -Panda tube ordered however patient unable to tolerate at this time.   -Patient currently n.p.o. awaiting modified barium swallow. -Continue monitor her mental status -Per oncology if worsening mental status may need an LP to rule out CNS  lymphoma.  Hypercalcemia likely of Malignancy -Calcium level was  9.1 today and corrected calcium likely higher than that due to albumin level -Patient was given zoledronic acid IV 4 mg once 09/08/2018 per oncology -Continue to Monitor   Iron deficiency anemia/Hemolysis from [place] -Patient's hemoglobin/hematocrit went from 8.2/27.5 -> 6.9/24.2>>> 7.1>>>> 6.7. -Patient status post transfusion of 4 units packed red blood cells.   -Hemoglobin 7.4 this morning after transfusion of 2 more units of packed red blood cells on 09/14/2018..   -Patient denies any overt bleeding.   -Check FOBT.   -Repeat H&H this afternoon and transfusion threshold hemoglobin less than 7.5. -Total bilirubin at 0.9.  Last LDH was 176 from 309.  LDH trending down. -Patient with no overt signs of bleeding. -Patient on steroids per oncology. -Continue oral iron supplementation. - Follow. -Collagen following.  Third-degree AV block s/p pacemaker -Patient currently has a V paced rhythm.  Obesity -Estimated body mass index is 30.85 kg/m as calculated from the following:   Height as of this encounter: 5' 6"  (1.676 m).   Weight as of this encounter: 86.7 kg. -Weight Loss Counseling given  Non-Severe (moderate) malnutrition in the context of acute illness/injury -Nutrition is consulted for further evaluation recommendations -Continue with Ensure Enlive p.o. twice daily -Panda to was attempted early on today however unsuccessful as patient unable to tolerate tube placement.   -Repeat modified barium swallow pending today. -Patient was placed on IV albumin for 24 hours.  Repeat IV albumin for another 24 hours.  Follow.  Left Arm Weakness and Swelling -Likely secondary  to high-grade lymphoma.   -Patient status post upper extremity Dopplers done 08/29/2018 which were negative for DVT.   -CT angiogram chest done 2019 with enlargement of left axillary, subpectoral chest wall lymph node masses with encasement of  left subclavian and axillary vascular structures.  Enlargement of right hilar and mediastinal lymph nodes since prior study.  Normal arterial patency demonstrated of the left subclavian and axillary arteries.  Swelling improved over the past 24 hours with diuretics and IV albumin.  Pupil Discrepancy -Noted to be new -No Vision loss -Head CT Negative -Continue to Monitor   Thrombocytopenia -In the setting of Chemotherapy; Platelet Count is now 49 -Patient with no signs of bleeding.   -Lovenox discontinued.   -Per oncology transfuse for platelet count less than 20.   Elevated BUN -In the setting of Steroids -Continue to Monitor as it is worsening and repeat CMP in AM  -Continue PPI.  Leukocytosis -Likely reactive.  Leukocytosis trended down.  Patient currently afebrile.   -Patient on IV antibiotics presumably for possible aspiration pneumonia.   -Discontinued IV antibiotics on 09/15/2018.    Hypernatremia -Improved on gentle hydration with D5W.  Continue D5W at 75 cc an hour for the next 24 hours.      DVT prophylaxis: SCDs Code Status: Full Family Communication: Updated patient and daughter at bedside. Disposition Plan: Pending work-up.   Consultants:   Hematology/oncology Dr. Burr Medico  Procedures:   CT head 09/07/2018  CT angiogram chest 09/07/2018  Chest x-ray 09/07/2018, 09/09/2018, 09/11/2018, 09/14/2018  PICC line  R- CHOP   Repeat modified barium swallow pending 09/15/2018  Antimicrobials:   Unasyn 09/12/2018>>>>> 09/15/2018  IV Rocephin 09/07/2018>>>>> 09/12/2018  IV Flagyl 09/07/2018>>>>> 09/08/2018  Status post 4 units packed red blood cells.  2 units packed red blood cells 09/14/2018   Subjective: Patient sitting up in bed.  Asking when she can go home.  Denies any chest pain.  Denies any shortness of breath.  More alert today.  Feeling better.  Tolerating full liquids.  Currently n.p.o. in anticipation of modified barium swallow.  Patient denies any  overt bleeding.  Objective: Vitals:   09/14/18 2245 09/14/18 2302 09/15/18 0120 09/15/18 0630  BP: (!) 132/99 (!) 149/64 (!) 155/65 (!) 117/58  Pulse: 81 80 74 73  Resp: 20  16 16   Temp: (!) 97.4 F (36.3 C) 97.7 F (36.5 C) 97.7 F (36.5 C) (!) 97.3 F (36.3 C)  TempSrc: Axillary Axillary Axillary Axillary  SpO2: 100% 100% 100% 100%  Weight:      Height:        Intake/Output Summary (Last 24 hours) at 09/15/2018 1115 Last data filed at 09/15/2018 0105 Gross per 24 hour  Intake 1757.71 ml  Output 1950 ml  Net -192.29 ml   Filed Weights   09/08/18 0505 09/09/18 0500 09/13/18 0631  Weight: 89.3 kg 87.8 kg 86.7 kg    Examination:  General exam: NAD Eyes: Unequal pupil sizes. Respiratory system: Decreased breath sounds in the bases.  No wheezing.  Normal respiratory effort.  Speaking in full sentences.  No use of accessory muscles of respiration. Respiratory effort normal. Cardiovascular system: Regular rate rhythm no murmurs rubs or gallops.  No JVD.  Bilateral upper extremity edema left greater than right 1-2.  Lower extremity edema 2+.  Decreasing left upper extremity edema.  Gastrointestinal system: Abdomen is soft, nontender, nondistended, positive bowel sounds.  No rebound.  No guarding.  Central nervous system: Alert and oriented. No focal neurological deficits. Extremities: Symmetric  5 x 5 power. Skin: No rashes, lesions or ulcers Psychiatry: Judgement and insight appear normal. Mood & affect appropriate.     Data Reviewed: I have personally reviewed following labs and imaging studies  CBC: Recent Labs  Lab 09/09/18 0428 09/10/18 0500 09/11/18 0430 09/12/18 0615 09/13/18 1133 09/14/18 0449 09/14/18 1300 09/15/18 0555  WBC 9.6 12.0* 12.8* 10.5 14.0* 8.1  --  4.0  NEUTROABS 8.3* 11.0* 11.8* 10.1*  --  7.9*  --   --   HGB 8.5* 8.9* 8.5* 7.3* 7.7* 7.1* 6.7* 7.4*  HCT 28.8* 30.1* 29.0* 25.1* 25.0* 22.8* 21.7* 23.2*  MCV 94.1 93.2 94.5 93.7 93.3 93.4  --   91.3  PLT 149* 120* 91* 66* 85* 56*  --  49*   Basic Metabolic Panel: Recent Labs  Lab 09/10/18 0500 09/11/18 0430 09/12/18 0615 09/13/18 1133 09/14/18 0449 09/15/18 0555  NA 137 137 139 140 148* 143  K 4.5 4.9 5.0 4.7 3.8 3.9  CL 103 105 108 110 116* 111  CO2 23 22 22  21* 21* 26  GLUCOSE 101* 102* 115* 135* 105* 93  BUN 47* 66* 91* 100* 74* 48*  CREATININE 0.94 1.02* 0.88 0.82 0.79 0.55  CALCIUM 12.0* 11.1* 10.3 9.7 8.4* 9.1  MG 2.0 2.1 2.6* 2.7* 2.3  --   PHOS 3.3 4.5 5.9* 5.7* 4.4  --    GFR: Estimated Creatinine Clearance: 71.6 mL/min (by C-G formula based on SCr of 0.55 mg/dL). Liver Function Tests: Recent Labs  Lab 09/11/18 0430 09/12/18 0615 09/13/18 1133 09/14/18 0449 09/15/18 0555  AST 25 17 26 20 21   ALT 16 12 15 11 14   ALKPHOS 68 60 59 50 42  BILITOT 2.1* 1.4* 1.3* 0.9 1.3*  PROT 4.3* 4.0* 4.0* 3.6* 4.3*  ALBUMIN 2.3* 2.3* 2.5* 2.2* 2.9*   No results for input(s): LIPASE, AMYLASE in the last 168 hours. No results for input(s): AMMONIA in the last 168 hours. Coagulation Profile: No results for input(s): INR, PROTIME in the last 168 hours. Cardiac Enzymes: No results for input(s): CKTOTAL, CKMB, CKMBINDEX, TROPONINI in the last 168 hours. BNP (last 3 results) No results for input(s): PROBNP in the last 8760 hours. HbA1C: No results for input(s): HGBA1C in the last 72 hours. CBG: No results for input(s): GLUCAP in the last 168 hours. Lipid Profile: No results for input(s): CHOL, HDL, LDLCALC, TRIG, CHOLHDL, LDLDIRECT in the last 72 hours. Thyroid Function Tests: No results for input(s): TSH, T4TOTAL, FREET4, T3FREE, THYROIDAB in the last 72 hours. Anemia Panel: No results for input(s): VITAMINB12, FOLATE, FERRITIN, TIBC, IRON, RETICCTPCT in the last 72 hours. Sepsis Labs: No results for input(s): PROCALCITON, LATICACIDVEN in the last 168 hours.  Recent Results (from the past 240 hour(s))  Culture, blood (Routine x 2)     Status: None   Collection  Time: 09/07/18  8:07 AM  Result Value Ref Range Status   Specimen Description   Final    BLOOD LEFT HAND Performed at [facility] [address] [place], [place] [address]    Special Requests   Final    Blood Culture adequate volume BOTTLES DRAWN AEROBIC AND ANAEROBIC   Culture   Final    NO GROWTH 5 DAYS Performed at [facility], [place] [address], [place], [place] [address]    Report Status 09/12/2018 FINAL  Final  Culture, blood (Routine x 2)     Status: None   Collection Time: 09/07/18  8:18 AM  Result  Value Ref Range Status   Specimen Description   Final    BLOOD RIGHT ANTECUBITAL Performed at [facility] [address], [place], [place] [address]    Special Requests   Final    BOTTLES DRAWN AEROBIC AND ANAEROBIC Blood Culture adequate volume Performed at [facility] [address] [place], [place] [address]    Culture   Final    NO GROWTH 5 DAYS Performed at [facility], [place] [address] [place], [place] [address]    Report Status 09/12/2018 FINAL  Final  Urine culture     Status: None   Collection Time: 09/07/18  8:22 AM  Result Value Ref Range Status   Specimen Description   Final    URINE, CLEAN CATCH Performed at [facility], [place] [address] [place], [place] [address]    Special Requests   Final    NONE Performed at [facility], [place] [address], [place], [place] [address]    Culture   Final    NO GROWTH Performed at [facility], [place] [address] [place], [place] [address]    Report Status 09/08/2018 FINAL  Final         Radiology Studies: Dg Chest Port 1 View  Result Date: 09/14/2018 CLINICAL DATA:  Shortness of breath. EXAM: PORTABLE CHEST 1 VIEW COMPARISON:  Radiographs 09/11/2018 and 09/09/2018. FINDINGS: 0523 hours. Mild patient rotation to the right. The right arm PICC and right subclavian pacemaker leads appear unchanged. The  heart size and mediastinal contours are stable. There is a large hiatal hernia. There are persistent bilateral pleural effusions which appear mildly improved on the right. There is no pneumothorax or confluent airspace opacity. IMPRESSION: No acute findings demonstrated. The right pleural effusion appears slightly smaller. Electronically Signed   By: Richardean Sale M.D.   On: 09/14/2018 09:10   Dg Abd Portable 1v  Result Date: 09/13/2018 CLINICAL DATA:  Constipation EXAM: PORTABLE ABDOMEN - 1 VIEW COMPARISON:  CT 08/31/2018 FINDINGS: Nonobstructed bowel gas pattern. Residual contrast within the colon. Mild to moderate feces retention at the rectum. IMPRESSION: Nonobstructed gas pattern with mild to moderate fecal retention at the rectum Electronically Signed   By: Donavan Foil M.D.   On: 09/13/2018 23:51        Scheduled Meds: . sodium chloride   Intravenous Once  . acyclovir  400 mg Oral BID  . allopurinol  300 mg Oral Daily  . cholecalciferol  2,000 Units Oral Daily  . feeding supplement (ENSURE ENLIVE)  237 mL Oral BID BM  . folic acid  1 mg Oral Daily  . mouth rinse  15 mL Mouth Rinse BID  . mupirocin ointment   Topical BID  . pantoprazole (PROTONIX) IV  40 mg Intravenous Q12H  . polyethylene glycol  17 g Oral Daily  . predniSONE  40 mg Oral Q breakfast  . senna-docusate  1 tablet Oral BID   Continuous Infusions: . sodium chloride Stopped (09/12/18 1805)  . sodium chloride    . dextrose 75 mL/hr at 09/15/18 1058     LOS: 8 days    Time spent: 40 mins    Irine Seal, MD [facility] Pager [phone]  If 7PM-7AM, please contact night-coverage [email or web address] Password [email or web address] 09/15/2018, 11:15 AM

## 2018-09-15 NOTE — Progress Notes (Signed)
Patricia Murillo   DOB:24-Jul-1946   WU#:[id number]   GNF#:[id number]  Hem/Onc follow up note   Subjective: Pt passed swallow evaluation today, is very pleased that she can start regular diet again.  She had a few episodes of bowel movement, which show dark, and a stool OB was positive.  She denied history of GI bleeding in the past.  No significant nausea, abdominal pain, or other new symptoms.  Her extremity edema are stable from yesterday.  She is afebrile, vital signs stable.  She received 2 units of blood yesterday, hemoglobin only slightly improved to 7.4 this morning  Objective:  Vitals:   09/15/18 0630 09/15/18 1513  BP: (!) 117/58 (!) 154/61  Pulse: 73 76  Resp: 16 17  Temp: (!) 97.3 F (36.3 C) 97.7 F (36.5 C)  SpO2: 100% 100%    Body mass index is 30.85 kg/m.  Intake/Output Summary (Last 24 hours) at 09/15/2018 1626 Last data filed at 09/15/2018 1541 Gross per 24 hour  Intake 1757.71 ml  Output 2100 ml  Net -342.29 ml     Sclerae unicteric  Oropharynx clear  (+) bulky adenopathy at left axilla and left upper front chest   Lungs clear -- scatter wheezing and rhonchi through both lungs   Heart regular rate and rhythm  Abdomen benign, soft   MSK no focal spinal tenderness, (+) severe edema of the left upper arm and leg, and bilateral lower extremity  Neuro: alert, and follow commands. Pupil right bigger than left, both reactive, mild (+) ptosis of left eye. Her left upper extremity strength is significantly reduced. She moves all extremities    CBG (last 3)  No results for input(s): GLUCAP in the last 72 hours.   Labs:  CBC Latest Ref Rng & Units 09/15/2018 09/15/2018 09/14/2018  WBC 4.0 - 10.5 K/uL - 4.0 -  Hemoglobin 12.0 - 15.0 g/dL 7.6(L) 7.4(L) 6.7(LL)  Hematocrit 36.0 - 46.0 % 23.7(L) 23.2(L) 21.7(L)  Platelets 150 - 400 K/uL - 49(L) -    CMP Latest Ref Rng & Units 09/15/2018 09/14/2018 09/13/2018  Glucose 70 - 99 mg/dL 93 105(H) 135(H)  BUN 8 - 23  mg/dL 48(H) 74(H) 100(H)  Creatinine 0.44 - 1.00 mg/dL 0.55 0.79 0.82  Sodium 135 - 145 mmol/L 143 148(H) 140  Potassium 3.5 - 5.1 mmol/L 3.9 3.8 4.7  Chloride 98 - 111 mmol/L 111 116(H) 110  CO2 22 - 32 mmol/L 26 21(L) 21(L)  Calcium 8.9 - 10.3 mg/dL 9.1 8.4(L) 9.7  Total Protein 6.5 - 8.1 g/dL 4.3(L) 3.6(L) 4.0(L)  Total Bilirubin 0.3 - 1.2 mg/dL 1.3(H) 0.9 1.3(H)  Alkaline Phos 38 - 126 U/L 42 50 59  AST 15 - 41 U/L 21 20 26   ALT 0 - 44 U/L 14 11 15      Urine Studies No results for input(s): UHGB, CRYS in the last 72 hours.  Invalid input(s): UACOL, UAPR, USPG, UPH, UTP, UGL, UKET, UBIL, UNIT, UROB, ULEU, UEPI, UWBC, [place], [place], [place], [place], [place]  Basic Metabolic Panel: Recent Labs  Lab 09/10/18 0500 09/11/18 0430 09/12/18 0615 09/13/18 1133 09/14/18 0449 09/15/18 0555  NA 137 137 139 140 148* 143  K 4.5 4.9 5.0 4.7 3.8 3.9  CL 103 105 108 110 116* 111  CO2 23 22 22  21* 21* 26  GLUCOSE 101* 102* 115* 135* 105* 93  BUN 47* 66* 91* 100* 74* 48*  CREATININE 0.94 1.02* 0.88 0.82 0.79 0.55  CALCIUM 12.0* 11.1* 10.3 9.7 8.4* 9.1  MG 2.0 2.1 2.6* 2.7* 2.3  --   PHOS 3.3 4.5 5.9* 5.7* 4.4  --    GFR Estimated Creatinine Clearance: 71.6 mL/min (by C-G formula based on SCr of 0.55 mg/dL). Liver Function Tests: Recent Labs  Lab 09/11/18 0430 09/12/18 0615 09/13/18 1133 09/14/18 0449 09/15/18 0555  AST 25 17 26 20 21   ALT 16 12 15 11 14   ALKPHOS 68 60 59 50 42  BILITOT 2.1* 1.4* 1.3* 0.9 1.3*  PROT 4.3* 4.0* 4.0* 3.6* 4.3*  ALBUMIN 2.3* 2.3* 2.5* 2.2* 2.9*   No results for input(s): LIPASE, AMYLASE in the last 168 hours. No results for input(s): AMMONIA in the last 168 hours. Coagulation profile No results for input(s): INR, PROTIME in the last 168 hours.  CBC: Recent Labs  Lab 09/09/18 0428 09/10/18 0500 09/11/18 0430 09/12/18 0615 09/13/18 1133 09/14/18 0449 09/14/18 1300 09/15/18 0555 09/15/18 1320  WBC 9.6 12.0* 12.8* 10.5 14.0* 8.1  --  4.0  --    NEUTROABS 8.3* 11.0* 11.8* 10.1*  --  7.9*  --   --   --   HGB 8.5* 8.9* 8.5* 7.3* 7.7* 7.1* 6.7* 7.4* 7.6*  HCT 28.8* 30.1* 29.0* 25.1* 25.0* 22.8* 21.7* 23.2* 23.7*  MCV 94.1 93.2 94.5 93.7 93.3 93.4  --  91.3  --   PLT 149* 120* 91* 66* 85* 56*  --  49*  --    Cardiac Enzymes: No results for input(s): CKTOTAL, CKMB, CKMBINDEX, TROPONINI in the last 168 hours. BNP: Invalid input(s): POCBNP CBG: No results for input(s): GLUCAP in the last 168 hours. D-Dimer No results for input(s): DDIMER in the last 72 hours. Hgb A1c No results for input(s): HGBA1C in the last 72 hours. Lipid Profile No results for input(s): CHOL, HDL, LDLCALC, TRIG, CHOLHDL, LDLDIRECT in the last 72 hours. Thyroid function studies No results for input(s): TSH, T4TOTAL, T3FREE, THYROIDAB in the last 72 hours.  Invalid input(s): FREET3 Anemia work up No results for input(s): VITAMINB12, FOLATE, FERRITIN, TIBC, IRON, RETICCTPCT in the last 72 hours. Microbiology Recent Results (from the past 240 hour(s))  Culture, blood (Routine x 2)     Status: None   Collection Time: 09/07/18  8:07 AM  Result Value Ref Range Status   Specimen Description   Final    BLOOD LEFT HAND Performed at [facility] [address], [place], [place] [address]    Special Requests   Final    Blood Culture adequate volume BOTTLES DRAWN AEROBIC AND ANAEROBIC   Culture   Final    NO GROWTH 5 DAYS Performed at [facility], [place] [address] [place], [place] [address]    Report Status 09/12/2018 FINAL  Final  Culture, blood (Routine x 2)     Status: None   Collection Time: 09/07/18  8:18 AM  Result Value Ref Range Status   Specimen Description   Final    BLOOD RIGHT ANTECUBITAL Performed at [facility] [address], [place], [place] [address]    Special Requests   Final    BOTTLES DRAWN AEROBIC AND ANAEROBIC Blood Culture adequate volume Performed at [facility] [address], [place], [place] [address]    Culture   Final    NO GROWTH 5 DAYS Performed at [facility], [place] [address], [place], [place] [address]    Report Status 09/12/2018 FINAL  Final  Urine culture     Status: None  Collection Time: 09/07/18  8:22 AM  Result Value Ref Range Status   Specimen Description   Final    URINE, CLEAN CATCH Performed at [facility], [place] [address], [place], [place] [address]    Special Requests   Final    NONE Performed at [facility], [place] [address], [place], [place] [address]    Culture   Final    NO GROWTH Performed at [facility], [place] [address], [place], [place] [address]    Report Status 09/08/2018 FINAL  Final      Studies:  Dg Chest Port 1 View  Result Date: 09/14/2018 CLINICAL DATA:  Shortness of breath. EXAM: PORTABLE CHEST 1 VIEW COMPARISON:  Radiographs 09/11/2018 and 09/09/2018. FINDINGS: 0523 hours. Mild patient rotation to the right. The right arm PICC and right subclavian pacemaker leads appear unchanged. The heart size and mediastinal contours are stable. There is a large hiatal hernia. There are persistent bilateral pleural effusions which appear mildly improved on the right. There is no pneumothorax or confluent airspace opacity. IMPRESSION: No acute findings demonstrated. The right pleural effusion appears slightly smaller. Electronically Signed   By: Richardean Sale M.D.   On: 09/14/2018 09:10   Dg Abd Portable 1v  Result Date: 09/13/2018 CLINICAL DATA:  Constipation EXAM: PORTABLE ABDOMEN - 1 VIEW COMPARISON:  CT 08/31/2018 FINDINGS: Nonobstructed bowel gas pattern. Residual contrast within the colon. Mild to moderate feces retention at the rectum. IMPRESSION: Nonobstructed gas pattern with mild to moderate fecal retention at the rectum Electronically Signed   By: Donavan Foil M.D.   On: 09/13/2018 23:51   Dg Swallowing Func-speech Pathology  Result Date:  09/15/2018 Objective Swallowing Evaluation: Type of Study: MBS-Modified Barium Swallow Study  Patient Details Name: TRACHELLE LOW MRN: [id number] Date of Birth: [birth year] Today's Date: 09/15/2018 Time: SLP Start Time (ACUTE ONLY): 1210 -SLP Stop Time (ACUTE ONLY): 1240 SLP Time Calculation (min) (ACUTE ONLY): 30 min Past Medical History: Past Medical History: Diagnosis Date . Arthritis  . Hiatal hernia  . History of chicken pox  . Hyperlipidemia  . Pacemaker  Past Surgical History: Past Surgical History: Procedure Laterality Date . ANKLE SURGERY   . PACEMAKER IMPLANT N/A 04/18/2018  Procedure: PACEMAKER IMPLANT;  Surgeon: Evans Lance, MD;  Location: [facility];  Service: Cardiovascular;  Laterality: N/A; HPI: HADASSA CERMAK is a 72 y.o. 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  During her hospitalization she underwent left axillary lymph node biopsy.  She was discharged home in a stable condition, but apparently she has been developing worsening weakness, generalized, worse with exertion, no improving factors, associated with malaise, chills and poor appetite.  Over the last 24 hours her symptoms have been more severe to the point where she has difficulty standing. She has persistent left upper extremity edema which has been painful, sharp in nature up to 10 out of 10 intensity.  Her weakness has led to dyspnea which has been severe in intensity, and triggered her emergency room visit today.Pt was made NPO yesterday due to concerns for aspiration.   Today pt is alert, has congested weak cough concerrning for airway protection.   Pt underwent MBS yesterday and was placed on full liquid diet with precautions. RN reports pt continues to cough with po intake.  Per note from MD, now plan is  for pt to have small bore feeding tube placed for nutritional support.   Unfortunately pt did not tolerate attempt at insertion of feeding tube.  Repeat swallow evaluation ordered.  SLP follow up to inform family/pt of plan and determine if clinically pt appears improved.    Subjective: Alert Assessment / Plan / Recommendation CHL IP CLINICAL IMPRESSIONS 09/15/2018 Clinical Impression Patient had a MBS on 09/12/18 and patient presented with Moderately severe oropharyngeal dysphagia with sensorimotor deficits. She presents much stronger today and presents with a mild oropharyngeal dysphagia. Oral preparation and bolus transfer was mild evidenced by trace oral residue. Patient initiated swallow at the level of the pyriform sinuses with no penetration or aspiration with cup sips of all sizes. She did have penetration with cup sip with straw, but spontaneously cleared layngeal vestibule. She initiated swallow at the valleculae with puree and cracker bolus, no penetration or aspiration. Significant pharyngeal residue present after 1st swallow, but improved mostly cleared with second spontaneous swallow. Upper 1/3 of esphagus observed to clear well with puree and tablet. Due to significant sensorimotor improvement, recommend patient have regular diet with thin liquids, medication whole in puree. Speech therapy to follow up for continue diet tolerance and swallowing therapy. SLP Visit Diagnosis Dysphagia, oropharyngeal phase (R13.12) Attention and concentration deficit following -- Frontal lobe and executive function deficit following -- Impact on safety and function Mild aspiration risk   CHL IP TREATMENT RECOMMENDATION 09/15/2018 Treatment Recommendations Therapy as outlined in treatment plan below   Prognosis 09/15/2018 Prognosis for Safe Diet Advancement Good Barriers to Reach Goals -- Barriers/Prognosis Comment -- CHL IP DIET RECOMMENDATION 09/15/2018 SLP Diet Recommendations Regular solids;Thin liquid Liquid Administration via Cup;No straw Medication Administration Whole meds with liquid  Compensations Slow rate;Small sips/bites;Other (Comment) Postural Changes Remain semi-upright after after feeds/meals (Comment);Seated upright at 90 degrees   CHL IP OTHER RECOMMENDATIONS 09/15/2018 Recommended Consults -- Oral Care Recommendations Oral care before and after PO Other Recommendations --   CHL IP FOLLOW UP RECOMMENDATIONS 09/14/2018 Follow up Recommendations (No Data)   CHL IP FREQUENCY AND DURATION 09/15/2018 Speech Therapy Frequency (ACUTE ONLY) min 2x/week Treatment Duration 1 week      CHL IP ORAL PHASE 09/15/2018 Oral Phase Impaired Oral - Pudding Teaspoon -- Oral - Pudding Cup Lingual/palatal residue Oral - Honey Teaspoon -- Oral - Honey Cup -- Oral - Nectar Teaspoon NT Oral - Nectar Cup NT Oral - Nectar Straw NT Oral - Thin Teaspoon Lingual/palatal residue Oral - Thin Cup Lingual/palatal residue;Impaired mastication Oral - Thin Straw Lingual/palatal residue Oral - Puree Lingual/palatal residue Oral - Mech Soft Lingual/palatal residue Oral - Regular -- Oral - Multi-Consistency -- Oral - Pill -- Oral Phase - Comment --  CHL IP PHARYNGEAL PHASE 09/15/2018 Pharyngeal Phase Impaired Pharyngeal- Pudding Teaspoon Delayed swallow initiation-vallecula;Pharyngeal residue - valleculae;Pharyngeal residue - pyriform Pharyngeal -- Pharyngeal- Pudding Cup NT Pharyngeal -- Pharyngeal- Honey Teaspoon -- Pharyngeal -- Pharyngeal- Honey Cup -- Pharyngeal -- Pharyngeal- Nectar Teaspoon NT Pharyngeal -- Pharyngeal- Nectar Cup NT Pharyngeal -- Pharyngeal- Nectar Straw NT Pharyngeal -- Pharyngeal- Thin Teaspoon Delayed swallow initiation-pyriform sinuses;Reduced pharyngeal peristalsis;Reduced anterior laryngeal mobility;Reduced laryngeal elevation;Reduced airway/laryngeal closure;Reduced tongue base retraction;Pharyngeal residue - valleculae;Pharyngeal residue - pyriform Pharyngeal -- Pharyngeal- Thin Cup Delayed swallow initiation-pyriform sinuses;Reduced anterior laryngeal mobility;Reduced laryngeal  elevation;Reduced airway/laryngeal closure;Reduced tongue base retraction;Pharyngeal residue - valleculae;Pharyngeal residue - pyriform;Reduced pharyngeal peristalsis Pharyngeal -- Pharyngeal- Thin Straw Delayed swallow initiation-pyriform sinuses;Reduced anterior laryngeal mobility;Reduced laryngeal elevation;Reduced airway/laryngeal closure;Reduced pharyngeal peristalsis;Reduced tongue base retraction;Pharyngeal residue - valleculae;Pharyngeal residue -  pyriform;Penetration/Aspiration during swallow Pharyngeal -- Pharyngeal- Puree Delayed swallow initiation-vallecula;Reduced laryngeal elevation;Reduced airway/laryngeal closure;Reduced tongue base retraction;Pharyngeal residue - valleculae Pharyngeal -- Pharyngeal- Mechanical Soft Delayed swallow initiation-vallecula;Reduced laryngeal elevation;Reduced airway/laryngeal closure;Reduced tongue base retraction Pharyngeal -- Pharyngeal- Regular NT Pharyngeal -- Pharyngeal- Multi-consistency -- Pharyngeal -- Pharyngeal- Pill Delayed swallow initiation-vallecula;Reduced laryngeal elevation;Reduced airway/laryngeal closure Pharyngeal -- Pharyngeal Comment --  CHL IP CERVICAL ESOPHAGEAL PHASE 09/15/2018 Cervical Esophageal Phase WFL Pudding Teaspoon -- Pudding Cup -- Honey Teaspoon -- Honey Cup -- Nectar Teaspoon -- Nectar Cup -- Nectar Straw -- Thin Teaspoon -- Thin Cup -- Thin Straw -- Puree -- Mechanical Soft -- Regular -- Multi-consistency -- Pill -- Cervical Esophageal Comment -- Charlynne Cousins Ward, MA, CCC-SLP 09/15/2018 2:59 PM               Assessment: 72 y.o. with newly diagnosed high-grade B-cell lymphoma, recently discharged from hospital after biopsy, presented to the emergency room with progressive weakness, dyspnea, chills and fever, anorexia, and extremity edema.  1. Sepsis, with fever, chill, probable aspiration pneumonia, improving, off antibiotics now  2.  Acute hypoxic respite failure, secondary to lymphoma in lungs, versus infections, improving  3.   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 Worsening anemia secondary to chemo, GI bleeding  6.  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 Hypercalcemia secondary to malignancy, s/p zometa 12/5, resolved now  8. Severe arm and leg edema, secondary to severe adenopathy and hypoalbuminemia, improving  9. Deconditioning  10 left ptosis and dilated right pupil  11.  thrombocytopenia, secondary to lymphoma and chemotherapy  Plan:  -Her H/H remains to be low despite frequent blood transfusion, likely secondary to chemo, some GI bleeding, and hemoptysis.  Her LDH has came down to normal, bilirubin also came down to normal yesterday, slightly increased today, her hemolysis is likely improved we will continue prednisone 40 mg daily for now. -Continue supportive care with blood transfusion to keep hemoglobin above 7.5, platelet transfusion if platelet less than 20 K or active bleeding -I encouraged her to participate physical therapy, we need to think about her discharge position, home vs SNF. If her blood counts stable for 2-3 days, she may be able to be discharged early next week. Her WBC will likely start dropping this weekend, will give Granix if ANC less than 1.0, and I will consider Neulasta as outpatient next week -I will f/u closely    Truitt Merle  09/15/2018

## 2018-09-15 NOTE — Progress Notes (Signed)
Modified Barium Swallow Progress Note  Patient Details  Name: Patricia Murillo MRN: [id number] Date of Birth: June 01, 1946  Today's Date: 09/15/2018  Modified Barium Swallow completed.  Full report located under Chart Review in the Imaging Section.  Brief recommendations include the following:  Clinical Impression  Patient had a MBS on 09/12/18 and patient presented with Moderately severe oropharyngeal dysphagia with sensorimotor deficits. She presents much stronger today and presents with a mild oropharyngeal dysphagia. Oral preparation and bolus transfer was mild evidenced by trace oral residue. Patient initiated swallow at the level of the pyriform sinuses with no penetration or aspiration with cup sips of all sizes. She did have penetration with cup sip with straw, but spontaneously cleared layngeal vestibule. She initiated swallow at the valleculae with puree and cracker bolus, no penetration or aspiration. Significant pharyngeal residue present after 1st swallow, but improved mostly cleared with second spontaneous swallow. Upper 1/3 of esphagus observed to clear well with puree and tablet. Due to significant sensorimotor improvement, recommend patient have regular diet with thin liquids, medication whole in puree. Speech therapy to follow up for continue diet tolerance and swallowing therapy.   Swallow Evaluation Recommendations       SLP Diet Recommendations: Regular solids;Thin liquid   Liquid Administration via: Cup;No straw   Medication Administration: Whole meds with liquid   Supervision: Staff to assist with self feeding   Compensations: Slow rate;Small sips/bites;Other (Comment)   Postural Changes: Remain semi-upright after after feeds/meals (Comment);Seated upright at 90 degrees   Oral Care Recommendations: Oral care before and after PO        Charlynne Cousins Shane Melby 09/15/2018,2:17 PM

## 2018-09-16 DIAGNOSIS — Z950 Presence of cardiac pacemaker: Secondary | ICD-10-CM

## 2018-09-16 DIAGNOSIS — Z79899 Other long term (current) drug therapy: Secondary | ICD-10-CM

## 2018-09-16 DIAGNOSIS — R5381 Other malaise: Secondary | ICD-10-CM

## 2018-09-16 LAB — COMPREHENSIVE METABOLIC PANEL
ALT: 14 U/L (ref 0–44)
AST: 23 U/L (ref 15–41)
Albumin: 3.7 g/dL (ref 3.5–5.0)
Alkaline Phosphatase: 43 U/L (ref 38–126)
Anion gap: 5 (ref 5–15)
BUN: 32 mg/dL — ABNORMAL HIGH (ref 8–23)
CO2: 28 mmol/L (ref 22–32)
Calcium: 9.2 mg/dL (ref 8.9–10.3)
Chloride: 108 mmol/L (ref 98–111)
Creatinine, Ser: 0.44 mg/dL (ref 0.44–1.00)
GFR calc Af Amer: >60 mL/min (ref >60)
GFR calc non Af Amer: >60 mL/min (ref >60)
Glucose, Bld: 94 mg/dL (ref 70–99)
Potassium: 3.9 mmol/L (ref 3.5–5.1)
Sodium: 141 mmol/L (ref 135–145)
Total Bilirubin: 1.2 mg/dL (ref 0.3–1.2)
Total Protein: 5 g/dL — ABNORMAL LOW (ref 6.5–8.1)

## 2018-09-16 LAB — CBC WITH DIFFERENTIAL/PLATELET
Abs Immature Granulocytes: 0.11 10*3/uL — ABNORMAL HIGH (ref 0.00–0.07)
Basophils Absolute: 0 10*3/uL (ref 0.0–0.1)
Basophils Relative: 0 %
Eosinophils Absolute: 0 10*3/uL (ref 0.0–0.5)
Eosinophils Relative: 0 %
HCT: 21.3 % — ABNORMAL LOW (ref 36.0–46.0)
Hemoglobin: 6.7 g/dL — CL (ref 12.0–15.0)
Immature Granulocytes: 4 %
Lymphocytes Relative: 1 %
Lymphs Abs: 0 10*3/uL — ABNORMAL LOW (ref 0.7–4.0)
MCH: 28.6 pg (ref 26.0–34.0)
MCHC: 31.5 g/dL (ref 30.0–36.0)
MCV: 91 fL (ref 80.0–100.0)
Monocytes Absolute: 0 10*3/uL — ABNORMAL LOW (ref 0.1–1.0)
Monocytes Relative: 0 %
Neutro Abs: 2.6 10*3/uL (ref 1.7–7.7)
Neutrophils Relative %: 95 %
PLATELETS: 44 10*3/uL — AB (ref 150–400)
RBC: 2.34 MIL/uL — AB (ref 3.87–5.11)
RDW: 17.2 % — ABNORMAL HIGH (ref 11.5–15.5)
WBC: 2.8 10*3/uL — AB (ref 4.0–10.5)
nRBC: 0 % (ref 0.0–0.2)

## 2018-09-16 LAB — RETICULOCYTES
Immature Retic Fract: 2 % — ABNORMAL LOW (ref 2.3–15.9)
RBC.: 2.34 MIL/uL — AB (ref 3.87–5.11)
Retic Count, Absolute: 7.7 10*3/uL — ABNORMAL LOW (ref 19.0–186.0)
Retic Ct Pct: 0.3 % — ABNORMAL LOW (ref 0.4–3.1)

## 2018-09-16 LAB — PREPARE RBC (CROSSMATCH)

## 2018-09-16 LAB — GLUCOSE, CAPILLARY: GLUCOSE-CAPILLARY: 84 mg/dL (ref 70–99)

## 2018-09-16 MED ORDER — FUROSEMIDE 10 MG/ML IJ SOLN
20.0000 mg | Freq: Once | INTRAMUSCULAR | Status: AC
  Administered 2018-09-16: 20 mg via INTRAVENOUS
  Filled 2018-09-16 (×2): qty 2

## 2018-09-16 MED ORDER — FUROSEMIDE 10 MG/ML IJ SOLN
20.0000 mg | Freq: Once | INTRAMUSCULAR | Status: DC
  Filled 2018-09-16: qty 2

## 2018-09-16 MED ORDER — SODIUM CHLORIDE 0.9% IV SOLUTION: Freq: Once | INTRAVENOUS | Status: DC

## 2018-09-16 MED ORDER — DIPHENHYDRAMINE HCL 25 MG PO CAPS
25.0000 mg | ORAL_CAPSULE | Freq: Once | ORAL | Status: AC
  Administered 2018-09-16: 25 mg via ORAL
  Filled 2018-09-16: qty 1

## 2018-09-16 MED ORDER — FUROSEMIDE 10 MG/ML IJ SOLN
40.0000 mg | Freq: Once | INTRAMUSCULAR | Status: AC
  Administered 2018-09-16: 40 mg via INTRAVENOUS
  Filled 2018-09-16: qty 4

## 2018-09-16 MED ORDER — BENZONATATE 100 MG PO CAPS
200.0000 mg | ORAL_CAPSULE | Freq: Three times a day (TID) | ORAL | Status: DC | PRN
  Administered 2018-09-17 – 2018-09-22 (×4): 200 mg via ORAL
  Filled 2018-09-16 (×5): qty 2

## 2018-09-16 MED ORDER — ACETAMINOPHEN 325 MG PO TABS
650.0000 mg | ORAL_TABLET | Freq: Once | ORAL | Status: AC
  Administered 2018-09-16: 650 mg via ORAL
  Filled 2018-09-16 (×2): qty 2

## 2018-09-16 MED ORDER — ZOLPIDEM TARTRATE 5 MG PO TABS
5.0000 mg | ORAL_TABLET | Freq: Once | ORAL | Status: AC
  Administered 2018-09-16: 5 mg via ORAL
  Filled 2018-09-16: qty 1

## 2018-09-16 MED ORDER — SODIUM CHLORIDE 0.9% IV SOLUTION
Freq: Once | INTRAVENOUS | Status: AC
  Administered 2018-09-16: 10:00:00 via INTRAVENOUS

## 2018-09-16 NOTE — Progress Notes (Signed)
CRITICAL VALUE ALERT  Critical Value:  HGB  Date & Time Notied:  09/16/18 0627  Provider Notified: [email or web address]  Orders Received/Actions taken: NO INTERVENTIONS AT THIS TIME

## 2018-09-16 NOTE — Progress Notes (Signed)
Patricia Murillo   DOB:08-23-1946   XH#:[id number]   [phone]  Hem/Onc follow up note   Subjective: Pt was sitting on the edge of her bed when I saw her around noon today, overall feels better, with more energy, eating well.  No other new complaints.  Objective:  Vitals:   09/16/18 1644 09/16/18 1711  BP: (!) 185/76 (!) 189/71  Pulse: 67 70  Resp: 16 16  Temp: 98.4 F (36.9 C) 97.8 F (36.6 C)  SpO2: 100%     Body mass index is 30.85 kg/m.  Intake/Output Summary (Last 24 hours) at 09/16/2018 1839 Last data filed at 09/16/2018 1802 Gross per 24 hour  Intake 1098.14 ml  Output 3400 ml  Net -2301.86 ml     Sclerae unicteric  Oropharynx clear  (+) bulky adenopathy at left axilla and left upper front chest   Lungs clear -- scatter wheezing and rhonchi through both lungs   Heart regular rate and rhythm  Abdomen benign, soft   MSK no focal spinal tenderness, (+) severe edema of the left upper arm and leg, and bilateral lower extremity  Neuro: alert, and follow commands. Pupil right bigger than left, both reactive, mild (+) ptosis of left eye. Her left upper extremity strength is significantly reduced. She moves all extremities    CBG (last 3)  Recent Labs    09/16/18 0737  GLUCAP 84     Labs:  CBC Latest Ref Rng & Units 09/16/2018 09/15/2018 09/15/2018  WBC 4.0 - 10.5 K/uL 2.8(L) - 4.0  Hemoglobin 12.0 - 15.0 g/dL 6.7(LL) 7.6(L) 7.4(L)  Hematocrit 36.0 - 46.0 % 21.3(L) 23.7(L) 23.2(L)  Platelets 150 - 400 K/uL 44(L) - 49(L)    CMP Latest Ref Rng & Units 09/16/2018 09/15/2018 09/14/2018  Glucose 70 - 99 mg/dL 94 93 105(H)  BUN 8 - 23 mg/dL 32(H) 48(H) 74(H)  Creatinine 0.44 - 1.00 mg/dL 0.44 0.55 0.79  Sodium 135 - 145 mmol/L 141 143 148(H)  Potassium 3.5 - 5.1 mmol/L 3.9 3.9 3.8  Chloride 98 - 111 mmol/L 108 111 116(H)  CO2 22 - 32 mmol/L 28 26 21(L)  Calcium 8.9 - 10.3 mg/dL 9.2 9.1 8.4(L)  Total Protein 6.5 - 8.1 g/dL 5.0(L) 4.3(L) 3.6(L)  Total  Bilirubin 0.3 - 1.2 mg/dL 1.2 1.3(H) 0.9  Alkaline Phos 38 - 126 U/L 43 42 50  AST 15 - 41 U/L 23 21 20   ALT 0 - 44 U/L 14 14 11      Urine Studies No results for input(s): UHGB, CRYS in the last 72 hours.  Invalid input(s): UACOL, UAPR, USPG, UPH, UTP, UGL, UKET, UBIL, UNIT, UROB, [place], UEPI, UWBC, URBC, UBAC, CAST, [place], [place]  Basic Metabolic Panel: Recent Labs  Lab 09/10/18 0500 09/11/18 0430 09/12/18 0615 09/13/18 1133 09/14/18 0449 09/15/18 0555 09/16/18 0500  NA 137 137 139 140 148* 143 141  K 4.5 4.9 5.0 4.7 3.8 3.9 3.9  CL 103 105 108 110 116* 111 108  CO2 23 22 22  21* 21* 26 28  GLUCOSE 101* 102* 115* 135* 105* 93 94  BUN 47* 66* 91* 100* 74* 48* 32*  CREATININE 0.94 1.02* 0.88 0.82 0.79 0.55 0.44  CALCIUM 12.0* 11.1* 10.3 9.7 8.4* 9.1 9.2  MG 2.0 2.1 2.6* 2.7* 2.3  --   --   PHOS 3.3 4.5 5.9* 5.7* 4.4  --   --    GFR Estimated Creatinine Clearance: 71.6 mL/min (by C-G formula based on SCr of 0.44 mg/dL). Liver  Function Tests: Recent Labs  Lab 09/12/18 0615 09/13/18 1133 09/14/18 0449 09/15/18 0555 09/16/18 0500  AST 17 26 20 21 23   ALT 12 15 11 14 14   ALKPHOS 60 59 50 42 43  BILITOT 1.4* 1.3* 0.9 1.3* 1.2  PROT 4.0* 4.0* 3.6* 4.3* 5.0*  ALBUMIN 2.3* 2.5* 2.2* 2.9* 3.7   No results for input(s): LIPASE, AMYLASE in the last 168 hours. No results for input(s): AMMONIA in the last 168 hours. Coagulation profile No results for input(s): INR, PROTIME in the last 168 hours.  CBC: Recent Labs  Lab 09/10/18 0500 09/11/18 0430 09/12/18 0615 09/13/18 1133 09/14/18 0449 09/14/18 1300 09/15/18 0555 09/15/18 1320 09/16/18 0500  WBC 12.0* 12.8* 10.5 14.0* 8.1  --  4.0  --  2.8*  NEUTROABS 11.0* 11.8* 10.1*  --  7.9*  --   --   --  2.6  HGB 8.9* 8.5* 7.3* 7.7* 7.1* 6.7* 7.4* 7.6* 6.7*  HCT 30.1* 29.0* 25.1* 25.0* 22.8* 21.7* 23.2* 23.7* 21.3*  MCV 93.2 94.5 93.7 93.3 93.4  --  91.3  --  91.0  PLT 120* 91* 66* 85* 56*  --  49*  --  44*   Cardiac  Enzymes: No results for input(s): CKTOTAL, CKMB, CKMBINDEX, TROPONINI in the last 168 hours. BNP: Invalid input(s): POCBNP CBG: Recent Labs  Lab 09/16/18 0737  GLUCAP 84   D-Dimer No results for input(s): DDIMER in the last 72 hours. Hgb A1c No results for input(s): HGBA1C in the last 72 hours. Lipid Profile No results for input(s): CHOL, HDL, LDLCALC, TRIG, CHOLHDL, LDLDIRECT in the last 72 hours. Thyroid function studies No results for input(s): TSH, T4TOTAL, T3FREE, THYROIDAB in the last 72 hours.  Invalid input(s): FREET3 Anemia work up Recent Labs    09/16/18 0500  RETICCTPCT 0.3*   Microbiology Recent Results (from the past 240 hour(s))  Culture, blood (Routine x 2)     Status: None   Collection Time: 09/07/18  8:07 AM  Result Value Ref Range Status   Specimen Description   Final    BLOOD LEFT HAND Performed at [facility] [address], [place], [place] [address]    Special Requests   Final    Blood Culture adequate volume BOTTLES DRAWN AEROBIC AND ANAEROBIC   Culture   Final    NO GROWTH 5 DAYS Performed at [facility], [place] [address] [place], [place] [address]    Report Status 09/12/2018 FINAL  Final  Culture, blood (Routine x 2)     Status: None   Collection Time: 09/07/18  8:18 AM  Result Value Ref Range Status   Specimen Description   Final    BLOOD RIGHT ANTECUBITAL Performed at [facility] [address], [place], [place] [address]    Special Requests   Final    BOTTLES DRAWN AEROBIC AND ANAEROBIC Blood Culture adequate volume Performed at [facility] [address] [place], [place] [address]    Culture   Final    NO GROWTH 5 DAYS Performed at [facility], [place] [address], [place], [place] [address]    Report Status 09/12/2018 FINAL  Final  Urine culture     Status: None   Collection Time: 09/07/18  8:22 AM  Result Value Ref Range Status   Specimen Description    Final    URINE, CLEAN CATCH Performed at [facility], [place] [address], [place], [place] [address]  Special Requests   Final    NONE Performed at [facility], [place] [address] [place], [place] [address]    Culture   Final    NO GROWTH Performed at [facility], [place] [address], [place], [place] [address]    Report Status 09/08/2018 FINAL  Final      Studies:  Dg Swallowing Func-speech Pathology  Result Date: 09/15/2018 Objective Swallowing Evaluation: Type of Study: MBS-Modified Barium Swallow Study  Patient Details Name: ROMANDA TURRUBIATES MRN: [id number] Date of Birth: 1946-08-01 Today's Date: 09/15/2018 Time: SLP Start Time (ACUTE ONLY): 1210 -SLP Stop Time (ACUTE ONLY): 1240 SLP Time Calculation (min) (ACUTE ONLY): 30 min Past Medical History: Past Medical History: Diagnosis Date . Arthritis  . Hiatal hernia  . History of chicken pox  . Hyperlipidemia  . Pacemaker  Past Surgical History: Past Surgical History: Procedure Laterality Date . ANKLE SURGERY   . PACEMAKER IMPLANT N/A 04/18/2018  Procedure: PACEMAKER IMPLANT;  Surgeon: Evans Lance, MD;  Location: [facility];  Service: Cardiovascular;  Laterality: N/A; HPI: DONYETTA OGLETREE is a 72 y.o. 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  During her hospitalization she underwent left axillary lymph node biopsy.  She was discharged home in a stable condition, but apparently she has been developing worsening weakness, generalized, worse with exertion, no improving factors, associated with malaise, chills and poor appetite.  Over the last 24 hours her symptoms have been more severe to the point where she has difficulty standing. She has persistent left upper extremity edema which has been painful, sharp in nature up to 10 out of 10 intensity.  Her  weakness has led to dyspnea which has been severe in intensity, and triggered her emergency room visit today.Pt was made NPO yesterday due to concerns for aspiration.   Today pt is alert, has congested weak cough concerrning for airway protection.   Pt underwent MBS yesterday and was placed on full liquid diet with precautions. RN reports pt continues to cough with po intake.  Per note from MD, now plan is for pt to have small bore feeding tube placed for nutritional support.  Unfortunately pt did not tolerate attempt at insertion of feeding tube.  Repeat swallow evaluation ordered.  SLP follow up to inform family/pt of plan and determine if clinically pt appears improved.    Subjective: Alert Assessment / Plan / Recommendation CHL IP CLINICAL IMPRESSIONS 09/15/2018 Clinical Impression Patient had a MBS on 09/12/18 and patient presented with Moderately severe oropharyngeal dysphagia with sensorimotor deficits. She presents much stronger today and presents with a mild oropharyngeal dysphagia. Oral preparation and bolus transfer was mild evidenced by trace oral residue. Patient initiated swallow at the level of the pyriform sinuses with no penetration or aspiration with cup sips of all sizes. She did have penetration with cup sip with straw, but spontaneously cleared layngeal vestibule. She initiated swallow at the valleculae with puree and cracker bolus, no penetration or aspiration. Significant pharyngeal residue present after 1st swallow, but improved mostly cleared with second spontaneous swallow. Upper 1/3 of esphagus observed to clear well with puree and tablet. Due to significant sensorimotor improvement, recommend patient have regular diet with thin liquids, medication whole in puree. Speech therapy to follow up for continue diet tolerance and swallowing therapy. SLP Visit Diagnosis Dysphagia, oropharyngeal phase (R13.12) Attention and concentration  deficit following -- Frontal lobe and executive function  deficit following -- Impact on safety and function Mild aspiration risk   CHL IP TREATMENT RECOMMENDATION 09/15/2018 Treatment Recommendations Therapy as outlined in treatment plan below   Prognosis 09/15/2018 Prognosis for Safe Diet Advancement Good Barriers to Reach Goals -- Barriers/Prognosis Comment -- CHL IP DIET RECOMMENDATION 09/15/2018 SLP Diet Recommendations Regular solids;Thin liquid Liquid Administration via Cup;No straw Medication Administration Whole meds with liquid Compensations Slow rate;Small sips/bites;Other (Comment) Postural Changes Remain semi-upright after after feeds/meals (Comment);Seated upright at 90 degrees   CHL IP OTHER RECOMMENDATIONS 09/15/2018 Recommended Consults -- Oral Care Recommendations Oral care before and after PO Other Recommendations --   CHL IP FOLLOW UP RECOMMENDATIONS 09/14/2018 Follow up Recommendations (No Data)   CHL IP FREQUENCY AND DURATION 09/15/2018 Speech Therapy Frequency (ACUTE ONLY) min 2x/week Treatment Duration 1 week      CHL IP ORAL PHASE 09/15/2018 Oral Phase Impaired Oral - Pudding Teaspoon -- Oral - Pudding Cup Lingual/palatal residue Oral - Honey Teaspoon -- Oral - Honey Cup -- Oral - Nectar Teaspoon NT Oral - Nectar Cup NT Oral - Nectar Straw NT Oral - Thin Teaspoon Lingual/palatal residue Oral - Thin Cup Lingual/palatal residue;Impaired mastication Oral - Thin Straw Lingual/palatal residue Oral - Puree Lingual/palatal residue Oral - Mech Soft Lingual/palatal residue Oral - Regular -- Oral - Multi-Consistency -- Oral - Pill -- Oral Phase - Comment --  CHL IP PHARYNGEAL PHASE 09/15/2018 Pharyngeal Phase Impaired Pharyngeal- Pudding Teaspoon Delayed swallow initiation-vallecula;Pharyngeal residue - valleculae;Pharyngeal residue - pyriform Pharyngeal -- Pharyngeal- Pudding Cup NT Pharyngeal -- Pharyngeal- Honey Teaspoon -- Pharyngeal -- Pharyngeal- Honey Cup -- Pharyngeal -- Pharyngeal- Nectar Teaspoon NT Pharyngeal -- Pharyngeal- Nectar Cup NT  Pharyngeal -- Pharyngeal- Nectar Straw NT Pharyngeal -- Pharyngeal- Thin Teaspoon Delayed swallow initiation-pyriform sinuses;Reduced pharyngeal peristalsis;Reduced anterior laryngeal mobility;Reduced laryngeal elevation;Reduced airway/laryngeal closure;Reduced tongue base retraction;Pharyngeal residue - valleculae;Pharyngeal residue - pyriform Pharyngeal -- Pharyngeal- Thin Cup Delayed swallow initiation-pyriform sinuses;Reduced anterior laryngeal mobility;Reduced laryngeal elevation;Reduced airway/laryngeal closure;Reduced tongue base retraction;Pharyngeal residue - valleculae;Pharyngeal residue - pyriform;Reduced pharyngeal peristalsis Pharyngeal -- Pharyngeal- Thin Straw Delayed swallow initiation-pyriform sinuses;Reduced anterior laryngeal mobility;Reduced laryngeal elevation;Reduced airway/laryngeal closure;Reduced pharyngeal peristalsis;Reduced tongue base retraction;Pharyngeal residue - valleculae;Pharyngeal residue - pyriform;Penetration/Aspiration during swallow Pharyngeal -- Pharyngeal- Puree Delayed swallow initiation-vallecula;Reduced laryngeal elevation;Reduced airway/laryngeal closure;Reduced tongue base retraction;Pharyngeal residue - valleculae Pharyngeal -- Pharyngeal- Mechanical Soft Delayed swallow initiation-vallecula;Reduced laryngeal elevation;Reduced airway/laryngeal closure;Reduced tongue base retraction Pharyngeal -- Pharyngeal- Regular NT Pharyngeal -- Pharyngeal- Multi-consistency -- Pharyngeal -- Pharyngeal- Pill Delayed swallow initiation-vallecula;Reduced laryngeal elevation;Reduced airway/laryngeal closure Pharyngeal -- Pharyngeal Comment --  CHL IP CERVICAL ESOPHAGEAL PHASE 09/15/2018 Cervical Esophageal Phase WFL Pudding Teaspoon -- Pudding Cup -- Honey Teaspoon -- Honey Cup -- Nectar Teaspoon -- Nectar Cup -- Nectar Straw -- Thin Teaspoon -- Thin Cup -- Thin Straw -- Puree -- Mechanical Soft -- Regular -- Multi-consistency -- Pill -- Cervical Esophageal Comment -- Charlynne Cousins Ward,  MA, CCC-SLP 09/15/2018 2:59 PM               Assessment: 72 y.o. with newly diagnosed high-grade B-cell lymphoma, recently discharged from hospital after biopsy, presented to the emergency room with progressive weakness, dyspnea, chills and fever, anorexia, and extremity edema.  1. Sepsis, with fever, chill, probable aspiration pneumonia, improving, off antibiotics now  2.  Acute hypoxic respite failure, secondary to lymphoma in lungs, versus infections, improving  3.  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 Worsening anemia secondary to chemo, GI bleeding  6.  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 Hypercalcemia secondary to malignancy, s/p zometa 12/5, resolved now  8. Severe arm and leg edema, secondary to severe adenopathy and hypoalbuminemia, improving  9. Deconditioning  10 left ptosis and dilated right pupil  11.  thrombocytopenia, secondary to lymphoma and chemotherapy  Plan:  -Her H/H remains to be low despite frequent blood transfusion, likely secondary to chemo, some GI bleeding, and hemolysis. Hg 6.7 this morning, she will receive blood transfusion today -Continue supportive care with blood transfusion to keep hemoglobin above 7.5, platelet transfusion if platelet less than 20 K or active bleeding -Will continue prednisone 40mg  daily for now  -I warned her that her WBC is dropping, if ANC<1.0, please start her on granix 485mcg injection daily  -If she has overt GI bleeding, please consult GI  -I encouraged her to participate physical therapy, we need to think about her discharge position, home vs SNF. If her blood counts stable for 2-3 days, she may be able to be discharged early next week. -I will return on Monday, please  call the on-call physician if needed over the weekend.   Truitt Merle  09/16/2018

## 2018-09-16 NOTE — Progress Notes (Signed)
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 She was discharged home in a stable condition, but apparently she has been developing worsening weakness, generalized, worse with exertion, no improving factors, associated with malaise, chills and poor appetite. Over the last 24 hours her symptoms have been more severe to the point where she has difficulty standing. She has persistent left upper extremity edema which has been painful, sharp in nature up to 10 out of 10 intensity. Her weakness has led to dyspnea which has been severe in intensity,and triggered her emergency room visit today.  ED Course:Patient was found ill looking appearing, febrile, positive leukocytosis, concern for systemic infection, referred for admission for evaluation.  **Oncology concerned about her progression of her high-grade B-cell lymphoma and is planning on starting chemotherapy today and have ordered a PICC line placement which was done by IR Dr. Annamaria Boots.  Sepsis physiology is improved.  Patient still remains hypercalcemic so oncology gave the patient Zometa and continued with IV fluidsbut have now discontinued. She started her Rituxan the day before yesterday but did not complete it because she had some mild infusion reactions.  The completion of her chemotherapy yesterday and this morning she is doing well but then she started coughing with her medication became slightly altered and confused after pain medication.  She is made n.p.o. and SLP evaluated and did an MBS which showed patient is aspirating.  SLP recommended full liquid diet with known  aspiration risks and will discuss with the family about pain to tube and tube feedings.  Patient's hemoglobin dropped so we will give another unit of PRBCs and will also give another dose of IV Lasix.  Will change antibiotics to IV Unasyn.    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 Likely related to progressive high-grade B-cell lymphoma with pulmonary involvement comp located by suspected aspiration - ?? Etiology.  Likely secondary to progressive high-grade B-cell lymphoma with pulmonary involvement.  There was also concern for suspected aspiration pneumonia.  -IVF discontinued and received Lasix -IV antibiotic therapy withIVceftriaxone and IV Metronidazole was started.  Patient status post IV Rocephin.  IV Flagyl discontinued.  Patient was on IV Unasyn.  Repeat chest x-ray negative for any acute infiltrate however concerning for persistent bilateral pleural effusions.  -Discontinued IV Unasyn after 09/14/2018's dose as patient s/p 8 days of antibiotic treatment. -Patient seen by speech therapy and noted to be high aspiration risk and patient started on a full liquid diet.   -Panda was attempted per RN however patient unable to tolerate at this time.   -Chest x-ray done and showed stable chest x-ray demonstrating pulmonary lymphoma and bilateral pleural effusions. -Patient received doses of IV Lasix yesterday in between blood transfusions with a urine output of 3.050 L.   -Patient underwent repeat modified barium swallow with good results.  Patient currently on a regular diet with aspiration precautions.  -  Follow.   High-grade B-cell Lymphoma -CT head unremarkable.   -Patient with left upper  extremity pain and swelling likely secondary to high-grade B-cell lymphoma.  Left upper extremity swelling slowly improving. -Oncology consulted and patient started on R-CHOP and patient to got Rituxan and CHOP on different days -Oncology Dr. Burr Medico does not recommend a bone marrow biopsy as this would not change restaging imaging and management -Dr. Burr Medico has ordered [facility] to rule out double hit or triple hit high-grade lymphoma -PICC placed. -Solumedrol started for Hemolysis and now on po Prednisone -Rituxan was initiated, but unable to complete due to infusion reactions so will get repeat treatment with Benadryl, Tylenol and Steroids and remainder of Rituximab and CHOP received chemotherapy on 09/10/18. -Pain Control with Acetaminophen, Hydrocodone-Acetaminophen 1 tab po q6hprn, and IV Morphine 2 mg q2hprn -With her Anasarca from Cancer she is going to be given IV Lasix from Oncology and IV fluids have now been stopped; patient status post packed red blood cells.   -Due to low albumin levels patient given IV albumin for the past 24 hours.  -Per oncology.   Acute encephalopathy likely in the setting of hypercalcemia, Hypercalcemia is improving. -Improving clinically.  -Head CT noncontrast showed no evidence of any acute intracranial abnormality.  There is mild chronic small vessel ischemic changes in the cerebral white matter.  There is also mild chronic appearing paranasal sinusitis. -Oncology recommended MRI to rule out CNS lymphoma however unable to be done as history of pacemaker placement.  Also concerns for aspiration pneumonia.  -Patient seen by speech therapy and patient underwent MBS which showed high risk of aspiration.  Patient currently on full liquid diet.   -Panda tube ordered however patient unable to tolerate at this time.   -Patient underwent repeat modified barium swallow with good results.  Patient currently on regular diet with aspiration precautions.  -Continue monitor her  mental status -Per oncology if worsening mental status may need an LP to rule out CNS lymphoma.  Hypercalcemia likely of Malignancy -Calcium level was  9.2 today and corrected calcium likely higher than that due to albumin level -Patient was given zoledronic acid IV 4 mg once 09/08/2018 per oncology -Continue to Monitor   Iron deficiency anemia/Hemolysis from [place] -Patient's hemoglobin/hematocrit went from 8.2/27.5 -> 6.9/24.2>>> 7.1>>>> 6.7>>>> 7.4>>>> 7.6>>>> 6.7. -Patient status post transfusion of 6 units packed red blood cells.   -Hemoglobin is 6.7 this morning after 7.4 the morning of 09/15/2018, after transfusion of 2 more units of packed red blood cells on 09/14/2018..   -Patient denies any overt bleeding.   -FOBT was positive.   -Per oncology secondary to chemotherapy and hemolysis.  LDH trending down.  Bilirubin trending down.  Repeat H&H this afternoon and transfusion threshold hemoglobin less than 7.5. -Total bilirubin at 1.2.  Last LDH was 176 from 309.  LDH trending down. -Patient with no overt signs of bleeding. -Patient on steroids per oncology. -Continue oral iron supplementation. - Follow. -Oncology following.  Third-degree AV block s/p pacemaker -Patient currently has a V paced rhythm.  Obesity -Estimated body mass index is 30.85 kg/m as calculated from the following:   Height as of this encounter: _0  (1.676 m).   Weight as of this encounter: 86.7 kg. -Weight Loss Counseling given  Non-Severe (moderate) malnutrition in the context of acute illness/injury -Nutrition is consulted for further evaluation recommendations -Continue with Ensure Enlive p.o. twice daily -Panda to was attempted early on today however unsuccessful as patient unable to tolerate tube  placement.   -Repeat modified barium swallow pending today. -Patient was placed on IV albumin for 24 hours.  Repeat IV albumin for another 24 hours.  Follow.  Left Arm Weakness and  Swelling -Likely secondary to high-grade lymphoma.   -Patient status post upper extremity Dopplers done 08/29/2018 which were negative for DVT.   -CT angiogram chest done 2019 with enlargement of left axillary, subpectoral chest wall lymph node masses with encasement of left subclavian and axillary vascular structures.  Enlargement of right hilar and mediastinal lymph nodes since prior study.  Normal arterial patency demonstrated of the left subclavian and axillary arteries.  Swelling improved over the past 24 hours with diuretics and IV albumin.  Patient still with edema.  Patient receiving IV albumin.  Patient's hemoglobin is 6.7 and patient being transfused packed red blood cells.  We will give IV Lasix prior to and in between PRBC transfusions.  Pupil Discrepancy -Noted to be new -No Vision loss -Head CT Negative -Continue to Monitor   Thrombocytopenia -In the setting of Chemotherapy; Platelet Count is now 81 -Patient with no signs of bleeding.   -Lovenox discontinued.   -Per oncology transfuse for platelet count less than 20.   Elevated BUN -In the setting of Steroids -BUN trending back down.  Follow.  -Continue PPI.  Leukocytosis/neutropenia -Likely reactive.  Leukocytosis trended down.  WBC now at 2.8.  -Patient was on IV antibiotics presumably for possible aspiration pneumonia.   -Discontinued IV antibiotics on 09/15/2018.   -Per oncology if [place] less than 1 we will start Granix.  Hypernatremia -Improved on gentle hydration with D5W.  Discontinued D5W.  Follow.     DVT prophylaxis: SCDs Code Status: Full Family Communication: Updated patient and daughter at bedside. Disposition Plan: Pending work-up.   Consultants:   Hematology/oncology Dr. Burr Medico  Procedures:   CT head 09/07/2018  CT angiogram chest 09/07/2018  Chest x-ray 09/07/2018, 09/09/2018, 09/11/2018, 09/14/2018  PICC line  R- CHOP   Repeat modified barium swallow 09/15/2018  Antimicrobials:    Unasyn 09/12/2018>>>>> 09/15/2018  IV Rocephin 09/07/2018>>>>> 09/12/2018  IV Flagyl 09/07/2018>>>>> 09/08/2018  Status post 4 units packed red blood cells.  2 units packed red blood cells 09/14/2018  2 units packed red blood cells 09/16/2018   Subjective: Patient sitting up in bed.  Patient states she is feeling better.  Patient denies any chest pain or shortness of breath.  Tolerating current diet.  Patient states had a coughing spell overnight.  Patient afebrile.  Patient denies any overt bleeding.  Objective: Vitals:   09/15/18 2159 09/16/18 0616 09/16/18 0843 09/16/18 0918  BP: (!) 146/67 (!) 166/64 (!) 153/65 (!) 184/83  Pulse: 70 69 73 79  Resp: 20 20 (!) 22 (!) 21  Temp: 98 F (36.7 C) 98 F (36.7 C) 97.9 F (36.6 C) 98.4 F (36.9 C)  TempSrc: Axillary Axillary Oral Oral  SpO2: 100% 100% 100% 100%  Weight:      Height:        Intake/Output Summary (Last 24 hours) at 09/16/2018 1150 Last data filed at 09/16/2018 0949 Gross per 24 hour  Intake 768.14 ml  Output 1750 ml  Net -981.86 ml   Filed Weights   09/08/18 0505 09/09/18 0500 09/13/18 0631  Weight: 89.3 kg 87.8 kg 86.7 kg    Examination:  General exam: NAD Eyes: Unequal pupil sizes. Respiratory system: Bibasilar crackles.  No wheezing.  Normal respiratory effort.  Speaking in full sentences.  No use of accessory muscles of respiration. Respiratory  effort normal. Cardiovascular system: RRR no murmurs rubs or gallops.  No JVD.  Bilateral upper extremity edema left greater than right.  1-2+ bilateral lower extremity edema.  Gastrointestinal system: Abdomen is nontender, nondistended, soft, positive bowel sounds.  No rebound.  No guarding.  Central nervous system: Alert and oriented. No focal neurological deficits. Extremities: Symmetric 5 x 5 power. Skin: No rashes, lesions or ulcers Psychiatry: Judgement and insight appear normal. Mood & affect appropriate.     Data Reviewed: I have personally  reviewed following labs and imaging studies  CBC: Recent Labs  Lab 09/10/18 0500 09/11/18 0430 09/12/18 0615 09/13/18 1133 09/14/18 0449 09/14/18 1300 09/15/18 0555 09/15/18 1320 09/16/18 0500  WBC 12.0* 12.8* 10.5 14.0* 8.1  --  4.0  --  2.8*  NEUTROABS 11.0* 11.8* 10.1*  --  7.9*  --   --   --  2.6  HGB 8.9* 8.5* 7.3* 7.7* 7.1* 6.7* 7.4* 7.6* 6.7*  HCT 30.1* 29.0* 25.1* 25.0* 22.8* 21.7* 23.2* 23.7* 21.3*  MCV 93.2 94.5 93.7 93.3 93.4  --  91.3  --  91.0  PLT 120* 91* 66* 85* 56*  --  49*  --  44*   Basic Metabolic Panel: Recent Labs  Lab 09/10/18 0500 09/11/18 0430 09/12/18 0615 09/13/18 1133 09/14/18 0449 09/15/18 0555 09/16/18 0500  NA 137 137 139 140 148* 143 141  K 4.5 4.9 5.0 4.7 3.8 3.9 3.9  CL 103 105 108 110 116* 111 108  CO2 _0 21* 21* 26 28  GLUCOSE 101* 102* 115* 135* 105* 93 94  BUN 47* 66* 91* 100* 74* 48* 32*  CREATININE 0.94 1.02* 0.88 0.82 0.79 0.55 0.44  CALCIUM 12.0* 11.1* 10.3 9.7 8.4* 9.1 9.2  MG 2.0 2.1 2.6* 2.7* 2.3  --   --   PHOS 3.3 4.5 5.9* 5.7* 4.4  --   --    GFR: Estimated Creatinine Clearance: 71.6 mL/min (by C-G formula based on SCr of 0.44 mg/dL). Liver Function Tests: Recent Labs  Lab 09/12/18 0615 09/13/18 1133 09/14/18 0449 09/15/18 0555 09/16/18 0500  AST _1 ALT _2 ALKPHOS 60 59 50 42 43  BILITOT 1.4* 1.3* 0.9 1.3* 1.2  PROT 4.0* 4.0* 3.6* 4.3* 5.0*  ALBUMIN 2.3* 2.5* 2.2* 2.9* 3.7   No results for input(s): LIPASE, AMYLASE in the last 168 hours. No results for input(s): AMMONIA in the last 168 hours. Coagulation Profile: No results for input(s): INR, PROTIME in the last 168 hours. Cardiac Enzymes: No results for input(s): CKTOTAL, CKMB, CKMBINDEX, TROPONINI in the last 168 hours. BNP (last 3 results) No results for input(s): PROBNP in the last 8760 hours. HbA1C: No results for input(s): HGBA1C in the last 72 hours. CBG: Recent Labs  Lab 09/16/18 0737  GLUCAP 84   Lipid  Profile: No results for input(s): CHOL, HDL, LDLCALC, TRIG, CHOLHDL, LDLDIRECT in the last 72 hours. Thyroid Function Tests: No results for input(s): TSH, T4TOTAL, FREET4, T3FREE, THYROIDAB in the last 72 hours. Anemia Panel: Recent Labs    09/16/18 0500  RETICCTPCT 0.3*   Sepsis Labs: No results for input(s): PROCALCITON, LATICACIDVEN in the last 168 hours.  Recent Results (from the past 240 hour(s))  Culture, blood (Routine x 2)     Status: None   Collection Time: 09/07/18  8:07 AM  Result Value Ref Range Status   Specimen Description   Final    BLOOD LEFT HAND Performed at  [facility], [place] [address] [place], [place] [address]    Special Requests   Final    Blood Culture adequate volume BOTTLES DRAWN AEROBIC AND ANAEROBIC   Culture   Final    NO GROWTH 5 DAYS Performed at [facility], [place] [address], [place], [place] [address]    Report Status 09/12/2018 FINAL  Final  Culture, blood (Routine x 2)     Status: None   Collection Time: 09/07/18  8:18 AM  Result Value Ref Range Status   Specimen Description   Final    BLOOD RIGHT ANTECUBITAL Performed at [place] [address] [place], [place] [address]    Special Requests   Final    BOTTLES DRAWN AEROBIC AND ANAEROBIC Blood Culture adequate volume Performed at [place] [address], [place], [place] [address]    Culture   Final    NO GROWTH 5 DAYS Performed at [facility], [place] [address], [place], [place] [address]    Report Status 09/12/2018 FINAL  Final  Urine culture     Status: None   Collection Time: 09/07/18  8:22 AM  Result Value Ref Range Status   Specimen Description   Final    URINE, CLEAN CATCH Performed at [facility], [place] [address] [place], [place] [address]    Special Requests   Final    NONE Performed at [facility], [place] [address] [place] [employer] [place], [place] [address]    Culture    Final    NO GROWTH Performed at [facility], [place] [address] [place], [place] [address]    Report Status 09/08/2018 FINAL  Final         Radiology Studies: Dg Swallowing Func-speech Pathology  Result Date: 09/15/2018 Objective Swallowing Evaluation: Type of Study: MBS-Modified Barium Swallow Study  Patient Details Name: CHELSEY REDONDO MRN: [id number] Date of Birth: [birth year] Today's Date: 09/15/2018 Time: SLP Start Time (ACUTE ONLY): 1210 -SLP Stop Time (ACUTE ONLY): 1240 SLP Time Calculation (min) (ACUTE ONLY): 30 min Past Medical History: Past Medical History: Diagnosis Date . Arthritis  . Hiatal hernia  . History of chicken pox  . Hyperlipidemia  . Pacemaker  Past Surgical History: Past Surgical History: Procedure Laterality Date . ANKLE SURGERY   . PACEMAKER IMPLANT N/A 04/18/2018  Procedure: PACEMAKER IMPLANT;  Surgeon: Evans Lance, MD;  Location: [facility];  Service: Cardiovascular;  Laterality: N/A; HPI: MARTAVIA TYE is a 72 y.o. 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  During her hospitalization she underwent left axillary lymph node biopsy.  She was discharged home in a stable condition, but apparently she has been developing worsening weakness, generalized, worse with exertion, no improving factors, associated with malaise, chills and poor appetite.  Over the last 24 hours her symptoms have been more severe to the point where she has difficulty standing. She has persistent left upper extremity edema which has been painful, sharp in nature up to 10 out of 10 intensity.  Her weakness has led to dyspnea which has been severe in intensity, and triggered her emergency room visit today.Pt was made NPO yesterday due to concerns for aspiration.   Today pt is alert, has congested weak cough concerrning for airway protection.   Pt  underwent MBS yesterday and was  placed on full liquid diet with precautions. RN reports pt continues to cough with po intake.  Per note from MD, now plan is for pt to have small bore feeding tube placed for nutritional support.  Unfortunately pt did not tolerate attempt at insertion of feeding tube.  Repeat swallow evaluation ordered.  SLP follow up to inform family/pt of plan and determine if clinically pt appears improved.    Subjective: Alert Assessment / Plan / Recommendation CHL IP CLINICAL IMPRESSIONS 09/15/2018 Clinical Impression Patient had a MBS on 09/12/18 and patient presented with Moderately severe oropharyngeal dysphagia with sensorimotor deficits. She presents much stronger today and presents with a mild oropharyngeal dysphagia. Oral preparation and bolus transfer was mild evidenced by trace oral residue. Patient initiated swallow at the level of the pyriform sinuses with no penetration or aspiration with cup sips of all sizes. She did have penetration with cup sip with straw, but spontaneously cleared layngeal vestibule. She initiated swallow at the valleculae with puree and cracker bolus, no penetration or aspiration. Significant pharyngeal residue present after 1st swallow, but improved mostly cleared with second spontaneous swallow. Upper 1/3 of esphagus observed to clear well with puree and tablet. Due to significant sensorimotor improvement, recommend patient have regular diet with thin liquids, medication whole in puree. Speech therapy to follow up for continue diet tolerance and swallowing therapy. SLP Visit Diagnosis Dysphagia, oropharyngeal phase (R13.12) Attention and concentration deficit following -- Frontal lobe and executive function deficit following -- Impact on safety and function Mild aspiration risk   CHL IP TREATMENT RECOMMENDATION 09/15/2018 Treatment Recommendations Therapy as outlined in treatment plan below   Prognosis 09/15/2018 Prognosis for Safe Diet Advancement Good  Barriers to Reach Goals -- Barriers/Prognosis Comment -- CHL IP DIET RECOMMENDATION 09/15/2018 SLP Diet Recommendations Regular solids;Thin liquid Liquid Administration via Cup;No straw Medication Administration Whole meds with liquid Compensations Slow rate;Small sips/bites;Other (Comment) Postural Changes Remain semi-upright after after feeds/meals (Comment);Seated upright at 90 degrees   CHL IP OTHER RECOMMENDATIONS 09/15/2018 Recommended Consults -- Oral Care Recommendations Oral care before and after PO Other Recommendations --   CHL IP FOLLOW UP RECOMMENDATIONS 09/14/2018 Follow up Recommendations (No Data)   CHL IP FREQUENCY AND DURATION 09/15/2018 Speech Therapy Frequency (ACUTE ONLY) min 2x/week Treatment Duration 1 week      CHL IP ORAL PHASE 09/15/2018 Oral Phase Impaired Oral - Pudding Teaspoon -- Oral - Pudding Cup Lingual/palatal residue Oral - Honey Teaspoon -- Oral - Honey Cup -- Oral - Nectar Teaspoon NT Oral - Nectar Cup NT Oral - Nectar Straw NT Oral - Thin Teaspoon Lingual/palatal residue Oral - Thin Cup Lingual/palatal residue;Impaired mastication Oral - Thin Straw Lingual/palatal residue Oral - Puree Lingual/palatal residue Oral - Mech Soft Lingual/palatal residue Oral - Regular -- Oral - Multi-Consistency -- Oral - Pill -- Oral Phase - Comment --  CHL IP PHARYNGEAL PHASE 09/15/2018 Pharyngeal Phase Impaired Pharyngeal- Pudding Teaspoon Delayed swallow initiation-vallecula;Pharyngeal residue - valleculae;Pharyngeal residue - pyriform Pharyngeal -- Pharyngeal- Pudding Cup NT Pharyngeal -- Pharyngeal- Honey Teaspoon -- Pharyngeal -- Pharyngeal- Honey Cup -- Pharyngeal -- Pharyngeal- Nectar Teaspoon NT Pharyngeal -- Pharyngeal- Nectar Cup NT Pharyngeal -- Pharyngeal- Nectar Straw NT Pharyngeal -- Pharyngeal- Thin Teaspoon Delayed swallow initiation-pyriform sinuses;Reduced pharyngeal peristalsis;Reduced anterior laryngeal mobility;Reduced laryngeal elevation;Reduced airway/laryngeal  closure;Reduced tongue base retraction;Pharyngeal residue - valleculae;Pharyngeal residue - pyriform Pharyngeal -- Pharyngeal- Thin Cup Delayed swallow initiation-pyriform sinuses;Reduced anterior laryngeal mobility;Reduced laryngeal elevation;Reduced airway/laryngeal closure;Reduced tongue base retraction;Pharyngeal residue - valleculae;Pharyngeal residue - pyriform;Reduced pharyngeal peristalsis Pharyngeal --  Pharyngeal- Thin Straw Delayed swallow initiation-pyriform sinuses;Reduced anterior laryngeal mobility;Reduced laryngeal elevation;Reduced airway/laryngeal closure;Reduced pharyngeal peristalsis;Reduced tongue base retraction;Pharyngeal residue - valleculae;Pharyngeal residue - pyriform;Penetration/Aspiration during swallow Pharyngeal -- Pharyngeal- Puree Delayed swallow initiation-vallecula;Reduced laryngeal elevation;Reduced airway/laryngeal closure;Reduced tongue base retraction;Pharyngeal residue - valleculae Pharyngeal -- Pharyngeal- Mechanical Soft Delayed swallow initiation-vallecula;Reduced laryngeal elevation;Reduced airway/laryngeal closure;Reduced tongue base retraction Pharyngeal -- Pharyngeal- Regular NT Pharyngeal -- Pharyngeal- Multi-consistency -- Pharyngeal -- Pharyngeal- Pill Delayed swallow initiation-vallecula;Reduced laryngeal elevation;Reduced airway/laryngeal closure Pharyngeal -- Pharyngeal Comment --  CHL IP CERVICAL ESOPHAGEAL PHASE 09/15/2018 Cervical Esophageal Phase WFL Pudding Teaspoon -- Pudding Cup -- Honey Teaspoon -- Honey Cup -- Nectar Teaspoon -- Nectar Cup -- Nectar Straw -- Thin Teaspoon -- Thin Cup -- Thin Straw -- Puree -- Mechanical Soft -- Regular -- Multi-consistency -- Pill -- Cervical Esophageal Comment -- Charlynne Cousins Ward, MA, CCC-SLP 09/15/2018 2:59 PM                   Scheduled Meds: . sodium chloride   Intravenous Once  . sodium chloride   Intravenous Once  . acetaminophen  650 mg Oral Once  . acyclovir  400 mg Oral BID  . allopurinol  300 mg Oral  Daily  . cholecalciferol  2,000 Units Oral Daily  . diphenhydrAMINE  25 mg Oral Once  . feeding supplement (ENSURE ENLIVE)  237 mL Oral BID BM  . folic acid  1 mg Oral Daily  . furosemide  20 mg Intravenous Once  . furosemide  20 mg Intravenous Once  . mouth rinse  15 mL Mouth Rinse BID  . mupirocin ointment   Topical BID  . pantoprazole (PROTONIX) IV  40 mg Intravenous Q12H  . polyethylene glycol  17 g Oral Daily  . predniSONE  40 mg Oral Q breakfast  . senna-docusate  1 tablet Oral BID   Continuous Infusions: . sodium chloride Stopped (09/12/18 1805)  . sodium chloride       LOS: 9 days    Time spent: 40 mins    Irine Seal, MD [facility] Pager [phone]  If 7PM-7AM, please contact night-coverage [email or web address] Password [email or web address] 09/16/2018, 11:50 AM

## 2018-09-16 NOTE — Care Management Important Message (Signed)
Important Message  Patient Details  Name: Patricia Murillo MRN: [id number] Date of Birth: 20-Aug-1946   Medicare Important Message Given:  Yes    Kerin Salen 09/16/2018, 10:26 AMImportant Message  Patient Details  Name: Patricia Murillo MRN: [id number] Date of Birth: 03/31/1946   Medicare Important Message Given:  Yes    Kerin Salen 09/16/2018, 10:26 AM

## 2018-09-17 ENCOUNTER — Inpatient Hospital Stay (HOSPITAL_COMMUNITY): Payer: Medicare HMO

## 2018-09-17 LAB — CBC WITH DIFFERENTIAL/PLATELET
Abs Immature Granulocytes: 0.11 10*3/uL — ABNORMAL HIGH (ref 0.00–0.07)
Basophils Absolute: 0 10*3/uL (ref 0.0–0.1)
Basophils Relative: 1 %
Eosinophils Absolute: 0 10*3/uL (ref 0.0–0.5)
Eosinophils Relative: 1 %
HCT: 26.7 % — ABNORMAL LOW (ref 36.0–46.0)
HEMOGLOBIN: 8.7 g/dL — AB (ref 12.0–15.0)
Immature Granulocytes: 7 %
Lymphocytes Relative: 2 %
Lymphs Abs: 0 10*3/uL — ABNORMAL LOW (ref 0.7–4.0)
MCH: 28.7 pg (ref 26.0–34.0)
MCHC: 32.6 g/dL (ref 30.0–36.0)
MCV: 88.1 fL (ref 80.0–100.0)
Monocytes Absolute: 0 10*3/uL — ABNORMAL LOW (ref 0.1–1.0)
Monocytes Relative: 1 %
Neutro Abs: 1.4 10*3/uL — ABNORMAL LOW (ref 1.7–7.7)
Neutrophils Relative %: 88 %
Platelets: 54 10*3/uL — ABNORMAL LOW (ref 150–400)
RBC: 3.03 MIL/uL — ABNORMAL LOW (ref 3.87–5.11)
RDW: 16.3 % — ABNORMAL HIGH (ref 11.5–15.5)
WBC: 1.6 10*3/uL — ABNORMAL LOW (ref 4.0–10.5)
nRBC: 0 % (ref 0.0–0.2)

## 2018-09-17 LAB — BASIC METABOLIC PANEL
Anion gap: 9 (ref 5–15)
BUN: 23 mg/dL (ref 8–23)
CO2: 29 mmol/L (ref 22–32)
CREATININE: 0.41 mg/dL — AB (ref 0.44–1.00)
Calcium: 9.1 mg/dL (ref 8.9–10.3)
Chloride: 104 mmol/L (ref 98–111)
GFR calc Af Amer: >60 mL/min (ref >60)
GFR calc non Af Amer: >60 mL/min (ref >60)
Glucose, Bld: 94 mg/dL (ref 70–99)
Potassium: 3.5 mmol/L (ref 3.5–5.1)
Sodium: 142 mmol/L (ref 135–145)

## 2018-09-17 LAB — BPAM RBC
Blood Product Expiration Date: 202001052359
Blood Product Expiration Date: 202001072359
ISSUE DATE / TIME: 201912131645
ISSUE DATE / TIME: 201912132327
Unit Type and Rh: 5100
Unit Type and Rh: 5100

## 2018-09-17 LAB — FERRITIN: Ferritin: 1572 ng/mL — ABNORMAL HIGH (ref 11–307)

## 2018-09-17 LAB — IRON AND TIBC
Iron: 39 ug/dL (ref 28–170)
Saturation Ratios: 17 % (ref 10.4–31.8)
TIBC: 225 ug/dL — ABNORMAL LOW (ref 250–450)
UIBC: 186 ug/dL

## 2018-09-17 LAB — TYPE AND SCREEN
ABO/RH(D): O POS
Antibody Screen: NEGATIVE
Unit division: 0
Unit division: 0

## 2018-09-17 MED ORDER — FUROSEMIDE 10 MG/ML IJ SOLN
40.0000 mg | Freq: Two times a day (BID) | INTRAMUSCULAR | Status: AC
  Administered 2018-09-17 – 2018-09-18 (×4): 40 mg via INTRAVENOUS
  Filled 2018-09-17 (×4): qty 4

## 2018-09-17 MED ORDER — FAMOTIDINE 20 MG PO TABS
20.0000 mg | ORAL_TABLET | Freq: Every day | ORAL | Status: DC
  Administered 2018-09-17 – 2018-10-05 (×20): 20 mg via ORAL
  Filled 2018-09-17 (×19): qty 1

## 2018-09-17 MED ORDER — ALUM & MAG HYDROXIDE-SIMETH 200-200-20 MG/5ML PO SUSP
30.0000 mL | Freq: Four times a day (QID) | ORAL | Status: DC | PRN
  Administered 2018-09-17 – 2018-09-18 (×3): 30 mL via ORAL
  Filled 2018-09-17 (×4): qty 30

## 2018-09-17 MED ORDER — FLUTICASONE PROPIONATE 50 MCG/ACT NA SUSP
2.0000 | Freq: Every day | NASAL | Status: DC
  Administered 2018-09-17 – 2018-10-05 (×19): 2 via NASAL
  Filled 2018-09-17: qty 16

## 2018-09-17 MED ORDER — LORATADINE 10 MG PO TABS
10.0000 mg | ORAL_TABLET | Freq: Every day | ORAL | Status: DC
  Administered 2018-09-18 – 2018-10-05 (×19): 10 mg via ORAL
  Filled 2018-09-17 (×18): qty 1

## 2018-09-17 NOTE — Progress Notes (Signed)
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 She was discharged home in a stable condition, but apparently she has been developing worsening weakness, generalized, worse with exertion, no improving factors, associated with malaise, chills and poor appetite. Over the last 24 hours her symptoms have been more severe to the point where she has difficulty standing. She has persistent left upper extremity edema which has been painful, sharp in nature up to 10 out of 10 intensity. Her weakness has led to dyspnea which has been severe in intensity,and triggered her emergency room visit today.  ED Course:Patient was found ill looking appearing, febrile, positive leukocytosis, concern for systemic infection, referred for admission for evaluation.  **Oncology concerned about her progression of her high-grade B-cell lymphoma and is planning on starting chemotherapy today and have ordered a PICC line placement which was done by IR Dr. Annamaria Boots.  Sepsis physiology is improved.  Patient still remains hypercalcemic so oncology gave the patient Zometa and continued with IV fluidsbut have now discontinued. She started her Rituxan the day before yesterday but did not complete it because she had some mild infusion reactions.  The completion of her chemotherapy yesterday and this morning she is doing well but then she started coughing with her medication became slightly altered and confused after pain medication.  She is made n.p.o. and SLP evaluated and did an MBS which showed patient is aspirating.  SLP recommended full liquid diet with known  aspiration risks and will discuss with the family about pain to tube and tube feedings.  Patient's hemoglobin dropped so we will give another unit of PRBCs and will also give another dose of IV Lasix.  Will change antibiotics to IV Unasyn.    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 Likely related to progressive high-grade B-cell lymphoma with pulmonary involvement comp located by suspected aspiration - ?? Etiology.  Likely secondary to progressive high-grade B-cell lymphoma with pulmonary involvement.  There was also concern for suspected aspiration pneumonia.  Patient with complaints of cough. -IVF discontinued and received Lasix -IV antibiotic therapy withIVceftriaxone and IV Metronidazole was started.  Patient status post IV Rocephin.  IV Flagyl discontinued.  Patient was on IV Unasyn.  Repeat chest x-ray negative for any acute infiltrate however concerning for persistent bilateral pleural effusions.  -Discontinued IV Unasyn after 09/14/2018's dose as patient s/p 8 days of antibiotic treatment. -Patient seen by speech therapy and noted to be high aspiration risk and patient started on a full liquid diet.   -Panda was attempted per RN however patient unable to tolerate.   -Chest x-ray done and showed stable chest x-ray demonstrating pulmonary lymphoma and bilateral pleural effusions. -Patient received doses of IV Lasix yesterday in between blood transfusions with a urine output of 4.7 L.   -Patient underwent repeat modified barium swallow with good results.  Patient currently on a regular diet with aspiration  precautions and tolerating.  -Due to ongoing cough will add Claritin and Flonase to current  regimen of PPI.  Patient also noted to be volume overloaded on examination.  Place on Lasix 40 mg IV every 12 hours x4 doses and monitor. -Follow.   High-grade B-cell Lymphoma -CT head unremarkable.   -Patient with left upper extremity pain and swelling likely secondary to high-grade B-cell lymphoma.  Left upper extremity swelling slowly improving. -Oncology consulted and patient started on R-CHOP and patient to got Rituxan and CHOP on different days -Oncology Dr. Burr Medico does not recommend a bone marrow biopsy as this would not change restaging imaging and management -Dr. Burr Medico has ordered [facility] to rule out double hit or triple hit high-grade lymphoma -PICC placed. -Solumedrol started for Hemolysis and now on po Prednisone -Rituxan was initiated, but unable to complete due to infusion reactions so will get repeat treatment with Benadryl, Tylenol and Steroids and remainder of Rituximab and CHOP received chemotherapy on 09/10/18. -Pain Control with Acetaminophen, Hydrocodone-Acetaminophen 1 tab po q6hprn, and IV Morphine 2 mg q2hprn -With her Anasarca from Cancer she is going to be given IV Lasix from Oncology and IV fluids have now been stopped; patient status post packed red blood cells.   -Due to low albumin levels patient given IV albumin. -Placed on IV Lasix 40 mg IV every 12 hours x4 doses due to volume overload.  -Per oncology.   Acute encephalopathy likely in the setting of hypercalcemia, Hypercalcemia is improving. -Improving clinically daily.  -Head CT noncontrast showed no evidence of any acute intracranial abnormality.  There is mild chronic small vessel ischemic changes in the cerebral white matter.  There is also mild chronic appearing paranasal sinusitis. -Oncology recommended MRI to rule out CNS lymphoma however unable to be done as history of pacemaker placement.  Also concerns for aspiration pneumonia.  -Patient seen by speech therapy and patient underwent MBS which showed high  risk of aspiration.  -Panda tube ordered however patient unable to tolerate.   -Patient underwent repeat modified barium swallow with good results.  Patient currently on regular diet with aspiration precautions and tolerating current diet.  -Continue monitor her mental status -Per oncology if worsening mental status may need an LP to rule out CNS lymphoma.  Hypercalcemia likely of Malignancy -Calcium level was  9.2 today and corrected calcium likely higher than that due to albumin level -Patient was given zoledronic acid IV 4 mg once 09/08/2018 per oncology -Continue to Monitor   Iron deficiency anemia/Hemolysis from [place] -Patient's hemoglobin/hematocrit went from 8.2/27.5 -> 6.9/24.2>>> 7.1>>>> 6.7>>>> 7.4>>>> 7.6>>>> 6.7>>>>8.7 -Patient status post transfusion of 8 units packed red blood cells.   -Hemoglobin is 8.7 from 6.7 from 7.4 the morning of 09/15/2018.   -Patient denies any overt bleeding.   -FOBT was positive.   -Per oncology secondary to chemotherapy and hemolysis.  LDH trending down.  Bilirubin trending down.  Transfusion threshold hemoglobin less than 7.5.  -Total bilirubin at 1.2.  Last LDH was 176 from 309.  LDH trending down. -Patient with no overt signs of bleeding. -Patient on steroids per oncology. -Continue oral iron supplementation. - Follow. -Oncology following.  Third-degree AV block s/p pacemaker -Patient currently has a V paced rhythm.  Obesity -Estimated body mass index is 30.85 kg/m as calculated from the following:   Height as of this encounter: 5' 6"  (1.676 m).   Weight as of this encounter: 86.7 kg. -Weight Loss Counseling given  Non-Severe (moderate) malnutrition in the context of acute  illness/injury -Nutrition is consulted for further evaluation recommendations -Continue with Ensure Enlive p.o. twice daily -Panda to was attempted early on today however unsuccessful as patient unable to tolerate tube placement.   -Repeat modified barium  swallow pending today. -Patient was status post IV albumin.   -Appetite improving.   Left Arm Weakness and Swelling -Likely secondary to high-grade lymphoma.   -Patient status post upper extremity Dopplers done 08/29/2018 which were negative for DVT.   -CT angiogram chest done 2019 with enlargement of left axillary, subpectoral chest wall lymph node masses with encasement of left subclavian and axillary vascular structures.  Enlargement of right hilar and mediastinal lymph nodes since prior study.  Normal arterial patency demonstrated of the left subclavian and axillary arteries.  Patient still with significant swelling left upper extremity despite diuretics and IV albumin however slightly improved since last week.  Status post IV albumin.  Hemoglobin at 8.7 this morning.  Placed on Lasix 40 mg IV every 12 hours.  Strict I's and O's.  Daily weights.   Pupil Discrepancy -Noted to be new -No Vision loss -Head CT Negative -Continue to Monitor   Thrombocytopenia -In the setting of Chemotherapy; Platelet Count is now 75 -Patient with no signs of bleeding.   -Lovenox discontinued.   -Per oncology transfuse for platelet count less than 20.   Elevated BUN -In the setting of Steroids -BUN trending back down.  Follow.  -Continue PPI.  Leukocytosis/neutropenia -Likely reactive.  Leukocytosis trending down.  WBC now at 1.6.  -Patient was on IV antibiotics presumably for possible aspiration pneumonia.   -Discontinued IV antibiotics on 09/15/2018.   -Per oncology if [place] less than 1 we will start Granix.  Hypernatremia -Improved on gentle hydration with D5W.  Discontinued D5W.  Follow.   Volume overload Likely secondary to high-grade lymphoma and hypoalbuminemia.  Status post IV albumin.  Patient was on IV Lasix.  Patient has received a total of 8 units of packed red blood cells during his hospitalization which may be contributing to volume overload.  Patient with a urine output of 4.7 L  over the past 24 hours.  Placed on Lasix 40 mg IV every 12 hours x 4 doses.  Follow.    DVT prophylaxis: SCDs Code Status: Full Family Communication: Updated patient and daughter at bedside. Disposition Plan: Pending work-up.   Consultants:   Hematology/oncology Dr. Burr Medico  Procedures:   CT head 09/07/2018  CT angiogram chest 09/07/2018  Chest x-ray 09/07/2018, 09/09/2018, 09/11/2018, 09/14/2018  PICC line  R- CHOP   Repeat modified barium swallow 09/15/2018  Antimicrobials:   Unasyn 09/12/2018>>>>> 09/15/2018  IV Rocephin 09/07/2018>>>>> 09/12/2018  IV Flagyl 09/07/2018>>>>> 09/08/2018  Status post 4 units packed red blood cells.  2 units packed red blood cells 09/14/2018  2 units packed red blood cells 09/16/2018   Subjective: Patient sitting up at the side of the bed.  Denies any shortness of breath.  Denies any chest pain.  Complaining of cough in both the supine and sitting positions.  Feeling better daily however not at baseline.  Denies any overt bleeding.  Appetite improving.  Objective: Vitals:   09/16/18 2359 09/17/18 0023 09/17/18 0240 09/17/18 0420  BP: (!) 181/73  (!) 176/76 (!) 175/84  Pulse: 75  66 68  Resp: 18  18 14   Temp: 98 F (36.7 C)  97.7 F (36.5 C) 97.7 F (36.5 C)  TempSrc: Oral  Oral Oral  SpO2: 97%  97% 98%  Weight:  124.3 kg  Height:        Intake/Output Summary (Last 24 hours) at 09/17/2018 1057 Last data filed at 09/17/2018 0420 Gross per 24 hour  Intake 480 ml  Output 3700 ml  Net -3220 ml   Filed Weights   09/09/18 0500 09/13/18 0631 09/17/18 0023  Weight: 87.8 kg 86.7 kg 124.3 kg    Examination:  General exam: NAD Eyes: Unequal pupil sizes. Respiratory system: Some decreased breath sounds in the bases.  Some bibasilar crackles.  No wheezing.  No rhonchi.  Speaking in full sentences.  No use of accessory muscles of respiration.   Cardiovascular system: Regular rate rhythm no murmurs rubs or gallops.  No JVD.   Bilateral upper extremity edema left greater than right.  Left upper extremity edema 3+.  Bilateral 1-2+ lower extremity edema left greater. Gastrointestinal system: Abdomen is soft, nontender, nondistended, positive bowel sounds.  No rebound.  No guarding. Central nervous system: Alert and oriented. No focal neurological deficits. Extremities: Symmetric 5 x 5 power. Skin: No rashes, lesions or ulcers Psychiatry: Judgement and insight appear normal. Mood & affect appropriate.     Data Reviewed: I have personally reviewed following labs and imaging studies  CBC: Recent Labs  Lab 09/11/18 0430 09/12/18 0615 09/13/18 1133 09/14/18 0449 09/14/18 1300 09/15/18 0555 09/15/18 1320 09/16/18 0500  WBC 12.8* 10.5 14.0* 8.1  --  4.0  --  2.8*  NEUTROABS 11.8* 10.1*  --  7.9*  --   --   --  2.6  HGB 8.5* 7.3* 7.7* 7.1* 6.7* 7.4* 7.6* 6.7*  HCT 29.0* 25.1* 25.0* 22.8* 21.7* 23.2* 23.7* 21.3*  MCV 94.5 93.7 93.3 93.4  --  91.3  --  91.0  PLT 91* 66* 85* 56*  --  49*  --  44*   Basic Metabolic Panel: Recent Labs  Lab 09/11/18 0430 09/12/18 0615 09/13/18 1133 09/14/18 0449 09/15/18 0555 09/16/18 0500 09/17/18 0600  NA 137 139 140 148* 143 141 142  K 4.9 5.0 4.7 3.8 3.9 3.9 3.5  CL 105 108 110 116* 111 108 104  CO2 22 22 21* 21* 26 28 29   GLUCOSE 102* 115* 135* 105* 93 94 94  BUN 66* 91* 100* 74* 48* 32* 23  CREATININE 1.02* 0.88 0.82 0.79 0.55 0.44 0.41*  CALCIUM 11.1* 10.3 9.7 8.4* 9.1 9.2 9.1  MG 2.1 2.6* 2.7* 2.3  --   --   --   PHOS 4.5 5.9* 5.7* 4.4  --   --   --    GFR: Estimated Creatinine Clearance: 86.9 mL/min (A) (by C-G formula based on SCr of 0.41 mg/dL (L)). Liver Function Tests: Recent Labs  Lab 09/12/18 0615 09/13/18 1133 09/14/18 0449 09/15/18 0555 09/16/18 0500  AST 17 26 20 21 23   ALT 12 15 11 14 14   ALKPHOS 60 59 50 42 43  BILITOT 1.4* 1.3* 0.9 1.3* 1.2  PROT 4.0* 4.0* 3.6* 4.3* 5.0*  ALBUMIN 2.3* 2.5* 2.2* 2.9* 3.7   No results for input(s):  LIPASE, AMYLASE in the last 168 hours. No results for input(s): AMMONIA in the last 168 hours. Coagulation Profile: No results for input(s): INR, PROTIME in the last 168 hours. Cardiac Enzymes: No results for input(s): CKTOTAL, CKMB, CKMBINDEX, TROPONINI in the last 168 hours. BNP (last 3 results) No results for input(s): PROBNP in the last 8760 hours. HbA1C: No results for input(s): HGBA1C in the last 72 hours. CBG: Recent Labs  Lab 09/16/18 0737  GLUCAP 84   Lipid Profile:  No results for input(s): CHOL, HDL, LDLCALC, TRIG, CHOLHDL, LDLDIRECT in the last 72 hours. Thyroid Function Tests: No results for input(s): TSH, T4TOTAL, FREET4, T3FREE, THYROIDAB in the last 72 hours. Anemia Panel: Recent Labs    09/16/18 0500  RETICCTPCT 0.3*   Sepsis Labs: No results for input(s): PROCALCITON, LATICACIDVEN in the last 168 hours.  No results found for this or any previous visit (from the past 240 hour(s)).       Radiology Studies: Dg Swallowing Func-speech Pathology  Result Date: 09/15/2018 Objective Swallowing Evaluation: Type of Study: MBS-Modified Barium Swallow Study  Patient Details Name: CLARRISA KAYLOR MRN: [id number] Date of Birth: 04-11-1946 Today's Date: 09/15/2018 Time: SLP Start Time (ACUTE ONLY): 1210 -SLP Stop Time (ACUTE ONLY): 1240 SLP Time Calculation (min) (ACUTE ONLY): 30 min Past Medical History: Past Medical History: Diagnosis Date . Arthritis  . Hiatal hernia  . History of chicken pox  . Hyperlipidemia  . Pacemaker  Past Surgical History: Past Surgical History: Procedure Laterality Date . ANKLE SURGERY   . PACEMAKER IMPLANT N/A 04/18/2018  Procedure: PACEMAKER IMPLANT;  Surgeon: Evans Lance, MD;  Location: [place] [facility];  Service: Cardiovascular;  Laterality: N/A; HPI: WESLEE PRESTAGE is a 72 y.o. female with medical history significant of recently diagnosed high grade B cell lymphoma who was at discharge from the hospital November 28 after being treated  for symptomatic anemia.  During her hospitalization she underwent left axillary lymph node biopsy.  She was discharged home in a stable condition, but apparently she has been developing worsening weakness, generalized, worse with exertion, no improving factors, associated with malaise, chills and poor appetite.  Over the last 24 hours her symptoms have been more severe to the point where she has difficulty standing. She has persistent left upper extremity edema which has been painful, sharp in nature up to 10 out of 10 intensity.  Her weakness has led to dyspnea which has been severe in intensity, and triggered her emergency room visit today.Pt was made NPO yesterday due to concerns for aspiration.   Today pt is alert, has congested weak cough concerrning for airway protection.   Pt underwent MBS yesterday and was placed on full liquid diet with precautions. RN reports pt continues to cough with po intake.  Per note from MD, now plan is for pt to have small bore feeding tube placed for nutritional support.  Unfortunately pt did not tolerate attempt at insertion of feeding tube.  Repeat swallow evaluation ordered.  SLP follow up to inform family/pt of plan and determine if clinically pt appears improved.    Subjective: Alert Assessment / Plan / Recommendation CHL IP CLINICAL IMPRESSIONS 09/15/2018 Clinical Impression Patient had a MBS on 09/12/18 and patient presented with Moderately severe oropharyngeal dysphagia with sensorimotor deficits. She presents much stronger today and presents with a mild oropharyngeal dysphagia. Oral preparation and bolus transfer was mild evidenced by trace oral residue. Patient initiated swallow at the level of the pyriform sinuses with no penetration or aspiration with cup sips of all sizes. She did have penetration with cup sip with straw, but spontaneously cleared layngeal vestibule. She initiated swallow at the valleculae with puree and cracker bolus, no penetration or aspiration.  Significant pharyngeal residue present after 1st swallow, but improved mostly cleared with second spontaneous swallow. Upper 1/3 of esphagus observed to clear well with puree and tablet. Due to significant sensorimotor improvement, recommend patient have regular diet with thin liquids, medication whole in puree. Speech therapy  to follow up for continue diet tolerance and swallowing therapy. SLP Visit Diagnosis Dysphagia, oropharyngeal phase (R13.12) Attention and concentration deficit following -- Frontal lobe and executive function deficit following -- Impact on safety and function Mild aspiration risk   CHL IP TREATMENT RECOMMENDATION 09/15/2018 Treatment Recommendations Therapy as outlined in treatment plan below   Prognosis 09/15/2018 Prognosis for Safe Diet Advancement Good Barriers to Reach Goals -- Barriers/Prognosis Comment -- CHL IP DIET RECOMMENDATION 09/15/2018 SLP Diet Recommendations Regular solids;Thin liquid Liquid Administration via Cup;No straw Medication Administration Whole meds with liquid Compensations Slow rate;Small sips/bites;Other (Comment) Postural Changes Remain semi-upright after after feeds/meals (Comment);Seated upright at 90 degrees   CHL IP OTHER RECOMMENDATIONS 09/15/2018 Recommended Consults -- Oral Care Recommendations Oral care before and after PO Other Recommendations --   CHL IP FOLLOW UP RECOMMENDATIONS 09/14/2018 Follow up Recommendations (No Data)   CHL IP FREQUENCY AND DURATION 09/15/2018 Speech Therapy Frequency (ACUTE ONLY) min 2x/week Treatment Duration 1 week      CHL IP ORAL PHASE 09/15/2018 Oral Phase Impaired Oral - Pudding Teaspoon -- Oral - Pudding Cup Lingual/palatal residue Oral - Honey Teaspoon -- Oral - Honey Cup -- Oral - Nectar Teaspoon NT Oral - Nectar Cup NT Oral - Nectar Straw NT Oral - Thin Teaspoon Lingual/palatal residue Oral - Thin Cup Lingual/palatal residue;Impaired mastication Oral - Thin Straw Lingual/palatal residue Oral - Puree Lingual/palatal  residue Oral - Mech Soft Lingual/palatal residue Oral - Regular -- Oral - Multi-Consistency -- Oral - Pill -- Oral Phase - Comment --  CHL IP PHARYNGEAL PHASE 09/15/2018 Pharyngeal Phase Impaired Pharyngeal- Pudding Teaspoon Delayed swallow initiation-vallecula;Pharyngeal residue - valleculae;Pharyngeal residue - pyriform Pharyngeal -- Pharyngeal- Pudding Cup NT Pharyngeal -- Pharyngeal- Honey Teaspoon -- Pharyngeal -- Pharyngeal- Honey Cup -- Pharyngeal -- Pharyngeal- Nectar Teaspoon NT Pharyngeal -- Pharyngeal- Nectar Cup NT Pharyngeal -- Pharyngeal- Nectar Straw NT Pharyngeal -- Pharyngeal- Thin Teaspoon Delayed swallow initiation-pyriform sinuses;Reduced pharyngeal peristalsis;Reduced anterior laryngeal mobility;Reduced laryngeal elevation;Reduced airway/laryngeal closure;Reduced tongue base retraction;Pharyngeal residue - valleculae;Pharyngeal residue - pyriform Pharyngeal -- Pharyngeal- Thin Cup Delayed swallow initiation-pyriform sinuses;Reduced anterior laryngeal mobility;Reduced laryngeal elevation;Reduced airway/laryngeal closure;Reduced tongue base retraction;Pharyngeal residue - valleculae;Pharyngeal residue - pyriform;Reduced pharyngeal peristalsis Pharyngeal -- Pharyngeal- Thin Straw Delayed swallow initiation-pyriform sinuses;Reduced anterior laryngeal mobility;Reduced laryngeal elevation;Reduced airway/laryngeal closure;Reduced pharyngeal peristalsis;Reduced tongue base retraction;Pharyngeal residue - valleculae;Pharyngeal residue - pyriform;Penetration/Aspiration during swallow Pharyngeal -- Pharyngeal- Puree Delayed swallow initiation-vallecula;Reduced laryngeal elevation;Reduced airway/laryngeal closure;Reduced tongue base retraction;Pharyngeal residue - valleculae Pharyngeal -- Pharyngeal- Mechanical Soft Delayed swallow initiation-vallecula;Reduced laryngeal elevation;Reduced airway/laryngeal closure;Reduced tongue base retraction Pharyngeal -- Pharyngeal- Regular NT Pharyngeal -- Pharyngeal-  Multi-consistency -- Pharyngeal -- Pharyngeal- Pill Delayed swallow initiation-vallecula;Reduced laryngeal elevation;Reduced airway/laryngeal closure Pharyngeal -- Pharyngeal Comment --  CHL IP CERVICAL ESOPHAGEAL PHASE 09/15/2018 Cervical Esophageal Phase WFL Pudding Teaspoon -- Pudding Cup -- Honey Teaspoon -- Honey Cup -- Nectar Teaspoon -- Nectar Cup -- Nectar Straw -- Thin Teaspoon -- Thin Cup -- Thin Straw -- Puree -- Mechanical Soft -- Regular -- Multi-consistency -- Pill -- Cervical Esophageal Comment -- Charlynne Cousins Ward, MA, CCC-SLP 09/15/2018 2:59 PM                   Scheduled Meds: . sodium chloride   Intravenous Once  . sodium chloride   Intravenous Once  . acyclovir  400 mg Oral BID  . allopurinol  300 mg Oral Daily  . cholecalciferol  2,000 Units Oral Daily  . famotidine  20 mg Oral Daily  . feeding supplement (ENSURE ENLIVE)  237 mL Oral BID BM  . fluticasone  2 spray Each Nare Daily  . folic acid  1 mg Oral Daily  . furosemide  40 mg Intravenous Q12H  . loratadine  10 mg Oral Daily  . mouth rinse  15 mL Mouth Rinse BID  . mupirocin ointment   Topical BID  . pantoprazole (PROTONIX) IV  40 mg Intravenous Q12H  . polyethylene glycol  17 g Oral Daily  . predniSONE  40 mg Oral Q breakfast  . senna-docusate  1 tablet Oral BID   Continuous Infusions: . sodium chloride Stopped (09/12/18 1805)  . sodium chloride       LOS: 10 days    Time spent: 40 mins    Irine Seal, MD [facility] Pager [phone]  If 7PM-7AM, please contact night-coverage [email or web address] Password [facility] 09/17/2018, 10:57 AM

## 2018-09-18 LAB — CBC WITH DIFFERENTIAL/PLATELET
ABS IMMATURE GRANULOCYTES: 0.02 10*3/uL (ref 0.00–0.07)
Basophils Absolute: 0 10*3/uL (ref 0.0–0.1)
Basophils Relative: 2 %
Eosinophils Absolute: 0 10*3/uL (ref 0.0–0.5)
Eosinophils Relative: 4 %
HCT: 26.8 % — ABNORMAL LOW (ref 36.0–46.0)
Hemoglobin: 8.9 g/dL — ABNORMAL LOW (ref 12.0–15.0)
Immature Granulocytes: 4 %
Lymphocytes Relative: 7 %
Lymphs Abs: 0 10*3/uL — ABNORMAL LOW (ref 0.7–4.0)
MCH: 28.7 pg (ref 26.0–34.0)
MCHC: 33.2 g/dL (ref 30.0–36.0)
MCV: 86.5 fL (ref 80.0–100.0)
MONO ABS: 0 10*3/uL — AB (ref 0.1–1.0)
Monocytes Relative: 2 %
Neutro Abs: 0.5 10*3/uL — ABNORMAL LOW (ref 1.7–7.7)
Neutrophils Relative %: 81 %
Platelets: 59 10*3/uL — ABNORMAL LOW (ref 150–400)
RBC: 3.1 MIL/uL — ABNORMAL LOW (ref 3.87–5.11)
RDW: 16.1 % — ABNORMAL HIGH (ref 11.5–15.5)
WBC: 0.6 10*3/uL — CL (ref 4.0–10.5)
nRBC: 0 % (ref 0.0–0.2)

## 2018-09-18 LAB — BASIC METABOLIC PANEL
Anion gap: 8 (ref 5–15)
BUN: 17 mg/dL (ref 8–23)
CHLORIDE: 99 mmol/L (ref 98–111)
CO2: 34 mmol/L — AB (ref 22–32)
Calcium: 8.9 mg/dL (ref 8.9–10.3)
Creatinine, Ser: 0.38 mg/dL — ABNORMAL LOW (ref 0.44–1.00)
GFR calc Af Amer: >60 mL/min (ref >60)
GFR calc non Af Amer: >60 mL/min (ref >60)
GLUCOSE: 105 mg/dL — AB (ref 70–99)
Potassium: 3.1 mmol/L — ABNORMAL LOW (ref 3.5–5.1)
Sodium: 141 mmol/L (ref 135–145)

## 2018-09-18 LAB — MAGNESIUM: Magnesium: 1.8 mg/dL (ref 1.7–2.4)

## 2018-09-18 MED ORDER — ALUM & MAG HYDROXIDE-SIMETH 200-200-20 MG/5ML PO SUSP
30.0000 mL | Freq: Four times a day (QID) | ORAL | Status: DC | PRN
  Administered 2018-09-18: 30 mL via ORAL
  Filled 2018-09-18: qty 30

## 2018-09-18 MED ORDER — MAGNESIUM SULFATE 4 GM/100ML IV SOLN
4.0000 g | Freq: Once | INTRAVENOUS | Status: AC
  Administered 2018-09-18: 4 g via INTRAVENOUS
  Filled 2018-09-18: qty 100

## 2018-09-18 MED ORDER — POTASSIUM CHLORIDE 20 MEQ PO PACK
40.0000 meq | PACK | ORAL | Status: AC
  Administered 2018-09-18 (×2): 40 meq via ORAL
  Filled 2018-09-18 (×2): qty 2

## 2018-09-18 MED ORDER — ZOLPIDEM TARTRATE 5 MG PO TABS
5.0000 mg | ORAL_TABLET | Freq: Once | ORAL | Status: AC
  Administered 2018-09-18: 5 mg via ORAL
  Filled 2018-09-18: qty 1

## 2018-09-18 MED ORDER — TBO-FILGRASTIM 480 MCG/0.8ML ~~LOC~~ SOSY
480.0000 ug | PREFILLED_SYRINGE | Freq: Every day | SUBCUTANEOUS | Status: DC
  Administered 2018-09-18 – 2018-09-22 (×5): 480 ug via SUBCUTANEOUS
  Filled 2018-09-18 (×6): qty 0.8

## 2018-09-18 MED ORDER — LIDOCAINE VISCOUS HCL 2 % MT SOLN
15.0000 mL | Freq: Four times a day (QID) | OROMUCOSAL | Status: DC | PRN
  Administered 2018-09-18: 15 mL via ORAL
  Filled 2018-09-18: qty 15

## 2018-09-18 NOTE — Progress Notes (Signed)
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 She was discharged home in a stable condition, but apparently she has been developing worsening weakness, generalized, worse with exertion, no improving factors, associated with malaise, chills and poor appetite. Over the last 24 hours her symptoms have been more severe to the point where she has difficulty standing. She has persistent left upper extremity edema which has been painful, sharp in nature up to 10 out of 10 intensity. Her weakness has led to dyspnea which has been severe in intensity,and triggered her emergency room visit today.  ED Course:Patient was found ill looking appearing, febrile, positive leukocytosis, concern for systemic infection, referred for admission for evaluation.  **Oncology concerned about her progression of her high-grade B-cell lymphoma and is planning on starting chemotherapy today and have ordered a PICC line placement which was done by IR Dr. Annamaria Boots.  Sepsis physiology is improved.  Patient still remains hypercalcemic so oncology gave the patient Zometa and continued with IV fluidsbut have now discontinued. She started her Rituxan the day before yesterday but did not complete it because she had some mild infusion reactions.  The completion of her chemotherapy yesterday and this morning she is doing well but then she started coughing with her medication became slightly altered and confused after pain medication.  She is made n.p.o. and SLP evaluated and did an MBS which showed patient is aspirating.  SLP recommended full liquid diet with known  aspiration risks and will discuss with the family about pain to tube and tube feedings.  Patient's hemoglobin dropped so we will give another unit of PRBCs and will also give another dose of IV Lasix.  Will change antibiotics to IV Unasyn.    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 Likely related to progressive high-grade B-cell lymphoma with pulmonary involvement comp located by suspected aspiration - ?? Etiology.  Likely secondary to progressive high-grade B-cell lymphoma with pulmonary involvement.  There was also concern for suspected aspiration pneumonia.  Patient with complaints of cough. -IVF discontinued and received Lasix -IV antibiotic therapy withIVceftriaxone and IV Metronidazole was started.  Patient status post IV Rocephin.  IV Flagyl discontinued.  Patient was on IV Unasyn.  Repeat chest x-ray negative for any acute infiltrate however concerning for persistent bilateral pleural effusions.  -Discontinued IV Unasyn after 09/14/2018's dose as patient s/p 8 days of antibiotic treatment. -Patient seen by speech therapy and noted to be high aspiration risk and patient started on a full liquid diet.   -Panda was attempted per RN however patient unable to tolerate.   -Chest x-ray done and showed stable chest x-ray demonstrating pulmonary lymphoma and bilateral pleural effusions. -Patient received doses of IV Lasix in between blood transfusions with a urine output of 4.7 L.   -Patient underwent repeat modified barium swallow with good results.  Patient currently on a regular diet with aspiration precautions  and tolerating.  -Due to ongoing cough Claritin, Flonase, PPI added. Patient also noted to  be volume overloaded on examination.  Patient placed on Lasix 40 mg IV every 12 hours x4 doses with a urine output of 3.2 L over the past 24 hours.  -Follow.   High-grade B-cell Lymphoma -CT head unremarkable.   -Patient with left upper extremity pain and swelling likely secondary to high-grade B-cell lymphoma.  Left upper extremity swelling slowly improving. -Oncology consulted and patient started on R-CHOP and patient to got Rituxan and CHOP on different days -Oncology Dr. Burr Medico does not recommend a bone marrow biopsy as this would not change restaging imaging and management -Dr. Burr Medico has ordered [facility] to rule out double hit or triple hit high-grade lymphoma -PICC placed. -Solumedrol started for Hemolysis and now on po Prednisone -Rituxan was initiated, but unable to complete due to infusion reactions so will get repeat treatment with Benadryl, Tylenol and Steroids and remainder of Rituximab and CHOP received chemotherapy on 09/10/18. -Pain Control with Acetaminophen, Hydrocodone-Acetaminophen 1 tab po q6hprn, and IV Morphine 2 mg q2hprn -With her Anasarca from Cancer she is going to be given IV Lasix from Oncology and IV fluids have now been stopped; patient status post packed red blood cells.   -Due to low albumin levels patient given IV albumin. -Placed on IV Lasix 40 mg IV every 12 hours x4 doses due to volume overload on 09/17/2018.  -Per oncology.   Acute encephalopathy likely in the setting of hypercalcemia, Hypercalcemia is improving. -Clinically improving daily.  -Head CT noncontrast showed no evidence of any acute intracranial abnormality.  There is mild chronic small vessel ischemic changes in the cerebral white matter.  There is also mild chronic appearing paranasal sinusitis. -Oncology recommended MRI to rule out CNS lymphoma however unable to be done as history of pacemaker placement.  Also concerns for aspiration pneumonia.  -Patient seen by speech therapy and patient underwent  MBS which showed high risk of aspiration.  -Panda tube ordered however patient unable to tolerate.   -Patient underwent repeat modified barium swallow with good results.  Patient currently on regular diet with aspiration precautions and tolerating current diet.  -Continue monitor her mental status -Per oncology if worsening mental status may need an LP to rule out CNS lymphoma.  Hypercalcemia likely of Malignancy -Calcium level was  8.9 today and corrected calcium likely higher than that due to albumin level -Patient was given zoledronic acid IV 4 mg once 09/08/2018 per oncology -Continue to Monitor   Iron deficiency anemia/Hemolysis from [place] -Patient's hemoglobin/hematocrit went from 8.2/27.5 -> 6.9/24.2>>> 7.1>>>> 6.7>>>> 7.4>>>> 7.6>>>> 6.7>>>>8.7>>> 8.9 -Patient status post transfusion of 8 units packed red blood cells.   -Hemoglobin is 8.9 from 8.7 from 6.7 from 7.4 the morning of 09/15/2018.   -Patient denies any overt bleeding.   -FOBT was positive.   -Per oncology secondary to chemotherapy and hemolysis.  LDH trending down.  Bilirubin trending down.  Transfusion threshold hemoglobin less than 7.5.  -Total bilirubin at 1.2.  Last LDH was 176 from 309.  LDH trending down. -Patient with no overt signs of bleeding. -Patient on steroids per oncology. -Continue oral iron supplementation. - Follow. -Oncology following.  Third-degree AV block s/p pacemaker -Patient currently has a V paced rhythm.  Obesity -Estimated body mass index is 30.85 kg/m as calculated from the following:   Height as of this encounter: _0  (1.676 m).   Weight as of this encounter: 86.7 kg. -Weight Loss Counseling given  Non-Severe (moderate) malnutrition in the context of acute illness/injury -Nutrition is consulted for further evaluation recommendations -Continue with Ensure Enlive p.o. twice daily -Panda to was attempted early on today however unsuccessful as patient unable to tolerate tube  placement.   -Repeat modified barium swallow done with improvement. -Patient now on a regular diet with aspiration precautions. -Patient was status post IV albumin.   -Appetite improving.   Left Arm Weakness and Swelling -Likely secondary to high-grade lymphoma.   -Patient status post upper extremity Dopplers done 08/29/2018 which were negative for DVT.   -CT angiogram chest done 2019 with enlargement of left axillary, subpectoral chest wall lymph node masses with encasement of left subclavian and axillary vascular structures.  Enlargement of right hilar and mediastinal lymph nodes since prior study.  Normal arterial patency demonstrated of the left subclavian and axillary arteries.  Patient still with significant swelling left upper extremity despite diuretics and IV albumin however slightly improved since last week.  Status post IV albumin.  Hemoglobin at 8.9 this morning.  Placed on Lasix 40 mg IV every 12 hours x 4 doses.  Strict I's and O's.  Daily weights.   Pupil Discrepancy -Noted to be new -No Vision loss -Head CT Negative -Continue to Monitor   Thrombocytopenia -In the setting of Chemotherapy; Platelet Count is now trending back up and at 59. -Patient with no signs of bleeding.   -Lovenox discontinued.   -Per oncology transfuse for platelet count less than 20.   Elevated BUN -In the setting of Steroids -BUN trending back down.  Follow.  -Continue PPI.  Leukocytosis/neutropenia -Likely reactive.  Leukocytosis trending down.  Patient now with a neutropenia with a white count of 0.6 and a ANC of 0.5.  -Patient was on IV antibiotics presumably for possible aspiration pneumonia.   -Discontinued IV antibiotics on 09/15/2018.   -Start Granix daily.  Once ANC is greater than 1 we will discontinue Granix.   -Per oncology.   Hypernatremia -Improved on gentle hydration with D5W.  Discontinued D5W.  Follow.   Volume overload Likely secondary to high-grade lymphoma and  hypoalbuminemia.  Status post IV albumin.  Patient was on IV Lasix.  Patient has received a total of 8 units of packed red blood cells during his hospitalization which may be contributing to volume overload.  Patient with a urine output of 3.2 L over the past 24 hours.  Placed on Lasix 40 mg IV every 12 hours x 4 doses.  Follow.  Hypokalemia Secondary to diuresis.  Replete.    DVT prophylaxis: SCDs Code Status: Full Family Communication: Updated patient and daughter at bedside. Disposition Plan: Pending work-up and per oncology.   Consultants:   Hematology/oncology Dr. Burr Medico  Procedures:   CT head 09/07/2018  CT angiogram chest 09/07/2018  Chest x-ray 09/07/2018, 09/09/2018, 09/11/2018, 09/14/2018  PICC line  R- CHOP   Repeat modified barium swallow 09/15/2018  Antimicrobials:   Unasyn 09/12/2018>>>>> 09/15/2018  IV Rocephin 09/07/2018>>>>> 09/12/2018  IV Flagyl 09/07/2018>>>>> 09/08/2018  Status post 4 units packed red blood cells.  2 units packed red blood cells 09/14/2018  2 units packed red blood cells 09/16/2018   Subjective: Patient sitting up in bed.  Complaining of heartburn.  Denies any shortness of breath.  No chest pain.  Still with a cough.  Good urine output.  Appetite improving.  No bloody bowel movements.   Objective: Vitals:   09/17/18 0420 09/17/18 1330 09/17/18 2000 09/18/18 0359  BP: (!) 175/84 (!) 156/86 140/88 140/86  Pulse: 68 79 82 74  Resp: _0 Temp: 97.7 F (36.5 C) 98.2 F (36.8 C) 98.4 F (36.9 C) 98.7 F (37.1 C)  TempSrc: Oral Oral Oral Oral  SpO2: 98% 100% 99% 98%  Weight:    126 kg  Height:        Intake/Output Summary (Last 24 hours) at 09/18/2018 1208 Last data filed at 09/17/2018 2141 Gross per 24 hour  Intake 240 ml  Output 3200 ml  Net -2960 ml   Filed Weights   09/13/18 0631 09/17/18 0023 09/18/18 0359  Weight: 86.7 kg 124.3 kg 126 kg    Examination:  General exam: NAD Eyes: Unequal pupil  sizes. Respiratory system: Decreased breath sounds in the bases.  No crackles.  No rhonchi.  No wheezing.  Speaking in full sentences.  Cardiovascular system: RRR no murmurs rubs or gallops.  No JVD.  2-3+ left upper extremity edema.  2+ left lower extremity edema.  Trace right lower extremity edema.  Gastrointestinal system: Abdomen is nontender, nondistended, soft, positive bowel sounds.  No rebound.  No guarding.  Central nervous system: Alert and oriented. No focal neurological deficits. Extremities: Symmetric 5 x 5 power. Skin: No rashes, lesions or ulcers Psychiatry: Judgement and insight appear normal. Mood & affect appropriate.     Data Reviewed: I have personally reviewed following labs and imaging studies  CBC: Recent Labs  Lab 09/12/18 0615  09/14/18 0449  09/15/18 0555 09/15/18 1320 09/16/18 0500 09/17/18 0600 09/18/18 0422  WBC 10.5   < > 8.1  --  4.0  --  2.8* 1.6* 0.6*  NEUTROABS 10.1*  --  7.9*  --   --   --  2.6 1.4* 0.5*  HGB 7.3*   < > 7.1*   < > 7.4* 7.6* 6.7* 8.7* 8.9*  HCT 25.1*   < > 22.8*   < > 23.2* 23.7* 21.3* 26.7* 26.8*  MCV 93.7   < > 93.4  --  91.3  --  91.0 88.1 86.5  PLT 66*   < > 56*  --  49*  --  44* 54* 59*   < > = values in this interval not displayed.   Basic Metabolic Panel: Recent Labs  Lab 09/12/18 0615 09/13/18 1133 09/14/18 0449 09/15/18 0555 09/16/18 0500 09/17/18 0600 09/18/18 0422  NA 139 140 148* 143 141 142 141  K 5.0 4.7 3.8 3.9 3.9 3.5 3.1*  CL 108 110 116* 111 108 104 99  CO2 22 21* 21* _1 34*  GLUCOSE 115* 135* 105* 93 94 94 105*  BUN 91* 100* 74* 48* 32* 23 17  CREATININE 0.88 0.82 0.79 0.55 0.44 0.41* 0.38*  CALCIUM 10.3 9.7 8.4* 9.1 9.2 9.1 8.9  MG 2.6* 2.7* 2.3  --   --   --  1.8  PHOS 5.9* 5.7* 4.4  --   --   --   --    GFR: Estimated Creatinine Clearance: 87.6 mL/min (A) (by C-G formula based on SCr of 0.38 mg/dL (L)). Liver Function Tests: Recent Labs  Lab 09/12/18 0615 09/13/18 1133  09/14/18 0449 09/15/18 0555 09/16/18 0500  AST _2 ALT _3 ALKPHOS 60 59 50 42 43  BILITOT 1.4* 1.3* 0.9 1.3* 1.2  PROT 4.0* 4.0* 3.6* 4.3* 5.0*  ALBUMIN 2.3* 2.5* 2.2* 2.9* 3.7   No results for input(s): LIPASE, AMYLASE in the last 168 hours. No results for input(s):  AMMONIA in the last 168 hours. Coagulation Profile: No results for input(s): INR, PROTIME in the last 168 hours. Cardiac Enzymes: No results for input(s): CKTOTAL, CKMB, CKMBINDEX, TROPONINI in the last 168 hours. BNP (last 3 results) No results for input(s): PROBNP in the last 8760 hours. HbA1C: No results for input(s): HGBA1C in the last 72 hours. CBG: Recent Labs  Lab 09/16/18 0737  GLUCAP 84   Lipid Profile: No results for input(s): CHOL, HDL, LDLCALC, TRIG, CHOLHDL, LDLDIRECT in the last 72 hours. Thyroid Function Tests: No results for input(s): TSH, T4TOTAL, FREET4, T3FREE, THYROIDAB in the last 72 hours. Anemia Panel: Recent Labs    09/16/18 0500 09/17/18 0925  FERRITIN  --  1,572*  TIBC  --  225*  IRON  --  39  RETICCTPCT 0.3*  --    Sepsis Labs: No results for input(s): PROCALCITON, LATICACIDVEN in the last 168 hours.  No results found for this or any previous visit (from the past 240 hour(s)).       Radiology Studies: Dg Chest 2 View  Result Date: 09/17/2018 CLINICAL DATA:  Cough. EXAM: CHEST - 2 VIEW COMPARISON:  Radiograph of September 14, 2018. FINDINGS: Stable cardiomediastinal silhouette. Large hiatal hernia is noted. No pneumothorax is noted. Right-sided pacemaker is unchanged in position. Mild bilateral pleural effusions are noted with associated atelectasis. Bony thorax is unremarkable. IMPRESSION: Stable large hiatal hernia. Mild bilateral pleural effusions are noted with associated atelectasis. Electronically Signed   By: Marijo Conception, M.D.   On: 09/17/2018 14:16        Scheduled Meds: . sodium chloride   Intravenous Once  . sodium chloride    Intravenous Once  . acyclovir  400 mg Oral BID  . allopurinol  300 mg Oral Daily  . cholecalciferol  2,000 Units Oral Daily  . famotidine  20 mg Oral Daily  . feeding supplement (ENSURE ENLIVE)  237 mL Oral BID BM  . fluticasone  2 spray Each Nare Daily  . folic acid  1 mg Oral Daily  . furosemide  40 mg Intravenous Q12H  . loratadine  10 mg Oral Daily  . mouth rinse  15 mL Mouth Rinse BID  . mupirocin ointment   Topical BID  . pantoprazole (PROTONIX) IV  40 mg Intravenous Q12H  . polyethylene glycol  17 g Oral Daily  . potassium chloride  40 mEq Oral Q4H  . predniSONE  40 mg Oral Q breakfast  . senna-docusate  1 tablet Oral BID  . Tbo-filgastrim (GRANIX) SQ  480 mcg Subcutaneous Daily   Continuous Infusions: . sodium chloride Stopped (09/12/18 1805)  . sodium chloride    . magnesium sulfate 1 - 4 g bolus IVPB 4 g (09/18/18 1131)     LOS: 11 days    Time spent: 40 mins    Irine Seal, MD [facility] Pager [phone]  If 7PM-7AM, please contact night-coverage [email or web address] Password [email or web address] 09/18/2018, 12:08 PM

## 2018-09-19 DIAGNOSIS — Z6841 Body Mass Index (BMI) 40.0 and over, adult: Secondary | ICD-10-CM

## 2018-09-19 LAB — CBC WITH DIFFERENTIAL/PLATELET
HCT: 26.7 % — ABNORMAL LOW (ref 36.0–46.0)
Hemoglobin: 8.8 g/dL — ABNORMAL LOW (ref 12.0–15.0)
MCH: 28.9 pg (ref 26.0–34.0)
MCHC: 33 g/dL (ref 30.0–36.0)
MCV: 87.5 fL (ref 80.0–100.0)
Platelets: 60 10*3/uL — ABNORMAL LOW (ref 150–400)
RBC: 3.05 MIL/uL — ABNORMAL LOW (ref 3.87–5.11)
RDW: 15.9 % — ABNORMAL HIGH (ref 11.5–15.5)
WBC: 0.1 10*3/uL — CL (ref 4.0–10.5)
nRBC: 0 % (ref 0.0–0.2)

## 2018-09-19 LAB — COMPREHENSIVE METABOLIC PANEL
ALK PHOS: 61 U/L (ref 38–126)
ALT: 20 U/L (ref 0–44)
AST: 23 U/L (ref 15–41)
Albumin: 3.5 g/dL (ref 3.5–5.0)
Anion gap: 7 (ref 5–15)
BUN: 15 mg/dL (ref 8–23)
CALCIUM: 8.7 mg/dL — AB (ref 8.9–10.3)
CO2: 35 mmol/L — ABNORMAL HIGH (ref 22–32)
Chloride: 98 mmol/L (ref 98–111)
Creatinine, Ser: 0.4 mg/dL — ABNORMAL LOW (ref 0.44–1.00)
GFR calc Af Amer: >60 mL/min (ref >60)
GFR calc non Af Amer: >60 mL/min (ref >60)
Glucose, Bld: 110 mg/dL — ABNORMAL HIGH (ref 70–99)
Potassium: 3.6 mmol/L (ref 3.5–5.1)
Sodium: 140 mmol/L (ref 135–145)
Total Bilirubin: 2.1 mg/dL — ABNORMAL HIGH (ref 0.3–1.2)
Total Protein: 5.2 g/dL — ABNORMAL LOW (ref 6.5–8.1)

## 2018-09-19 LAB — LACTATE DEHYDROGENASE: LDH: 210 U/L — ABNORMAL HIGH (ref 98–192)

## 2018-09-19 LAB — MAGNESIUM: Magnesium: 2.1 mg/dL (ref 1.7–2.4)

## 2018-09-19 MED ORDER — POTASSIUM CHLORIDE 20 MEQ PO PACK
40.0000 meq | PACK | Freq: Once | ORAL | Status: AC
  Administered 2018-09-19: 40 meq via ORAL
  Filled 2018-09-19: qty 2

## 2018-09-19 NOTE — Progress Notes (Signed)
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 She was discharged home in a stable condition, but apparently she has been developing worsening weakness, generalized, worse with exertion, no improving factors, associated with malaise, chills and poor appetite. Over the last 24 hours her symptoms have been more severe to the point where she has difficulty standing. She has persistent left upper extremity edema which has been painful, sharp in nature up to 10 out of 10 intensity. Her weakness has led to dyspnea which has been severe in intensity,and triggered her emergency room visit today.  ED Course:Patient was found ill looking appearing, febrile, positive leukocytosis, concern for systemic infection, referred for admission for evaluation.  **Oncology concerned about her progression of her high-grade B-cell lymphoma and is planning on starting chemotherapy today and have ordered a PICC line placement which was done by IR Dr. Annamaria Murillo.  Sepsis physiology is improved.  Patient still remains hypercalcemic so oncology gave the patient Zometa and continued with IV fluidsbut have now discontinued. She started her Rituxan the day before yesterday but did not complete it because she had some mild infusion reactions.  The completion of her chemotherapy yesterday and this morning she is doing well but then she started coughing with her medication became slightly altered and confused after pain medication.  She is made n.p.o. and SLP evaluated and did an MBS which showed patient is aspirating.  SLP recommended full liquid diet with known  aspiration risks and will discuss with the family about pain to tube and tube feedings.  Patient's hemoglobin dropped so we will give another unit of PRBCs and will also give another dose of IV Lasix.  Will change antibiotics to IV Unasyn.    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 Likely related to progressive high-grade B-cell lymphoma with pulmonary involvement comp located by suspected aspiration - ?? Etiology.  Likely secondary to progressive high-grade B-cell lymphoma with pulmonary involvement.  There was also concern for suspected aspiration pneumonia.  Patient with complaints of cough. -IVF discontinued and received Lasix -IV antibiotic therapy withIVceftriaxone and IV Metronidazole was started.  Patient status post IV Rocephin.  IV Flagyl discontinued.  Patient was on IV Unasyn.  Repeat chest x-ray negative for any acute infiltrate however concerning for persistent bilateral pleural effusions.  -Discontinued IV Unasyn after 09/14/2018's dose as patient s/p 8 days of antibiotic treatment. -Patient seen by speech therapy and noted to be high aspiration risk and patient started on a full liquid diet.   -Panda was attempted per RN however patient unable to tolerate.   -Chest x-ray done and showed stable chest x-ray demonstrating pulmonary lymphoma and bilateral pleural effusions. -Patient received doses of IV Lasix in between blood transfusions with a urine output of 4.9 L overnight.  Patient is -11.337 L during this hospitalization..   -Patient underwent repeat modified barium swallow with good results.  Patient currently on a regular diet with aspiration precautions and tolerating.  -Due to  ongoing cough Claritin, Flonase, PPI added. Patient also noted to be volume overloaded on examination.  Patient placed on Lasix 40 mg IV every 12 hours x4 doses with a urine output of 4.9 L over the past 24 hours.  -Follow.   High-grade B-cell Lymphoma -CT head unremarkable.   -Patient with left upper extremity pain and swelling likely secondary to high-grade B-cell lymphoma.  Left upper extremity swelling slowly improving. -Oncology consulted and patient started on R-CHOP and patient to got Rituxan and CHOP on different days -Oncology Dr. Burr Murillo does not recommend a bone marrow biopsy as this would not change restaging imaging and management -Dr. Burr Murillo has ordered [facility] to rule out double hit or triple hit high-grade lymphoma -PICC placed. -Solumedrol started for Hemolysis and now on po Prednisone -Rituxan was initiated, but unable to complete due to infusion reactions so will get repeat treatment with Benadryl, Tylenol and Steroids and remainder of Rituximab and CHOP received chemotherapy on 09/10/18. -Pain Control with Acetaminophen, Hydrocodone-Acetaminophen 1 tab po q6hprn, and IV Morphine 2 mg q2hprn -With her Anasarca from Cancer she is going to be given IV Lasix from Oncology and IV fluids have now been stopped; patient status post packed red blood cells.   -Due to low albumin levels patient given IV albumin. -Placed on IV Lasix 40 mg IV every 12 hours x4 doses due to volume overload on 09/17/2018.  -Per oncology.   Acute encephalopathy likely in the setting of hypercalcemia, Hypercalcemia is improving. -Clinically improving daily.  -Head CT noncontrast showed no evidence of any acute intracranial abnormality.  There is mild chronic small vessel ischemic changes in the cerebral white matter.  There is also mild chronic appearing paranasal sinusitis. -Oncology recommended MRI to rule out CNS lymphoma however unable to be done as history of pacemaker placement.  Also concerns for aspiration  pneumonia.  -Patient seen by speech therapy and patient underwent MBS which showed high risk of aspiration.  -Panda tube ordered however patient unable to tolerate.   -Patient underwent repeat modified barium swallow with good results.  Patient currently on regular diet with aspiration precautions and tolerating current diet.  -Continue monitor her mental status -Per oncology if worsening mental status may need an LP to rule out CNS lymphoma.  Hypercalcemia likely of Malignancy -Calcium level was  8.7 today and corrected calcium likely higher than that due to albumin level -Patient was given zoledronic acid IV 4 mg once 09/08/2018 per oncology -Continue to Monitor   Iron deficiency anemia/Hemolysis from [place] -Patient's hemoglobin/hematocrit went from 8.2/27.5 -> 6.9/24.2>>> 7.1>>>> 6.7>>>> 7.4>>>> 7.6>>>> 6.7>>>>8.7>>> 8.9>>> 8.8 -Patient status post transfusion of 8 units packed red blood cells.   -Hemoglobin is 8.8 from 8.9 from 8.7 from 6.7 from 7.4 the morning of 09/15/2018.   -Patient denies any overt bleeding.   -FOBT was positive.   -Per oncology secondary to chemotherapy and hemolysis.  LDH trending down.  Bilirubin trending down.  Transfusion threshold hemoglobin less than 7.5.  -Total bilirubin at 1.2.  Last LDH was 176 from 309.  LDH trending down. -Patient with no overt signs of bleeding. -Patient on steroids per oncology. -Continue oral iron supplementation. - Follow. -Oncology following.  Third-degree AV block s/p pacemaker -Patient currently has a V paced rhythm.  Obesity -Estimated body mass index is 30.85 kg/m as calculated from the following:   Height as of this encounter: _0  (1.676 m).  Weight as of this encounter: 86.7 kg. -Weight Loss Counseling given  Non-Severe (moderate) malnutrition in the context of acute illness/injury -Nutrition is consulted for further evaluation recommendations -Continue with Ensure Enlive p.o. twice daily -Panda to was  attempted early on today however unsuccessful as patient unable to tolerate tube placement.   -Repeat modified barium swallow done with improvement. -Patient now on a regular diet with aspiration precautions. -Patient was status post IV albumin.   -Appetite improving.   Left Arm Weakness and Swelling -Likely secondary to high-grade lymphoma.   -Patient status post upper extremity Dopplers done 08/29/2018 which were negative for DVT.   -CT angiogram chest done 2019 with enlargement of left axillary, subpectoral chest wall lymph node masses with encasement of left subclavian and axillary vascular structures.  Enlargement of right hilar and mediastinal lymph nodes since prior study.  Normal arterial patency demonstrated of the left subclavian and axillary arteries.  Patient still with significant swelling left upper extremity despite diuretics and IV albumin however slightly improved since last week.  Status post IV albumin.  Hemoglobin at 8.8 this morning.  Patient received Lasix 40 mg IV every 12 hours x 4 doses on 09/17/2018.  Patient with a urine output of 4.9 L.  Strict I's and O's.  Daily weights.   Pupil Discrepancy -Noted to be new -No Vision loss -Head CT Negative -Continue to Monitor   Thrombocytopenia -In the setting of Chemotherapy; Platelet Count is now trending back up and at 60. -Patient with no signs of bleeding.   -Lovenox discontinued.   -Per oncology transfuse for platelet count less than 20.   Elevated BUN -In the setting of Steroids -BUN trending back down.  Follow.  -Continue PPI.  Leukocytosis/neutropenia -Likely reactive.  Leukocytosis trending down.  Patient now with a neutropenia with a white count of 0.1 from 0.6 and a ANC of 0.5.  -Patient was on IV antibiotics presumably for possible aspiration pneumonia.   -Discontinued IV antibiotics on 09/15/2018.   -Continue Granix daily until [facility] is greater than 1. -Per oncology.   Hypernatremia -Improved on  gentle hydration with D5W.  Discontinued D5W.  Follow.   Volume overload Likely secondary to high-grade lymphoma and hypoalbuminemia.  Status post IV albumin.  Patient was on IV Lasix.  Patient has received a total of 8 units of packed red blood cells during his hospitalization which may be contributing to volume overload.  Patient with a urine output of 3.2 L over the past 24 hours.  Patient received Lasix 40 mg IV every 12 hours x 4 doses on 09/17/2018 with a urine output of 4.9 L over the past 24 hours.  Patient is -11.337 L during this hospitalization.  Monitor for now.  Follow.  Hypokalemia Secondary to diuresis.  Repleted.    DVT prophylaxis: SCDs Code Status: Full Family Communication: Updated patient and daughter at bedside. Disposition Plan: Pending work-up and per oncology.   Consultants:   Hematology/oncology Dr. Burr Murillo  Procedures:   CT head 09/07/2018  CT angiogram chest 09/07/2018  Chest x-ray 09/07/2018, 09/09/2018, 09/11/2018, 09/14/2018  PICC line  R- CHOP   Repeat modified barium swallow 09/15/2018  Antimicrobials:   Unasyn 09/12/2018>>>>> 09/15/2018  IV Rocephin 09/07/2018>>>>> 09/12/2018  IV Flagyl 09/07/2018>>>>> 09/08/2018  Status post 4 units packed red blood cells.  2 units packed red blood cells 09/14/2018  2 units packed red blood cells 09/16/2018   Subjective: Patient laying in bed.  Still with cough with no significant change per patient.  Denies any shortness of breath.  Denies any chest pain.  Tolerating current diet.   Objective: Vitals:   09/18/18 1500 09/18/18 2031 09/19/18 0450 09/19/18 0900  BP: (!) 144/71 (!) 142/70 (!) 144/72   Pulse: 78 72 84   Resp: _0 Temp: 97.9 F (36.6 C) 98 F (36.7 C) 98.2 F (36.8 C) 99.5 F (37.5 C)  TempSrc: Oral Oral Oral Oral  SpO2: 98% 99% 98% 98%  Weight:      Height:        Intake/Output Summary (Last 24 hours) at 09/19/2018 1102 Last data filed at 09/19/2018 1012 Gross per 24 hour    Intake 360 ml  Output 4900 ml  Net -4540 ml   Filed Weights   09/13/18 0631 09/17/18 0023 09/18/18 0359  Weight: 86.7 kg 124.3 kg 126 kg    Examination:  General exam: NAD Eyes: Unequal pupil sizes. Respiratory system: Decreased breath sounds in the bases.  No crackles, no rhonchi, no wheezing.  Normal respiratory effort.  Cardiovascular system: Regular rate rhythm no murmurs rubs or gallops.  No JVD.  2-3+ left upper extremity edema.  Trace to 1+ bilateral lower extremity edema. Gastrointestinal system: Abdomen is soft, nontender, nondistended, positive bowel sounds.  No rebound.  No guarding.  Central nervous system: Alert and oriented.  No focal neurological deficits.  Moving extremities spontaneously.  Extremities: Symmetric 5 x 5 power. Skin: No rashes, lesions or ulcers Psychiatry: Judgement and insight appear normal. Mood & affect appropriate.     Data Reviewed: I have personally reviewed following labs and imaging studies  CBC: Recent Labs  Lab 09/14/18 0449  09/15/18 0555 09/15/18 1320 09/16/18 0500 09/17/18 0600 09/18/18 0422 09/19/18 0401  WBC 8.1  --  4.0  --  2.8* 1.6* 0.6* 0.1*  NEUTROABS 7.9*  --   --   --  2.6 1.4* 0.5*  --   HGB 7.1*   < > 7.4* 7.6* 6.7* 8.7* 8.9* 8.8*  HCT 22.8*   < > 23.2* 23.7* 21.3* 26.7* 26.8* 26.7*  MCV 93.4  --  91.3  --  91.0 88.1 86.5 87.5  PLT 56*  --  49*  --  44* 54* 59* 60*   < > = values in this interval not displayed.   Basic Metabolic Panel: Recent Labs  Lab 09/13/18 1133 09/14/18 0449 09/15/18 0555 09/16/18 0500 09/17/18 0600 09/18/18 0422 09/19/18 0401  NA 140 148* 143 141 142 141 140  K 4.7 3.8 3.9 3.9 3.5 3.1* 3.6  CL 110 116* 111 108 104 99 98  CO2 21* 21* _1 34* 35*  GLUCOSE 135* 105* 93 94 94 105* 110*  BUN 100* 74* 48* 32* _2 CREATININE 0.82 0.79 0.55 0.44 0.41* 0.38* 0.40*  CALCIUM 9.7 8.4* 9.1 9.2 9.1 8.9 8.7*  MG 2.7* 2.3  --   --   --  1.8 2.1  PHOS 5.7* 4.4  --   --   --   --    --    GFR: Estimated Creatinine Clearance: 87.6 mL/min (A) (by C-G formula based on SCr of 0.4 mg/dL (L)). Liver Function Tests: Recent Labs  Lab 09/13/18 1133 09/14/18 0449 09/15/18 0555 09/16/18 0500 09/19/18 0401  AST _3 ALT _4 ALKPHOS 59 50 42 43 61  BILITOT 1.3* 0.9 1.3* 1.2 2.1*  PROT 4.0* 3.6* 4.3* 5.0* 5.2*  ALBUMIN 2.5* 2.2* 2.9* 3.7  3.5   No results for input(s): LIPASE, AMYLASE in the last 168 hours. No results for input(s): AMMONIA in the last 168 hours. Coagulation Profile: No results for input(s): INR, PROTIME in the last 168 hours. Cardiac Enzymes: No results for input(s): CKTOTAL, CKMB, CKMBINDEX, TROPONINI in the last 168 hours. BNP (last 3 results) No results for input(s): PROBNP in the last 8760 hours. HbA1C: No results for input(s): HGBA1C in the last 72 hours. CBG: Recent Labs  Lab 09/16/18 0737  GLUCAP 84   Lipid Profile: No results for input(s): CHOL, HDL, LDLCALC, TRIG, CHOLHDL, LDLDIRECT in the last 72 hours. Thyroid Function Tests: No results for input(s): TSH, T4TOTAL, FREET4, T3FREE, THYROIDAB in the last 72 hours. Anemia Panel: Recent Labs    09/17/18 0925  FERRITIN 1,572*  TIBC 225*  IRON 39   Sepsis Labs: No results for input(s): PROCALCITON, LATICACIDVEN in the last 168 hours.  No results found for this or any previous visit (from the past 240 hour(s)).       Radiology Studies: Dg Chest 2 View  Result Date: 09/17/2018 CLINICAL DATA:  Cough. EXAM: CHEST - 2 VIEW COMPARISON:  Radiograph of September 14, 2018. FINDINGS: Stable cardiomediastinal silhouette. Large hiatal hernia is noted. No pneumothorax is noted. Right-sided pacemaker is unchanged in position. Mild bilateral pleural effusions are noted with associated atelectasis. Bony thorax is unremarkable. IMPRESSION: Stable large hiatal hernia. Mild bilateral pleural effusions are noted with associated atelectasis. Electronically Signed   By: Marijo Conception, M.D.   On: 09/17/2018 14:16        Scheduled Meds: . sodium chloride   Intravenous Once  . sodium chloride   Intravenous Once  . acyclovir  400 mg Oral BID  . allopurinol  300 mg Oral Daily  . cholecalciferol  2,000 Units Oral Daily  . famotidine  20 mg Oral Daily  . feeding supplement (ENSURE ENLIVE)  237 mL Oral BID BM  . fluticasone  2 spray Each Nare Daily  . folic acid  1 mg Oral Daily  . loratadine  10 mg Oral Daily  . mouth rinse  15 mL Mouth Rinse BID  . mupirocin ointment   Topical BID  . pantoprazole (PROTONIX) IV  40 mg Intravenous Q12H  . polyethylene glycol  17 g Oral Daily  . predniSONE  40 mg Oral Q breakfast  . senna-docusate  1 tablet Oral BID  . Tbo-filgastrim (GRANIX) SQ  480 mcg Subcutaneous Daily   Continuous Infusions: . sodium chloride Stopped (09/12/18 1805)  . sodium chloride       LOS: 12 days    Time spent: 40 mins    Irine Seal, MD [facility] Pager [phone]  If 7PM-7AM, please contact night-coverage [email or web address] Password [email or web address] 09/19/2018, 11:02 AM

## 2018-09-19 NOTE — Progress Notes (Signed)
Physical Therapy Treatment Patient Details Name: Patricia Murillo MRN: [id number] DOB: 19-Sep-1946 Today's Date: 09/19/2018    History of Present Illness 72 y.o. 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  During her hospitalization she underwent left axillary lymph node biopsy. Pt admitted with LUE edema, weakness, dyspnea. 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     PT Comments    Pt noticeably stronger today and tolerated increased activity level. +2 mod assist for sit to stand with RW for ~20 seconds, pt's LEs then buckled and she required +2 total assist to get her hips onto the bed (she had no contact with floor). Pt then refused bed to recliner transfer with lift 2* feeling "too shaken up" from LEs buckling, and fear of falling. Instructed pt in isometric LE strengthening exercises to be done independently. Encouraged pt to attempt bed to chair transfer with lift with nursing later today.    Follow Up Recommendations  SNF     Equipment Recommendations  Rolling walker with 5" wheels;3in1 (PT)    Recommendations for Other Services       Precautions / Restrictions Precautions Precautions: Fall Restrictions Weight Bearing Restrictions: No    Mobility  Bed Mobility Overal bed mobility: Needs Assistance Bed Mobility: Rolling Rolling: Min assist     Sit to supine: Max assist;+2 for physical assistance   General bed mobility comments: +2 max A to scoot up in bed, VCs for rolling technique, rolled L and R (L supervision, R min A for LUE)  Transfers Overall transfer level: Needs assistance Equipment used: Rolling walker (2 wheeled) Transfers: Sit to/from Stand Sit to Stand: From elevated surface;Mod assist;+2 physical assistance         General transfer comment: +2 mod assist to rise, VCs for hand and foot  placement; pt stood ~20 seconds, then BLEs buckled and pt required +2 max assist to get to the bed, pt refused transfer to recliner with lift (felt to "shaken up" from LEs buckling)  Ambulation/Gait             General Gait Details: unable   Stairs             Wheelchair Mobility    Modified Rankin (Stroke Patients Only)       Balance   Sitting-balance support: Bilateral upper extremity supported;Feet supported Sitting balance-Leahy Scale: Good Sitting balance - Comments: pt sat at edge of bed x 6 minutes   Standing balance support: Bilateral upper extremity supported Standing balance-Leahy Scale: Zero Standing balance comment: BLEs buckled after ~20 seconds standing                            Cognition Arousal/Alertness: Awake/alert Behavior During Therapy: WFL for tasks assessed/performed Overall Cognitive Status: Within Functional Limits for tasks assessed                                        Exercises General Exercises - Lower Extremity Ankle Circles/Pumps: AROM;Both;10 reps;Supine Quad Sets: AROM;Both;5 reps;Supine Gluteal Sets: AROM;Both;5 reps;Supine    General Comments        Pertinent Vitals/Pain Pain Assessment: No/denies pain    Home Living  Prior Function            PT Goals (current goals can now be found in the care plan section) Acute Rehab PT Goals Patient Stated Goal: to get stronger PT Goal Formulation: With patient/family Time For Goal Achievement: 09/25/18 Potential to Achieve Goals: Fair Progress towards PT goals: Progressing toward goals    Frequency    Min 2X/week      PT Plan Current plan remains appropriate    Co-evaluation              AM-PAC PT "6 Clicks" Mobility   Outcome Measure  Help needed turning from your back to your side while in a flat bed without using bedrails?: A Little Help needed moving from lying on your back to sitting on  the side of a flat bed without using bedrails?: A Lot Help needed moving to and from a bed to a chair (including a wheelchair)?: Total Help needed standing up from a chair using your arms (e.g., wheelchair or bedside chair)?: Total   Help needed climbing 3-5 steps with a railing? : Total 6 Click Score: 8    End of Session Equipment Utilized During Treatment: Gait belt Activity Tolerance: Patient limited by fatigue;Other (comment)(pt refused transfer to recliner with lift  2* fear of falling) Patient left: in bed;with call bell/phone within reach;with family/visitor present Nurse Communication: Need for lift equipment;Mobility status PT Visit Diagnosis: Other abnormalities of gait and mobility (R26.89);Muscle weakness (generalized) (M62.81)     Time: 7482-7078 PT Time Calculation (min) (ACUTE ONLY): 26 min  Charges:  $Therapeutic Exercise: 8-22 mins $Therapeutic Activity: 8-22 mins                     Blondell Reveal Kistler PT 09/19/2018  Acute Rehabilitation Services Pager [phone] Office [phone]

## 2018-09-19 NOTE — Care Management Important Message (Signed)
Important Message  Patient Details  Name: Patricia Murillo MRN: [id number] Date of Birth: 08-13-1946   Medicare Important Message Given:  Yes    Kerin Salen 09/19/2018, 1:41 PMImportant Message  Patient Details  Name: Patricia Murillo MRN: [id number] Date of Birth: 12-05-1945   Medicare Important Message Given:  Yes    Kerin Salen 09/19/2018, 1:41 PM

## 2018-09-19 NOTE — Care Management Important Message (Signed)
Important Message  Patient Details  Name: FLOYE FESLER MRN: [id number] Date of Birth: [birth year]   Medicare Important Message Given:  Yes    Kerin Salen 09/19/2018, 1:35 PMImportant Message  Patient Details  Name: SEMAJA LYMON MRN: [id number] Date of Birth: Nov 20, 1945   Medicare Important Message Given:  Yes    Kerin Salen 09/19/2018, 1:35 PM

## 2018-09-19 NOTE — Progress Notes (Signed)
Patricia Murillo   DOB:August 05, 1946   DE#:[id number]   [phone]  Hem/Onc follow up note   Subjective: I saw patient in the morning, she was sitting on the edge of bed, overall stable over the weekend, did require more blood transfusion, and has started Granix for her severe neutropenia.  No fever, T-max was nine 9.5 this morning.  He has a Foley in, still has dark brown/black stool 2-3 times a day, soft to loose, no other bleeding.   Objective:  Vitals:   09/19/18 0900 09/19/18 1340  BP:  122/74  Pulse:    Resp:  20  Temp: 99.5 F (37.5 C) 98.4 F (36.9 C)  SpO2: 98% 100%    Body mass index is 44.83 kg/m.  Intake/Output Summary (Last 24 hours) at 09/19/2018 1757 Last data filed at 09/19/2018 1712 Gross per 24 hour  Intake 360 ml  Output 3700 ml  Net -3340 ml     Sclerae unicteric  Oropharynx clear  (+) bulky adenopathy at left axilla and left upper front chest   Lungs clear -- scatter wheezing and rhonchi through both lungs   Heart regular rate and rhythm  Abdomen benign, soft   MSK no focal spinal tenderness, (+) severe edema of the left upper arm and leg, and bilateral lower extremity  Neuro: alert, and follow commands. Pupil right bigger than left, both reactive, mild (+) ptosis of left eye. Her left upper extremity strength is significantly reduced. She moves all extremities    CBG (last 3)  No results for input(s): GLUCAP in the last 72 hours.   Labs:  CBC Latest Ref Rng & Units 09/19/2018 09/18/2018 09/17/2018  WBC 4.0 - 10.5 K/uL 0.1(LL) 0.6(LL) 1.6(L)  Hemoglobin 12.0 - 15.0 g/dL 8.8(L) 8.9(L) 8.7(L)  Hematocrit 36.0 - 46.0 % 26.7(L) 26.8(L) 26.7(L)  Platelets 150 - 400 K/uL 60(L) 59(L) 54(L)    CMP Latest Ref Rng & Units 09/19/2018 09/18/2018 09/17/2018  Glucose 70 - 99 mg/dL 110(H) 105(H) 94  BUN 8 - 23 mg/dL 15 17 23   Creatinine 0.44 - 1.00 mg/dL 0.40(L) 0.38(L) 0.41(L)  Sodium 135 - 145 mmol/L 140 141 142  Potassium 3.5 - 5.1 mmol/L 3.6 3.1(L) 3.5   Chloride 98 - 111 mmol/L 98 99 104  CO2 22 - 32 mmol/L 35(H) 34(H) 29  Calcium 8.9 - 10.3 mg/dL 8.7(L) 8.9 9.1  Total Protein 6.5 - 8.1 g/dL 5.2(L) - -  Total Bilirubin 0.3 - 1.2 mg/dL 2.1(H) - -  Alkaline Phos 38 - 126 U/L 61 - -  AST 15 - 41 U/L 23 - -  ALT 0 - 44 U/L 20 - -     Urine Studies No results for input(s): UHGB, CRYS in the last 72 hours.  Invalid input(s): UACOL, UAPR, USPG, UPH, UTP, UGL, UKET, UBIL, UNIT, UROB, [place], UEPI, UWBC, Duwayne Heck [place], [place]  Basic Metabolic Panel: Recent Labs  Lab 09/13/18 1133 09/14/18 0449 09/15/18 0555 09/16/18 0500 09/17/18 0600 09/18/18 0422 09/19/18 0401  NA 140 148* 143 141 142 141 140  K 4.7 3.8 3.9 3.9 3.5 3.1* 3.6  CL 110 116* 111 108 104 99 98  CO2 21* 21* 26 28 29  34* 35*  GLUCOSE 135* 105* 93 94 94 105* 110*  BUN 100* 74* 48* 32* 23 17 15   CREATININE 0.82 0.79 0.55 0.44 0.41* 0.38* 0.40*  CALCIUM 9.7 8.4* 9.1 9.2 9.1 8.9 8.7*  MG 2.7* 2.3  --   --   --  1.8  2.1  PHOS 5.7* 4.4  --   --   --   --   --    GFR Estimated Creatinine Clearance: 87.6 mL/min (A) (by C-G formula based on SCr of 0.4 mg/dL (L)). Liver Function Tests: Recent Labs  Lab 09/13/18 1133 09/14/18 0449 09/15/18 0555 09/16/18 0500 09/19/18 0401  AST 26 20 21 23 23   ALT 15 11 14 14 20   ALKPHOS 59 50 42 43 61  BILITOT 1.3* 0.9 1.3* 1.2 2.1*  PROT 4.0* 3.6* 4.3* 5.0* 5.2*  ALBUMIN 2.5* 2.2* 2.9* 3.7 3.5   No results for input(s): LIPASE, AMYLASE in the last 168 hours. No results for input(s): AMMONIA in the last 168 hours. Coagulation profile No results for input(s): INR, PROTIME in the last 168 hours.  CBC: Recent Labs  Lab 09/14/18 0449  09/15/18 0555 09/15/18 1320 09/16/18 0500 09/17/18 0600 09/18/18 0422 09/19/18 0401  WBC 8.1  --  4.0  --  2.8* 1.6* 0.6* 0.1*  NEUTROABS 7.9*  --   --   --  2.6 1.4* 0.5*  --   HGB 7.1*   < > 7.4* 7.6* 6.7* 8.7* 8.9* 8.8*  HCT 22.8*   < > 23.2* 23.7* 21.3* 26.7* 26.8* 26.7*  MCV 93.4   --  91.3  --  91.0 88.1 86.5 87.5  PLT 56*  --  49*  --  44* 54* 59* 60*   < > = values in this interval not displayed.   Cardiac Enzymes: No results for input(s): CKTOTAL, CKMB, CKMBINDEX, TROPONINI in the last 168 hours. BNP: Invalid input(s): POCBNP CBG: Recent Labs  Lab 09/16/18 0737  GLUCAP 84   D-Dimer No results for input(s): DDIMER in the last 72 hours. Hgb A1c No results for input(s): HGBA1C in the last 72 hours. Lipid Profile No results for input(s): CHOL, HDL, LDLCALC, TRIG, CHOLHDL, LDLDIRECT in the last 72 hours. Thyroid function studies No results for input(s): TSH, T4TOTAL, T3FREE, THYROIDAB in the last 72 hours.  Invalid input(s): FREET3 Anemia work up Recent Labs    09/17/18 0925  FERRITIN 1,572*  TIBC 225*  IRON 39   Microbiology No results found for this or any previous visit (from the past 240 hour(s)).    Studies:  No results found.  Assessment: 72 y.o. with newly diagnosed high-grade B-cell lymphoma, recently discharged from hospital after biopsy, presented to the emergency room with progressive weakness, dyspnea, chills and fever, anorexia, and extremity edema.  1. Severe neutropenia, secondary to chemo, granix started on 12/15 2.  Acute hypoxic respite failure, secondary to lymphoma in lungs, versus infections, improving  3.  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 Worsening anemia secondary to chemo, probable mild GI bleeding  6.  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 Hypercalcemia secondary to malignancy, s/p zometa 12/5, resolved now  8. Severe arm and leg edema, secondary to severe adenopathy and hypoalbuminemia, improving  9. Deconditioning  10 left ptosis and dilated right  pupil  11.  thrombocytopenia, secondary to lymphoma and chemotherapy, stable 12. Volume overload, received aggressive iv last yesterday   Plan:  -We will continue Granix injection daily until [place] above 1K -She is at very high risk for infections, will have low threshold for intravenous broad  antibiotics if she spikes fever -Hemoglobin and platelet count stable today, no need blood transfusion -Continue physical therapy, patient is open to short-term rehab now -I encourage her to participate PT. Due to her foley and large urine output, she is not allowed to get out bed for now, hopefully her foley will be ready to removed soon  -continue f/u closely, appreciate the excellent care from Dr. Encarnacion Chu  09/19/2018

## 2018-09-20 DIAGNOSIS — R0902 Hypoxemia: Secondary | ICD-10-CM

## 2018-09-20 LAB — CBC WITH DIFFERENTIAL/PLATELET
HCT: 25.2 % — ABNORMAL LOW (ref 36.0–46.0)
Hemoglobin: 8.2 g/dL — ABNORMAL LOW (ref 12.0–15.0)
MCH: 28.9 pg (ref 26.0–34.0)
MCHC: 32.5 g/dL (ref 30.0–36.0)
MCV: 88.7 fL (ref 80.0–100.0)
Platelets: 64 10*3/uL — ABNORMAL LOW (ref 150–400)
RBC: 2.84 MIL/uL — ABNORMAL LOW (ref 3.87–5.11)
RDW: 15.9 % — ABNORMAL HIGH (ref 11.5–15.5)
WBC: 0.1 10*3/uL — AB (ref 4.0–10.5)
nRBC: 0 % (ref 0.0–0.2)

## 2018-09-20 LAB — COMPREHENSIVE METABOLIC PANEL
ALT: 18 U/L (ref 0–44)
AST: 16 U/L (ref 15–41)
Albumin: 3.2 g/dL — ABNORMAL LOW (ref 3.5–5.0)
Alkaline Phosphatase: 61 U/L (ref 38–126)
Anion gap: 8 (ref 5–15)
BUN: 13 mg/dL (ref 8–23)
CHLORIDE: 98 mmol/L (ref 98–111)
CO2: 29 mmol/L (ref 22–32)
Calcium: 8.4 mg/dL — ABNORMAL LOW (ref 8.9–10.3)
Creatinine, Ser: 0.36 mg/dL — ABNORMAL LOW (ref 0.44–1.00)
GFR calc Af Amer: >60 mL/min (ref >60)
GFR calc non Af Amer: >60 mL/min (ref >60)
Glucose, Bld: 110 mg/dL — ABNORMAL HIGH (ref 70–99)
Potassium: 3.8 mmol/L (ref 3.5–5.1)
Sodium: 135 mmol/L (ref 135–145)
Total Bilirubin: 2.1 mg/dL — ABNORMAL HIGH (ref 0.3–1.2)
Total Protein: 5 g/dL — ABNORMAL LOW (ref 6.5–8.1)

## 2018-09-20 NOTE — Progress Notes (Signed)
Patricia Murillo   DOB:12/28/1945   KZ#:[id number]   TDD#:[id number]  Hem/Onc follow up note   Subjective: Pt complained headache this morning and received a dose of morphine, more drawsy   Objective:  Vitals:   09/20/18 1231 09/20/18 1354  BP:  130/75  Pulse:  99  Resp:  16  Temp: 100 F (37.8 C) 98.5 F (36.9 C)  SpO2:  96%    Body mass index is 44.83 kg/m.  Intake/Output Summary (Last 24 hours) at 09/20/2018 1507 Last data filed at 09/20/2018 0630 Gross per 24 hour  Intake 120 ml  Output 1300 ml  Net -1180 ml     Sclerae unicteric  Oropharynx clear  (+) bulky adenopathy at left axilla and left upper front chest have improved   Lungs clear -- scatter wheezing and rhonchi through both lungs   Heart regular rate and rhythm  Abdomen benign, soft   MSK no focal spinal tenderness, (+) severe edema of the left upper arm and leg, and bilateral lower extremity  Neuro: alert, and follow commands. Pupil right bigger than left, both reactive, mild (+) ptosis of left eye. Her left upper extremity strength is significantly reduced. She moves all extremities    CBG (last 3)  No results for input(s): GLUCAP in the last 72 hours.   Labs:  CBC Latest Ref Rng & Units 09/20/2018 09/19/2018 09/18/2018  WBC 4.0 - 10.5 K/uL 0.1(LL) 0.1(LL) 0.6(LL)  Hemoglobin 12.0 - 15.0 g/dL 8.2(L) 8.8(L) 8.9(L)  Hematocrit 36.0 - 46.0 % 25.2(L) 26.7(L) 26.8(L)  Platelets 150 - 400 K/uL 64(L) 60(L) 59(L)    CMP Latest Ref Rng & Units 09/20/2018 09/19/2018 09/18/2018  Glucose 70 - 99 mg/dL 110(H) 110(H) 105(H)  BUN 8 - 23 mg/dL 13 15 17   Creatinine 0.44 - 1.00 mg/dL 0.36(L) 0.40(L) 0.38(L)  Sodium 135 - 145 mmol/L 135 140 141  Potassium 3.5 - 5.1 mmol/L 3.8 3.6 3.1(L)  Chloride 98 - 111 mmol/L 98 98 99  CO2 22 - 32 mmol/L 29 35(H) 34(H)  Calcium 8.9 - 10.3 mg/dL 8.4(L) 8.7(L) 8.9  Total Protein 6.5 - 8.1 g/dL 5.0(L) 5.2(L) -  Total Bilirubin 0.3 - 1.2 mg/dL 2.1(H) 2.1(H) -  Alkaline Phos 38 -  126 U/L 61 61 -  AST 15 - 41 U/L 16 23 -  ALT 0 - 44 U/L 18 20 -     Urine Studies No results for input(s): UHGB, CRYS in the last 72 hours.  Invalid input(s): UACOL, UAPR, USPG, UPH, UTP, UGL, UKET, UBIL, UNIT, UROB, ULEU, UEPI, UWBC, [place], [place], [place], [place], [place]  Basic Metabolic Panel: Recent Labs  Lab 09/14/18 0449  09/16/18 0500 09/17/18 0600 09/18/18 0422 09/19/18 0401 09/20/18 0450  NA 148*   < > 141 142 141 140 135  K 3.8   < > 3.9 3.5 3.1* 3.6 3.8  CL 116*   < > 108 104 99 98 98  CO2 21*   < > 28 29 34* 35* 29  GLUCOSE 105*   < > 94 94 105* 110* 110*  BUN 74*   < > 32* 23 17 15 13   CREATININE 0.79   < > 0.44 0.41* 0.38* 0.40* 0.36*  CALCIUM 8.4*   < > 9.2 9.1 8.9 8.7* 8.4*  MG 2.3  --   --   --  1.8 2.1  --   PHOS 4.4  --   --   --   --   --   --    < > =  values in this interval not displayed.   GFR Estimated Creatinine Clearance: 87.6 mL/min (A) (by C-G formula based on SCr of 0.36 mg/dL (L)). Liver Function Tests: Recent Labs  Lab 09/14/18 0449 09/15/18 0555 09/16/18 0500 09/19/18 0401 09/20/18 0450  AST 20 21 23 23 16   ALT 11 14 14 20 18   ALKPHOS 50 42 43 61 61  BILITOT 0.9 1.3* 1.2 2.1* 2.1*  PROT 3.6* 4.3* 5.0* 5.2* 5.0*  ALBUMIN 2.2* 2.9* 3.7 3.5 3.2*   No results for input(s): LIPASE, AMYLASE in the last 168 hours. No results for input(s): AMMONIA in the last 168 hours. Coagulation profile No results for input(s): INR, PROTIME in the last 168 hours.  CBC: Recent Labs  Lab 09/14/18 0449  09/16/18 0500 09/17/18 0600 09/18/18 0422 09/19/18 0401 09/20/18 0450  WBC 8.1   < > 2.8* 1.6* 0.6* 0.1* 0.1*  NEUTROABS 7.9*  --  2.6 1.4* 0.5*  --   --   HGB 7.1*   < > 6.7* 8.7* 8.9* 8.8* 8.2*  HCT 22.8*   < > 21.3* 26.7* 26.8* 26.7* 25.2*  MCV 93.4   < > 91.0 88.1 86.5 87.5 88.7  PLT 56*   < > 44* 54* 59* 60* 64*   < > = values in this interval not displayed.   Cardiac Enzymes: No results for input(s): CKTOTAL, CKMB, CKMBINDEX, TROPONINI in  the last 168 hours. BNP: Invalid input(s): POCBNP CBG: Recent Labs  Lab 09/16/18 0737  GLUCAP 84   D-Dimer No results for input(s): DDIMER in the last 72 hours. Hgb A1c No results for input(s): HGBA1C in the last 72 hours. Lipid Profile No results for input(s): CHOL, HDL, LDLCALC, TRIG, CHOLHDL, LDLDIRECT in the last 72 hours. Thyroid function studies No results for input(s): TSH, T4TOTAL, T3FREE, THYROIDAB in the last 72 hours.  Invalid input(s): FREET3 Anemia work up No results for input(s): VITAMINB12, FOLATE, FERRITIN, TIBC, IRON, RETICCTPCT in the last 72 hours. Microbiology No results found for this or any previous visit (from the past 240 hour(s)).    Studies:  No results found.  Assessment: 72 y.o. with newly diagnosed high-grade B-cell lymphoma, recently discharged from hospital after biopsy, presented to the emergency room with progressive weakness, dyspnea, chills and fever, anorexia, and extremity edema.  1. Severe neutropenia, secondary to chemo, granix started on 12/15 2.  Acute hypoxic respite failure, secondary to lymphoma in lungs, versus infections, stable, she is on 2L [place] oxygen  3.  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 Worsening anemia secondary to chemo, probable mild GI bleeding  6.  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 Hypercalcemia secondary to malignancy, s/p zometa 12/5, resolved now  8. Severe arm and leg edema, secondary to severe adenopathy and hypoalbuminemia, improving  9. Deconditioning  10 left ptosis and dilated right pupil  11.  thrombocytopenia, secondary to lymphoma and chemotherapy, improving  12. Volume overload, received aggressive iv last on 12/15   Plan:  -We will continue Granix  injection daily until [place] above 1K -She is at very high risk for infections, will have low threshold for intravenous broad antibiotics if she spikes fever -H/H trending down again, TBIL has slightly elevated again, will do LDH and ret count tomorrow morning, if her hemolysis is getting  worse, I will increase her steroids dose -continue supportive care, appreciate the excellent care from the hospitalist team  -her biopsy FISH test is still pending, I called pathology today    Truitt Merle  09/20/2018

## 2018-09-20 NOTE — Progress Notes (Signed)
Nutrition Follow-up  DOCUMENTATION CODES:   Non-severe (moderate) malnutrition in context of acute illness/injury  INTERVENTION:   Continue Ensure Enlive po BID, each supplement provides 350 kcal and 20 grams of protein  NUTRITION DIAGNOSIS:   Moderate Malnutrition related to acute illness, poor appetite as evidenced by percent weight loss, energy intake < or equal to 75% for > or equal to 1 month, mild muscle depletion.  Ongoing.  GOAL:   Patient will meet greater than or equal to 90% of their needs  Progressing.  MONITOR:   PO intake, Supplement acceptance, Labs, Weight trends, I & O's  ASSESSMENT:   72 y.o. 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  During her hospitalization she underwent left axillary lymph node biopsy.  She was discharged home in a stable condition, but apparently she has been developing worsening weakness, generalized, worse with exertion, no improving factors, associated with malaise, chills and poor appetite.  Pt currently consuming 25-65% of meals over the past few days. Pt is not drinking Ensure supplements consistently but this could be pt is eating more of her meals.   Per weight records, pt has gained 86 lb of fluid since 12/10. Still presents with severe edema in extremities.  Labs reviewed. Medications: Vitamin D tablet daily, folic acid tablet daily, Senokot-S tablet BID, IV Zofran PRN  Diet Order:   Diet Order            Diet regular Room service appropriate? Yes; Fluid consistency: Thin  Diet effective now              EDUCATION NEEDS:   Education needs have been addressed  Skin:  Skin Assessment: Reviewed RN Assessment  Last BM:  12/17  Height:   Ht Readings from Last 1 Encounters:  09/08/18 5\' 6"  (1.676 m)    Weight:   Wt Readings from Last 1 Encounters:  09/20/18 126 kg     Ideal Body Weight:  59.1 kg  BMI:  Body mass index is 44.83 kg/m.  Estimated Nutritional Needs:   Kcal:  1700-1900  Protein:  90-100g  Fluid:  1.7L/day  Clayton Bibles, MS, RD, LDN [place] Dietitian Pager: [phone] After Hours Pager: [phone]

## 2018-09-20 NOTE — Progress Notes (Signed)
Social worker called and requested AD forms for this patient. Her daughter Lannette Donath is with her.  I took forms to them and went over the pages to complete and when ready to call the chaplain to come and have notary and witnesses to finalize.  (if they want it completed today will need to call before 3 as volunteers leave about 3)   Chaplain offered to pray with them and they were very open and receptive.  Prayers for peace in mind and heart and for staff as they provide care and for family during this time. Conard Novak, Chaplain   09/20/18 1000  Clinical Encounter Type  Visited With Patient and family together  Visit Type Initial;Spiritual support;Other (Comment) (Requested AD forms)  Referral From Social work  Consult/Referral To Clinical biochemist  Spiritual Encounters  Spiritual Needs Literature;Prayer  Stress Factors  Patient Stress Factors Other (Comment) (Requested AD forms)  Family Stress Factors Other (Comment) (Requested AD forms)

## 2018-09-20 NOTE — Progress Notes (Signed)
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 She was discharged home in a stable condition, but apparently she has been developing worsening weakness, generalized, worse with exertion, no improving factors, associated with malaise, chills and poor appetite. Over the last 24 hours her symptoms have been more severe to the point where she has difficulty standing. She has persistent left upper extremity edema which has been painful, sharp in nature up to 10 out of 10 intensity. Her weakness has led to dyspnea which has been severe in intensity,and triggered her emergency room visit today.  ED Course:Patient was found ill looking appearing, febrile, positive leukocytosis, concern for systemic infection, referred for admission for evaluation.  **Oncology concerned about her progression of her high-grade B-cell lymphoma and is planning on starting chemotherapy today and have ordered a PICC line placement which was done by IR Dr. Annamaria Boots.  Sepsis physiology is improved.  Patient still remains hypercalcemic so oncology gave the patient Zometa and continued with IV fluidsbut have now discontinued. She started her Rituxan the day before yesterday but did not complete it because she had some mild infusion reactions.  The completion of her chemotherapy yesterday and this morning she is doing well but then she started coughing with her medication became slightly altered and confused after pain medication.  She is made n.p.o. and SLP evaluated and did an MBS which showed patient is aspirating.  SLP recommended full liquid diet with known  aspiration risks and will discuss with the family about pain to tube and tube feedings.  Patient's hemoglobin dropped so we will give another unit of PRBCs and will also give another dose of IV Lasix.  Will change antibiotics to IV Unasyn.    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 Likely related to progressive high-grade B-cell lymphoma with pulmonary involvement comp located by suspected aspiration - ?? Etiology.  Likely secondary to progressive high-grade B-cell lymphoma with pulmonary involvement.  There was also concern for suspected aspiration pneumonia.  Patient with complaints of cough. -IVF discontinued and received Lasix -IV antibiotic therapy withIVceftriaxone and IV Metronidazole was started.  Patient status post IV Rocephin.  IV Flagyl discontinued.  Patient was on IV Unasyn.  Repeat chest x-ray negative for any acute infiltrate however concerning for persistent bilateral pleural effusions.  -Discontinued IV Unasyn after 09/14/2018's dose as patient s/p 8 days of antibiotic treatment. -Patient seen by speech therapy and noted to be high aspiration risk and patient started on a full liquid diet.   -Panda was attempted per RN however patient unable to tolerate.   -Chest x-ray done and showed stable chest x-ray demonstrating pulmonary lymphoma and bilateral pleural effusions. -Patient received doses of IV Lasix in between blood transfusions with a urine output of 4.9 L overnight.  Patient is -11.937 L during this hospitalization..   -Patient underwent repeat modified barium swallow with good results.  Patient currently on a regular diet with aspiration precautions and tolerating.  -Due to  ongoing cough Claritin, Flonase, PPI added. Patient also noted to be volume overloaded on examination.  Patient placed on Lasix 40 mg IV every 12 hours x4 doses.  -Follow.   High-grade B-cell Lymphoma -CT head unremarkable.   -Patient with left upper extremity pain and swelling likely secondary to high-grade B-cell lymphoma.  Left upper extremity swelling slowly improving. -Oncology consulted and patient started on R-CHOP and patient to got Rituxan and CHOP on different days -Oncology Dr. Burr Medico does not recommend a bone marrow biopsy as this would not change restaging imaging and management -Dr. Burr Medico has ordered [facility] to rule out double hit or triple hit high-grade lymphoma -PICC placed. -Solumedrol started for Hemolysis and now on po Prednisone -Rituxan was initiated, but unable to complete due to infusion reactions so will get repeat treatment with Benadryl, Tylenol and Steroids and remainder of Rituximab and CHOP received chemotherapy on 09/10/18. -Pain Control with Acetaminophen, Hydrocodone-Acetaminophen 1 tab po q6hprn, and IV Morphine 2 mg q2hprn -With her Anasarca from Cancer she is going to be given IV Lasix from Oncology and IV fluids have now been stopped; patient status post packed red blood cells.   -Due to low albumin levels patient given IV albumin. -Placed on IV Lasix 40 mg IV every 12 hours x4 doses due to volume overload on 09/17/2018.  -Per oncology.   Acute encephalopathy likely in the setting of hypercalcemia, Hypercalcemia is improving. -Clinically improving daily.  -Head CT noncontrast showed no evidence of any acute intracranial abnormality.  There is mild chronic small vessel ischemic changes in the cerebral white matter.  There is also mild chronic appearing paranasal sinusitis. -Oncology recommended MRI to rule out CNS lymphoma however unable to be done as history of pacemaker placement.  Also concerns for aspiration pneumonia.  -Patient seen by speech therapy and  patient underwent MBS which showed high risk of aspiration.  -Panda tube ordered however patient unable to tolerate.   -Patient underwent repeat modified barium swallow with good results.  Patient currently on regular diet with aspiration precautions and tolerating current diet.  -Continue monitor her mental status -Per oncology if worsening mental status may need an LP to rule out CNS lymphoma.  Hypercalcemia likely of Malignancy -Calcium level was  8.4 today and corrected calcium 9.04. -Patient was given zoledronic acid IV 4 mg once 09/08/2018 per oncology -Continue to Monitor   Iron deficiency anemia/Hemolysis from [facility] -Patient's hemoglobin/hematocrit went from 8.2/27.5 -> 6.9/24.2>>> 7.1>>>> 6.7>>>> 7.4>>>> 7.6>>>> 6.7>>>>8.7>>> 8.9>>> 8.8>>> 8.2 -Patient status post transfusion of 8 units packed red blood cells.   -Hemoglobin is 8.2 from 8.8 from 8.9 from 8.7 from 6.7 from 7.4 the morning of 09/15/2018.   -Patient denies any overt bleeding.   -FOBT was positive.   -Per oncology secondary to chemotherapy and hemolysis.  LDH trending down.  Bilirubin trending down.  Transfusion threshold hemoglobin less than 7.5.  -Total bilirubin at 1.2.  Last LDH was 176 from 309.  LDH trending down. -Patient with no overt signs of bleeding. -Patient on steroids per oncology. -Continue oral iron supplementation. - Follow. -Oncology following.  Third-degree AV block s/p pacemaker -Patient currently has a V paced rhythm.  Obesity -Estimated body mass index is 30.85 kg/m as calculated from the following:   Height as of this encounter: 5' 6"  (1.676 m).   Weight as of this encounter: 86.7 kg. -Weight Loss Counseling given  Non-Severe (moderate) malnutrition  in the context of acute illness/injury -Nutrition is consulted for further evaluation recommendations -Continue with Ensure Enlive p.o. twice daily -Panda to was attempted early on today however unsuccessful as patient unable to  tolerate tube placement.   -Repeat modified barium swallow done with improvement. -Patient now on a regular diet with aspiration precautions. -Patient was status post IV albumin.   -Appetite improving.   Left Arm Weakness and Swelling -Likely secondary to high-grade lymphoma.   -Patient status post upper extremity Dopplers done 08/29/2018 which were negative for DVT.   -CT angiogram chest done 2019 with enlargement of left axillary, subpectoral chest wall lymph node masses with encasement of left subclavian and axillary vascular structures.  Enlargement of right hilar and mediastinal lymph nodes since prior study.  Normal arterial patency demonstrated of the left subclavian and axillary arteries.  Patient still with significant swelling left upper extremity despite diuretics and IV albumin however slightly improved since last week.  Status post IV albumin.  Hemoglobin at 8.2 this morning.  Patient received Lasix 40 mg IV every 12 hours x 4 doses on 09/17/2018.  Strict I's and O's.  Daily weights.   Pupil Discrepancy -Noted to be new -No Vision loss -Head CT Negative -Continue to Monitor   Thrombocytopenia -In the setting of Chemotherapy; Platelet Count is now trending back up and at 64. -Patient with no signs of bleeding.   -Lovenox discontinued.   -Per oncology transfuse for platelet count less than 20.   Elevated BUN -In the setting of Steroids -BUN trending back down.  Follow.  -Continue PPI.  Leukocytosis/neutropenia -Likely reactive.  Leukocytosis trending down.  Patient now with a neutropenia with a white count of 0.1 from 0.1 from 0.6 and a ANC of 0.5.  -Patient was on IV antibiotics presumably for possible aspiration pneumonia.   -Discontinued IV antibiotics on 09/15/2018.   -Continue Granix daily until [facility] is greater than 1. -If patient spikes a fever due to her neutropenia will place empirically on IV antibiotics.  Per oncology.   Hypernatremia -Improved on gentle  hydration with D5W.  Discontinued D5W.  Follow.   Volume overload Likely secondary to high-grade lymphoma and hypoalbuminemia.  Status post IV albumin.  Patient s/p IV Lasix.  Patient has received a total of 8 units of packed red blood cells during his hospitalization which may be contributing to volume overload.  Patient with a urine output of 1.3 L over the past 24 hours.  Patient received Lasix 40 mg IV every 12 hours x 4 doses on 09/17/2018.  Patient is -11.937 L during this hospitalization.  Monitor for now.  Follow.  Hypokalemia Secondary to diuresis.  Repleted.    DVT prophylaxis: SCDs Code Status: Full Family Communication: Updated patient and daughter at bedside. Disposition Plan: Pending work-up and per oncology.   Consultants:   Hematology/oncology Dr. Burr Medico  Procedures:   CT head 09/07/2018  CT angiogram chest 09/07/2018  Chest x-ray 09/07/2018, 09/09/2018, 09/11/2018, 09/14/2018  PICC line  R- CHOP   Repeat modified barium swallow 09/15/2018  Antimicrobials:   Unasyn 09/12/2018>>>>> 09/15/2018  IV Rocephin 09/07/2018>>>>> 09/12/2018  IV Flagyl 09/07/2018>>>>> 09/08/2018  Status post 4 units packed red blood cells.  2 units packed red blood cells 09/14/2018  2 units packed red blood cells 09/16/2018   Subjective: Patient laying in bed seems drowsy.  Opens eyes to verbal stimuli and answers questions.  Patient received some IV morphine.  Denies any shortness of breath.  States heartburn has improved.  Tolerating  diet.   Objective: Vitals:   09/19/18 2049 09/20/18 0500 09/20/18 0612 09/20/18 0834  BP: (!) 158/65  (!) 152/82   Pulse: 82  80   Resp: 17  17   Temp: 97.6 F (36.4 C)  98.6 F (37 C) 99.1 F (37.3 C)  TempSrc: Oral  Oral Oral  SpO2: 99%  98%   Weight:  126 kg    Height:        Intake/Output Summary (Last 24 hours) at 09/20/2018 1224 Last data filed at 09/20/2018 0630 Gross per 24 hour  Intake 240 ml  Output 1300 ml  Net -1060 ml    Filed Weights   09/17/18 0023 09/18/18 0359 09/20/18 0500  Weight: 124.3 kg 126 kg 126 kg    Examination:  General exam: Drowsy. Eyes: Unequal pupil sizes. Respiratory system: Decreased breath sounds in the bases otherwise clear.  No crackles, no rhonchi, no wheezing. Cardiovascular system: RRR no murmurs rubs or gallops.  No JVD. 2-3+ left upper extremity edema.  Trace bilateral lower extremity edema. Gastrointestinal system: Abdomen is nontender, nondistended, soft, positive bowel sounds.  No rebound.  No guarding.  Central nervous system: Alert and oriented.  No focal neurological deficits.  Moving extremities spontaneously.  Extremities: Symmetric 5 x 5 power. Skin: No rashes, lesions or ulcers Psychiatry: Judgement and insight appear normal. Mood & affect appropriate.     Data Reviewed: I have personally reviewed following labs and imaging studies  CBC: Recent Labs  Lab 09/14/18 0449  09/16/18 0500 09/17/18 0600 09/18/18 0422 09/19/18 0401 09/20/18 0450  WBC 8.1   < > 2.8* 1.6* 0.6* 0.1* 0.1*  NEUTROABS 7.9*  --  2.6 1.4* 0.5*  --   --   HGB 7.1*   < > 6.7* 8.7* 8.9* 8.8* 8.2*  HCT 22.8*   < > 21.3* 26.7* 26.8* 26.7* 25.2*  MCV 93.4   < > 91.0 88.1 86.5 87.5 88.7  PLT 56*   < > 44* 54* 59* 60* 64*   < > = values in this interval not displayed.   Basic Metabolic Panel: Recent Labs  Lab 09/14/18 0449  09/16/18 0500 09/17/18 0600 09/18/18 0422 09/19/18 0401 09/20/18 0450  NA 148*   < > 141 142 141 140 135  K 3.8   < > 3.9 3.5 3.1* 3.6 3.8  CL 116*   < > 108 104 99 98 98  CO2 21*   < > 28 29 34* 35* 29  GLUCOSE 105*   < > 94 94 105* 110* 110*  BUN 74*   < > 32* 23 17 15 13   CREATININE 0.79   < > 0.44 0.41* 0.38* 0.40* 0.36*  CALCIUM 8.4*   < > 9.2 9.1 8.9 8.7* 8.4*  MG 2.3  --   --   --  1.8 2.1  --   PHOS 4.4  --   --   --   --   --   --    < > = values in this interval not displayed.   GFR: Estimated Creatinine Clearance: 87.6 mL/min (A) (by C-G  formula based on SCr of 0.36 mg/dL (L)). Liver Function Tests: Recent Labs  Lab 09/14/18 0449 09/15/18 0555 09/16/18 0500 09/19/18 0401 09/20/18 0450  AST 20 21 23 23 16   ALT 11 14 14 20 18   ALKPHOS 50 42 43 61 61  BILITOT 0.9 1.3* 1.2 2.1* 2.1*  PROT 3.6* 4.3* 5.0* 5.2* 5.0*  ALBUMIN 2.2* 2.9* 3.7 3.5  3.2*   No results for input(s): LIPASE, AMYLASE in the last 168 hours. No results for input(s): AMMONIA in the last 168 hours. Coagulation Profile: No results for input(s): INR, PROTIME in the last 168 hours. Cardiac Enzymes: No results for input(s): CKTOTAL, CKMB, CKMBINDEX, TROPONINI in the last 168 hours. BNP (last 3 results) No results for input(s): PROBNP in the last 8760 hours. HbA1C: No results for input(s): HGBA1C in the last 72 hours. CBG: Recent Labs  Lab 09/16/18 0737  GLUCAP 84   Lipid Profile: No results for input(s): CHOL, HDL, LDLCALC, TRIG, CHOLHDL, LDLDIRECT in the last 72 hours. Thyroid Function Tests: No results for input(s): TSH, T4TOTAL, FREET4, T3FREE, THYROIDAB in the last 72 hours. Anemia Panel: No results for input(s): VITAMINB12, FOLATE, FERRITIN, TIBC, IRON, RETICCTPCT in the last 72 hours. Sepsis Labs: No results for input(s): PROCALCITON, LATICACIDVEN in the last 168 hours.  No results found for this or any previous visit (from the past 240 hour(s)).       Radiology Studies: No results found.      Scheduled Meds: . sodium chloride   Intravenous Once  . sodium chloride   Intravenous Once  . acyclovir  400 mg Oral BID  . allopurinol  300 mg Oral Daily  . cholecalciferol  2,000 Units Oral Daily  . famotidine  20 mg Oral Daily  . feeding supplement (ENSURE ENLIVE)  237 mL Oral BID BM  . fluticasone  2 spray Each Nare Daily  . folic acid  1 mg Oral Daily  . loratadine  10 mg Oral Daily  . mouth rinse  15 mL Mouth Rinse BID  . mupirocin ointment   Topical BID  . pantoprazole (PROTONIX) IV  40 mg Intravenous Q12H  . polyethylene  glycol  17 g Oral Daily  . predniSONE  40 mg Oral Q breakfast  . senna-docusate  1 tablet Oral BID  . Tbo-filgastrim (GRANIX) SQ  480 mcg Subcutaneous Daily   Continuous Infusions: . sodium chloride Stopped (09/12/18 1805)  . sodium chloride       LOS: 13 days    Time spent: 40 mins    Irine Seal, MD [facility] Pager [phone]  If 7PM-7AM, please contact night-coverage [email or web address] Password [facility] 09/20/2018, 12:24 PM

## 2018-09-20 NOTE — Progress Notes (Signed)
Pt/daughter provided with bed offers for SNFs. Daughter going to research them today to make selection, then insurance authorization can be requested by facility.  Sharren Bridge, MSW, LCSW Clinical Social Work 09/20/2018 [phone]

## 2018-09-21 ENCOUNTER — Encounter (HOSPITAL_COMMUNITY): Payer: Self-pay | Admitting: Hematology

## 2018-09-21 ENCOUNTER — Inpatient Hospital Stay (HOSPITAL_COMMUNITY): Payer: Medicare HMO

## 2018-09-21 DIAGNOSIS — R5081 Fever presenting with conditions classified elsewhere: Secondary | ICD-10-CM

## 2018-09-21 DIAGNOSIS — D709 Neutropenia, unspecified: Secondary | ICD-10-CM

## 2018-09-21 DIAGNOSIS — R7881 Bacteremia: Secondary | ICD-10-CM

## 2018-09-21 DIAGNOSIS — T17908A Unspecified foreign body in respiratory tract, part unspecified causing other injury, initial encounter: Secondary | ICD-10-CM

## 2018-09-21 LAB — CBC WITH DIFFERENTIAL/PLATELET
Abs Immature Granulocytes: 0 10*3/uL (ref 0.00–0.07)
Basophils Absolute: 0 10*3/uL (ref 0.0–0.1)
Basophils Relative: 0 %
Eosinophils Absolute: 0 10*3/uL (ref 0.0–0.5)
Eosinophils Relative: 0 %
HEMATOCRIT: 24.5 % — AB (ref 36.0–46.0)
Hemoglobin: 8 g/dL — ABNORMAL LOW (ref 12.0–15.0)
Immature Granulocytes: 0 %
Lymphocytes Relative: 8 %
Lymphs Abs: 0 10*3/uL — ABNORMAL LOW (ref 0.7–4.0)
MCH: 28.6 pg (ref 26.0–34.0)
MCHC: 32.7 g/dL (ref 30.0–36.0)
MCV: 87.5 fL (ref 80.0–100.0)
MONOS PCT: 67 %
Monocytes Absolute: 0.2 10*3/uL (ref 0.1–1.0)
Neutro Abs: 0.1 10*3/uL — ABNORMAL LOW (ref 1.7–7.7)
Neutrophils Relative %: 25 %
Platelets: 111 10*3/uL — ABNORMAL LOW (ref 150–400)
RBC: 2.8 MIL/uL — ABNORMAL LOW (ref 3.87–5.11)
RDW: 16.1 % — ABNORMAL HIGH (ref 11.5–15.5)
WBC: 0.4 10*3/uL — CL (ref 4.0–10.5)
nRBC: 0 % (ref 0.0–0.2)

## 2018-09-21 LAB — URINALYSIS, ROUTINE W REFLEX MICROSCOPIC
Bilirubin Urine: NEGATIVE
GLUCOSE, UA: NEGATIVE mg/dL
Ketones, ur: NEGATIVE mg/dL
Nitrite: NEGATIVE
Protein, ur: 100 mg/dL — AB
Specific Gravity, Urine: 1.015 (ref 1.005–1.030)
pH: 6 (ref 5.0–8.0)

## 2018-09-21 LAB — BASIC METABOLIC PANEL
Anion gap: 10 (ref 5–15)
BUN: 13 mg/dL (ref 8–23)
CO2: 29 mmol/L (ref 22–32)
Calcium: 7.7 mg/dL — ABNORMAL LOW (ref 8.9–10.3)
Chloride: 94 mmol/L — ABNORMAL LOW (ref 98–111)
Creatinine, Ser: 0.46 mg/dL (ref 0.44–1.00)
GFR calc Af Amer: >60 mL/min (ref >60)
GFR calc non Af Amer: >60 mL/min (ref >60)
Glucose, Bld: 96 mg/dL (ref 70–99)
Potassium: 3.5 mmol/L (ref 3.5–5.1)
Sodium: 133 mmol/L — ABNORMAL LOW (ref 135–145)

## 2018-09-21 MED ORDER — LEVALBUTEROL HCL 1.25 MG/0.5ML IN NEBU
1.2500 mg | INHALATION_SOLUTION | Freq: Once | RESPIRATORY_TRACT | Status: AC
  Administered 2018-09-21: 1.25 mg via RESPIRATORY_TRACT

## 2018-09-21 MED ORDER — LEVALBUTEROL HCL 0.63 MG/3ML IN NEBU
0.6300 mg | INHALATION_SOLUTION | Freq: Three times a day (TID) | RESPIRATORY_TRACT | Status: DC
  Administered 2018-09-21 – 2018-09-25 (×13): 0.63 mg via RESPIRATORY_TRACT
  Filled 2018-09-21 (×13): qty 3

## 2018-09-21 MED ORDER — SODIUM CHLORIDE 0.9% FLUSH
10.0000 mL | INTRAVENOUS | Status: DC | PRN
  Administered 2018-09-21: 20 mL
  Administered 2018-09-30: 10 mL
  Filled 2018-09-21: qty 40

## 2018-09-21 MED ORDER — FUROSEMIDE 10 MG/ML IJ SOLN
40.0000 mg | Freq: Once | INTRAMUSCULAR | Status: AC
  Administered 2018-09-21: 40 mg via INTRAVENOUS
  Filled 2018-09-21: qty 4

## 2018-09-21 MED ORDER — LEVALBUTEROL HCL 1.25 MG/0.5ML IN NEBU
INHALATION_SOLUTION | RESPIRATORY_TRACT | Status: AC
  Administered 2018-09-21: 1.25 mg via RESPIRATORY_TRACT
  Filled 2018-09-21: qty 0.5

## 2018-09-21 MED ORDER — SODIUM CHLORIDE 0.9 % IV SOLN
2.0000 g | Freq: Three times a day (TID) | INTRAVENOUS | Status: AC
  Administered 2018-09-21 – 2018-09-24 (×10): 2 g via INTRAVENOUS
  Filled 2018-09-21 (×11): qty 2

## 2018-09-21 NOTE — Progress Notes (Signed)
Patient Demographics:    Patricia Murillo, is a 72 y.o. female, DOB - 1946/01/12, ORV:[id number]  Admit date - 09/07/2018   Admitting Physician Mauricio Gerome Apley, MD  Outpatient Primary MD for the patient is Patient, No Pcp Per  LOS - 81   Chief Complaint  Patient presents with  . 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 with newly diagnosed high-grade B-cell lymphoma, recently discharged from hospital after biopsy, presented to the emergency room with progressive weakness, dyspnea, chills and fever, anorexia, and extremity edema.    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 Likely related to progressive high-grade B-cell lymphoma with pulmonary involvement comp  located by suspected aspiration--- respiratory status actually improving, started on IV cefepime on 09/21/2018 as above #1  7) acute toxic and metabolic encephalopathy----altered factorial including malignancy, hypercalcemia , improving  8) moderate protein caloric malnutrition----due to underlying malignancy and poor oral intake, patient has low albumin and edema--- nutritional supplements advised  9) significant left upper extremity swelling and weakness----due to lymphatic obstruction by high-grade lymphoma, venous Dopplers negative of the DVT on 08/29/2018  10)Pupil Discrepancy -Noted to be new -No Vision loss -Head CT Negative -Continue to Monitor   Disposition/Need for in-Hospital Stay- patient unable to be discharged at this time due to neutropenic fevers requiring IV antibiotics pending cultures patient with significant neutropenia and at risk for significant systemic infection  Code Status : FULL    Disposition Plan  : TBD  Consults  :  oncology   DVT Prophylaxis  :  TEDs - SCDs   Lab Results  Component Value Date   PLT 111 (L) 09/21/2018    Inpatient Medications  Scheduled Meds: . sodium chloride   Intravenous Once  . sodium chloride   Intravenous Once  . acyclovir  400 mg Oral BID  . allopurinol  300 mg Oral Daily  . cholecalciferol  2,000 Units Oral Daily  . famotidine  20 mg Oral Daily  . feeding supplement (ENSURE ENLIVE)  237 mL Oral BID BM  . fluticasone  2 spray Each Nare Daily  . folic acid  1  mg Oral Daily  . levalbuterol  0.63 mg Nebulization TID  . loratadine  10 mg Oral Daily  . mouth rinse  15 mL Mouth Rinse BID  . mupirocin ointment   Topical BID  . pantoprazole (PROTONIX) IV  40 mg Intravenous Q12H  . polyethylene glycol  17 g Oral Daily  . predniSONE  40 mg Oral Q breakfast  . senna-docusate  1 tablet Oral BID  . Tbo-filgastrim (GRANIX) SQ  480 mcg Subcutaneous Daily   Continuous Infusions: . sodium chloride Stopped (09/12/18 1805)  .  sodium chloride    . ceFEPime (MAXIPIME) IV 2 g (09/21/18 1406)   PRN Meds:.sodium chloride, sodium chloride, acetaminophen **OR** [DISCONTINUED] acetaminophen, alum & mag hydroxide-simeth **AND** lidocaine, benzonatate, guaiFENesin-dextromethorphan, HYDROcodone-acetaminophen, lidocaine, lip balm, meclizine, ondansetron **OR** ondansetron (ZOFRAN) IV, sodium chloride flush    Anti-infectives (From admission, onward)   Start     Dose/Rate Route Frequency Ordered Stop   09/21/18 1200  ceFEPIme (MAXIPIME) 2 g in sodium chloride 0.9 % 100 mL IVPB     2 g 200 mL/hr over 30 Minutes Intravenous Every 8 hours 09/21/18 1027     09/12/18 1600  ampicillin-sulbactam (UNASYN) 1.5 g in sodium chloride 0.9 % 100 mL IVPB  Status:  Discontinued     1.5 g 200 mL/hr over 30 Minutes Intravenous Every 6 hours 09/12/18 1408 09/15/18 0753   09/10/18 1000  acyclovir (ZOVIRAX) 200 MG capsule 400 mg     400 mg Oral 2 times daily 09/10/18 0754     09/07/18 2000  cefTRIAXone (ROCEPHIN) 1 g in sodium chloride 0.9 % 100 mL IVPB  Status:  Discontinued     1 g 200 mL/hr over 30 Minutes Intravenous Every 24 hours 09/07/18 1844 09/12/18 0940   09/07/18 0830  ceFEPIme (MAXIPIME) 2 g in sodium chloride 0.9 % 100 mL IVPB     2 g 200 mL/hr over 30 Minutes Intravenous  Once 09/07/18 0820 09/07/18 0912   09/07/18 0830  metroNIDAZOLE (FLAGYL) IVPB 500 mg  Status:  Discontinued     500 mg 100 mL/hr over 60 Minutes Intravenous Every 8 hours 09/07/18 0820 09/08/18 1812   09/07/18 0830  vancomycin (VANCOCIN) IVPB 1000 mg/200 mL premix     1,000 mg 200 mL/hr over 60 Minutes Intravenous  Once 09/07/18 0820 09/07/18 1157        Objective:   Vitals:   09/21/18 0730 09/21/18 1118 09/21/18 1336 09/21/18 1341  BP:   (!) 148/66   Pulse:   92   Resp:   20   Temp: 100.2 F (37.9 C) 98.4 F (36.9 C) 99.4 F (37.4 C)   TempSrc: Oral  Oral   SpO2:   100% 98%  Weight:      Height:        Wt Readings from Last 3 Encounters:    09/20/18 126 kg  08/30/18 90.2 kg  08/04/18 83.5 kg     Intake/Output Summary (Last 24 hours) at 09/21/2018 1816 Last data filed at 09/21/2018 1715 Gross per 24 hour  Intake 700 ml  Output 1000 ml  Net -300 ml     Physical Exam Patient is examined daily including today on 09/21/18 , exams remain the same as of yesterday except that has changed   Gen:- Awake Alert,  In no apparent distress  HEENT:- [place].AT, No sclera icterus Neck-Supple Neck,No JVD,.  Lungs-  CTAB , fair symmetrical air movement CV- S1, S2 normal, regular  Abd-  +ve B.Sounds,  Abd Soft, No tenderness,    Extremity/Skin:- pedal pulses present , significant edema of left upper extremity, bilateral lower extremity edema improving slowly, right upper extremity PICC line noted Psych-affect is appropriate, oriented x3 Neuro-no new focal deficits, no tremors   Data Review:   Micro Results Recent Results (from the past 240 hour(s))  Culture, blood (routine x 2)     Status: None (Preliminary result)   Collection Time: 09/21/18  8:13 AM  Result Value Ref Range Status   Specimen Description   Final    BLOOD RIGHT ANTECUBITAL Performed at [facility], [place] [address], [place], [place] [address]    Special Requests   Final    BOTTLES DRAWN AEROBIC AND ANAEROBIC Blood Culture adequate volume Performed at [facility] [address] [place], [place] [address]    Culture PENDING  Incomplete   Report Status PENDING  Incomplete    Radiology Reports Dg Chest 2 View  Result Date: 09/17/2018 CLINICAL DATA:  Cough. EXAM: CHEST - 2 VIEW COMPARISON:  Radiograph of September 14, 2018. FINDINGS: Stable cardiomediastinal silhouette. Large hiatal hernia is noted. No pneumothorax is noted. Right-sided pacemaker is unchanged in position. Mild bilateral pleural effusions are noted with associated atelectasis. Bony thorax is unremarkable. IMPRESSION: Stable large hiatal hernia. Mild bilateral pleural  effusions are noted with associated atelectasis. Electronically Signed   By: Marijo Conception, M.D.   On: 09/17/2018 14:16   Dg Chest 2 View  Result Date: 09/07/2018 CLINICAL DATA:  Increasing shortness of breath. Decreased oxygen saturation since yesterday. EXAM: CHEST - 2 VIEW COMPARISON:  Single-view of the chest and CT chest 08/29/2018. FINDINGS: There is cardiomegaly without edema. Moderate left pleural effusion and basilar atelectasis are seen. Pulmonary nodules in the left upper lobe and along the periphery of the left lung are identified as seen on prior CT. Small right pulmonary nodule seen on CT are difficult to visualize on this examination. Large hiatal hernia is noted. No pneumothorax. IMPRESSION: No change in a moderate left pleural effusion and left basilar atelectasis. Pulmonary nodules. Cardiomegaly without edema. Hiatal hernia. Electronically Signed   By: Inge Rise M.D.   On: 101-14-202019 09:02   Ct Head Wo Contrast  Result Date: 09/07/2018 CLINICAL DATA:  Sepsis. Recent diagnosis of suspected metastatic disease in chest abdomen and pelvis on CT studies. New left sided proptosis and left upper and lower extremity weakness. No reported injury. EXAM: CT HEAD WITHOUT CONTRAST TECHNIQUE: Contiguous axial images were obtained from the base of the skull through the vertex without intravenous contrast. COMPARISON:  07/15/2017 head CT. FINDINGS: Brain: No evidence of parenchymal hemorrhage or extra-axial fluid collection. No mass lesion, mass effect, or midline shift. No CT evidence of acute infarction. Nonspecific mild subcortical and periventricular white matter hypodensity, most in keeping with chronic small vessel ischemic change. Cerebral volume is age appropriate. No ventriculomegaly. Vascular: No acute abnormality. Skull: No evidence of calvarial fracture. Sinuses/Orbits: No fluid levels. Mild mucoperiosteal thickening in the maxillary sinuses bilaterally. Other:  The mastoid air cells  are unopacified. IMPRESSION: 1.  No evidence of acute intracranial abnormality. 2. Mild chronic small vessel ischemic changes in the cerebral white matter. 3. Mild chronic appearing paranasal sinusitis. Electronically Signed   By: Ilona Sorrel M.D.   On: 101-14-202019 12:06   Ct Angio Chest Pe W/cm &/or Wo Cm  Result Date: 09/07/2018 CLINICAL DATA:  Shortness of breath and weakness. Recent diagnosis high-grade B-cell lymphoma after left axillary lymph node  biopsy. EXAM: CT ANGIOGRAPHY CHEST WITH CONTRAST TECHNIQUE: Multidetector CT imaging of the chest was performed using the standard protocol during bolus administration of intravenous contrast. Multiplanar CT image reconstructions and MIPs were obtained to evaluate the vascular anatomy. CONTRAST:  169m ISOVUE-370 IOPAMIDOL (ISOVUE-370) INJECTION 76% COMPARISON:  CTA of the chest on 08/29/2018 FINDINGS: Cardiovascular: The pulmonary arteries are adequately opacified. There is no evidence of pulmonary embolism. The thoracic aorta is normal in caliber and demonstrates no evidence of aneurysm or dissection. Proximal great vessels are normally patent. The heart size is stable and at the upper limits of normal. Pacemaker shows stable positioning. No pericardial fluid identified. Mediastinum/Nodes: Massive confluent lymphadenopathy of the left axilla extending into the left subpectoral region and encasing the region of the brachial plexus, left axillary vessels and left subclavian vessels appears progressively enlarged since the prior CTA. Index lateral subpectoral lymph node measured previously at 2.8 cm in short axis now measures approximately 4.1 cm. More inferior left axillary node measured at 2.5 cm in short axis previously now measures 3.1 cm. Confluent lymph node mass encasement of vessels does not cause arterial occlusion but may be causing significant mass effect on venous structures. The veins are not opacified on the study to determine whether any venous  thrombus is present. Associated right hilar and mediastinal lymphadenopathy also appears slightly more prominent compared to the prior study. Largest area right hilar lymphadenopathy measures approximately 2.2 cm. Stable large hiatal hernia. Lungs/Pleura: There also is progressive pulmonary involvement with lymphoma with definite progression since the prior CTA. All of the multiple bilateral pulmonary nodules show enlargement since the prior study. Index mass in the subpleural left lower lobe now measures approximately 2.2 x 3.7 cm (previously 1.7 x 3.3 cm). All other lesions also show enlargement. New small right pleural effusion. Small to moderate left pleural effusion appears stable since the prior study. Upper Abdomen: Stable splenomegaly. Musculoskeletal: No chest wall abnormality. No acute or significant osseous findings. Review of the MIP images confirms the above findings. IMPRESSION: 1. No evidence of pulmonary embolism. 2. Progression of lymphoma since the prior CTA on 11/25 with enlargement of left axillary, subpectoral and chest wall lymph node masses with encasement of left subclavian and axillary vascular structures. Enlargement of right hilar and mediastinal lymph nodes since the prior study. Pulmonary involvement also shows significant progression with enlargement of all of the bilateral pulmonary nodule seen previously. 3. Normal arterial patency demonstrated of the left subclavian and axillary arteries. Venous patency cannot be determined on the CTA study and the patient would be at high risk of left upper extremity DVT given the large bulky lymphadenopathy present. 4. New small right pleural effusion. Stable small to moderate left pleural effusion. 5. Stable splenomegaly. Electronically Signed   By: GAletta EdouardM.D.   On: 12020/06/2718 12:29   Ct Angio Chest Pe W Or Wo Contrast  Result Date: 08/29/2018 CLINICAL DATA:  Severe left arm swelling since yesterday. Hemoglobin L5. EXAM: CT  ANGIOGRAPHY CHEST WITH CONTRAST TECHNIQUE: Multidetector CT imaging of the chest was performed using the standard protocol during bolus administration of intravenous contrast. Multiplanar CT image reconstructions and MIPs were obtained to evaluate the vascular anatomy. CONTRAST:  100 cc ISOVUE-370 IOPAMIDOL (ISOVUE-370) INJECTION 76% COMPARISON:  CXR 08/29/2018 FINDINGS: Cardiovascular: Conventional branch pattern of the great vessels with minimal atherosclerosis at the origins. No significant stenosis. Patent left subclavian artery traversing an area of ill-defined soft tissue edema and enlargement as described below. Mild aortic atherosclerosis without aneurysm  or dissection. Satisfactory opacification of the pulmonary arteries to the proximal segmental level without large central pulmonary embolus. Mediastinum/Nodes: Left axillary nodal enlargement is identified ranging size from 0.3 cm to 2.8 cm short axis. Mildly enlarged right-sided axillary lymph nodes up to 1.3 cm short axis are identified. Large hiatal hernia with air-fluid level noted within. 1.4 cm short axis mass abutting the left lateral aspect of the aortic arch in the prevascular space is noted more likely to represent a pulmonary lesion as opposed lymphadenopathy. Nonpathologic sized mediastinal and hilar lymph nodes are noted. Lungs/Pleura: Masslike opacities are identified in the lingula, the largest abutting the pleural surface measuring 3.3 x 1.7 cm on series 5/92 with previously mentioned paramediastinal mass in the left upper lobe measuring 2.3 x 1.4 cm, series 5/49. Superior segment left lower lobe mass measuring 1.8 x 1.3 cm, series 5/52 is also noted. There are smaller subcentimeter nodules within both upper lobes, right middle lobe and right lower lobe with largest masslike opacities in the right lower lobe measuring 1.1 x 1 cm, series 5/89 and abutting the right lateral margin of the hiatal hernia measuring 2.5 x 1.9 cm, series 5/105.  Moderate left effusion with compressive atelectasis is identified with trace right pleural effusion. No pneumothorax. Upper Abdomen: No space-occupying mass of the included liver. The included right adrenal gland is unremarkable. Nodular soft tissue densities in the left upper quadrant are felt to represent partial imaging of small bowel less likely mesenteric adenopathy. The included spleen is top-normal in size. Musculoskeletal: Ill-defined asymmetric soft tissue enlargement in the region of the left pectoralis muscle suspicious for intramuscular hemorrhage and edema given reported rapid onset of left arm swelling and clinical report of low hemoglobin. Asymmetric subcutaneous edema of the partially included left breast with thickening of the skin concerning for possible possible inflammatory carcinoma of the breast. Right-sided pacemaker apparatus is noted with leads in the right atrium right ventricle. Thoracic spondylosis is noted. Review of the MIP images confirms the above findings. IMPRESSION: 1. No acute large central pulmonary embolus, aortic aneurysm or dissection. 2. Multilobar masslike opacities scattered throughout both lungs as above described, the largest in the lingula measuring up to 3.3 cm, presumed metastatic given left axillary adenopathy also noted. 3. Left axillary nodal enlargement measuring up to 2.8 cm short axis. 4. Asymmetric soft tissue enlargement of the left pectoralis muscle suspicious for intramuscular hemorrhage and edema given reported rapid onset of left arm swelling and clinical report of low hemoglobin. An infiltrating mass or metastasis is also a differential considerations given local adenopathy, asymmetric subcutaneous edema of the partially included left breast with thickening of the skin concerning for possible inflammatory carcinoma of the breast. Clinical correlation is therefore recommended as well as mammographic correlation. 5. Moderate left and trace right pleural  effusions with compressive atelectasis. These results were called by telephone at the time of interpretation on 08/29/2018 at 3:43 pm to Dr. Karmen Bongo , who verbally acknowledged these results. Aortic Atherosclerosis (ICD10-I70.0). Electronically Signed   By: Ashley Royalty M.D.   On: 08/29/2018 15:43   Ct Abdomen Pelvis W Contrast  Result Date: 08/31/2018 CLINICAL DATA:  Pulmonary nodules on previous chest CT suspicious for metastatic disease. EXAM: CT ABDOMEN AND PELVIS WITH CONTRAST TECHNIQUE: Multidetector CT imaging of the abdomen and pelvis was performed using the standard protocol following bolus administration of intravenous contrast. CONTRAST:  163m OMNIPAQUE IOHEXOL 300 MG/ML  SOLN COMPARISON:  Chest CT 08/29/2018 FINDINGS: Lower chest: Pacer wires noted in  the heart. Large hiatal hernia. Bilateral pleural effusions, left greater than right. Pulmonary nodules noted in both lower lungs. Hepatobiliary: No focal abnormality within the liver parenchyma. Liver measures 20.1 cm craniocaudal length. Dependent sludge or stones noted in the gallbladder. No intrahepatic or extrahepatic biliary dilation. Pancreas: No focal mass lesion. No dilatation of the main duct. No intraparenchymal cyst. No peripancreatic edema. Spleen: Spleen measures 13.7 cm in craniocaudal length, upper normal to mildly increased. Adrenals/Urinary Tract: No adrenal nodule or mass. Kidneys unremarkable. Central sinus cysts noted left kidney. No evidence for hydroureter. The urinary bladder appears normal for the degree of distention. Stomach/Bowel: Large hiatal hernia. Duodenum is normally positioned as is the ligament of Treitz. Duodenal diverticulum noted. No small bowel wall thickening. No small bowel dilatation. The terminal ileum is normal. The appendix is not visualized, but there is no edema or inflammation in the region of the cecum. No gross colonic mass. No colonic wall thickening. Diverticular changes are noted in the left  colon without evidence of diverticulitis. Vascular/Lymphatic: There is abdominal aortic atherosclerosis without aneurysm. Mild lymphadenopathy noted hepato duodenal ligament with 13 mm short axis index node visible on 21/3. Borderline para-aortic retroperitoneal lymphadenopathy evident. 10 mm short axis left para-aortic lymph node visible on 31/3. relatively bulky lymphadenopathy is seen along both pelvic sidewalls. 2.2 cm short axis left external iliac node is visible on 76/3. 2.8 cm short axis right external iliac lymph node associated with another 2.9 cm short axis node. 2.2 cm short axis lymph node identified in the right groin. Reproductive: Uterus unremarkable.  There is no adnexal mass. Other: No intraperitoneal free fluid. Musculoskeletal: Extensive body wall edema noted left lower hemithorax and left upper abdominal wall. Degenerative changes noted left hip. IMPRESSION: 1. Bulky pelvic sidewall lymphadenopathy with borderline to mild lymphadenopathy in the hepato duodenal ligament and para-aortic retroperitoneal space. Lymphoma or metastatic disease would be primary considerations. 2. Hepatomegaly with borderline splenomegaly. Prominent portal and splenic veins raise the question of portal venous hypertension. 3. Gallbladder sludge or tiny stones. 4. Left chest wall and upper abdominal wall edema. 5. Large hiatal hernia with bilateral pleural effusions, similar to prior. 6.  Aortic Atherosclerois (ICD10-170.0) Electronically Signed   By: Misty Stanley M.D.   On: 08/31/2018 13:13   Dg Chest Port 1 View  Result Date: 09/21/2018 CLINICAL DATA:  Tachypnea. EXAM: PORTABLE CHEST 1 VIEW COMPARISON:  Chest radiographs 2 days prior, chest CT 1September 29, 202019 FINDINGS: Right-sided pacemaker in place. Right upper extremity PICC in place, tip in the proximal SVC. The patient is significantly rotated. Bilateral pleural effusions and associated airspace disease, likely progressed at the left lung base. Nodular density in  the periphery of the left lung again seen. Large retrocardiac hiatal hernia. Unchanged heart size and mediastinal contours. No pulmonary edema. IMPRESSION: Bilateral pleural effusions, increased on the left. Unchanged radiographic appearance of large hiatal hernia. Peripheral nodular opacity as seen on prior chest CT. Additional pulmonary nodules on CT are not visualized radiographically Electronically Signed   By: Keith Rake M.D.   On: 09/21/2018 03:42   Dg Chest Port 1 View  Addendum Date: 09/14/2018   ADDENDUM REPORT: 09/14/2018 09:14 ADDENDUM: The impression should read: Stable chest x-ray demonstrating pulmonary edema and bilateral pleural effusions. Electronically Signed   By: Aletta Edouard M.D.   On: 09/14/2018 09:14   Result Date: 09/14/2018 CLINICAL DATA:  Shortness of breath. History of progressive high-grade B-cell lymphoma. EXAM: PORTABLE CHEST 1 VIEW COMPARISON:  Chest x-ray on 09/09/2018 and  CT of the chest on 102/05/2018. FINDINGS: Stable heart size, appearance of pacemaker and large hiatal hernia. Stable probable bilateral pleural effusions and bilateral pulmonary nodules. No overt pulmonary edema. No pneumothorax. IMPRESSION: Stable chest x-ray demonstrating pulmonary lymphoma and bilateral pleural effusions. Electronically Signed: By: Aletta Edouard M.D. On: 09/11/2018 14:45   Dg Chest Port 1 View  Result Date: 09/14/2018 CLINICAL DATA:  Shortness of breath. EXAM: PORTABLE CHEST 1 VIEW COMPARISON:  Radiographs 09/11/2018 and 09/09/2018. FINDINGS: 0523 hours. Mild patient rotation to the right. The right arm PICC and right subclavian pacemaker leads appear unchanged. The heart size and mediastinal contours are stable. There is a large hiatal hernia. There are persistent bilateral pleural effusions which appear mildly improved on the right. There is no pneumothorax or confluent airspace opacity. IMPRESSION: No acute findings demonstrated. The right pleural effusion appears  slightly smaller. Electronically Signed   By: Richardean Sale M.D.   On: 09/14/2018 09:10   Dg Chest Port 1 View  Result Date: 09/09/2018 CLINICAL DATA:  Hypoxia, history of progressive lymphoma EXAM: PORTABLE CHEST 1 VIEW COMPARISON:  102/05/2018 FINDINGS: Cardiac shadow is enlarged. Pacing device is again seen. Bilateral pleural effusions are again noted. The nodular changes seen on recent chest CT are not as well appreciated on today's exam. No pneumothorax is noted. IMPRESSION: Bilateral effusions. Patchy densities are noted consistent findings of recent CT although less well appreciated on today's exam. Electronically Signed   By: Inez Catalina M.D.   On: 09/09/2018 06:35   Dg Chest Portable 1 View  Result Date: 08/29/2018 CLINICAL DATA:  72 year old female with a history of shortness of breath and edema EXAM: PORTABLE CHEST 1 VIEW COMPARISON:  04/19/2018, 04/18/2018 FINDINGS: Cardiomediastinal silhouette unchanged in size and contour. Double density projects over the lower mediastinum is unchanged. On the frontal view there is a Passy at the left base obscuring the left hemidiaphragm with blunting of left costophrenic angle, increased from the prior. No pneumothorax. Unchanged right chest wall cardiac pacing device with 2 leads in place. No confluent airspace disease on the right. IMPRESSION: New opacity at the left lung base obscuring left hemidiaphragm in the left heart border, compatible with pleural effusion and associated atelectasis/consolidation. Hiatal hernia. Electronically Signed   By: Corrie Mckusick D.O.   On: 08/29/2018 09:09   Dg Abd Portable 1v  Result Date: 09/13/2018 CLINICAL DATA:  Constipation EXAM: PORTABLE ABDOMEN - 1 VIEW COMPARISON:  CT 08/31/2018 FINDINGS: Nonobstructed bowel gas pattern. Residual contrast within the colon. Mild to moderate feces retention at the rectum. IMPRESSION: Nonobstructed gas pattern with mild to moderate fecal retention at the rectum Electronically  Signed   By: Donavan Foil M.D.   On: 09/13/2018 23:51   Dg Swallowing Func-speech Pathology  Result Date: 09/15/2018 Objective Swallowing Evaluation: Type of Study: MBS-Modified Barium Swallow Study  Patient Details Name: EVANGELYNN LOCHRIDGE MRN: [id number] Date of Birth: 13-Oct-1945 Today's Date: 09/15/2018 Time: SLP Start Time (ACUTE ONLY): 1210 -SLP Stop Time (ACUTE ONLY): 1240 SLP Time Calculation (min) (ACUTE ONLY): 30 min Past Medical History: Past Medical History: Diagnosis Date . Arthritis  . Hiatal hernia  . History of chicken pox  . Hyperlipidemia  . Pacemaker  Past Surgical History: Past Surgical History: Procedure Laterality Date . ANKLE SURGERY   . PACEMAKER IMPLANT N/A 04/18/2018  Procedure: PACEMAKER IMPLANT;  Surgeon: Evans Lance, MD;  Location: [place] [facility];  Service: Cardiovascular;  Laterality: N/A; HPI: Patricia Murillo is a 72 y.o. 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  During her hospitalization she underwent left axillary lymph node biopsy.  She was discharged home in a stable condition, but apparently she has been developing worsening weakness, generalized, worse with exertion, no improving factors, associated with malaise, chills and poor appetite.  Over the last 24 hours her symptoms have been more severe to the point where she has difficulty standing. She has persistent left upper extremity edema which has been painful, sharp in nature up to 10 out of 10 intensity.  Her weakness has led to dyspnea which has been severe in intensity, and triggered her emergency room visit today.Pt was made NPO yesterday due to concerns for aspiration.   Today pt is alert, has congested weak cough concerrning for airway protection.   Pt underwent MBS yesterday and was placed on full liquid diet with precautions. RN reports pt continues to cough with po  intake.  Per note from MD, now plan is for pt to have small bore feeding tube placed for nutritional support.  Unfortunately pt did not tolerate attempt at insertion of feeding tube.  Repeat swallow evaluation ordered.  SLP follow up to inform family/pt of plan and determine if clinically pt appears improved.    Subjective: Alert Assessment / Plan / Recommendation CHL IP CLINICAL IMPRESSIONS 09/15/2018 Clinical Impression Patient had a MBS on 09/12/18 and patient presented with Moderately severe oropharyngeal dysphagia with sensorimotor deficits. She presents much stronger today and presents with a mild oropharyngeal dysphagia. Oral preparation and bolus transfer was mild evidenced by trace oral residue. Patient initiated swallow at the level of the pyriform sinuses with no penetration or aspiration with cup sips of all sizes. She did have penetration with cup sip with straw, but spontaneously cleared layngeal vestibule. She initiated swallow at the valleculae with puree and cracker bolus, no penetration or aspiration. Significant pharyngeal residue present after 1st swallow, but improved mostly cleared with second spontaneous swallow. Upper 1/3 of esphagus observed to clear well with puree and tablet. Due to significant sensorimotor improvement, recommend patient have regular diet with thin liquids, medication whole in puree. Speech therapy to follow up for continue diet tolerance and swallowing therapy. SLP Visit Diagnosis Dysphagia, oropharyngeal phase (R13.12) Attention and concentration deficit following -- Frontal lobe and executive function deficit following -- Impact on safety and function Mild aspiration risk   CHL IP TREATMENT RECOMMENDATION 09/15/2018 Treatment Recommendations Therapy as outlined in treatment plan below   Prognosis 09/15/2018 Prognosis for Safe Diet Advancement Good Barriers to Reach Goals -- Barriers/Prognosis Comment -- CHL IP DIET RECOMMENDATION 09/15/2018 SLP Diet Recommendations  Regular solids;Thin liquid Liquid Administration via Cup;No straw Medication Administration Whole meds with liquid Compensations Slow rate;Small sips/bites;Other (Comment) Postural Changes Remain semi-upright after after feeds/meals (Comment);Seated upright at 90 degrees   CHL IP OTHER RECOMMENDATIONS 09/15/2018 Recommended Consults -- Oral Care Recommendations Oral care before and after PO Other Recommendations --   CHL IP FOLLOW UP RECOMMENDATIONS 09/14/2018 Follow up Recommendations (No Data)   CHL IP FREQUENCY AND DURATION 09/15/2018 Speech Therapy Frequency (ACUTE ONLY) min 2x/week Treatment Duration 1 week      CHL IP ORAL PHASE 09/15/2018 Oral Phase Impaired Oral - Pudding Teaspoon -- Oral - Pudding Cup Lingual/palatal residue Oral - Honey Teaspoon -- Oral - Honey Cup -- Oral - Nectar Teaspoon NT Oral - Nectar Cup NT Oral - Nectar Straw  NT Oral - Thin Teaspoon Lingual/palatal residue Oral - Thin Cup Lingual/palatal residue;Impaired mastication Oral - Thin Straw Lingual/palatal residue Oral - Puree Lingual/palatal residue Oral - Mech Soft Lingual/palatal residue Oral - Regular -- Oral - Multi-Consistency -- Oral - Pill -- Oral Phase - Comment --  CHL IP PHARYNGEAL PHASE 09/15/2018 Pharyngeal Phase Impaired Pharyngeal- Pudding Teaspoon Delayed swallow initiation-vallecula;Pharyngeal residue - valleculae;Pharyngeal residue - pyriform Pharyngeal -- Pharyngeal- Pudding Cup NT Pharyngeal -- Pharyngeal- Honey Teaspoon -- Pharyngeal -- Pharyngeal- Honey Cup -- Pharyngeal -- Pharyngeal- Nectar Teaspoon NT Pharyngeal -- Pharyngeal- Nectar Cup NT Pharyngeal -- Pharyngeal- Nectar Straw NT Pharyngeal -- Pharyngeal- Thin Teaspoon Delayed swallow initiation-pyriform sinuses;Reduced pharyngeal peristalsis;Reduced anterior laryngeal mobility;Reduced laryngeal elevation;Reduced airway/laryngeal closure;Reduced tongue base retraction;Pharyngeal residue - valleculae;Pharyngeal residue - pyriform Pharyngeal -- Pharyngeal- Thin  Cup Delayed swallow initiation-pyriform sinuses;Reduced anterior laryngeal mobility;Reduced laryngeal elevation;Reduced airway/laryngeal closure;Reduced tongue base retraction;Pharyngeal residue - valleculae;Pharyngeal residue - pyriform;Reduced pharyngeal peristalsis Pharyngeal -- Pharyngeal- Thin Straw Delayed swallow initiation-pyriform sinuses;Reduced anterior laryngeal mobility;Reduced laryngeal elevation;Reduced airway/laryngeal closure;Reduced pharyngeal peristalsis;Reduced tongue base retraction;Pharyngeal residue - valleculae;Pharyngeal residue - pyriform;Penetration/Aspiration during swallow Pharyngeal -- Pharyngeal- Puree Delayed swallow initiation-vallecula;Reduced laryngeal elevation;Reduced airway/laryngeal closure;Reduced tongue base retraction;Pharyngeal residue - valleculae Pharyngeal -- Pharyngeal- Mechanical Soft Delayed swallow initiation-vallecula;Reduced laryngeal elevation;Reduced airway/laryngeal closure;Reduced tongue base retraction Pharyngeal -- Pharyngeal- Regular NT Pharyngeal -- Pharyngeal- Multi-consistency -- Pharyngeal -- Pharyngeal- Pill Delayed swallow initiation-vallecula;Reduced laryngeal elevation;Reduced airway/laryngeal closure Pharyngeal -- Pharyngeal Comment --  CHL IP CERVICAL ESOPHAGEAL PHASE 09/15/2018 Cervical Esophageal Phase WFL Pudding Teaspoon -- Pudding Cup -- Honey Teaspoon -- Honey Cup -- Nectar Teaspoon -- Nectar Cup -- Nectar Straw -- Thin Teaspoon -- Thin Cup -- Thin Straw -- Puree -- Mechanical Soft -- Regular -- Multi-consistency -- Pill -- Cervical Esophageal Comment -- Patricia Cousins Ward, MA, CCC-SLP 09/15/2018 2:59 PM              Dg Swallowing Func-speech Pathology  Result Date: 09/12/2018 Objective Swallowing Evaluation: Type of Study: MBS-Modified Barium Swallow Study  Patient Details Name: NAZARIAH CADET MRN: [id number] Date of Birth: 1945-12-30 Today's Date: 09/12/2018 Time: SLP Start Time (ACUTE ONLY): 1300 -SLP Stop Time (ACUTE ONLY): 1337 SLP Time  Calculation (min) (ACUTE ONLY): 37 min Past Medical History: Past Medical History: Diagnosis Date . Arthritis  . Hiatal hernia  . History of chicken pox  . Hyperlipidemia  . Pacemaker  Past Surgical History: Past Surgical History: Procedure Laterality Date . ANKLE SURGERY   . PACEMAKER IMPLANT N/A 04/18/2018  Procedure: PACEMAKER IMPLANT;  Surgeon: Evans Lance, MD;  Location: [place] [facility];  Service: Cardiovascular;  Laterality: N/A; HPI: DREONNA HUSSEIN is a 72 y.o. 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  During her hospitalization she underwent left axillary lymph node biopsy.  She was discharged home in a stable condition, but apparently she has been developing worsening weakness, generalized, worse with exertion, no improving factors, associated with malaise, chills and poor appetite.  Over the last 24 hours her symptoms have been more severe to the point where she has difficulty standing. She has persistent left upper extremity edema which has been painful, sharp in nature up to 10 out of 10 intensity.  Her weakness has led to dyspnea which has been severe in intensity, and triggered her emergency room visit today.Pt was made NPO yesterday due to concerns for aspiration.   Today pt  is alert, has congested weak cough concerrning for airway protection.  Subjective: The patient was seen sitting upright in bed with her daughter at the bedside.  Assessment / Plan / Recommendation CHL IP CLINICAL IMPRESSIONS 09/12/2018 Clinical Impression Pt presents with moderately severe oropharyngeal dysphagia with sensorimotor deficits.  Decreased oral propulsion and weak pharyngeal motility results in significant pharyngeal more than oral residuals.  Pt does NOT sense residuals and is unable to "hock" to expectorate to clear them.  Chin tuck, head turn postures did NOT  decrease residuals.  Pt did aspirate with thin liquids posterior trachea with delayed cough response.  Although she did not aspirate with thicker consistencies, increased residuals present which if aspiration post-swallow are more difficult for pulmonary clearance.  Pt is grossly weak at this time - uncertain of her baseline swallow function.  Pt is a high aspiration/malnutrition risk due to level of dysphagia and weak nonproductive cough.  If she is to consume po intake= it should be with accepted risks and mitigation strategies.  Question if pt's swallow with improve with improved medical status. SLP Visit Diagnosis Dysphagia, oropharyngeal phase (R13.12) Attention and concentration deficit following -- Frontal lobe and executive function deficit following -- Impact on safety and function Severe aspiration risk;Risk for inadequate nutrition/hydration   CHL IP TREATMENT RECOMMENDATION 09/11/2018 Treatment Recommendations Therapy as outlined in treatment plan below   Prognosis 09/12/2018 Prognosis for Safe Diet Advancement Fair Barriers to Reach Goals Severity of deficits;Cognitive deficits;Other (Comment) Barriers/Prognosis Comment -- CHL IP DIET RECOMMENDATION 09/12/2018 SLP Diet Recommendations (No Data) Liquid Administration via Cup Medication Administration Crushed with puree Compensations Slow rate;Small sips/bites;Follow solids with liquid Postural Changes Remain semi-upright after after feeds/meals (Comment);Seated upright at 90 degrees   CHL IP OTHER RECOMMENDATIONS 09/12/2018 Recommended Consults -- Oral Care Recommendations Oral care before and after PO Other Recommendations Have oral suction available   CHL IP FOLLOW UP RECOMMENDATIONS 09/12/2018 Follow up Recommendations (No Data)   CHL IP FREQUENCY AND DURATION 09/12/2018 Speech Therapy Frequency (ACUTE ONLY) min 2x/week Treatment Duration 1 week      CHL IP ORAL PHASE 09/12/2018 Oral Phase Impaired Oral - Pudding Teaspoon -- Oral - Pudding Cup -- Oral -  Honey Teaspoon -- Oral - Honey Cup -- Oral - Nectar Teaspoon Weak lingual manipulation;Reduced posterior propulsion;Lingual/palatal residue Oral - Nectar Cup Weak lingual manipulation;Reduced posterior propulsion;Lingual/palatal residue Oral - Nectar Straw Weak lingual manipulation;Reduced posterior propulsion;Lingual/palatal residue Oral - Thin Teaspoon Weak lingual manipulation;Reduced posterior propulsion Oral - Thin Cup Weak lingual manipulation;Reduced posterior propulsion Oral - Thin Straw Reduced posterior propulsion Oral - Puree Weak lingual manipulation;Reduced posterior propulsion Oral - Mech Soft Weak lingual manipulation;Reduced posterior propulsion Oral - Regular -- Oral - Multi-Consistency -- Oral - Pill -- Oral Phase - Comment --  CHL IP PHARYNGEAL PHASE 09/12/2018 Pharyngeal Phase Impaired Pharyngeal- Pudding Teaspoon -- Pharyngeal -- Pharyngeal- Pudding Cup -- Pharyngeal -- Pharyngeal- Honey Teaspoon -- Pharyngeal -- Pharyngeal- Honey Cup -- Pharyngeal -- Pharyngeal- Nectar Teaspoon Reduced pharyngeal peristalsis;Reduced epiglottic inversion;Reduced tongue base retraction;Pharyngeal residue - valleculae;Pharyngeal residue - pyriform Pharyngeal -- Pharyngeal- Nectar Cup Reduced pharyngeal peristalsis;Reduced epiglottic inversion;Reduced laryngeal elevation;Reduced airway/laryngeal closure;Reduced tongue base retraction;Pharyngeal residue - valleculae;Pharyngeal residue - pyriform;Penetration/Aspiration during swallow Pharyngeal Material enters airway, remains ABOVE vocal cords and not ejected out Pharyngeal- Nectar Straw Reduced pharyngeal peristalsis;Reduced epiglottic inversion;Reduced anterior laryngeal mobility;Reduced airway/laryngeal closure;Reduced tongue base retraction;Reduced laryngeal elevation;Pharyngeal residue - valleculae;Pharyngeal residue - pyriform Pharyngeal -- Pharyngeal- Thin Teaspoon Reduced pharyngeal peristalsis;Reduced epiglottic inversion;Reduced laryngeal elevation;Reduced  airway/laryngeal closure;Reduced  tongue base retraction;Pharyngeal residue - valleculae;Pharyngeal residue - pyriform Pharyngeal -- Pharyngeal- Thin Cup Reduced pharyngeal peristalsis;Reduced epiglottic inversion;Reduced laryngeal elevation;Reduced airway/laryngeal closure;Reduced tongue base retraction;Pharyngeal residue - valleculae;Pharyngeal residue - pyriform;Penetration/Aspiration during swallow Pharyngeal Material enters airway, passes BELOW cords without attempt by patient to eject out (silent aspiration) Pharyngeal- Thin Straw Reduced pharyngeal peristalsis;Reduced epiglottic inversion;Reduced laryngeal elevation;Reduced airway/laryngeal closure;Reduced tongue base retraction;Pharyngeal residue - valleculae;Pharyngeal residue - pyriform Pharyngeal Material enters airway, passes BELOW cords and not ejected out despite cough attempt by patient Pharyngeal- Puree Reduced epiglottic inversion;Reduced pharyngeal peristalsis;Reduced tongue base retraction;Reduced airway/laryngeal closure;Reduced laryngeal elevation;Pharyngeal residue - valleculae;Pharyngeal residue - pyriform Pharyngeal -- Pharyngeal- Mechanical Soft Reduced pharyngeal peristalsis;Reduced epiglottic inversion;Reduced tongue base retraction;Pharyngeal residue - valleculae;Reduced airway/laryngeal closure Pharyngeal -- Pharyngeal- Regular -- Pharyngeal -- Pharyngeal- Multi-consistency -- Pharyngeal -- Pharyngeal- Pill -- Pharyngeal -- Pharyngeal Comment --  CHL IP CERVICAL ESOPHAGEAL PHASE 09/12/2018 Cervical Esophageal Phase Impaired Pudding Teaspoon -- Pudding Cup -- Honey Teaspoon -- Honey Cup -- Nectar Teaspoon -- Nectar Cup -- Nectar Straw -- Thin Teaspoon -- Thin Cup -- Thin Straw -- Puree -- Mechanical Soft -- Regular -- Multi-consistency -- Pill -- Cervical Esophageal Comment upon esophageal sweep, pt appeared clear Luanna Salk, MS [facility] SLP Acute Rehab Services Pager [phone] Office [phone] Macario Golds 09/12/2018, 5:29 PM               [place] Core Biopsy (lymph Nodes)  Result Date: 08/30/2018 INDICATION: 72 year old with scattered lung lesions, left arm swelling and extensive left axillary lymphadenopathy. Findings are concerning for metastatic disease and tissue diagnosis is needed. EXAM: ULTRASOUND-GUIDED CORE BIOPSY OF LEFT AXILLARY LYMPH NODE MEDICATIONS: None. ANESTHESIA/SEDATION: Moderate (conscious) sedation was employed during this procedure. A total of Versed 0.5 mg and Fentanyl 50 mcg was administered intravenously. Moderate Sedation Time: 15 minutes. The patient's level of consciousness and vital signs were monitored continuously by radiology nursing throughout the procedure under my direct supervision. FLUOROSCOPY TIME:  None COMPLICATIONS: None immediate. PROCEDURE: Informed written consent was obtained from the patient after a thorough discussion of the procedural risks, benefits and alternatives. All questions were addressed. A timeout was performed prior to the initiation of the procedure. Ultrasound demonstrated enlarged left axillary lymph nodes. The left axilla was prepped and draped in sterile fashion. Skin was anesthetized with 1% lidocaine. 18 gauge core biopsies were obtained of a round enlarged lymph node with ultrasound guidance. Total of 6 core biopsies were obtained. Specimens placed in saline. Bandage placed over the puncture site. FINDINGS: Multiple enlarged hypoechoic lymph nodes in left axilla. Biopsy needle confirmed within the targeted lymph node. No bleeding or hematoma formation at end of procedure. IMPRESSION: Ultrasound-guided core biopsy of an enlarged left axillary lymph node. Electronically Signed   By: Markus Daft M.D.   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 Limitations: Body habitus, swelling and poor ultrasound/tissue  interface. Comparison Study: No prior study on file for comparison. Performing Technologist: Sharion Dove RVS  Examination Guidelines: A complete evaluation includes B-mode imaging, spectral Doppler, color Doppler, and power Doppler as needed of all accessible portions of each vessel. Bilateral testing is considered an integral part of a complete examination. Limited examinations for reoccurring indications may be performed as noted.  Right Findings: +----------+------------+----------+---------+-----------+-------+ RIGHT     CompressiblePropertiesPhasicitySpontaneousSummary +----------+------------+----------+---------+-----------+-------+ Subclavian  Yes       Yes            +----------+------------+----------+---------+-----------+-------+  Left Findings: +----------+------------+----------+---------+-----------+-------+ LEFT      CompressiblePropertiesPhasicitySpontaneousSummary +----------+------------+----------+---------+-----------+-------+ IJV                                Yes       Yes            +----------+------------+----------+---------+-----------+-------+ Subclavian                         Yes       Yes            +----------+------------+----------+---------+-----------+-------+ Axillary      Full                                          +----------+------------+----------+---------+-----------+-------+ Brachial      Full                                          +----------+------------+----------+---------+-----------+-------+ Cephalic      Full                                          +----------+------------+----------+---------+-----------+-------+ Basilic       Full                                          +----------+------------+----------+---------+-----------+-------+  Summary:  Right: No evidence of thrombosis in the subclavian.  Left: No evidence of deep vein thrombosis in the upper extremity.  However, unable to visualize the radial and ulnar. No evidence of superficial vein thrombosis in the upper extremity. However, unable to visualize the radial and ulnar. No evidence of thrombosis in the . However, unable to visualize the radial and ulnar. This was a limited study.  *See table(s) above for measurements and observations.  Diagnosing physician: Deitra Mayo MD Electronically signed by Deitra Mayo MD on 08/29/2018 at 4:20:56 PM.    Final    Ir Picc Placement Right >5 Yrs Inc Img Guide  Result Date: 09/09/2018 INDICATION: Acute lymphoma, access for chemotherapy as an inpatient EXAM: ULTRASOUND AND FLUOROSCOPIC GUIDED PICC LINE INSERTION MEDICATIONS: 1% lidocaine local CONTRAST:  None FLUOROSCOPY TIME:  Twenty-four seconds (8 mGy) COMPLICATIONS: None immediate. TECHNIQUE: The procedure, risks, benefits, and alternatives were explained to the patient and informed written consent was obtained. A timeout was performed prior to the initiation of the procedure. The right upper extremity was prepped with chlorhexidine in a sterile fashion, and a sterile drape was applied covering the operative field. Maximum barrier sterile technique with sterile gowns and gloves were used for the procedure. A timeout was performed prior to the initiation of the procedure. Local anesthesia was provided with 1% lidocaine. Under direct ultrasound guidance, the right brachial vein was accessed with a micropuncture kit after the overlying soft tissues were anesthetized with 1% lidocaine. An ultrasound image was saved for documentation purposes. A guidewire was advanced to the level of the superior  caval-atrial junction for measurement purposes and the PICC line was cut to length. A peel-away sheath was placed and a 36 cm, 5 [place], dual lumen was inserted to level of the superior caval-atrial junction. A post procedure spot fluoroscopic was obtained. The catheter easily aspirated and flushed and was sutured in  place. A dressing was placed. The patient tolerated the procedure well without immediate post procedural complication. FINDINGS: After catheter placement, the tip lies within the superior cavoatrial junction. The catheter aspirates and flushes normally and is ready for immediate use. IMPRESSION: Successful ultrasound and fluoroscopic guided placement of a right brachial vein approach, 36 cm, 5 French, dual lumen PICC with tip at the superior caval-atrial junction. The PICC line is ready for immediate use. Electronically Signed   By: Jerilynn Mages.  Shick M.D.   On: 09/09/2018 09:23   [place] Ekg Site Rite  Result Date: 09/08/2018 If Site Rite image not attached, placement could not be confirmed due to current cardiac rhythm.    CBC Recent Labs  Lab 09/16/18 0500 09/17/18 0600 09/18/18 0422 09/19/18 0401 09/20/18 0450 09/21/18 0607  WBC 2.8* 1.6* 0.6* 0.1* 0.1* 0.4*  HGB 6.7* 8.7* 8.9* 8.8* 8.2* 8.0*  HCT 21.3* 26.7* 26.8* 26.7* 25.2* 24.5*  PLT 44* 54* 59* 60* 64* 111*  MCV 91.0 88.1 86.5 87.5 88.7 87.5  MCH 28.6 28.7 28.7 28.9 28.9 28.6  MCHC 31.5 32.6 33.2 33.0 32.5 32.7  RDW 17.2* 16.3* 16.1* 15.9* 15.9* 16.1*  LYMPHSABS 0.0* 0.0* 0.0*  --   --  0.0*  MONOABS 0.0* 0.0* 0.0*  --   --  0.2  EOSABS 0.0 0.0 0.0  --   --  0.0  BASOSABS 0.0 0.0 0.0  --   --  0.0    Chemistries  Recent Labs  Lab 09/15/18 0555 09/16/18 0500 09/17/18 0600 09/18/18 0422 09/19/18 0401 09/20/18 0450 09/21/18 0607  NA 143 141 142 141 140 135 133*  K 3.9 3.9 3.5 3.1* 3.6 3.8 3.5  CL 111 108 104 99 98 98 94*  CO2 26 28 29  34* 35* 29 29  GLUCOSE 93 94 94 105* 110* 110* 96  BUN 48* 32* 23 17 15 13 13   CREATININE 0.55 0.44 0.41* 0.38* 0.40* 0.36* 0.46  CALCIUM 9.1 9.2 9.1 8.9 8.7* 8.4* 7.7*  MG  --   --   --  1.8 2.1  --   --   AST 21 23  --   --  23 16  --   ALT 14 14  --   --  20 18  --   ALKPHOS 42 43  --   --  61 61  --   BILITOT 1.3* 1.2  --   --  2.1* 2.1*  --     ------------------------------------------------------------------------------------------------------------------ No results for input(s): CHOL, HDL, LDLCALC, TRIG, CHOLHDL, LDLDIRECT in the last 72 hours.  Lab Results  Component Value Date   HGBA1C 5.3 09/13/2012    Roxan Hockey M.D on 09/21/2018 at 6:16 PM  Pager---[phone] Go to [email or web address] - password [email or web address] for contact info  [facility] - Office  [phone]

## 2018-09-21 NOTE — Progress Notes (Signed)
SLP Cancellation Note  Patient Details Name: Patricia Murillo MRN: [id number] DOB: 1946-02-25   Cancelled treatment:       Reason Eval/Treat Not Completed: Other (comment)(note rapid response called earlier today and pt with .4 WBC and fever, will continue efforts, if concern for aspiration present - recommend npo )   Macario Golds 09/21/2018, 2:34 PM  Luanna Salk, [facility] SLP [place] Pager [phone] Office [phone]

## 2018-09-21 NOTE — Progress Notes (Signed)
Provider Schorr notified Temp 102.2, tylenol po given. Will continue to monitor.

## 2018-09-21 NOTE — Progress Notes (Signed)
OT Cancellation Note  Patient Details Name: Patricia Murillo MRN: [id number] DOB: 04-15-1946   Cancelled Treatment:    Reason Eval/Treat Not Completed: Other (comment).  Not ready per nursing:  Pt had rapid response called early this am.  Will hopefully check back tomorrow  Chrisanne Loose 09/21/2018, 12:38 PM  Lesle Chris, OTR/L Acute Rehabilitation Services [phone] [facility] pager [phone] office 09/21/2018

## 2018-09-21 NOTE — Progress Notes (Signed)
K Schorr notified, SBP> 170, exp. Wheezes throughout, temp 100.1, order obtained for xopenex neb, RT notified. No signs of actue distress. Will continue to monitor. See flowsheet for add'd details. Neomia Dear RN

## 2018-09-21 NOTE — Significant Event (Signed)
Rapid Response Event Note  Overview: Time Called: 3748 Arrival Time: 0250 Event Type: Respiratory  Initial Focused Assessment:  Rapid Response called in reference to increased WOB with tachypnea, requesting RRT RN to assess patient.  Pt resting in bed, alert and oriented x 4, no obvious signs of distress noted.  Pt able to talk in full sentences, however, voice is raspy with occasional exp. wheeze.   Lungs sound Rhonchi throughout bilat with crackles noted in the bases.  Also 2 to 3+ edema noted in lower extremities.  Primary RN advised that RT came in and gave breathing treatment, however, pt too weak to cough up any sputum.  Pt also unable to perform IS, due to weakness.  Also noted in chart that pt is aspirating, recommend at this point to keep NPO, but get NP's opinion on this.  Also recommend Chest CXR (last CXR on 12/14) and possible need for a dose of IV Lasix.  Pt not in any major distress, but could easily see patient heading in that direction.   Interventions:  RRT RN Assessment, CXR, Recommend NPO and Lasix.  Plan of Care (if not transferred):  If any deterioration or concern in patient's condition, please call rapid response nurse back at [phone].  Event Summary:  Name of Physician Notified: Fransisca Connors at 0300  Outcome: Stayed in room and stabalized  Event End Time: [place] ICU/SD [facility] / [place] / Rapid Response Nurse Rapid Response Number:  [phone]

## 2018-09-21 NOTE — Progress Notes (Signed)
Pharmacy Antibiotic Note  Patricia Murillo is a 72 y.o. female admitted on 09/07/2018 with febrile neutropenia.  Pharmacy has been consulted for cefepime dosing.  Plan: Cefepime 2 gr IV q8h  Monitor clinical course, renal function, cultures as available   Height: 5\' 6"  (167.6 cm) Weight: 277 lb 12.5 oz (126 kg) IBW/kg (Calculated) : 59.3  Temp (24hrs), Avg:100.1 F (37.8 C), Min:98.5 F (36.9 C), Max:102.2 F (39 C)  Recent Labs  Lab 09/17/18 0600 09/18/18 0422 09/19/18 0401 09/20/18 0450 09/21/18 0607  WBC 1.6* 0.6* 0.1* 0.1* 0.4*  CREATININE 0.41* 0.38* 0.40* 0.36* 0.46    Estimated Creatinine Clearance: 87.6 mL/min (by C-G formula based on SCr of 0.46 mg/dL).    Allergies  Allergen Reactions  . Apixaban Other (See Comments)    Rectal bleeding within 2 days of taking medication    Antimicrobials this admission: 12/4 CTX >> 12/8 12/4 flagyl >> 12/5 12/7 acyclovir for HSV >> 12/9 Unasyn >> 12/12 12/18 cefepime >>   Dose adjustments this admission:    Microbiology results: 12/4 BCx x2: NGF 12/4 UCx: neg FINAL 12/18 BCx:  12/18 UCx:   Thank you for allowing pharmacy to be a part of this patient's care.   Royetta Asal, PharmD, BCPS Pager [phone] 09/21/2018 10:20 AM

## 2018-09-21 NOTE — Progress Notes (Signed)
Patricia Murillo   DOB:September 30, 1946   DG#:[id number]   VZD#:[id number]  Hem/Onc follow up note   Subjective: Pt spiked a fever with temperature 102.2 yesterday, antibiotics cefepime was started.  She is afebrile today.  Overall feels better, still very fatigued, appetite also decreased.  Objective:  Vitals:   09/21/18 1336 09/21/18 1341  BP: (!) 148/66   Pulse: 92   Resp: 20   Temp: 99.4 F (37.4 C)   SpO2: 100% 98%    Body mass index is 44.83 kg/m.  Intake/Output Summary (Last 24 hours) at 09/21/2018 1705 Last data filed at 09/21/2018 1544 Gross per 24 hour  Intake 600 ml  Output 1000 ml  Net -400 ml     Sclerae unicteric  Oropharynx clear  (+) bulky adenopathy at left axilla and left upper front chest have improved   Lungs clear -- scatter wheezing and rhonchi through both lungs   Heart regular rate and rhythm  Abdomen benign, soft   MSK no focal spinal tenderness, (+) severe edema of the left upper arm and leg, and bilateral lower extremity  Neuro: alert, and follow commands. Pupil right bigger than left, both reactive, mild (+) ptosis of left eye. Her left upper extremity strength is significantly reduced. She moves all extremities    CBG (last 3)  No results for input(s): GLUCAP in the last 72 hours.   Labs:  CBC Latest Ref Rng & Units 09/21/2018 09/20/2018 09/19/2018  WBC 4.0 - 10.5 K/uL 0.4(LL) 0.1(LL) 0.1(LL)  Hemoglobin 12.0 - 15.0 g/dL 8.0(L) 8.2(L) 8.8(L)  Hematocrit 36.0 - 46.0 % 24.5(L) 25.2(L) 26.7(L)  Platelets 150 - 400 K/uL 111(L) 64(L) 60(L)    CMP Latest Ref Rng & Units 09/21/2018 09/20/2018 09/19/2018  Glucose 70 - 99 mg/dL 96 110(H) 110(H)  BUN 8 - 23 mg/dL 13 13 15   Creatinine 0.44 - 1.00 mg/dL 0.46 0.36(L) 0.40(L)  Sodium 135 - 145 mmol/L 133(L) 135 140  Potassium 3.5 - 5.1 mmol/L 3.5 3.8 3.6  Chloride 98 - 111 mmol/L 94(L) 98 98  CO2 22 - 32 mmol/L 29 29 35(H)  Calcium 8.9 - 10.3 mg/dL 7.7(L) 8.4(L) 8.7(L)  Total Protein 6.5 - 8.1 g/dL -  5.0(L) 5.2(L)  Total Bilirubin 0.3 - 1.2 mg/dL - 2.1(H) 2.1(H)  Alkaline Phos 38 - 126 U/L - 61 61  AST 15 - 41 U/L - 16 23  ALT 0 - 44 U/L - 18 20     Urine Studies No results for input(s): UHGB, CRYS in the last 72 hours.  Invalid input(s): UACOL, UAPR, USPG, UPH, UTP, UGL, UKET, UBIL, UNIT, UROB, [place], UEPI, UWBC, [place], [place], [place], [place], [place]  Basic Metabolic Panel: Recent Labs  Lab 09/17/18 0600 09/18/18 0422 09/19/18 0401 09/20/18 0450 09/21/18 0607  NA 142 141 140 135 133*  K 3.5 3.1* 3.6 3.8 3.5  CL 104 99 98 98 94*  CO2 29 34* 35* 29 29  GLUCOSE 94 105* 110* 110* 96  BUN 23 17 15 13 13   CREATININE 0.41* 0.38* 0.40* 0.36* 0.46  CALCIUM 9.1 8.9 8.7* 8.4* 7.7*  MG  --  1.8 2.1  --   --    GFR Estimated Creatinine Clearance: 87.6 mL/min (by C-G formula based on SCr of 0.46 mg/dL). Liver Function Tests: Recent Labs  Lab 09/15/18 0555 09/16/18 0500 09/19/18 0401 09/20/18 0450  AST 21 23 23 16   ALT 14 14 20 18   ALKPHOS 42 43 61 61  BILITOT 1.3* 1.2 2.1* 2.1*  PROT 4.3* 5.0* 5.2* 5.0*  ALBUMIN 2.9* 3.7 3.5 3.2*   No results for input(s): LIPASE, AMYLASE in the last 168 hours. No results for input(s): AMMONIA in the last 168 hours. Coagulation profile No results for input(s): INR, PROTIME in the last 168 hours.  CBC: Recent Labs  Lab 09/16/18 0500 09/17/18 0600 09/18/18 0422 09/19/18 0401 09/20/18 0450 09/21/18 0607  WBC 2.8* 1.6* 0.6* 0.1* 0.1* 0.4*  NEUTROABS 2.6 1.4* 0.5*  --   --  0.1*  HGB 6.7* 8.7* 8.9* 8.8* 8.2* 8.0*  HCT 21.3* 26.7* 26.8* 26.7* 25.2* 24.5*  MCV 91.0 88.1 86.5 87.5 88.7 87.5  PLT 44* 54* 59* 60* 64* 111*   Cardiac Enzymes: No results for input(s): CKTOTAL, CKMB, CKMBINDEX, TROPONINI in the last 168 hours. BNP: Invalid input(s): POCBNP CBG: Recent Labs  Lab 09/16/18 0737  GLUCAP 84   D-Dimer No results for input(s): DDIMER in the last 72 hours. Hgb A1c No results for input(s): HGBA1C in the last 72 hours. Lipid  Profile No results for input(s): CHOL, HDL, LDLCALC, TRIG, CHOLHDL, LDLDIRECT in the last 72 hours. Thyroid function studies No results for input(s): TSH, T4TOTAL, T3FREE, THYROIDAB in the last 72 hours.  Invalid input(s): FREET3 Anemia work up No results for input(s): VITAMINB12, FOLATE, FERRITIN, TIBC, IRON, RETICCTPCT in the last 72 hours. Microbiology Recent Results (from the past 240 hour(s))  Culture, blood (routine x 2)     Status: None (Preliminary result)   Collection Time: 09/21/18  8:13 AM  Result Value Ref Range Status   Specimen Description   Final    BLOOD RIGHT ANTECUBITAL Performed at [facility], [place] [address] [place], [place] [address]    Special Requests   Final    BOTTLES DRAWN AEROBIC AND ANAEROBIC Blood Culture adequate volume Performed at [place] [address] [place], [place] [address]    Culture PENDING  Incomplete   Report Status PENDING  Incomplete      Studies:  Dg Chest Port 1 View  Result Date: 09/21/2018 CLINICAL DATA:  Tachypnea. EXAM: PORTABLE CHEST 1 VIEW COMPARISON:  Chest radiographs 2 days prior, chest CT 107-May-202019 FINDINGS: Right-sided pacemaker in place. Right upper extremity PICC in place, tip in the proximal SVC. The patient is significantly rotated. Bilateral pleural effusions and associated airspace disease, likely progressed at the left lung base. Nodular density in the periphery of the left lung again seen. Large retrocardiac hiatal hernia. Unchanged heart size and mediastinal contours. No pulmonary edema. IMPRESSION: Bilateral pleural effusions, increased on the left. Unchanged radiographic appearance of large hiatal hernia. Peripheral nodular opacity as seen on prior chest CT. Additional pulmonary nodules on CT are not visualized radiographically Electronically Signed   By: Keith Rake M.D.   On: 09/21/2018 03:42    Assessment: 72 y.o. with newly diagnosed high-grade B-cell lymphoma, recently  discharged from hospital after biopsy, presented to the emergency room with progressive weakness, dyspnea, chills and fever, anorexia, and extremity edema.  1.  Neutropenic fever, secondary to chemo, granix started on 12/15, on cefepime 2.  Acute hypoxic respite failure, secondary to lymphoma in lungs, versus infections, stable, she is on 2L [place] oxygen  3.  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 Worsening anemia secondary to chemo, probable mild GI  bleeding  6.  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 Hypercalcemia secondary to malignancy, s/p zometa 12/5, resolved now  8. Severe arm and leg edema, secondary to severe adenopathy and hypoalbuminemia, improving  9. Deconditioning  10 left ptosis and dilated right pupil  11.  thrombocytopenia, secondary to lymphoma and chemotherapy, improving  12. Volume overload, received aggressive iv last on 12/15   Plan:  -Agree with broad antibiotics, cultures are pending. -Continue Granix, her white count has improved today -Hemoglobin stable today, if further drop below 8, consider 2 units of blood transfusion, irradiated, with Lasix -Her previous biopsy fish test was back, negative for double or triple hit, her final diagnosis is diffuse large B-cell lymphoma, activated B-cell type.  I discussed with patient and her daughter, will continue R-CHOP in the future, no need to escalate her chemo, or CNS prophylaxis. -continue supportive care, I encourage her to turn frequently when she is in bed -I will continue to f/u closely    Truitt Merle  09/21/2018

## 2018-09-21 NOTE — Progress Notes (Signed)
Rapid response notified due to pt. Vital signs, exp weezes, current RR 28 per min, 172/79, 112, req'ing evaluation. Details provided. S/w Gilmer Mor, RN. Pt. To be evaluated. Will continue to monitor pt closely. Neomia Dear RN

## 2018-09-21 NOTE — Progress Notes (Signed)
Notified lab, req'ed lab culture vials. Will send. Neomia Dear RN

## 2018-09-21 NOTE — Progress Notes (Signed)
PT Cancellation Note  Patient Details Name: CHALA GUL MRN: [id number] DOB: [birth year]   Cancelled Treatment:    Reason Eval/Treat Not Completed: Fatigue/lethargy limiting ability to participate;Medical issues which prohibited therapy(noted rapid response this morning, RN requested PT hold at present 2* pt is very fatigued and in pain. Will follow. )   Philomena Doheny PT 09/21/2018  Acute Rehabilitation Services Pager [phone] Office [phone]

## 2018-09-22 DIAGNOSIS — B965 Pseudomonas (aeruginosa) (mallei) (pseudomallei) as the cause of diseases classified elsewhere: Secondary | ICD-10-CM

## 2018-09-22 DIAGNOSIS — R7881 Bacteremia: Secondary | ICD-10-CM

## 2018-09-22 DIAGNOSIS — L8990 Pressure ulcer of unspecified site, unspecified stage: Secondary | ICD-10-CM

## 2018-09-22 LAB — BLOOD CULTURE ID PANEL (REFLEXED)

## 2018-09-22 LAB — CBC WITH DIFFERENTIAL/PLATELET
Abs Immature Granulocytes: 0.03 10*3/uL (ref 0.00–0.07)
Basophils Absolute: 0 10*3/uL (ref 0.0–0.1)
Basophils Relative: 0 %
EOS ABS: 0 10*3/uL (ref 0.0–0.5)
EOS PCT: 0 %
HCT: 22.1 % — ABNORMAL LOW (ref 36.0–46.0)
Hemoglobin: 7 g/dL — ABNORMAL LOW (ref 12.0–15.0)
Immature Granulocytes: 1 %
Lymphocytes Relative: 1 %
Lymphs Abs: 0 10*3/uL — ABNORMAL LOW (ref 0.7–4.0)
MCH: 28.3 pg (ref 26.0–34.0)
MCHC: 31.7 g/dL (ref 30.0–36.0)
MCV: 89.5 fL (ref 80.0–100.0)
Monocytes Absolute: 0.5 10*3/uL (ref 0.1–1.0)
Monocytes Relative: 22 %
Neutro Abs: 1.6 10*3/uL — ABNORMAL LOW (ref 1.7–7.7)
Neutrophils Relative %: 76 %
Platelets: 129 10*3/uL — ABNORMAL LOW (ref 150–400)
RBC: 2.47 MIL/uL — ABNORMAL LOW (ref 3.87–5.11)
RDW: 15.9 % — ABNORMAL HIGH (ref 11.5–15.5)
WBC: 2.2 10*3/uL — ABNORMAL LOW (ref 4.0–10.5)
nRBC: 0 % (ref 0.0–0.2)

## 2018-09-22 LAB — LACTATE DEHYDROGENASE: LDH: 143 U/L (ref 98–192)

## 2018-09-22 LAB — COMPREHENSIVE METABOLIC PANEL
ALBUMIN: 2.4 g/dL — AB (ref 3.5–5.0)
ALT: 14 U/L (ref 0–44)
AST: 9 U/L — ABNORMAL LOW (ref 15–41)
Alkaline Phosphatase: 57 U/L (ref 38–126)
Anion gap: 4 — ABNORMAL LOW (ref 5–15)
BUN: 16 mg/dL (ref 8–23)
CO2: 27 mmol/L (ref 22–32)
Calcium: 7.9 mg/dL — ABNORMAL LOW (ref 8.9–10.3)
Chloride: 106 mmol/L (ref 98–111)
Creatinine, Ser: 0.41 mg/dL — ABNORMAL LOW (ref 0.44–1.00)
GFR calc Af Amer: >60 mL/min (ref >60)
GFR calc non Af Amer: >60 mL/min (ref >60)
Glucose, Bld: 89 mg/dL (ref 70–99)
POTASSIUM: 3.1 mmol/L — AB (ref 3.5–5.1)
SODIUM: 137 mmol/L (ref 135–145)
Total Bilirubin: 1.9 mg/dL — ABNORMAL HIGH (ref 0.3–1.2)
Total Protein: 4.6 g/dL — ABNORMAL LOW (ref 6.5–8.1)

## 2018-09-22 LAB — RETICULOCYTES
Immature Retic Fract: 12.6 % (ref 2.3–15.9)
RBC.: 2.47 MIL/uL — ABNORMAL LOW (ref 3.87–5.11)
Retic Count, Absolute: 69.9 10*3/uL (ref 19.0–186.0)
Retic Ct Pct: 2.8 % (ref 0.4–3.1)

## 2018-09-22 LAB — PREPARE RBC (CROSSMATCH)

## 2018-09-22 MED ORDER — MEGESTROL ACETATE 400 MG/10ML PO SUSP
400.0000 mg | Freq: Every day | ORAL | Status: DC
  Administered 2018-09-22 – 2018-10-05 (×14): 400 mg via ORAL
  Filled 2018-09-22 (×14): qty 10

## 2018-09-22 MED ORDER — FUROSEMIDE 10 MG/ML IJ SOLN
40.0000 mg | Freq: Once | INTRAMUSCULAR | Status: AC
  Administered 2018-09-23: 40 mg via INTRAVENOUS
  Filled 2018-09-22: qty 4

## 2018-09-22 MED ORDER — COLLAGENASE 250 UNIT/GM EX OINT
TOPICAL_OINTMENT | Freq: Every day | CUTANEOUS | Status: DC
  Administered 2018-09-23 – 2018-10-04 (×12): via TOPICAL
  Filled 2018-09-22: qty 90

## 2018-09-22 MED ORDER — SODIUM CHLORIDE 0.9% IV SOLUTION: Freq: Once | INTRAVENOUS | Status: DC

## 2018-09-22 MED ORDER — PREDNISONE 20 MG PO TABS
20.0000 mg | ORAL_TABLET | Freq: Every day | ORAL | Status: DC
  Administered 2018-09-23 – 2018-09-29 (×7): 20 mg via ORAL
  Filled 2018-09-22 (×7): qty 1

## 2018-09-22 NOTE — Progress Notes (Signed)
Patricia Murillo   DOB:1946-09-30   HY#:[id number]   [phone]  Hem/Onc follow up note   Subjective: Pt is feeling better today, remains to be afebrile, neutropenia has improved.  Her hemoglobin dropped to 7.0 this morning, will get blood transfusion.  No other new complaints.  Objective:  Vitals:   09/22/18 1300 09/22/18 1515  BP: 127/76   Pulse: 78   Resp: 18   Temp: 98 F (36.7 C)   SpO2: 99% 98%    Body mass index is 38.41 kg/m.  Intake/Output Summary (Last 24 hours) at 09/22/2018 1749 Last data filed at 09/22/2018 1600 Gross per 24 hour  Intake 1160 ml  Output 1150 ml  Net 10 ml     Sclerae unicteric  Oropharynx clear  (+) bulky adenopathy at left axilla and left upper front chest have improved   Lungs clear -- scatter wheezing and rhonchi through both lungs   Heart regular rate and rhythm  Abdomen benign, soft   MSK no focal spinal tenderness, (+) severe edema of the left upper arm and leg, and bilateral lower extremity  Neuro: alert, and follow commands. Pupil right bigger than left, both reactive, mild (+) ptosis of left eye. Her left upper extremity strength is significantly reduced. She moves all extremities    CBG (last 3)  No results for input(s): GLUCAP in the last 72 hours.   Labs:  CBC Latest Ref Rng & Units 09/22/2018 09/21/2018 09/20/2018  WBC 4.0 - 10.5 K/uL 2.2(L) 0.4(LL) 0.1(LL)  Hemoglobin 12.0 - 15.0 g/dL 7.0(L) 8.0(L) 8.2(L)  Hematocrit 36.0 - 46.0 % 22.1(L) 24.5(L) 25.2(L)  Platelets 150 - 400 K/uL 129(L) 111(L) 64(L)    CMP Latest Ref Rng & Units 09/22/2018 09/21/2018 09/20/2018  Glucose 70 - 99 mg/dL 89 96 110(H)  BUN 8 - 23 mg/dL 16 13 13   Creatinine 0.44 - 1.00 mg/dL 0.41(L) 0.46 0.36(L)  Sodium 135 - 145 mmol/L 137 133(L) 135  Potassium 3.5 - 5.1 mmol/L 3.1(L) 3.5 3.8  Chloride 98 - 111 mmol/L 106 94(L) 98  CO2 22 - 32 mmol/L 27 29 29   Calcium 8.9 - 10.3 mg/dL 7.9(L) 7.7(L) 8.4(L)  Total Protein 6.5 - 8.1 g/dL 4.6(L) - 5.0(L)   Total Bilirubin 0.3 - 1.2 mg/dL 1.9(H) - 2.1(H)  Alkaline Phos 38 - 126 U/L 57 - 61  AST 15 - 41 U/L 9(L) - 16  ALT 0 - 44 U/L 14 - 18     Urine Studies No results for input(s): UHGB, CRYS in the last 72 hours.  Invalid input(s): UACOL, UAPR, USPG, UPH, UTP, UGL, UKET, UBIL, UNIT, UROB, [place], UEPI, UWBC, Duwayne Heck [place], [place]  Basic Metabolic Panel: Recent Labs  Lab 09/18/18 0422 09/19/18 0401 09/20/18 0450 09/21/18 0607 09/22/18 0546  NA 141 140 135 133* 137  K 3.1* 3.6 3.8 3.5 3.1*  CL 99 98 98 94* 106  CO2 34* 35* 29 29 27   GLUCOSE 105* 110* 110* 96 89  BUN 17 15 13 13 16   CREATININE 0.38* 0.40* 0.36* 0.46 0.41*  CALCIUM 8.9 8.7* 8.4* 7.7* 7.9*  MG 1.8 2.1  --   --   --    GFR Estimated Creatinine Clearance: 80.2 mL/min (A) (by C-G formula based on SCr of 0.41 mg/dL (L)). Liver Function Tests: Recent Labs  Lab 09/16/18 0500 09/19/18 0401 09/20/18 0450 09/22/18 0546  AST 23 23 16  9*  ALT 14 20 18 14   ALKPHOS 43 61 61 57  BILITOT 1.2 2.1*  2.1* 1.9*  PROT 5.0* 5.2* 5.0* 4.6*  ALBUMIN 3.7 3.5 3.2* 2.4*   No results for input(s): LIPASE, AMYLASE in the last 168 hours. No results for input(s): AMMONIA in the last 168 hours. Coagulation profile No results for input(s): INR, PROTIME in the last 168 hours.  CBC: Recent Labs  Lab 09/16/18 0500 09/17/18 0600 09/18/18 0422 09/19/18 0401 09/20/18 0450 09/21/18 0607 09/22/18 0546  WBC 2.8* 1.6* 0.6* 0.1* 0.1* 0.4* 2.2*  NEUTROABS 2.6 1.4* 0.5*  --   --  0.1* 1.6*  HGB 6.7* 8.7* 8.9* 8.8* 8.2* 8.0* 7.0*  HCT 21.3* 26.7* 26.8* 26.7* 25.2* 24.5* 22.1*  MCV 91.0 88.1 86.5 87.5 88.7 87.5 89.5  PLT 44* 54* 59* 60* 64* 111* 129*   Cardiac Enzymes: No results for input(s): CKTOTAL, CKMB, CKMBINDEX, TROPONINI in the last 168 hours. BNP: Invalid input(s): POCBNP CBG: Recent Labs  Lab 09/16/18 0737  GLUCAP 84   D-Dimer No results for input(s): DDIMER in the last 72 hours. Hgb A1c No results for  input(s): HGBA1C in the last 72 hours. Lipid Profile No results for input(s): CHOL, HDL, LDLCALC, TRIG, CHOLHDL, LDLDIRECT in the last 72 hours. Thyroid function studies No results for input(s): TSH, T4TOTAL, T3FREE, THYROIDAB in the last 72 hours.  Invalid input(s): FREET3 Anemia work up Recent Labs    09/22/18 0546  RETICCTPCT 2.8   Microbiology Recent Results (from the past 240 hour(s))  Culture, blood (routine x 2)     Status: None (Preliminary result)   Collection Time: 09/21/18  8:13 AM  Result Value Ref Range Status   Specimen Description   Final    BLOOD RIGHT ANTECUBITAL Performed at [facility], [address], [place], [place] [address]    Special Requests   Final    BOTTLES DRAWN AEROBIC AND ANAEROBIC Blood Culture adequate volume Performed at [place] [address], [place], [place] [address]    Culture  Setup Time   Final    GRAM NEGATIVE RODS AEROBIC BOTTLE ONLY Organism ID to follow CRITICAL RESULT CALLED TO, READ BACK BY AND VERIFIED WITHLavell Luster [facility] 4081 09/22/18 A BROWNING    Culture   Final    NO GROWTH 1 DAY Performed at [facility], [place] [address] [place], [place] [address]    Report Status PENDING  Incomplete  Blood Culture ID Panel (Reflexed)     Status: Abnormal   Collection Time: 09/21/18  8:13 AM  Result Value Ref Range Status   Enterococcus species NOT DETECTED NOT DETECTED Final   Listeria monocytogenes NOT DETECTED NOT DETECTED Final   Staphylococcus species NOT DETECTED NOT DETECTED Final   Staphylococcus aureus (BCID) NOT DETECTED NOT DETECTED Final   Streptococcus species NOT DETECTED NOT DETECTED Final   Streptococcus agalactiae NOT DETECTED NOT DETECTED Final   Streptococcus pneumoniae NOT DETECTED NOT DETECTED Final   Streptococcus pyogenes NOT DETECTED NOT DETECTED Final   Acinetobacter baumannii NOT DETECTED NOT DETECTED Final   Enterobacteriaceae species NOT DETECTED NOT DETECTED  Final   Enterobacter cloacae complex NOT DETECTED NOT DETECTED Final   Escherichia coli NOT DETECTED NOT DETECTED Final   Klebsiella oxytoca NOT DETECTED NOT DETECTED Final   Klebsiella pneumoniae NOT DETECTED NOT DETECTED Final   Proteus species NOT DETECTED NOT DETECTED Final   Serratia marcescens NOT DETECTED NOT DETECTED Final   Carbapenem resistance NOT DETECTED NOT DETECTED Final   Haemophilus influenzae NOT DETECTED NOT DETECTED Final  Neisseria meningitidis NOT DETECTED NOT DETECTED Final   Pseudomonas aeruginosa DETECTED (A) NOT DETECTED Final    Comment: CRITICAL RESULT CALLED TO, READ BACK BY AND VERIFIED WITHLavell Luster [facility] 9702 09/22/18 A BROWNING    Candida albicans NOT DETECTED NOT DETECTED Final   Candida glabrata NOT DETECTED NOT DETECTED Final   Candida krusei NOT DETECTED NOT DETECTED Final   Candida parapsilosis NOT DETECTED NOT DETECTED Final   Candida tropicalis NOT DETECTED NOT DETECTED Final    Comment: Performed at [facility], [place] [address], [place], [place] [address]  Culture, blood (routine x 2)     Status: None (Preliminary result)   Collection Time: 09/21/18  8:56 AM  Result Value Ref Range Status   Specimen Description   Final    BLOOD RIGHT HAND Performed at [place] [address], [place], [place] [address]    Special Requests   Final    BOTTLES DRAWN AEROBIC AND ANAEROBIC Blood Culture results may not be optimal due to an inadequate volume of blood received in culture bottles Performed at [place] [address], [place], [place] [address]    Culture   Final    NO GROWTH 1 DAY Performed at [facility], [place] [address], [place], [place] [address]    Report Status PENDING  Incomplete  Urine Culture     Status: Abnormal (Preliminary result)   Collection Time: 09/21/18  3:43 PM  Result Value Ref Range Status   Specimen Description   Final    URINE, CLEAN CATCH Performed at [facility], [place] [address] [place], [place] [address]    Special Requests   Final    Immunocompromised Performed at [facility], [place] [address], [place], [place] [address]    Culture 20,000 COLONIES/mL PSEUDOMONAS AERUGINOSA (A)  Final   Report Status PENDING  Incomplete      Studies:  Dg Chest Port 1 View  Result Date: 09/21/2018 CLINICAL DATA:  Tachypnea. EXAM: PORTABLE CHEST 1 VIEW COMPARISON:  Chest radiographs 2 days prior, chest CT 107/06/202019 FINDINGS: Right-sided pacemaker in place. Right upper extremity PICC in place, tip in the proximal SVC. The patient is significantly rotated. Bilateral pleural effusions and associated airspace disease, likely progressed at the left lung base. Nodular density in the periphery of the left lung again seen. Large retrocardiac hiatal hernia. Unchanged heart size and mediastinal contours. No pulmonary edema. IMPRESSION: Bilateral pleural effusions, increased on the left. Unchanged radiographic appearance of large hiatal hernia. Peripheral nodular opacity as seen on prior chest CT. Additional pulmonary nodules on CT are not visualized radiographically Electronically Signed   By: Keith Rake M.D.   On: 09/21/2018 03:42    Assessment: 72 y.o. with newly diagnosed high-grade B-cell lymphoma, recently discharged from hospital after biopsy, presented to the emergency room with progressive weakness, dyspnea, chills and fever, anorexia, and extremity edema.  1.  Neutropenic fever, secondary to chemo, granix started on 12/15, on cefepime 2.  Acute hypoxic respite failure, secondary to lymphoma in lungs, versus infections, stable, she is on 2L [place] oxygen  3.  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  Worsening anemia secondary to chemo, probable mild GI bleeding  6.  Third-degree AV block,  status post pacemaker on right chest  7. Hypercalcemia secondary to malignancy, s/p zometa 12/5, resolved now  8. Severe arm and leg edema, secondary to severe adenopathy and hypoalbuminemia, improving  9. Deconditioning  10 left ptosis and dilated right pupil  11.  thrombocytopenia, secondary to lymphoma and chemotherapy, newly resolved  12. Volume overload, received aggressive iv last on 12/15  13.  Pseudomonas UTI and bacteremia  Plan:  -Both her urine and blood culture has grown Pseudomonas, she is currently on cefepime, management per primary team  -Her neutropenia has improved, ANC 1.6K today, OK to cancel granix injection tomorrow if ANC remains to be >1.5K  -Her hemolysis has been controlled based on her lab today, normal reticulocyte count, normal LDH, I will decrease her prednisone from 40 mg to 20 mg from tomorrow -Please consider 2 units of red blood cell transfusion with Lasix today, with irradiated blood -continue other supportive care  -I will f/u closely    Truitt Merle  09/22/2018

## 2018-09-22 NOTE — Progress Notes (Addendum)
Patient Demographics:    Patricia Murillo, is a 72 y.o. female, DOB - 03-Nov-1945, XLK:[id number]  Admit date - 09/07/2018   Admitting Physician Mauricio Gerome Apley, MD  Outpatient Primary MD for the patient is Patient, No Pcp Per  LOS - 45   Chief Complaint  Patient presents with  . 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 with newly diagnosed high-grade B-cell lymphoma, recently discharged from hospital after biopsy, presented to the emergency room with progressive weakness, dyspnea, chills and fever, anorexia, and extremity edema.   PLan:-  1) Neutropenic fevers/pseudomonas aeruginosa bacteremia--currently afebrile, WBC up to 2.2 with ANC of 1.6 preliminary blood cultures from 09/21/2018 with pseudomonas aeruginosa,, chest x-ray without acute findings, continue IV cefepime started 09/21/2018, continue Granix started on 09/18/18 as per oncology, verbal consultation on 09/22/18 with Dr Talbot Grumbling and [facility] Pharmacist   2)Diffuse large B-cell lymphoma, activated B-cell type--- per Dr Burr Medico [facility] should be continued once acute issues resolve  3)Thrombocytopenia--due to lymphoma and chemo , platelet count trending up, platelets now > 129 k,   4)Autoimmune hemolytic anemia, 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 Burr Medico to give opinion regarding possible transfusion  5) hypercalcemia secondary to malignancy- s/p Zometa  6)Acute hypoxic respiratory failure. Likely related to progressive high-grade B-cell lymphoma with pulmonary involvement comp located by suspected aspiration--- respiratory status actually improving, started on IV cefepime on 09/21/2018 as above #1  7) acute toxic and metabolic encephalopathy----multi-factorial including malignancy, hypercalcemia , improving  8) moderate protein caloric malnutrition----due to underlying malignancy and poor oral intake, patient has low albumin and edema--- nutritional supplements advised, give Megace  9) significant left upper extremity swelling and weakness----due to lymphatic obstruction by high-grade lymphoma, venous Dopplers negative of the DVT on 08/29/2018  10)Pupil Discrepancy -Noted to be new -No Vision loss -Head CT Negative -Continue to Monitor   Disposition/Need for in-Hospital Stay- patient unable to be discharged at this time due to neutropenic fevers requiring IV antibiotics for Pseudomonas bacteremia pending final cultures patient with significant neutropenia and at risk for significant systemic infection  Code Status : FULL    Disposition Plan  : TBD  Consults  :  oncology   DVT Prophylaxis  :  TEDs - SCDs   Lab Results  Component Value Date   PLT 129 (L) 09/22/2018    Inpatient Medications  Scheduled Meds: . sodium chloride   Intravenous Once  . sodium chloride   Intravenous Once  . acyclovir  400 mg Oral BID  .  allopurinol  300 mg Oral Daily  . cholecalciferol  2,000 Units Oral Daily  . collagenase   Topical Daily  . famotidine  20 mg Oral Daily  . feeding supplement (ENSURE ENLIVE)  237 mL Oral BID BM  . fluticasone  2 spray Each Nare Daily  . folic acid  1 mg Oral Daily  . levalbuterol  0.63 mg Nebulization TID  . loratadine  10 mg  Oral Daily  . mouth rinse  15 mL Mouth Rinse BID  . mupirocin ointment   Topical BID  . pantoprazole (PROTONIX) IV  40 mg Intravenous Q12H  . polyethylene glycol  17 g Oral Daily  . predniSONE  40 mg Oral Q breakfast  . senna-docusate  1 tablet Oral BID  . Tbo-filgastrim (GRANIX) SQ  480 mcg Subcutaneous Daily   Continuous Infusions: . sodium chloride Stopped (09/12/18 1805)  . sodium chloride    . ceFEPime (MAXIPIME) IV 2 g (09/22/18 1224)   PRN Meds:.sodium chloride, sodium chloride, acetaminophen **OR** [DISCONTINUED] acetaminophen, alum & mag hydroxide-simeth **AND** lidocaine, benzonatate, guaiFENesin-dextromethorphan, HYDROcodone-acetaminophen, lidocaine, lip balm, meclizine, ondansetron **OR** ondansetron (ZOFRAN) IV, sodium chloride flush    Anti-infectives (From admission, onward)   Start     Dose/Rate Route Frequency Ordered Stop   09/21/18 1200  ceFEPIme (MAXIPIME) 2 g in sodium chloride 0.9 % 100 mL IVPB     2 g 200 mL/hr over 30 Minutes Intravenous Every 8 hours 09/21/18 1027     09/12/18 1600  ampicillin-sulbactam (UNASYN) 1.5 g in sodium chloride 0.9 % 100 mL IVPB  Status:  Discontinued     1.5 g 200 mL/hr over 30 Minutes Intravenous Every 6 hours 09/12/18 1408 09/15/18 0753   09/10/18 1000  acyclovir (ZOVIRAX) 200 MG capsule 400 mg     400 mg Oral 2 times daily 09/10/18 0754     09/07/18 2000  cefTRIAXone (ROCEPHIN) 1 g in sodium chloride 0.9 % 100 mL IVPB  Status:  Discontinued     1 g 200 mL/hr over 30 Minutes Intravenous Every 24 hours 09/07/18 1844 09/12/18 0940   09/07/18 0830  ceFEPIme (MAXIPIME) 2 g in sodium chloride 0.9 % 100 mL IVPB     2 g 200 mL/hr over 30 Minutes Intravenous  Once 09/07/18 0820 09/07/18 0912   09/07/18 0830  metroNIDAZOLE (FLAGYL) IVPB 500 mg  Status:  Discontinued     500 mg 100 mL/hr over 60 Minutes Intravenous Every 8 hours 09/07/18 0820 09/08/18 1812   09/07/18 0830  vancomycin (VANCOCIN) IVPB 1000 mg/200 mL premix     1,000  mg 200 mL/hr over 60 Minutes Intravenous  Once 09/07/18 0820 09/07/18 1157        Objective:   Vitals:   09/22/18 0523 09/22/18 0901 09/22/18 1300 09/22/18 1515  BP: (!) 139/46  127/76   Pulse: 81  78   Resp: 20  18   Temp: 98.3 F (36.8 C)  98 F (36.7 C)   TempSrc: Oral  Oral   SpO2: 99% 98% 99% 98%  Weight: 108 kg     Height:        Wt Readings from Last 3 Encounters:  09/22/18 108 kg  08/30/18 90.2 kg  08/04/18 83.5 kg     Intake/Output Summary (Last 24 hours) at 09/22/2018 1650 Last data filed at 09/22/2018 1600 Gross per 24 hour  Intake 1260 ml  Output 1150 ml  Net 110 ml     Physical Exam Patient is examined daily including  today on 09/22/18 , exams remain the same as of yesterday except that has changed   Gen:- Awake, more Alert,  In no apparent distress  HEENT:- [place].AT, No sclera icterus Neck-Supple Neck,No JVD,.  Lungs-  CTAB , fair symmetrical air movement CV- S1, S2 normal, regular  Abd-  +ve B.Sounds, Abd Soft, No tenderness,    Extremity/Skin:- pedal pulses present,significant edema of left upper extremity, bilateral lower extremity edema improving slowly, right upper extremity PICC line site w/o evidence of inflammation or infection Psych-affect is appropriate, oriented x3 Neuro-no new focal deficits, no tremors   Data Review:   Micro Results Recent Results (from the past 240 hour(s))  Culture, blood (routine x 2)     Status: None (Preliminary result)   Collection Time: 09/21/18  8:13 AM  Result Value Ref Range Status   Specimen Description   Final    BLOOD RIGHT ANTECUBITAL Performed at [facility], [place] [address], [place], [place] [address]    Special Requests   Final    BOTTLES DRAWN AEROBIC AND ANAEROBIC Blood Culture adequate volume Performed at [facility] [address], [place], [place] [address]    Culture  Setup Time   Final    GRAM NEGATIVE RODS AEROBIC BOTTLE ONLY Organism ID to follow CRITICAL  RESULT CALLED TO, READ BACK BY AND VERIFIED WITHLavell Luster [facility] [id number] 09/22/18 A BROWNING    Culture   Final    NO GROWTH 1 DAY Performed at [facility], [place] [address], [place], [place] [address]    Report Status PENDING  Incomplete  Blood Culture ID Panel (Reflexed)     Status: Abnormal   Collection Time: 09/21/18  8:13 AM  Result Value Ref Range Status   Enterococcus species NOT DETECTED NOT DETECTED Final   Listeria monocytogenes NOT DETECTED NOT DETECTED Final   Staphylococcus species NOT DETECTED NOT DETECTED Final   Staphylococcus aureus (BCID) NOT DETECTED NOT DETECTED Final   Streptococcus species NOT DETECTED NOT DETECTED Final   Streptococcus agalactiae NOT DETECTED NOT DETECTED Final   Streptococcus pneumoniae NOT DETECTED NOT DETECTED Final   Streptococcus pyogenes NOT DETECTED NOT DETECTED Final   Acinetobacter baumannii NOT DETECTED NOT DETECTED Final   Enterobacteriaceae species NOT DETECTED NOT DETECTED Final   Enterobacter cloacae complex NOT DETECTED NOT DETECTED Final   Escherichia coli NOT DETECTED NOT DETECTED Final   Klebsiella oxytoca NOT DETECTED NOT DETECTED Final   Klebsiella pneumoniae NOT DETECTED NOT DETECTED Final   Proteus species NOT DETECTED NOT DETECTED Final   Serratia marcescens NOT DETECTED NOT DETECTED Final   Carbapenem resistance NOT DETECTED NOT DETECTED Final   Haemophilus influenzae NOT DETECTED NOT DETECTED Final   Neisseria meningitidis NOT DETECTED NOT DETECTED Final   Pseudomonas aeruginosa DETECTED (A) NOT DETECTED Final    Comment: CRITICAL RESULT CALLED TO, READ BACK BY AND VERIFIED WITHLavell Luster [facility] 3833 09/22/18 A BROWNING    Candida albicans NOT DETECTED NOT DETECTED Final   Candida glabrata NOT DETECTED NOT DETECTED Final   Candida krusei NOT DETECTED NOT DETECTED Final   Candida parapsilosis NOT DETECTED NOT DETECTED Final   Candida tropicalis NOT DETECTED NOT DETECTED Final    Comment: Performed at [facility], [address], [place], [place] [address]  Culture, blood (routine x 2)     Status: None (Preliminary result)   Collection Time: 09/21/18  8:56 AM  Result Value Ref Range Status  Specimen Description   Final    BLOOD RIGHT HAND Performed at [facility] [address] [place], [place] [address]    Special Requests   Final    BOTTLES DRAWN AEROBIC AND ANAEROBIC Blood Culture results may not be optimal due to an inadequate volume of blood received in culture bottles Performed at [facility] [address], [place], [place] [address]    Culture   Final    NO GROWTH 1 DAY Performed at [facility], [place] [address] [place], [place] [address]    Report Status PENDING  Incomplete  Urine Culture     Status: Abnormal (Preliminary result)   Collection Time: 09/21/18  3:43 PM  Result Value Ref Range Status   Specimen Description   Final    URINE, CLEAN CATCH Performed at [facility], [place] [address], [place],  [address]    Special Requests   Final    Immunocompromised Performed at [facility], [place] [address] [place],  [address]    Culture 20,000 COLONIES/mL PSEUDOMONAS AERUGINOSA (A)  Final   Report Status PENDING  Incomplete    Radiology Reports Dg Chest 2 View  Result Date: 09/17/2018 CLINICAL DATA:  Cough. EXAM: CHEST - 2 VIEW COMPARISON:  Radiograph of September 14, 2018. FINDINGS: Stable cardiomediastinal silhouette. Large hiatal hernia is noted. No pneumothorax is noted. Right-sided pacemaker is unchanged in position. Mild bilateral pleural effusions are noted with associated atelectasis. Bony thorax is unremarkable. IMPRESSION: Stable large hiatal hernia. Mild bilateral pleural effusions are noted with associated atelectasis. Electronically Signed   By: Marijo Conception, M.D.   On: 09/17/2018 14:16   Dg Chest 2 View  Result Date: 09/07/2018 CLINICAL DATA:  Increasing  shortness of breath. Decreased oxygen saturation since yesterday. EXAM: CHEST - 2 VIEW COMPARISON:  Single-view of the chest and CT chest 08/29/2018. FINDINGS: There is cardiomegaly without edema. Moderate left pleural effusion and basilar atelectasis are seen. Pulmonary nodules in the left upper lobe and along the periphery of the left lung are identified as seen on prior CT. Small right pulmonary nodule seen on CT are difficult to visualize on this examination. Large hiatal hernia is noted. No pneumothorax. IMPRESSION: No change in a moderate left pleural effusion and left basilar atelectasis. Pulmonary nodules. Cardiomegaly without edema. Hiatal hernia. Electronically Signed   By: Inge Rise M.D.   On: 115-Apr-202019 09:02   Ct Head Wo Contrast  Result Date: 09/07/2018 CLINICAL DATA:  Sepsis. Recent diagnosis of suspected metastatic disease in chest abdomen and pelvis on CT studies. New left sided proptosis and left upper and lower extremity weakness. No reported injury. EXAM: CT HEAD WITHOUT CONTRAST TECHNIQUE: Contiguous axial images were obtained from the base of the skull through the vertex without intravenous contrast. COMPARISON:  07/15/2017 head CT. FINDINGS: Brain: No evidence of parenchymal hemorrhage or extra-axial fluid collection. No mass lesion, mass effect, or midline shift. No CT evidence of acute infarction. Nonspecific mild subcortical and periventricular white matter hypodensity, most in keeping with chronic small vessel ischemic change. Cerebral volume is age appropriate. No ventriculomegaly. Vascular: No acute abnormality. Skull: No evidence of calvarial fracture. Sinuses/Orbits: No fluid levels. Mild mucoperiosteal thickening in the maxillary sinuses bilaterally. Other:  The mastoid air cells are unopacified. IMPRESSION: 1.  No evidence of acute intracranial abnormality. 2. Mild chronic small vessel ischemic changes in the cerebral white matter. 3. Mild chronic appearing paranasal  sinusitis. Electronically Signed  By: Ilona Sorrel M.D.   On: 1September 28, 202019 12:06   Ct Angio Chest Pe W/cm &/or Wo Cm  Result Date: 09/07/2018 CLINICAL DATA:  Shortness of breath and weakness. 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 EXAM: CT ANGIOGRAPHY CHEST WITH CONTRAST TECHNIQUE: Multidetector CT imaging of the chest was performed using the standard protocol during bolus administration of intravenous contrast. Multiplanar CT image reconstructions and MIPs were obtained to evaluate the vascular anatomy. CONTRAST:  183m ISOVUE-370 IOPAMIDOL (ISOVUE-370) INJECTION 76% COMPARISON:  CTA of the chest on 08/29/2018 FINDINGS: Cardiovascular: The pulmonary arteries are adequately opacified. There is no evidence of pulmonary embolism. The thoracic aorta is normal in caliber and demonstrates no evidence of aneurysm or dissection. Proximal great vessels are normally patent. The heart size is stable and at the upper limits of normal. Pacemaker shows stable positioning. No pericardial fluid identified. Mediastinum/Nodes: Massive confluent lymphadenopathy of the left axilla extending into the left subpectoral region and encasing the region of the brachial plexus, left axillary vessels and left subclavian vessels appears progressively enlarged since the prior CTA. Index lateral subpectoral lymph node measured previously at 2.8 cm in short axis now measures approximately 4.1 cm. More inferior left axillary node measured at 2.5 cm in short axis previously now measures 3.1 cm. Confluent lymph node mass encasement of vessels does not cause arterial occlusion but may be causing significant mass effect on venous structures. The veins are not opacified on the study to determine whether any venous thrombus is present. Associated right hilar and mediastinal lymphadenopathy also appears slightly more prominent compared to the prior study. Largest area right hilar lymphadenopathy measures  approximately 2.2 cm. Stable large hiatal hernia. Lungs/Pleura: There also is progressive pulmonary involvement with lymphoma with definite progression since the prior CTA. All of the multiple bilateral pulmonary nodules show enlargement since the prior study. Index mass in the subpleural left lower lobe now measures approximately 2.2 x 3.7 cm (previously 1.7 x 3.3 cm). All other lesions also show enlargement. New small right pleural effusion. Small to moderate left pleural effusion appears stable since the prior study. Upper Abdomen: Stable splenomegaly. Musculoskeletal: No chest wall abnormality. No acute or significant osseous findings. Review of the MIP images confirms the above findings. IMPRESSION: 1. No evidence of pulmonary embolism. 2. Progression of lymphoma since the prior CTA on 11/25 with enlargement of left axillary, subpectoral and chest wall lymph node masses with encasement of left subclavian and axillary vascular structures. Enlargement of right hilar and mediastinal lymph nodes since the prior study. Pulmonary involvement also shows significant progression with enlargement of all of the bilateral pulmonary nodule seen previously. 3. Normal arterial patency demonstrated of the left subclavian and axillary arteries. Venous patency cannot be determined on the CTA study and the patient would be at high risk of left upper extremity DVT given the large bulky lymphadenopathy present. 4. New small right pleural effusion. Stable small to moderate left pleural effusion. 5. Stable splenomegaly. Electronically Signed   By: GAletta EdouardM.D.   On: 1September 28, 202019 12:29   Ct Angio Chest Pe W Or Wo Contrast  Result Date: 08/29/2018 CLINICAL DATA:  Severe left arm swelling since yesterday. Hemoglobin L5. EXAM: CT ANGIOGRAPHY CHEST WITH CONTRAST TECHNIQUE: Multidetector CT imaging of the chest was performed using the standard protocol during bolus administration of intravenous contrast. Multiplanar CT image  reconstructions and MIPs were obtained to evaluate the vascular anatomy. CONTRAST:  100 cc ISOVUE-370 IOPAMIDOL (ISOVUE-370) INJECTION 76% COMPARISON:  CXR 08/29/2018 FINDINGS: Cardiovascular: Conventional branch pattern of the great vessels with minimal atherosclerosis at the origins. No significant stenosis. Patent left subclavian artery traversing an area of ill-defined soft tissue edema and enlargement as described below. Mild aortic atherosclerosis without aneurysm or dissection. Satisfactory opacification of the pulmonary arteries to the proximal segmental level without large central pulmonary embolus. Mediastinum/Nodes: Left axillary nodal enlargement is identified ranging size from 0.3 cm to 2.8 cm short axis. Mildly enlarged right-sided axillary lymph nodes up to 1.3 cm short axis are identified. Large hiatal hernia with air-fluid level noted within. 1.4 cm short axis mass abutting the left lateral aspect of the aortic arch in the prevascular space is noted more likely to represent a pulmonary lesion as opposed lymphadenopathy. Nonpathologic sized mediastinal and hilar lymph nodes are noted. Lungs/Pleura: Masslike opacities are identified in the lingula, the largest abutting the pleural surface measuring 3.3 x 1.7 cm on series 5/92 with previously mentioned paramediastinal mass in the left upper lobe measuring 2.3 x 1.4 cm, series 5/49. Superior segment left lower lobe mass measuring 1.8 x 1.3 cm, series 5/52 is also noted. There are smaller subcentimeter nodules within both upper lobes, right middle lobe and right lower lobe with largest masslike opacities in the right lower lobe measuring 1.1 x 1 cm, series 5/89 and abutting the right lateral margin of the hiatal hernia measuring 2.5 x 1.9 cm, series 5/105. Moderate left effusion with compressive atelectasis is identified with trace right pleural effusion. No pneumothorax. Upper Abdomen: No space-occupying mass of the included liver. The included right  adrenal gland is unremarkable. Nodular soft tissue densities in the left upper quadrant are felt to represent partial imaging of small bowel less likely mesenteric adenopathy. The included spleen is top-normal in size. Musculoskeletal: Ill-defined asymmetric soft tissue enlargement in the region of the left pectoralis muscle suspicious for intramuscular hemorrhage and edema given reported rapid onset of left arm swelling and clinical report of low hemoglobin. Asymmetric subcutaneous edema of the partially included left breast with thickening of the skin concerning for possible possible inflammatory carcinoma of the breast. Right-sided pacemaker apparatus is noted with leads in the right atrium right ventricle. Thoracic spondylosis is noted. Review of the MIP images confirms the above findings. IMPRESSION: 1. No acute large central pulmonary embolus, aortic aneurysm or dissection. 2. Multilobar masslike opacities scattered throughout both lungs as above described, the largest in the lingula measuring up to 3.3 cm, presumed metastatic given left axillary adenopathy also noted. 3. Left axillary nodal enlargement measuring up to 2.8 cm short axis. 4. Asymmetric soft tissue enlargement of the left pectoralis muscle suspicious for intramuscular hemorrhage and edema given reported rapid onset of left arm swelling and clinical report of low hemoglobin. An infiltrating mass or metastasis is also a differential considerations given local adenopathy, asymmetric subcutaneous edema of the partially included left breast with thickening of the skin concerning for possible inflammatory carcinoma of the breast. Clinical correlation is therefore recommended as well as mammographic correlation. 5. Moderate left and trace right pleural effusions with compressive atelectasis. These results were called by telephone at the time of interpretation on 08/29/2018 at 3:43 pm to Dr. Karmen Bongo , who verbally acknowledged these results.  Aortic Atherosclerosis (ICD10-I70.0). Electronically Signed   By: Ashley Royalty M.D.   On: 08/29/2018 15:43   Ct Abdomen Pelvis W Contrast  Result Date: 08/31/2018 CLINICAL DATA:  Pulmonary nodules on previous chest CT suspicious for metastatic disease. EXAM: CT ABDOMEN AND PELVIS WITH CONTRAST  TECHNIQUE: Multidetector CT imaging of the abdomen and pelvis was performed using the standard protocol following bolus administration of intravenous contrast. CONTRAST:  172m OMNIPAQUE IOHEXOL 300 MG/ML  SOLN COMPARISON:  Chest CT 08/29/2018 FINDINGS: Lower chest: Pacer wires noted in the heart. Large hiatal hernia. Bilateral pleural effusions, left greater than right. Pulmonary nodules noted in both lower lungs. Hepatobiliary: No focal abnormality within the liver parenchyma. Liver measures 20.1 cm craniocaudal length. Dependent sludge or stones noted in the gallbladder. No intrahepatic or extrahepatic biliary dilation. Pancreas: No focal mass lesion. No dilatation of the main duct. No intraparenchymal cyst. No peripancreatic edema. Spleen: Spleen measures 13.7 cm in craniocaudal length, upper normal to mildly increased. Adrenals/Urinary Tract: No adrenal nodule or mass. Kidneys unremarkable. Central sinus cysts noted left kidney. No evidence for hydroureter. The urinary bladder appears normal for the degree of distention. Stomach/Bowel: Large hiatal hernia. Duodenum is normally positioned as is the ligament of Treitz. Duodenal diverticulum noted. No small bowel wall thickening. No small bowel dilatation. The terminal ileum is normal. The appendix is not visualized, but there is no edema or inflammation in the region of the cecum. No gross colonic mass. No colonic wall thickening. Diverticular changes are noted in the left colon without evidence of diverticulitis. Vascular/Lymphatic: There is abdominal aortic atherosclerosis without aneurysm. Mild lymphadenopathy noted hepato duodenal ligament with 13 mm short axis  index node visible on 21/3. Borderline para-aortic retroperitoneal lymphadenopathy evident. 10 mm short axis left para-aortic lymph node visible on 31/3. relatively bulky lymphadenopathy is seen along both pelvic sidewalls. 2.2 cm short axis left external iliac node is visible on 76/3. 2.8 cm short axis right external iliac lymph node associated with another 2.9 cm short axis node. 2.2 cm short axis lymph node identified in the right groin. Reproductive: Uterus unremarkable.  There is no adnexal mass. Other: No intraperitoneal free fluid. Musculoskeletal: Extensive body wall edema noted left lower hemithorax and left upper abdominal wall. Degenerative changes noted left hip. IMPRESSION: 1. Bulky pelvic sidewall lymphadenopathy with borderline to mild lymphadenopathy in the hepato duodenal ligament and para-aortic retroperitoneal space. Lymphoma or metastatic disease would be primary considerations. 2. Hepatomegaly with borderline splenomegaly. Prominent portal and splenic veins raise the question of portal venous hypertension. 3. Gallbladder sludge or tiny stones. 4. Left chest wall and upper abdominal wall edema. 5. Large hiatal hernia with bilateral pleural effusions, similar to prior. 6.  Aortic Atherosclerois (ICD10-170.0) Electronically Signed   By: EMisty StanleyM.D.   On: 08/31/2018 13:13   Dg Chest Port 1 View  Result Date: 09/21/2018 CLINICAL DATA:  Tachypnea. EXAM: PORTABLE CHEST 1 VIEW COMPARISON:  Chest radiographs 2 days prior, chest CT 103/20/202019 FINDINGS: Right-sided pacemaker in place. Right upper extremity PICC in place, tip in the proximal SVC. The patient is significantly rotated. Bilateral pleural effusions and associated airspace disease, likely progressed at the left lung base. Nodular density in the periphery of the left lung again seen. Large retrocardiac hiatal hernia. Unchanged heart size and mediastinal contours. No pulmonary edema. IMPRESSION: Bilateral pleural effusions, increased  on the left. Unchanged radiographic appearance of large hiatal hernia. Peripheral nodular opacity as seen on prior chest CT. Additional pulmonary nodules on CT are not visualized radiographically Electronically Signed   By: MKeith RakeM.D.   On: 09/21/2018 03:42   Dg Chest Port 1 View  Addendum Date: 09/14/2018   ADDENDUM REPORT: 09/14/2018 09:14 ADDENDUM: The impression should read: Stable chest x-ray demonstrating pulmonary edema and bilateral pleural effusions. Electronically  Signed   By: Aletta Edouard M.D.   On: 09/14/2018 09:14   Result Date: 09/14/2018 CLINICAL DATA:  Shortness of breath. History of progressive high-grade B-cell lymphoma. EXAM: PORTABLE CHEST 1 VIEW COMPARISON:  Chest x-ray on 09/09/2018 and CT of the chest on 12020-12-2817. FINDINGS: Stable heart size, appearance of pacemaker and large hiatal hernia. Stable probable bilateral pleural effusions and bilateral pulmonary nodules. No overt pulmonary edema. No pneumothorax. IMPRESSION: Stable chest x-ray demonstrating pulmonary lymphoma and bilateral pleural effusions. Electronically Signed: By: Aletta Edouard M.D. On: 09/11/2018 14:45   Dg Chest Port 1 View  Result Date: 09/14/2018 CLINICAL DATA:  Shortness of breath. EXAM: PORTABLE CHEST 1 VIEW COMPARISON:  Radiographs 09/11/2018 and 09/09/2018. FINDINGS: 0523 hours. Mild patient rotation to the right. The right arm PICC and right subclavian pacemaker leads appear unchanged. The heart size and mediastinal contours are stable. There is a large hiatal hernia. There are persistent bilateral pleural effusions which appear mildly improved on the right. There is no pneumothorax or confluent airspace opacity. IMPRESSION: No acute findings demonstrated. The right pleural effusion appears slightly smaller. Electronically Signed   By: Richardean Sale M.D.   On: 09/14/2018 09:10   Dg Chest Port 1 View  Result Date: 09/09/2018 CLINICAL DATA:  Hypoxia, history of progressive lymphoma  EXAM: PORTABLE CHEST 1 VIEW COMPARISON:  12020-12-2817 FINDINGS: Cardiac shadow is enlarged. Pacing device is again seen. Bilateral pleural effusions are again noted. The nodular changes seen on recent chest CT are not as well appreciated on today's exam. No pneumothorax is noted. IMPRESSION: Bilateral effusions. Patchy densities are noted consistent findings of recent CT although less well appreciated on today's exam. Electronically Signed   By: Inez Catalina M.D.   On: 09/09/2018 06:35   Dg Chest Portable 1 View  Result Date: 08/29/2018 CLINICAL DATA:  72 year old female with a history of shortness of breath and edema EXAM: PORTABLE CHEST 1 VIEW COMPARISON:  04/19/2018, 04/18/2018 FINDINGS: Cardiomediastinal silhouette unchanged in size and contour. Double density projects over the lower mediastinum is unchanged. On the frontal view there is a Passy at the left base obscuring the left hemidiaphragm with blunting of left costophrenic angle, increased from the prior. No pneumothorax. Unchanged right chest wall cardiac pacing device with 2 leads in place. No confluent airspace disease on the right. IMPRESSION: New opacity at the left lung base obscuring left hemidiaphragm in the left heart border, compatible with pleural effusion and associated atelectasis/consolidation. Hiatal hernia. Electronically Signed   By: Corrie Mckusick D.O.   On: 08/29/2018 09:09   Dg Abd Portable 1v  Result Date: 09/13/2018 CLINICAL DATA:  Constipation EXAM: PORTABLE ABDOMEN - 1 VIEW COMPARISON:  CT 08/31/2018 FINDINGS: Nonobstructed bowel gas pattern. Residual contrast within the colon. Mild to moderate feces retention at the rectum. IMPRESSION: Nonobstructed gas pattern with mild to moderate fecal retention at the rectum Electronically Signed   By: Donavan Foil M.D.   On: 09/13/2018 23:51   Dg Swallowing Func-speech Pathology  Result Date: 09/15/2018 Objective Swallowing Evaluation: Type of Study: MBS-Modified Barium Swallow  Study  Patient Details Name: Patricia Murillo MRN: [id number] Date of Birth: Jan 11, 1946 Today's Date: 09/15/2018 Time: SLP Start Time (ACUTE ONLY): 1210 -SLP Stop Time (ACUTE ONLY): 1240 SLP Time Calculation (min) (ACUTE ONLY): 30 min Past Medical History: Past Medical History: Diagnosis Date . Arthritis  . Hiatal hernia  . History of chicken pox  . Hyperlipidemia  . Pacemaker  Past Surgical History: Past Surgical History: Procedure Laterality  Date . ANKLE SURGERY   . PACEMAKER IMPLANT N/A 04/18/2018  Procedure: PACEMAKER IMPLANT;  Surgeon: Evans Lance, MD;  Location: [facility] [facility];  Service: Cardiovascular;  Laterality: N/A; HPI: Patricia Murillo is a 72 y.o. 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  During her hospitalization she underwent left axillary lymph node biopsy.  She was discharged home in a stable condition, but apparently she has been developing worsening weakness, generalized, worse with exertion, no improving factors, associated with malaise, chills and poor appetite.  Over the last 24 hours her symptoms have been more severe to the point where she has difficulty standing. She has persistent left upper extremity edema which has been painful, sharp in nature up to 10 out of 10 intensity.  Her weakness has led to dyspnea which has been severe in intensity, and triggered her emergency room visit today.Pt was made NPO yesterday due to concerns for aspiration.   Today pt is alert, has congested weak cough concerrning for airway protection.   Pt underwent MBS yesterday and was placed on full liquid diet with precautions. RN reports pt continues to cough with po intake.  Per note from MD, now plan is for pt to have small bore feeding tube placed for nutritional support.  Unfortunately pt did not tolerate attempt at insertion of feeding tube.  Repeat swallow  evaluation ordered.  SLP follow up to inform family/pt of plan and determine if clinically pt appears improved.    Subjective: Alert Assessment / Plan / Recommendation CHL IP CLINICAL IMPRESSIONS 09/15/2018 Clinical Impression Patient had a MBS on 09/12/18 and patient presented with Moderately severe oropharyngeal dysphagia with sensorimotor deficits. She presents much stronger today and presents with a mild oropharyngeal dysphagia. Oral preparation and bolus transfer was mild evidenced by trace oral residue. Patient initiated swallow at the level of the pyriform sinuses with no penetration or aspiration with cup sips of all sizes. She did have penetration with cup sip with straw, but spontaneously cleared layngeal vestibule. She initiated swallow at the valleculae with puree and cracker bolus, no penetration or aspiration. Significant pharyngeal residue present after 1st swallow, but improved mostly cleared with second spontaneous swallow. Upper 1/3 of esphagus observed to clear well with puree and tablet. Due to significant sensorimotor improvement, recommend patient have regular diet with thin liquids, medication whole in puree. Speech therapy to follow up for continue diet tolerance and swallowing therapy. SLP Visit Diagnosis Dysphagia, oropharyngeal phase (R13.12) Attention and concentration deficit following -- Frontal lobe and executive function deficit following -- Impact on safety and function Mild aspiration risk   CHL IP TREATMENT RECOMMENDATION 09/15/2018 Treatment Recommendations Therapy as outlined in treatment plan below   Prognosis 09/15/2018 Prognosis for Safe Diet Advancement Good Barriers to Reach Goals -- Barriers/Prognosis Comment -- CHL IP DIET RECOMMENDATION 09/15/2018 SLP Diet Recommendations Regular solids;Thin liquid Liquid Administration via Cup;No straw Medication Administration Whole meds with liquid Compensations Slow rate;Small sips/bites;Other (Comment) Postural Changes Remain  semi-upright after after feeds/meals (Comment);Seated upright at 90 degrees   CHL IP OTHER RECOMMENDATIONS 09/15/2018 Recommended Consults -- Oral Care Recommendations Oral care before and after PO Other Recommendations --   CHL IP FOLLOW UP RECOMMENDATIONS 09/14/2018 Follow up Recommendations (No Data)   CHL IP FREQUENCY AND DURATION 09/15/2018 Speech Therapy Frequency (ACUTE ONLY) min 2x/week Treatment Duration 1 week      CHL  IP ORAL PHASE 09/15/2018 Oral Phase Impaired Oral - Pudding Teaspoon -- Oral - Pudding Cup Lingual/palatal residue Oral - Honey Teaspoon -- Oral - Honey Cup -- Oral - Nectar Teaspoon NT Oral - Nectar Cup NT Oral - Nectar Straw NT Oral - Thin Teaspoon Lingual/palatal residue Oral - Thin Cup Lingual/palatal residue;Impaired mastication Oral - Thin Straw Lingual/palatal residue Oral - Puree Lingual/palatal residue Oral - Mech Soft Lingual/palatal residue Oral - Regular -- Oral - Multi-Consistency -- Oral - Pill -- Oral Phase - Comment --  CHL IP PHARYNGEAL PHASE 09/15/2018 Pharyngeal Phase Impaired Pharyngeal- Pudding Teaspoon Delayed swallow initiation-vallecula;Pharyngeal residue - valleculae;Pharyngeal residue - pyriform Pharyngeal -- Pharyngeal- Pudding Cup NT Pharyngeal -- Pharyngeal- Honey Teaspoon -- Pharyngeal -- Pharyngeal- Honey Cup -- Pharyngeal -- Pharyngeal- Nectar Teaspoon NT Pharyngeal -- Pharyngeal- Nectar Cup NT Pharyngeal -- Pharyngeal- Nectar Straw NT Pharyngeal -- Pharyngeal- Thin Teaspoon Delayed swallow initiation-pyriform sinuses;Reduced pharyngeal peristalsis;Reduced anterior laryngeal mobility;Reduced laryngeal elevation;Reduced airway/laryngeal closure;Reduced tongue base retraction;Pharyngeal residue - valleculae;Pharyngeal residue - pyriform Pharyngeal -- Pharyngeal- Thin Cup Delayed swallow initiation-pyriform sinuses;Reduced anterior laryngeal mobility;Reduced laryngeal elevation;Reduced airway/laryngeal closure;Reduced tongue base retraction;Pharyngeal residue -  valleculae;Pharyngeal residue - pyriform;Reduced pharyngeal peristalsis Pharyngeal -- Pharyngeal- Thin Straw Delayed swallow initiation-pyriform sinuses;Reduced anterior laryngeal mobility;Reduced laryngeal elevation;Reduced airway/laryngeal closure;Reduced pharyngeal peristalsis;Reduced tongue base retraction;Pharyngeal residue - valleculae;Pharyngeal residue - pyriform;Penetration/Aspiration during swallow Pharyngeal -- Pharyngeal- Puree Delayed swallow initiation-vallecula;Reduced laryngeal elevation;Reduced airway/laryngeal closure;Reduced tongue base retraction;Pharyngeal residue - valleculae Pharyngeal -- Pharyngeal- Mechanical Soft Delayed swallow initiation-vallecula;Reduced laryngeal elevation;Reduced airway/laryngeal closure;Reduced tongue base retraction Pharyngeal -- Pharyngeal- Regular NT Pharyngeal -- Pharyngeal- Multi-consistency -- Pharyngeal -- Pharyngeal- Pill Delayed swallow initiation-vallecula;Reduced laryngeal elevation;Reduced airway/laryngeal closure Pharyngeal -- Pharyngeal Comment --  CHL IP CERVICAL ESOPHAGEAL PHASE 09/15/2018 Cervical Esophageal Phase WFL Pudding Teaspoon -- Pudding Cup -- Honey Teaspoon -- Honey Cup -- Nectar Teaspoon -- Nectar Cup -- Nectar Straw -- Thin Teaspoon -- Thin Cup -- Thin Straw -- Puree -- Mechanical Soft -- Regular -- Multi-consistency -- Pill -- Cervical Esophageal Comment -- Charlynne Cousins Ward, MA, CCC-SLP 09/15/2018 2:59 PM              Dg Swallowing Func-speech Pathology  Result Date: 09/12/2018 Objective Swallowing Evaluation: Type of Study: MBS-Modified Barium Swallow Study  Patient Details Name: Patricia Murillo MRN: [id number] Date of Birth: 11-05-1945 Today's Date: 09/12/2018 Time: SLP Start Time (ACUTE ONLY): 1300 -SLP Stop Time (ACUTE ONLY): 1337 SLP Time Calculation (min) (ACUTE ONLY): 37 min Past Medical History: Past Medical History: Diagnosis Date . Arthritis  . Hiatal hernia  . History of chicken pox  . Hyperlipidemia  . Pacemaker  Past  Surgical History: Past Surgical History: Procedure Laterality Date . ANKLE SURGERY   . PACEMAKER IMPLANT N/A 04/18/2018  Procedure: PACEMAKER IMPLANT;  Surgeon: Evans Lance, MD;  Location: [place] [facility];  Service: Cardiovascular;  Laterality: N/A; HPI: Patricia Murillo is a 72 y.o. 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  During her hospitalization she underwent left axillary lymph node biopsy.  She was discharged home in a stable condition, but apparently she has been developing worsening weakness, generalized, worse with exertion, no improving factors, associated with malaise, chills and poor appetite.  Over the last 24 hours her symptoms have been more severe to the point where she has difficulty standing. She has persistent left upper extremity edema which has been painful, sharp  in nature up to 10 out of 10 intensity.  Her weakness has led to dyspnea which has been severe in intensity, and triggered her emergency room visit today.Pt was made NPO yesterday due to concerns for aspiration.   Today pt is alert, has congested weak cough concerrning for airway protection.  Subjective: The patient was seen sitting upright in bed with her daughter at the bedside.  Assessment / Plan / Recommendation CHL IP CLINICAL IMPRESSIONS 09/12/2018 Clinical Impression Pt presents with moderately severe oropharyngeal dysphagia with sensorimotor deficits.  Decreased oral propulsion and weak pharyngeal motility results in significant pharyngeal more than oral residuals.  Pt does NOT sense residuals and is unable to "hock" to expectorate to clear them.  Chin tuck, head turn postures did NOT decrease residuals.  Pt did aspirate with thin liquids posterior trachea with delayed cough response.  Although she did not aspirate with thicker consistencies, increased residuals present which if  aspiration post-swallow are more difficult for pulmonary clearance.  Pt is grossly weak at this time - uncertain of her baseline swallow function.  Pt is a high aspiration/malnutrition risk due to level of dysphagia and weak nonproductive cough.  If she is to consume po intake= it should be with accepted risks and mitigation strategies.  Question if pt's swallow with improve with improved medical status. SLP Visit Diagnosis Dysphagia, oropharyngeal phase (R13.12) Attention and concentration deficit following -- Frontal lobe and executive function deficit following -- Impact on safety and function Severe aspiration risk;Risk for inadequate nutrition/hydration   CHL IP TREATMENT RECOMMENDATION 09/11/2018 Treatment Recommendations Therapy as outlined in treatment plan below   Prognosis 09/12/2018 Prognosis for Safe Diet Advancement Fair Barriers to Reach Goals Severity of deficits;Cognitive deficits;Other (Comment) Barriers/Prognosis Comment -- CHL IP DIET RECOMMENDATION 09/12/2018 SLP Diet Recommendations (No Data) Liquid Administration via Cup Medication Administration Crushed with puree Compensations Slow rate;Small sips/bites;Follow solids with liquid Postural Changes Remain semi-upright after after feeds/meals (Comment);Seated upright at 90 degrees   CHL IP OTHER RECOMMENDATIONS 09/12/2018 Recommended Consults -- Oral Care Recommendations Oral care before and after PO Other Recommendations Have oral suction available   CHL IP FOLLOW UP RECOMMENDATIONS 09/12/2018 Follow up Recommendations (No Data)   CHL IP FREQUENCY AND DURATION 09/12/2018 Speech Therapy Frequency (ACUTE ONLY) min 2x/week Treatment Duration 1 week      CHL IP ORAL PHASE 09/12/2018 Oral Phase Impaired Oral - Pudding Teaspoon -- Oral - Pudding Cup -- Oral - Honey Teaspoon -- Oral - Honey Cup -- Oral - Nectar Teaspoon Weak lingual manipulation;Reduced posterior propulsion;Lingual/palatal residue Oral - Nectar Cup Weak lingual manipulation;Reduced posterior  propulsion;Lingual/palatal residue Oral - Nectar Straw Weak lingual manipulation;Reduced posterior propulsion;Lingual/palatal residue Oral - Thin Teaspoon Weak lingual manipulation;Reduced posterior propulsion Oral - Thin Cup Weak lingual manipulation;Reduced posterior propulsion Oral - Thin Straw Reduced posterior propulsion Oral - Puree Weak lingual manipulation;Reduced posterior propulsion Oral - Mech Soft Weak lingual manipulation;Reduced posterior propulsion Oral - Regular -- Oral - Multi-Consistency -- Oral - Pill -- Oral Phase - Comment --  CHL IP PHARYNGEAL PHASE 09/12/2018 Pharyngeal Phase Impaired Pharyngeal- Pudding Teaspoon -- Pharyngeal -- Pharyngeal- Pudding Cup -- Pharyngeal -- Pharyngeal- Honey Teaspoon -- Pharyngeal -- Pharyngeal- Honey Cup -- Pharyngeal -- Pharyngeal- Nectar Teaspoon Reduced pharyngeal peristalsis;Reduced epiglottic inversion;Reduced tongue base retraction;Pharyngeal residue - valleculae;Pharyngeal residue - pyriform Pharyngeal -- Pharyngeal- Nectar Cup Reduced pharyngeal peristalsis;Reduced epiglottic inversion;Reduced laryngeal elevation;Reduced airway/laryngeal closure;Reduced tongue base retraction;Pharyngeal residue - valleculae;Pharyngeal residue - pyriform;Penetration/Aspiration during swallow Pharyngeal Material enters airway, remains ABOVE vocal cords and  not ejected out Pharyngeal- Nectar Straw Reduced pharyngeal peristalsis;Reduced epiglottic inversion;Reduced anterior laryngeal mobility;Reduced airway/laryngeal closure;Reduced tongue base retraction;Reduced laryngeal elevation;Pharyngeal residue - valleculae;Pharyngeal residue - pyriform Pharyngeal -- Pharyngeal- Thin Teaspoon Reduced pharyngeal peristalsis;Reduced epiglottic inversion;Reduced laryngeal elevation;Reduced airway/laryngeal closure;Reduced tongue base retraction;Pharyngeal residue - valleculae;Pharyngeal residue - pyriform Pharyngeal -- Pharyngeal- Thin Cup Reduced pharyngeal peristalsis;Reduced epiglottic  inversion;Reduced laryngeal elevation;Reduced airway/laryngeal closure;Reduced tongue base retraction;Pharyngeal residue - valleculae;Pharyngeal residue - pyriform;Penetration/Aspiration during swallow Pharyngeal Material enters airway, passes BELOW cords without attempt by patient to eject out (silent aspiration) Pharyngeal- Thin Straw Reduced pharyngeal peristalsis;Reduced epiglottic inversion;Reduced laryngeal elevation;Reduced airway/laryngeal closure;Reduced tongue base retraction;Pharyngeal residue - valleculae;Pharyngeal residue - pyriform Pharyngeal Material enters airway, passes BELOW cords and not ejected out despite cough attempt by patient Pharyngeal- Puree Reduced epiglottic inversion;Reduced pharyngeal peristalsis;Reduced tongue base retraction;Reduced airway/laryngeal closure;Reduced laryngeal elevation;Pharyngeal residue - valleculae;Pharyngeal residue - pyriform Pharyngeal -- Pharyngeal- Mechanical Soft Reduced pharyngeal peristalsis;Reduced epiglottic inversion;Reduced tongue base retraction;Pharyngeal residue - valleculae;Reduced airway/laryngeal closure Pharyngeal -- Pharyngeal- Regular -- Pharyngeal -- Pharyngeal- Multi-consistency -- Pharyngeal -- Pharyngeal- Pill -- Pharyngeal -- Pharyngeal Comment --  CHL IP CERVICAL ESOPHAGEAL PHASE 09/12/2018 Cervical Esophageal Phase Impaired Pudding Teaspoon -- Pudding Cup -- Honey Teaspoon -- Honey Cup -- Nectar Teaspoon -- Nectar Cup -- Nectar Straw -- Thin Teaspoon -- Thin Cup -- Thin Straw -- Puree -- Mechanical Soft -- Regular -- Multi-consistency -- Pill -- Cervical Esophageal Comment upon esophageal sweep, pt appeared clear Luanna Salk, MS [facility] SLP Acute Rehab Services Pager [phone] Office [phone] Macario Golds 09/12/2018, 5:29 PM              [place] Core Biopsy (lymph Nodes)  Result Date: 08/30/2018 INDICATION: 72 year old with scattered lung lesions, left arm swelling and extensive left axillary lymphadenopathy. Findings are  concerning for metastatic disease and tissue diagnosis is needed. EXAM: ULTRASOUND-GUIDED CORE BIOPSY OF LEFT AXILLARY LYMPH NODE MEDICATIONS: None. ANESTHESIA/SEDATION: Moderate (conscious) sedation was employed during this procedure. A total of Versed 0.5 mg and Fentanyl 50 mcg was administered intravenously. Moderate Sedation Time: 15 minutes. The patient's level of consciousness and vital signs were monitored continuously by radiology nursing throughout the procedure under my direct supervision. FLUOROSCOPY TIME:  None COMPLICATIONS: None immediate. PROCEDURE: Informed written consent was obtained from the patient after a thorough discussion of the procedural risks, benefits and alternatives. All questions were addressed. A timeout was performed prior to the initiation of the procedure. Ultrasound demonstrated enlarged left axillary lymph nodes. The left axilla was prepped and draped in sterile fashion. Skin was anesthetized with 1% lidocaine. 18 gauge core biopsies were obtained of a round enlarged lymph node with ultrasound guidance. Total of 6 core biopsies were obtained. Specimens placed in saline. Bandage placed over the puncture site. FINDINGS: Multiple enlarged hypoechoic lymph nodes in left axilla. Biopsy needle confirmed within the targeted lymph node. No bleeding or hematoma formation at end of procedure. IMPRESSION: Ultrasound-guided core biopsy of an enlarged left axillary lymph node. Electronically Signed   By: Markus Daft M.D.   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 Limitations: Body habitus, swelling and poor ultrasound/tissue interface. Comparison Study: No prior study on file for comparison. Performing Technologist: Sharion Dove RVS  Examination Guidelines: A complete evaluation includes B-mode imaging, spectral Doppler, color  Doppler, and power Doppler as needed of all accessible portions of each vessel.  Bilateral testing is considered an integral part of a complete examination. Limited examinations for reoccurring indications may be performed as noted.  Right Findings: +----------+------------+----------+---------+-----------+-------+ RIGHT     CompressiblePropertiesPhasicitySpontaneousSummary +----------+------------+----------+---------+-----------+-------+ Subclavian                         Yes       Yes            +----------+------------+----------+---------+-----------+-------+  Left Findings: +----------+------------+----------+---------+-----------+-------+ LEFT      CompressiblePropertiesPhasicitySpontaneousSummary +----------+------------+----------+---------+-----------+-------+ IJV                                Yes       Yes            +----------+------------+----------+---------+-----------+-------+ Subclavian                         Yes       Yes            +----------+------------+----------+---------+-----------+-------+ Axillary      Full                                          +----------+------------+----------+---------+-----------+-------+ Brachial      Full                                          +----------+------------+----------+---------+-----------+-------+ Cephalic      Full                                          +----------+------------+----------+---------+-----------+-------+ Basilic       Full                                          +----------+------------+----------+---------+-----------+-------+  Summary:  Right: No evidence of thrombosis in the subclavian.  Left: No evidence of deep vein thrombosis in the upper extremity. However, unable to visualize the radial and ulnar. No evidence of superficial vein thrombosis in the upper extremity. However, unable to visualize the radial and ulnar. No evidence of thrombosis in the . However,  unable to visualize the radial and ulnar. This was a limited study.  *See table(s) above for measurements and observations.  Diagnosing physician: Deitra Mayo MD Electronically signed by Deitra Mayo MD on 08/29/2018 at 4:20:56 PM.    Final    Ir Picc Placement Right >5 Yrs Inc Img Guide  Result Date: 09/09/2018 INDICATION: Acute lymphoma, access for chemotherapy as an inpatient EXAM: ULTRASOUND AND FLUOROSCOPIC GUIDED PICC LINE INSERTION MEDICATIONS: 1% lidocaine local CONTRAST:  None FLUOROSCOPY TIME:  Twenty-four seconds (8 mGy) COMPLICATIONS: None immediate. TECHNIQUE: The procedure, risks, benefits, and alternatives were explained to the patient and informed written consent was obtained. A timeout was performed prior to the initiation of the procedure. The right upper extremity was prepped with chlorhexidine in a sterile fashion, and a sterile drape was applied covering the operative field. Maximum barrier sterile technique with sterile gowns and gloves were used for the procedure. A timeout was  performed prior to the initiation of the procedure. Local anesthesia was provided with 1% lidocaine. Under direct ultrasound guidance, the right brachial vein was accessed with a micropuncture kit after the overlying soft tissues were anesthetized with 1% lidocaine. An ultrasound image was saved for documentation purposes. A guidewire was advanced to the level of the superior caval-atrial junction for measurement purposes and the PICC line was cut to length. A peel-away sheath was placed and a 36 cm, 5 [place], dual lumen was inserted to level of the superior caval-atrial junction. A post procedure spot fluoroscopic was obtained. The catheter easily aspirated and flushed and was sutured in place. A dressing was placed. The patient tolerated the procedure well without immediate post procedural complication. FINDINGS: After catheter placement, the tip lies within the superior cavoatrial junction. The  catheter aspirates and flushes normally and is ready for immediate use. IMPRESSION: Successful ultrasound and fluoroscopic guided placement of a right brachial vein approach, 36 cm, 5 French, dual lumen PICC with tip at the superior caval-atrial junction. The PICC line is ready for immediate use. Electronically Signed   By: Jerilynn Mages.  Shick M.D.   On: 09/09/2018 09:23   [place] Ekg Site Rite  Result Date: 09/08/2018 If Site Rite image not attached, placement could not be confirmed due to current cardiac rhythm.    CBC Recent Labs  Lab 09/16/18 0500 09/17/18 0600 09/18/18 0422 09/19/18 0401 09/20/18 0450 09/21/18 0607 09/22/18 0546  WBC 2.8* 1.6* 0.6* 0.1* 0.1* 0.4* 2.2*  HGB 6.7* 8.7* 8.9* 8.8* 8.2* 8.0* 7.0*  HCT 21.3* 26.7* 26.8* 26.7* 25.2* 24.5* 22.1*  PLT 44* 54* 59* 60* 64* 111* 129*  MCV 91.0 88.1 86.5 87.5 88.7 87.5 89.5  MCH 28.6 28.7 28.7 28.9 28.9 28.6 28.3  MCHC 31.5 32.6 33.2 33.0 32.5 32.7 31.7  RDW 17.2* 16.3* 16.1* 15.9* 15.9* 16.1* 15.9*  LYMPHSABS 0.0* 0.0* 0.0*  --   --  0.0* 0.0*  MONOABS 0.0* 0.0* 0.0*  --   --  0.2 0.5  EOSABS 0.0 0.0 0.0  --   --  0.0 0.0  BASOSABS 0.0 0.0 0.0  --   --  0.0 0.0    Chemistries  Recent Labs  Lab 09/16/18 0500  09/18/18 0422 09/19/18 0401 09/20/18 0450 09/21/18 0607 09/22/18 0546  NA 141   < > 141 140 135 133* 137  K 3.9   < > 3.1* 3.6 3.8 3.5 3.1*  CL 108   < > 99 98 98 94* 106  CO2 28   < > 34* 35* 29 29 27   GLUCOSE 94   < > 105* 110* 110* 96 89  BUN 32*   < > 17 15 13 13 16   CREATININE 0.44   < > 0.38* 0.40* 0.36* 0.46 0.41*  CALCIUM 9.2   < > 8.9 8.7* 8.4* 7.7* 7.9*  MG  --   --  1.8 2.1  --   --   --   AST 23  --   --  23 16  --  9*  ALT 14  --   --  20 18  --  14  ALKPHOS 43  --   --  61 61  --  57  BILITOT 1.2  --   --  2.1* 2.1*  --  1.9*   < > = values in this interval not displayed.    Lab Results  Component Value Date   HGBA1C 5.3 09/13/2012    Roxan Hockey M.D on  09/22/2018 at 4:50  PM  Pager---[phone] Go to [email or web address] - password [email or web address] for contact info  [facility] - Office  [phone]

## 2018-09-22 NOTE — Progress Notes (Signed)
Physical Therapy Treatment Patient Details Name: Patricia Murillo MRN: [id number] DOB: July 13, 1946 Today's Date: 09/22/2018    History of Present Illness 72 y.o. 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  During her hospitalization she underwent left axillary lymph node biopsy. Pt admitted with LUE edema, weakness, dyspnea. 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     PT Comments    Pt assisted with rolling for pericare and then to EOB.  Pt agreeable to use Stedy for OOB to recliner.  Pt appeared a little anxious however improved with encouragement and reassurance of safety.  Continue to recommend SNF upon d/c.    Follow Up Recommendations  SNF     Equipment Recommendations  Rolling walker with 5" wheels;3in1 (PT)    Recommendations for Other Services       Precautions / Restrictions Precautions Precautions: Fall    Mobility  Bed Mobility Overal bed mobility: Needs Assistance Bed Mobility: Rolling;Sidelying to Sit Rolling: Total assist Sidelying to sit: Max assist       General bed mobility comments: required mod-max assist for rolling left and total assist for rolling right, assist for trunk upright and LEs over EOB upon sitting  Transfers Overall transfer level: Needs assistance     Sit to Stand: From elevated surface;Mod assist;+2 physical assistance         General transfer comment: +2 mod assist to rise, assisted with maintaining grip on L hand; utilized stedy for pt due to buckling last session, pt reports "buckling" this session however likely just anxiety as pt was already seated in stedy; bed to recliner with stedy  Ambulation/Gait                 Stairs             Wheelchair Mobility    Modified Rankin (Stroke Patients Only)       Balance                                             Cognition Arousal/Alertness: Awake/alert Behavior During Therapy: WFL for tasks assessed/performed Overall Cognitive Status: Within Functional Limits for tasks assessed                                        Exercises General Exercises - Lower Extremity Ankle Circles/Pumps: AROM;10 reps;Seated;Both Short Arc Quad: AROM;10 reps;Both;Seated Hip Flexion/Marching: AROM;Both;5 reps;Seated    General Comments        Pertinent Vitals/Pain Pain Assessment: No/denies pain Pain Intervention(s): Repositioned    Home Living                      Prior Function            PT Goals (current goals can now be found in the care plan section) Acute Rehab PT Goals PT Goal Formulation: With patient/family Time For Goal Achievement: 10/06/18 Potential to Achieve Goals: Fair Progress towards PT goals: Progressing toward goals    Frequency    Min 2X/week      PT Plan Current plan remains appropriate    Co-evaluation PT/OT/SLP Co-Evaluation/Treatment: Yes Reason for Co-Treatment: Complexity of  the patient's impairments (multi-system involvement) PT goals addressed during session: Mobility/safety with mobility OT goals addressed during session: ADL's and self-care;Strengthening/ROM      AM-PAC PT "6 Clicks" Mobility   Outcome Measure  Help needed turning from your back to your side while in a flat bed without using bedrails?: Total Help needed moving from lying on your back to sitting on the side of a flat bed without using bedrails?: A Lot Help needed moving to and from a bed to a chair (including a wheelchair)?: Total Help needed standing up from a chair using your arms (e.g., wheelchair or bedside chair)?: Total Help needed to walk in hospital room?: Total Help needed climbing 3-5 steps with a railing? : Total 6 Click Score: 7    End of Session Equipment Utilized During Treatment: Gait belt;Oxygen Activity Tolerance:  Patient tolerated treatment well Patient left: in chair;with family/visitor present;with call bell/phone within reach   PT Visit Diagnosis: Other abnormalities of gait and mobility (R26.89);Muscle weakness (generalized) (M62.81)     Time: 1401-1430 PT Time Calculation (min) (ACUTE ONLY): 29 min  Charges:  $Therapeutic Activity: 8-22 mins                     Carmelia Bake, PT, DPT Acute Rehabilitation Services Office: [phone] Pager: [phone]  Trena Platt 09/22/2018, 3:43 PM

## 2018-09-22 NOTE — Progress Notes (Signed)
Occupational Therapy Treatment Patient Details Name: Patricia Murillo MRN: [id number] DOB: 1946-07-28 Today's Date: 09/22/2018    History of present illness 72 y.o. 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  During her hospitalization she underwent left axillary lymph node biopsy. Pt admitted with LUE edema, weakness, dyspnea. 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    OT comments  Pt sat EOB for a few minutes, then used stedy lift for transfer to chair, which was padded with pillows.  Maximove sling placed under her.  Performed LUE exercises and positioned for edema.  Pt reports she is using RUE for self feeding and grooming now  Follow Up Recommendations  SNF    Equipment Recommendations  None recommended by OT    Recommendations for Other Services      Precautions / Restrictions Precautions Precautions: Fall Restrictions Weight Bearing Restrictions: No       Mobility Bed Mobility Overal bed mobility: Needs Assistance Bed Mobility: Rolling;Sidelying to Sit Rolling: Total assist Sidelying to sit: Max assist       General bed mobility comments: required mod-max assist for rolling left and total assist for rolling right, assist for trunk upright and LEs over EOB upon sitting  Transfers Overall transfer level: Needs assistance     Sit to Stand: From elevated surface;Mod assist;+2 physical assistance         General transfer comment: +2 mod assist to rise, assisted with maintaining grip on L hand; utilized stedy for pt due to buckling last session, pt reports "buckling" this session however likely just anxiety as pt was already seated in stedy; bed to recliner with stedy    Balance       Sitting balance - Comments: held to steady bar when sitting EOB                                   ADL  either performed or assessed with clinical judgement   ADL                               Toileting- Clothing Manipulation and Hygiene: Total assistance;Bed level. Pt was incontinent x bowel               Vision       Perception     Praxis      Cognition Arousal/Alertness: Awake/alert Behavior During Therapy: WFL for tasks assessed/performed Overall Cognitive Status: Within Functional Limits for tasks assessed                                          Exercises General Exercises - Lower Extremity Ankle Circles/Pumps: AROM;10 reps;Seated;Both Short Arc Quad: AROM;10 reps;Both;Seated Hip Flexion/Marching: AROM;Both;5 reps;Seated Other Exercises Other Exercises: performed AAROM throughout LUE x 5 reps   Shoulder Instructions       General Comments      Pertinent Vitals/ Pain       Pain Assessment: No/denies pain Pain Intervention(s): Repositioned  Home Living  Prior Functioning/Environment              Frequency  Min 2X/week        Progress Toward Goals  OT Goals(current goals can now be found in the care plan section)  Progress towards OT goals: Progressing toward goals     Plan      Co-evaluation    PT/OT/SLP Co-Evaluation/Treatment: Yes Reason for Co-Treatment: Complexity of the patient's impairments (multi-system involvement) PT goals addressed during session: Mobility/safety with mobility OT goals addressed during session: Strengthening/ROM      AM-PAC OT "6 Clicks" Daily Activity     Outcome Measure   Help from another person eating meals?: A Little Help from another person taking care of personal grooming?: A Little Help from another person toileting, which includes using toliet, bedpan, or urinal?: Total Help from another person bathing (including washing, rinsing, drying)?: Total Help from another person to put on and taking off regular upper  body clothing?: Total Help from another person to put on and taking off regular lower body clothing?: Total 6 Click Score: 10    End of Session    OT Visit Diagnosis: Unsteadiness on feet (R26.81);Muscle weakness (generalized) (M62.81)   Activity Tolerance Patient tolerated treatment well   Patient Left in chair;with call bell/phone within reach;with family/visitor present   Nurse Communication (lift pad placed under pt)        Time: 8338-2505 OT Time Calculation (min): 32 min  Charges: OT General Charges $OT Visit: 1 Visit OT Treatments $Therapeutic Activity: 8-22 mins  Patricia Murillo, OTR/L Acute Rehabilitation Services [phone] [place] pager [phone] office 09/22/2018   Patricia Murillo 09/22/2018, 3:52 PM

## 2018-09-22 NOTE — Progress Notes (Signed)
PHARMACY - PHYSICIAN COMMUNICATION CRITICAL VALUE ALERT - BLOOD CULTURE IDENTIFICATION (BCID)  [name] is an 72 y.o. female who presented to [facility] on 09/07/2018 with a chief complaint of febrile neutropenia  Assessment:  Pt with febrile neutropenia and already started on cefepime 2gm iv q8hr (include suspected source if known)  Name of physician (or Provider) Contacted: Raliegh Ip Schorr  Current antibiotics: Cefepime 2gm iv q8hr  Changes to prescribed antibiotics recommended:  Patient is on recommended antibiotics - No changes needed  Results for orders placed or performed during the hospital encounter of 09/07/18  Blood Culture ID Panel (Reflexed) (Collected: 09/21/2018  8:13 AM)  Result Value Ref Range   Enterococcus species NOT DETECTED NOT DETECTED   Listeria monocytogenes NOT DETECTED NOT DETECTED   Staphylococcus species NOT DETECTED NOT DETECTED   Staphylococcus aureus (BCID) NOT DETECTED NOT DETECTED   Streptococcus species NOT DETECTED NOT DETECTED   Streptococcus agalactiae NOT DETECTED NOT DETECTED   Streptococcus pneumoniae NOT DETECTED NOT DETECTED   Streptococcus pyogenes NOT DETECTED NOT DETECTED   Acinetobacter baumannii NOT DETECTED NOT DETECTED   Enterobacteriaceae species NOT DETECTED NOT DETECTED   Enterobacter cloacae complex NOT DETECTED NOT DETECTED   Escherichia coli NOT DETECTED NOT DETECTED   Klebsiella oxytoca NOT DETECTED NOT DETECTED   Klebsiella pneumoniae NOT DETECTED NOT DETECTED   Proteus species NOT DETECTED NOT DETECTED   Serratia marcescens NOT DETECTED NOT DETECTED   Carbapenem resistance NOT DETECTED NOT DETECTED   Haemophilus influenzae NOT DETECTED NOT DETECTED   Neisseria meningitidis NOT DETECTED NOT DETECTED   Pseudomonas aeruginosa DETECTED (A) NOT DETECTED   Candida albicans NOT DETECTED NOT DETECTED   Candida glabrata NOT DETECTED NOT DETECTED   Candida krusei NOT DETECTED NOT DETECTED   Candida parapsilosis NOT DETECTED  NOT DETECTED   Candida tropicalis NOT DETECTED NOT DETECTED    Nani Skillern Crowford 09/22/2018  7:15 AM

## 2018-09-22 NOTE — Consult Note (Signed)
[place] Nurse wound consult note Reason for Consult: sacrum and labia Labia appears normal, some darkening but I think based on her skin tone this is normal no open areas noted on the labia Wound type: Unstageable pressure injury; coccyx  Pressure Injury POA:No Measurement:3.5cm x 1.5cm x 0cm  Wound bed:100% brown/yellow adherent slough Drainage (amount, consistency, odor) minimal  Periwound: intact  Dressing procedure/placement/frequency: Add enzymatic debridement ointment to pressure injury Offload with low air loss mattress which is in place Manage incontinence with female external urinary collection device, making sure to reposition with each turn and with q shift assessment Turn from side to side. Patient is able to do this.   [place] Nurse team will follow with you and see patient within 10 days for wound assessments.  Please notify [place] nurses of any acute changes in the wounds or any new areas of concern [place] MSN, [place], [place], [place]

## 2018-09-23 DIAGNOSIS — B965 Pseudomonas (aeruginosa) (mallei) (pseudomallei) as the cause of diseases classified elsewhere: Secondary | ICD-10-CM

## 2018-09-23 DIAGNOSIS — L89159 Pressure ulcer of sacral region, unspecified stage: Secondary | ICD-10-CM

## 2018-09-23 LAB — CBC WITH DIFFERENTIAL/PLATELET
Abs Immature Granulocytes: 0.66 10*3/uL — ABNORMAL HIGH (ref 0.00–0.07)
Basophils Absolute: 0.1 10*3/uL (ref 0.0–0.1)
Basophils Relative: 1 %
Eosinophils Absolute: 0 10*3/uL (ref 0.0–0.5)
Eosinophils Relative: 0 %
HCT: 31.9 % — ABNORMAL LOW (ref 36.0–46.0)
HEMOGLOBIN: 10.5 g/dL — AB (ref 12.0–15.0)
Immature Granulocytes: 5 %
Lymphocytes Relative: 1 %
Lymphs Abs: 0.1 10*3/uL — ABNORMAL LOW (ref 0.7–4.0)
MCH: 28.8 pg (ref 26.0–34.0)
MCHC: 32.9 g/dL (ref 30.0–36.0)
MCV: 87.6 fL (ref 80.0–100.0)
Monocytes Absolute: 0.9 10*3/uL (ref 0.1–1.0)
Monocytes Relative: 7 %
Neutro Abs: 11 10*3/uL — ABNORMAL HIGH (ref 1.7–7.7)
Neutrophils Relative %: 86 %
Platelets: 239 10*3/uL (ref 150–400)
RBC: 3.64 MIL/uL — ABNORMAL LOW (ref 3.87–5.11)
RDW: 15 % (ref 11.5–15.5)
WBC: 12.7 10*3/uL — ABNORMAL HIGH (ref 4.0–10.5)
nRBC: 0 % (ref 0.0–0.2)

## 2018-09-23 MED ORDER — GUAIFENESIN-DM 100-10 MG/5ML PO SYRP
5.0000 mL | ORAL_SOLUTION | ORAL | Status: DC | PRN
  Administered 2018-09-25 (×3): 5 mL via ORAL
  Filled 2018-09-23 (×3): qty 10

## 2018-09-23 MED ORDER — BENZONATATE 100 MG PO CAPS
200.0000 mg | ORAL_CAPSULE | Freq: Three times a day (TID) | ORAL | Status: DC | PRN
  Administered 2018-09-25 – 2018-09-28 (×4): 200 mg via ORAL
  Filled 2018-09-23 (×5): qty 2

## 2018-09-23 MED ORDER — ALTEPLASE 2 MG IJ SOLR
2.0000 mg | Freq: Once | INTRAMUSCULAR | Status: DC | PRN
  Filled 2018-09-23: qty 2

## 2018-09-23 NOTE — Consult Note (Signed)
WOC consulted for sacral wound, please see consult note from 09/22/18. Orders for wound care entered at that time.   [place], [place], [place]

## 2018-09-23 NOTE — Progress Notes (Signed)
Received consult for occluded lumen. Cap changed purple lumen flushed, blood return noted. OK to use lumen.

## 2018-09-23 NOTE — Progress Notes (Signed)
Patricia Murillo   DOB:17-Dec-1945   LF#:[id number]   [phone]  Hem/Onc follow up note   Subjective: Pt is clinically stable, afebrile, still bedbound, feels weak.   Objective:  Vitals:   09/23/18 1348 09/23/18 1402  BP:  (!) 159/65  Pulse:  90  Resp:  18  Temp:    SpO2: 95% 96%    Body mass index is 38.41 kg/m.  Intake/Output Summary (Last 24 hours) at 09/23/2018 1754 Last data filed at 09/23/2018 1200 Gross per 24 hour  Intake 950 ml  Output 1800 ml  Net -850 ml     Sclerae unicteric  Oropharynx clear  (+) bulky adenopathy at left axilla and left upper front chest have overall improved   Lungs clear -- scatter wheezing and rhonchi through both lungs   Heart regular rate and rhythm  Abdomen benign, soft   MSK no focal spinal tenderness, (+) severe edema of the left upper arm and leg, and bilateral lower extremity  Neuro: alert, and follow commands. Pupil right bigger than left, both reactive, mild (+) ptosis of left eye. Her left upper extremity strength is significantly reduced. She moves all extremities    CBG (last 3)  No results for input(s): GLUCAP in the last 72 hours.   Labs:  CBC Latest Ref Rng & Units 09/23/2018 09/22/2018 09/21/2018  WBC 4.0 - 10.5 K/uL 12.7(H) 2.2(L) 0.4(LL)  Hemoglobin 12.0 - 15.0 g/dL 10.5(L) 7.0(L) 8.0(L)  Hematocrit 36.0 - 46.0 % 31.9(L) 22.1(L) 24.5(L)  Platelets 150 - 400 K/uL 239 129(L) 111(L)    CMP Latest Ref Rng & Units 09/22/2018 09/21/2018 09/20/2018  Glucose 70 - 99 mg/dL 89 96 110(H)  BUN 8 - 23 mg/dL 16 13 13   Creatinine 0.44 - 1.00 mg/dL 0.41(L) 0.46 0.36(L)  Sodium 135 - 145 mmol/L 137 133(L) 135  Potassium 3.5 - 5.1 mmol/L 3.1(L) 3.5 3.8  Chloride 98 - 111 mmol/L 106 94(L) 98  CO2 22 - 32 mmol/L 27 29 29   Calcium 8.9 - 10.3 mg/dL 7.9(L) 7.7(L) 8.4(L)  Total Protein 6.5 - 8.1 g/dL 4.6(L) - 5.0(L)  Total Bilirubin 0.3 - 1.2 mg/dL 1.9(H) - 2.1(H)  Alkaline Phos 38 - 126 U/L 57 - 61  AST 15 - 41 U/L 9(L) - 16   ALT 0 - 44 U/L 14 - 18     Urine Studies No results for input(s): UHGB, CRYS in the last 72 hours.  Invalid input(s): UACOL, UAPR, USPG, UPH, UTP, UGL, UKET, UBIL, UNIT, UROB, [place], UEPI, UWBC, Duwayne Heck [place], [place]  Basic Metabolic Panel: Recent Labs  Lab 09/18/18 0422 09/19/18 0401 09/20/18 0450 09/21/18 0607 09/22/18 0546  NA 141 140 135 133* 137  K 3.1* 3.6 3.8 3.5 3.1*  CL 99 98 98 94* 106  CO2 34* 35* 29 29 27   GLUCOSE 105* 110* 110* 96 89  BUN 17 15 13 13 16   CREATININE 0.38* 0.40* 0.36* 0.46 0.41*  CALCIUM 8.9 8.7* 8.4* 7.7* 7.9*  MG 1.8 2.1  --   --   --    GFR Estimated Creatinine Clearance: 80.2 mL/min (A) (by C-G formula based on SCr of 0.41 mg/dL (L)). Liver Function Tests: Recent Labs  Lab 09/19/18 0401 09/20/18 0450 09/22/18 0546  AST 23 16 9*  ALT 20 18 14   ALKPHOS 61 61 57  BILITOT 2.1* 2.1* 1.9*  PROT 5.2* 5.0* 4.6*  ALBUMIN 3.5 3.2* 2.4*   No results for input(s): LIPASE, AMYLASE in the last 168 hours. No  results for input(s): AMMONIA in the last 168 hours. Coagulation profile No results for input(s): INR, PROTIME in the last 168 hours.  CBC: Recent Labs  Lab 09/17/18 0600 09/18/18 0422 09/19/18 0401 09/20/18 0450 09/21/18 0607 09/22/18 0546 09/23/18 0950  WBC 1.6* 0.6* 0.1* 0.1* 0.4* 2.2* 12.7*  NEUTROABS 1.4* 0.5*  --   --  0.1* 1.6* 11.0*  HGB 8.7* 8.9* 8.8* 8.2* 8.0* 7.0* 10.5*  HCT 26.7* 26.8* 26.7* 25.2* 24.5* 22.1* 31.9*  MCV 88.1 86.5 87.5 88.7 87.5 89.5 87.6  PLT 54* 59* 60* 64* 111* 129* 239   Cardiac Enzymes: No results for input(s): CKTOTAL, CKMB, CKMBINDEX, TROPONINI in the last 168 hours. BNP: Invalid input(s): POCBNP CBG: No results for input(s): GLUCAP in the last 168 hours. D-Dimer No results for input(s): DDIMER in the last 72 hours. Hgb A1c No results for input(s): HGBA1C in the last 72 hours. Lipid Profile No results for input(s): CHOL, HDL, LDLCALC, TRIG, CHOLHDL, LDLDIRECT in the last 72  hours. Thyroid function studies No results for input(s): TSH, T4TOTAL, T3FREE, THYROIDAB in the last 72 hours.  Invalid input(s): FREET3 Anemia work up Recent Labs    09/22/18 0546  RETICCTPCT 2.8   Microbiology Recent Results (from the past 240 hour(s))  Culture, blood (routine x 2)     Status: Abnormal (Preliminary result)   Collection Time: 09/21/18  8:13 AM  Result Value Ref Range Status   Specimen Description   Final    BLOOD RIGHT ANTECUBITAL Performed at [facility], [address], [place], [place] [address]    Special Requests   Final    BOTTLES DRAWN AEROBIC AND ANAEROBIC Blood Culture adequate volume Performed at [facility] [address], [place], [place] [address]    Culture  Setup Time   Final    GRAM NEGATIVE RODS AEROBIC BOTTLE ONLY CRITICAL RESULT CALLED TO, READ BACK BY AND VERIFIED WITHLavell Luster [facility] 8756 09/22/18 A BROWNING    Culture (A)  Final    PSEUDOMONAS AERUGINOSA SUSCEPTIBILITIES TO FOLLOW Performed at [facility], [place] [address] [place], [place] [address]    Report Status PENDING  Incomplete  Blood Culture ID Panel (Reflexed)     Status: Abnormal   Collection Time: 09/21/18  8:13 AM  Result Value Ref Range Status   Enterococcus species NOT DETECTED NOT DETECTED Final   Listeria monocytogenes NOT DETECTED NOT DETECTED Final   Staphylococcus species NOT DETECTED NOT DETECTED Final   Staphylococcus aureus (BCID) NOT DETECTED NOT DETECTED Final   Streptococcus species NOT DETECTED NOT DETECTED Final   Streptococcus agalactiae NOT DETECTED NOT DETECTED Final   Streptococcus pneumoniae NOT DETECTED NOT DETECTED Final   Streptococcus pyogenes NOT DETECTED NOT DETECTED Final   Acinetobacter baumannii NOT DETECTED NOT DETECTED Final   Enterobacteriaceae species NOT DETECTED NOT DETECTED Final   Enterobacter cloacae complex NOT DETECTED NOT DETECTED Final   Escherichia coli NOT DETECTED NOT DETECTED Final    Klebsiella oxytoca NOT DETECTED NOT DETECTED Final   Klebsiella pneumoniae NOT DETECTED NOT DETECTED Final   Proteus species NOT DETECTED NOT DETECTED Final   Serratia marcescens NOT DETECTED NOT DETECTED Final   Carbapenem resistance NOT DETECTED NOT DETECTED Final   Haemophilus influenzae NOT DETECTED NOT DETECTED Final   Neisseria meningitidis NOT DETECTED NOT DETECTED Final   Pseudomonas aeruginosa DETECTED (A) NOT DETECTED Final    Comment: CRITICAL RESULT CALLED TO, READ BACK BY AND VERIFIED WITH: J GRIMSLEY  PHARMD 0263 09/22/18 A BROWNING    Candida albicans NOT DETECTED NOT DETECTED Final   Candida glabrata NOT DETECTED NOT DETECTED Final   Candida krusei NOT DETECTED NOT DETECTED Final   Candida parapsilosis NOT DETECTED NOT DETECTED Final   Candida tropicalis NOT DETECTED NOT DETECTED Final    Comment: Performed at [facility], [place] [address], [place], [place] [address]  Culture, blood (routine x 2)     Status: None (Preliminary result)   Collection Time: 09/21/18  8:56 AM  Result Value Ref Range Status   Specimen Description   Final    BLOOD RIGHT HAND Performed at [facility] [address] [place], [place] [address]    Special Requests   Final    BOTTLES DRAWN AEROBIC AND ANAEROBIC Blood Culture results may not be optimal due to an inadequate volume of blood received in culture bottles Performed at [facility] [address], [place], [place] [address]    Culture   Final    NO GROWTH 2 DAYS Performed at [facility] [address] [place], [place] [address]    Report Status PENDING  Incomplete  Urine Culture     Status: Abnormal (Preliminary result)   Collection Time: 09/21/18  3:43 PM  Result Value Ref Range Status   Specimen Description   Final    URINE, CLEAN CATCH Performed at [facility], [place] [address], [place], [place] [address]    Special Requests   Final     Immunocompromised Performed at [facility], [place] [address], [place], [place] [address]    Culture (A)  Final    20,000 COLONIES/mL PSEUDOMONAS AERUGINOSA 20,000 COLONIES/mL ENTEROCOCCUS FAECALIS    Report Status PENDING  Incomplete   Organism ID, Bacteria PSEUDOMONAS AERUGINOSA (A)  Final      Susceptibility   Pseudomonas aeruginosa - MIC*    CEFTAZIDIME 2 SENSITIVE Sensitive     CIPROFLOXACIN <=0.25 SENSITIVE Sensitive     GENTAMICIN <=1 SENSITIVE Sensitive     IMIPENEM 2 SENSITIVE Sensitive     PIP/TAZO 8 SENSITIVE Sensitive     CEFEPIME 2 SENSITIVE Sensitive     * 20,000 COLONIES/mL PSEUDOMONAS AERUGINOSA      Studies:  No results found.  Assessment: 72 y.o. with newly diagnosed high-grade B-cell lymphoma, recently discharged from hospital after biopsy, presented to the emergency room with progressive weakness, dyspnea, chills and fever, anorexia, and extremity edema.  1.  Pseudomonas UTI and bacteremia 2. Neutropenia resolved  3.  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 Worsening anemia secondary to chemo, probable mild GI bleeding  6.  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 Hypercalcemia secondary to malignancy, s/p zometa 12/5, resolved now  8. Severe arm and leg edema, secondary to severe adenopathy and hypoalbuminemia, improving  9. Deconditioning  10 left ptosis and dilated right pupil  11.  Thrombocytopenia resolved now  12. Volume overload 13.  Hypoxic respite failure, stable 14.  Sacral pressure ulcers   Plan:  -Her neutropenia has resolved, she is responding well to antibiotics, need repeat blood culture to ensure bacteremia resolves, managed by primary team -She responded well to  blood transfusion, her other cytopenia has resolved -Continue prednisone  at 20 mg, will decrease after a week  -She may be discharged to nursing home early next week -Her second cycle chemotherapy is due next Friday, will arrange it as out pt  -she needs rehab, likely will take a while to improve  -I will f/u on Monday    Truitt Merle  09/23/2018

## 2018-09-23 NOTE — Progress Notes (Signed)
Patient Demographics:    Patricia Murillo, is a 72 y.o. female, DOB - July 04, 1946, OOI:[id number]  Admit date - 09/07/2018   Admitting Physician Mauricio Gerome Apley, MD  Outpatient Primary MD for the patient is Patient, No Pcp Per  LOS - 34   Chief Complaint  Patient presents with  . 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 with newly diagnosed high-grade B-cell lymphoma, recently discharged from hospital after biopsy, presented to the emergency room with progressive weakness, dyspnea, chills and fever, anorexia, and extremity edema, found to have Pseudomonas UTI and presumed Pseudomonas bacteremia   PLan:-  1) Neutropenic fevers/pseudomonas aeruginosa bacteremia/Pseudomonas UTI--currently afebrile, WBC > 12, Granix discontinued , Preliminary blood cultures from 09/21/2018 with pseudomonas aeruginosa, urine culture also with Pseudomonas aeruginosa,, chest x-ray without acute findings, continue IV cefepime started 09/21/2018, verbal consultation on 09/22/18 with Dr Talbot Grumbling and IID Pharmacist   2)Diffuse large B-cell lymphoma, activated B-cell type--- per Dr Burr Medico R-[facility] should be continued once acute issues resolve  3)Thrombocytopenia--due to lymphoma and chemo , platelet count trending up, platelets now > 239k  4)Autoimmune  hemolytic anemia, 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 Burr Medico  Appreciated  5) hypercalcemia secondary to malignancy- s/p Zometa  6)Acute hypoxic respiratory failure. Likely related to progressive high-grade B-cell lymphoma with pulmonary involvement comp located by suspected aspiration--- respiratory status actually improving, continue bronchodilators, continue IV cefepime on 09/21/2018 as above #1  7) acute toxic and metabolic encephalopathy----mentation is back to baseline as per family, multi-factorial including malignancy, hypercalcemia ,   8) moderate protein caloric malnutrition----due to underlying malignancy and poor oral intake, patient has low albumin and edema--- nutritional supplements advised, c/n Megace  9) significant left upper extremity swelling and weakness----due to lymphatic obstruction by high-grade lymphoma, venous Dopplers negative of the DVT on 08/29/2018  10)Pupil Discrepancy -Noted to be new -No Vision loss -Head CT Negative -Continue to Monitor   11)unstageable pressure injury in the sacrococcygeal area- 3.5cm x 1.5cm x 0cm --- please see wound care recommendations from ostomy RN dated 09/22/2018  Disposition/Need for in-Hospital Stay- patient unable to be discharged at this time due to Pseudomonas bacteremia and UTI in an immunocompromised patient requiring IV antibiotics, also awaiting insurance approval for SNF placement   Code Status : FULL    Disposition Plan  : TBD  Consults  :  oncology   DVT Prophylaxis  :  TEDs -  SCDs   Lab Results  Component Value Date   PLT 239 09/23/2018    Inpatient Medications  Scheduled Meds: . sodium chloride   Intravenous Once  . sodium chloride   Intravenous Once  . sodium chloride   Intravenous Once  . acyclovir   400 mg Oral BID  . allopurinol  300 mg Oral Daily  . cholecalciferol  2,000 Units Oral Daily  . collagenase   Topical Daily  . famotidine  20 mg Oral Daily  . feeding supplement (ENSURE ENLIVE)  237 mL Oral BID BM  . fluticasone  2 spray Each Nare Daily  . folic acid  1 mg Oral Daily  . levalbuterol  0.63 mg Nebulization TID  . loratadine  10 mg Oral Daily  . mouth rinse  15 mL Mouth Rinse BID  . megestrol  400 mg Oral Daily  . mupirocin ointment   Topical BID  . pantoprazole (PROTONIX) IV  40 mg Intravenous Q12H  . polyethylene glycol  17 g Oral Daily  . predniSONE  20 mg Oral Q breakfast  . senna-docusate  1 tablet Oral BID   Continuous Infusions: . sodium chloride 250 mL (09/23/18 0649)  . sodium chloride    . ceFEPime (MAXIPIME) IV 2 g (09/23/18 1224)   PRN Meds:.sodium chloride, sodium chloride, acetaminophen **OR** [DISCONTINUED] acetaminophen, alteplase, alum & mag hydroxide-simeth **AND** lidocaine, benzonatate, guaiFENesin-dextromethorphan, HYDROcodone-acetaminophen, lidocaine, lip balm, meclizine, ondansetron **OR** ondansetron (ZOFRAN) IV, sodium chloride flush    Anti-infectives (From admission, onward)   Start     Dose/Rate Route Frequency Ordered Stop   09/21/18 1200  ceFEPIme (MAXIPIME) 2 g in sodium chloride 0.9 % 100 mL IVPB     2 g 200 mL/hr over 30 Minutes Intravenous Every 8 hours 09/21/18 1027     09/12/18 1600  ampicillin-sulbactam (UNASYN) 1.5 g in sodium chloride 0.9 % 100 mL IVPB  Status:  Discontinued     1.5 g 200 mL/hr over 30 Minutes Intravenous Every 6 hours 09/12/18 1408 09/15/18 0753   09/10/18 1000  acyclovir (ZOVIRAX) 200 MG capsule 400 mg     400 mg Oral 2 times daily 09/10/18 0754     09/07/18 2000  cefTRIAXone (ROCEPHIN) 1 g in sodium chloride 0.9 % 100 mL IVPB  Status:  Discontinued     1 g 200 mL/hr over 30 Minutes Intravenous Every 24 hours 09/07/18 1844 09/12/18 0940   09/07/18 0830  ceFEPIme (MAXIPIME) 2 g in sodium chloride 0.9 % 100  mL IVPB     2 g 200 mL/hr over 30 Minutes Intravenous  Once 09/07/18 0820 09/07/18 0912   09/07/18 0830  metroNIDAZOLE (FLAGYL) IVPB 500 mg  Status:  Discontinued     500 mg 100 mL/hr over 60 Minutes Intravenous Every 8 hours 09/07/18 0820 09/08/18 1812   09/07/18 0830  vancomycin (VANCOCIN) IVPB 1000 mg/200 mL premix     1,000 mg 200 mL/hr over 60 Minutes Intravenous  Once 09/07/18 0820 09/07/18 1157        Objective:   Vitals:   09/23/18 0639 09/23/18 0818 09/23/18 1348 09/23/18 1402  BP: (!) 157/66   (!) 159/65  Pulse: 87   90  Resp: 20   18  Temp: 98.3 F (36.8 C)     TempSrc: Oral     SpO2: 94% 92% 95% 96%  Weight:      Height:        Wt Readings from Last 3 Encounters:  09/22/18 108 kg  08/30/18 90.2 kg  08/04/18 83.5 kg     Intake/Output Summary (Last 24 hours) at 09/23/2018 1807 Last data filed at 09/23/2018 1200 Gross per 24 hour  Intake 950 ml  Output 1800 ml  Net -850 ml     Physical Exam Patient is examined daily including today on 09/23/18 , exams remain the same as of yesterday except that has changed   Gen:- Awake, more Alert,  In no apparent distress  HEENT:- [place].AT, No sclera icterus Neck-Supple Neck,No JVD,.  Lungs-  CTAB , fair symmetrical air movement CV- S1, S2 normal, regular  Abd-  +ve B.Sounds, Abd Soft, No tenderness,    Extremity:- pedal pulses present,significant edema of left upper extremity, bilateral lower extremity edema improving slowly, right upper extremity PICC line site w/o evidence of inflammation or infection Psych-affect is appropriate, oriented x3 Neuro-no new focal deficits, no tremors Skin-unstageable pressure injury in the sacrococcygeal area- 3.5cm x 1.5cm x 0cm --- please see photo below Media Information   Document Information   Photos    09/23/2018 12:28  Attached To:  Hospital Encounter on 09/07/18  Source Information   Roxan Hockey, MD  [facility] Oncology      Data Review:   Micro  Results Recent Results (from the past 240 hour(s))  Culture, blood (routine x 2)     Status: Abnormal (Preliminary result)   Collection Time: 09/21/18  8:13 AM  Result Value Ref Range Status   Specimen Description   Final    BLOOD RIGHT ANTECUBITAL Performed at [facility], [place] [address], [place], [place] [address]    Special Requests   Final    BOTTLES DRAWN AEROBIC AND ANAEROBIC Blood Culture adequate volume Performed at [place] [address], [place], [place] [address]    Culture  Setup Time   Final    GRAM NEGATIVE RODS AEROBIC BOTTLE ONLY CRITICAL RESULT CALLED TO, READ BACK BY AND VERIFIED WITHLavell Luster [facility] 4765 09/22/18 A BROWNING    Culture (A)  Final    PSEUDOMONAS AERUGINOSA SUSCEPTIBILITIES TO FOLLOW Performed at [facility], [place] [address] [place], [place] [address]    Report Status PENDING  Incomplete  Blood Culture ID Panel (Reflexed)     Status: Abnormal   Collection Time: 09/21/18  8:13 AM  Result Value Ref Range Status   Enterococcus species NOT DETECTED NOT DETECTED Final   Listeria monocytogenes NOT DETECTED NOT DETECTED Final   Staphylococcus species NOT DETECTED NOT DETECTED Final   Staphylococcus aureus (BCID) NOT DETECTED NOT DETECTED Final   Streptococcus species NOT DETECTED NOT DETECTED Final   Streptococcus agalactiae NOT DETECTED NOT DETECTED Final   Streptococcus pneumoniae NOT DETECTED NOT DETECTED Final   Streptococcus pyogenes NOT DETECTED NOT DETECTED Final   Acinetobacter baumannii NOT DETECTED NOT DETECTED Final   Enterobacteriaceae species NOT DETECTED NOT DETECTED Final   Enterobacter cloacae complex NOT DETECTED NOT DETECTED Final   Escherichia coli NOT DETECTED NOT DETECTED Final   Klebsiella oxytoca NOT DETECTED NOT DETECTED Final   Klebsiella pneumoniae NOT DETECTED NOT DETECTED Final   Proteus species NOT DETECTED NOT DETECTED Final   Serratia marcescens NOT DETECTED NOT DETECTED Final    Carbapenem resistance NOT DETECTED NOT DETECTED Final   Haemophilus influenzae NOT DETECTED NOT DETECTED Final   Neisseria meningitidis NOT DETECTED NOT DETECTED Final   Pseudomonas aeruginosa DETECTED (A) NOT DETECTED Final    Comment: CRITICAL RESULT CALLED TO, READ BACK BY  AND VERIFIED WITHLavell Luster [facility] 4166 09/22/18 A BROWNING    Candida albicans NOT DETECTED NOT DETECTED Final   Candida glabrata NOT DETECTED NOT DETECTED Final   Candida krusei NOT DETECTED NOT DETECTED Final   Candida parapsilosis NOT DETECTED NOT DETECTED Final   Candida tropicalis NOT DETECTED NOT DETECTED Final    Comment: Performed at [facility], [place] [address], [place], [place] [address]  Culture, blood (routine x 2)     Status: None (Preliminary result)   Collection Time: 09/21/18  8:56 AM  Result Value Ref Range Status   Specimen Description   Final    BLOOD RIGHT HAND Performed at [place] [address], [place], [place] [address]    Special Requests   Final    BOTTLES DRAWN AEROBIC AND ANAEROBIC Blood Culture results may not be optimal due to an inadequate volume of blood received in culture bottles Performed at [facility] [address], [place], [place] [address]    Culture   Final    NO GROWTH 2 DAYS Performed at [place] [address] [place], [place] [address]    Report Status PENDING  Incomplete  Urine Culture     Status: Abnormal (Preliminary result)   Collection Time: 09/21/18  3:43 PM  Result Value Ref Range Status   Specimen Description   Final    URINE, CLEAN CATCH Performed at [facility], [place] [address] [place], [place] [address]    Special Requests   Final    Immunocompromised Performed at [facility], [place] [address], [place], [place] [address]    Culture (A)  Final    20,000 COLONIES/mL PSEUDOMONAS AERUGINOSA 20,000 COLONIES/mL ENTEROCOCCUS FAECALIS    Report  Status PENDING  Incomplete   Organism ID, Bacteria PSEUDOMONAS AERUGINOSA (A)  Final      Susceptibility   Pseudomonas aeruginosa - MIC*    CEFTAZIDIME 2 SENSITIVE Sensitive     CIPROFLOXACIN <=0.25 SENSITIVE Sensitive     GENTAMICIN <=1 SENSITIVE Sensitive     IMIPENEM 2 SENSITIVE Sensitive     PIP/TAZO 8 SENSITIVE Sensitive     CEFEPIME 2 SENSITIVE Sensitive     * 20,000 COLONIES/mL PSEUDOMONAS AERUGINOSA    Radiology Reports Dg Chest 2 View  Result Date: 09/17/2018 CLINICAL DATA:  Cough. EXAM: CHEST - 2 VIEW COMPARISON:  Radiograph of September 14, 2018. FINDINGS: Stable cardiomediastinal silhouette. Large hiatal hernia is noted. No pneumothorax is noted. Right-sided pacemaker is unchanged in position. Mild bilateral pleural effusions are noted with associated atelectasis. Bony thorax is unremarkable. IMPRESSION: Stable large hiatal hernia. Mild bilateral pleural effusions are noted with associated atelectasis. Electronically Signed   By: Marijo Conception, M.D.   On: 09/17/2018 14:16   Dg Chest 2 View  Result Date: 09/07/2018 CLINICAL DATA:  Increasing shortness of breath. Decreased oxygen saturation since yesterday. EXAM: CHEST - 2 VIEW COMPARISON:  Single-view of the chest and CT chest 08/29/2018. FINDINGS: There is cardiomegaly without edema. Moderate left pleural effusion and basilar atelectasis are seen. Pulmonary nodules in the left upper lobe and along the periphery of the left lung are identified as seen on prior CT. Small right pulmonary nodule seen on CT are difficult to visualize on this examination. Large hiatal hernia is noted. No pneumothorax. IMPRESSION: No change in a moderate left pleural effusion and left basilar atelectasis. Pulmonary nodules. Cardiomegaly without edema. Hiatal hernia. Electronically Signed   By:  Inge Rise M.D.   On: 106/01/202019 09:02   Ct Head Wo Contrast  Result Date: 09/07/2018 CLINICAL DATA:  Sepsis. Recent diagnosis of suspected metastatic  disease in chest abdomen and pelvis on CT studies. New left sided proptosis and left upper and lower extremity weakness. No reported injury. EXAM: CT HEAD WITHOUT CONTRAST TECHNIQUE: Contiguous axial images were obtained from the base of the skull through the vertex without intravenous contrast. COMPARISON:  07/15/2017 head CT. FINDINGS: Brain: No evidence of parenchymal hemorrhage or extra-axial fluid collection. No mass lesion, mass effect, or midline shift. No CT evidence of acute infarction. Nonspecific mild subcortical and periventricular white matter hypodensity, most in keeping with chronic small vessel ischemic change. Cerebral volume is age appropriate. No ventriculomegaly. Vascular: No acute abnormality. Skull: No evidence of calvarial fracture. Sinuses/Orbits: No fluid levels. Mild mucoperiosteal thickening in the maxillary sinuses bilaterally. Other:  The mastoid air cells are unopacified. IMPRESSION: 1.  No evidence of acute intracranial abnormality. 2. Mild chronic small vessel ischemic changes in the cerebral white matter. 3. Mild chronic appearing paranasal sinusitis. Electronically Signed   By: Ilona Sorrel M.D.   On: 106/01/202019 12:06   Ct Angio Chest Pe W/cm &/or Wo Cm  Result Date: 09/07/2018 CLINICAL DATA:  Shortness of breath and weakness. 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 EXAM: CT ANGIOGRAPHY CHEST WITH CONTRAST TECHNIQUE: Multidetector CT imaging of the chest was performed using the standard protocol during bolus administration of intravenous contrast. Multiplanar CT image reconstructions and MIPs were obtained to evaluate the vascular anatomy. CONTRAST:  141m ISOVUE-370 IOPAMIDOL (ISOVUE-370) INJECTION 76% COMPARISON:  CTA of the chest on 08/29/2018 FINDINGS: Cardiovascular: The pulmonary arteries are adequately opacified. There is no evidence of pulmonary embolism. The thoracic aorta is normal in caliber and demonstrates no evidence of  aneurysm or dissection. Proximal great vessels are normally patent. The heart size is stable and at the upper limits of normal. Pacemaker shows stable positioning. No pericardial fluid identified. Mediastinum/Nodes: Massive confluent lymphadenopathy of the left axilla extending into the left subpectoral region and encasing the region of the brachial plexus, left axillary vessels and left subclavian vessels appears progressively enlarged since the prior CTA. Index lateral subpectoral lymph node measured previously at 2.8 cm in short axis now measures approximately 4.1 cm. More inferior left axillary node measured at 2.5 cm in short axis previously now measures 3.1 cm. Confluent lymph node mass encasement of vessels does not cause arterial occlusion but may be causing significant mass effect on venous structures. The veins are not opacified on the study to determine whether any venous thrombus is present. Associated right hilar and mediastinal lymphadenopathy also appears slightly more prominent compared to the prior study. Largest area right hilar lymphadenopathy measures approximately 2.2 cm. Stable large hiatal hernia. Lungs/Pleura: There also is progressive pulmonary involvement with lymphoma with definite progression since the prior CTA. All of the multiple bilateral pulmonary nodules show enlargement since the prior study. Index mass in the subpleural left lower lobe now measures approximately 2.2 x 3.7 cm (previously 1.7 x 3.3 cm). All other lesions also show enlargement. New small right pleural effusion. Small to moderate left pleural effusion appears stable since the prior study. Upper Abdomen: Stable splenomegaly. Musculoskeletal: No chest wall abnormality. No acute or significant osseous findings. Review of the MIP images confirms the above findings. IMPRESSION: 1. No evidence of pulmonary embolism. 2. Progression of lymphoma since the prior CTA on 11/25 with enlargement of left axillary,  subpectoral and  chest wall lymph node masses with encasement of left subclavian and axillary vascular structures. Enlargement of right hilar and mediastinal lymph nodes since the prior study. Pulmonary involvement also shows significant progression with enlargement of all of the bilateral pulmonary nodule seen previously. 3. Normal arterial patency demonstrated of the left subclavian and axillary arteries. Venous patency cannot be determined on the CTA study and the patient would be at high risk of left upper extremity DVT given the large bulky lymphadenopathy present. 4. New small right pleural effusion. Stable small to moderate left pleural effusion. 5. Stable splenomegaly. Electronically Signed   By: Aletta Edouard M.D.   On: 1Jul 30, 202019 12:29   Ct Angio Chest Pe W Or Wo Contrast  Result Date: 08/29/2018 CLINICAL DATA:  Severe left arm swelling since yesterday. Hemoglobin L5. EXAM: CT ANGIOGRAPHY CHEST WITH CONTRAST TECHNIQUE: Multidetector CT imaging of the chest was performed using the standard protocol during bolus administration of intravenous contrast. Multiplanar CT image reconstructions and MIPs were obtained to evaluate the vascular anatomy. CONTRAST:  100 cc ISOVUE-370 IOPAMIDOL (ISOVUE-370) INJECTION 76% COMPARISON:  CXR 08/29/2018 FINDINGS: Cardiovascular: Conventional branch pattern of the great vessels with minimal atherosclerosis at the origins. No significant stenosis. Patent left subclavian artery traversing an area of ill-defined soft tissue edema and enlargement as described below. Mild aortic atherosclerosis without aneurysm or dissection. Satisfactory opacification of the pulmonary arteries to the proximal segmental level without large central pulmonary embolus. Mediastinum/Nodes: Left axillary nodal enlargement is identified ranging size from 0.3 cm to 2.8 cm short axis. Mildly enlarged right-sided axillary lymph nodes up to 1.3 cm short axis are identified. Large hiatal hernia with air-fluid level  noted within. 1.4 cm short axis mass abutting the left lateral aspect of the aortic arch in the prevascular space is noted more likely to represent a pulmonary lesion as opposed lymphadenopathy. Nonpathologic sized mediastinal and hilar lymph nodes are noted. Lungs/Pleura: Masslike opacities are identified in the lingula, the largest abutting the pleural surface measuring 3.3 x 1.7 cm on series 5/92 with previously mentioned paramediastinal mass in the left upper lobe measuring 2.3 x 1.4 cm, series 5/49. Superior segment left lower lobe mass measuring 1.8 x 1.3 cm, series 5/52 is also noted. There are smaller subcentimeter nodules within both upper lobes, right middle lobe and right lower lobe with largest masslike opacities in the right lower lobe measuring 1.1 x 1 cm, series 5/89 and abutting the right lateral margin of the hiatal hernia measuring 2.5 x 1.9 cm, series 5/105. Moderate left effusion with compressive atelectasis is identified with trace right pleural effusion. No pneumothorax. Upper Abdomen: No space-occupying mass of the included liver. The included right adrenal gland is unremarkable. Nodular soft tissue densities in the left upper quadrant are felt to represent partial imaging of small bowel less likely mesenteric adenopathy. The included spleen is top-normal in size. Musculoskeletal: Ill-defined asymmetric soft tissue enlargement in the region of the left pectoralis muscle suspicious for intramuscular hemorrhage and edema given reported rapid onset of left arm swelling and clinical report of low hemoglobin. Asymmetric subcutaneous edema of the partially included left breast with thickening of the skin concerning for possible possible inflammatory carcinoma of the breast. Right-sided pacemaker apparatus is noted with leads in the right atrium right ventricle. Thoracic spondylosis is noted. Review of the MIP images confirms the above findings. IMPRESSION: 1. No acute large central pulmonary  embolus, aortic aneurysm or dissection. 2. Multilobar masslike opacities scattered throughout both lungs as above described,  the largest in the lingula measuring up to 3.3 cm, presumed metastatic given left axillary adenopathy also noted. 3. Left axillary nodal enlargement measuring up to 2.8 cm short axis. 4. Asymmetric soft tissue enlargement of the left pectoralis muscle suspicious for intramuscular hemorrhage and edema given reported rapid onset of left arm swelling and clinical report of low hemoglobin. An infiltrating mass or metastasis is also a differential considerations given local adenopathy, asymmetric subcutaneous edema of the partially included left breast with thickening of the skin concerning for possible inflammatory carcinoma of the breast. Clinical correlation is therefore recommended as well as mammographic correlation. 5. Moderate left and trace right pleural effusions with compressive atelectasis. These results were called by telephone at the time of interpretation on 08/29/2018 at 3:43 pm to Dr. Karmen Bongo , who verbally acknowledged these results. Aortic Atherosclerosis (ICD10-I70.0). Electronically Signed   By: Ashley Royalty M.D.   On: 08/29/2018 15:43   Ct Abdomen Pelvis W Contrast  Result Date: 08/31/2018 CLINICAL DATA:  Pulmonary nodules on previous chest CT suspicious for metastatic disease. EXAM: CT ABDOMEN AND PELVIS WITH CONTRAST TECHNIQUE: Multidetector CT imaging of the abdomen and pelvis was performed using the standard protocol following bolus administration of intravenous contrast. CONTRAST:  171m OMNIPAQUE IOHEXOL 300 MG/ML  SOLN COMPARISON:  Chest CT 08/29/2018 FINDINGS: Lower chest: Pacer wires noted in the heart. Large hiatal hernia. Bilateral pleural effusions, left greater than right. Pulmonary nodules noted in both lower lungs. Hepatobiliary: No focal abnormality within the liver parenchyma. Liver measures 20.1 cm craniocaudal length. Dependent sludge or stones  noted in the gallbladder. No intrahepatic or extrahepatic biliary dilation. Pancreas: No focal mass lesion. No dilatation of the main duct. No intraparenchymal cyst. No peripancreatic edema. Spleen: Spleen measures 13.7 cm in craniocaudal length, upper normal to mildly increased. Adrenals/Urinary Tract: No adrenal nodule or mass. Kidneys unremarkable. Central sinus cysts noted left kidney. No evidence for hydroureter. The urinary bladder appears normal for the degree of distention. Stomach/Bowel: Large hiatal hernia. Duodenum is normally positioned as is the ligament of Treitz. Duodenal diverticulum noted. No small bowel wall thickening. No small bowel dilatation. The terminal ileum is normal. The appendix is not visualized, but there is no edema or inflammation in the region of the cecum. No gross colonic mass. No colonic wall thickening. Diverticular changes are noted in the left colon without evidence of diverticulitis. Vascular/Lymphatic: There is abdominal aortic atherosclerosis without aneurysm. Mild lymphadenopathy noted hepato duodenal ligament with 13 mm short axis index node visible on 21/3. Borderline para-aortic retroperitoneal lymphadenopathy evident. 10 mm short axis left para-aortic lymph node visible on 31/3. relatively bulky lymphadenopathy is seen along both pelvic sidewalls. 2.2 cm short axis left external iliac node is visible on 76/3. 2.8 cm short axis right external iliac lymph node associated with another 2.9 cm short axis node. 2.2 cm short axis lymph node identified in the right groin. Reproductive: Uterus unremarkable.  There is no adnexal mass. Other: No intraperitoneal free fluid. Musculoskeletal: Extensive body wall edema noted left lower hemithorax and left upper abdominal wall. Degenerative changes noted left hip. IMPRESSION: 1. Bulky pelvic sidewall lymphadenopathy with borderline to mild lymphadenopathy in the hepato duodenal ligament and para-aortic retroperitoneal space. Lymphoma  or metastatic disease would be primary considerations. 2. Hepatomegaly with borderline splenomegaly. Prominent portal and splenic veins raise the question of portal venous hypertension. 3. Gallbladder sludge or tiny stones. 4. Left chest wall and upper abdominal wall edema. 5. Large hiatal hernia with bilateral pleural effusions, similar  to prior. 6.  Aortic Atherosclerois (ICD10-170.0) Electronically Signed   By: Misty Stanley M.D.   On: 08/31/2018 13:13   Dg Chest Port 1 View  Result Date: 09/21/2018 CLINICAL DATA:  Tachypnea. EXAM: PORTABLE CHEST 1 VIEW COMPARISON:  Chest radiographs 2 days prior, chest CT 1January 19, 202019 FINDINGS: Right-sided pacemaker in place. Right upper extremity PICC in place, tip in the proximal SVC. The patient is significantly rotated. Bilateral pleural effusions and associated airspace disease, likely progressed at the left lung base. Nodular density in the periphery of the left lung again seen. Large retrocardiac hiatal hernia. Unchanged heart size and mediastinal contours. No pulmonary edema. IMPRESSION: Bilateral pleural effusions, increased on the left. Unchanged radiographic appearance of large hiatal hernia. Peripheral nodular opacity as seen on prior chest CT. Additional pulmonary nodules on CT are not visualized radiographically Electronically Signed   By: Keith Rake M.D.   On: 09/21/2018 03:42   Dg Chest Port 1 View  Addendum Date: 09/14/2018   ADDENDUM REPORT: 09/14/2018 09:14 ADDENDUM: The impression should read: Stable chest x-ray demonstrating pulmonary edema and bilateral pleural effusions. Electronically Signed   By: Aletta Edouard M.D.   On: 09/14/2018 09:14   Result Date: 09/14/2018 CLINICAL DATA:  Shortness of breath. History of progressive high-grade B-cell lymphoma. EXAM: PORTABLE CHEST 1 VIEW COMPARISON:  Chest x-ray on 09/09/2018 and CT of the chest on 1January 19, 202019. FINDINGS: Stable heart size, appearance of pacemaker and large hiatal hernia. Stable  probable bilateral pleural effusions and bilateral pulmonary nodules. No overt pulmonary edema. No pneumothorax. IMPRESSION: Stable chest x-ray demonstrating pulmonary lymphoma and bilateral pleural effusions. Electronically Signed: By: Aletta Edouard M.D. On: 09/11/2018 14:45   Dg Chest Port 1 View  Result Date: 09/14/2018 CLINICAL DATA:  Shortness of breath. EXAM: PORTABLE CHEST 1 VIEW COMPARISON:  Radiographs 09/11/2018 and 09/09/2018. FINDINGS: 0523 hours. Mild patient rotation to the right. The right arm PICC and right subclavian pacemaker leads appear unchanged. The heart size and mediastinal contours are stable. There is a large hiatal hernia. There are persistent bilateral pleural effusions which appear mildly improved on the right. There is no pneumothorax or confluent airspace opacity. IMPRESSION: No acute findings demonstrated. The right pleural effusion appears slightly smaller. Electronically Signed   By: Richardean Sale M.D.   On: 09/14/2018 09:10   Dg Chest Port 1 View  Result Date: 09/09/2018 CLINICAL DATA:  Hypoxia, history of progressive lymphoma EXAM: PORTABLE CHEST 1 VIEW COMPARISON:  1January 19, 202019 FINDINGS: Cardiac shadow is enlarged. Pacing device is again seen. Bilateral pleural effusions are again noted. The nodular changes seen on recent chest CT are not as well appreciated on today's exam. No pneumothorax is noted. IMPRESSION: Bilateral effusions. Patchy densities are noted consistent findings of recent CT although less well appreciated on today's exam. Electronically Signed   By: Inez Catalina M.D.   On: 09/09/2018 06:35   Dg Chest Portable 1 View  Result Date: 08/29/2018 CLINICAL DATA:  72 year old female with a history of shortness of breath and edema EXAM: PORTABLE CHEST 1 VIEW COMPARISON:  04/19/2018, 04/18/2018 FINDINGS: Cardiomediastinal silhouette unchanged in size and contour. Double density projects over the lower mediastinum is unchanged. On the frontal view there is a  Passy at the left base obscuring the left hemidiaphragm with blunting of left costophrenic angle, increased from the prior. No pneumothorax. Unchanged right chest wall cardiac pacing device with 2 leads in place. No confluent airspace disease on the right. IMPRESSION: New opacity at the left lung base obscuring left  hemidiaphragm in the left heart border, compatible with pleural effusion and associated atelectasis/consolidation. Hiatal hernia. Electronically Signed   By: Corrie Mckusick D.O.   On: 08/29/2018 09:09   Dg Abd Portable 1v  Result Date: 09/13/2018 CLINICAL DATA:  Constipation EXAM: PORTABLE ABDOMEN - 1 VIEW COMPARISON:  CT 08/31/2018 FINDINGS: Nonobstructed bowel gas pattern. Residual contrast within the colon. Mild to moderate feces retention at the rectum. IMPRESSION: Nonobstructed gas pattern with mild to moderate fecal retention at the rectum Electronically Signed   By: Donavan Foil M.D.   On: 09/13/2018 23:51   Dg Swallowing Func-speech Pathology  Result Date: 09/15/2018 Objective Swallowing Evaluation: Type of Study: MBS-Modified Barium Swallow Study  Patient Details Name: KAYLA DESHAIES MRN: [id number] Date of Birth: 1945/11/01 Today's Date: 09/15/2018 Time: SLP Start Time (ACUTE ONLY): 1210 -SLP Stop Time (ACUTE ONLY): 1240 SLP Time Calculation (min) (ACUTE ONLY): 30 min Past Medical History: Past Medical History: Diagnosis Date . Arthritis  . Hiatal hernia  . History of chicken pox  . Hyperlipidemia  . Pacemaker  Past Surgical History: Past Surgical History: Procedure Laterality Date . ANKLE SURGERY   . PACEMAKER IMPLANT N/A 04/18/2018  Procedure: PACEMAKER IMPLANT;  Surgeon: Evans Lance, MD;  Location: [place] [facility];  Service: Cardiovascular;  Laterality: N/A; HPI: CHRISTEL BAI is a 72 y.o. 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  During her hospitalization she underwent left axillary lymph node biopsy.  She was discharged home in a stable condition, but apparently she has been developing worsening weakness, generalized, worse with exertion, no improving factors, associated with malaise, chills and poor appetite.  Over the last 24 hours her symptoms have been more severe to the point where she has difficulty standing. She has persistent left upper extremity edema which has been painful, sharp in nature up to 10 out of 10 intensity.  Her weakness has led to dyspnea which has been severe in intensity, and triggered her emergency room visit today.Pt was made NPO yesterday due to concerns for aspiration.   Today pt is alert, has congested weak cough concerrning for airway protection.   Pt underwent MBS yesterday and was placed on full liquid diet with precautions. RN reports pt continues to cough with po intake.  Per note from MD, now plan is for pt to have small bore feeding tube placed for nutritional support.  Unfortunately pt did not tolerate attempt at insertion of feeding tube.  Repeat swallow evaluation ordered.  SLP follow up to inform family/pt of plan and determine if clinically pt appears improved.    Subjective: Alert Assessment / Plan / Recommendation CHL IP CLINICAL IMPRESSIONS 09/15/2018 Clinical Impression Patient had a MBS on 09/12/18 and patient presented with Moderately severe oropharyngeal dysphagia with sensorimotor deficits. She presents much stronger today and presents with a mild oropharyngeal dysphagia. Oral preparation and bolus transfer was mild evidenced by trace oral residue. Patient initiated swallow at the level of the pyriform sinuses with no penetration or aspiration with cup sips of all sizes. She did have penetration with cup sip with straw, but spontaneously cleared layngeal vestibule. She initiated swallow at the valleculae with puree and cracker bolus, no penetration or aspiration. Significant  pharyngeal residue present after 1st swallow, but improved mostly cleared with second spontaneous swallow. Upper 1/3 of esphagus observed to clear well with puree and tablet. Due  to significant sensorimotor improvement, recommend patient have regular diet with thin liquids, medication whole in puree. Speech therapy to follow up for continue diet tolerance and swallowing therapy. SLP Visit Diagnosis Dysphagia, oropharyngeal phase (R13.12) Attention and concentration deficit following -- Frontal lobe and executive function deficit following -- Impact on safety and function Mild aspiration risk   CHL IP TREATMENT RECOMMENDATION 09/15/2018 Treatment Recommendations Therapy as outlined in treatment plan below   Prognosis 09/15/2018 Prognosis for Safe Diet Advancement Good Barriers to Reach Goals -- Barriers/Prognosis Comment -- CHL IP DIET RECOMMENDATION 09/15/2018 SLP Diet Recommendations Regular solids;Thin liquid Liquid Administration via Cup;No straw Medication Administration Whole meds with liquid Compensations Slow rate;Small sips/bites;Other (Comment) Postural Changes Remain semi-upright after after feeds/meals (Comment);Seated upright at 90 degrees   CHL IP OTHER RECOMMENDATIONS 09/15/2018 Recommended Consults -- Oral Care Recommendations Oral care before and after PO Other Recommendations --   CHL IP FOLLOW UP RECOMMENDATIONS 09/14/2018 Follow up Recommendations (No Data)   CHL IP FREQUENCY AND DURATION 09/15/2018 Speech Therapy Frequency (ACUTE ONLY) min 2x/week Treatment Duration 1 week      CHL IP ORAL PHASE 09/15/2018 Oral Phase Impaired Oral - Pudding Teaspoon -- Oral - Pudding Cup Lingual/palatal residue Oral - Honey Teaspoon -- Oral - Honey Cup -- Oral - Nectar Teaspoon NT Oral - Nectar Cup NT Oral - Nectar Straw NT Oral - Thin Teaspoon Lingual/palatal residue Oral - Thin Cup Lingual/palatal residue;Impaired mastication Oral - Thin Straw Lingual/palatal residue Oral - Puree Lingual/palatal residue Oral -  Mech Soft Lingual/palatal residue Oral - Regular -- Oral - Multi-Consistency -- Oral - Pill -- Oral Phase - Comment --  CHL IP PHARYNGEAL PHASE 09/15/2018 Pharyngeal Phase Impaired Pharyngeal- Pudding Teaspoon Delayed swallow initiation-vallecula;Pharyngeal residue - valleculae;Pharyngeal residue - pyriform Pharyngeal -- Pharyngeal- Pudding Cup NT Pharyngeal -- Pharyngeal- Honey Teaspoon -- Pharyngeal -- Pharyngeal- Honey Cup -- Pharyngeal -- Pharyngeal- Nectar Teaspoon NT Pharyngeal -- Pharyngeal- Nectar Cup NT Pharyngeal -- Pharyngeal- Nectar Straw NT Pharyngeal -- Pharyngeal- Thin Teaspoon Delayed swallow initiation-pyriform sinuses;Reduced pharyngeal peristalsis;Reduced anterior laryngeal mobility;Reduced laryngeal elevation;Reduced airway/laryngeal closure;Reduced tongue base retraction;Pharyngeal residue - valleculae;Pharyngeal residue - pyriform Pharyngeal -- Pharyngeal- Thin Cup Delayed swallow initiation-pyriform sinuses;Reduced anterior laryngeal mobility;Reduced laryngeal elevation;Reduced airway/laryngeal closure;Reduced tongue base retraction;Pharyngeal residue - valleculae;Pharyngeal residue - pyriform;Reduced pharyngeal peristalsis Pharyngeal -- Pharyngeal- Thin Straw Delayed swallow initiation-pyriform sinuses;Reduced anterior laryngeal mobility;Reduced laryngeal elevation;Reduced airway/laryngeal closure;Reduced pharyngeal peristalsis;Reduced tongue base retraction;Pharyngeal residue - valleculae;Pharyngeal residue - pyriform;Penetration/Aspiration during swallow Pharyngeal -- Pharyngeal- Puree Delayed swallow initiation-vallecula;Reduced laryngeal elevation;Reduced airway/laryngeal closure;Reduced tongue base retraction;Pharyngeal residue - valleculae Pharyngeal -- Pharyngeal- Mechanical Soft Delayed swallow initiation-vallecula;Reduced laryngeal elevation;Reduced airway/laryngeal closure;Reduced tongue base retraction Pharyngeal -- Pharyngeal- Regular NT Pharyngeal -- Pharyngeal- Multi-consistency  -- Pharyngeal -- Pharyngeal- Pill Delayed swallow initiation-vallecula;Reduced laryngeal elevation;Reduced airway/laryngeal closure Pharyngeal -- Pharyngeal Comment --  CHL IP CERVICAL ESOPHAGEAL PHASE 09/15/2018 Cervical Esophageal Phase WFL Pudding Teaspoon -- Pudding Cup -- Honey Teaspoon -- Honey Cup -- Nectar Teaspoon -- Nectar Cup -- Nectar Straw -- Thin Teaspoon -- Thin Cup -- Thin Straw -- Puree -- Mechanical Soft -- Regular -- Multi-consistency -- Pill -- Cervical Esophageal Comment -- Charlynne Cousins Ward, MA, CCC-SLP 09/15/2018 2:59 PM              Dg Swallowing Func-speech Pathology  Result Date: 09/12/2018 Objective Swallowing Evaluation: Type of Study: MBS-Modified Barium Swallow Study  Patient Details Name: YANETTE TRIPOLI MRN: [id number] Date of Birth: 03/27/1946 Today's Date: 09/12/2018 Time: SLP Start Time (ACUTE ONLY): 1300 -SLP Stop  Time (ACUTE ONLY): 1337 SLP Time Calculation (min) (ACUTE ONLY): 37 min Past Medical History: Past Medical History: Diagnosis Date . Arthritis  . Hiatal hernia  . History of chicken pox  . Hyperlipidemia  . Pacemaker  Past Surgical History: Past Surgical History: Procedure Laterality Date . ANKLE SURGERY   . PACEMAKER IMPLANT N/A 04/18/2018  Procedure: PACEMAKER IMPLANT;  Surgeon: Evans Lance, MD;  Location: [place] [facility];  Service: Cardiovascular;  Laterality: N/A; HPI: NAYELI CALVERT is a 72 y.o. 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  During her hospitalization she underwent left axillary lymph node biopsy.  She was discharged home in a stable condition, but apparently she has been developing worsening weakness, generalized, worse with exertion, no improving factors, associated with malaise, chills and poor appetite.  Over the last 24 hours her symptoms have been more severe to the point where she has difficulty  standing. She has persistent left upper extremity edema which has been painful, sharp in nature up to 10 out of 10 intensity.  Her weakness has led to dyspnea which has been severe in intensity, and triggered her emergency room visit today.Pt was made NPO yesterday due to concerns for aspiration.   Today pt is alert, has congested weak cough concerrning for airway protection.  Subjective: The patient was seen sitting upright in bed with her daughter at the bedside.  Assessment / Plan / Recommendation CHL IP CLINICAL IMPRESSIONS 09/12/2018 Clinical Impression Pt presents with moderately severe oropharyngeal dysphagia with sensorimotor deficits.  Decreased oral propulsion and weak pharyngeal motility results in significant pharyngeal more than oral residuals.  Pt does NOT sense residuals and is unable to "hock" to expectorate to clear them.  Chin tuck, head turn postures did NOT decrease residuals.  Pt did aspirate with thin liquids posterior trachea with delayed cough response.  Although she did not aspirate with thicker consistencies, increased residuals present which if aspiration post-swallow are more difficult for pulmonary clearance.  Pt is grossly weak at this time - uncertain of her baseline swallow function.  Pt is a high aspiration/malnutrition risk due to level of dysphagia and weak nonproductive cough.  If she is to consume po intake= it should be with accepted risks and mitigation strategies.  Question if pt's swallow with improve with improved medical status. SLP Visit Diagnosis Dysphagia, oropharyngeal phase (R13.12) Attention and concentration deficit following -- Frontal lobe and executive function deficit following -- Impact on safety and function Severe aspiration risk;Risk for inadequate nutrition/hydration   CHL IP TREATMENT RECOMMENDATION 09/11/2018 Treatment Recommendations Therapy as outlined in treatment plan below   Prognosis 09/12/2018 Prognosis for Safe Diet Advancement Fair Barriers to Reach  Goals Severity of deficits;Cognitive deficits;Other (Comment) Barriers/Prognosis Comment -- CHL IP DIET RECOMMENDATION 09/12/2018 SLP Diet Recommendations (No Data) Liquid Administration via Cup Medication Administration Crushed with puree Compensations Slow rate;Small sips/bites;Follow solids with liquid Postural Changes Remain semi-upright after after feeds/meals (Comment);Seated upright at 90 degrees   CHL IP OTHER RECOMMENDATIONS 09/12/2018 Recommended Consults -- Oral Care Recommendations Oral care before and after PO Other Recommendations Have oral suction available   CHL IP FOLLOW UP RECOMMENDATIONS 09/12/2018 Follow up Recommendations (No Data)   CHL IP FREQUENCY AND DURATION 09/12/2018 Speech Therapy Frequency (ACUTE ONLY) min 2x/week Treatment Duration 1 week      CHL IP ORAL PHASE 09/12/2018 Oral Phase Impaired Oral - Pudding  Teaspoon -- Oral - Pudding Cup -- Oral - Honey Teaspoon -- Oral - Honey Cup -- Oral - Nectar Teaspoon Weak lingual manipulation;Reduced posterior propulsion;Lingual/palatal residue Oral - Nectar Cup Weak lingual manipulation;Reduced posterior propulsion;Lingual/palatal residue Oral - Nectar Straw Weak lingual manipulation;Reduced posterior propulsion;Lingual/palatal residue Oral - Thin Teaspoon Weak lingual manipulation;Reduced posterior propulsion Oral - Thin Cup Weak lingual manipulation;Reduced posterior propulsion Oral - Thin Straw Reduced posterior propulsion Oral - Puree Weak lingual manipulation;Reduced posterior propulsion Oral - Mech Soft Weak lingual manipulation;Reduced posterior propulsion Oral - Regular -- Oral - Multi-Consistency -- Oral - Pill -- Oral Phase - Comment --  CHL IP PHARYNGEAL PHASE 09/12/2018 Pharyngeal Phase Impaired Pharyngeal- Pudding Teaspoon -- Pharyngeal -- Pharyngeal- Pudding Cup -- Pharyngeal -- Pharyngeal- Honey Teaspoon -- Pharyngeal -- Pharyngeal- Honey Cup -- Pharyngeal -- Pharyngeal- Nectar Teaspoon Reduced pharyngeal peristalsis;Reduced epiglottic  inversion;Reduced tongue base retraction;Pharyngeal residue - valleculae;Pharyngeal residue - pyriform Pharyngeal -- Pharyngeal- Nectar Cup Reduced pharyngeal peristalsis;Reduced epiglottic inversion;Reduced laryngeal elevation;Reduced airway/laryngeal closure;Reduced tongue base retraction;Pharyngeal residue - valleculae;Pharyngeal residue - pyriform;Penetration/Aspiration during swallow Pharyngeal Material enters airway, remains ABOVE vocal cords and not ejected out Pharyngeal- Nectar Straw Reduced pharyngeal peristalsis;Reduced epiglottic inversion;Reduced anterior laryngeal mobility;Reduced airway/laryngeal closure;Reduced tongue base retraction;Reduced laryngeal elevation;Pharyngeal residue - valleculae;Pharyngeal residue - pyriform Pharyngeal -- Pharyngeal- Thin Teaspoon Reduced pharyngeal peristalsis;Reduced epiglottic inversion;Reduced laryngeal elevation;Reduced airway/laryngeal closure;Reduced tongue base retraction;Pharyngeal residue - valleculae;Pharyngeal residue - pyriform Pharyngeal -- Pharyngeal- Thin Cup Reduced pharyngeal peristalsis;Reduced epiglottic inversion;Reduced laryngeal elevation;Reduced airway/laryngeal closure;Reduced tongue base retraction;Pharyngeal residue - valleculae;Pharyngeal residue - pyriform;Penetration/Aspiration during swallow Pharyngeal Material enters airway, passes BELOW cords without attempt by patient to eject out (silent aspiration) Pharyngeal- Thin Straw Reduced pharyngeal peristalsis;Reduced epiglottic inversion;Reduced laryngeal elevation;Reduced airway/laryngeal closure;Reduced tongue base retraction;Pharyngeal residue - valleculae;Pharyngeal residue - pyriform Pharyngeal Material enters airway, passes BELOW cords and not ejected out despite cough attempt by patient Pharyngeal- Puree Reduced epiglottic inversion;Reduced pharyngeal peristalsis;Reduced tongue base retraction;Reduced airway/laryngeal closure;Reduced laryngeal elevation;Pharyngeal residue -  valleculae;Pharyngeal residue - pyriform Pharyngeal -- Pharyngeal- Mechanical Soft Reduced pharyngeal peristalsis;Reduced epiglottic inversion;Reduced tongue base retraction;Pharyngeal residue - valleculae;Reduced airway/laryngeal closure Pharyngeal -- Pharyngeal- Regular -- Pharyngeal -- Pharyngeal- Multi-consistency -- Pharyngeal -- Pharyngeal- Pill -- Pharyngeal -- Pharyngeal Comment --  CHL IP CERVICAL ESOPHAGEAL PHASE 09/12/2018 Cervical Esophageal Phase Impaired Pudding Teaspoon -- Pudding Cup -- Honey Teaspoon -- Honey Cup -- Nectar Teaspoon -- Nectar Cup -- Nectar Straw -- Thin Teaspoon -- Thin Cup -- Thin Straw -- Puree -- Mechanical Soft -- Regular -- Multi-consistency -- Pill -- Cervical Esophageal Comment upon esophageal sweep, pt appeared clear Luanna Salk, MS [facility] SLP Acute Rehab Services Pager [phone] Office [phone] Macario Golds 09/12/2018, 5:29 PM              [place] Core Biopsy (lymph Nodes)  Result Date: 08/30/2018 INDICATION: 72 year old with scattered lung lesions, left arm swelling and extensive left axillary lymphadenopathy. Findings are concerning for metastatic disease and tissue diagnosis is needed. EXAM: ULTRASOUND-GUIDED CORE BIOPSY OF LEFT AXILLARY LYMPH NODE MEDICATIONS: None. ANESTHESIA/SEDATION: Moderate (conscious) sedation was employed during this procedure. A total of Versed 0.5 mg and Fentanyl 50 mcg was administered intravenously. Moderate Sedation Time: 15 minutes. The patient's level of consciousness and vital signs were monitored continuously by radiology nursing throughout the procedure under my direct supervision. FLUOROSCOPY TIME:  None COMPLICATIONS: None immediate. PROCEDURE: Informed written consent was obtained from the patient after a thorough discussion of the procedural risks, benefits and alternatives. All questions were addressed. A timeout was performed prior to  the initiation of the procedure. Ultrasound demonstrated enlarged left axillary lymph  nodes. The left axilla was prepped and draped in sterile fashion. Skin was anesthetized with 1% lidocaine. 18 gauge core biopsies were obtained of a round enlarged lymph node with ultrasound guidance. Total of 6 core biopsies were obtained. Specimens placed in saline. Bandage placed over the puncture site. FINDINGS: Multiple enlarged hypoechoic lymph nodes in left axilla. Biopsy needle confirmed within the targeted lymph node. No bleeding or hematoma formation at end of procedure. IMPRESSION: Ultrasound-guided core biopsy of an enlarged left axillary lymph node. Electronically Signed   By: Markus Daft M.D.   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 Limitations: Body habitus, swelling and poor ultrasound/tissue interface. Comparison Study: No prior study on file for comparison. Performing Technologist: Sharion Dove RVS  Examination Guidelines: A complete evaluation includes B-mode imaging, spectral Doppler, color Doppler, and power Doppler as needed of all accessible portions of each vessel. Bilateral testing is considered an integral part of a complete examination. Limited examinations for reoccurring indications may be performed as noted.  Right Findings: +----------+------------+----------+---------+-----------+-------+ RIGHT     CompressiblePropertiesPhasicitySpontaneousSummary +----------+------------+----------+---------+-----------+-------+ Subclavian                         Yes       Yes            +----------+------------+----------+---------+-----------+-------+  Left Findings: +----------+------------+----------+---------+-----------+-------+ LEFT      CompressiblePropertiesPhasicitySpontaneousSummary +----------+------------+----------+---------+-----------+-------+ IJV                                Yes       Yes             +----------+------------+----------+---------+-----------+-------+ Subclavian                         Yes       Yes            +----------+------------+----------+---------+-----------+-------+ Axillary      Full                                          +----------+------------+----------+---------+-----------+-------+ Brachial      Full                                          +----------+------------+----------+---------+-----------+-------+ Cephalic      Full                                          +----------+------------+----------+---------+-----------+-------+ Basilic       Full                                          +----------+------------+----------+---------+-----------+-------+  Summary:  Right: No evidence of thrombosis in the subclavian.  Left: No evidence of deep vein thrombosis in the upper extremity. However, unable to  visualize the radial and ulnar. No evidence of superficial vein thrombosis in the upper extremity. However, unable to visualize the radial and ulnar. No evidence of thrombosis in the . However, unable to visualize the radial and ulnar. This was a limited study.  *See table(s) above for measurements and observations.  Diagnosing physician: Deitra Mayo MD Electronically signed by Deitra Mayo MD on 08/29/2018 at 4:20:56 PM.    Final    Ir Picc Placement Right >5 Yrs Inc Img Guide  Result Date: 09/09/2018 INDICATION: Acute lymphoma, access for chemotherapy as an inpatient EXAM: ULTRASOUND AND FLUOROSCOPIC GUIDED PICC LINE INSERTION MEDICATIONS: 1% lidocaine local CONTRAST:  None FLUOROSCOPY TIME:  Twenty-four seconds (8 mGy) COMPLICATIONS: None immediate. TECHNIQUE: The procedure, risks, benefits, and alternatives were explained to the patient and informed written consent was obtained. A timeout was performed prior to the initiation of the procedure. The right upper extremity was prepped with chlorhexidine in a sterile  fashion, and a sterile drape was applied covering the operative field. Maximum barrier sterile technique with sterile gowns and gloves were used for the procedure. A timeout was performed prior to the initiation of the procedure. Local anesthesia was provided with 1% lidocaine. Under direct ultrasound guidance, the right brachial vein was accessed with a micropuncture kit after the overlying soft tissues were anesthetized with 1% lidocaine. An ultrasound image was saved for documentation purposes. A guidewire was advanced to the level of the superior caval-atrial junction for measurement purposes and the PICC line was cut to length. A peel-away sheath was placed and a 36 cm, 5 [place], dual lumen was inserted to level of the superior caval-atrial junction. A post procedure spot fluoroscopic was obtained. The catheter easily aspirated and flushed and was sutured in place. A dressing was placed. The patient tolerated the procedure well without immediate post procedural complication. FINDINGS: After catheter placement, the tip lies within the superior cavoatrial junction. The catheter aspirates and flushes normally and is ready for immediate use. IMPRESSION: Successful ultrasound and fluoroscopic guided placement of a right brachial vein approach, 36 cm, 5 French, dual lumen PICC with tip at the superior caval-atrial junction. The PICC line is ready for immediate use. Electronically Signed   By: Jerilynn Mages.  Shick M.D.   On: 09/09/2018 09:23   [place] Ekg Site Rite  Result Date: 09/08/2018 If Site Rite image not attached, placement could not be confirmed due to current cardiac rhythm.    CBC Recent Labs  Lab 09/17/18 0600 09/18/18 0422 09/19/18 0401 09/20/18 0450 09/21/18 0607 09/22/18 0546 09/23/18 0950  WBC 1.6* 0.6* 0.1* 0.1* 0.4* 2.2* 12.7*  HGB 8.7* 8.9* 8.8* 8.2* 8.0* 7.0* 10.5*  HCT 26.7* 26.8* 26.7* 25.2* 24.5* 22.1* 31.9*  PLT 54* 59* 60* 64* 111* 129* 239  MCV 88.1 86.5 87.5 88.7 87.5 89.5 87.6  MCH  28.7 28.7 28.9 28.9 28.6 28.3 28.8  MCHC 32.6 33.2 33.0 32.5 32.7 31.7 32.9  RDW 16.3* 16.1* 15.9* 15.9* 16.1* 15.9* 15.0  LYMPHSABS 0.0* 0.0*  --   --  0.0* 0.0* 0.1*  MONOABS 0.0* 0.0*  --   --  0.2 0.5 0.9  EOSABS 0.0 0.0  --   --  0.0 0.0 0.0  BASOSABS 0.0 0.0  --   --  0.0 0.0 0.1    Chemistries  Recent Labs  Lab 09/18/18 0422 09/19/18 0401 09/20/18 0450 09/21/18 0607 09/22/18 0546  NA 141 140 135 133* 137  K 3.1* 3.6 3.8 3.5 3.1*  CL 99 98 98  94* 106  CO2 34* 35* 29 29 27   GLUCOSE 105* 110* 110* 96 89  BUN 17 15 13 13 16   CREATININE 0.38* 0.40* 0.36* 0.46 0.41*  CALCIUM 8.9 8.7* 8.4* 7.7* 7.9*  MG 1.8 2.1  --   --   --   AST  --  23 16  --  9*  ALT  --  20 18  --  14  ALKPHOS  --  61 61  --  57  BILITOT  --  2.1* 2.1*  --  1.9*    Lab Results  Component Value Date   HGBA1C 5.3 09/13/2012    Roxan Hockey M.D on 09/23/2018 at 6:07 PM  Pager---[phone] Go to [email or web address] - password [email or web address] for contact info  [facility] - Office  [phone]

## 2018-09-23 NOTE — Progress Notes (Signed)
Pharmacy Antibiotic Note  Patricia Murillo is a 72 y.o. female admitted on 09/07/2018 with febrile neutropenia.  Pharmacy has been consulted for cefepime dosing. Patient now with pseudomonas bacteremia awaiting final sensitivities  Today, 09/23/18  Day 3 Cefepime  Afebrile  WBC improved on 12/19, awaiting CBC for 12/20  SCr stable  Plan: Continue Cefepime 2g IV q8 Monitor clinical course, renal function, cultures as available   Height: 5\' 6"  (167.6 cm) Weight: 238 lb (108 kg) IBW/kg (Calculated) : 59.3  Temp (24hrs), Avg:97.9 F (36.6 C), Min:97.8 F (36.6 C), Max:98.3 F (36.8 C)  Recent Labs  Lab 09/18/18 0422 09/19/18 0401 09/20/18 0450 09/21/18 0607 09/22/18 0546  WBC 0.6* 0.1* 0.1* 0.4* 2.2*  CREATININE 0.38* 0.40* 0.36* 0.46 0.41*    Estimated Creatinine Clearance: 80.2 mL/min (A) (by C-G formula based on SCr of 0.41 mg/dL (L)).    Allergies  Allergen Reactions  . Apixaban Other (See Comments)    Rectal bleeding within 2 days of taking medication    Antimicrobials this admission: 12/4 CTX >> 12/8 12/4 flagyl >> 12/5 12/7 acyclovir for HSV >> 12/9 Unasyn >> 12/12 12/18 cefepime >>   Dose adjustments this admission:    Microbiology results: 12/4 BCx x2: NGF 12/4 UCx: neg FINAL 12/18 BCx: 1 of 2 with pseudomonas 12/18 UCx: 20k pseudomonas   Thank you for allowing pharmacy to be a part of this patient's care.   Adrian Saran, PharmD, BCPS Pager [phone] 09/23/2018 8:26 AM

## 2018-09-24 LAB — BPAM RBC
Blood Product Expiration Date: 202001152359
Blood Product Expiration Date: 202001152359
ISSUE DATE / TIME: 201912192359
ISSUE DATE / TIME: 201912200333
Unit Type and Rh: 5100
Unit Type and Rh: 5100

## 2018-09-24 LAB — CBC WITH DIFFERENTIAL/PLATELET
Abs Immature Granulocytes: 1.2 10*3/uL — ABNORMAL HIGH (ref 0.00–0.07)
Band Neutrophils: 9 %
Basophils Absolute: 0 10*3/uL (ref 0.0–0.1)
Basophils Relative: 0 %
Eosinophils Absolute: 0 10*3/uL (ref 0.0–0.5)
Eosinophils Relative: 0 %
HCT: 33.9 % — ABNORMAL LOW (ref 36.0–46.0)
Hemoglobin: 11 g/dL — ABNORMAL LOW (ref 12.0–15.0)
Lymphocytes Relative: 2 %
Lymphs Abs: 0.3 10*3/uL — ABNORMAL LOW (ref 0.7–4.0)
MCH: 28.6 pg (ref 26.0–34.0)
MCHC: 32.4 g/dL (ref 30.0–36.0)
MCV: 88.1 fL (ref 80.0–100.0)
Metamyelocytes Relative: 5 %
Monocytes Absolute: 1.3 10*3/uL — ABNORMAL HIGH (ref 0.1–1.0)
Monocytes Relative: 10 %
Myelocytes: 4 %
NEUTROS ABS: 10.2 10*3/uL — AB (ref 1.7–7.7)
NRBC: 0 % (ref 0.0–0.2)
Neutrophils Relative %: 70 %
Platelets: 354 10*3/uL (ref 150–400)
RBC: 3.85 MIL/uL — ABNORMAL LOW (ref 3.87–5.11)
RDW: 15.7 % — ABNORMAL HIGH (ref 11.5–15.5)
WBC: 12.9 10*3/uL — ABNORMAL HIGH (ref 4.0–10.5)

## 2018-09-24 LAB — URINE CULTURE

## 2018-09-24 LAB — TYPE AND SCREEN
ABO/RH(D): O POS
Antibody Screen: NEGATIVE
Unit division: 0
Unit division: 0

## 2018-09-24 LAB — CULTURE, BLOOD (ROUTINE X 2): Special Requests: ADEQUATE

## 2018-09-24 MED ORDER — LEVOFLOXACIN IN D5W 750 MG/150ML IV SOLN
750.0000 mg | INTRAVENOUS | Status: AC
  Administered 2018-09-24 – 2018-09-28 (×5): 750 mg via INTRAVENOUS
  Filled 2018-09-24 (×5): qty 150

## 2018-09-24 MED ORDER — PANTOPRAZOLE SODIUM 40 MG PO TBEC
40.0000 mg | DELAYED_RELEASE_TABLET | Freq: Two times a day (BID) | ORAL | Status: DC
  Administered 2018-09-24 – 2018-10-05 (×22): 40 mg via ORAL
  Filled 2018-09-24 (×22): qty 1

## 2018-09-24 NOTE — Progress Notes (Signed)
Patient Demographics:    Patricia Murillo, is a 72 y.o. female, DOB - 1945-11-27, AQT:[id number]  Admit date - 09/07/2018   Admitting Physician Mauricio Gerome Apley, MD  Outpatient Primary MD for the patient is Patient, No Pcp Per  LOS - 28   Chief Complaint  Patient presents with  . 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 with newly diagnosed high-grade B-cell lymphoma, recently discharged from hospital after biopsy, presented to the emergency room with progressive weakness, dyspnea, chills and fever, anorexia, and extremity edema, found to have  Pseudomonas bacteremia, as well as enterococcus and Pseudomonas UTI, currently on IV Levaquin , awaiting insurance approval for  transfer to SNF rehab   PLan:-  1) Neutropenic fevers/pseudomonas aeruginosa bacteremia/Pseudomonas UTI--clinically much improved, currently afebrile, WBC  12.9, Granix discontinued ,  blood cultures from 09/21/2018 with pseudomonas aeruginosa, urine culture also with Pseudomonas aeruginosa AND enterococcus "faecalis,, chest x-ray without acute findings, treated with IV cefepime started 09/21/2018 thru 09/24/18, verbal consultation on 09/22/18 with Dr Talbot Grumbling and [facility] Pharmacist , verbal consultation with Dr Michel Bickers on 09/24/18, stopped  cefepime, start IV Levaquin 750 daily for 5 days starting on 09/24/2018 to cover for both Pseudomonas and enterococcus based on urine culture  2)Diffuse large B-cell lymphoma, activated B-cell type--- per Dr Burr Medico [facility] should be continued once acute issues resolve  3)Thrombocytopenia--due to lymphoma and chemo , thrombocytopenia has resolved,, platelets now > 354k  4)Autoimmune hemolytic anemia, 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 Burr Medico  Appreciated  5) hypercalcemia secondary to malignancy- s/p Zometa  6)Acute hypoxic respiratory failure. Likely related to progressive high-grade B-cell lymphoma with pulmonary involvement comp located by suspected aspiration--- respiratory status appears back to baseline--- continue bronchodilators, IV Levaquin as above #1  7) acute toxic and metabolic encephalopathy----mentation is back to baseline as per family, multi-factorial including malignancy, hypercalcemia and obviously infection,   8) moderate protein caloric malnutrition----due to underlying malignancy and poor oral intake, patient has low albumin and edema--- nutritional supplements advised, c/n Megace for appetite stimulation  9) significant left upper extremity swelling and weakness----due to lymphatic obstruction by high-grade lymphoma, venous Dopplers negative of the DVT on 08/29/2018  10)Pupil Discrepancy -Noted to be new -No Vision loss -Head CT Negative -Continue to Monitor   11)unstageable pressure injury in the sacrococcygeal area- 3.5cm x 1.5cm x 0cm --- please see wound care recommendations  from ostomy RN dated 09/22/2018  Disposition/Need for in-Hospital Stay- patient unable to be discharged at this time due to Pseudomonas bacteremia and UTI in an immunocompromised patient requiring  IV antibiotics, also awaiting insurance approval for SNF rehab   Code Status : FULL    Disposition Plan  : TBD  Consults  :  oncology   DVT Prophylaxis  :  TEDs - SCDs   Lab Results  Component Value Date   PLT 354 09/24/2018    Inpatient Medications  Scheduled Meds: . acyclovir  400 mg Oral BID  . allopurinol  300 mg Oral Daily  . cholecalciferol  2,000 Units Oral Daily  . collagenase   Topical Daily  . famotidine  20 mg Oral Daily  . feeding supplement (ENSURE ENLIVE)  237 mL Oral BID BM  . fluticasone  2 spray Each Nare Daily  . folic acid  1 mg Oral Daily  . levalbuterol  0.63 mg Nebulization TID  . loratadine  10 mg Oral Daily  . mouth rinse  15 mL Mouth Rinse BID  . megestrol  400 mg Oral Daily  . mupirocin ointment   Topical BID  . pantoprazole  40 mg Oral BID  . polyethylene glycol  17 g Oral Daily  . predniSONE  20 mg Oral Q breakfast  . senna-docusate  1 tablet Oral BID   Continuous Infusions: . sodium chloride 250 mL (09/23/18 2022)  . sodium chloride    . levofloxacin (LEVAQUIN) IV 750 mg (09/24/18 1500)   PRN Meds:.sodium chloride, sodium chloride, acetaminophen **OR** [DISCONTINUED] acetaminophen, alteplase, alum & mag hydroxide-simeth **AND** lidocaine, benzonatate, guaiFENesin-dextromethorphan, HYDROcodone-acetaminophen, lidocaine, lip balm, meclizine, ondansetron **OR** ondansetron (ZOFRAN) IV, sodium chloride flush    Anti-infectives (From admission, onward)   Start     Dose/Rate Route Frequency Ordered Stop   09/24/18 1500  levofloxacin (LEVAQUIN) IVPB 750 mg     750 mg 100 mL/hr over 90 Minutes Intravenous Every 24 hours 09/24/18 1023 09/29/18 1459   09/21/18 1200  ceFEPIme (MAXIPIME) 2 g in sodium chloride 0.9 % 100 mL IVPB     2 g 200 mL/hr over 30 Minutes Intravenous Every 8 hours 09/21/18 1027 09/24/18 1230   09/12/18 1600  ampicillin-sulbactam (UNASYN) 1.5 g in sodium chloride 0.9 % 100 mL IVPB  Status:  Discontinued     1.5 g 200  mL/hr over 30 Minutes Intravenous Every 6 hours 09/12/18 1408 09/15/18 0753   09/10/18 1000  acyclovir (ZOVIRAX) 200 MG capsule 400 mg     400 mg Oral 2 times daily 09/10/18 0754     09/07/18 2000  cefTRIAXone (ROCEPHIN) 1 g in sodium chloride 0.9 % 100 mL IVPB  Status:  Discontinued     1 g 200 mL/hr over 30 Minutes Intravenous Every 24 hours 09/07/18 1844 09/12/18 0940   09/07/18 0830  ceFEPIme (MAXIPIME) 2 g in sodium chloride 0.9 % 100 mL IVPB     2 g 200 mL/hr over 30 Minutes Intravenous  Once 09/07/18 0820 09/07/18 0912   09/07/18 0830  metroNIDAZOLE (FLAGYL) IVPB 500 mg  Status:  Discontinued     500 mg 100 mL/hr over 60 Minutes Intravenous Every 8 hours 09/07/18 0820 09/08/18 1812   09/07/18 0830  vancomycin (VANCOCIN) IVPB 1000 mg/200 mL premix     1,000 mg 200 mL/hr over 60 Minutes Intravenous  Once 09/07/18 0820 09/07/18 1157        Objective:   Vitals:   09/24/18 0559 09/24/18 0925 09/24/18  1411 09/24/18 1530  BP: (!) 189/88   (!) 181/81  Pulse: 85   93  Resp: 17   18  Temp: 98.1 F (36.7 C)     TempSrc: Oral     SpO2: 96% 96% 93% 93%  Weight:      Height:        Wt Readings from Last 3 Encounters:  09/22/18 108 kg  08/30/18 90.2 kg  08/04/18 83.5 kg     Intake/Output Summary (Last 24 hours) at 09/24/2018 1839 Last data filed at 09/24/2018 1700 Gross per 24 hour  Intake 450 ml  Output 1 ml  Net 449 ml     Physical Exam Patient is examined daily including today on 09/24/18 , exams remain the same as of yesterday except that has changed   Gen:- Awake,  Alert,  In no apparent distress  HEENT:- [place].AT, No sclera icterus Neck-Supple Neck,No JVD,.  Lungs-  CTAB , fair symmetrical air movement CV- S1, S2 normal, regular  Abd-  +ve B.Sounds, Abd Soft, No tenderness,    Extremity:- pedal pulses present, improving edema of left upper extremity, bilateral lower extremity edema improving slowly, right upper extremity PICC line site w/o evidence of inflammation  or infection Psych-affect is appropriate, and is coherent, oriented x3 Neuro-no new focal deficits, no tremors Skin-unstageable pressure injury in the sacrococcygeal area- 3.5cm x 1.5cm x 0cm --- please see photo below Media Information   Document Information   Photos    09/23/2018 12:28  Attached To:  Hospital Encounter on 09/07/18  Source Information   Roxan Hockey, MD  [facility] Oncology     Data Review:   Micro Results Recent Results (from the past 240 hour(s))  Culture, blood (routine x 2)     Status: Abnormal   Collection Time: 09/21/18  8:13 AM  Result Value Ref Range Status   Specimen Description   Final    BLOOD RIGHT ANTECUBITAL Performed at [facility], [address], [place], [place] [address]    Special Requests   Final    BOTTLES DRAWN AEROBIC AND ANAEROBIC Blood Culture adequate volume Performed at [place] [address], [place], [place] [address]    Culture  Setup Time   Final    GRAM NEGATIVE RODS AEROBIC BOTTLE ONLY CRITICAL RESULT CALLED TO, READ BACK BY AND VERIFIED WITHLavell Luster [facility] 7169 09/22/18 A BROWNING Performed at [facility], [place] [address], [place], [place] [address]    Culture PSEUDOMONAS AERUGINOSA (A)  Final   Report Status 09/24/2018 FINAL  Final   Organism ID, Bacteria PSEUDOMONAS AERUGINOSA  Final      Susceptibility   Pseudomonas aeruginosa - MIC*    CEFTAZIDIME 4 SENSITIVE Sensitive     CIPROFLOXACIN <=0.25 SENSITIVE Sensitive     GENTAMICIN <=1 SENSITIVE Sensitive     IMIPENEM 2 SENSITIVE Sensitive     PIP/TAZO <=4 SENSITIVE Sensitive     CEFEPIME 4 SENSITIVE Sensitive     * PSEUDOMONAS AERUGINOSA  Blood Culture ID Panel (Reflexed)     Status: Abnormal   Collection Time: 09/21/18  8:13 AM  Result Value Ref Range Status   Enterococcus species NOT DETECTED NOT DETECTED Final   Listeria monocytogenes NOT DETECTED NOT DETECTED Final   Staphylococcus species NOT DETECTED NOT  DETECTED Final   Staphylococcus aureus (BCID) NOT DETECTED NOT DETECTED Final   Streptococcus species NOT DETECTED NOT DETECTED Final   Streptococcus agalactiae NOT DETECTED NOT  DETECTED Final   Streptococcus pneumoniae NOT DETECTED NOT DETECTED Final   Streptococcus pyogenes NOT DETECTED NOT DETECTED Final   Acinetobacter baumannii NOT DETECTED NOT DETECTED Final   Enterobacteriaceae species NOT DETECTED NOT DETECTED Final   Enterobacter cloacae complex NOT DETECTED NOT DETECTED Final   Escherichia coli NOT DETECTED NOT DETECTED Final   Klebsiella oxytoca NOT DETECTED NOT DETECTED Final   Klebsiella pneumoniae NOT DETECTED NOT DETECTED Final   Proteus species NOT DETECTED NOT DETECTED Final   Serratia marcescens NOT DETECTED NOT DETECTED Final   Carbapenem resistance NOT DETECTED NOT DETECTED Final   Haemophilus influenzae NOT DETECTED NOT DETECTED Final   Neisseria meningitidis NOT DETECTED NOT DETECTED Final   Pseudomonas aeruginosa DETECTED (A) NOT DETECTED Final    Comment: CRITICAL RESULT CALLED TO, READ BACK BY AND VERIFIED WITHLavell Luster [facility] 4008 09/22/18 A BROWNING    Candida albicans NOT DETECTED NOT DETECTED Final   Candida glabrata NOT DETECTED NOT DETECTED Final   Candida krusei NOT DETECTED NOT DETECTED Final   Candida parapsilosis NOT DETECTED NOT DETECTED Final   Candida tropicalis NOT DETECTED NOT DETECTED Final    Comment: Performed at [facility], [place] [address], [place], [place] [address]  Culture, blood (routine x 2)     Status: None (Preliminary result)   Collection Time: 09/21/18  8:56 AM  Result Value Ref Range Status   Specimen Description   Final    BLOOD RIGHT HAND Performed at [facility] [address], [place], [place] [address]    Special Requests   Final    BOTTLES DRAWN AEROBIC AND ANAEROBIC Blood Culture results may not be optimal due to an inadequate volume of blood received in culture bottles Performed at [facility] [address] [place], [place] [address]    Culture   Final    NO GROWTH 3 DAYS Performed at [facility], [place] [address] [place], [place] [address]    Report Status PENDING  Incomplete  Urine Culture     Status: Abnormal   Collection Time: 09/21/18  3:43 PM  Result Value Ref Range Status   Specimen Description   Final    URINE, CLEAN CATCH Performed at [facility], [place] [address], [place], [place] [address]    Special Requests   Final    Immunocompromised Performed at [facility], [place] [address] [place], [place] [address]    Culture (A)  Final    20,000 COLONIES/mL PSEUDOMONAS AERUGINOSA 20,000 COLONIES/mL ENTEROCOCCUS FAECALIS    Report Status 09/24/2018 FINAL  Final   Organism ID, Bacteria PSEUDOMONAS AERUGINOSA (A)  Final   Organism ID, Bacteria ENTEROCOCCUS FAECALIS (A)  Final      Susceptibility   Enterococcus faecalis - MIC*    AMPICILLIN <=2 SENSITIVE Sensitive     LEVOFLOXACIN 1 SENSITIVE Sensitive     NITROFURANTOIN <=16 SENSITIVE Sensitive     VANCOMYCIN 1 SENSITIVE Sensitive     * 20,000 COLONIES/mL ENTEROCOCCUS FAECALIS   Pseudomonas aeruginosa - MIC*    CEFTAZIDIME 2 SENSITIVE Sensitive     CIPROFLOXACIN <=0.25 SENSITIVE Sensitive     GENTAMICIN <=1 SENSITIVE Sensitive     IMIPENEM 2 SENSITIVE Sensitive     PIP/TAZO 8 SENSITIVE Sensitive     CEFEPIME 2 SENSITIVE Sensitive     * 20,000 COLONIES/mL PSEUDOMONAS AERUGINOSA  Culture, blood (Routine X 2) w Reflex to ID Panel  Status: None (Preliminary result)   Collection Time: 09/23/18  6:17 PM  Result Value Ref Range Status   Specimen Description   Final    BLOOD RIGHT ARM Performed at [facility] [address] [place], [place] [address]    Special Requests   Final    BOTTLES DRAWN AEROBIC AND ANAEROBIC Blood Culture adequate volume Performed at [facility] [address]  [place], [place] [address]    Culture   Final    NO GROWTH < 12 HOURS Performed at [facility] [address] [place],  [address]    Report Status PENDING  Incomplete    Radiology Reports Dg Chest 2 View  Result Date: 09/17/2018 CLINICAL DATA:  Cough. EXAM: CHEST - 2 VIEW COMPARISON:  Radiograph of September 14, 2018. FINDINGS: Stable cardiomediastinal silhouette. Large hiatal hernia is noted. No pneumothorax is noted. Right-sided pacemaker is unchanged in position. Mild bilateral pleural effusions are noted with associated atelectasis. Bony thorax is unremarkable. IMPRESSION: Stable large hiatal hernia. Mild bilateral pleural effusions are noted with associated atelectasis. Electronically Signed   By: Marijo Conception, M.D.   On: 09/17/2018 14:16   Dg Chest 2 View  Result Date: 09/07/2018 CLINICAL DATA:  Increasing shortness of breath. Decreased oxygen saturation since yesterday. EXAM: CHEST - 2 VIEW COMPARISON:  Single-view of the chest and CT chest 08/29/2018. FINDINGS: There is cardiomegaly without edema. Moderate left pleural effusion and basilar atelectasis are seen. Pulmonary nodules in the left upper lobe and along the periphery of the left lung are identified as seen on prior CT. Small right pulmonary nodule seen on CT are difficult to visualize on this examination. Large hiatal hernia is noted. No pneumothorax. IMPRESSION: No change in a moderate left pleural effusion and left basilar atelectasis. Pulmonary nodules. Cardiomegaly without edema. Hiatal hernia. Electronically Signed   By: Inge Rise M.D.   On: 1Mar 30, 202019 09:02   Ct Head Wo Contrast  Result Date: 09/07/2018 CLINICAL DATA:  Sepsis. Recent diagnosis of suspected metastatic disease in chest abdomen and pelvis on CT studies. New left sided proptosis and left upper and lower extremity weakness. No reported injury. EXAM: CT HEAD WITHOUT CONTRAST TECHNIQUE: Contiguous axial images were obtained from the base of the  skull through the vertex without intravenous contrast. COMPARISON:  07/15/2017 head CT. FINDINGS: Brain: No evidence of parenchymal hemorrhage or extra-axial fluid collection. No mass lesion, mass effect, or midline shift. No CT evidence of acute infarction. Nonspecific mild subcortical and periventricular white matter hypodensity, most in keeping with chronic small vessel ischemic change. Cerebral volume is age appropriate. No ventriculomegaly. Vascular: No acute abnormality. Skull: No evidence of calvarial fracture. Sinuses/Orbits: No fluid levels. Mild mucoperiosteal thickening in the maxillary sinuses bilaterally. Other:  The mastoid air cells are unopacified. IMPRESSION: 1.  No evidence of acute intracranial abnormality. 2. Mild chronic small vessel ischemic changes in the cerebral white matter. 3. Mild chronic appearing paranasal sinusitis. Electronically Signed   By: Ilona Sorrel M.D.   On: 1Mar 30, 202019 12:06   Ct Angio Chest Pe W/cm &/or Wo Cm  Result Date: 09/07/2018 CLINICAL DATA:  Shortness of breath and weakness. 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 EXAM: CT ANGIOGRAPHY CHEST WITH CONTRAST TECHNIQUE: Multidetector CT imaging of the chest was performed using the standard protocol during bolus administration of intravenous contrast. Multiplanar CT image reconstructions and MIPs were obtained to evaluate the vascular anatomy. CONTRAST:  15m ISOVUE-370 IOPAMIDOL (  ISOVUE-370) INJECTION 76% COMPARISON:  CTA of the chest on 08/29/2018 FINDINGS: Cardiovascular: The pulmonary arteries are adequately opacified. There is no evidence of pulmonary embolism. The thoracic aorta is normal in caliber and demonstrates no evidence of aneurysm or dissection. Proximal great vessels are normally patent. The heart size is stable and at the upper limits of normal. Pacemaker shows stable positioning. No pericardial fluid identified. Mediastinum/Nodes: Massive confluent  lymphadenopathy of the left axilla extending into the left subpectoral region and encasing the region of the brachial plexus, left axillary vessels and left subclavian vessels appears progressively enlarged since the prior CTA. Index lateral subpectoral lymph node measured previously at 2.8 cm in short axis now measures approximately 4.1 cm. More inferior left axillary node measured at 2.5 cm in short axis previously now measures 3.1 cm. Confluent lymph node mass encasement of vessels does not cause arterial occlusion but may be causing significant mass effect on venous structures. The veins are not opacified on the study to determine whether any venous thrombus is present. Associated right hilar and mediastinal lymphadenopathy also appears slightly more prominent compared to the prior study. Largest area right hilar lymphadenopathy measures approximately 2.2 cm. Stable large hiatal hernia. Lungs/Pleura: There also is progressive pulmonary involvement with lymphoma with definite progression since the prior CTA. All of the multiple bilateral pulmonary nodules show enlargement since the prior study. Index mass in the subpleural left lower lobe now measures approximately 2.2 x 3.7 cm (previously 1.7 x 3.3 cm). All other lesions also show enlargement. New small right pleural effusion. Small to moderate left pleural effusion appears stable since the prior study. Upper Abdomen: Stable splenomegaly. Musculoskeletal: No chest wall abnormality. No acute or significant osseous findings. Review of the MIP images confirms the above findings. IMPRESSION: 1. No evidence of pulmonary embolism. 2. Progression of lymphoma since the prior CTA on 11/25 with enlargement of left axillary, subpectoral and chest wall lymph node masses with encasement of left subclavian and axillary vascular structures. Enlargement of right hilar and mediastinal lymph nodes since the prior study. Pulmonary involvement also shows significant progression  with enlargement of all of the bilateral pulmonary nodule seen previously. 3. Normal arterial patency demonstrated of the left subclavian and axillary arteries. Venous patency cannot be determined on the CTA study and the patient would be at high risk of left upper extremity DVT given the large bulky lymphadenopathy present. 4. New small right pleural effusion. Stable small to moderate left pleural effusion. 5. Stable splenomegaly. Electronically Signed   By: Aletta Edouard M.D.   On: 125-Apr-202019 12:29   Ct Angio Chest Pe W Or Wo Contrast  Result Date: 08/29/2018 CLINICAL DATA:  Severe left arm swelling since yesterday. Hemoglobin L5. EXAM: CT ANGIOGRAPHY CHEST WITH CONTRAST TECHNIQUE: Multidetector CT imaging of the chest was performed using the standard protocol during bolus administration of intravenous contrast. Multiplanar CT image reconstructions and MIPs were obtained to evaluate the vascular anatomy. CONTRAST:  100 cc ISOVUE-370 IOPAMIDOL (ISOVUE-370) INJECTION 76% COMPARISON:  CXR 08/29/2018 FINDINGS: Cardiovascular: Conventional branch pattern of the great vessels with minimal atherosclerosis at the origins. No significant stenosis. Patent left subclavian artery traversing an area of ill-defined soft tissue edema and enlargement as described below. Mild aortic atherosclerosis without aneurysm or dissection. Satisfactory opacification of the pulmonary arteries to the proximal segmental level without large central pulmonary embolus. Mediastinum/Nodes: Left axillary nodal enlargement is identified ranging size from 0.3 cm to 2.8 cm short axis. Mildly enlarged right-sided axillary lymph nodes up to 1.3  cm short axis are identified. Large hiatal hernia with air-fluid level noted within. 1.4 cm short axis mass abutting the left lateral aspect of the aortic arch in the prevascular space is noted more likely to represent a pulmonary lesion as opposed lymphadenopathy. Nonpathologic sized mediastinal and hilar  lymph nodes are noted. Lungs/Pleura: Masslike opacities are identified in the lingula, the largest abutting the pleural surface measuring 3.3 x 1.7 cm on series 5/92 with previously mentioned paramediastinal mass in the left upper lobe measuring 2.3 x 1.4 cm, series 5/49. Superior segment left lower lobe mass measuring 1.8 x 1.3 cm, series 5/52 is also noted. There are smaller subcentimeter nodules within both upper lobes, right middle lobe and right lower lobe with largest masslike opacities in the right lower lobe measuring 1.1 x 1 cm, series 5/89 and abutting the right lateral margin of the hiatal hernia measuring 2.5 x 1.9 cm, series 5/105. Moderate left effusion with compressive atelectasis is identified with trace right pleural effusion. No pneumothorax. Upper Abdomen: No space-occupying mass of the included liver. The included right adrenal gland is unremarkable. Nodular soft tissue densities in the left upper quadrant are felt to represent partial imaging of small bowel less likely mesenteric adenopathy. The included spleen is top-normal in size. Musculoskeletal: Ill-defined asymmetric soft tissue enlargement in the region of the left pectoralis muscle suspicious for intramuscular hemorrhage and edema given reported rapid onset of left arm swelling and clinical report of low hemoglobin. Asymmetric subcutaneous edema of the partially included left breast with thickening of the skin concerning for possible possible inflammatory carcinoma of the breast. Right-sided pacemaker apparatus is noted with leads in the right atrium right ventricle. Thoracic spondylosis is noted. Review of the MIP images confirms the above findings. IMPRESSION: 1. No acute large central pulmonary embolus, aortic aneurysm or dissection. 2. Multilobar masslike opacities scattered throughout both lungs as above described, the largest in the lingula measuring up to 3.3 cm, presumed metastatic given left axillary adenopathy also noted. 3.  Left axillary nodal enlargement measuring up to 2.8 cm short axis. 4. Asymmetric soft tissue enlargement of the left pectoralis muscle suspicious for intramuscular hemorrhage and edema given reported rapid onset of left arm swelling and clinical report of low hemoglobin. An infiltrating mass or metastasis is also a differential considerations given local adenopathy, asymmetric subcutaneous edema of the partially included left breast with thickening of the skin concerning for possible inflammatory carcinoma of the breast. Clinical correlation is therefore recommended as well as mammographic correlation. 5. Moderate left and trace right pleural effusions with compressive atelectasis. These results were called by telephone at the time of interpretation on 08/29/2018 at 3:43 pm to Dr. Karmen Bongo , who verbally acknowledged these results. Aortic Atherosclerosis (ICD10-I70.0). Electronically Signed   By: Ashley Royalty M.D.   On: 08/29/2018 15:43   Ct Abdomen Pelvis W Contrast  Result Date: 08/31/2018 CLINICAL DATA:  Pulmonary nodules on previous chest CT suspicious for metastatic disease. EXAM: CT ABDOMEN AND PELVIS WITH CONTRAST TECHNIQUE: Multidetector CT imaging of the abdomen and pelvis was performed using the standard protocol following bolus administration of intravenous contrast. CONTRAST:  114m OMNIPAQUE IOHEXOL 300 MG/ML  SOLN COMPARISON:  Chest CT 08/29/2018 FINDINGS: Lower chest: Pacer wires noted in the heart. Large hiatal hernia. Bilateral pleural effusions, left greater than right. Pulmonary nodules noted in both lower lungs. Hepatobiliary: No focal abnormality within the liver parenchyma. Liver measures 20.1 cm craniocaudal length. Dependent sludge or stones noted in the gallbladder. No intrahepatic or  extrahepatic biliary dilation. Pancreas: No focal mass lesion. No dilatation of the main duct. No intraparenchymal cyst. No peripancreatic edema. Spleen: Spleen measures 13.7 cm in craniocaudal  length, upper normal to mildly increased. Adrenals/Urinary Tract: No adrenal nodule or mass. Kidneys unremarkable. Central sinus cysts noted left kidney. No evidence for hydroureter. The urinary bladder appears normal for the degree of distention. Stomach/Bowel: Large hiatal hernia. Duodenum is normally positioned as is the ligament of Treitz. Duodenal diverticulum noted. No small bowel wall thickening. No small bowel dilatation. The terminal ileum is normal. The appendix is not visualized, but there is no edema or inflammation in the region of the cecum. No gross colonic mass. No colonic wall thickening. Diverticular changes are noted in the left colon without evidence of diverticulitis. Vascular/Lymphatic: There is abdominal aortic atherosclerosis without aneurysm. Mild lymphadenopathy noted hepato duodenal ligament with 13 mm short axis index node visible on 21/3. Borderline para-aortic retroperitoneal lymphadenopathy evident. 10 mm short axis left para-aortic lymph node visible on 31/3. relatively bulky lymphadenopathy is seen along both pelvic sidewalls. 2.2 cm short axis left external iliac node is visible on 76/3. 2.8 cm short axis right external iliac lymph node associated with another 2.9 cm short axis node. 2.2 cm short axis lymph node identified in the right groin. Reproductive: Uterus unremarkable.  There is no adnexal mass. Other: No intraperitoneal free fluid. Musculoskeletal: Extensive body wall edema noted left lower hemithorax and left upper abdominal wall. Degenerative changes noted left hip. IMPRESSION: 1. Bulky pelvic sidewall lymphadenopathy with borderline to mild lymphadenopathy in the hepato duodenal ligament and para-aortic retroperitoneal space. Lymphoma or metastatic disease would be primary considerations. 2. Hepatomegaly with borderline splenomegaly. Prominent portal and splenic veins raise the question of portal venous hypertension. 3. Gallbladder sludge or tiny stones. 4. Left chest  wall and upper abdominal wall edema. 5. Large hiatal hernia with bilateral pleural effusions, similar to prior. 6.  Aortic Atherosclerois (ICD10-170.0) Electronically Signed   By: Misty Stanley M.D.   On: 08/31/2018 13:13   Dg Chest Port 1 View  Result Date: 09/21/2018 CLINICAL DATA:  Tachypnea. EXAM: PORTABLE CHEST 1 VIEW COMPARISON:  Chest radiographs 2 days prior, chest CT 1August 18, 202019 FINDINGS: Right-sided pacemaker in place. Right upper extremity PICC in place, tip in the proximal SVC. The patient is significantly rotated. Bilateral pleural effusions and associated airspace disease, likely progressed at the left lung base. Nodular density in the periphery of the left lung again seen. Large retrocardiac hiatal hernia. Unchanged heart size and mediastinal contours. No pulmonary edema. IMPRESSION: Bilateral pleural effusions, increased on the left. Unchanged radiographic appearance of large hiatal hernia. Peripheral nodular opacity as seen on prior chest CT. Additional pulmonary nodules on CT are not visualized radiographically Electronically Signed   By: Keith Rake M.D.   On: 09/21/2018 03:42   Dg Chest Port 1 View  Addendum Date: 09/14/2018   ADDENDUM REPORT: 09/14/2018 09:14 ADDENDUM: The impression should read: Stable chest x-ray demonstrating pulmonary edema and bilateral pleural effusions. Electronically Signed   By: Aletta Edouard M.D.   On: 09/14/2018 09:14   Result Date: 09/14/2018 CLINICAL DATA:  Shortness of breath. History of progressive high-grade B-cell lymphoma. EXAM: PORTABLE CHEST 1 VIEW COMPARISON:  Chest x-ray on 09/09/2018 and CT of the chest on 1August 18, 202019. FINDINGS: Stable heart size, appearance of pacemaker and large hiatal hernia. Stable probable bilateral pleural effusions and bilateral pulmonary nodules. No overt pulmonary edema. No pneumothorax. IMPRESSION: Stable chest x-ray demonstrating pulmonary lymphoma and bilateral pleural effusions. Electronically  Signed: By:  Aletta Edouard M.D. On: 09/11/2018 14:45   Dg Chest Port 1 View  Result Date: 09/14/2018 CLINICAL DATA:  Shortness of breath. EXAM: PORTABLE CHEST 1 VIEW COMPARISON:  Radiographs 09/11/2018 and 09/09/2018. FINDINGS: 0523 hours. Mild patient rotation to the right. The right arm PICC and right subclavian pacemaker leads appear unchanged. The heart size and mediastinal contours are stable. There is a large hiatal hernia. There are persistent bilateral pleural effusions which appear mildly improved on the right. There is no pneumothorax or confluent airspace opacity. IMPRESSION: No acute findings demonstrated. The right pleural effusion appears slightly smaller. Electronically Signed   By: Richardean Sale M.D.   On: 09/14/2018 09:10   Dg Chest Port 1 View  Result Date: 09/09/2018 CLINICAL DATA:  Hypoxia, history of progressive lymphoma EXAM: PORTABLE CHEST 1 VIEW COMPARISON:  1Apr 28, 202019 FINDINGS: Cardiac shadow is enlarged. Pacing device is again seen. Bilateral pleural effusions are again noted. The nodular changes seen on recent chest CT are not as well appreciated on today's exam. No pneumothorax is noted. IMPRESSION: Bilateral effusions. Patchy densities are noted consistent findings of recent CT although less well appreciated on today's exam. Electronically Signed   By: Inez Catalina M.D.   On: 09/09/2018 06:35   Dg Chest Portable 1 View  Result Date: 08/29/2018 CLINICAL DATA:  71 year old female with a history of shortness of breath and edema EXAM: PORTABLE CHEST 1 VIEW COMPARISON:  04/19/2018, 04/18/2018 FINDINGS: Cardiomediastinal silhouette unchanged in size and contour. Double density projects over the lower mediastinum is unchanged. On the frontal view there is a Passy at the left base obscuring the left hemidiaphragm with blunting of left costophrenic angle, increased from the prior. No pneumothorax. Unchanged right chest wall cardiac pacing device with 2 leads in place. No confluent airspace  disease on the right. IMPRESSION: New opacity at the left lung base obscuring left hemidiaphragm in the left heart border, compatible with pleural effusion and associated atelectasis/consolidation. Hiatal hernia. Electronically Signed   By: Corrie Mckusick D.O.   On: 08/29/2018 09:09   Dg Abd Portable 1v  Result Date: 09/13/2018 CLINICAL DATA:  Constipation EXAM: PORTABLE ABDOMEN - 1 VIEW COMPARISON:  CT 08/31/2018 FINDINGS: Nonobstructed bowel gas pattern. Residual contrast within the colon. Mild to moderate feces retention at the rectum. IMPRESSION: Nonobstructed gas pattern with mild to moderate fecal retention at the rectum Electronically Signed   By: Donavan Foil M.D.   On: 09/13/2018 23:51   Dg Swallowing Func-speech Pathology  Result Date: 09/15/2018 Objective Swallowing Evaluation: Type of Study: MBS-Modified Barium Swallow Study  Patient Details Name: ELVINA BOSCH MRN: [id number] Date of Birth: 06/15/1946 Today's Date: 09/15/2018 Time: SLP Start Time (ACUTE ONLY): 1210 -SLP Stop Time (ACUTE ONLY): 1240 SLP Time Calculation (min) (ACUTE ONLY): 30 min Past Medical History: Past Medical History: Diagnosis Date . Arthritis  . Hiatal hernia  . History of chicken pox  . Hyperlipidemia  . Pacemaker  Past Surgical History: Past Surgical History: Procedure Laterality Date . ANKLE SURGERY   . PACEMAKER IMPLANT N/A 04/18/2018  Procedure: PACEMAKER IMPLANT;  Surgeon: Evans Lance, MD;  Location: [facility];  Service: Cardiovascular;  Laterality: N/A; HPI: TERRIONNA BRIDWELL is a 72 y.o. 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  During her hospitalization she underwent left axillary lymph node biopsy.  She was discharged home in  a stable condition, but apparently she has been developing worsening weakness, generalized, worse with exertion, no improving  factors, associated with malaise, chills and poor appetite.  Over the last 24 hours her symptoms have been more severe to the point where she has difficulty standing. She has persistent left upper extremity edema which has been painful, sharp in nature up to 10 out of 10 intensity.  Her weakness has led to dyspnea which has been severe in intensity, and triggered her emergency room visit today.Pt was made NPO yesterday due to concerns for aspiration.   Today pt is alert, has congested weak cough concerrning for airway protection.   Pt underwent MBS yesterday and was placed on full liquid diet with precautions. RN reports pt continues to cough with po intake.  Per note from MD, now plan is for pt to have small bore feeding tube placed for nutritional support.  Unfortunately pt did not tolerate attempt at insertion of feeding tube.  Repeat swallow evaluation ordered.  SLP follow up to inform family/pt of plan and determine if clinically pt appears improved.    Subjective: Alert Assessment / Plan / Recommendation CHL IP CLINICAL IMPRESSIONS 09/15/2018 Clinical Impression Patient had a MBS on 09/12/18 and patient presented with Moderately severe oropharyngeal dysphagia with sensorimotor deficits. She presents much stronger today and presents with a mild oropharyngeal dysphagia. Oral preparation and bolus transfer was mild evidenced by trace oral residue. Patient initiated swallow at the level of the pyriform sinuses with no penetration or aspiration with cup sips of all sizes. She did have penetration with cup sip with straw, but spontaneously cleared layngeal vestibule. She initiated swallow at the valleculae with puree and cracker bolus, no penetration or aspiration. Significant pharyngeal residue present after 1st swallow, but improved mostly cleared with second spontaneous swallow. Upper 1/3 of esphagus observed to clear well with puree and tablet. Due to significant sensorimotor improvement, recommend patient have  regular diet with thin liquids, medication whole in puree. Speech therapy to follow up for continue diet tolerance and swallowing therapy. SLP Visit Diagnosis Dysphagia, oropharyngeal phase (R13.12) Attention and concentration deficit following -- Frontal lobe and executive function deficit following -- Impact on safety and function Mild aspiration risk   CHL IP TREATMENT RECOMMENDATION 09/15/2018 Treatment Recommendations Therapy as outlined in treatment plan below   Prognosis 09/15/2018 Prognosis for Safe Diet Advancement Good Barriers to Reach Goals -- Barriers/Prognosis Comment -- CHL IP DIET RECOMMENDATION 09/15/2018 SLP Diet Recommendations Regular solids;Thin liquid Liquid Administration via Cup;No straw Medication Administration Whole meds with liquid Compensations Slow rate;Small sips/bites;Other (Comment) Postural Changes Remain semi-upright after after feeds/meals (Comment);Seated upright at 90 degrees   CHL IP OTHER RECOMMENDATIONS 09/15/2018 Recommended Consults -- Oral Care Recommendations Oral care before and after PO Other Recommendations --   CHL IP FOLLOW UP RECOMMENDATIONS 09/14/2018 Follow up Recommendations (No Data)   CHL IP FREQUENCY AND DURATION 09/15/2018 Speech Therapy Frequency (ACUTE ONLY) min 2x/week Treatment Duration 1 week      CHL IP ORAL PHASE 09/15/2018 Oral Phase Impaired Oral - Pudding Teaspoon -- Oral - Pudding Cup Lingual/palatal residue Oral - Honey Teaspoon -- Oral - Honey Cup -- Oral - Nectar Teaspoon NT Oral - Nectar Cup NT Oral - Nectar Straw NT Oral - Thin Teaspoon Lingual/palatal residue Oral - Thin Cup Lingual/palatal residue;Impaired mastication Oral - Thin Straw Lingual/palatal residue Oral - Puree Lingual/palatal residue Oral - Mech Soft Lingual/palatal residue Oral - Regular -- Oral - Multi-Consistency -- Oral - Pill -- Oral  Phase - Comment --  CHL IP PHARYNGEAL PHASE 09/15/2018 Pharyngeal Phase Impaired Pharyngeal- Pudding Teaspoon Delayed swallow  initiation-vallecula;Pharyngeal residue - valleculae;Pharyngeal residue - pyriform Pharyngeal -- Pharyngeal- Pudding Cup NT Pharyngeal -- Pharyngeal- Honey Teaspoon -- Pharyngeal -- Pharyngeal- Honey Cup -- Pharyngeal -- Pharyngeal- Nectar Teaspoon NT Pharyngeal -- Pharyngeal- Nectar Cup NT Pharyngeal -- Pharyngeal- Nectar Straw NT Pharyngeal -- Pharyngeal- Thin Teaspoon Delayed swallow initiation-pyriform sinuses;Reduced pharyngeal peristalsis;Reduced anterior laryngeal mobility;Reduced laryngeal elevation;Reduced airway/laryngeal closure;Reduced tongue base retraction;Pharyngeal residue - valleculae;Pharyngeal residue - pyriform Pharyngeal -- Pharyngeal- Thin Cup Delayed swallow initiation-pyriform sinuses;Reduced anterior laryngeal mobility;Reduced laryngeal elevation;Reduced airway/laryngeal closure;Reduced tongue base retraction;Pharyngeal residue - valleculae;Pharyngeal residue - pyriform;Reduced pharyngeal peristalsis Pharyngeal -- Pharyngeal- Thin Straw Delayed swallow initiation-pyriform sinuses;Reduced anterior laryngeal mobility;Reduced laryngeal elevation;Reduced airway/laryngeal closure;Reduced pharyngeal peristalsis;Reduced tongue base retraction;Pharyngeal residue - valleculae;Pharyngeal residue - pyriform;Penetration/Aspiration during swallow Pharyngeal -- Pharyngeal- Puree Delayed swallow initiation-vallecula;Reduced laryngeal elevation;Reduced airway/laryngeal closure;Reduced tongue base retraction;Pharyngeal residue - valleculae Pharyngeal -- Pharyngeal- Mechanical Soft Delayed swallow initiation-vallecula;Reduced laryngeal elevation;Reduced airway/laryngeal closure;Reduced tongue base retraction Pharyngeal -- Pharyngeal- Regular NT Pharyngeal -- Pharyngeal- Multi-consistency -- Pharyngeal -- Pharyngeal- Pill Delayed swallow initiation-vallecula;Reduced laryngeal elevation;Reduced airway/laryngeal closure Pharyngeal -- Pharyngeal Comment --  CHL IP CERVICAL ESOPHAGEAL PHASE 09/15/2018 Cervical  Esophageal Phase WFL Pudding Teaspoon -- Pudding Cup -- Honey Teaspoon -- Honey Cup -- Nectar Teaspoon -- Nectar Cup -- Nectar Straw -- Thin Teaspoon -- Thin Cup -- Thin Straw -- Puree -- Mechanical Soft -- Regular -- Multi-consistency -- Pill -- Cervical Esophageal Comment -- Charlynne Cousins Ward, MA, CCC-SLP 09/15/2018 2:59 PM              Dg Swallowing Func-speech Pathology  Result Date: 09/12/2018 Objective Swallowing Evaluation: Type of Study: MBS-Modified Barium Swallow Study  Patient Details Name: CHABLIS LOSH MRN: [id number] Date of Birth: 11-02-1945 Today's Date: 09/12/2018 Time: SLP Start Time (ACUTE ONLY): 1300 -SLP Stop Time (ACUTE ONLY): 1337 SLP Time Calculation (min) (ACUTE ONLY): 37 min Past Medical History: Past Medical History: Diagnosis Date . Arthritis  . Hiatal hernia  . History of chicken pox  . Hyperlipidemia  . Pacemaker  Past Surgical History: Past Surgical History: Procedure Laterality Date . ANKLE SURGERY   . PACEMAKER IMPLANT N/A 04/18/2018  Procedure: PACEMAKER IMPLANT;  Surgeon: Evans Lance, MD;  Location: [facility];  Service: Cardiovascular;  Laterality: N/A; HPI: TVISHA SCHWOERER is a 72 y.o. 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  During her hospitalization she underwent left axillary lymph node biopsy.  She was discharged home in a stable condition, but apparently she has been developing worsening weakness, generalized, worse with exertion, no improving factors, associated with malaise, chills and poor appetite.  Over the last 24 hours her symptoms have been more severe to the point where she has difficulty standing. She has persistent left upper extremity edema which has been painful, sharp in nature up to 10 out of 10 intensity.  Her weakness has led to dyspnea which has been severe in intensity, and triggered her emergency  room visit today.Pt was made NPO yesterday due to concerns for aspiration.   Today pt is alert, has congested weak cough concerrning for airway protection.  Subjective: The patient was seen sitting upright in bed with her daughter at the bedside.  Assessment / Plan / Recommendation CHL IP CLINICAL IMPRESSIONS 09/12/2018 Clinical Impression Pt presents with moderately  severe oropharyngeal dysphagia with sensorimotor deficits.  Decreased oral propulsion and weak pharyngeal motility results in significant pharyngeal more than oral residuals.  Pt does NOT sense residuals and is unable to "hock" to expectorate to clear them.  Chin tuck, head turn postures did NOT decrease residuals.  Pt did aspirate with thin liquids posterior trachea with delayed cough response.  Although she did not aspirate with thicker consistencies, increased residuals present which if aspiration post-swallow are more difficult for pulmonary clearance.  Pt is grossly weak at this time - uncertain of her baseline swallow function.  Pt is a high aspiration/malnutrition risk due to level of dysphagia and weak nonproductive cough.  If she is to consume po intake= it should be with accepted risks and mitigation strategies.  Question if pt's swallow with improve with improved medical status. SLP Visit Diagnosis Dysphagia, oropharyngeal phase (R13.12) Attention and concentration deficit following -- Frontal lobe and executive function deficit following -- Impact on safety and function Severe aspiration risk;Risk for inadequate nutrition/hydration   CHL IP TREATMENT RECOMMENDATION 09/11/2018 Treatment Recommendations Therapy as outlined in treatment plan below   Prognosis 09/12/2018 Prognosis for Safe Diet Advancement Fair Barriers to Reach Goals Severity of deficits;Cognitive deficits;Other (Comment) Barriers/Prognosis Comment -- CHL IP DIET RECOMMENDATION 09/12/2018 SLP Diet Recommendations (No Data) Liquid Administration via Cup Medication Administration  Crushed with puree Compensations Slow rate;Small sips/bites;Follow solids with liquid Postural Changes Remain semi-upright after after feeds/meals (Comment);Seated upright at 90 degrees   CHL IP OTHER RECOMMENDATIONS 09/12/2018 Recommended Consults -- Oral Care Recommendations Oral care before and after PO Other Recommendations Have oral suction available   CHL IP FOLLOW UP RECOMMENDATIONS 09/12/2018 Follow up Recommendations (No Data)   CHL IP FREQUENCY AND DURATION 09/12/2018 Speech Therapy Frequency (ACUTE ONLY) min 2x/week Treatment Duration 1 week      CHL IP ORAL PHASE 09/12/2018 Oral Phase Impaired Oral - Pudding Teaspoon -- Oral - Pudding Cup -- Oral - Honey Teaspoon -- Oral - Honey Cup -- Oral - Nectar Teaspoon Weak lingual manipulation;Reduced posterior propulsion;Lingual/palatal residue Oral - Nectar Cup Weak lingual manipulation;Reduced posterior propulsion;Lingual/palatal residue Oral - Nectar Straw Weak lingual manipulation;Reduced posterior propulsion;Lingual/palatal residue Oral - Thin Teaspoon Weak lingual manipulation;Reduced posterior propulsion Oral - Thin Cup Weak lingual manipulation;Reduced posterior propulsion Oral - Thin Straw Reduced posterior propulsion Oral - Puree Weak lingual manipulation;Reduced posterior propulsion Oral - Mech Soft Weak lingual manipulation;Reduced posterior propulsion Oral - Regular -- Oral - Multi-Consistency -- Oral - Pill -- Oral Phase - Comment --  CHL IP PHARYNGEAL PHASE 09/12/2018 Pharyngeal Phase Impaired Pharyngeal- Pudding Teaspoon -- Pharyngeal -- Pharyngeal- Pudding Cup -- Pharyngeal -- Pharyngeal- Honey Teaspoon -- Pharyngeal -- Pharyngeal- Honey Cup -- Pharyngeal -- Pharyngeal- Nectar Teaspoon Reduced pharyngeal peristalsis;Reduced epiglottic inversion;Reduced tongue base retraction;Pharyngeal residue - valleculae;Pharyngeal residue - pyriform Pharyngeal -- Pharyngeal- Nectar Cup Reduced pharyngeal peristalsis;Reduced epiglottic inversion;Reduced laryngeal  elevation;Reduced airway/laryngeal closure;Reduced tongue base retraction;Pharyngeal residue - valleculae;Pharyngeal residue - pyriform;Penetration/Aspiration during swallow Pharyngeal Material enters airway, remains ABOVE vocal cords and not ejected out Pharyngeal- Nectar Straw Reduced pharyngeal peristalsis;Reduced epiglottic inversion;Reduced anterior laryngeal mobility;Reduced airway/laryngeal closure;Reduced tongue base retraction;Reduced laryngeal elevation;Pharyngeal residue - valleculae;Pharyngeal residue - pyriform Pharyngeal -- Pharyngeal- Thin Teaspoon Reduced pharyngeal peristalsis;Reduced epiglottic inversion;Reduced laryngeal elevation;Reduced airway/laryngeal closure;Reduced tongue base retraction;Pharyngeal residue - valleculae;Pharyngeal residue - pyriform Pharyngeal -- Pharyngeal- Thin Cup Reduced pharyngeal peristalsis;Reduced epiglottic inversion;Reduced laryngeal elevation;Reduced airway/laryngeal closure;Reduced tongue base retraction;Pharyngeal residue - valleculae;Pharyngeal residue - pyriform;Penetration/Aspiration during swallow Pharyngeal Material enters airway, passes BELOW cords without attempt by  patient to eject out (silent aspiration) Pharyngeal- Thin Straw Reduced pharyngeal peristalsis;Reduced epiglottic inversion;Reduced laryngeal elevation;Reduced airway/laryngeal closure;Reduced tongue base retraction;Pharyngeal residue - valleculae;Pharyngeal residue - pyriform Pharyngeal Material enters airway, passes BELOW cords and not ejected out despite cough attempt by patient Pharyngeal- Puree Reduced epiglottic inversion;Reduced pharyngeal peristalsis;Reduced tongue base retraction;Reduced airway/laryngeal closure;Reduced laryngeal elevation;Pharyngeal residue - valleculae;Pharyngeal residue - pyriform Pharyngeal -- Pharyngeal- Mechanical Soft Reduced pharyngeal peristalsis;Reduced epiglottic inversion;Reduced tongue base retraction;Pharyngeal residue - valleculae;Reduced  airway/laryngeal closure Pharyngeal -- Pharyngeal- Regular -- Pharyngeal -- Pharyngeal- Multi-consistency -- Pharyngeal -- Pharyngeal- Pill -- Pharyngeal -- Pharyngeal Comment --  CHL IP CERVICAL ESOPHAGEAL PHASE 09/12/2018 Cervical Esophageal Phase Impaired Pudding Teaspoon -- Pudding Cup -- Honey Teaspoon -- Honey Cup -- Nectar Teaspoon -- Nectar Cup -- Nectar Straw -- Thin Teaspoon -- Thin Cup -- Thin Straw -- Puree -- Mechanical Soft -- Regular -- Multi-consistency -- Pill -- Cervical Esophageal Comment upon esophageal sweep, pt appeared clear Luanna Salk, MS [facility] SLP Acute Rehab Services Pager [phone] Office [phone] Macario Golds 09/12/2018, 5:29 PM              [place] Core Biopsy (lymph Nodes)  Result Date: 08/30/2018 INDICATION: 72 year old with scattered lung lesions, left arm swelling and extensive left axillary lymphadenopathy. Findings are concerning for metastatic disease and tissue diagnosis is needed. EXAM: ULTRASOUND-GUIDED CORE BIOPSY OF LEFT AXILLARY LYMPH NODE MEDICATIONS: None. ANESTHESIA/SEDATION: Moderate (conscious) sedation was employed during this procedure. A total of Versed 0.5 mg and Fentanyl 50 mcg was administered intravenously. Moderate Sedation Time: 15 minutes. The patient's level of consciousness and vital signs were monitored continuously by radiology nursing throughout the procedure under my direct supervision. FLUOROSCOPY TIME:  None COMPLICATIONS: None immediate. PROCEDURE: Informed written consent was obtained from the patient after a thorough discussion of the procedural risks, benefits and alternatives. All questions were addressed. A timeout was performed prior to the initiation of the procedure. Ultrasound demonstrated enlarged left axillary lymph nodes. The left axilla was prepped and draped in sterile fashion. Skin was anesthetized with 1% lidocaine. 18 gauge core biopsies were obtained of a round enlarged lymph node with ultrasound guidance. Total of 6  core biopsies were obtained. Specimens placed in saline. Bandage placed over the puncture site. FINDINGS: Multiple enlarged hypoechoic lymph nodes in left axilla. Biopsy needle confirmed within the targeted lymph node. No bleeding or hematoma formation at end of procedure. IMPRESSION: Ultrasound-guided core biopsy of an enlarged left axillary lymph node. Electronically Signed   By: Markus Daft M.D.   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 Limitations: Body habitus, swelling and poor ultrasound/tissue interface. Comparison Study: No prior study on file for comparison. Performing Technologist: Sharion Dove RVS  Examination Guidelines: A complete evaluation includes B-mode imaging, spectral Doppler, color Doppler, and power Doppler as needed of all accessible portions of each vessel. Bilateral testing is considered an integral part of a complete examination. Limited examinations for reoccurring indications may be performed as noted.  Right Findings: +----------+------------+----------+---------+-----------+-------+ RIGHT     CompressiblePropertiesPhasicitySpontaneousSummary +----------+------------+----------+---------+-----------+-------+ Subclavian                         Yes       Yes            +----------+------------+----------+---------+-----------+-------+  Left Findings: +----------+------------+----------+---------+-----------+-------+ LEFT  CompressiblePropertiesPhasicitySpontaneousSummary +----------+------------+----------+---------+-----------+-------+ IJV                                Yes       Yes            +----------+------------+----------+---------+-----------+-------+ Subclavian                         Yes       Yes             +----------+------------+----------+---------+-----------+-------+ Axillary      Full                                          +----------+------------+----------+---------+-----------+-------+ Brachial      Full                                          +----------+------------+----------+---------+-----------+-------+ Cephalic      Full                                          +----------+------------+----------+---------+-----------+-------+ Basilic       Full                                          +----------+------------+----------+---------+-----------+-------+  Summary:  Right: No evidence of thrombosis in the subclavian.  Left: No evidence of deep vein thrombosis in the upper extremity. However, unable to visualize the radial and ulnar. No evidence of superficial vein thrombosis in the upper extremity. However, unable to visualize the radial and ulnar. No evidence of thrombosis in the . However, unable to visualize the radial and ulnar. This was a limited study.  *See table(s) above for measurements and observations.  Diagnosing physician: Deitra Mayo MD Electronically signed by Deitra Mayo MD on 08/29/2018 at 4:20:56 PM.    Final    Ir Picc Placement Right >5 Yrs Inc Img Guide  Result Date: 09/09/2018 INDICATION: Acute lymphoma, access for chemotherapy as an inpatient EXAM: ULTRASOUND AND FLUOROSCOPIC GUIDED PICC LINE INSERTION MEDICATIONS: 1% lidocaine local CONTRAST:  None FLUOROSCOPY TIME:  Twenty-four seconds (8 mGy) COMPLICATIONS: None immediate. TECHNIQUE: The procedure, risks, benefits, and alternatives were explained to the patient and informed written consent was obtained. A timeout was performed prior to the initiation of the procedure. The right upper extremity was prepped with chlorhexidine in a sterile fashion, and a sterile drape was applied covering the operative field. Maximum barrier sterile technique with sterile gowns and gloves were used  for the procedure. A timeout was performed prior to the initiation of the procedure. Local anesthesia was provided with 1% lidocaine. Under direct ultrasound guidance, the right brachial vein was accessed with a micropuncture kit after the overlying soft tissues were anesthetized with 1% lidocaine. An ultrasound image was saved for documentation purposes. A guidewire was advanced to the level of the superior caval-atrial junction for measurement purposes and the PICC line was cut to length. A peel-away sheath was placed and a 36 cm, 5 [place], dual lumen was inserted to  level of the superior caval-atrial junction. A post procedure spot fluoroscopic was obtained. The catheter easily aspirated and flushed and was sutured in place. A dressing was placed. The patient tolerated the procedure well without immediate post procedural complication. FINDINGS: After catheter placement, the tip lies within the superior cavoatrial junction. The catheter aspirates and flushes normally and is ready for immediate use. IMPRESSION: Successful ultrasound and fluoroscopic guided placement of a right brachial vein approach, 36 cm, 5 French, dual lumen PICC with tip at the superior caval-atrial junction. The PICC line is ready for immediate use. Electronically Signed   By: Jerilynn Mages.  Shick M.D.   On: 09/09/2018 09:23   [place] Ekg Site Rite  Result Date: 09/08/2018 If Site Rite image not attached, placement could not be confirmed due to current cardiac rhythm.    CBC Recent Labs  Lab 09/18/18 0422  09/20/18 0450 09/21/18 0607 09/22/18 0546 09/23/18 0950 09/24/18 0648  WBC 0.6*   < > 0.1* 0.4* 2.2* 12.7* 12.9*  HGB 8.9*   < > 8.2* 8.0* 7.0* 10.5* 11.0*  HCT 26.8*   < > 25.2* 24.5* 22.1* 31.9* 33.9*  PLT 59*   < > 64* 111* 129* 239 354  MCV 86.5   < > 88.7 87.5 89.5 87.6 88.1  MCH 28.7   < > 28.9 28.6 28.3 28.8 28.6  MCHC 33.2   < > 32.5 32.7 31.7 32.9 32.4  RDW 16.1*   < > 15.9* 16.1* 15.9* 15.0 15.7*  LYMPHSABS 0.0*  --   --   0.0* 0.0* 0.1* 0.3*  MONOABS 0.0*  --   --  0.2 0.5 0.9 1.3*  EOSABS 0.0  --   --  0.0 0.0 0.0 0.0  BASOSABS 0.0  --   --  0.0 0.0 0.1 0.0   < > = values in this interval not displayed.    Chemistries  Recent Labs  Lab 09/18/18 0422 09/19/18 0401 09/20/18 0450 09/21/18 0607 09/22/18 0546  NA 141 140 135 133* 137  K 3.1* 3.6 3.8 3.5 3.1*  CL 99 98 98 94* 106  CO2 34* 35* _0 GLUCOSE 105* 110* 110* 96 89  BUN _1 CREATININE 0.38* 0.40* 0.36* 0.46 0.41*  CALCIUM 8.9 8.7* 8.4* 7.7* 7.9*  MG 1.8 2.1  --   --   --   AST  --  23 16  --  9*  ALT  --  20 18  --  14  ALKPHOS  --  61 61  --  57  BILITOT  --  2.1* 2.1*  --  1.9*    Lab Results  Component Value Date   HGBA1C 5.3 09/13/2012    Roxan Hockey M.D on 09/24/2018 at 6:39 PM  Pager---[phone] Go to [email or web address] - password [email or web address] for contact info  [facility] - Office  [phone]

## 2018-09-24 NOTE — Progress Notes (Signed)
The patient is receiving Protonix by the intravenous route.  Based on criteria approved by the Pharmacy and [place], the medication is being converted to the equivalent oral dose form.  These criteria include: -No active GI bleeding -Able to tolerate diet of full liquids (or better) or tube feeding -Able to tolerate other medications by the oral or enteral route  If you have any questions about this conversion, please contact the Pharmacy Department (phone [phone]).  Thank you. Eudelia Bunch, Pharm.D 09/24/2018 12:42 PM

## 2018-09-25 LAB — COMPREHENSIVE METABOLIC PANEL
ALT: 18 U/L (ref 0–44)
AST: 17 U/L (ref 15–41)
Albumin: 2.4 g/dL — ABNORMAL LOW (ref 3.5–5.0)
Alkaline Phosphatase: 84 U/L (ref 38–126)
Anion gap: 10 (ref 5–15)
BUN: 10 mg/dL (ref 8–23)
CALCIUM: 7.8 mg/dL — AB (ref 8.9–10.3)
CO2: 24 mmol/L (ref 22–32)
Chloride: 103 mmol/L (ref 98–111)
Creatinine, Ser: 0.41 mg/dL — ABNORMAL LOW (ref 0.44–1.00)
GFR calc Af Amer: >60 mL/min (ref >60)
GFR calc non Af Amer: >60 mL/min (ref >60)
Glucose, Bld: 80 mg/dL (ref 70–99)
Potassium: 3.2 mmol/L — ABNORMAL LOW (ref 3.5–5.1)
Sodium: 137 mmol/L (ref 135–145)
Total Bilirubin: 0.6 mg/dL (ref 0.3–1.2)
Total Protein: 4.5 g/dL — ABNORMAL LOW (ref 6.5–8.1)

## 2018-09-25 LAB — CBC WITH DIFFERENTIAL/PLATELET
Abs Immature Granulocytes: 1.1 10*3/uL — ABNORMAL HIGH (ref 0.00–0.07)
BASOS ABS: 0 10*3/uL (ref 0.0–0.1)
Band Neutrophils: 7 %
Basophils Relative: 0 %
EOS PCT: 0 %
Eosinophils Absolute: 0 10*3/uL (ref 0.0–0.5)
HCT: 33.4 % — ABNORMAL LOW (ref 36.0–46.0)
Hemoglobin: 10.7 g/dL — ABNORMAL LOW (ref 12.0–15.0)
Lymphocytes Relative: 1 %
Lymphs Abs: 0.1 10*3/uL — ABNORMAL LOW (ref 0.7–4.0)
MCH: 28 pg (ref 26.0–34.0)
MCHC: 32 g/dL (ref 30.0–36.0)
MCV: 87.4 fL (ref 80.0–100.0)
Metamyelocytes Relative: 6 %
Monocytes Absolute: 1 10*3/uL (ref 0.1–1.0)
Monocytes Relative: 10 %
Myelocytes: 6 %
Neutro Abs: 7.3 10*3/uL (ref 1.7–7.7)
Neutrophils Relative %: 70 %
Platelets: 347 10*3/uL (ref 150–400)
RBC: 3.82 MIL/uL — ABNORMAL LOW (ref 3.87–5.11)
RDW: 15.9 % — ABNORMAL HIGH (ref 11.5–15.5)
WBC: 9.5 10*3/uL (ref 4.0–10.5)
nRBC: 0 % (ref 0.0–0.2)

## 2018-09-25 MED ORDER — POTASSIUM CHLORIDE CRYS ER 20 MEQ PO TBCR
40.0000 meq | EXTENDED_RELEASE_TABLET | Freq: Once | ORAL | Status: AC
  Administered 2018-09-25: 40 meq via ORAL
  Filled 2018-09-25: qty 2

## 2018-09-25 MED ORDER — AMLODIPINE BESYLATE 5 MG PO TABS
5.0000 mg | ORAL_TABLET | Freq: Every day | ORAL | Status: DC
  Administered 2018-09-25 – 2018-10-05 (×11): 5 mg via ORAL
  Filled 2018-09-25 (×11): qty 1

## 2018-09-25 MED ORDER — OXYCODONE HCL 5 MG PO TABS
5.0000 mg | ORAL_TABLET | Freq: Four times a day (QID) | ORAL | Status: DC | PRN
  Filled 2018-09-25 (×2): qty 1

## 2018-09-25 MED ORDER — GUAIFENESIN ER 600 MG PO TB12
600.0000 mg | ORAL_TABLET | Freq: Two times a day (BID) | ORAL | Status: DC
  Administered 2018-09-25 – 2018-10-05 (×20): 600 mg via ORAL
  Filled 2018-09-25 (×20): qty 1

## 2018-09-25 MED ORDER — OXYCODONE HCL 5 MG PO TABS
5.0000 mg | ORAL_TABLET | Freq: Four times a day (QID) | ORAL | Status: DC | PRN
  Administered 2018-09-25 – 2018-09-27 (×6): 10 mg via ORAL
  Administered 2018-09-28 – 2018-09-30 (×5): 5 mg via ORAL
  Administered 2018-10-03: 10 mg via ORAL
  Filled 2018-09-25: qty 1
  Filled 2018-09-25: qty 2
  Filled 2018-09-25: qty 1
  Filled 2018-09-25: qty 2
  Filled 2018-09-25: qty 1
  Filled 2018-09-25 (×4): qty 2
  Filled 2018-09-25 (×3): qty 1
  Filled 2018-09-25: qty 2
  Filled 2018-09-25: qty 1

## 2018-09-25 MED ORDER — ENOXAPARIN SODIUM 40 MG/0.4ML ~~LOC~~ SOLN
40.0000 mg | SUBCUTANEOUS | Status: DC
  Administered 2018-09-25 – 2018-09-29 (×4): 40 mg via SUBCUTANEOUS
  Filled 2018-09-25 (×4): qty 0.4

## 2018-09-25 MED ORDER — LEVALBUTEROL HCL 0.63 MG/3ML IN NEBU
0.6300 mg | INHALATION_SOLUTION | Freq: Two times a day (BID) | RESPIRATORY_TRACT | Status: DC
  Administered 2018-09-25 – 2018-10-05 (×16): 0.63 mg via RESPIRATORY_TRACT
  Filled 2018-09-25 (×20): qty 3

## 2018-09-25 NOTE — Progress Notes (Addendum)
PROGRESS NOTE        PATIENT DETAILS Name: Patricia Murillo Age: 72 y.o. Sex: female Date of Birth: 1946-07-30 Admit Date: 09/07/2018 Admitting Physician Mauricio Gerome Apley, MD LFY:[id number], No Pcp Per  Brief Narrative: 72 y.o.with newly diagnosed high-grade B-cell lymphoma, recently discharged from hospital after biopsy, presented to the emergency room with progressive weakness, dyspnea, chills and fever, anorexia, and extremity edema, found to have Pseudomonas bacteremia, as well as enterococcus and Pseudomonas UTI.  Currently doing well on IV Levaquin-awaiting SNF placement.  Subjective: Lying comfortably in bed-no chest pain or shortness of breath.  Awake-alert and following commands.  Assessment/Plan: Neutropenic fever secondary to Pseudomonas aeruginosa bacteremia and complicated UTI due to Pseudomonas/enterococcus: Afebrile-neutropenia has resolved.  Prior MD-spoke with infectious disease MD and has been transitioned to IV Levaquin for 5 days from 12/21 to cover both Pseudomonas and enterococcus in urine.  Repeat blood culture on 12/20- so far.  Diffuse large B-cell lymphoma: Oncology following-with plans to resume second cycle of chemotherapy this coming Friday.  Continue prednisone 20 mg daily.  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  Did require PRBC transfusion during this hospital stay.  Thrombocytopenia: Secondary to lymphoma and recent chemotherapy, resolved with supportive care.  Acute hypoxic respiratory failure: Thought to be secondary to atelectasis-and progressive pulmonary involvement with lymphoma-encourage incentive spirometry/flutter valve-remains on bronchodilators/empiric IV levofloxacin  Acute metabolic encephalopathy: Resolved-thought to be multifactorial from neutropenic fever, hypercalcemia.  She is awake and alert this morning.  Hypercalcemia: Resolved-secondary to  malignancy-received bisphosphonates.  Follow electrolytes periodically  Hypokalemia: Replete and recheck  Moderate protein calorie malnutrition: Continue supplements  Upper extremity swelling: Doppler (11/25) negative for DVT-suspect secondary to lymphoma-with lymphatic obstruction and hypoalbuminemia.  Follow.  Unstageable pressure injury to the sacrococcygeal area: Continue care per wound care RN.  DVT Prophylaxis: Prophylactic Lovenox-now that thrombocytopenia  Code Status: Full code   Family Communication: Daughter at bedside  Disposition Plan: Remain inpatient- SNF on discharge  Antimicrobial agents: Anti-infectives (From admission, onward)   Start     Dose/Rate Route Frequency Ordered Stop   09/24/18 1500  levofloxacin (LEVAQUIN) IVPB 750 mg     750 mg 100 mL/hr over 90 Minutes Intravenous Every 24 hours 09/24/18 1023 09/29/18 1459   09/21/18 1200  ceFEPIme (MAXIPIME) 2 g in sodium chloride 0.9 % 100 mL IVPB     2 g 200 mL/hr over 30 Minutes Intravenous Every 8 hours 09/21/18 1027 09/24/18 1230   09/12/18 1600  ampicillin-sulbactam (UNASYN) 1.5 g in sodium chloride 0.9 % 100 mL IVPB  Status:  Discontinued     1.5 g 200 mL/hr over 30 Minutes Intravenous Every 6 hours 09/12/18 1408 09/15/18 0753   09/10/18 1000  acyclovir (ZOVIRAX) 200 MG capsule 400 mg     400 mg Oral 2 times daily 09/10/18 0754     09/07/18 2000  cefTRIAXone (ROCEPHIN) 1 g in sodium chloride 0.9 % 100 mL IVPB  Status:  Discontinued     1 g 200 mL/hr over 30 Minutes Intravenous Every 24 hours 09/07/18 1844 09/12/18 0940   09/07/18 0830  ceFEPIme (MAXIPIME) 2 g in sodium chloride 0.9 % 100 mL IVPB     2 g 200 mL/hr over 30 Minutes Intravenous  Once 09/07/18 0820 09/07/18 0912   09/07/18 0830  metroNIDAZOLE (FLAGYL) IVPB 500 mg  Status:  Discontinued     500 mg 100 mL/hr over 60 Minutes Intravenous Every 8 hours 09/07/18 0820 09/08/18 1812   09/07/18 0830  vancomycin (VANCOCIN) IVPB 1000 mg/200 mL  premix     1,000 mg 200 mL/hr over 60 Minutes Intravenous  Once 09/07/18 0820 09/07/18 1157      Procedures: None  CONSULTS:  hematology/oncology  Time spent: 25 minutes-Greater than 50% of this time was spent in counseling, explanation of diagnosis, planning of further management, and coordination of care.  MEDICATIONS: Scheduled Meds: . acyclovir  400 mg Oral BID  . allopurinol  300 mg Oral Daily  . cholecalciferol  2,000 Units Oral Daily  . collagenase   Topical Daily  . famotidine  20 mg Oral Daily  . feeding supplement (ENSURE ENLIVE)  237 mL Oral BID BM  . fluticasone  2 spray Each Nare Daily  . folic acid  1 mg Oral Daily  . levalbuterol  0.63 mg Nebulization BID  . loratadine  10 mg Oral Daily  . mouth rinse  15 mL Mouth Rinse BID  . megestrol  400 mg Oral Daily  . mupirocin ointment   Topical BID  . pantoprazole  40 mg Oral BID  . polyethylene glycol  17 g Oral Daily  . potassium chloride  40 mEq Oral Once  . predniSONE  20 mg Oral Q breakfast  . senna-docusate  1 tablet Oral BID   Continuous Infusions: . sodium chloride 250 mL (09/25/18 1109)  . sodium chloride    . levofloxacin (LEVAQUIN) IV 750 mg (09/24/18 1500)   PRN Meds:.sodium chloride, sodium chloride, acetaminophen **OR** [DISCONTINUED] acetaminophen, alteplase, alum & mag hydroxide-simeth **AND** lidocaine, benzonatate, guaiFENesin-dextromethorphan, HYDROcodone-acetaminophen, lidocaine, lip balm, meclizine, ondansetron **OR** ondansetron (ZOFRAN) IV, sodium chloride flush   PHYSICAL EXAM: Vital signs: Vitals:   09/24/18 2108 09/25/18 0532 09/25/18 0912 09/25/18 0916  BP: (!) 158/78 (!) 156/73    Pulse: 91 89    Resp: 17 17    Temp: 99.5 F (37.5 C) 97.8 F (36.6 C)    TempSrc: Oral Oral    SpO2: 96% 95% 92% 92%  Weight:      Height:       Filed Weights   09/18/18 0359 09/20/18 0500 09/22/18 0523  Weight: 126 kg 126 kg 108 kg   Body mass index is 38.41 kg/m.   General appearance  :Awake, alert, not in any distress.  Frail-chronically sick appearing. HEENT: Atraumatic and Normocephalic Neck: supple Resp:Good air entry bilaterally-decreased air entry at bilateral bases-only a few scattered rhonchi but mostly clear CVS: S1 S2 regular, no murmurs.  GI: Bowel sounds present, Non tender and not distended with no gaurding, rigidity or rebound.No organomegaly Extremities: B/L Lower Ext shows no edema, both legs are warm to touch Neurology: Moves all 4 extremities-but appears to have generalized weakness. Musculoskeletal:No digital cyanosis Skin:No Rash, warm and dry   I have personally reviewed following labs and imaging studies  LABORATORY DATA: CBC: Recent Labs  Lab 09/21/18 0607 09/22/18 0546 09/23/18 0950 09/24/18 0648 09/25/18 0523  WBC 0.4* 2.2* 12.7* 12.9* 9.5  NEUTROABS 0.1* 1.6* 11.0* 10.2* 7.3  HGB 8.0* 7.0* 10.5* 11.0* 10.7*  HCT 24.5* 22.1* 31.9* 33.9* 33.4*  MCV 87.5 89.5 87.6 88.1 87.4  PLT 111* 129* 239 354 315    Basic Metabolic Panel: Recent Labs  Lab 09/19/18 0401 09/20/18 0450 09/21/18 0607 09/22/18 0546 09/25/18 0523  NA 140 135 133* 137 137  K 3.6 3.8 3.5 3.1* 3.2*  CL 98 98 94* 106 103  CO2 35* _0 GLUCOSE 110* 110* 96 89 80  BUN _1 CREATININE 0.40* 0.36* 0.46 0.41* 0.41*  CALCIUM 8.7* 8.4* 7.7* 7.9* 7.8*  MG 2.1  --   --   --   --     GFR: Estimated Creatinine Clearance: 80.2 mL/min (A) (by C-G formula based on SCr of 0.41 mg/dL (L)).  Liver Function Tests: Recent Labs  Lab 09/19/18 0401 09/20/18 0450 09/22/18 0546 09/25/18 0523  AST 23 16 9* 17  ALT _2 ALKPHOS 61 61 57 84  BILITOT 2.1* 2.1* 1.9* 0.6  PROT 5.2* 5.0* 4.6* 4.5*  ALBUMIN 3.5 3.2* 2.4* 2.4*   No results for input(s): LIPASE, AMYLASE in the last 168 hours. No results for input(s): AMMONIA in the last 168 hours.  Coagulation Profile: No results for input(s): INR, PROTIME in the last 168 hours.  Cardiac  Enzymes: No results for input(s): CKTOTAL, CKMB, CKMBINDEX, TROPONINI in the last 168 hours.  BNP (last 3 results) No results for input(s): PROBNP in the last 8760 hours.  HbA1C: No results for input(s): HGBA1C in the last 72 hours.  CBG: No results for input(s): GLUCAP in the last 168 hours.  Lipid Profile: No results for input(s): CHOL, HDL, LDLCALC, TRIG, CHOLHDL, LDLDIRECT in the last 72 hours.  Thyroid Function Tests: No results for input(s): TSH, T4TOTAL, FREET4, T3FREE, THYROIDAB in the last 72 hours.  Anemia Panel: No results for input(s): VITAMINB12, FOLATE, FERRITIN, TIBC, IRON, RETICCTPCT in the last 72 hours.  Urine analysis:    Component Value Date/Time   COLORURINE YELLOW 09/21/2018 1543   APPEARANCEUR HAZY (A) 09/21/2018 1543   LABSPEC 1.015 09/21/2018 1543   PHURINE 6.0 09/21/2018 1543   GLUCOSEU NEGATIVE 09/21/2018 1543   HGBUR SMALL (A) 09/21/2018 1543   BILIRUBINUR NEGATIVE 09/21/2018 1543   KETONESUR NEGATIVE 09/21/2018 1543   PROTEINUR 100 (A) 09/21/2018 1543   NITRITE NEGATIVE 09/21/2018 1543   LEUKOCYTESUR MODERATE (A) 09/21/2018 1543    Sepsis Labs: Lactic Acid, Venous    Component Value Date/Time   LATICACIDVEN 2.32 (HH) 12020/09/2718 1056    MICROBIOLOGY: Recent Results (from the past 240 hour(s))  Culture, blood (routine x 2)     Status: Abnormal   Collection Time: 09/21/18  8:13 AM  Result Value Ref Range Status   Specimen Description   Final    BLOOD RIGHT ANTECUBITAL Performed at [facility], [place] [address] [place], [place] [address]    Special Requests   Final    BOTTLES DRAWN AEROBIC AND ANAEROBIC Blood Culture adequate volume Performed at [facility] [address], [place], [place] [address]    Culture  Setup Time   Final    GRAM NEGATIVE RODS AEROBIC BOTTLE ONLY CRITICAL RESULT CALLED TO, READ BACK BY AND VERIFIED WITHLavell Luster [facility] 3893 09/22/18 A BROWNING Performed at [facility], [place] [address] [place], [place] [address]    Culture PSEUDOMONAS AERUGINOSA (A)  Final   Report Status 09/24/2018 FINAL  Final   Organism ID, Bacteria PSEUDOMONAS AERUGINOSA  Final      Susceptibility   Pseudomonas aeruginosa - MIC*    CEFTAZIDIME 4 SENSITIVE Sensitive     CIPROFLOXACIN <=0.25 SENSITIVE Sensitive     GENTAMICIN <=1 SENSITIVE Sensitive     IMIPENEM 2 SENSITIVE Sensitive     PIP/TAZO <=4 SENSITIVE Sensitive  CEFEPIME 4 SENSITIVE Sensitive     * PSEUDOMONAS AERUGINOSA  Blood Culture ID Panel (Reflexed)     Status: Abnormal   Collection Time: 09/21/18  8:13 AM  Result Value Ref Range Status   Enterococcus species NOT DETECTED NOT DETECTED Final   Listeria monocytogenes NOT DETECTED NOT DETECTED Final   Staphylococcus species NOT DETECTED NOT DETECTED Final   Staphylococcus aureus (BCID) NOT DETECTED NOT DETECTED Final   Streptococcus species NOT DETECTED NOT DETECTED Final   Streptococcus agalactiae NOT DETECTED NOT DETECTED Final   Streptococcus pneumoniae NOT DETECTED NOT DETECTED Final   Streptococcus pyogenes NOT DETECTED NOT DETECTED Final   Acinetobacter baumannii NOT DETECTED NOT DETECTED Final   Enterobacteriaceae species NOT DETECTED NOT DETECTED Final   Enterobacter cloacae complex NOT DETECTED NOT DETECTED Final   Escherichia coli NOT DETECTED NOT DETECTED Final   Klebsiella oxytoca NOT DETECTED NOT DETECTED Final   Klebsiella pneumoniae NOT DETECTED NOT DETECTED Final   Proteus species NOT DETECTED NOT DETECTED Final   Serratia marcescens NOT DETECTED NOT DETECTED Final   Carbapenem resistance NOT DETECTED NOT DETECTED Final   Haemophilus influenzae NOT DETECTED NOT DETECTED Final   Neisseria meningitidis NOT DETECTED NOT DETECTED Final   Pseudomonas aeruginosa DETECTED (A) NOT DETECTED Final    Comment: CRITICAL RESULT CALLED TO, READ BACK BY AND VERIFIED WITHLavell Luster [facility] 7939 09/22/18 A BROWNING    Candida albicans NOT DETECTED NOT  DETECTED Final   Candida glabrata NOT DETECTED NOT DETECTED Final   Candida krusei NOT DETECTED NOT DETECTED Final   Candida parapsilosis NOT DETECTED NOT DETECTED Final   Candida tropicalis NOT DETECTED NOT DETECTED Final    Comment: Performed at [facility], [address] [place], [place] [address]  Culture, blood (routine x 2)     Status: None (Preliminary result)   Collection Time: 09/21/18  8:56 AM  Result Value Ref Range Status   Specimen Description   Final    BLOOD RIGHT HAND Performed at [facility] [address] [place], [place] [address]    Special Requests   Final    BOTTLES DRAWN AEROBIC AND ANAEROBIC Blood Culture results may not be optimal due to an inadequate volume of blood received in culture bottles Performed at [facility] [address] [place], [place] [address]    Culture   Final    NO GROWTH 4 DAYS Performed at [facility], [place] [address] [place], [place] [address]    Report Status PENDING  Incomplete  Urine Culture     Status: Abnormal   Collection Time: 09/21/18  3:43 PM  Result Value Ref Range Status   Specimen Description   Final    URINE, CLEAN CATCH Performed at [facility], [place] [address], [place], [place] [address]    Special Requests   Final    Immunocompromised Performed at [facility], [place] [address], [place], [place] [address]    Culture (A)  Final    20,000 COLONIES/mL PSEUDOMONAS AERUGINOSA 20,000 COLONIES/mL ENTEROCOCCUS FAECALIS    Report Status 09/24/2018 FINAL  Final   Organism ID, Bacteria PSEUDOMONAS AERUGINOSA (A)  Final   Organism ID, Bacteria ENTEROCOCCUS FAECALIS (A)  Final      Susceptibility   Enterococcus faecalis - MIC*    AMPICILLIN <=2 SENSITIVE Sensitive     LEVOFLOXACIN 1 SENSITIVE Sensitive     NITROFURANTOIN <=16 SENSITIVE Sensitive  VANCOMYCIN 1 SENSITIVE Sensitive     * 20,000 COLONIES/mL ENTEROCOCCUS  FAECALIS   Pseudomonas aeruginosa - MIC*    CEFTAZIDIME 2 SENSITIVE Sensitive     CIPROFLOXACIN <=0.25 SENSITIVE Sensitive     GENTAMICIN <=1 SENSITIVE Sensitive     IMIPENEM 2 SENSITIVE Sensitive     PIP/TAZO 8 SENSITIVE Sensitive     CEFEPIME 2 SENSITIVE Sensitive     * 20,000 COLONIES/mL PSEUDOMONAS AERUGINOSA  Culture, blood (Routine X 2) w Reflex to ID Panel     Status: None (Preliminary result)   Collection Time: 09/23/18  6:17 PM  Result Value Ref Range Status   Specimen Description   Final    BLOOD RIGHT ARM Performed at [facility] [address], [place], [place] [address]    Special Requests   Final    BOTTLES DRAWN AEROBIC AND ANAEROBIC Blood Culture adequate volume Performed at [facility] [address] [place], [place] [address]    Culture   Final    NO GROWTH 2 DAYS Performed at [facility] [address], [place], [place] [address]    Report Status PENDING  Incomplete    RADIOLOGY STUDIES/RESULTS: Dg Chest 2 View  Result Date: 09/17/2018 CLINICAL DATA:  Cough. EXAM: CHEST - 2 VIEW COMPARISON:  Radiograph of September 14, 2018. FINDINGS: Stable cardiomediastinal silhouette. Large hiatal hernia is noted. No pneumothorax is noted. Right-sided pacemaker is unchanged in position. Mild bilateral pleural effusions are noted with associated atelectasis. Bony thorax is unremarkable. IMPRESSION: Stable large hiatal hernia. Mild bilateral pleural effusions are noted with associated atelectasis. Electronically Signed   By: Marijo Conception, M.D.   On: 09/17/2018 14:16   Dg Chest 2 View  Result Date: 09/07/2018 CLINICAL DATA:  Increasing shortness of breath. Decreased oxygen saturation since yesterday. EXAM: CHEST - 2 VIEW COMPARISON:  Single-view of the chest and CT chest 08/29/2018. FINDINGS: There is cardiomegaly without edema. Moderate left pleural effusion and basilar atelectasis are seen. Pulmonary nodules in the left upper  lobe and along the periphery of the left lung are identified as seen on prior CT. Small right pulmonary nodule seen on CT are difficult to visualize on this examination. Large hiatal hernia is noted. No pneumothorax. IMPRESSION: No change in a moderate left pleural effusion and left basilar atelectasis. Pulmonary nodules. Cardiomegaly without edema. Hiatal hernia. Electronically Signed   By: Inge Rise M.D.   On: 1November 11, 202019 09:02   Ct Head Wo Contrast  Result Date: 09/07/2018 CLINICAL DATA:  Sepsis. Recent diagnosis of suspected metastatic disease in chest abdomen and pelvis on CT studies. New left sided proptosis and left upper and lower extremity weakness. No reported injury. EXAM: CT HEAD WITHOUT CONTRAST TECHNIQUE: Contiguous axial images were obtained from the base of the skull through the vertex without intravenous contrast. COMPARISON:  07/15/2017 head CT. FINDINGS: Brain: No evidence of parenchymal hemorrhage or extra-axial fluid collection. No mass lesion, mass effect, or midline shift. No CT evidence of acute infarction. Nonspecific mild subcortical and periventricular white matter hypodensity, most in keeping with chronic small vessel ischemic change. Cerebral volume is age appropriate. No ventriculomegaly. Vascular: No acute abnormality. Skull: No evidence of calvarial fracture. Sinuses/Orbits: No fluid levels. Mild mucoperiosteal thickening in the maxillary sinuses bilaterally. Other:  The mastoid air cells are unopacified. IMPRESSION: 1.  No evidence of acute intracranial abnormality. 2. Mild chronic small vessel ischemic changes in the cerebral white matter. 3. Mild chronic appearing paranasal sinusitis. Electronically  Signed   By: Ilona Sorrel M.D.   On: 108-03-202019 12:06   Ct Angio Chest Pe W/cm &/or Wo Cm  Result Date: 09/07/2018 CLINICAL DATA:  Shortness of breath and weakness. 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 EXAM: CT ANGIOGRAPHY  CHEST WITH CONTRAST TECHNIQUE: Multidetector CT imaging of the chest was performed using the standard protocol during bolus administration of intravenous contrast. Multiplanar CT image reconstructions and MIPs were obtained to evaluate the vascular anatomy. CONTRAST:  132m ISOVUE-370 IOPAMIDOL (ISOVUE-370) INJECTION 76% COMPARISON:  CTA of the chest on 08/29/2018 FINDINGS: Cardiovascular: The pulmonary arteries are adequately opacified. There is no evidence of pulmonary embolism. The thoracic aorta is normal in caliber and demonstrates no evidence of aneurysm or dissection. Proximal great vessels are normally patent. The heart size is stable and at the upper limits of normal. Pacemaker shows stable positioning. No pericardial fluid identified. Mediastinum/Nodes: Massive confluent lymphadenopathy of the left axilla extending into the left subpectoral region and encasing the region of the brachial plexus, left axillary vessels and left subclavian vessels appears progressively enlarged since the prior CTA. Index lateral subpectoral lymph node measured previously at 2.8 cm in short axis now measures approximately 4.1 cm. More inferior left axillary node measured at 2.5 cm in short axis previously now measures 3.1 cm. Confluent lymph node mass encasement of vessels does not cause arterial occlusion but may be causing significant mass effect on venous structures. The veins are not opacified on the study to determine whether any venous thrombus is present. Associated right hilar and mediastinal lymphadenopathy also appears slightly more prominent compared to the prior study. Largest area right hilar lymphadenopathy measures approximately 2.2 cm. Stable large hiatal hernia. Lungs/Pleura: There also is progressive pulmonary involvement with lymphoma with definite progression since the prior CTA. All of the multiple bilateral pulmonary nodules show enlargement since the prior study. Index mass in the subpleural left lower  lobe now measures approximately 2.2 x 3.7 cm (previously 1.7 x 3.3 cm). All other lesions also show enlargement. New small right pleural effusion. Small to moderate left pleural effusion appears stable since the prior study. Upper Abdomen: Stable splenomegaly. Musculoskeletal: No chest wall abnormality. No acute or significant osseous findings. Review of the MIP images confirms the above findings. IMPRESSION: 1. No evidence of pulmonary embolism. 2. Progression of lymphoma since the prior CTA on 11/25 with enlargement of left axillary, subpectoral and chest wall lymph node masses with encasement of left subclavian and axillary vascular structures. Enlargement of right hilar and mediastinal lymph nodes since the prior study. Pulmonary involvement also shows significant progression with enlargement of all of the bilateral pulmonary nodule seen previously. 3. Normal arterial patency demonstrated of the left subclavian and axillary arteries. Venous patency cannot be determined on the CTA study and the patient would be at high risk of left upper extremity DVT given the large bulky lymphadenopathy present. 4. New small right pleural effusion. Stable small to moderate left pleural effusion. 5. Stable splenomegaly. Electronically Signed   By: GAletta EdouardM.D.   On: 108-03-202019 12:29   Ct Angio Chest Pe W Or Wo Contrast  Result Date: 08/29/2018 CLINICAL DATA:  Severe left arm swelling since yesterday. Hemoglobin L5. EXAM: CT ANGIOGRAPHY CHEST WITH CONTRAST TECHNIQUE: Multidetector CT imaging of the chest was performed using the standard protocol during bolus administration of intravenous contrast. Multiplanar CT image reconstructions and MIPs were obtained to evaluate the vascular anatomy. CONTRAST:  100 cc ISOVUE-370 IOPAMIDOL (ISOVUE-370) INJECTION  76% COMPARISON:  CXR 08/29/2018 FINDINGS: Cardiovascular: Conventional branch pattern of the great vessels with minimal atherosclerosis at the origins. No significant  stenosis. Patent left subclavian artery traversing an area of ill-defined soft tissue edema and enlargement as described below. Mild aortic atherosclerosis without aneurysm or dissection. Satisfactory opacification of the pulmonary arteries to the proximal segmental level without large central pulmonary embolus. Mediastinum/Nodes: Left axillary nodal enlargement is identified ranging size from 0.3 cm to 2.8 cm short axis. Mildly enlarged right-sided axillary lymph nodes up to 1.3 cm short axis are identified. Large hiatal hernia with air-fluid level noted within. 1.4 cm short axis mass abutting the left lateral aspect of the aortic arch in the prevascular space is noted more likely to represent a pulmonary lesion as opposed lymphadenopathy. Nonpathologic sized mediastinal and hilar lymph nodes are noted. Lungs/Pleura: Masslike opacities are identified in the lingula, the largest abutting the pleural surface measuring 3.3 x 1.7 cm on series 5/92 with previously mentioned paramediastinal mass in the left upper lobe measuring 2.3 x 1.4 cm, series 5/49. Superior segment left lower lobe mass measuring 1.8 x 1.3 cm, series 5/52 is also noted. There are smaller subcentimeter nodules within both upper lobes, right middle lobe and right lower lobe with largest masslike opacities in the right lower lobe measuring 1.1 x 1 cm, series 5/89 and abutting the right lateral margin of the hiatal hernia measuring 2.5 x 1.9 cm, series 5/105. Moderate left effusion with compressive atelectasis is identified with trace right pleural effusion. No pneumothorax. Upper Abdomen: No space-occupying mass of the included liver. The included right adrenal gland is unremarkable. Nodular soft tissue densities in the left upper quadrant are felt to represent partial imaging of small bowel less likely mesenteric adenopathy. The included spleen is top-normal in size. Musculoskeletal: Ill-defined asymmetric soft tissue enlargement in the region of the  left pectoralis muscle suspicious for intramuscular hemorrhage and edema given reported rapid onset of left arm swelling and clinical report of low hemoglobin. Asymmetric subcutaneous edema of the partially included left breast with thickening of the skin concerning for possible possible inflammatory carcinoma of the breast. Right-sided pacemaker apparatus is noted with leads in the right atrium right ventricle. Thoracic spondylosis is noted. Review of the MIP images confirms the above findings. IMPRESSION: 1. No acute large central pulmonary embolus, aortic aneurysm or dissection. 2. Multilobar masslike opacities scattered throughout both lungs as above described, the largest in the lingula measuring up to 3.3 cm, presumed metastatic given left axillary adenopathy also noted. 3. Left axillary nodal enlargement measuring up to 2.8 cm short axis. 4. Asymmetric soft tissue enlargement of the left pectoralis muscle suspicious for intramuscular hemorrhage and edema given reported rapid onset of left arm swelling and clinical report of low hemoglobin. An infiltrating mass or metastasis is also a differential considerations given local adenopathy, asymmetric subcutaneous edema of the partially included left breast with thickening of the skin concerning for possible inflammatory carcinoma of the breast. Clinical correlation is therefore recommended as well as mammographic correlation. 5. Moderate left and trace right pleural effusions with compressive atelectasis. These results were called by telephone at the time of interpretation on 08/29/2018 at 3:43 pm to Dr. Karmen Bongo , who verbally acknowledged these results. Aortic Atherosclerosis (ICD10-I70.0). Electronically Signed   By: Ashley Royalty M.D.   On: 08/29/2018 15:43   Ct Abdomen Pelvis W Contrast  Result Date: 08/31/2018 CLINICAL DATA:  Pulmonary nodules on previous chest CT suspicious for metastatic disease. EXAM: CT ABDOMEN AND  PELVIS WITH CONTRAST  TECHNIQUE: Multidetector CT imaging of the abdomen and pelvis was performed using the standard protocol following bolus administration of intravenous contrast. CONTRAST:  159m OMNIPAQUE IOHEXOL 300 MG/ML  SOLN COMPARISON:  Chest CT 08/29/2018 FINDINGS: Lower chest: Pacer wires noted in the heart. Large hiatal hernia. Bilateral pleural effusions, left greater than right. Pulmonary nodules noted in both lower lungs. Hepatobiliary: No focal abnormality within the liver parenchyma. Liver measures 20.1 cm craniocaudal length. Dependent sludge or stones noted in the gallbladder. No intrahepatic or extrahepatic biliary dilation. Pancreas: No focal mass lesion. No dilatation of the main duct. No intraparenchymal cyst. No peripancreatic edema. Spleen: Spleen measures 13.7 cm in craniocaudal length, upper normal to mildly increased. Adrenals/Urinary Tract: No adrenal nodule or mass. Kidneys unremarkable. Central sinus cysts noted left kidney. No evidence for hydroureter. The urinary bladder appears normal for the degree of distention. Stomach/Bowel: Large hiatal hernia. Duodenum is normally positioned as is the ligament of Treitz. Duodenal diverticulum noted. No small bowel wall thickening. No small bowel dilatation. The terminal ileum is normal. The appendix is not visualized, but there is no edema or inflammation in the region of the cecum. No gross colonic mass. No colonic wall thickening. Diverticular changes are noted in the left colon without evidence of diverticulitis. Vascular/Lymphatic: There is abdominal aortic atherosclerosis without aneurysm. Mild lymphadenopathy noted hepato duodenal ligament with 13 mm short axis index node visible on 21/3. Borderline para-aortic retroperitoneal lymphadenopathy evident. 10 mm short axis left para-aortic lymph node visible on 31/3. relatively bulky lymphadenopathy is seen along both pelvic sidewalls. 2.2 cm short axis left external iliac node is visible on 76/3. 2.8 cm short  axis right external iliac lymph node associated with another 2.9 cm short axis node. 2.2 cm short axis lymph node identified in the right groin. Reproductive: Uterus unremarkable.  There is no adnexal mass. Other: No intraperitoneal free fluid. Musculoskeletal: Extensive body wall edema noted left lower hemithorax and left upper abdominal wall. Degenerative changes noted left hip. IMPRESSION: 1. Bulky pelvic sidewall lymphadenopathy with borderline to mild lymphadenopathy in the hepato duodenal ligament and para-aortic retroperitoneal space. Lymphoma or metastatic disease would be primary considerations. 2. Hepatomegaly with borderline splenomegaly. Prominent portal and splenic veins raise the question of portal venous hypertension. 3. Gallbladder sludge or tiny stones. 4. Left chest wall and upper abdominal wall edema. 5. Large hiatal hernia with bilateral pleural effusions, similar to prior. 6.  Aortic Atherosclerois (ICD10-170.0) Electronically Signed   By: EMisty StanleyM.D.   On: 08/31/2018 13:13   Dg Chest Port 1 View  Result Date: 09/21/2018 CLINICAL DATA:  Tachypnea. EXAM: PORTABLE CHEST 1 VIEW COMPARISON:  Chest radiographs 2 days prior, chest CT [id number] FINDINGS: Right-sided pacemaker in place. Right upper extremity PICC in place, tip in the proximal SVC. The patient is significantly rotated. Bilateral pleural effusions and associated airspace disease, likely progressed at the left lung base. Nodular density in the periphery of the left lung again seen. Large retrocardiac hiatal hernia. Unchanged heart size and mediastinal contours. No pulmonary edema. IMPRESSION: Bilateral pleural effusions, increased on the left. Unchanged radiographic appearance of large hiatal hernia. Peripheral nodular opacity as seen on prior chest CT. Additional pulmonary nodules on CT are not visualized radiographically Electronically Signed   By: MKeith RakeM.D.   On: 09/21/2018 03:42   Dg Chest Port 1  View  Addendum Date: 09/14/2018   ADDENDUM REPORT: 09/14/2018 09:14 ADDENDUM: The impression should read: Stable chest x-ray demonstrating pulmonary edema and  bilateral pleural effusions. Electronically Signed   By: Aletta Edouard M.D.   On: 09/14/2018 09:14   Result Date: 09/14/2018 CLINICAL DATA:  Shortness of breath. History of progressive high-grade B-cell lymphoma. EXAM: PORTABLE CHEST 1 VIEW COMPARISON:  Chest x-ray on 09/09/2018 and CT of the chest on 128-Jun-202019. FINDINGS: Stable heart size, appearance of pacemaker and large hiatal hernia. Stable probable bilateral pleural effusions and bilateral pulmonary nodules. No overt pulmonary edema. No pneumothorax. IMPRESSION: Stable chest x-ray demonstrating pulmonary lymphoma and bilateral pleural effusions. Electronically Signed: By: Aletta Edouard M.D. On: 09/11/2018 14:45   Dg Chest Port 1 View  Result Date: 09/14/2018 CLINICAL DATA:  Shortness of breath. EXAM: PORTABLE CHEST 1 VIEW COMPARISON:  Radiographs 09/11/2018 and 09/09/2018. FINDINGS: 0523 hours. Mild patient rotation to the right. The right arm PICC and right subclavian pacemaker leads appear unchanged. The heart size and mediastinal contours are stable. There is a large hiatal hernia. There are persistent bilateral pleural effusions which appear mildly improved on the right. There is no pneumothorax or confluent airspace opacity. IMPRESSION: No acute findings demonstrated. The right pleural effusion appears slightly smaller. Electronically Signed   By: Richardean Sale M.D.   On: 09/14/2018 09:10   Dg Chest Port 1 View  Result Date: 09/09/2018 CLINICAL DATA:  Hypoxia, history of progressive lymphoma EXAM: PORTABLE CHEST 1 VIEW COMPARISON:  128-Jun-202019 FINDINGS: Cardiac shadow is enlarged. Pacing device is again seen. Bilateral pleural effusions are again noted. The nodular changes seen on recent chest CT are not as well appreciated on today's exam. No pneumothorax is noted. IMPRESSION:  Bilateral effusions. Patchy densities are noted consistent findings of recent CT although less well appreciated on today's exam. Electronically Signed   By: Inez Catalina M.D.   On: 09/09/2018 06:35   Dg Chest Portable 1 View  Result Date: 08/29/2018 CLINICAL DATA:  72 year old female with a history of shortness of breath and edema EXAM: PORTABLE CHEST 1 VIEW COMPARISON:  04/19/2018, 04/18/2018 FINDINGS: Cardiomediastinal silhouette unchanged in size and contour. Double density projects over the lower mediastinum is unchanged. On the frontal view there is a Passy at the left base obscuring the left hemidiaphragm with blunting of left costophrenic angle, increased from the prior. No pneumothorax. Unchanged right chest wall cardiac pacing device with 2 leads in place. No confluent airspace disease on the right. IMPRESSION: New opacity at the left lung base obscuring left hemidiaphragm in the left heart border, compatible with pleural effusion and associated atelectasis/consolidation. Hiatal hernia. Electronically Signed   By: Corrie Mckusick D.O.   On: 08/29/2018 09:09   Dg Abd Portable 1v  Result Date: 09/13/2018 CLINICAL DATA:  Constipation EXAM: PORTABLE ABDOMEN - 1 VIEW COMPARISON:  CT 08/31/2018 FINDINGS: Nonobstructed bowel gas pattern. Residual contrast within the colon. Mild to moderate feces retention at the rectum. IMPRESSION: Nonobstructed gas pattern with mild to moderate fecal retention at the rectum Electronically Signed   By: Donavan Foil M.D.   On: 09/13/2018 23:51   Dg Swallowing Func-speech Pathology  Result Date: 09/15/2018 Objective Swallowing Evaluation: Type of Study: MBS-Modified Barium Swallow Study  Patient Details Name: Patricia Murillo MRN: [id number] Date of Birth: Aug 21, 1946 Today's Date: 09/15/2018 Time: SLP Start Time (ACUTE ONLY): 1210 -SLP Stop Time (ACUTE ONLY): 1240 SLP Time Calculation (min) (ACUTE ONLY): 30 min Past Medical History: Past Medical History: Diagnosis Date  . Arthritis  . Hiatal hernia  . History of chicken pox  . Hyperlipidemia  . Pacemaker  Past Surgical History: Past  Surgical History: Procedure Laterality Date . ANKLE SURGERY   . PACEMAKER IMPLANT N/A 04/18/2018  Procedure: PACEMAKER IMPLANT;  Surgeon: Evans Lance, MD;  Location: [facility];  Service: Cardiovascular;  Laterality: N/A; HPI: Patricia Murillo is a 72 y.o. 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  During her hospitalization she underwent left axillary lymph node biopsy.  She was discharged home in a stable condition, but apparently she has been developing worsening weakness, generalized, worse with exertion, no improving factors, associated with malaise, chills and poor appetite.  Over the last 24 hours her symptoms have been more severe to the point where she has difficulty standing. She has persistent left upper extremity edema which has been painful, sharp in nature up to 10 out of 10 intensity.  Her weakness has led to dyspnea which has been severe in intensity, and triggered her emergency room visit today.Pt was made NPO yesterday due to concerns for aspiration.   Today pt is alert, has congested weak cough concerrning for airway protection.   Pt underwent MBS yesterday and was placed on full liquid diet with precautions. RN reports pt continues to cough with po intake.  Per note from MD, now plan is for pt to have small bore feeding tube placed for nutritional support.  Unfortunately pt did not tolerate attempt at insertion of feeding tube.  Repeat swallow evaluation ordered.  SLP follow up to inform family/pt of plan and determine if clinically pt appears improved.    Subjective: Alert Assessment / Plan / Recommendation CHL IP CLINICAL IMPRESSIONS 09/15/2018 Clinical Impression Patient had a MBS on 09/12/18 and patient presented with Moderately  severe oropharyngeal dysphagia with sensorimotor deficits. She presents much stronger today and presents with a mild oropharyngeal dysphagia. Oral preparation and bolus transfer was mild evidenced by trace oral residue. Patient initiated swallow at the level of the pyriform sinuses with no penetration or aspiration with cup sips of all sizes. She did have penetration with cup sip with straw, but spontaneously cleared layngeal vestibule. She initiated swallow at the valleculae with puree and cracker bolus, no penetration or aspiration. Significant pharyngeal residue present after 1st swallow, but improved mostly cleared with second spontaneous swallow. Upper 1/3 of esphagus observed to clear well with puree and tablet. Due to significant sensorimotor improvement, recommend patient have regular diet with thin liquids, medication whole in puree. Speech therapy to follow up for continue diet tolerance and swallowing therapy. SLP Visit Diagnosis Dysphagia, oropharyngeal phase (R13.12) Attention and concentration deficit following -- Frontal lobe and executive function deficit following -- Impact on safety and function Mild aspiration risk   CHL IP TREATMENT RECOMMENDATION 09/15/2018 Treatment Recommendations Therapy as outlined in treatment plan below   Prognosis 09/15/2018 Prognosis for Safe Diet Advancement Good Barriers to Reach Goals -- Barriers/Prognosis Comment -- CHL IP DIET RECOMMENDATION 09/15/2018 SLP Diet Recommendations Regular solids;Thin liquid Liquid Administration via Cup;No straw Medication Administration Whole meds with liquid Compensations Slow rate;Small sips/bites;Other (Comment) Postural Changes Remain semi-upright after after feeds/meals (Comment);Seated upright at 90 degrees   CHL IP OTHER RECOMMENDATIONS 09/15/2018 Recommended Consults -- Oral Care Recommendations Oral care before and after PO Other Recommendations --   CHL IP FOLLOW UP RECOMMENDATIONS 09/14/2018 Follow up Recommendations (No  Data)   CHL IP FREQUENCY AND DURATION 09/15/2018 Speech Therapy Frequency (ACUTE ONLY) min 2x/week Treatment Duration 1 week  CHL IP ORAL PHASE 09/15/2018 Oral Phase Impaired Oral - Pudding Teaspoon -- Oral - Pudding Cup Lingual/palatal residue Oral - Honey Teaspoon -- Oral - Honey Cup -- Oral - Nectar Teaspoon NT Oral - Nectar Cup NT Oral - Nectar Straw NT Oral - Thin Teaspoon Lingual/palatal residue Oral - Thin Cup Lingual/palatal residue;Impaired mastication Oral - Thin Straw Lingual/palatal residue Oral - Puree Lingual/palatal residue Oral - Mech Soft Lingual/palatal residue Oral - Regular -- Oral - Multi-Consistency -- Oral - Pill -- Oral Phase - Comment --  CHL IP PHARYNGEAL PHASE 09/15/2018 Pharyngeal Phase Impaired Pharyngeal- Pudding Teaspoon Delayed swallow initiation-vallecula;Pharyngeal residue - valleculae;Pharyngeal residue - pyriform Pharyngeal -- Pharyngeal- Pudding Cup NT Pharyngeal -- Pharyngeal- Honey Teaspoon -- Pharyngeal -- Pharyngeal- Honey Cup -- Pharyngeal -- Pharyngeal- Nectar Teaspoon NT Pharyngeal -- Pharyngeal- Nectar Cup NT Pharyngeal -- Pharyngeal- Nectar Straw NT Pharyngeal -- Pharyngeal- Thin Teaspoon Delayed swallow initiation-pyriform sinuses;Reduced pharyngeal peristalsis;Reduced anterior laryngeal mobility;Reduced laryngeal elevation;Reduced airway/laryngeal closure;Reduced tongue base retraction;Pharyngeal residue - valleculae;Pharyngeal residue - pyriform Pharyngeal -- Pharyngeal- Thin Cup Delayed swallow initiation-pyriform sinuses;Reduced anterior laryngeal mobility;Reduced laryngeal elevation;Reduced airway/laryngeal closure;Reduced tongue base retraction;Pharyngeal residue - valleculae;Pharyngeal residue - pyriform;Reduced pharyngeal peristalsis Pharyngeal -- Pharyngeal- Thin Straw Delayed swallow initiation-pyriform sinuses;Reduced anterior laryngeal mobility;Reduced laryngeal elevation;Reduced airway/laryngeal closure;Reduced pharyngeal peristalsis;Reduced tongue base  retraction;Pharyngeal residue - valleculae;Pharyngeal residue - pyriform;Penetration/Aspiration during swallow Pharyngeal -- Pharyngeal- Puree Delayed swallow initiation-vallecula;Reduced laryngeal elevation;Reduced airway/laryngeal closure;Reduced tongue base retraction;Pharyngeal residue - valleculae Pharyngeal -- Pharyngeal- Mechanical Soft Delayed swallow initiation-vallecula;Reduced laryngeal elevation;Reduced airway/laryngeal closure;Reduced tongue base retraction Pharyngeal -- Pharyngeal- Regular NT Pharyngeal -- Pharyngeal- Multi-consistency -- Pharyngeal -- Pharyngeal- Pill Delayed swallow initiation-vallecula;Reduced laryngeal elevation;Reduced airway/laryngeal closure Pharyngeal -- Pharyngeal Comment --  CHL IP CERVICAL ESOPHAGEAL PHASE 09/15/2018 Cervical Esophageal Phase WFL Pudding Teaspoon -- Pudding Cup -- Honey Teaspoon -- Honey Cup -- Nectar Teaspoon -- Nectar Cup -- Nectar Straw -- Thin Teaspoon -- Thin Cup -- Thin Straw -- Puree -- Mechanical Soft -- Regular -- Multi-consistency -- Pill -- Cervical Esophageal Comment -- Charlynne Cousins Ward, MA, CCC-SLP 09/15/2018 2:59 PM              Dg Swallowing Func-speech Pathology  Result Date: 09/12/2018 Objective Swallowing Evaluation: Type of Study: MBS-Modified Barium Swallow Study  Patient Details Name: Patricia Murillo MRN: [id number] Date of Birth: 1945-11-06 Today's Date: 09/12/2018 Time: SLP Start Time (ACUTE ONLY): 1300 -SLP Stop Time (ACUTE ONLY): 1337 SLP Time Calculation (min) (ACUTE ONLY): 37 min Past Medical History: Past Medical History: Diagnosis Date . Arthritis  . Hiatal hernia  . History of chicken pox  . Hyperlipidemia  . Pacemaker  Past Surgical History: Past Surgical History: Procedure Laterality Date . ANKLE SURGERY   . PACEMAKER IMPLANT N/A 04/18/2018  Procedure: PACEMAKER IMPLANT;  Surgeon: Evans Lance, MD;  Location: [facility];  Service: Cardiovascular;  Laterality: N/A; HPI: Patricia Murillo is a 72 y.o. 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  During her hospitalization she underwent left axillary lymph node biopsy.  She was discharged home in a stable condition, but apparently she has been developing worsening weakness, generalized, worse with exertion, no improving factors, associated with malaise, chills and poor appetite.  Over the last 24 hours her symptoms have been more severe to the point where she has difficulty standing. She has persistent left upper extremity edema which has been painful,  sharp in nature up to 10 out of 10 intensity.  Her weakness has led to dyspnea which has been severe in intensity, and triggered her emergency room visit today.Pt was made NPO yesterday due to concerns for aspiration.   Today pt is alert, has congested weak cough concerrning for airway protection.  Subjective: The patient was seen sitting upright in bed with her daughter at the bedside.  Assessment / Plan / Recommendation CHL IP CLINICAL IMPRESSIONS 09/12/2018 Clinical Impression Pt presents with moderately severe oropharyngeal dysphagia with sensorimotor deficits.  Decreased oral propulsion and weak pharyngeal motility results in significant pharyngeal more than oral residuals.  Pt does NOT sense residuals and is unable to "hock" to expectorate to clear them.  Chin tuck, head turn postures did NOT decrease residuals.  Pt did aspirate with thin liquids posterior trachea with delayed cough response.  Although she did not aspirate with thicker consistencies, increased residuals present which if aspiration post-swallow are more difficult for pulmonary clearance.  Pt is grossly weak at this time - uncertain of her baseline swallow function.  Pt is a high aspiration/malnutrition risk due to level of dysphagia and weak nonproductive cough.  If she is to consume po intake= it should be with  accepted risks and mitigation strategies.  Question if pt's swallow with improve with improved medical status. SLP Visit Diagnosis Dysphagia, oropharyngeal phase (R13.12) Attention and concentration deficit following -- Frontal lobe and executive function deficit following -- Impact on safety and function Severe aspiration risk;Risk for inadequate nutrition/hydration   CHL IP TREATMENT RECOMMENDATION 09/11/2018 Treatment Recommendations Therapy as outlined in treatment plan below   Prognosis 09/12/2018 Prognosis for Safe Diet Advancement Fair Barriers to Reach Goals Severity of deficits;Cognitive deficits;Other (Comment) Barriers/Prognosis Comment -- CHL IP DIET RECOMMENDATION 09/12/2018 SLP Diet Recommendations (No Data) Liquid Administration via Cup Medication Administration Crushed with puree Compensations Slow rate;Small sips/bites;Follow solids with liquid Postural Changes Remain semi-upright after after feeds/meals (Comment);Seated upright at 90 degrees   CHL IP OTHER RECOMMENDATIONS 09/12/2018 Recommended Consults -- Oral Care Recommendations Oral care before and after PO Other Recommendations Have oral suction available   CHL IP FOLLOW UP RECOMMENDATIONS 09/12/2018 Follow up Recommendations (No Data)   CHL IP FREQUENCY AND DURATION 09/12/2018 Speech Therapy Frequency (ACUTE ONLY) min 2x/week Treatment Duration 1 week      CHL IP ORAL PHASE 09/12/2018 Oral Phase Impaired Oral - Pudding Teaspoon -- Oral - Pudding Cup -- Oral - Honey Teaspoon -- Oral - Honey Cup -- Oral - Nectar Teaspoon Weak lingual manipulation;Reduced posterior propulsion;Lingual/palatal residue Oral - Nectar Cup Weak lingual manipulation;Reduced posterior propulsion;Lingual/palatal residue Oral - Nectar Straw Weak lingual manipulation;Reduced posterior propulsion;Lingual/palatal residue Oral - Thin Teaspoon Weak lingual manipulation;Reduced posterior propulsion Oral - Thin Cup Weak lingual manipulation;Reduced posterior propulsion Oral - Thin  Straw Reduced posterior propulsion Oral - Puree Weak lingual manipulation;Reduced posterior propulsion Oral - Mech Soft Weak lingual manipulation;Reduced posterior propulsion Oral - Regular -- Oral - Multi-Consistency -- Oral - Pill -- Oral Phase - Comment --  CHL IP PHARYNGEAL PHASE 09/12/2018 Pharyngeal Phase Impaired Pharyngeal- Pudding Teaspoon -- Pharyngeal -- Pharyngeal- Pudding Cup -- Pharyngeal -- Pharyngeal- Honey Teaspoon -- Pharyngeal -- Pharyngeal- Honey Cup -- Pharyngeal -- Pharyngeal- Nectar Teaspoon Reduced pharyngeal peristalsis;Reduced epiglottic inversion;Reduced tongue base retraction;Pharyngeal residue - valleculae;Pharyngeal residue - pyriform Pharyngeal -- Pharyngeal- Nectar Cup Reduced pharyngeal peristalsis;Reduced epiglottic inversion;Reduced laryngeal elevation;Reduced airway/laryngeal closure;Reduced tongue base retraction;Pharyngeal residue - valleculae;Pharyngeal residue - pyriform;Penetration/Aspiration during swallow Pharyngeal Material enters airway, remains ABOVE vocal cords  and not ejected out Pharyngeal- Nectar Straw Reduced pharyngeal peristalsis;Reduced epiglottic inversion;Reduced anterior laryngeal mobility;Reduced airway/laryngeal closure;Reduced tongue base retraction;Reduced laryngeal elevation;Pharyngeal residue - valleculae;Pharyngeal residue - pyriform Pharyngeal -- Pharyngeal- Thin Teaspoon Reduced pharyngeal peristalsis;Reduced epiglottic inversion;Reduced laryngeal elevation;Reduced airway/laryngeal closure;Reduced tongue base retraction;Pharyngeal residue - valleculae;Pharyngeal residue - pyriform Pharyngeal -- Pharyngeal- Thin Cup Reduced pharyngeal peristalsis;Reduced epiglottic inversion;Reduced laryngeal elevation;Reduced airway/laryngeal closure;Reduced tongue base retraction;Pharyngeal residue - valleculae;Pharyngeal residue - pyriform;Penetration/Aspiration during swallow Pharyngeal Material enters airway, passes BELOW cords without attempt by patient to eject  out (silent aspiration) Pharyngeal- Thin Straw Reduced pharyngeal peristalsis;Reduced epiglottic inversion;Reduced laryngeal elevation;Reduced airway/laryngeal closure;Reduced tongue base retraction;Pharyngeal residue - valleculae;Pharyngeal residue - pyriform Pharyngeal Material enters airway, passes BELOW cords and not ejected out despite cough attempt by patient Pharyngeal- Puree Reduced epiglottic inversion;Reduced pharyngeal peristalsis;Reduced tongue base retraction;Reduced airway/laryngeal closure;Reduced laryngeal elevation;Pharyngeal residue - valleculae;Pharyngeal residue - pyriform Pharyngeal -- Pharyngeal- Mechanical Soft Reduced pharyngeal peristalsis;Reduced epiglottic inversion;Reduced tongue base retraction;Pharyngeal residue - valleculae;Reduced airway/laryngeal closure Pharyngeal -- Pharyngeal- Regular -- Pharyngeal -- Pharyngeal- Multi-consistency -- Pharyngeal -- Pharyngeal- Pill -- Pharyngeal -- Pharyngeal Comment --  CHL IP CERVICAL ESOPHAGEAL PHASE 09/12/2018 Cervical Esophageal Phase Impaired Pudding Teaspoon -- Pudding Cup -- Honey Teaspoon -- Honey Cup -- Nectar Teaspoon -- Nectar Cup -- Nectar Straw -- Thin Teaspoon -- Thin Cup -- Thin Straw -- Puree -- Mechanical Soft -- Regular -- Multi-consistency -- Pill -- Cervical Esophageal Comment upon esophageal sweep, pt appeared clear Luanna Salk, MS [facility] SLP Acute Rehab Services Pager [phone] Office [phone] Macario Golds 09/12/2018, 5:29 PM              [place] Core Biopsy (lymph Nodes)  Result Date: 08/30/2018 INDICATION: 72 year old with scattered lung lesions, left arm swelling and extensive left axillary lymphadenopathy. Findings are concerning for metastatic disease and tissue diagnosis is needed. EXAM: ULTRASOUND-GUIDED CORE BIOPSY OF LEFT AXILLARY LYMPH NODE MEDICATIONS: None. ANESTHESIA/SEDATION: Moderate (conscious) sedation was employed during this procedure. A total of Versed 0.5 mg and Fentanyl 50 mcg was  administered intravenously. Moderate Sedation Time: 15 minutes. The patient's level of consciousness and vital signs were monitored continuously by radiology nursing throughout the procedure under my direct supervision. FLUOROSCOPY TIME:  None COMPLICATIONS: None immediate. PROCEDURE: Informed written consent was obtained from the patient after a thorough discussion of the procedural risks, benefits and alternatives. All questions were addressed. A timeout was performed prior to the initiation of the procedure. Ultrasound demonstrated enlarged left axillary lymph nodes. The left axilla was prepped and draped in sterile fashion. Skin was anesthetized with 1% lidocaine. 18 gauge core biopsies were obtained of a round enlarged lymph node with ultrasound guidance. Total of 6 core biopsies were obtained. Specimens placed in saline. Bandage placed over the puncture site. FINDINGS: Multiple enlarged hypoechoic lymph nodes in left axilla. Biopsy needle confirmed within the targeted lymph node. No bleeding or hematoma formation at end of procedure. IMPRESSION: Ultrasound-guided core biopsy of an enlarged left axillary lymph node. Electronically Signed   By: Markus Daft M.D.   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 Limitations: Body habitus, swelling and poor ultrasound/tissue interface. Comparison Study: No prior study on file for comparison. Performing Technologist: Sharion Dove RVS  Examination Guidelines: A complete evaluation includes B-mode imaging, spectral Doppler, color Doppler, and power Doppler as needed of all accessible portions of each vessel.  Bilateral testing is considered an integral part of a complete examination. Limited examinations for reoccurring indications may be performed as noted.  Right Findings:  +----------+------------+----------+---------+-----------+-------+ RIGHT     CompressiblePropertiesPhasicitySpontaneousSummary +----------+------------+----------+---------+-----------+-------+ Subclavian                         Yes       Yes            +----------+------------+----------+---------+-----------+-------+  Left Findings: +----------+------------+----------+---------+-----------+-------+ LEFT      CompressiblePropertiesPhasicitySpontaneousSummary +----------+------------+----------+---------+-----------+-------+ IJV                                Yes       Yes            +----------+------------+----------+---------+-----------+-------+ Subclavian                         Yes       Yes            +----------+------------+----------+---------+-----------+-------+ Axillary      Full                                          +----------+------------+----------+---------+-----------+-------+ Brachial      Full                                          +----------+------------+----------+---------+-----------+-------+ Cephalic      Full                                          +----------+------------+----------+---------+-----------+-------+ Basilic       Full                                          +----------+------------+----------+---------+-----------+-------+  Summary:  Right: No evidence of thrombosis in the subclavian.  Left: No evidence of deep vein thrombosis in the upper extremity. However, unable to visualize the radial and ulnar. No evidence of superficial vein thrombosis in the upper extremity. However, unable to visualize the radial and ulnar. No evidence of thrombosis in the . However, unable to visualize the radial and ulnar. This was a limited study.  *See table(s) above for measurements and observations.  Diagnosing physician: Deitra Mayo MD Electronically signed by Deitra Mayo MD on 08/29/2018 at 4:20:56 PM.     Final    Ir Picc Placement Right >5 Yrs Inc Img Guide  Result Date: 09/09/2018 INDICATION: Acute lymphoma, access for chemotherapy as an inpatient EXAM: ULTRASOUND AND FLUOROSCOPIC GUIDED PICC LINE INSERTION MEDICATIONS: 1% lidocaine local CONTRAST:  None FLUOROSCOPY TIME:  Twenty-four seconds (8 mGy) COMPLICATIONS: None immediate. TECHNIQUE: The procedure, risks, benefits, and alternatives were explained to the patient and informed written consent was obtained. A timeout was performed prior to the initiation of the procedure. The right upper extremity was prepped with chlorhexidine in a sterile fashion, and a sterile drape was applied covering the operative field. Maximum barrier sterile technique with sterile gowns and gloves were used for the procedure. A timeout  was performed prior to the initiation of the procedure. Local anesthesia was provided with 1% lidocaine. Under direct ultrasound guidance, the right brachial vein was accessed with a micropuncture kit after the overlying soft tissues were anesthetized with 1% lidocaine. An ultrasound image was saved for documentation purposes. A guidewire was advanced to the level of the superior caval-atrial junction for measurement purposes and the PICC line was cut to length. A peel-away sheath was placed and a 36 cm, 5 [place], dual lumen was inserted to level of the superior caval-atrial junction. A post procedure spot fluoroscopic was obtained. The catheter easily aspirated and flushed and was sutured in place. A dressing was placed. The patient tolerated the procedure well without immediate post procedural complication. FINDINGS: After catheter placement, the tip lies within the superior cavoatrial junction. The catheter aspirates and flushes normally and is ready for immediate use. IMPRESSION: Successful ultrasound and fluoroscopic guided placement of a right brachial vein approach, 36 cm, 5 French, dual lumen PICC with tip at the superior caval-atrial  junction. The PICC line is ready for immediate use. Electronically Signed   By: Jerilynn Mages.  Shick M.D.   On: 09/09/2018 09:23   [place] Ekg Site Rite  Result Date: 09/08/2018 If Site Rite image not attached, placement could not be confirmed due to current cardiac rhythm.    LOS: 18 days   Oren Binet, MD  [facility]  If 7PM-7AM, please contact night-coverage  Please page via [email or web address]-click on MD name and type text message  09/25/2018, 12:12 PM

## 2018-09-25 NOTE — Progress Notes (Signed)
Rt gave pt flutter valve. Pt knows and understands how to use. 

## 2018-09-26 LAB — CBC
HCT: 34.3 % — ABNORMAL LOW (ref 36.0–46.0)
HEMOGLOBIN: 10.9 g/dL — AB (ref 12.0–15.0)
MCH: 28 pg (ref 26.0–34.0)
MCHC: 31.8 g/dL (ref 30.0–36.0)
MCV: 88.2 fL (ref 80.0–100.0)
Platelets: 272 10*3/uL (ref 150–400)
RBC: 3.89 MIL/uL (ref 3.87–5.11)
RDW: 16.5 % — ABNORMAL HIGH (ref 11.5–15.5)
WBC: 10 10*3/uL (ref 4.0–10.5)
nRBC: 0 % (ref 0.0–0.2)

## 2018-09-26 LAB — BASIC METABOLIC PANEL
Anion gap: 8 (ref 5–15)
BUN: 11 mg/dL (ref 8–23)
CO2: 24 mmol/L (ref 22–32)
Calcium: 7.5 mg/dL — ABNORMAL LOW (ref 8.9–10.3)
Chloride: 103 mmol/L (ref 98–111)
Creatinine, Ser: 0.33 mg/dL — ABNORMAL LOW (ref 0.44–1.00)
GFR calc Af Amer: >60 mL/min (ref >60)
GFR calc non Af Amer: >60 mL/min (ref >60)
Glucose, Bld: 99 mg/dL (ref 70–99)
Potassium: 3.6 mmol/L (ref 3.5–5.1)
Sodium: 135 mmol/L (ref 135–145)

## 2018-09-26 LAB — CULTURE, BLOOD (ROUTINE X 2): Culture: NO GROWTH

## 2018-09-26 LAB — MAGNESIUM: MAGNESIUM: 1.9 mg/dL (ref 1.7–2.4)

## 2018-09-26 MED ORDER — LACTINEX PO CHEW
1.0000 | CHEWABLE_TABLET | Freq: Three times a day (TID) | ORAL | Status: DC
  Administered 2018-09-27 – 2018-10-04 (×18): 1 via ORAL
  Filled 2018-09-26 (×28): qty 1

## 2018-09-26 MED ORDER — POTASSIUM CHLORIDE CRYS ER 20 MEQ PO TBCR
40.0000 meq | EXTENDED_RELEASE_TABLET | Freq: Once | ORAL | Status: AC
  Administered 2018-09-26: 40 meq via ORAL
  Filled 2018-09-26: qty 2

## 2018-09-26 MED ORDER — BACID PO TABS
2.0000 | ORAL_TABLET | Freq: Three times a day (TID) | ORAL | Status: DC
  Filled 2018-09-26 (×5): qty 2

## 2018-09-26 NOTE — Progress Notes (Signed)
Patricia Murillo   DOB:Apr 18, 1946   QT#:[id number]   [phone]  Hem/Onc follow up note   Subjective: Pt remains to be very weak.  She was able to sit in the chair, but has not walked with physical therapist.  She is afebrile, vital signs stable, appetite is decent, her blood counts has recovered well.  No other new complaints.  Objective:  Vitals:   09/26/18 0843 09/26/18 1412  BP:  (!) 148/77  Pulse:  92  Resp:  14  Temp:  98.3 F (36.8 C)  SpO2: 96% 94%    Body mass index is 38.9 kg/m.  Intake/Output Summary (Last 24 hours) at 09/26/2018 1756 Last data filed at 09/26/2018 1540 Gross per 24 hour  Intake 857.08 ml  Output -  Net 857.08 ml     Sclerae unicteric  Oropharynx clear  (+) bulky adenopathy at left axilla and left upper front chest have overall improved   Lungs clear -- scatter wheezing and rhonchi through both lungs   Heart regular rate and rhythm  Abdomen benign, soft   MSK no focal spinal tenderness, (+) severe edema of the left upper arm and leg, and bilateral lower extremity  Neuro: alert, and follow commands. Pupil right bigger than left, both reactive, mild (+) ptosis of left eye. Her left upper extremity strength is significantly reduced. She moves all extremities    CBG (last 3)  No results for input(s): GLUCAP in the last 72 hours.   Labs:  CBC Latest Ref Rng & Units 09/26/2018 09/25/2018 09/24/2018  WBC 4.0 - 10.5 K/uL 10.0 9.5 12.9(H)  Hemoglobin 12.0 - 15.0 g/dL 10.9(L) 10.7(L) 11.0(L)  Hematocrit 36.0 - 46.0 % 34.3(L) 33.4(L) 33.9(L)  Platelets 150 - 400 K/uL 272 347 354    CMP Latest Ref Rng & Units 09/26/2018 09/25/2018 09/22/2018  Glucose 70 - 99 mg/dL 99 80 89  BUN 8 - 23 mg/dL 11 10 16   Creatinine 0.44 - 1.00 mg/dL 0.33(L) 0.41(L) 0.41(L)  Sodium 135 - 145 mmol/L 135 137 137  Potassium 3.5 - 5.1 mmol/L 3.6 3.2(L) 3.1(L)  Chloride 98 - 111 mmol/L 103 103 106  CO2 22 - 32 mmol/L 24 24 27   Calcium 8.9 - 10.3 mg/dL 7.5(L) 7.8(L)  7.9(L)  Total Protein 6.5 - 8.1 g/dL - 4.5(L) 4.6(L)  Total Bilirubin 0.3 - 1.2 mg/dL - 0.6 1.9(H)  Alkaline Phos 38 - 126 U/L - 84 57  AST 15 - 41 U/L - 17 9(L)  ALT 0 - 44 U/L - 18 14     Urine Studies No results for input(s): UHGB, CRYS in the last 72 hours.  Invalid input(s): UACOL, UAPR, USPG, UPH, UTP, UGL, UKET, UBIL, UNIT, UROB, [place], UEPI, UWBC, Duwayne Heck [place], [place]  Basic Metabolic Panel: Recent Labs  Lab 09/20/18 0450 09/21/18 0607 09/22/18 0546 09/25/18 0523 09/26/18 0511  NA 135 133* 137 137 135  K 3.8 3.5 3.1* 3.2* 3.6  CL 98 94* 106 103 103  CO2 29 29 27 24 24   GLUCOSE 110* 96 89 80 99  BUN 13 13 16 10 11   CREATININE 0.36* 0.46 0.41* 0.41* 0.33*  CALCIUM 8.4* 7.7* 7.9* 7.8* 7.5*  MG  --   --   --   --  1.9   GFR Estimated Creatinine Clearance: 80.7 mL/min (A) (by C-G formula based on SCr of 0.33 mg/dL (L)). Liver Function Tests: Recent Labs  Lab 09/20/18 0450 09/22/18 0546 09/25/18 0523  AST 16 9* 17  ALT  18 14 18   ALKPHOS 61 57 84  BILITOT 2.1* 1.9* 0.6  PROT 5.0* 4.6* 4.5*  ALBUMIN 3.2* 2.4* 2.4*   No results for input(s): LIPASE, AMYLASE in the last 168 hours. No results for input(s): AMMONIA in the last 168 hours. Coagulation profile No results for input(s): INR, PROTIME in the last 168 hours.  CBC: Recent Labs  Lab 09/21/18 0607 09/22/18 0546 09/23/18 0950 09/24/18 0648 09/25/18 0523 09/26/18 0511  WBC 0.4* 2.2* 12.7* 12.9* 9.5 10.0  NEUTROABS 0.1* 1.6* 11.0* 10.2* 7.3  --   HGB 8.0* 7.0* 10.5* 11.0* 10.7* 10.9*  HCT 24.5* 22.1* 31.9* 33.9* 33.4* 34.3*  MCV 87.5 89.5 87.6 88.1 87.4 88.2  PLT 111* 129* 239 354 347 272   Cardiac Enzymes: No results for input(s): CKTOTAL, CKMB, CKMBINDEX, TROPONINI in the last 168 hours. BNP: Invalid input(s): POCBNP CBG: No results for input(s): GLUCAP in the last 168 hours. D-Dimer No results for input(s): DDIMER in the last 72 hours. Hgb A1c No results for input(s): HGBA1C in  the last 72 hours. Lipid Profile No results for input(s): CHOL, HDL, LDLCALC, TRIG, CHOLHDL, LDLDIRECT in the last 72 hours. Thyroid function studies No results for input(s): TSH, T4TOTAL, T3FREE, THYROIDAB in the last 72 hours.  Invalid input(s): FREET3 Anemia work up No results for input(s): VITAMINB12, FOLATE, FERRITIN, TIBC, IRON, RETICCTPCT in the last 72 hours. Microbiology Recent Results (from the past 240 hour(s))  Culture, blood (routine x 2)     Status: Abnormal   Collection Time: 09/21/18  8:13 AM  Result Value Ref Range Status   Specimen Description   Final    BLOOD RIGHT ANTECUBITAL Performed at [facility], [place] [address] [place], [place] [address]    Special Requests   Final    BOTTLES DRAWN AEROBIC AND ANAEROBIC Blood Culture adequate volume Performed at [facility] [address] [place], [place] [address]    Culture  Setup Time   Final    GRAM NEGATIVE RODS AEROBIC BOTTLE ONLY CRITICAL RESULT CALLED TO, READ BACK BY AND VERIFIED WITHLavell Luster [facility] 9476 09/22/18 A BROWNING Performed at [facility], [place] [address], [place], [place] [address]    Culture PSEUDOMONAS AERUGINOSA (A)  Final   Report Status 09/24/2018 FINAL  Final   Organism ID, Bacteria PSEUDOMONAS AERUGINOSA  Final      Susceptibility   Pseudomonas aeruginosa - MIC*    CEFTAZIDIME 4 SENSITIVE Sensitive     CIPROFLOXACIN <=0.25 SENSITIVE Sensitive     GENTAMICIN <=1 SENSITIVE Sensitive     IMIPENEM 2 SENSITIVE Sensitive     PIP/TAZO <=4 SENSITIVE Sensitive     CEFEPIME 4 SENSITIVE Sensitive     * PSEUDOMONAS AERUGINOSA  Blood Culture ID Panel (Reflexed)     Status: Abnormal   Collection Time: 09/21/18  8:13 AM  Result Value Ref Range Status   Enterococcus species NOT DETECTED NOT DETECTED Final   Listeria monocytogenes NOT DETECTED NOT DETECTED Final   Staphylococcus species NOT DETECTED NOT DETECTED Final   Staphylococcus aureus (BCID) NOT DETECTED  NOT DETECTED Final   Streptococcus species NOT DETECTED NOT DETECTED Final   Streptococcus agalactiae NOT DETECTED NOT DETECTED Final   Streptococcus pneumoniae NOT DETECTED NOT DETECTED Final   Streptococcus pyogenes NOT DETECTED NOT DETECTED Final   Acinetobacter baumannii NOT DETECTED NOT DETECTED Final   Enterobacteriaceae species NOT DETECTED NOT DETECTED Final   Enterobacter cloacae complex NOT DETECTED NOT DETECTED Final  Escherichia coli NOT DETECTED NOT DETECTED Final   Klebsiella oxytoca NOT DETECTED NOT DETECTED Final   Klebsiella pneumoniae NOT DETECTED NOT DETECTED Final   Proteus species NOT DETECTED NOT DETECTED Final   Serratia marcescens NOT DETECTED NOT DETECTED Final   Carbapenem resistance NOT DETECTED NOT DETECTED Final   Haemophilus influenzae NOT DETECTED NOT DETECTED Final   Neisseria meningitidis NOT DETECTED NOT DETECTED Final   Pseudomonas aeruginosa DETECTED (A) NOT DETECTED Final    Comment: CRITICAL RESULT CALLED TO, READ BACK BY AND VERIFIED WITHLavell Luster [facility] 9470 09/22/18 A BROWNING    Candida albicans NOT DETECTED NOT DETECTED Final   Candida glabrata NOT DETECTED NOT DETECTED Final   Candida krusei NOT DETECTED NOT DETECTED Final   Candida parapsilosis NOT DETECTED NOT DETECTED Final   Candida tropicalis NOT DETECTED NOT DETECTED Final    Comment: Performed at [facility], [place] [address], [place], [place] [address]  Culture, blood (routine x 2)     Status: None   Collection Time: 09/21/18  8:56 AM  Result Value Ref Range Status   Specimen Description   Final    BLOOD RIGHT HAND Performed at [facility] [address], [place], [place] [address]    Special Requests   Final    BOTTLES DRAWN AEROBIC AND ANAEROBIC Blood Culture results may not be optimal due to an inadequate volume of blood received in culture bottles Performed at [facility] [address] [place], [place] [address]    Culture    Final    NO GROWTH 5 DAYS Performed at [facility], [place] [address], [place], [place] [address]    Report Status 09/26/2018 FINAL  Final  Urine Culture     Status: Abnormal   Collection Time: 09/21/18  3:43 PM  Result Value Ref Range Status   Specimen Description   Final    URINE, CLEAN CATCH Performed at [facility], [place] [address] [place], [place] [address]    Special Requests   Final    Immunocompromised Performed at [facility], [place] [address], [place], [place] [address]    Culture (A)  Final    20,000 COLONIES/mL PSEUDOMONAS AERUGINOSA 20,000 COLONIES/mL ENTEROCOCCUS FAECALIS    Report Status 09/24/2018 FINAL  Final   Organism ID, Bacteria PSEUDOMONAS AERUGINOSA (A)  Final   Organism ID, Bacteria ENTEROCOCCUS FAECALIS (A)  Final      Susceptibility   Enterococcus faecalis - MIC*    AMPICILLIN <=2 SENSITIVE Sensitive     LEVOFLOXACIN 1 SENSITIVE Sensitive     NITROFURANTOIN <=16 SENSITIVE Sensitive     VANCOMYCIN 1 SENSITIVE Sensitive     * 20,000 COLONIES/mL ENTEROCOCCUS FAECALIS   Pseudomonas aeruginosa - MIC*    CEFTAZIDIME 2 SENSITIVE Sensitive     CIPROFLOXACIN <=0.25 SENSITIVE Sensitive     GENTAMICIN <=1 SENSITIVE Sensitive     IMIPENEM 2 SENSITIVE Sensitive     PIP/TAZO 8 SENSITIVE Sensitive     CEFEPIME 2 SENSITIVE Sensitive     * 20,000 COLONIES/mL PSEUDOMONAS AERUGINOSA  Culture, blood (Routine X 2) w Reflex to ID Panel     Status: None (Preliminary result)   Collection Time: 09/23/18  6:17 PM  Result Value Ref Range Status   Specimen Description   Final    BLOOD RIGHT ARM Performed at [facility] [address], [place], [place] [address]    Special Requests  Final    BOTTLES DRAWN AEROBIC AND ANAEROBIC Blood Culture adequate volume Performed at [facility] [address], [place], [place] [address]    Culture   Final    NO GROWTH 3 DAYS Performed at [facility], [place] [address] [place], [place] [address]    Report Status PENDING  Incomplete      Studies:  No results found.  Assessment: 72 y.o. with newly diagnosed high-grade B-cell lymphoma, recently discharged from hospital after biopsy, presented to the emergency room with progressive weakness, dyspnea, chills and fever, anorexia, and extremity edema.  1.  Pseudomonas UTI and bacteremia, improved  2.  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 Hypoxic respite failure, improved  4.  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 Hypercalcemia secondary to malignancy, s/p zometa 12/5, resolved now  7. Severe arm and leg edema, secondary to severe adenopathy and hypoalbuminemia, improving  8. Deconditioning  9 left ptosis and dilated right pupil  10.  Sacral pressure ulcers   Plan:  -she is on Levaquin now, plan to complete on December 25, and she will remain hospitalized for IV antibiotics -she is due for cycle 2 chemo R-CHOP this Friday. If she remains to be in the hospital, I will probably give cycle 2 chemo on 12/26 or 12/27, she will be OK to be discharged to SNF after completing chemo  -I may consider prophylactic antibiotics after chemo due to her high risk of infection -continue prednisone 20mg  daily, plan to taper it off in 1-2 weeks  -I will f/u    Truitt Merle  09/26/2018

## 2018-09-26 NOTE — Progress Notes (Addendum)
Patient Demographics:    Patricia Murillo, is a 72 y.o. female, DOB - 1946-01-28, QDI:[id number]  Admit date - 09/07/2018   Admitting Physician Mauricio Gerome Apley, MD  Outpatient Primary MD for the patient is Patient, No Pcp Per  LOS - 18   Chief Complaint  Patient presents with  . 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 with newly diagnosed high-grade B-cell lymphoma, recently discharged from hospital after biopsy, presented to the emergency room with progressive weakness, dyspnea, chills and fever, anorexia, and extremity edema, found to have  Pseudomonas bacteremia, as well as enterococcus and Pseudomonas UTI, currently on IV Levaquin , awaiting insurance approval for  transfer to SNF rehab   PLan:-  1) Neutropenic fevers/pseudomonas aeruginosa bacteremia/Pseudomonas UTI--clinically much improved, currently afebrile, WBC  12.9, Granix discontinued ,  blood cultures from 09/21/2018 with pseudomonas aeruginosa, urine culture also with Pseudomonas aeruginosa AND enterococcus "faecalis,, chest x-ray without acute findings, treated with IV cefepime started 09/21/2018 thru 09/24/18, verbal consultation on 09/22/18 with Dr Talbot Grumbling and ID Pharmacist , verbal consultation with Dr Michel Bickers on 09/24/18, stopped  cefepime, start IV Levaquin 750 daily for 5 days starting on 09/24/2018 to cover for both Pseudomonas and enterococcus based on urine culture  2)Diffuse large B-cell lymphoma, activated B-cell type--- per Dr Burr Medico [facility] should be continued once acute issues resolve, next dose 09/30/18, continue prednisone 20 mg daily  3)Thrombocytopenia--due to lymphoma and chemo , thrombocytopenia has resolved,, platelets now > 354k  4)Autoimmune hemolytic anemia, 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 Burr Medico  Appreciated  5) hypercalcemia secondary to malignancy- s/p Zometa  6)Acute hypoxic respiratory failure. Likely related to progressive high-grade B-cell lymphoma with pulmonary involvement comp located by suspected aspiration--- respiratory status appears back to baseline--- continue bronchodilators, IV Levaquin as above #1  7) acute toxic and metabolic encephalopathy----mentation is back to baseline as per family, multi-factorial including malignancy, hypercalcemia and obviously infection,   8) moderate protein caloric malnutrition----due to underlying malignancy and poor oral intake, patient has low albumin and edema--- nutritional supplements advised, c/n Megace for appetite stimulation  9) significant left upper extremity swelling and weakness----due to lymphatic obstruction by high-grade lymphoma, venous Dopplers negative of the DVT on 08/29/2018  10)unstageable pressure injury in the sacrococcygeal area- 3.5cm x 1.5cm x 0cm --- please see wound care recommendations from ostomy RN dated 09/22/2018  Disposition/Need for in-Hospital Stay- patient  unable to be discharged at this time due to Pseudomonas bacteremia and UTI in an immunocompromised patient requiring IV antibiotics, also awaiting insurance approval for SNF  rehab   Code Status : FULL    Disposition Plan  : TBD  Consults  :  oncology   DVT Prophylaxis  :  TEDs - SCDs   Lab Results  Component Value Date   PLT 272 09/26/2018    Inpatient Medications  Scheduled Meds: . acyclovir  400 mg Oral BID  . allopurinol  300 mg Oral Daily  . amLODipine  5 mg Oral Daily  . cholecalciferol  2,000 Units Oral Daily  . collagenase   Topical Daily  . enoxaparin (LOVENOX) injection  40 mg Subcutaneous Q24H  . famotidine  20 mg Oral Daily  . feeding supplement (ENSURE ENLIVE)  237 mL Oral BID BM  . fluticasone  2 spray Each Nare Daily  . folic acid  1 mg Oral Daily  . guaiFENesin  600 mg Oral BID  . lactobacillus acidophilus  2 tablet Oral TID  . levalbuterol  0.63 mg Nebulization BID  . loratadine  10 mg Oral Daily  . mouth rinse  15 mL Mouth Rinse BID  . megestrol  400 mg Oral Daily  . mupirocin ointment   Topical BID  . pantoprazole  40 mg Oral BID  . polyethylene glycol  17 g Oral Daily  . predniSONE  20 mg Oral Q breakfast  . senna-docusate  1 tablet Oral BID   Continuous Infusions: . sodium chloride Stopped (09/26/18 0452)  . sodium chloride    . levofloxacin (LEVAQUIN) IV Stopped (09/25/18 1744)   PRN Meds:.sodium chloride, sodium chloride, acetaminophen **OR** [DISCONTINUED] acetaminophen, alteplase, alum & mag hydroxide-simeth **AND** lidocaine, benzonatate, lidocaine, lip balm, meclizine, ondansetron **OR** ondansetron (ZOFRAN) IV, oxyCODONE, sodium chloride flush    Anti-infectives (From admission, onward)   Start     Dose/Rate Route Frequency Ordered Stop   09/24/18 1500  levofloxacin (LEVAQUIN) IVPB 750 mg     750 mg 100 mL/hr over 90 Minutes Intravenous Every 24 hours 09/24/18 1023 09/29/18 1459   09/21/18 1200  ceFEPIme (MAXIPIME) 2 g in sodium chloride 0.9 % 100 mL IVPB     2 g 200 mL/hr over 30 Minutes Intravenous Every 8 hours 09/21/18 1027 09/24/18 1230   09/12/18 1600  ampicillin-sulbactam (UNASYN) 1.5 g in  sodium chloride 0.9 % 100 mL IVPB  Status:  Discontinued     1.5 g 200 mL/hr over 30 Minutes Intravenous Every 6 hours 09/12/18 1408 09/15/18 0753   09/10/18 1000  acyclovir (ZOVIRAX) 200 MG capsule 400 mg     400 mg Oral 2 times daily 09/10/18 0754     09/07/18 2000  cefTRIAXone (ROCEPHIN) 1 g in sodium chloride 0.9 % 100 mL IVPB  Status:  Discontinued     1 g 200 mL/hr over 30 Minutes Intravenous Every 24 hours 09/07/18 1844 09/12/18 0940   09/07/18 0830  ceFEPIme (MAXIPIME) 2 g in sodium chloride 0.9 % 100 mL IVPB     2 g 200 mL/hr over 30 Minutes Intravenous  Once 09/07/18 0820 09/07/18 0912   09/07/18 0830  metroNIDAZOLE (FLAGYL) IVPB 500 mg  Status:  Discontinued     500 mg 100 mL/hr over 60 Minutes Intravenous Every 8 hours 09/07/18 0820 09/08/18 1812   09/07/18 0830  vancomycin (VANCOCIN) IVPB 1000 mg/200 mL premix     1,000 mg 200 mL/hr over 60 Minutes Intravenous  Once 09/07/18  0820 09/07/18 1157        Objective:   Vitals:   09/26/18 0519 09/26/18 0838 09/26/18 0843 09/26/18 1412  BP: (!) 145/70   (!) 148/77  Pulse: 92   92  Resp: 17   14  Temp: 98.2 F (36.8 C)   98.3 F (36.8 C)  TempSrc: Oral   Oral  SpO2: 97% 96% 96% 94%  Weight:      Height:        Wt Readings from Last 3 Encounters:  09/26/18 109.3 kg  08/30/18 90.2 kg  08/04/18 83.5 kg     Intake/Output Summary (Last 24 hours) at 09/26/2018 1533 Last data filed at 09/26/2018 1416 Gross per 24 hour  Intake 857.08 ml  Output -  Net 857.08 ml     Physical Exam Patient is examined daily including today on 09/26/18 , exams remain the same as of yesterday except that has changed   Gen:- Awake,  Alert,  In no apparent distress  HEENT:- [place].AT, No sclera icterus Neck-Supple Neck,No JVD,.  Lungs-  CTAB , fair symmetrical air movement CV- S1, S2 normal, regular  Abd-  +ve B.Sounds, Abd Soft, No tenderness,    Extremity:- pedal pulses present, improving edema of left upper extremity, bilateral lower  extremity edema improving slowly, right upper extremity PICC line site w/o evidence of inflammation or infection Psych-affect is appropriate, and is coherent, oriented x3 Neuro-generalized weakness but no new focal deficits, no tremors Skin-unstageable pressure injury in the sacrococcygeal area- 3.5cm x 1.5cm x 0cm --- please see photo below Media Information   Document Information   Photos    09/23/2018 12:28  Attached To:  Hospital Encounter on 09/07/18  Source Information   Roxan Hockey, MD  [facility]     Data Review:   Micro Results Recent Results (from the past 240 hour(s))  Culture, blood (routine x 2)     Status: Abnormal   Collection Time: 09/21/18  8:13 AM  Result Value Ref Range Status   Specimen Description   Final    BLOOD RIGHT ANTECUBITAL Performed at [facility], [address], [place], [place] [address]    Special Requests   Final    BOTTLES DRAWN AEROBIC AND ANAEROBIC Blood Culture adequate volume Performed at [facility] [address], [place], [place] [address]    Culture  Setup Time   Final    GRAM NEGATIVE RODS AEROBIC BOTTLE ONLY CRITICAL RESULT CALLED TO, READ BACK BY AND VERIFIED WITHLavell Luster [facility] 6578 09/22/18 A BROWNING Performed at [facility], [place] [address], [place], [place] [address]    Culture PSEUDOMONAS AERUGINOSA (A)  Final   Report Status 09/24/2018 FINAL  Final   Organism ID, Bacteria PSEUDOMONAS AERUGINOSA  Final      Susceptibility   Pseudomonas aeruginosa - MIC*    CEFTAZIDIME 4 SENSITIVE Sensitive     CIPROFLOXACIN <=0.25 SENSITIVE Sensitive     GENTAMICIN <=1 SENSITIVE Sensitive     IMIPENEM 2 SENSITIVE Sensitive     PIP/TAZO <=4 SENSITIVE Sensitive     CEFEPIME 4 SENSITIVE Sensitive     * PSEUDOMONAS AERUGINOSA  Blood Culture ID Panel (Reflexed)     Status: Abnormal   Collection Time: 09/21/18  8:13 AM  Result Value Ref Range Status   Enterococcus species NOT  DETECTED NOT DETECTED Final   Listeria monocytogenes NOT DETECTED NOT DETECTED Final   Staphylococcus species NOT DETECTED NOT DETECTED Final  Staphylococcus aureus (BCID) NOT DETECTED NOT DETECTED Final   Streptococcus species NOT DETECTED NOT DETECTED Final   Streptococcus agalactiae NOT DETECTED NOT DETECTED Final   Streptococcus pneumoniae NOT DETECTED NOT DETECTED Final   Streptococcus pyogenes NOT DETECTED NOT DETECTED Final   Acinetobacter baumannii NOT DETECTED NOT DETECTED Final   Enterobacteriaceae species NOT DETECTED NOT DETECTED Final   Enterobacter cloacae complex NOT DETECTED NOT DETECTED Final   Escherichia coli NOT DETECTED NOT DETECTED Final   Klebsiella oxytoca NOT DETECTED NOT DETECTED Final   Klebsiella pneumoniae NOT DETECTED NOT DETECTED Final   Proteus species NOT DETECTED NOT DETECTED Final   Serratia marcescens NOT DETECTED NOT DETECTED Final   Carbapenem resistance NOT DETECTED NOT DETECTED Final   Haemophilus influenzae NOT DETECTED NOT DETECTED Final   Neisseria meningitidis NOT DETECTED NOT DETECTED Final   Pseudomonas aeruginosa DETECTED (A) NOT DETECTED Final    Comment: CRITICAL RESULT CALLED TO, READ BACK BY AND VERIFIED WITHLavell Luster [facility] 8250 09/22/18 A BROWNING    Candida albicans NOT DETECTED NOT DETECTED Final   Candida glabrata NOT DETECTED NOT DETECTED Final   Candida krusei NOT DETECTED NOT DETECTED Final   Candida parapsilosis NOT DETECTED NOT DETECTED Final   Candida tropicalis NOT DETECTED NOT DETECTED Final    Comment: Performed at [facility], [place] [address] [place], [place] [address]  Culture, blood (routine x 2)     Status: None   Collection Time: 09/21/18  8:56 AM  Result Value Ref Range Status   Specimen Description   Final    BLOOD RIGHT HAND Performed at [facility] [address] [place], [place] [address]    Special Requests   Final    BOTTLES DRAWN AEROBIC AND ANAEROBIC Blood Culture  results may not be optimal due to an inadequate volume of blood received in culture bottles Performed at [facility] [address], [place], [place] [address]    Culture   Final    NO GROWTH 5 DAYS Performed at [facility], [place] [address], [place], [place] [address]    Report Status 09/26/2018 FINAL  Final  Urine Culture     Status: Abnormal   Collection Time: 09/21/18  3:43 PM  Result Value Ref Range Status   Specimen Description   Final    URINE, CLEAN CATCH Performed at [facility], [place] [address] [place], [place] [address]    Special Requests   Final    Immunocompromised Performed at [facility], [place] [address] [place],  [address]    Culture (A)  Final    20,000 COLONIES/mL PSEUDOMONAS AERUGINOSA 20,000 COLONIES/mL ENTEROCOCCUS FAECALIS    Report Status 09/24/2018 FINAL  Final   Organism ID, Bacteria PSEUDOMONAS AERUGINOSA (A)  Final   Organism ID, Bacteria ENTEROCOCCUS FAECALIS (A)  Final      Susceptibility   Enterococcus faecalis - MIC*    AMPICILLIN <=2 SENSITIVE Sensitive     LEVOFLOXACIN 1 SENSITIVE Sensitive     NITROFURANTOIN <=16 SENSITIVE Sensitive     VANCOMYCIN 1 SENSITIVE Sensitive     * 20,000 COLONIES/mL ENTEROCOCCUS FAECALIS   Pseudomonas aeruginosa - MIC*    CEFTAZIDIME 2 SENSITIVE Sensitive     CIPROFLOXACIN <=0.25 SENSITIVE Sensitive     GENTAMICIN <=1 SENSITIVE Sensitive     IMIPENEM 2 SENSITIVE Sensitive     PIP/TAZO 8 SENSITIVE Sensitive     CEFEPIME 2 SENSITIVE Sensitive     *  20,000 COLONIES/mL PSEUDOMONAS AERUGINOSA  Culture, blood (Routine X 2) w Reflex to ID Panel     Status: None (Preliminary result)   Collection Time: 09/23/18  6:17 PM  Result Value Ref Range Status   Specimen Description   Final    BLOOD RIGHT ARM Performed at [facility] [address] [place], [place] [address]    Special Requests   Final    BOTTLES DRAWN AEROBIC AND  ANAEROBIC Blood Culture adequate volume Performed at [facility] [address], [place], [place] [address]    Culture   Final    NO GROWTH 3 DAYS Performed at [facility], [place] [address], [place], [place] [address]    Report Status PENDING  Incomplete    Radiology Reports Dg Chest 2 View  Result Date: 09/17/2018 CLINICAL DATA:  Cough. EXAM: CHEST - 2 VIEW COMPARISON:  Radiograph of September 14, 2018. FINDINGS: Stable cardiomediastinal silhouette. Large hiatal hernia is noted. No pneumothorax is noted. Right-sided pacemaker is unchanged in position. Mild bilateral pleural effusions are noted with associated atelectasis. Bony thorax is unremarkable. IMPRESSION: Stable large hiatal hernia. Mild bilateral pleural effusions are noted with associated atelectasis. Electronically Signed   By: Marijo Conception, M.D.   On: 09/17/2018 14:16   Dg Chest 2 View  Result Date: 09/07/2018 CLINICAL DATA:  Increasing shortness of breath. Decreased oxygen saturation since yesterday. EXAM: CHEST - 2 VIEW COMPARISON:  Single-view of the chest and CT chest 08/29/2018. FINDINGS: There is cardiomegaly without edema. Moderate left pleural effusion and basilar atelectasis are seen. Pulmonary nodules in the left upper lobe and along the periphery of the left lung are identified as seen on prior CT. Small right pulmonary nodule seen on CT are difficult to visualize on this examination. Large hiatal hernia is noted. No pneumothorax. IMPRESSION: No change in a moderate left pleural effusion and left basilar atelectasis. Pulmonary nodules. Cardiomegaly without edema. Hiatal hernia. Electronically Signed   By: Inge Rise M.D.   On: 12020/10/717 09:02   Ct Head Wo Contrast  Result Date: 09/07/2018 CLINICAL DATA:  Sepsis. Recent diagnosis of suspected metastatic disease in chest abdomen and pelvis on CT studies. New left sided proptosis and left upper and lower extremity weakness. No reported  injury. EXAM: CT HEAD WITHOUT CONTRAST TECHNIQUE: Contiguous axial images were obtained from the base of the skull through the vertex without intravenous contrast. COMPARISON:  07/15/2017 head CT. FINDINGS: Brain: No evidence of parenchymal hemorrhage or extra-axial fluid collection. No mass lesion, mass effect, or midline shift. No CT evidence of acute infarction. Nonspecific mild subcortical and periventricular white matter hypodensity, most in keeping with chronic small vessel ischemic change. Cerebral volume is age appropriate. No ventriculomegaly. Vascular: No acute abnormality. Skull: No evidence of calvarial fracture. Sinuses/Orbits: No fluid levels. Mild mucoperiosteal thickening in the maxillary sinuses bilaterally. Other:  The mastoid air cells are unopacified. IMPRESSION: 1.  No evidence of acute intracranial abnormality. 2. Mild chronic small vessel ischemic changes in the cerebral white matter. 3. Mild chronic appearing paranasal sinusitis. Electronically Signed   By: Ilona Sorrel M.D.   On: 12020/10/717 12:06   Ct Angio Chest Pe W/cm &/or Wo Cm  Result Date: 09/07/2018 CLINICAL DATA:  Shortness of breath and weakness. 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 EXAM: CT ANGIOGRAPHY CHEST WITH CONTRAST TECHNIQUE: Multidetector CT imaging of the chest was performed using the standard protocol during bolus administration of intravenous contrast.  Multiplanar CT image reconstructions and MIPs were obtained to evaluate the vascular anatomy. CONTRAST:  162m ISOVUE-370 IOPAMIDOL (ISOVUE-370) INJECTION 76% COMPARISON:  CTA of the chest on 08/29/2018 FINDINGS: Cardiovascular: The pulmonary arteries are adequately opacified. There is no evidence of pulmonary embolism. The thoracic aorta is normal in caliber and demonstrates no evidence of aneurysm or dissection. Proximal great vessels are normally patent. The heart size is stable and at the upper limits of normal.  Pacemaker shows stable positioning. No pericardial fluid identified. Mediastinum/Nodes: Massive confluent lymphadenopathy of the left axilla extending into the left subpectoral region and encasing the region of the brachial plexus, left axillary vessels and left subclavian vessels appears progressively enlarged since the prior CTA. Index lateral subpectoral lymph node measured previously at 2.8 cm in short axis now measures approximately 4.1 cm. More inferior left axillary node measured at 2.5 cm in short axis previously now measures 3.1 cm. Confluent lymph node mass encasement of vessels does not cause arterial occlusion but may be causing significant mass effect on venous structures. The veins are not opacified on the study to determine whether any venous thrombus is present. Associated right hilar and mediastinal lymphadenopathy also appears slightly more prominent compared to the prior study. Largest area right hilar lymphadenopathy measures approximately 2.2 cm. Stable large hiatal hernia. Lungs/Pleura: There also is progressive pulmonary involvement with lymphoma with definite progression since the prior CTA. All of the multiple bilateral pulmonary nodules show enlargement since the prior study. Index mass in the subpleural left lower lobe now measures approximately 2.2 x 3.7 cm (previously 1.7 x 3.3 cm). All other lesions also show enlargement. New small right pleural effusion. Small to moderate left pleural effusion appears stable since the prior study. Upper Abdomen: Stable splenomegaly. Musculoskeletal: No chest wall abnormality. No acute or significant osseous findings. Review of the MIP images confirms the above findings. IMPRESSION: 1. No evidence of pulmonary embolism. 2. Progression of lymphoma since the prior CTA on 11/25 with enlargement of left axillary, subpectoral and chest wall lymph node masses with encasement of left subclavian and axillary vascular structures. Enlargement of right hilar and  mediastinal lymph nodes since the prior study. Pulmonary involvement also shows significant progression with enlargement of all of the bilateral pulmonary nodule seen previously. 3. Normal arterial patency demonstrated of the left subclavian and axillary arteries. Venous patency cannot be determined on the CTA study and the patient would be at high risk of left upper extremity DVT given the large bulky lymphadenopathy present. 4. New small right pleural effusion. Stable small to moderate left pleural effusion. 5. Stable splenomegaly. Electronically Signed   By: GAletta EdouardM.D.   On: 12020/12/1417 12:29   Ct Angio Chest Pe W Or Wo Contrast  Result Date: 08/29/2018 CLINICAL DATA:  Severe left arm swelling since yesterday. Hemoglobin L5. EXAM: CT ANGIOGRAPHY CHEST WITH CONTRAST TECHNIQUE: Multidetector CT imaging of the chest was performed using the standard protocol during bolus administration of intravenous contrast. Multiplanar CT image reconstructions and MIPs were obtained to evaluate the vascular anatomy. CONTRAST:  100 cc ISOVUE-370 IOPAMIDOL (ISOVUE-370) INJECTION 76% COMPARISON:  CXR 08/29/2018 FINDINGS: Cardiovascular: Conventional branch pattern of the great vessels with minimal atherosclerosis at the origins. No significant stenosis. Patent left subclavian artery traversing an area of ill-defined soft tissue edema and enlargement as described below. Mild aortic atherosclerosis without aneurysm or dissection. Satisfactory opacification of the pulmonary arteries to the proximal segmental level without large central pulmonary embolus. Mediastinum/Nodes: Left axillary nodal enlargement is identified ranging  size from 0.3 cm to 2.8 cm short axis. Mildly enlarged right-sided axillary lymph nodes up to 1.3 cm short axis are identified. Large hiatal hernia with air-fluid level noted within. 1.4 cm short axis mass abutting the left lateral aspect of the aortic arch in the prevascular space is noted more  likely to represent a pulmonary lesion as opposed lymphadenopathy. Nonpathologic sized mediastinal and hilar lymph nodes are noted. Lungs/Pleura: Masslike opacities are identified in the lingula, the largest abutting the pleural surface measuring 3.3 x 1.7 cm on series 5/92 with previously mentioned paramediastinal mass in the left upper lobe measuring 2.3 x 1.4 cm, series 5/49. Superior segment left lower lobe mass measuring 1.8 x 1.3 cm, series 5/52 is also noted. There are smaller subcentimeter nodules within both upper lobes, right middle lobe and right lower lobe with largest masslike opacities in the right lower lobe measuring 1.1 x 1 cm, series 5/89 and abutting the right lateral margin of the hiatal hernia measuring 2.5 x 1.9 cm, series 5/105. Moderate left effusion with compressive atelectasis is identified with trace right pleural effusion. No pneumothorax. Upper Abdomen: No space-occupying mass of the included liver. The included right adrenal gland is unremarkable. Nodular soft tissue densities in the left upper quadrant are felt to represent partial imaging of small bowel less likely mesenteric adenopathy. The included spleen is top-normal in size. Musculoskeletal: Ill-defined asymmetric soft tissue enlargement in the region of the left pectoralis muscle suspicious for intramuscular hemorrhage and edema given reported rapid onset of left arm swelling and clinical report of low hemoglobin. Asymmetric subcutaneous edema of the partially included left breast with thickening of the skin concerning for possible possible inflammatory carcinoma of the breast. Right-sided pacemaker apparatus is noted with leads in the right atrium right ventricle. Thoracic spondylosis is noted. Review of the MIP images confirms the above findings. IMPRESSION: 1. No acute large central pulmonary embolus, aortic aneurysm or dissection. 2. Multilobar masslike opacities scattered throughout both lungs as above described, the  largest in the lingula measuring up to 3.3 cm, presumed metastatic given left axillary adenopathy also noted. 3. Left axillary nodal enlargement measuring up to 2.8 cm short axis. 4. Asymmetric soft tissue enlargement of the left pectoralis muscle suspicious for intramuscular hemorrhage and edema given reported rapid onset of left arm swelling and clinical report of low hemoglobin. An infiltrating mass or metastasis is also a differential considerations given local adenopathy, asymmetric subcutaneous edema of the partially included left breast with thickening of the skin concerning for possible inflammatory carcinoma of the breast. Clinical correlation is therefore recommended as well as mammographic correlation. 5. Moderate left and trace right pleural effusions with compressive atelectasis. These results were called by telephone at the time of interpretation on 08/29/2018 at 3:43 pm to Dr. Karmen Bongo , who verbally acknowledged these results. Aortic Atherosclerosis (ICD10-I70.0). Electronically Signed   By: Ashley Royalty M.D.   On: 08/29/2018 15:43   Ct Abdomen Pelvis W Contrast  Result Date: 08/31/2018 CLINICAL DATA:  Pulmonary nodules on previous chest CT suspicious for metastatic disease. EXAM: CT ABDOMEN AND PELVIS WITH CONTRAST TECHNIQUE: Multidetector CT imaging of the abdomen and pelvis was performed using the standard protocol following bolus administration of intravenous contrast. CONTRAST:  147m OMNIPAQUE IOHEXOL 300 MG/ML  SOLN COMPARISON:  Chest CT 08/29/2018 FINDINGS: Lower chest: Pacer wires noted in the heart. Large hiatal hernia. Bilateral pleural effusions, left greater than right. Pulmonary nodules noted in both lower lungs. Hepatobiliary: No focal abnormality within the liver  parenchyma. Liver measures 20.1 cm craniocaudal length. Dependent sludge or stones noted in the gallbladder. No intrahepatic or extrahepatic biliary dilation. Pancreas: No focal mass lesion. No dilatation of the  main duct. No intraparenchymal cyst. No peripancreatic edema. Spleen: Spleen measures 13.7 cm in craniocaudal length, upper normal to mildly increased. Adrenals/Urinary Tract: No adrenal nodule or mass. Kidneys unremarkable. Central sinus cysts noted left kidney. No evidence for hydroureter. The urinary bladder appears normal for the degree of distention. Stomach/Bowel: Large hiatal hernia. Duodenum is normally positioned as is the ligament of Treitz. Duodenal diverticulum noted. No small bowel wall thickening. No small bowel dilatation. The terminal ileum is normal. The appendix is not visualized, but there is no edema or inflammation in the region of the cecum. No gross colonic mass. No colonic wall thickening. Diverticular changes are noted in the left colon without evidence of diverticulitis. Vascular/Lymphatic: There is abdominal aortic atherosclerosis without aneurysm. Mild lymphadenopathy noted hepato duodenal ligament with 13 mm short axis index node visible on 21/3. Borderline para-aortic retroperitoneal lymphadenopathy evident. 10 mm short axis left para-aortic lymph node visible on 31/3. relatively bulky lymphadenopathy is seen along both pelvic sidewalls. 2.2 cm short axis left external iliac node is visible on 76/3. 2.8 cm short axis right external iliac lymph node associated with another 2.9 cm short axis node. 2.2 cm short axis lymph node identified in the right groin. Reproductive: Uterus unremarkable.  There is no adnexal mass. Other: No intraperitoneal free fluid. Musculoskeletal: Extensive body wall edema noted left lower hemithorax and left upper abdominal wall. Degenerative changes noted left hip. IMPRESSION: 1. Bulky pelvic sidewall lymphadenopathy with borderline to mild lymphadenopathy in the hepato duodenal ligament and para-aortic retroperitoneal space. Lymphoma or metastatic disease would be primary considerations. 2. Hepatomegaly with borderline splenomegaly. Prominent portal and splenic  veins raise the question of portal venous hypertension. 3. Gallbladder sludge or tiny stones. 4. Left chest wall and upper abdominal wall edema. 5. Large hiatal hernia with bilateral pleural effusions, similar to prior. 6.  Aortic Atherosclerois (ICD10-170.0) Electronically Signed   By: Misty Stanley M.D.   On: 08/31/2018 13:13   Dg Chest Port 1 View  Result Date: 09/21/2018 CLINICAL DATA:  Tachypnea. EXAM: PORTABLE CHEST 1 VIEW COMPARISON:  Chest radiographs 2 days prior, chest CT 12020-11-217 FINDINGS: Right-sided pacemaker in place. Right upper extremity PICC in place, tip in the proximal SVC. The patient is significantly rotated. Bilateral pleural effusions and associated airspace disease, likely progressed at the left lung base. Nodular density in the periphery of the left lung again seen. Large retrocardiac hiatal hernia. Unchanged heart size and mediastinal contours. No pulmonary edema. IMPRESSION: Bilateral pleural effusions, increased on the left. Unchanged radiographic appearance of large hiatal hernia. Peripheral nodular opacity as seen on prior chest CT. Additional pulmonary nodules on CT are not visualized radiographically Electronically Signed   By: Keith Rake M.D.   On: 09/21/2018 03:42   Dg Chest Port 1 View  Addendum Date: 09/14/2018   ADDENDUM REPORT: 09/14/2018 09:14 ADDENDUM: The impression should read: Stable chest x-ray demonstrating pulmonary edema and bilateral pleural effusions. Electronically Signed   By: Aletta Edouard M.D.   On: 09/14/2018 09:14   Result Date: 09/14/2018 CLINICAL DATA:  Shortness of breath. History of progressive high-grade B-cell lymphoma. EXAM: PORTABLE CHEST 1 VIEW COMPARISON:  Chest x-ray on 09/09/2018 and CT of the chest on 12020-11-217. FINDINGS: Stable heart size, appearance of pacemaker and large hiatal hernia. Stable probable bilateral pleural effusions and bilateral pulmonary nodules.  No overt pulmonary edema. No pneumothorax. IMPRESSION: Stable  chest x-ray demonstrating pulmonary lymphoma and bilateral pleural effusions. Electronically Signed: By: Aletta Edouard M.D. On: 09/11/2018 14:45   Dg Chest Port 1 View  Result Date: 09/14/2018 CLINICAL DATA:  Shortness of breath. EXAM: PORTABLE CHEST 1 VIEW COMPARISON:  Radiographs 09/11/2018 and 09/09/2018. FINDINGS: 0523 hours. Mild patient rotation to the right. The right arm PICC and right subclavian pacemaker leads appear unchanged. The heart size and mediastinal contours are stable. There is a large hiatal hernia. There are persistent bilateral pleural effusions which appear mildly improved on the right. There is no pneumothorax or confluent airspace opacity. IMPRESSION: No acute findings demonstrated. The right pleural effusion appears slightly smaller. Electronically Signed   By: Richardean Sale M.D.   On: 09/14/2018 09:10   Dg Chest Port 1 View  Result Date: 09/09/2018 CLINICAL DATA:  Hypoxia, history of progressive lymphoma EXAM: PORTABLE CHEST 1 VIEW COMPARISON:  1Nov 07, 202019 FINDINGS: Cardiac shadow is enlarged. Pacing device is again seen. Bilateral pleural effusions are again noted. The nodular changes seen on recent chest CT are not as well appreciated on today's exam. No pneumothorax is noted. IMPRESSION: Bilateral effusions. Patchy densities are noted consistent findings of recent CT although less well appreciated on today's exam. Electronically Signed   By: Inez Catalina M.D.   On: 09/09/2018 06:35   Dg Chest Portable 1 View  Result Date: 08/29/2018 CLINICAL DATA:  72 year old female with a history of shortness of breath and edema EXAM: PORTABLE CHEST 1 VIEW COMPARISON:  04/19/2018, 04/18/2018 FINDINGS: Cardiomediastinal silhouette unchanged in size and contour. Double density projects over the lower mediastinum is unchanged. On the frontal view there is a Passy at the left base obscuring the left hemidiaphragm with blunting of left costophrenic angle, increased from the prior. No  pneumothorax. Unchanged right chest wall cardiac pacing device with 2 leads in place. No confluent airspace disease on the right. IMPRESSION: New opacity at the left lung base obscuring left hemidiaphragm in the left heart border, compatible with pleural effusion and associated atelectasis/consolidation. Hiatal hernia. Electronically Signed   By: Corrie Mckusick D.O.   On: 08/29/2018 09:09   Dg Abd Portable 1v  Result Date: 09/13/2018 CLINICAL DATA:  Constipation EXAM: PORTABLE ABDOMEN - 1 VIEW COMPARISON:  CT 08/31/2018 FINDINGS: Nonobstructed bowel gas pattern. Residual contrast within the colon. Mild to moderate feces retention at the rectum. IMPRESSION: Nonobstructed gas pattern with mild to moderate fecal retention at the rectum Electronically Signed   By: Donavan Foil M.D.   On: 09/13/2018 23:51   Dg Swallowing Func-speech Pathology  Result Date: 09/15/2018 Objective Swallowing Evaluation: Type of Study: MBS-Modified Barium Swallow Study  Patient Details Name: SHAVAUN OSTERLOH MRN: [id number] Date of Birth: Nov 09, 1945 Today's Date: 09/15/2018 Time: SLP Start Time (ACUTE ONLY): 1210 -SLP Stop Time (ACUTE ONLY): 1240 SLP Time Calculation (min) (ACUTE ONLY): 30 min Past Medical History: Past Medical History: Diagnosis Date . Arthritis  . Hiatal hernia  . History of chicken pox  . Hyperlipidemia  . Pacemaker  Past Surgical History: Past Surgical History: Procedure Laterality Date . ANKLE SURGERY   . PACEMAKER IMPLANT N/A 04/18/2018  Procedure: PACEMAKER IMPLANT;  Surgeon: Evans Lance, MD;  Location: [facility];  Service: Cardiovascular;  Laterality: N/A; HPI: MARGAURITE SALIDO is a 72 y.o. 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  During her hospitalization she underwent left axillary lymph node biopsy.  She was discharged home in a stable condition,  but apparently she has been developing worsening weakness, generalized, worse with exertion, no improving factors, associated with malaise, chills and poor appetite.  Over the last 24 hours her symptoms have been more severe to the point where she has difficulty standing. She has persistent left upper extremity edema which has been painful, sharp in nature up to 10 out of 10 intensity.  Her weakness has led to dyspnea which has been severe in intensity, and triggered her emergency room visit today.Pt was made NPO yesterday due to concerns for aspiration.   Today pt is alert, has congested weak cough concerrning for airway protection.   Pt underwent MBS yesterday and was placed on full liquid diet with precautions. RN reports pt continues to cough with po intake.  Per note from MD, now plan is for pt to have small bore feeding tube placed for nutritional support.  Unfortunately pt did not tolerate attempt at insertion of feeding tube.  Repeat swallow evaluation ordered.  SLP follow up to inform family/pt of plan and determine if clinically pt appears improved.    Subjective: Alert Assessment / Plan / Recommendation CHL IP CLINICAL IMPRESSIONS 09/15/2018 Clinical Impression Patient had a MBS on 09/12/18 and patient presented with Moderately severe oropharyngeal dysphagia with sensorimotor deficits. She presents much stronger today and presents with a mild oropharyngeal dysphagia. Oral preparation and bolus transfer was mild evidenced by trace oral residue. Patient initiated swallow at the level of the pyriform sinuses with no penetration or aspiration with cup sips of all sizes. She did have penetration with cup sip with straw, but spontaneously cleared layngeal vestibule. She initiated swallow at the valleculae with puree and cracker bolus, no penetration or aspiration. Significant pharyngeal residue present after 1st swallow, but improved mostly cleared with second spontaneous swallow. Upper 1/3 of esphagus observed  to clear well with puree and tablet. Due to significant sensorimotor improvement, recommend patient have regular diet with thin liquids, medication whole in puree. Speech therapy to follow up for continue diet tolerance and swallowing therapy. SLP Visit Diagnosis Dysphagia, oropharyngeal phase (R13.12) Attention and concentration deficit following -- Frontal lobe and executive function deficit following -- Impact on safety and function Mild aspiration risk   CHL IP TREATMENT RECOMMENDATION 09/15/2018 Treatment Recommendations Therapy as outlined in treatment plan below   Prognosis 09/15/2018 Prognosis for Safe Diet Advancement Good Barriers to Reach Goals -- Barriers/Prognosis Comment -- CHL IP DIET RECOMMENDATION 09/15/2018 SLP Diet Recommendations Regular solids;Thin liquid Liquid Administration via Cup;No straw Medication Administration Whole meds with liquid Compensations Slow rate;Small sips/bites;Other (Comment) Postural Changes Remain semi-upright after after feeds/meals (Comment);Seated upright at 90 degrees   CHL IP OTHER RECOMMENDATIONS 09/15/2018 Recommended Consults -- Oral Care Recommendations Oral care before and after PO Other Recommendations --   CHL IP FOLLOW UP RECOMMENDATIONS 09/14/2018 Follow up Recommendations (No Data)   CHL IP FREQUENCY AND DURATION 09/15/2018 Speech Therapy Frequency (ACUTE ONLY) min 2x/week Treatment Duration 1 week      CHL IP ORAL PHASE 09/15/2018 Oral Phase Impaired Oral - Pudding Teaspoon -- Oral - Pudding Cup Lingual/palatal residue Oral - Honey Teaspoon -- Oral - Honey Cup -- Oral - Nectar Teaspoon NT Oral - Nectar Cup NT Oral - Nectar Straw NT Oral - Thin Teaspoon Lingual/palatal residue Oral - Thin Cup Lingual/palatal residue;Impaired mastication Oral - Thin Straw Lingual/palatal residue Oral - Puree Lingual/palatal residue Oral -  Mech Soft Lingual/palatal residue Oral - Regular -- Oral - Multi-Consistency -- Oral - Pill -- Oral Phase - Comment --  CHL IP PHARYNGEAL  PHASE 09/15/2018 Pharyngeal Phase Impaired Pharyngeal- Pudding Teaspoon Delayed swallow initiation-vallecula;Pharyngeal residue - valleculae;Pharyngeal residue - pyriform Pharyngeal -- Pharyngeal- Pudding Cup NT Pharyngeal -- Pharyngeal- Honey Teaspoon -- Pharyngeal -- Pharyngeal- Honey Cup -- Pharyngeal -- Pharyngeal- Nectar Teaspoon NT Pharyngeal -- Pharyngeal- Nectar Cup NT Pharyngeal -- Pharyngeal- Nectar Straw NT Pharyngeal -- Pharyngeal- Thin Teaspoon Delayed swallow initiation-pyriform sinuses;Reduced pharyngeal peristalsis;Reduced anterior laryngeal mobility;Reduced laryngeal elevation;Reduced airway/laryngeal closure;Reduced tongue base retraction;Pharyngeal residue - valleculae;Pharyngeal residue - pyriform Pharyngeal -- Pharyngeal- Thin Cup Delayed swallow initiation-pyriform sinuses;Reduced anterior laryngeal mobility;Reduced laryngeal elevation;Reduced airway/laryngeal closure;Reduced tongue base retraction;Pharyngeal residue - valleculae;Pharyngeal residue - pyriform;Reduced pharyngeal peristalsis Pharyngeal -- Pharyngeal- Thin Straw Delayed swallow initiation-pyriform sinuses;Reduced anterior laryngeal mobility;Reduced laryngeal elevation;Reduced airway/laryngeal closure;Reduced pharyngeal peristalsis;Reduced tongue base retraction;Pharyngeal residue - valleculae;Pharyngeal residue - pyriform;Penetration/Aspiration during swallow Pharyngeal -- Pharyngeal- Puree Delayed swallow initiation-vallecula;Reduced laryngeal elevation;Reduced airway/laryngeal closure;Reduced tongue base retraction;Pharyngeal residue - valleculae Pharyngeal -- Pharyngeal- Mechanical Soft Delayed swallow initiation-vallecula;Reduced laryngeal elevation;Reduced airway/laryngeal closure;Reduced tongue base retraction Pharyngeal -- Pharyngeal- Regular NT Pharyngeal -- Pharyngeal- Multi-consistency -- Pharyngeal -- Pharyngeal- Pill Delayed swallow initiation-vallecula;Reduced laryngeal elevation;Reduced airway/laryngeal closure  Pharyngeal -- Pharyngeal Comment --  CHL IP CERVICAL ESOPHAGEAL PHASE 09/15/2018 Cervical Esophageal Phase WFL Pudding Teaspoon -- Pudding Cup -- Honey Teaspoon -- Honey Cup -- Nectar Teaspoon -- Nectar Cup -- Nectar Straw -- Thin Teaspoon -- Thin Cup -- Thin Straw -- Puree -- Mechanical Soft -- Regular -- Multi-consistency -- Pill -- Cervical Esophageal Comment -- Charlynne Cousins Ward, MA, CCC-SLP 09/15/2018 2:59 PM              Dg Swallowing Func-speech Pathology  Result Date: 09/12/2018 Objective Swallowing Evaluation: Type of Study: MBS-Modified Barium Swallow Study  Patient Details Name: JULIAN ASKIN MRN: [id number] Date of Birth: [birth year] Today's Date: 09/12/2018 Time: SLP Start Time (ACUTE ONLY): 1300 -SLP Stop Time (ACUTE ONLY): 1337 SLP Time Calculation (min) (ACUTE ONLY): 37 min Past Medical History: Past Medical History: Diagnosis Date . Arthritis  . Hiatal hernia  . History of chicken pox  . Hyperlipidemia  . Pacemaker  Past Surgical History: Past Surgical History: Procedure Laterality Date . ANKLE SURGERY   . PACEMAKER IMPLANT N/A 04/18/2018  Procedure: PACEMAKER IMPLANT;  Surgeon: Evans Lance, MD;  Location: [place] [facility];  Service: Cardiovascular;  Laterality: N/A; HPI: MARYL BLALOCK is a 72 y.o. 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  During her hospitalization she underwent left axillary lymph node biopsy.  She was discharged home in a stable condition, but apparently she has been developing worsening weakness, generalized, worse with exertion, no improving factors, associated with malaise, chills and poor appetite.  Over the last 24 hours her symptoms have been more severe to the point where she has difficulty standing. She has persistent left upper extremity edema which has been painful, sharp in nature up to 10 out of 10 intensity.  Her  weakness has led to dyspnea which has been severe in intensity, and triggered her emergency room visit today.Pt was made NPO yesterday due to concerns for aspiration.   Today pt is alert, has congested weak cough concerrning for airway protection.  Subjective: The patient was seen sitting upright in bed with her daughter at the bedside.  Assessment / Plan / Recommendation CHL IP CLINICAL IMPRESSIONS 09/12/2018 Clinical Impression Pt presents with moderately severe oropharyngeal dysphagia with sensorimotor deficits.  Decreased oral propulsion and weak pharyngeal motility results in significant pharyngeal more than oral residuals.  Pt does NOT sense residuals and is unable to "hock" to expectorate to clear them.  Chin tuck, head turn postures did NOT decrease residuals.  Pt did aspirate with thin liquids posterior trachea with delayed cough response.  Although she did not aspirate with thicker consistencies, increased residuals present which if aspiration post-swallow are more difficult for pulmonary clearance.  Pt is grossly weak at this time - uncertain of her baseline swallow function.  Pt is a high aspiration/malnutrition risk due to level of dysphagia and weak nonproductive cough.  If she is to consume po intake= it should be with accepted risks and mitigation strategies.  Question if pt's swallow with improve with improved medical status. SLP Visit Diagnosis Dysphagia, oropharyngeal phase (R13.12) Attention and concentration deficit following -- Frontal lobe and executive function deficit following -- Impact on safety and function Severe aspiration risk;Risk for inadequate nutrition/hydration   CHL IP TREATMENT RECOMMENDATION 09/11/2018 Treatment Recommendations Therapy as outlined in treatment plan below   Prognosis 09/12/2018 Prognosis for Safe Diet Advancement Fair Barriers to Reach Goals Severity of deficits;Cognitive deficits;Other (Comment) Barriers/Prognosis Comment -- CHL IP DIET RECOMMENDATION 09/12/2018  SLP Diet Recommendations (No Data) Liquid Administration via Cup Medication Administration Crushed with puree Compensations Slow rate;Small sips/bites;Follow solids with liquid Postural Changes Remain semi-upright after after feeds/meals (Comment);Seated upright at 90 degrees   CHL IP OTHER RECOMMENDATIONS 09/12/2018 Recommended Consults -- Oral Care Recommendations Oral care before and after PO Other Recommendations Have oral suction available   CHL IP FOLLOW UP RECOMMENDATIONS 09/12/2018 Follow up Recommendations (No Data)   CHL IP FREQUENCY AND DURATION 09/12/2018 Speech Therapy Frequency (ACUTE ONLY) min 2x/week Treatment Duration 1 week      CHL IP ORAL PHASE 09/12/2018 Oral Phase Impaired Oral - Pudding Teaspoon -- Oral - Pudding Cup -- Oral - Honey Teaspoon -- Oral - Honey Cup -- Oral - Nectar Teaspoon Weak lingual manipulation;Reduced posterior propulsion;Lingual/palatal residue Oral - Nectar Cup Weak lingual manipulation;Reduced posterior propulsion;Lingual/palatal residue Oral - Nectar Straw Weak lingual manipulation;Reduced posterior propulsion;Lingual/palatal residue Oral - Thin Teaspoon Weak lingual manipulation;Reduced posterior propulsion Oral - Thin Cup Weak lingual manipulation;Reduced posterior propulsion Oral - Thin Straw Reduced posterior propulsion Oral - Puree Weak lingual manipulation;Reduced posterior propulsion Oral - Mech Soft Weak lingual manipulation;Reduced posterior propulsion Oral - Regular -- Oral - Multi-Consistency -- Oral - Pill -- Oral Phase - Comment --  CHL IP PHARYNGEAL PHASE 09/12/2018 Pharyngeal Phase Impaired Pharyngeal- Pudding Teaspoon -- Pharyngeal -- Pharyngeal- Pudding Cup -- Pharyngeal -- Pharyngeal- Honey Teaspoon -- Pharyngeal -- Pharyngeal- Honey Cup -- Pharyngeal -- Pharyngeal- Nectar Teaspoon Reduced pharyngeal peristalsis;Reduced epiglottic inversion;Reduced tongue base retraction;Pharyngeal residue - valleculae;Pharyngeal residue - pyriform Pharyngeal -- Pharyngeal-  Nectar Cup Reduced pharyngeal peristalsis;Reduced epiglottic inversion;Reduced laryngeal elevation;Reduced airway/laryngeal closure;Reduced tongue base retraction;Pharyngeal residue - valleculae;Pharyngeal residue - pyriform;Penetration/Aspiration during swallow Pharyngeal Material enters airway, remains ABOVE vocal cords and not ejected out Pharyngeal- Nectar Straw Reduced pharyngeal peristalsis;Reduced epiglottic inversion;Reduced anterior laryngeal mobility;Reduced airway/laryngeal closure;Reduced tongue base retraction;Reduced laryngeal elevation;Pharyngeal residue - valleculae;Pharyngeal residue - pyriform Pharyngeal -- Pharyngeal- Thin Teaspoon Reduced pharyngeal peristalsis;Reduced epiglottic inversion;Reduced laryngeal elevation;Reduced airway/laryngeal closure;Reduced tongue base retraction;Pharyngeal residue - valleculae;Pharyngeal residue - pyriform Pharyngeal -- Pharyngeal- Thin Cup Reduced pharyngeal peristalsis;Reduced epiglottic inversion;Reduced laryngeal elevation;Reduced airway/laryngeal closure;Reduced tongue base retraction;Pharyngeal residue -  valleculae;Pharyngeal residue - pyriform;Penetration/Aspiration during swallow Pharyngeal Material enters airway, passes BELOW cords without attempt by patient to eject out (silent aspiration) Pharyngeal- Thin Straw Reduced pharyngeal peristalsis;Reduced epiglottic inversion;Reduced laryngeal elevation;Reduced airway/laryngeal closure;Reduced tongue base retraction;Pharyngeal residue - valleculae;Pharyngeal residue - pyriform Pharyngeal Material enters airway, passes BELOW cords and not ejected out despite cough attempt by patient Pharyngeal- Puree Reduced epiglottic inversion;Reduced pharyngeal peristalsis;Reduced tongue base retraction;Reduced airway/laryngeal closure;Reduced laryngeal elevation;Pharyngeal residue - valleculae;Pharyngeal residue - pyriform Pharyngeal -- Pharyngeal- Mechanical Soft Reduced pharyngeal peristalsis;Reduced epiglottic  inversion;Reduced tongue base retraction;Pharyngeal residue - valleculae;Reduced airway/laryngeal closure Pharyngeal -- Pharyngeal- Regular -- Pharyngeal -- Pharyngeal- Multi-consistency -- Pharyngeal -- Pharyngeal- Pill -- Pharyngeal -- Pharyngeal Comment --  CHL IP CERVICAL ESOPHAGEAL PHASE 09/12/2018 Cervical Esophageal Phase Impaired Pudding Teaspoon -- Pudding Cup -- Honey Teaspoon -- Honey Cup -- Nectar Teaspoon -- Nectar Cup -- Nectar Straw -- Thin Teaspoon -- Thin Cup -- Thin Straw -- Puree -- Mechanical Soft -- Regular -- Multi-consistency -- Pill -- Cervical Esophageal Comment upon esophageal sweep, pt appeared clear Luanna Salk, MS [facility] SLP Acute Rehab Services Pager [phone] Office [phone] Macario Golds 09/12/2018, 5:29 PM              [place] Core Biopsy (lymph Nodes)  Result Date: 08/30/2018 INDICATION: 72 year old with scattered lung lesions, left arm swelling and extensive left axillary lymphadenopathy. Findings are concerning for metastatic disease and tissue diagnosis is needed. EXAM: ULTRASOUND-GUIDED CORE BIOPSY OF LEFT AXILLARY LYMPH NODE MEDICATIONS: None. ANESTHESIA/SEDATION: Moderate (conscious) sedation was employed during this procedure. A total of Versed 0.5 mg and Fentanyl 50 mcg was administered intravenously. Moderate Sedation Time: 15 minutes. The patient's level of consciousness and vital signs were monitored continuously by radiology nursing throughout the procedure under my direct supervision. FLUOROSCOPY TIME:  None COMPLICATIONS: None immediate. PROCEDURE: Informed written consent was obtained from the patient after a thorough discussion of the procedural risks, benefits and alternatives. All questions were addressed. A timeout was performed prior to the initiation of the procedure. Ultrasound demonstrated enlarged left axillary lymph nodes. The left axilla was prepped and draped in sterile fashion. Skin was anesthetized with 1% lidocaine. 18 gauge core biopsies  were obtained of a round enlarged lymph node with ultrasound guidance. Total of 6 core biopsies were obtained. Specimens placed in saline. Bandage placed over the puncture site. FINDINGS: Multiple enlarged hypoechoic lymph nodes in left axilla. Biopsy needle confirmed within the targeted lymph node. No bleeding or hematoma formation at end of procedure. IMPRESSION: Ultrasound-guided core biopsy of an enlarged left axillary lymph node. Electronically Signed   By: Markus Daft M.D.   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 Limitations: Body habitus, swelling and poor ultrasound/tissue interface. Comparison Study: No prior study on file for comparison. Performing Technologist: Sharion Dove RVS  Examination Guidelines: A complete evaluation includes B-mode imaging, spectral Doppler, color Doppler, and power Doppler as needed of all accessible portions of each vessel. Bilateral testing is considered an integral part of a complete examination. Limited examinations for reoccurring indications may be performed as noted.  Right Findings: +----------+------------+----------+---------+-----------+-------+ RIGHT     CompressiblePropertiesPhasicitySpontaneousSummary +----------+------------+----------+---------+-----------+-------+ Subclavian                         Yes       Yes            +----------+------------+----------+---------+-----------+-------+  Left Findings: +----------+------------+----------+---------+-----------+-------+ LEFT      CompressiblePropertiesPhasicitySpontaneousSummary +----------+------------+----------+---------+-----------+-------+ IJV                                Yes       Yes            +----------+------------+----------+---------+-----------+-------+ Subclavian                         Yes        Yes            +----------+------------+----------+---------+-----------+-------+ Axillary      Full                                          +----------+------------+----------+---------+-----------+-------+ Brachial      Full                                          +----------+------------+----------+---------+-----------+-------+ Cephalic      Full                                          +----------+------------+----------+---------+-----------+-------+ Basilic       Full                                          +----------+------------+----------+---------+-----------+-------+  Summary:  Right: No evidence of thrombosis in the subclavian.  Left: No evidence of deep vein thrombosis in the upper extremity. However, unable to visualize the radial and ulnar. No evidence of superficial vein thrombosis in the upper extremity. However, unable to visualize the radial and ulnar. No evidence of thrombosis in the . However, unable to visualize the radial and ulnar. This was a limited study.  *See table(s) above for measurements and observations.  Diagnosing physician: Deitra Mayo MD Electronically signed by Deitra Mayo MD on 08/29/2018 at 4:20:56 PM.    Final    Ir Picc Placement Right >5 Yrs Inc Img Guide  Result Date: 09/09/2018 INDICATION: Acute lymphoma, access for chemotherapy as an inpatient EXAM: ULTRASOUND AND FLUOROSCOPIC GUIDED PICC LINE INSERTION MEDICATIONS: 1% lidocaine local CONTRAST:  None FLUOROSCOPY TIME:  Twenty-four seconds (8 mGy) COMPLICATIONS: None immediate. TECHNIQUE: The procedure, risks, benefits, and alternatives were explained to the patient and informed written consent was obtained. A timeout was performed prior to the initiation of the procedure. The right upper extremity was prepped with chlorhexidine in a sterile fashion, and a sterile drape was applied covering the operative field. Maximum barrier sterile technique with sterile gowns and  gloves were used for the procedure. A timeout was performed prior to the initiation of the procedure. Local anesthesia was provided with 1% lidocaine. Under direct ultrasound guidance, the right brachial vein was accessed with a micropuncture kit after the overlying soft tissues were anesthetized with 1% lidocaine. An ultrasound image was saved for documentation purposes. A guidewire was advanced to the level of the superior caval-atrial junction for measurement purposes and the PICC line was cut to length. A peel-away sheath was placed and a  36 cm, 5 [place], dual lumen was inserted to level of the superior caval-atrial junction. A post procedure spot fluoroscopic was obtained. The catheter easily aspirated and flushed and was sutured in place. A dressing was placed. The patient tolerated the procedure well without immediate post procedural complication. FINDINGS: After catheter placement, the tip lies within the superior cavoatrial junction. The catheter aspirates and flushes normally and is ready for immediate use. IMPRESSION: Successful ultrasound and fluoroscopic guided placement of a right brachial vein approach, 36 cm, 5 French, dual lumen PICC with tip at the superior caval-atrial junction. The PICC line is ready for immediate use. Electronically Signed   By: Jerilynn Mages.  Shick M.D.   On: 09/09/2018 09:23   [place] Ekg Site Rite  Result Date: 09/08/2018 If Site Rite image not attached, placement could not be confirmed due to current cardiac rhythm.    CBC Recent Labs  Lab 09/21/18 0607 09/22/18 0546 09/23/18 0950 09/24/18 0648 09/25/18 0523 09/26/18 0511  WBC 0.4* 2.2* 12.7* 12.9* 9.5 10.0  HGB 8.0* 7.0* 10.5* 11.0* 10.7* 10.9*  HCT 24.5* 22.1* 31.9* 33.9* 33.4* 34.3*  PLT 111* 129* 239 354 347 272  MCV 87.5 89.5 87.6 88.1 87.4 88.2  MCH 28.6 28.3 28.8 28.6 28.0 28.0  MCHC 32.7 31.7 32.9 32.4 32.0 31.8  RDW 16.1* 15.9* 15.0 15.7* 15.9* 16.5*  LYMPHSABS 0.0* 0.0* 0.1* 0.3* 0.1*  --   MONOABS 0.2 0.5  0.9 1.3* 1.0  --   EOSABS 0.0 0.0 0.0 0.0 0.0  --   BASOSABS 0.0 0.0 0.1 0.0 0.0  --     Chemistries  Recent Labs  Lab 09/20/18 0450 09/21/18 0607 09/22/18 0546 09/25/18 0523 09/26/18 0511  NA 135 133* 137 137 135  K 3.8 3.5 3.1* 3.2* 3.6  CL 98 94* 106 103 103  CO2 _0 GLUCOSE 110* 96 89 80 99  BUN _1 CREATININE 0.36* 0.46 0.41* 0.41* 0.33*  CALCIUM 8.4* 7.7* 7.9* 7.8* 7.5*  MG  --   --   --   --  1.9  AST 16  --  9* 17  --   ALT 18  --  14 18  --   ALKPHOS 61  --  57 84  --   BILITOT 2.1*  --  1.9* 0.6  --     Lab Results  Component Value Date   HGBA1C 5.3 09/13/2012    Roxan Hockey M.D on 09/26/2018 at 3:33 PM  Pager---[phone] Go to [email or web address] - password [email or web address] for contact info  [facility] - Office  [phone]

## 2018-09-26 NOTE — Progress Notes (Signed)
Physical Therapy Treatment Patient Details Name: Patricia Murillo MRN: [id number] DOB: [birth year] Today's Date: 09/26/2018    History of Present Illness 72 y.o. 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  During her hospitalization she underwent left axillary lymph node biopsy. Pt admitted with LUE edema, weakness, dyspnea. 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     PT Comments    The patient continues to remain weak and legs are risk for buckling when standing. Continue PT.  Follow Up Recommendations  SNF     Equipment Recommendations       Recommendations for Other Services       Precautions / Restrictions Precautions Precautions: Fall Precaution Comments: decreased LUE contol    Mobility  Bed Mobility Overal bed mobility: Needs Assistance Bed Mobility: Rolling;Sidelying to Sit Rolling: Mod assist Sidelying to sit: Max assist   Sit to supine: Max assist   General bed mobility comments: after rolling, patient placed legs over, required assist with trunk to sit up right.   Transfers                 General transfer comment: NT, did not have +2 assist.   Ambulation/Gait                 Stairs             Wheelchair Mobility    Modified Rankin (Stroke Patients Only)       Balance   Sitting-balance support: Bilateral upper extremity supported;Feet supported Sitting balance-Leahy Scale: Good Sitting balance - Comments: held to steady bar with R,when sitting EOB                                    Cognition Arousal/Alertness: Awake/alert                                     General Comments: decreased attention to task. Cues to sustain attention      Exercises General Exercises - Lower Extremity Ankle Circles/Pumps: AROM;10 reps;Seated;Both Long  Arc Quad: AAROM;Both;10 reps;Seated Hip ABduction/ADduction: AAROM;Both;10 reps;Seated Hip Flexion/Marching: AAROM;Both;10 reps;Seated    General Comments        Pertinent Vitals/Pain Faces Pain Scale: Hurts even more Pain Location: back Pain Descriptors / Indicators: Aching Pain Intervention(s): Monitored during session;Repositioned    Home Living                      Prior Function            PT Goals (current goals can now be found in the care plan section) Progress towards PT goals: Progressing toward goals    Frequency    Min 2X/week      PT Plan Current plan remains appropriate    Co-evaluation              AM-PAC PT "6 Clicks" Mobility   Outcome Measure  Help needed turning from your back to your side while in a flat bed without using bedrails?: A Lot Help needed moving from lying on your back to sitting on the side of a flat bed without using bedrails?: Total Help needed moving to and from a bed to a  chair (including a wheelchair)?: Total Help needed standing up from a chair using your arms (e.g., wheelchair or bedside chair)?: Total Help needed to walk in hospital room?: Total Help needed climbing 3-5 steps with a railing? : Total 6 Click Score: 7    End of Session Equipment Utilized During Treatment: Gait belt Activity Tolerance: Patient tolerated treatment well Patient left: in bed;with call bell/phone within reach;with family/visitor present Nurse Communication: Need for lift equipment;Mobility status PT Visit Diagnosis: Other abnormalities of gait and mobility (R26.89);Muscle weakness (generalized) (M62.81)     Time: 7530-0511 PT Time Calculation (min) (ACUTE ONLY): 28 min  Charges:  $Therapeutic Activity: 23-37 mins                     Tresa Endo PT Acute Rehabilitation Services Pager [phone] Office [phone]    Claretha Cooper 09/26/2018, 2:56 PM

## 2018-09-26 NOTE — Progress Notes (Signed)
Nutrition Follow-up  DOCUMENTATION CODES:   Non-severe (moderate) malnutrition in context of acute illness/injury  INTERVENTION:   Continue Ensure Enlive po BID, each supplement provides 350 kcal and 20 grams of protein  NUTRITION DIAGNOSIS:   Moderate Malnutrition related to acute illness, poor appetite as evidenced by percent weight loss, energy intake < or equal to 75% for > or equal to 1 month, mild muscle depletion. Ongoing.  GOAL:   Patient will meet greater than or equal to 90% of their needs  Progressing.  MONITOR:   PO intake, Supplement acceptance, Labs, Weight trends, I & O's  ASSESSMENT:   72 y.o. 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  During her hospitalization she underwent left axillary lymph node biopsy.  She was discharged home in a stable condition, but apparently she has been developing worsening weakness, generalized, worse with exertion, no improving factors, associated with malaise, chills and poor appetite.  Patient's PO intakes have started to improve over the weekend. Today pt consumed 100% of breakfast and 90% of lunch. Pt has been drinking Ensure supplements inconsistently.   Per weight records, pt is still +45 lb since admission weight but overall weight has decreased since last assessment. Per chart, edema has improved in LEs.   Labs reviewed. Medications: Vitamin D tablet daily, folic acid tablet daily, Megace suspension daily, K-DUR tablet once, Senokot-S tablet BID  Diet Order:   Diet Order            Diet regular Room service appropriate? Yes; Fluid consistency: Thin  Diet effective now              EDUCATION NEEDS:   Education needs have been addressed  Skin:  Skin Assessment: Reviewed RN Assessment Skin Integrity Issues:: Other (Comment), Unstageable Unstageable: sacrum Other: left  labia  Last BM:  12/22  Height:   Ht Readings from Last 1 Encounters:  09/08/18 5\' 6"  (1.676 m)    Weight:   Wt Readings from Last 1 Encounters:  09/26/18 109.3 kg    Ideal Body Weight:  59.1 kg  BMI:  Body mass index is 38.9 kg/m.  Estimated Nutritional Needs:   Kcal:  1700-1900  Protein:  90-100g  Fluid:  1.7L/day  Patricia Bibles, MS, RD, LDN [place] Pager: [phone] After Hours Pager: [phone]

## 2018-09-26 NOTE — Progress Notes (Signed)
Chaplain assisted in completing advance directive.   Copy Placed in chart.

## 2018-09-27 NOTE — Progress Notes (Signed)
PT demonstrated hands on understanding of Flutter device. PC at this time- clear.

## 2018-09-27 NOTE — Progress Notes (Signed)
Patient Demographics:    Patricia Murillo, is a 72 y.o. female, DOB - [birth year], RVI:[id number]  Admit date - 09/07/2018   Admitting Physician Mauricio Gerome Apley, MD  Outpatient Primary MD for the patient is Patient, No Pcp Per  LOS - 77   Chief Complaint  Patient presents with  . 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 with newly diagnosed high-grade B-cell lymphoma, recently discharged from hospital after biopsy, presented to the emergency room with progressive weakness, dyspnea, chills and fever, anorexia, and extremity edema, found to have  Pseudomonas bacteremia, as well as enterococcus and Pseudomonas UTI, currently on IV Levaquin , awaiting insurance approval for  transfer to SNF rehab   PLan:-  1) Neutropenic Fevers/Pseudomonas Aeruginosa Bacteremia/Pseudomonas UTI--clinically much improved, currently afebrile, WBC  12.9, Granix discontinued ,  blood cultures from 09/21/2018 with pseudomonas aeruginosa, urine culture also with Pseudomonas aeruginosa AND enterococcus "faecalis",, chest x-ray without acute findings, treated with IV cefepime started 09/21/2018 thru 09/24/18, verbal consultation on 09/22/18 with Dr Talbot Grumbling and ID Pharmacist , verbal consultation with Dr Michel Bickers on 09/24/18, stopped  cefepime, start IV Levaquin 750 daily for 5 days starting on 09/24/2018 (Last dose 09/28/18) to cover for both Pseudomonas and enterococcus based on urine culture  2)Diffuse large B-cell lymphoma, activated B-cell type--- per Dr Burr Medico [facility] should be continued once acute issues resolve, next dose 09/30/18, continue prednisone 20 mg daily  3)Thrombocytopenia--due to lymphoma and chemo , thrombocytopenia has resolved,, platelets now > 354k  4)Autoimmune hemolytic anemia, 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 Burr Medico  Appreciated  5)Hypercalcemia secondary to malignancy- s/p Zometa  6)Acute hypoxic respiratory failure. Likely related to progressive high-grade B-cell lymphoma with pulmonary involvement comp located by suspected aspiration--- respiratory status appears back to baseline--- continue bronchodilators, IV Levaquin as above #1 (last dose 09/28/18)  7)Acute toxic and metabolic encephalopathy----mentation is back to baseline as per family, it was multi-factorial including malignancy, encephalopathy has now resolved , hypercalcemia and obviously infection,   8)Moderate protein caloric malnutrition----due to underlying malignancy and poor oral intake, patient has low albumin and edema--- nutritional supplements advised, c/n Megace for appetite stimulation  9)Significant left upper extremity swelling and weakness----due to lymphatic obstruction by high-grade lymphoma, venous Dopplers negative of the DVT on 08/29/2018  10)unstageable pressure injury in the sacrococcygeal area- 3.5cm x 1.5cm x 0cm --- please see wound care recommendations from ostomy RN  dated 09/22/2018  Disposition/Need for in-Hospital Stay- patient unable to be discharged at this time due to Pseudomonas bacteremia and UTI in an immunocompromised  patient requiring IV antibiotics, also awaiting insurance approval for SNF rehab   Code Status : FULL    Disposition Plan  : SNF rehab  Consults  :  oncology   DVT Prophylaxis  :  Lovenox/TEDs - SCDs   Lab Results  Component Value Date   PLT 272 09/26/2018    Inpatient Medications  Scheduled Meds: . acyclovir  400 mg Oral BID  . allopurinol  300 mg Oral Daily  . amLODipine  5 mg Oral Daily  . cholecalciferol  2,000 Units Oral Daily  . collagenase   Topical Daily  . enoxaparin (LOVENOX) injection  40 mg Subcutaneous Q24H  . famotidine  20 mg Oral Daily  . feeding supplement (ENSURE ENLIVE)  237 mL Oral BID BM  . fluticasone  2 spray Each Nare Daily  . folic acid  1 mg Oral Daily  . guaiFENesin  600 mg Oral BID  . lactobacillus acidophilus & bulgar  1 tablet Oral TID WC  . levalbuterol  0.63 mg Nebulization BID  . loratadine  10 mg Oral Daily  . mouth rinse  15 mL Mouth Rinse BID  . megestrol  400 mg Oral Daily  . mupirocin ointment   Topical BID  . pantoprazole  40 mg Oral BID  . polyethylene glycol  17 g Oral Daily  . predniSONE  20 mg Oral Q breakfast  . senna-docusate  1 tablet Oral BID   Continuous Infusions: . sodium chloride Stopped (09/26/18 2016)  . sodium chloride    . levofloxacin (LEVAQUIN) IV 750 mg (09/27/18 1517)   PRN Meds:.sodium chloride, sodium chloride, acetaminophen **OR** [DISCONTINUED] acetaminophen, alteplase, alum & mag hydroxide-simeth **AND** lidocaine, benzonatate, lidocaine, lip balm, meclizine, ondansetron **OR** ondansetron (ZOFRAN) IV, oxyCODONE, sodium chloride flush    Anti-infectives (From admission, onward)   Start     Dose/Rate Route Frequency Ordered Stop   09/24/18 1500  levofloxacin (LEVAQUIN) IVPB 750 mg     750 mg 100 mL/hr over 90 Minutes Intravenous Every 24 hours 09/24/18 1023 09/29/18 1459   09/21/18 1200  ceFEPIme (MAXIPIME) 2 g in sodium chloride 0.9 % 100 mL IVPB     2 g 200 mL/hr over 30 Minutes Intravenous  Every 8 hours 09/21/18 1027 09/24/18 1230   09/12/18 1600  ampicillin-sulbactam (UNASYN) 1.5 g in sodium chloride 0.9 % 100 mL IVPB  Status:  Discontinued     1.5 g 200 mL/hr over 30 Minutes Intravenous Every 6 hours 09/12/18 1408 09/15/18 0753   09/10/18 1000  acyclovir (ZOVIRAX) 200 MG capsule 400 mg     400 mg Oral 2 times daily 09/10/18 0754     09/07/18 2000  cefTRIAXone (ROCEPHIN) 1 g in sodium chloride 0.9 % 100 mL IVPB  Status:  Discontinued     1 g 200 mL/hr over 30 Minutes Intravenous Every 24 hours 09/07/18 1844 09/12/18 0940   09/07/18 0830  ceFEPIme (MAXIPIME) 2 g in sodium chloride 0.9 % 100 mL IVPB     2 g 200 mL/hr over 30 Minutes Intravenous  Once 09/07/18 0820 09/07/18 0912   09/07/18 0830  metroNIDAZOLE (FLAGYL) IVPB 500 mg  Status:  Discontinued     500 mg 100 mL/hr over 60 Minutes Intravenous Every 8 hours 09/07/18 0820 09/08/18 1812   09/07/18 0830  vancomycin (VANCOCIN) IVPB 1000 mg/200 mL premix  1,000 mg 200 mL/hr over 60 Minutes Intravenous  Once 09/07/18 0820 09/07/18 1157        Objective:   Vitals:   09/27/18 0545 09/27/18 0611 09/27/18 0729 09/27/18 1212  BP: (!) 144/68   (!) 161/67  Pulse: 94   94  Resp: 18   16  Temp: 98 F (36.7 C)   98.5 F (36.9 C)  TempSrc: Oral   Oral  SpO2: 96%  96% 96%  Weight:  110.2 kg    Height:        Wt Readings from Last 3 Encounters:  09/27/18 110.2 kg  08/30/18 90.2 kg  08/04/18 83.5 kg     Intake/Output Summary (Last 24 hours) at 09/27/2018 1711 Last data filed at 09/27/2018 1430 Gross per 24 hour  Intake 763.19 ml  Output -  Net 763.19 ml     Physical Exam Patient is examined daily including today on 09/27/18 , exams remain the same as of yesterday except that has changed   Gen:- Awake,  Alert,  In no apparent distress  HEENT:- [place].AT, No sclera icterus Neck-Supple Neck,No JVD,.  Lungs-  CTAB , fair symmetrical air movement CV- S1, S2 normal, regular  Abd-  +ve B.Sounds, Abd Soft, No  tenderness,    Extremity:- pedal pulses present, improving edema of left upper extremity, bilateral lower extremity edema improving slowly, right upper extremity PICC line site w/o evidence of inflammation or infection Psych-affect is appropriate, and is coherent, oriented x3 Neuro-generalized weakness but no new focal deficits, no tremors Skin-unstageable pressure injury in the sacrococcygeal area- 3.5cm x 1.5cm x 0cm --- please see photo below Media Information   Document Information   Photos    09/23/2018 12:28  Attached To:  Hospital Encounter on 09/07/18  Source Information   Roxan Hockey, MD  [facility]     Data Review:   Micro Results Recent Results (from the past 240 hour(s))  Culture, blood (routine x 2)     Status: Abnormal   Collection Time: 09/21/18  8:13 AM  Result Value Ref Range Status   Specimen Description   Final    BLOOD RIGHT ANTECUBITAL Performed at [facility], [address] [place], [place] [address]    Special Requests   Final    BOTTLES DRAWN AEROBIC AND ANAEROBIC Blood Culture adequate volume Performed at [place] [address], [place], [place] [address]    Culture  Setup Time   Final    GRAM NEGATIVE RODS AEROBIC BOTTLE ONLY CRITICAL RESULT CALLED TO, READ BACK BY AND VERIFIED WITHLavell Luster [facility] 1638 09/22/18 A BROWNING Performed at [facility], [place] [address] [place], [place] [address]    Culture PSEUDOMONAS AERUGINOSA (A)  Final   Report Status 09/24/2018 FINAL  Final   Organism ID, Bacteria PSEUDOMONAS AERUGINOSA  Final      Susceptibility   Pseudomonas aeruginosa - MIC*    CEFTAZIDIME 4 SENSITIVE Sensitive     CIPROFLOXACIN <=0.25 SENSITIVE Sensitive     GENTAMICIN <=1 SENSITIVE Sensitive     IMIPENEM 2 SENSITIVE Sensitive     PIP/TAZO <=4 SENSITIVE Sensitive     CEFEPIME 4 SENSITIVE Sensitive     * PSEUDOMONAS AERUGINOSA  Blood Culture ID Panel (Reflexed)     Status:  Abnormal   Collection Time: 09/21/18  8:13 AM  Result Value Ref Range Status   Enterococcus species NOT DETECTED NOT DETECTED Final   Listeria monocytogenes NOT DETECTED  NOT DETECTED Final   Staphylococcus species NOT DETECTED NOT DETECTED Final   Staphylococcus aureus (BCID) NOT DETECTED NOT DETECTED Final   Streptococcus species NOT DETECTED NOT DETECTED Final   Streptococcus agalactiae NOT DETECTED NOT DETECTED Final   Streptococcus pneumoniae NOT DETECTED NOT DETECTED Final   Streptococcus pyogenes NOT DETECTED NOT DETECTED Final   Acinetobacter baumannii NOT DETECTED NOT DETECTED Final   Enterobacteriaceae species NOT DETECTED NOT DETECTED Final   Enterobacter cloacae complex NOT DETECTED NOT DETECTED Final   Escherichia coli NOT DETECTED NOT DETECTED Final   Klebsiella oxytoca NOT DETECTED NOT DETECTED Final   Klebsiella pneumoniae NOT DETECTED NOT DETECTED Final   Proteus species NOT DETECTED NOT DETECTED Final   Serratia marcescens NOT DETECTED NOT DETECTED Final   Carbapenem resistance NOT DETECTED NOT DETECTED Final   Haemophilus influenzae NOT DETECTED NOT DETECTED Final   Neisseria meningitidis NOT DETECTED NOT DETECTED Final   Pseudomonas aeruginosa DETECTED (A) NOT DETECTED Final    Comment: CRITICAL RESULT CALLED TO, READ BACK BY AND VERIFIED WITHLavell Luster [facility] 3748 09/22/18 A BROWNING    Candida albicans NOT DETECTED NOT DETECTED Final   Candida glabrata NOT DETECTED NOT DETECTED Final   Candida krusei NOT DETECTED NOT DETECTED Final   Candida parapsilosis NOT DETECTED NOT DETECTED Final   Candida tropicalis NOT DETECTED NOT DETECTED Final    Comment: Performed at [facility], [address], [place], [place] [address]  Culture, blood (routine x 2)     Status: None   Collection Time: 09/21/18  8:56 AM  Result Value Ref Range Status   Specimen Description   Final    BLOOD RIGHT HAND Performed at [place] [address],  [place], [place] [address]    Special Requests   Final    BOTTLES DRAWN AEROBIC AND ANAEROBIC Blood Culture results may not be optimal due to an inadequate volume of blood received in culture bottles Performed at [place] [address], [place], [place] [address]    Culture   Final    NO GROWTH 5 DAYS Performed at [facility], [place] [address] [place], [place] [address]    Report Status 09/26/2018 FINAL  Final  Urine Culture     Status: Abnormal   Collection Time: 09/21/18  3:43 PM  Result Value Ref Range Status   Specimen Description   Final    URINE, CLEAN CATCH Performed at [facility], [place] [address], [place], [place] [address]    Special Requests   Final    Immunocompromised Performed at [facility], [place] [address] [place], [place] [address]    Culture (A)  Final    20,000 COLONIES/mL PSEUDOMONAS AERUGINOSA 20,000 COLONIES/mL ENTEROCOCCUS FAECALIS    Report Status 09/24/2018 FINAL  Final   Organism ID, Bacteria PSEUDOMONAS AERUGINOSA (A)  Final   Organism ID, Bacteria ENTEROCOCCUS FAECALIS (A)  Final      Susceptibility   Enterococcus faecalis - MIC*    AMPICILLIN <=2 SENSITIVE Sensitive     LEVOFLOXACIN 1 SENSITIVE Sensitive     NITROFURANTOIN <=16 SENSITIVE Sensitive     VANCOMYCIN 1 SENSITIVE Sensitive     * 20,000 COLONIES/mL ENTEROCOCCUS FAECALIS   Pseudomonas aeruginosa - MIC*    CEFTAZIDIME 2 SENSITIVE Sensitive     CIPROFLOXACIN <=0.25 SENSITIVE Sensitive     GENTAMICIN <=1 SENSITIVE Sensitive     IMIPENEM 2 SENSITIVE Sensitive  PIP/TAZO 8 SENSITIVE Sensitive     CEFEPIME 2 SENSITIVE Sensitive     * 20,000 COLONIES/mL PSEUDOMONAS AERUGINOSA  Culture, blood (Routine X 2) w Reflex to ID Panel     Status: None (Preliminary result)   Collection Time: 09/23/18  6:17 PM  Result Value Ref Range Status   Specimen Description   Final    BLOOD RIGHT ARM Performed at [facility] [place] [address] [place], [place] [address]    Special Requests   Final    BOTTLES DRAWN AEROBIC AND ANAEROBIC Blood Culture adequate volume Performed at [place] [address] [place], [place] [address]    Culture   Final    NO GROWTH 4 DAYS Performed at [facility], [place] [address], [place], [place] [address]    Report Status PENDING  Incomplete    Radiology Reports Dg Chest 2 View  Result Date: 09/17/2018 CLINICAL DATA:  Cough. EXAM: CHEST - 2 VIEW COMPARISON:  Radiograph of September 14, 2018. FINDINGS: Stable cardiomediastinal silhouette. Large hiatal hernia is noted. No pneumothorax is noted. Right-sided pacemaker is unchanged in position. Mild bilateral pleural effusions are noted with associated atelectasis. Bony thorax is unremarkable. IMPRESSION: Stable large hiatal hernia. Mild bilateral pleural effusions are noted with associated atelectasis. Electronically Signed   By: Marijo Conception, M.D.   On: 09/17/2018 14:16   Dg Chest 2 View  Result Date: 09/07/2018 CLINICAL DATA:  Increasing shortness of breath. Decreased oxygen saturation since yesterday. EXAM: CHEST - 2 VIEW COMPARISON:  Single-view of the chest and CT chest 08/29/2018. FINDINGS: There is cardiomegaly without edema. Moderate left pleural effusion and basilar atelectasis are seen. Pulmonary nodules in the left upper lobe and along the periphery of the left lung are identified as seen on prior CT. Small right pulmonary nodule seen on CT are difficult to visualize on this examination. Large hiatal hernia is noted. No pneumothorax. IMPRESSION: No change in a moderate left pleural effusion and left basilar atelectasis. Pulmonary nodules. Cardiomegaly without edema. Hiatal hernia. Electronically Signed   By: Inge Rise M.D.   On: 1October 10, 202019 09:02   Ct Head Wo Contrast  Result Date: 09/07/2018 CLINICAL DATA:  Sepsis. Recent diagnosis of suspected metastatic disease in chest abdomen and  pelvis on CT studies. New left sided proptosis and left upper and lower extremity weakness. No reported injury. EXAM: CT HEAD WITHOUT CONTRAST TECHNIQUE: Contiguous axial images were obtained from the base of the skull through the vertex without intravenous contrast. COMPARISON:  07/15/2017 head CT. FINDINGS: Brain: No evidence of parenchymal hemorrhage or extra-axial fluid collection. No mass lesion, mass effect, or midline shift. No CT evidence of acute infarction. Nonspecific mild subcortical and periventricular white matter hypodensity, most in keeping with chronic small vessel ischemic change. Cerebral volume is age appropriate. No ventriculomegaly. Vascular: No acute abnormality. Skull: No evidence of calvarial fracture. Sinuses/Orbits: No fluid levels. Mild mucoperiosteal thickening in the maxillary sinuses bilaterally. Other:  The mastoid air cells are unopacified. IMPRESSION: 1.  No evidence of acute intracranial abnormality. 2. Mild chronic small vessel ischemic changes in the cerebral white matter. 3. Mild chronic appearing paranasal sinusitis. Electronically Signed   By: Ilona Sorrel M.D.   On: 1October 10, 202019 12:06   Ct Angio Chest Pe W/cm &/or Wo Cm  Result Date: 09/07/2018 CLINICAL DATA:  Shortness of breath and weakness. 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 EXAM: CT ANGIOGRAPHY CHEST WITH CONTRAST TECHNIQUE: Multidetector  CT imaging of the chest was performed using the standard protocol during bolus administration of intravenous contrast. Multiplanar CT image reconstructions and MIPs were obtained to evaluate the vascular anatomy. CONTRAST:  125m ISOVUE-370 IOPAMIDOL (ISOVUE-370) INJECTION 76% COMPARISON:  CTA of the chest on 08/29/2018 FINDINGS: Cardiovascular: The pulmonary arteries are adequately opacified. There is no evidence of pulmonary embolism. The thoracic aorta is normal in caliber and demonstrates no evidence of aneurysm or dissection.  Proximal great vessels are normally patent. The heart size is stable and at the upper limits of normal. Pacemaker shows stable positioning. No pericardial fluid identified. Mediastinum/Nodes: Massive confluent lymphadenopathy of the left axilla extending into the left subpectoral region and encasing the region of the brachial plexus, left axillary vessels and left subclavian vessels appears progressively enlarged since the prior CTA. Index lateral subpectoral lymph node measured previously at 2.8 cm in short axis now measures approximately 4.1 cm. More inferior left axillary node measured at 2.5 cm in short axis previously now measures 3.1 cm. Confluent lymph node mass encasement of vessels does not cause arterial occlusion but may be causing significant mass effect on venous structures. The veins are not opacified on the study to determine whether any venous thrombus is present. Associated right hilar and mediastinal lymphadenopathy also appears slightly more prominent compared to the prior study. Largest area right hilar lymphadenopathy measures approximately 2.2 cm. Stable large hiatal hernia. Lungs/Pleura: There also is progressive pulmonary involvement with lymphoma with definite progression since the prior CTA. All of the multiple bilateral pulmonary nodules show enlargement since the prior study. Index mass in the subpleural left lower lobe now measures approximately 2.2 x 3.7 cm (previously 1.7 x 3.3 cm). All other lesions also show enlargement. New small right pleural effusion. Small to moderate left pleural effusion appears stable since the prior study. Upper Abdomen: Stable splenomegaly. Musculoskeletal: No chest wall abnormality. No acute or significant osseous findings. Review of the MIP images confirms the above findings. IMPRESSION: 1. No evidence of pulmonary embolism. 2. Progression of lymphoma since the prior CTA on 11/25 with enlargement of left axillary, subpectoral and chest wall lymph node  masses with encasement of left subclavian and axillary vascular structures. Enlargement of right hilar and mediastinal lymph nodes since the prior study. Pulmonary involvement also shows significant progression with enlargement of all of the bilateral pulmonary nodule seen previously. 3. Normal arterial patency demonstrated of the left subclavian and axillary arteries. Venous patency cannot be determined on the CTA study and the patient would be at high risk of left upper extremity DVT given the large bulky lymphadenopathy present. 4. New small right pleural effusion. Stable small to moderate left pleural effusion. 5. Stable splenomegaly. Electronically Signed   By: GAletta EdouardM.D.   On: 105/28/202019 12:29   Ct Angio Chest Pe W Or Wo Contrast  Result Date: 08/29/2018 CLINICAL DATA:  Severe left arm swelling since yesterday. Hemoglobin L5. EXAM: CT ANGIOGRAPHY CHEST WITH CONTRAST TECHNIQUE: Multidetector CT imaging of the chest was performed using the standard protocol during bolus administration of intravenous contrast. Multiplanar CT image reconstructions and MIPs were obtained to evaluate the vascular anatomy. CONTRAST:  100 cc ISOVUE-370 IOPAMIDOL (ISOVUE-370) INJECTION 76% COMPARISON:  CXR 08/29/2018 FINDINGS: Cardiovascular: Conventional branch pattern of the great vessels with minimal atherosclerosis at the origins. No significant stenosis. Patent left subclavian artery traversing an area of ill-defined soft tissue edema and enlargement as described below. Mild aortic atherosclerosis without aneurysm or dissection. Satisfactory opacification of the pulmonary arteries to  the proximal segmental level without large central pulmonary embolus. Mediastinum/Nodes: Left axillary nodal enlargement is identified ranging size from 0.3 cm to 2.8 cm short axis. Mildly enlarged right-sided axillary lymph nodes up to 1.3 cm short axis are identified. Large hiatal hernia with air-fluid level noted within. 1.4 cm short  axis mass abutting the left lateral aspect of the aortic arch in the prevascular space is noted more likely to represent a pulmonary lesion as opposed lymphadenopathy. Nonpathologic sized mediastinal and hilar lymph nodes are noted. Lungs/Pleura: Masslike opacities are identified in the lingula, the largest abutting the pleural surface measuring 3.3 x 1.7 cm on series 5/92 with previously mentioned paramediastinal mass in the left upper lobe measuring 2.3 x 1.4 cm, series 5/49. Superior segment left lower lobe mass measuring 1.8 x 1.3 cm, series 5/52 is also noted. There are smaller subcentimeter nodules within both upper lobes, right middle lobe and right lower lobe with largest masslike opacities in the right lower lobe measuring 1.1 x 1 cm, series 5/89 and abutting the right lateral margin of the hiatal hernia measuring 2.5 x 1.9 cm, series 5/105. Moderate left effusion with compressive atelectasis is identified with trace right pleural effusion. No pneumothorax. Upper Abdomen: No space-occupying mass of the included liver. The included right adrenal gland is unremarkable. Nodular soft tissue densities in the left upper quadrant are felt to represent partial imaging of small bowel less likely mesenteric adenopathy. The included spleen is top-normal in size. Musculoskeletal: Ill-defined asymmetric soft tissue enlargement in the region of the left pectoralis muscle suspicious for intramuscular hemorrhage and edema given reported rapid onset of left arm swelling and clinical report of low hemoglobin. Asymmetric subcutaneous edema of the partially included left breast with thickening of the skin concerning for possible possible inflammatory carcinoma of the breast. Right-sided pacemaker apparatus is noted with leads in the right atrium right ventricle. Thoracic spondylosis is noted. Review of the MIP images confirms the above findings. IMPRESSION: 1. No acute large central pulmonary embolus, aortic aneurysm or  dissection. 2. Multilobar masslike opacities scattered throughout both lungs as above described, the largest in the lingula measuring up to 3.3 cm, presumed metastatic given left axillary adenopathy also noted. 3. Left axillary nodal enlargement measuring up to 2.8 cm short axis. 4. Asymmetric soft tissue enlargement of the left pectoralis muscle suspicious for intramuscular hemorrhage and edema given reported rapid onset of left arm swelling and clinical report of low hemoglobin. An infiltrating mass or metastasis is also a differential considerations given local adenopathy, asymmetric subcutaneous edema of the partially included left breast with thickening of the skin concerning for possible inflammatory carcinoma of the breast. Clinical correlation is therefore recommended as well as mammographic correlation. 5. Moderate left and trace right pleural effusions with compressive atelectasis. These results were called by telephone at the time of interpretation on 08/29/2018 at 3:43 pm to Dr. Karmen Bongo , who verbally acknowledged these results. Aortic Atherosclerosis (ICD10-I70.0). Electronically Signed   By: Ashley Royalty M.D.   On: 08/29/2018 15:43   Ct Abdomen Pelvis W Contrast  Result Date: 08/31/2018 CLINICAL DATA:  Pulmonary nodules on previous chest CT suspicious for metastatic disease. EXAM: CT ABDOMEN AND PELVIS WITH CONTRAST TECHNIQUE: Multidetector CT imaging of the abdomen and pelvis was performed using the standard protocol following bolus administration of intravenous contrast. CONTRAST:  165m OMNIPAQUE IOHEXOL 300 MG/ML  SOLN COMPARISON:  Chest CT 08/29/2018 FINDINGS: Lower chest: Pacer wires noted in the heart. Large hiatal hernia. Bilateral pleural effusions, left  greater than right. Pulmonary nodules noted in both lower lungs. Hepatobiliary: No focal abnormality within the liver parenchyma. Liver measures 20.1 cm craniocaudal length. Dependent sludge or stones noted in the gallbladder. No  intrahepatic or extrahepatic biliary dilation. Pancreas: No focal mass lesion. No dilatation of the main duct. No intraparenchymal cyst. No peripancreatic edema. Spleen: Spleen measures 13.7 cm in craniocaudal length, upper normal to mildly increased. Adrenals/Urinary Tract: No adrenal nodule or mass. Kidneys unremarkable. Central sinus cysts noted left kidney. No evidence for hydroureter. The urinary bladder appears normal for the degree of distention. Stomach/Bowel: Large hiatal hernia. Duodenum is normally positioned as is the ligament of Treitz. Duodenal diverticulum noted. No small bowel wall thickening. No small bowel dilatation. The terminal ileum is normal. The appendix is not visualized, but there is no edema or inflammation in the region of the cecum. No gross colonic mass. No colonic wall thickening. Diverticular changes are noted in the left colon without evidence of diverticulitis. Vascular/Lymphatic: There is abdominal aortic atherosclerosis without aneurysm. Mild lymphadenopathy noted hepato duodenal ligament with 13 mm short axis index node visible on 21/3. Borderline para-aortic retroperitoneal lymphadenopathy evident. 10 mm short axis left para-aortic lymph node visible on 31/3. relatively bulky lymphadenopathy is seen along both pelvic sidewalls. 2.2 cm short axis left external iliac node is visible on 76/3. 2.8 cm short axis right external iliac lymph node associated with another 2.9 cm short axis node. 2.2 cm short axis lymph node identified in the right groin. Reproductive: Uterus unremarkable.  There is no adnexal mass. Other: No intraperitoneal free fluid. Musculoskeletal: Extensive body wall edema noted left lower hemithorax and left upper abdominal wall. Degenerative changes noted left hip. IMPRESSION: 1. Bulky pelvic sidewall lymphadenopathy with borderline to mild lymphadenopathy in the hepato duodenal ligament and para-aortic retroperitoneal space. Lymphoma or metastatic disease would be  primary considerations. 2. Hepatomegaly with borderline splenomegaly. Prominent portal and splenic veins raise the question of portal venous hypertension. 3. Gallbladder sludge or tiny stones. 4. Left chest wall and upper abdominal wall edema. 5. Large hiatal hernia with bilateral pleural effusions, similar to prior. 6.  Aortic Atherosclerois (ICD10-170.0) Electronically Signed   By: Misty Stanley M.D.   On: 08/31/2018 13:13   Dg Chest Port 1 View  Result Date: 09/21/2018 CLINICAL DATA:  Tachypnea. EXAM: PORTABLE CHEST 1 VIEW COMPARISON:  Chest radiographs 2 days prior, chest CT 1Feb 09, 202019 FINDINGS: Right-sided pacemaker in place. Right upper extremity PICC in place, tip in the proximal SVC. The patient is significantly rotated. Bilateral pleural effusions and associated airspace disease, likely progressed at the left lung base. Nodular density in the periphery of the left lung again seen. Large retrocardiac hiatal hernia. Unchanged heart size and mediastinal contours. No pulmonary edema. IMPRESSION: Bilateral pleural effusions, increased on the left. Unchanged radiographic appearance of large hiatal hernia. Peripheral nodular opacity as seen on prior chest CT. Additional pulmonary nodules on CT are not visualized radiographically Electronically Signed   By: Keith Rake M.D.   On: 09/21/2018 03:42   Dg Chest Port 1 View  Addendum Date: 09/14/2018   ADDENDUM REPORT: 09/14/2018 09:14 ADDENDUM: The impression should read: Stable chest x-ray demonstrating pulmonary edema and bilateral pleural effusions. Electronically Signed   By: Aletta Edouard M.D.   On: 09/14/2018 09:14   Result Date: 09/14/2018 CLINICAL DATA:  Shortness of breath. History of progressive high-grade B-cell lymphoma. EXAM: PORTABLE CHEST 1 VIEW COMPARISON:  Chest x-ray on 09/09/2018 and CT of the chest on 1Feb 09, 202019. FINDINGS: Stable heart  size, appearance of pacemaker and large hiatal hernia. Stable probable bilateral pleural  effusions and bilateral pulmonary nodules. No overt pulmonary edema. No pneumothorax. IMPRESSION: Stable chest x-ray demonstrating pulmonary lymphoma and bilateral pleural effusions. Electronically Signed: By: Aletta Edouard M.D. On: 09/11/2018 14:45   Dg Chest Port 1 View  Result Date: 09/14/2018 CLINICAL DATA:  Shortness of breath. EXAM: PORTABLE CHEST 1 VIEW COMPARISON:  Radiographs 09/11/2018 and 09/09/2018. FINDINGS: 0523 hours. Mild patient rotation to the right. The right arm PICC and right subclavian pacemaker leads appear unchanged. The heart size and mediastinal contours are stable. There is a large hiatal hernia. There are persistent bilateral pleural effusions which appear mildly improved on the right. There is no pneumothorax or confluent airspace opacity. IMPRESSION: No acute findings demonstrated. The right pleural effusion appears slightly smaller. Electronically Signed   By: Richardean Sale M.D.   On: 09/14/2018 09:10   Dg Chest Port 1 View  Result Date: 09/09/2018 CLINICAL DATA:  Hypoxia, history of progressive lymphoma EXAM: PORTABLE CHEST 1 VIEW COMPARISON:  109-12-2017 FINDINGS: Cardiac shadow is enlarged. Pacing device is again seen. Bilateral pleural effusions are again noted. The nodular changes seen on recent chest CT are not as well appreciated on today's exam. No pneumothorax is noted. IMPRESSION: Bilateral effusions. Patchy densities are noted consistent findings of recent CT although less well appreciated on today's exam. Electronically Signed   By: Inez Catalina M.D.   On: 09/09/2018 06:35   Dg Chest Portable 1 View  Result Date: 08/29/2018 CLINICAL DATA:  72 year old female with a history of shortness of breath and edema EXAM: PORTABLE CHEST 1 VIEW COMPARISON:  04/19/2018, 04/18/2018 FINDINGS: Cardiomediastinal silhouette unchanged in size and contour. Double density projects over the lower mediastinum is unchanged. On the frontal view there is a Passy at the left base  obscuring the left hemidiaphragm with blunting of left costophrenic angle, increased from the prior. No pneumothorax. Unchanged right chest wall cardiac pacing device with 2 leads in place. No confluent airspace disease on the right. IMPRESSION: New opacity at the left lung base obscuring left hemidiaphragm in the left heart border, compatible with pleural effusion and associated atelectasis/consolidation. Hiatal hernia. Electronically Signed   By: Corrie Mckusick D.O.   On: 08/29/2018 09:09   Dg Abd Portable 1v  Result Date: 09/13/2018 CLINICAL DATA:  Constipation EXAM: PORTABLE ABDOMEN - 1 VIEW COMPARISON:  CT 08/31/2018 FINDINGS: Nonobstructed bowel gas pattern. Residual contrast within the colon. Mild to moderate feces retention at the rectum. IMPRESSION: Nonobstructed gas pattern with mild to moderate fecal retention at the rectum Electronically Signed   By: Donavan Foil M.D.   On: 09/13/2018 23:51   Dg Swallowing Func-speech Pathology  Result Date: 09/15/2018 Objective Swallowing Evaluation: Type of Study: MBS-Modified Barium Swallow Study  Patient Details Name: LUISANA LUTZKE MRN: [id number] Date of Birth: 06/18/1946 Today's Date: 09/15/2018 Time: SLP Start Time (ACUTE ONLY): 1210 -SLP Stop Time (ACUTE ONLY): 1240 SLP Time Calculation (min) (ACUTE ONLY): 30 min Past Medical History: Past Medical History: Diagnosis Date . Arthritis  . Hiatal hernia  . History of chicken pox  . Hyperlipidemia  . Pacemaker  Past Surgical History: Past Surgical History: Procedure Laterality Date . ANKLE SURGERY   . PACEMAKER IMPLANT N/A 04/18/2018  Procedure: PACEMAKER IMPLANT;  Surgeon: Evans Lance, MD;  Location: [facility];  Service: Cardiovascular;  Laterality: N/A; HPI: EDELINE GREENING is a 72 y.o. 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  During her  hospitalization she underwent left axillary lymph node biopsy.  She was discharged home in a stable condition, but apparently she has been developing worsening weakness, generalized, worse with exertion, no improving factors, associated with malaise, chills and poor appetite.  Over the last 24 hours her symptoms have been more severe to the point where she has difficulty standing. She has persistent left upper extremity edema which has been painful, sharp in nature up to 10 out of 10 intensity.  Her weakness has led to dyspnea which has been severe in intensity, and triggered her emergency room visit today.Pt was made NPO yesterday due to concerns for aspiration.   Today pt is alert, has congested weak cough concerrning for airway protection.   Pt underwent MBS yesterday and was placed on full liquid diet with precautions. RN reports pt continues to cough with po intake.  Per note from MD, now plan is for pt to have small bore feeding tube placed for nutritional support.  Unfortunately pt did not tolerate attempt at insertion of feeding tube.  Repeat swallow evaluation ordered.  SLP follow up to inform family/pt of plan and determine if clinically pt appears improved.    Subjective: Alert Assessment / Plan / Recommendation CHL IP CLINICAL IMPRESSIONS 09/15/2018 Clinical Impression Patient had a MBS on 09/12/18 and patient presented with Moderately severe oropharyngeal dysphagia with sensorimotor deficits. She presents much stronger today and presents with a mild oropharyngeal dysphagia. Oral preparation and bolus transfer was mild evidenced by trace oral residue. Patient initiated swallow at the level of the pyriform sinuses with no penetration or aspiration with cup sips of all sizes. She did have penetration with cup sip with straw, but spontaneously cleared layngeal vestibule. She initiated swallow at the valleculae with puree and cracker bolus, no penetration or aspiration. Significant pharyngeal residue present  after 1st swallow, but improved mostly cleared with second spontaneous swallow. Upper 1/3 of esphagus observed to clear well with puree and tablet. Due to significant sensorimotor improvement, recommend patient have regular diet with thin liquids, medication whole in puree. Speech therapy to follow up for continue diet tolerance and swallowing therapy. SLP Visit Diagnosis Dysphagia, oropharyngeal phase (R13.12) Attention and concentration deficit following -- Frontal lobe and executive function deficit following -- Impact on safety and function Mild aspiration risk   CHL IP TREATMENT RECOMMENDATION 09/15/2018 Treatment Recommendations Therapy as outlined in treatment plan below   Prognosis 09/15/2018 Prognosis for Safe Diet Advancement Good Barriers to Reach Goals -- Barriers/Prognosis Comment -- CHL IP DIET RECOMMENDATION 09/15/2018 SLP Diet Recommendations Regular solids;Thin liquid Liquid Administration via Cup;No straw Medication Administration Whole meds with liquid Compensations Slow rate;Small sips/bites;Other (Comment) Postural Changes Remain semi-upright after after feeds/meals (Comment);Seated upright at 90 degrees   CHL IP OTHER RECOMMENDATIONS 09/15/2018 Recommended Consults -- Oral Care Recommendations Oral care before and after PO Other Recommendations --   CHL IP FOLLOW UP RECOMMENDATIONS 09/14/2018 Follow up Recommendations (No Data)   CHL IP FREQUENCY AND DURATION 09/15/2018 Speech Therapy Frequency (ACUTE ONLY) min 2x/week Treatment Duration 1 week      CHL IP ORAL PHASE 09/15/2018 Oral Phase Impaired Oral - Pudding Teaspoon -- Oral - Pudding Cup Lingual/palatal residue Oral - Honey Teaspoon -- Oral - Honey Cup -- Oral - Nectar Teaspoon NT Oral - Nectar Cup NT Oral - Nectar Straw NT Oral - Thin Teaspoon Lingual/palatal residue Oral -  Thin Cup Lingual/palatal residue;Impaired mastication Oral - Thin Straw Lingual/palatal residue Oral - Puree Lingual/palatal residue Oral - Mech Soft Lingual/palatal  residue Oral - Regular -- Oral - Multi-Consistency -- Oral - Pill -- Oral Phase - Comment --  CHL IP PHARYNGEAL PHASE 09/15/2018 Pharyngeal Phase Impaired Pharyngeal- Pudding Teaspoon Delayed swallow initiation-vallecula;Pharyngeal residue - valleculae;Pharyngeal residue - pyriform Pharyngeal -- Pharyngeal- Pudding Cup NT Pharyngeal -- Pharyngeal- Honey Teaspoon -- Pharyngeal -- Pharyngeal- Honey Cup -- Pharyngeal -- Pharyngeal- Nectar Teaspoon NT Pharyngeal -- Pharyngeal- Nectar Cup NT Pharyngeal -- Pharyngeal- Nectar Straw NT Pharyngeal -- Pharyngeal- Thin Teaspoon Delayed swallow initiation-pyriform sinuses;Reduced pharyngeal peristalsis;Reduced anterior laryngeal mobility;Reduced laryngeal elevation;Reduced airway/laryngeal closure;Reduced tongue base retraction;Pharyngeal residue - valleculae;Pharyngeal residue - pyriform Pharyngeal -- Pharyngeal- Thin Cup Delayed swallow initiation-pyriform sinuses;Reduced anterior laryngeal mobility;Reduced laryngeal elevation;Reduced airway/laryngeal closure;Reduced tongue base retraction;Pharyngeal residue - valleculae;Pharyngeal residue - pyriform;Reduced pharyngeal peristalsis Pharyngeal -- Pharyngeal- Thin Straw Delayed swallow initiation-pyriform sinuses;Reduced anterior laryngeal mobility;Reduced laryngeal elevation;Reduced airway/laryngeal closure;Reduced pharyngeal peristalsis;Reduced tongue base retraction;Pharyngeal residue - valleculae;Pharyngeal residue - pyriform;Penetration/Aspiration during swallow Pharyngeal -- Pharyngeal- Puree Delayed swallow initiation-vallecula;Reduced laryngeal elevation;Reduced airway/laryngeal closure;Reduced tongue base retraction;Pharyngeal residue - valleculae Pharyngeal -- Pharyngeal- Mechanical Soft Delayed swallow initiation-vallecula;Reduced laryngeal elevation;Reduced airway/laryngeal closure;Reduced tongue base retraction Pharyngeal -- Pharyngeal- Regular NT Pharyngeal -- Pharyngeal- Multi-consistency -- Pharyngeal --  Pharyngeal- Pill Delayed swallow initiation-vallecula;Reduced laryngeal elevation;Reduced airway/laryngeal closure Pharyngeal -- Pharyngeal Comment --  CHL IP CERVICAL ESOPHAGEAL PHASE 09/15/2018 Cervical Esophageal Phase WFL Pudding Teaspoon -- Pudding Cup -- Honey Teaspoon -- Honey Cup -- Nectar Teaspoon -- Nectar Cup -- Nectar Straw -- Thin Teaspoon -- Thin Cup -- Thin Straw -- Puree -- Mechanical Soft -- Regular -- Multi-consistency -- Pill -- Cervical Esophageal Comment -- Charlynne Cousins Ward, MA, CCC-SLP 09/15/2018 2:59 PM              Dg Swallowing Func-speech Pathology  Result Date: 09/12/2018 Objective Swallowing Evaluation: Type of Study: MBS-Modified Barium Swallow Study  Patient Details Name: ARMONIE METTLER MRN: [id number] Date of Birth: 1946/01/24 Today's Date: 09/12/2018 Time: SLP Start Time (ACUTE ONLY): 1300 -SLP Stop Time (ACUTE ONLY): 1337 SLP Time Calculation (min) (ACUTE ONLY): 37 min Past Medical History: Past Medical History: Diagnosis Date . Arthritis  . Hiatal hernia  . History of chicken pox  . Hyperlipidemia  . Pacemaker  Past Surgical History: Past Surgical History: Procedure Laterality Date . ANKLE SURGERY   . PACEMAKER IMPLANT N/A 04/18/2018  Procedure: PACEMAKER IMPLANT;  Surgeon: Evans Lance, MD;  Location: [facility];  Service: Cardiovascular;  Laterality: N/A; HPI: NAKEYA ADINOLFI is a 72 y.o. 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  During her hospitalization she underwent left axillary lymph node biopsy.  She was discharged home in a stable condition, but apparently she has been developing worsening weakness, generalized, worse with exertion, no improving factors, associated with malaise, chills and poor appetite.  Over the last 24 hours her symptoms have been more severe to the point where she has difficulty standing. She has  persistent left upper extremity edema which has been painful, sharp in nature up to 10 out of 10 intensity.  Her weakness has led to dyspnea which has been severe in intensity, and triggered her emergency room visit today.Pt was made NPO yesterday due to concerns for aspiration.   Today pt is alert, has congested weak cough concerrning for  airway protection.  Subjective: The patient was seen sitting upright in bed with her daughter at the bedside.  Assessment / Plan / Recommendation CHL IP CLINICAL IMPRESSIONS 09/12/2018 Clinical Impression Pt presents with moderately severe oropharyngeal dysphagia with sensorimotor deficits.  Decreased oral propulsion and weak pharyngeal motility results in significant pharyngeal more than oral residuals.  Pt does NOT sense residuals and is unable to "hock" to expectorate to clear them.  Chin tuck, head turn postures did NOT decrease residuals.  Pt did aspirate with thin liquids posterior trachea with delayed cough response.  Although she did not aspirate with thicker consistencies, increased residuals present which if aspiration post-swallow are more difficult for pulmonary clearance.  Pt is grossly weak at this time - uncertain of her baseline swallow function.  Pt is a high aspiration/malnutrition risk due to level of dysphagia and weak nonproductive cough.  If she is to consume po intake= it should be with accepted risks and mitigation strategies.  Question if pt's swallow with improve with improved medical status. SLP Visit Diagnosis Dysphagia, oropharyngeal phase (R13.12) Attention and concentration deficit following -- Frontal lobe and executive function deficit following -- Impact on safety and function Severe aspiration risk;Risk for inadequate nutrition/hydration   CHL IP TREATMENT RECOMMENDATION 09/11/2018 Treatment Recommendations Therapy as outlined in treatment plan below   Prognosis 09/12/2018 Prognosis for Safe Diet Advancement Fair Barriers to Reach Goals Severity of  deficits;Cognitive deficits;Other (Comment) Barriers/Prognosis Comment -- CHL IP DIET RECOMMENDATION 09/12/2018 SLP Diet Recommendations (No Data) Liquid Administration via Cup Medication Administration Crushed with puree Compensations Slow rate;Small sips/bites;Follow solids with liquid Postural Changes Remain semi-upright after after feeds/meals (Comment);Seated upright at 90 degrees   CHL IP OTHER RECOMMENDATIONS 09/12/2018 Recommended Consults -- Oral Care Recommendations Oral care before and after PO Other Recommendations Have oral suction available   CHL IP FOLLOW UP RECOMMENDATIONS 09/12/2018 Follow up Recommendations (No Data)   CHL IP FREQUENCY AND DURATION 09/12/2018 Speech Therapy Frequency (ACUTE ONLY) min 2x/week Treatment Duration 1 week      CHL IP ORAL PHASE 09/12/2018 Oral Phase Impaired Oral - Pudding Teaspoon -- Oral - Pudding Cup -- Oral - Honey Teaspoon -- Oral - Honey Cup -- Oral - Nectar Teaspoon Weak lingual manipulation;Reduced posterior propulsion;Lingual/palatal residue Oral - Nectar Cup Weak lingual manipulation;Reduced posterior propulsion;Lingual/palatal residue Oral - Nectar Straw Weak lingual manipulation;Reduced posterior propulsion;Lingual/palatal residue Oral - Thin Teaspoon Weak lingual manipulation;Reduced posterior propulsion Oral - Thin Cup Weak lingual manipulation;Reduced posterior propulsion Oral - Thin Straw Reduced posterior propulsion Oral - Puree Weak lingual manipulation;Reduced posterior propulsion Oral - Mech Soft Weak lingual manipulation;Reduced posterior propulsion Oral - Regular -- Oral - Multi-Consistency -- Oral - Pill -- Oral Phase - Comment --  CHL IP PHARYNGEAL PHASE 09/12/2018 Pharyngeal Phase Impaired Pharyngeal- Pudding Teaspoon -- Pharyngeal -- Pharyngeal- Pudding Cup -- Pharyngeal -- Pharyngeal- Honey Teaspoon -- Pharyngeal -- Pharyngeal- Honey Cup -- Pharyngeal -- Pharyngeal- Nectar Teaspoon Reduced pharyngeal peristalsis;Reduced epiglottic inversion;Reduced  tongue base retraction;Pharyngeal residue - valleculae;Pharyngeal residue - pyriform Pharyngeal -- Pharyngeal- Nectar Cup Reduced pharyngeal peristalsis;Reduced epiglottic inversion;Reduced laryngeal elevation;Reduced airway/laryngeal closure;Reduced tongue base retraction;Pharyngeal residue - valleculae;Pharyngeal residue - pyriform;Penetration/Aspiration during swallow Pharyngeal Material enters airway, remains ABOVE vocal cords and not ejected out Pharyngeal- Nectar Straw Reduced pharyngeal peristalsis;Reduced epiglottic inversion;Reduced anterior laryngeal mobility;Reduced airway/laryngeal closure;Reduced tongue base retraction;Reduced laryngeal elevation;Pharyngeal residue - valleculae;Pharyngeal residue - pyriform Pharyngeal -- Pharyngeal- Thin Teaspoon Reduced pharyngeal peristalsis;Reduced epiglottic inversion;Reduced laryngeal elevation;Reduced airway/laryngeal closure;Reduced tongue base retraction;Pharyngeal residue - valleculae;Pharyngeal residue - pyriform  Pharyngeal -- Pharyngeal- Thin Cup Reduced pharyngeal peristalsis;Reduced epiglottic inversion;Reduced laryngeal elevation;Reduced airway/laryngeal closure;Reduced tongue base retraction;Pharyngeal residue - valleculae;Pharyngeal residue - pyriform;Penetration/Aspiration during swallow Pharyngeal Material enters airway, passes BELOW cords without attempt by patient to eject out (silent aspiration) Pharyngeal- Thin Straw Reduced pharyngeal peristalsis;Reduced epiglottic inversion;Reduced laryngeal elevation;Reduced airway/laryngeal closure;Reduced tongue base retraction;Pharyngeal residue - valleculae;Pharyngeal residue - pyriform Pharyngeal Material enters airway, passes BELOW cords and not ejected out despite cough attempt by patient Pharyngeal- Puree Reduced epiglottic inversion;Reduced pharyngeal peristalsis;Reduced tongue base retraction;Reduced airway/laryngeal closure;Reduced laryngeal elevation;Pharyngeal residue - valleculae;Pharyngeal residue  - pyriform Pharyngeal -- Pharyngeal- Mechanical Soft Reduced pharyngeal peristalsis;Reduced epiglottic inversion;Reduced tongue base retraction;Pharyngeal residue - valleculae;Reduced airway/laryngeal closure Pharyngeal -- Pharyngeal- Regular -- Pharyngeal -- Pharyngeal- Multi-consistency -- Pharyngeal -- Pharyngeal- Pill -- Pharyngeal -- Pharyngeal Comment --  CHL IP CERVICAL ESOPHAGEAL PHASE 09/12/2018 Cervical Esophageal Phase Impaired Pudding Teaspoon -- Pudding Cup -- Honey Teaspoon -- Honey Cup -- Nectar Teaspoon -- Nectar Cup -- Nectar Straw -- Thin Teaspoon -- Thin Cup -- Thin Straw -- Puree -- Mechanical Soft -- Regular -- Multi-consistency -- Pill -- Cervical Esophageal Comment upon esophageal sweep, pt appeared clear Luanna Salk, MS [facility] SLP Acute Rehab Services Pager [phone] Office [phone] Macario Golds 09/12/2018, 5:29 PM              [place] Core Biopsy (lymph Nodes)  Result Date: 08/30/2018 INDICATION: 72 year old with scattered lung lesions, left arm swelling and extensive left axillary lymphadenopathy. Findings are concerning for metastatic disease and tissue diagnosis is needed. EXAM: ULTRASOUND-GUIDED CORE BIOPSY OF LEFT AXILLARY LYMPH NODE MEDICATIONS: None. ANESTHESIA/SEDATION: Moderate (conscious) sedation was employed during this procedure. A total of Versed 0.5 mg and Fentanyl 50 mcg was administered intravenously. Moderate Sedation Time: 15 minutes. The patient's level of consciousness and vital signs were monitored continuously by radiology nursing throughout the procedure under my direct supervision. FLUOROSCOPY TIME:  None COMPLICATIONS: None immediate. PROCEDURE: Informed written consent was obtained from the patient after a thorough discussion of the procedural risks, benefits and alternatives. All questions were addressed. A timeout was performed prior to the initiation of the procedure. Ultrasound demonstrated enlarged left axillary lymph nodes. The left axilla was  prepped and draped in sterile fashion. Skin was anesthetized with 1% lidocaine. 18 gauge core biopsies were obtained of a round enlarged lymph node with ultrasound guidance. Total of 6 core biopsies were obtained. Specimens placed in saline. Bandage placed over the puncture site. FINDINGS: Multiple enlarged hypoechoic lymph nodes in left axilla. Biopsy needle confirmed within the targeted lymph node. No bleeding or hematoma formation at end of procedure. IMPRESSION: Ultrasound-guided core biopsy of an enlarged left axillary lymph node. Electronically Signed   By: Markus Daft M.D.   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 Limitations: Body habitus, swelling and poor ultrasound/tissue interface. Comparison Study: No prior study on file for comparison. Performing Technologist: Sharion Dove RVS  Examination Guidelines: A complete evaluation includes B-mode imaging, spectral Doppler, color Doppler, and power Doppler as needed of all accessible portions of each vessel. Bilateral testing is considered an integral part of a complete examination. Limited examinations for reoccurring indications may be performed as noted.  Right Findings: +----------+------------+----------+---------+-----------+-------+ RIGHT     CompressiblePropertiesPhasicitySpontaneousSummary +----------+------------+----------+---------+-----------+-------+ Subclavian  Yes       Yes            +----------+------------+----------+---------+-----------+-------+  Left Findings: +----------+------------+----------+---------+-----------+-------+ LEFT      CompressiblePropertiesPhasicitySpontaneousSummary +----------+------------+----------+---------+-----------+-------+ IJV                                Yes       Yes             +----------+------------+----------+---------+-----------+-------+ Subclavian                         Yes       Yes            +----------+------------+----------+---------+-----------+-------+ Axillary      Full                                          +----------+------------+----------+---------+-----------+-------+ Brachial      Full                                          +----------+------------+----------+---------+-----------+-------+ Cephalic      Full                                          +----------+------------+----------+---------+-----------+-------+ Basilic       Full                                          +----------+------------+----------+---------+-----------+-------+  Summary:  Right: No evidence of thrombosis in the subclavian.  Left: No evidence of deep vein thrombosis in the upper extremity. However, unable to visualize the radial and ulnar. No evidence of superficial vein thrombosis in the upper extremity. However, unable to visualize the radial and ulnar. No evidence of thrombosis in the . However, unable to visualize the radial and ulnar. This was a limited study.  *See table(s) above for measurements and observations.  Diagnosing physician: Deitra Mayo MD Electronically signed by Deitra Mayo MD on 08/29/2018 at 4:20:56 PM.    Final    Ir Picc Placement Right >5 Yrs Inc Img Guide  Result Date: 09/09/2018 INDICATION: Acute lymphoma, access for chemotherapy as an inpatient EXAM: ULTRASOUND AND FLUOROSCOPIC GUIDED PICC LINE INSERTION MEDICATIONS: 1% lidocaine local CONTRAST:  None FLUOROSCOPY TIME:  Twenty-four seconds (8 mGy) COMPLICATIONS: None immediate. TECHNIQUE: The procedure, risks, benefits, and alternatives were explained to the patient and informed written consent was obtained. A timeout was performed prior to the initiation of the procedure. The right upper extremity was prepped with chlorhexidine in a sterile fashion, and  a sterile drape was applied covering the operative field. Maximum barrier sterile technique with sterile gowns and gloves were used for the procedure. A timeout was performed prior to the initiation of the procedure. Local anesthesia was provided with 1% lidocaine. Under direct ultrasound guidance, the right brachial vein was accessed with a micropuncture kit after the overlying soft tissues were anesthetized with 1% lidocaine. An ultrasound image was saved for documentation purposes. A guidewire was advanced to the level of the  superior caval-atrial junction for measurement purposes and the PICC line was cut to length. A peel-away sheath was placed and a 36 cm, 5 [place], dual lumen was inserted to level of the superior caval-atrial junction. A post procedure spot fluoroscopic was obtained. The catheter easily aspirated and flushed and was sutured in place. A dressing was placed. The patient tolerated the procedure well without immediate post procedural complication. FINDINGS: After catheter placement, the tip lies within the superior cavoatrial junction. The catheter aspirates and flushes normally and is ready for immediate use. IMPRESSION: Successful ultrasound and fluoroscopic guided placement of a right brachial vein approach, 36 cm, 5 French, dual lumen PICC with tip at the superior caval-atrial junction. The PICC line is ready for immediate use. Electronically Signed   By: Jerilynn Mages.  Shick M.D.   On: 09/09/2018 09:23   [place] Ekg Site Rite  Result Date: 09/08/2018 If Site Rite image not attached, placement could not be confirmed due to current cardiac rhythm.    CBC Recent Labs  Lab 09/21/18 0607 09/22/18 0546 09/23/18 0950 09/24/18 0648 09/25/18 0523 09/26/18 0511  WBC 0.4* 2.2* 12.7* 12.9* 9.5 10.0  HGB 8.0* 7.0* 10.5* 11.0* 10.7* 10.9*  HCT 24.5* 22.1* 31.9* 33.9* 33.4* 34.3*  PLT 111* 129* 239 354 347 272  MCV 87.5 89.5 87.6 88.1 87.4 88.2  MCH 28.6 28.3 28.8 28.6 28.0 28.0  MCHC 32.7 31.7 32.9  32.4 32.0 31.8  RDW 16.1* 15.9* 15.0 15.7* 15.9* 16.5*  LYMPHSABS 0.0* 0.0* 0.1* 0.3* 0.1*  --   MONOABS 0.2 0.5 0.9 1.3* 1.0  --   EOSABS 0.0 0.0 0.0 0.0 0.0  --   BASOSABS 0.0 0.0 0.1 0.0 0.0  --     Chemistries  Recent Labs  Lab 09/21/18 0607 09/22/18 0546 09/25/18 0523 09/26/18 0511  NA 133* 137 137 135  K 3.5 3.1* 3.2* 3.6  CL 94* 106 103 103  CO2 _0 GLUCOSE 96 89 80 99  BUN _1 CREATININE 0.46 0.41* 0.41* 0.33*  CALCIUM 7.7* 7.9* 7.8* 7.5*  MG  --   --   --  1.9  AST  --  9* 17  --   ALT  --  14 18  --   ALKPHOS  --  57 84  --   BILITOT  --  1.9* 0.6  --     Lab Results  Component Value Date   HGBA1C 5.3 09/13/2012    Roxan Hockey M.D on 09/27/2018 at 5:11 PM  Pager---[phone] Go to [email or web address] - password [email or web address] for contact info  [facility] - Office  [phone]

## 2018-09-27 NOTE — Progress Notes (Signed)
Pt planning to admit to [facility] at [place]. Will need [facility] Medicare authorization. (Will be initiated once pt ready for DC with therapy notes within 24 hours at that time- pt currently on IV antibiotics which is scheduled to end Wednesday 09/28/18)  Sharren Bridge, MSW, [place] Work 09/27/2018 [phone]

## 2018-09-27 NOTE — Progress Notes (Signed)
OT Cancellation Note  Patient Details Name: Patricia Murillo MRN: [id number] DOB: 1946-09-01   Cancelled Treatment:    Reason Eval/Treat Not Completed: Other (comment)  Pt upset that hair is falling out. Pt states she is so distraught she didn't sleep last night.  Declines therapy at this time despite encouragement  Kari Baars, [place] [phone] Office- [place] 09/27/2018, 10:08 AM

## 2018-09-28 LAB — BASIC METABOLIC PANEL WITH GFR
Anion gap: 8 (ref 5–15)
BUN: 10 mg/dL (ref 8–23)
CO2: 24 mmol/L (ref 22–32)
Calcium: 8 mg/dL — ABNORMAL LOW (ref 8.9–10.3)
Chloride: 106 mmol/L (ref 98–111)
Creatinine, Ser: 0.38 mg/dL — ABNORMAL LOW (ref 0.44–1.00)
GFR calc Af Amer: >60 mL/min
GFR calc non Af Amer: >60 mL/min
Glucose, Bld: 100 mg/dL — ABNORMAL HIGH (ref 70–99)
Potassium: 4.3 mmol/L (ref 3.5–5.1)
Sodium: 138 mmol/L (ref 135–145)

## 2018-09-28 LAB — CULTURE, BLOOD (ROUTINE X 2)
Culture: NO GROWTH
Special Requests: ADEQUATE

## 2018-09-28 LAB — CBC
HCT: 33.8 % — ABNORMAL LOW (ref 36.0–46.0)
Hemoglobin: 10.6 g/dL — ABNORMAL LOW (ref 12.0–15.0)
MCH: 28.5 pg (ref 26.0–34.0)
MCHC: 31.4 g/dL (ref 30.0–36.0)
MCV: 90.9 fL (ref 80.0–100.0)
Platelets: 502 10*3/uL — ABNORMAL HIGH (ref 150–400)
RBC: 3.72 MIL/uL — ABNORMAL LOW (ref 3.87–5.11)
RDW: 17.2 % — AB (ref 11.5–15.5)
WBC: 12.4 10*3/uL — ABNORMAL HIGH (ref 4.0–10.5)
nRBC: 0 % (ref 0.0–0.2)

## 2018-09-28 MED ORDER — DEXTROMETHORPHAN POLISTIREX ER 30 MG/5ML PO SUER
30.0000 mg | Freq: Two times a day (BID) | ORAL | Status: DC
  Administered 2018-09-28 – 2018-09-29 (×4): 30 mg via ORAL
  Filled 2018-09-28 (×5): qty 5

## 2018-09-28 NOTE — Progress Notes (Signed)
Patient Demographics:    Patricia Murillo, is a 72 y.o. female, DOB - [birth year], CMK:[id number]  Admit date - 09/07/2018   Admitting Physician Mauricio Gerome Apley, MD  Outpatient Primary MD for the patient is Patient, No Pcp Per  LOS - 21   Chief Complaint  Patient presents with  . 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 with newly diagnosed high-grade B-cell lymphoma, recently discharged from hospital after biopsy, presented to the emergency room with progressive weakness, dyspnea, chills and fever, anorexia, and extremity edema, found to have  Pseudomonas bacteremia, as well as enterococcus and Pseudomonas UTI, currently on IV Levaquin , awaiting insurance approval for  transfer to SNF rehab   PLan:-  1) Neutropenic Fevers/Pseudomonas Aeruginosa Bacteremia/Pseudomonas UTI--clinically much improved, currently afebrile, WBC  12.9, Granix discontinued ,  blood cultures from 09/21/2018 with pseudomonas aeruginosa, urine culture also with Pseudomonas aeruginosa AND enterococcus "faecalis",, chest x-ray without acute findings, treated with IV cefepime started 09/21/2018 thru 09/24/18, verbal consultation on 09/22/18 with Dr Talbot Grumbling and ID Pharmacist , verbal consultation with Dr Michel Bickers on 09/24/18, stopped cefepime, start IV Levaquin 750 daily for 5 days starting on 09/24/2018 (Last dose 09/28/18) to cover for  both Pseudomonas and enterococcus based on urine culture  2)Diffuse large B-cell lymphoma, activated B-cell type--- per Dr Burr Medico [facility] should be continued once acute issues resolve, next dose 09/30/18, continue prednisone 20 mg daily  3)Thrombocytopenia--due to lymphoma and chemo , thrombocytopenia has resolved,, platelets now > 354k  4)Autoimmune hemolytic anemia, 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 Burr Medico  Appreciated  5)Hypercalcemia secondary to malignancy- s/p Zometa  6)Acute hypoxic respiratory failure. Likely related to progressive high-grade B-cell lymphoma with pulmonary involvement comp located by suspected aspiration--- respiratory status appears back to baseline--- continue bronchodilators, IV Levaquin as above #1 (last dose 09/28/18)  7)Acute toxic and metabolic encephalopathy----mentation is back to baseline as per family, it was multi-factorial including malignancy, encephalopathy has now resolved , hypercalcemia and obviously infection,   8)Moderate protein caloric malnutrition----due to underlying malignancy and poor oral intake, patient has low albumin and edema--- nutritional supplements advised, c/n Megace for appetite stimulation  9)Significant left upper extremity swelling and weakness----due to lymphatic obstruction by high-grade lymphoma, venous Dopplers negative of the DVT on 08/29/2018  10)unstageable pressure injury in the sacrococcygeal area- 3.5cm x 1.5cm x 0cm --- please see wound care recommendations from ostomy RN dated 09/22/2018  Disposition/Need for in-Hospital Stay- patient unable to be discharged at this time due  to Pseudomonas bacteremia and UTI in an immunocompromised patient requiring IV antibiotics, also awaiting insurance approval for SNF rehab   Code Status : FULL     Disposition Plan  : SNF rehab  Consults  :  oncology   DVT Prophylaxis  :  Lovenox/TEDs - SCDs   Lab Results  Component Value Date   PLT 502 (H) 09/28/2018    Inpatient Medications  Scheduled Meds: . acyclovir  400 mg Oral BID  . allopurinol  300 mg Oral Daily  . amLODipine  5 mg Oral Daily  . cholecalciferol  2,000 Units Oral Daily  . collagenase   Topical Daily  . dextromethorphan  30 mg Oral BID  . enoxaparin (LOVENOX) injection  40 mg Subcutaneous Q24H  . famotidine  20 mg Oral Daily  . feeding supplement (ENSURE ENLIVE)  237 mL Oral BID BM  . fluticasone  2 spray Each Nare Daily  . folic acid  1 mg Oral Daily  . guaiFENesin  600 mg Oral BID  . lactobacillus acidophilus & bulgar  1 tablet Oral TID WC  . levalbuterol  0.63 mg Nebulization BID  . loratadine  10 mg Oral Daily  . mouth rinse  15 mL Mouth Rinse BID  . megestrol  400 mg Oral Daily  . mupirocin ointment   Topical BID  . pantoprazole  40 mg Oral BID  . polyethylene glycol  17 g Oral Daily  . predniSONE  20 mg Oral Q breakfast  . senna-docusate  1 tablet Oral BID   Continuous Infusions: . sodium chloride Stopped (09/27/18 2114)  . sodium chloride Stopped (09/28/18 1533)  . levofloxacin (LEVAQUIN) IV 750 mg (09/28/18 1533)   PRN Meds:.sodium chloride, sodium chloride, acetaminophen **OR** [DISCONTINUED] acetaminophen, alteplase, alum & mag hydroxide-simeth **AND** lidocaine, benzonatate, lidocaine, lip balm, meclizine, ondansetron **OR** ondansetron (ZOFRAN) IV, oxyCODONE, sodium chloride flush    Anti-infectives (From admission, onward)   Start     Dose/Rate Route Frequency Ordered Stop   09/24/18 1500  levofloxacin (LEVAQUIN) IVPB 750 mg     750 mg 100 mL/hr over 90 Minutes Intravenous Every 24 hours 09/24/18 1023 09/29/18 1459   09/21/18 1200  ceFEPIme (MAXIPIME) 2 g in sodium chloride 0.9 % 100 mL IVPB     2 g 200 mL/hr over 30 Minutes Intravenous Every 8 hours 09/21/18 1027 09/24/18 1230    09/12/18 1600  ampicillin-sulbactam (UNASYN) 1.5 g in sodium chloride 0.9 % 100 mL IVPB  Status:  Discontinued     1.5 g 200 mL/hr over 30 Minutes Intravenous Every 6 hours 09/12/18 1408 09/15/18 0753   09/10/18 1000  acyclovir (ZOVIRAX) 200 MG capsule 400 mg     400 mg Oral 2 times daily 09/10/18 0754     09/07/18 2000  cefTRIAXone (ROCEPHIN) 1 g in sodium chloride 0.9 % 100 mL IVPB  Status:  Discontinued     1 g 200 mL/hr over 30 Minutes Intravenous Every 24 hours 09/07/18 1844 09/12/18 0940   09/07/18 0830  ceFEPIme (MAXIPIME) 2 g in sodium chloride 0.9 % 100 mL IVPB     2 g 200 mL/hr over 30 Minutes Intravenous  Once 09/07/18 0820 09/07/18 0912   09/07/18 0830  metroNIDAZOLE (FLAGYL) IVPB 500 mg  Status:  Discontinued     500 mg 100 mL/hr over 60 Minutes Intravenous Every 8 hours 09/07/18 0820 09/08/18 1812   09/07/18 0830  vancomycin (VANCOCIN) IVPB 1000 mg/200 mL premix     1,000 mg 200 mL/hr  over 60 Minutes Intravenous  Once 09/07/18 0820 09/07/18 1157        Objective:   Vitals:   09/27/18 2128 09/28/18 0628 09/28/18 1019 09/28/18 1252  BP: 140/64 139/75  (!) 140/57  Pulse: 85 90  86  Resp: _0 Temp: 98 F (36.7 C) 98.4 F (36.9 C)  98.2 F (36.8 C)  TempSrc: Oral Oral  Oral  SpO2: 94% 96% 95% 93%  Weight:      Height:        Wt Readings from Last 3 Encounters:  09/27/18 110.2 kg  08/30/18 90.2 kg  08/04/18 83.5 kg     Intake/Output Summary (Last 24 hours) at 09/28/2018 1545 Last data filed at 09/28/2018 1542 Gross per 24 hour  Intake 809.29 ml  Output -  Net 809.29 ml     Physical Exam Patient is examined daily including today on 09/27/18 , exams remain the same as of yesterday except that has changed   Gen:- Awake,  Alert,  In no apparent distress  HEENT:- [place].AT, No sclera icterus Neck-Supple Neck,No JVD,.  Lungs-  CTAB , fair symmetrical air movement CV- S1, S2 normal, regular  Abd-  +ve B.Sounds, Abd Soft, No tenderness,    Extremity:-  pedal pulses present, improving edema of left upper extremity, bilateral lower extremity edema improving slowly, right upper extremity PICC line site w/o evidence of inflammation or infection Psych-affect is appropriate, and is coherent, oriented x3 Neuro-generalized weakness but no new focal deficits, no tremors Skin-unstageable pressure injury in the sacrococcygeal area- 3.5cm x 1.5cm x 0cm --- please see photo below Media Information   Document Information   Photos    09/23/2018 12:28  Attached To:  Hospital Encounter on 09/07/18  Source Information   Roxan Hockey, MD  [facility]     Data Review:   Micro Results Recent Results (from the past 240 hour(s))  Culture, blood (routine x 2)     Status: Abnormal   Collection Time: 09/21/18  8:13 AM  Result Value Ref Range Status   Specimen Description   Final    BLOOD RIGHT ANTECUBITAL Performed at [facility], [address], [place], [place] [address]    Special Requests   Final    BOTTLES DRAWN AEROBIC AND ANAEROBIC Blood Culture adequate volume Performed at [place] [address] [place], [place] [address]    Culture  Setup Time   Final    GRAM NEGATIVE RODS AEROBIC BOTTLE ONLY CRITICAL RESULT CALLED TO, READ BACK BY AND VERIFIED WITHLavell Luster [facility] 3790 09/22/18 A BROWNING Performed at [facility], [place] [address] [place], [place] [address]    Culture PSEUDOMONAS AERUGINOSA (A)  Final   Report Status 09/24/2018 FINAL  Final   Organism ID, Bacteria PSEUDOMONAS AERUGINOSA  Final      Susceptibility   Pseudomonas aeruginosa - MIC*    CEFTAZIDIME 4 SENSITIVE Sensitive     CIPROFLOXACIN <=0.25 SENSITIVE Sensitive     GENTAMICIN <=1 SENSITIVE Sensitive     IMIPENEM 2 SENSITIVE Sensitive     PIP/TAZO <=4 SENSITIVE Sensitive     CEFEPIME 4 SENSITIVE Sensitive     * PSEUDOMONAS AERUGINOSA  Blood Culture ID Panel (Reflexed)     Status: Abnormal   Collection Time:  09/21/18  8:13 AM  Result Value Ref Range Status   Enterococcus species NOT DETECTED NOT DETECTED Final   Listeria monocytogenes NOT DETECTED NOT DETECTED Final  Staphylococcus species NOT DETECTED NOT DETECTED Final   Staphylococcus aureus (BCID) NOT DETECTED NOT DETECTED Final   Streptococcus species NOT DETECTED NOT DETECTED Final   Streptococcus agalactiae NOT DETECTED NOT DETECTED Final   Streptococcus pneumoniae NOT DETECTED NOT DETECTED Final   Streptococcus pyogenes NOT DETECTED NOT DETECTED Final   Acinetobacter baumannii NOT DETECTED NOT DETECTED Final   Enterobacteriaceae species NOT DETECTED NOT DETECTED Final   Enterobacter cloacae complex NOT DETECTED NOT DETECTED Final   Escherichia coli NOT DETECTED NOT DETECTED Final   Klebsiella oxytoca NOT DETECTED NOT DETECTED Final   Klebsiella pneumoniae NOT DETECTED NOT DETECTED Final   Proteus species NOT DETECTED NOT DETECTED Final   Serratia marcescens NOT DETECTED NOT DETECTED Final   Carbapenem resistance NOT DETECTED NOT DETECTED Final   Haemophilus influenzae NOT DETECTED NOT DETECTED Final   Neisseria meningitidis NOT DETECTED NOT DETECTED Final   Pseudomonas aeruginosa DETECTED (A) NOT DETECTED Final    Comment: CRITICAL RESULT CALLED TO, READ BACK BY AND VERIFIED WITHLavell Luster [facility] 6144 09/22/18 A BROWNING    Candida albicans NOT DETECTED NOT DETECTED Final   Candida glabrata NOT DETECTED NOT DETECTED Final   Candida krusei NOT DETECTED NOT DETECTED Final   Candida parapsilosis NOT DETECTED NOT DETECTED Final   Candida tropicalis NOT DETECTED NOT DETECTED Final    Comment: Performed at [facility], [address], [place], [place] [address]  Culture, blood (routine x 2)     Status: None   Collection Time: 09/21/18  8:56 AM  Result Value Ref Range Status   Specimen Description   Final    BLOOD RIGHT HAND Performed at [place] [address] [place], [place] [address]     Special Requests   Final    BOTTLES DRAWN AEROBIC AND ANAEROBIC Blood Culture results may not be optimal due to an inadequate volume of blood received in culture bottles Performed at [place] [address], [place], [place] [address]    Culture   Final    NO GROWTH 5 DAYS Performed at [facility], [place] [address], [place], [place] [address]    Report Status 09/26/2018 FINAL  Final  Urine Culture     Status: Abnormal   Collection Time: 09/21/18  3:43 PM  Result Value Ref Range Status   Specimen Description   Final    URINE, CLEAN CATCH Performed at [facility], [place] [address] [place], [place] [address]    Special Requests   Final    Immunocompromised Performed at [facility], [place] [address] [place], [place] [address]    Culture (A)  Final    20,000 COLONIES/mL PSEUDOMONAS AERUGINOSA 20,000 COLONIES/mL ENTEROCOCCUS FAECALIS    Report Status 09/24/2018 FINAL  Final   Organism ID, Bacteria PSEUDOMONAS AERUGINOSA (A)  Final   Organism ID, Bacteria ENTEROCOCCUS FAECALIS (A)  Final      Susceptibility   Enterococcus faecalis - MIC*    AMPICILLIN <=2 SENSITIVE Sensitive     LEVOFLOXACIN 1 SENSITIVE Sensitive     NITROFURANTOIN <=16 SENSITIVE Sensitive     VANCOMYCIN 1 SENSITIVE Sensitive     * 20,000 COLONIES/mL ENTEROCOCCUS FAECALIS   Pseudomonas aeruginosa - MIC*    CEFTAZIDIME 2 SENSITIVE Sensitive     CIPROFLOXACIN <=0.25 SENSITIVE Sensitive     GENTAMICIN <=1 SENSITIVE Sensitive     IMIPENEM 2 SENSITIVE Sensitive     PIP/TAZO 8 SENSITIVE Sensitive  CEFEPIME 2 SENSITIVE Sensitive     * 20,000 COLONIES/mL PSEUDOMONAS AERUGINOSA  Culture, blood (Routine X 2) w Reflex to ID Panel     Status: None   Collection Time: 09/23/18  6:17 PM  Result Value Ref Range Status   Specimen Description BLOOD RIGHT ARM  Final   Special Requests   Final    BOTTLES DRAWN AEROBIC AND ANAEROBIC Blood Culture adequate  volume Performed at [place] [address], [place], [place] [address]    Culture NO GROWTH 5 DAYS  Final   Report Status 09/28/2018 FINAL  Final    Radiology Reports Dg Chest 2 View  Result Date: 09/17/2018 CLINICAL DATA:  Cough. EXAM: CHEST - 2 VIEW COMPARISON:  Radiograph of September 14, 2018. FINDINGS: Stable cardiomediastinal silhouette. Large hiatal hernia is noted. No pneumothorax is noted. Right-sided pacemaker is unchanged in position. Mild bilateral pleural effusions are noted with associated atelectasis. Bony thorax is unremarkable. IMPRESSION: Stable large hiatal hernia. Mild bilateral pleural effusions are noted with associated atelectasis. Electronically Signed   By: Marijo Conception, M.D.   On: 09/17/2018 14:16   Dg Chest 2 View  Result Date: 09/07/2018 CLINICAL DATA:  Increasing shortness of breath. Decreased oxygen saturation since yesterday. EXAM: CHEST - 2 VIEW COMPARISON:  Single-view of the chest and CT chest 08/29/2018. FINDINGS: There is cardiomegaly without edema. Moderate left pleural effusion and basilar atelectasis are seen. Pulmonary nodules in the left upper lobe and along the periphery of the left lung are identified as seen on prior CT. Small right pulmonary nodule seen on CT are difficult to visualize on this examination. Large hiatal hernia is noted. No pneumothorax. IMPRESSION: No change in a moderate left pleural effusion and left basilar atelectasis. Pulmonary nodules. Cardiomegaly without edema. Hiatal hernia. Electronically Signed   By: Inge Rise M.D.   On: 12020/09/2318 09:02   Ct Head Wo Contrast  Result Date: 09/07/2018 CLINICAL DATA:  Sepsis. Recent diagnosis of suspected metastatic disease in chest abdomen and pelvis on CT studies. New left sided proptosis and left upper and lower extremity weakness. No reported injury. EXAM: CT HEAD WITHOUT CONTRAST TECHNIQUE: Contiguous axial images were obtained from the base of the skull  through the vertex without intravenous contrast. COMPARISON:  07/15/2017 head CT. FINDINGS: Brain: No evidence of parenchymal hemorrhage or extra-axial fluid collection. No mass lesion, mass effect, or midline shift. No CT evidence of acute infarction. Nonspecific mild subcortical and periventricular white matter hypodensity, most in keeping with chronic small vessel ischemic change. Cerebral volume is age appropriate. No ventriculomegaly. Vascular: No acute abnormality. Skull: No evidence of calvarial fracture. Sinuses/Orbits: No fluid levels. Mild mucoperiosteal thickening in the maxillary sinuses bilaterally. Other:  The mastoid air cells are unopacified. IMPRESSION: 1.  No evidence of acute intracranial abnormality. 2. Mild chronic small vessel ischemic changes in the cerebral white matter. 3. Mild chronic appearing paranasal sinusitis. Electronically Signed   By: Ilona Sorrel M.D.   On: 12020/09/2318 12:06   Ct Angio Chest Pe W/cm &/or Wo Cm  Result Date: 09/07/2018 CLINICAL DATA:  Shortness of breath and weakness. 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 EXAM: CT ANGIOGRAPHY CHEST WITH CONTRAST TECHNIQUE: Multidetector CT imaging of the chest was performed using the standard protocol during bolus administration of intravenous contrast. Multiplanar CT image reconstructions and MIPs were obtained to evaluate the vascular anatomy. CONTRAST:  198m ISOVUE-370 IOPAMIDOL (ISOVUE-370) INJECTION 76% COMPARISON:  CTA of the chest on  08/29/2018 FINDINGS: Cardiovascular: The pulmonary arteries are adequately opacified. There is no evidence of pulmonary embolism. The thoracic aorta is normal in caliber and demonstrates no evidence of aneurysm or dissection. Proximal great vessels are normally patent. The heart size is stable and at the upper limits of normal. Pacemaker shows stable positioning. No pericardial fluid identified. Mediastinum/Nodes: Massive confluent lymphadenopathy  of the left axilla extending into the left subpectoral region and encasing the region of the brachial plexus, left axillary vessels and left subclavian vessels appears progressively enlarged since the prior CTA. Index lateral subpectoral lymph node measured previously at 2.8 cm in short axis now measures approximately 4.1 cm. More inferior left axillary node measured at 2.5 cm in short axis previously now measures 3.1 cm. Confluent lymph node mass encasement of vessels does not cause arterial occlusion but may be causing significant mass effect on venous structures. The veins are not opacified on the study to determine whether any venous thrombus is present. Associated right hilar and mediastinal lymphadenopathy also appears slightly more prominent compared to the prior study. Largest area right hilar lymphadenopathy measures approximately 2.2 cm. Stable large hiatal hernia. Lungs/Pleura: There also is progressive pulmonary involvement with lymphoma with definite progression since the prior CTA. All of the multiple bilateral pulmonary nodules show enlargement since the prior study. Index mass in the subpleural left lower lobe now measures approximately 2.2 x 3.7 cm (previously 1.7 x 3.3 cm). All other lesions also show enlargement. New small right pleural effusion. Small to moderate left pleural effusion appears stable since the prior study. Upper Abdomen: Stable splenomegaly. Musculoskeletal: No chest wall abnormality. No acute or significant osseous findings. Review of the MIP images confirms the above findings. IMPRESSION: 1. No evidence of pulmonary embolism. 2. Progression of lymphoma since the prior CTA on 11/25 with enlargement of left axillary, subpectoral and chest wall lymph node masses with encasement of left subclavian and axillary vascular structures. Enlargement of right hilar and mediastinal lymph nodes since the prior study. Pulmonary involvement also shows significant progression with enlargement of  all of the bilateral pulmonary nodule seen previously. 3. Normal arterial patency demonstrated of the left subclavian and axillary arteries. Venous patency cannot be determined on the CTA study and the patient would be at high risk of left upper extremity DVT given the large bulky lymphadenopathy present. 4. New small right pleural effusion. Stable small to moderate left pleural effusion. 5. Stable splenomegaly. Electronically Signed   By: Aletta Edouard M.D.   On: 125-Nov-202019 12:29   Ct Abdomen Pelvis W Contrast  Result Date: 08/31/2018 CLINICAL DATA:  Pulmonary nodules on previous chest CT suspicious for metastatic disease. EXAM: CT ABDOMEN AND PELVIS WITH CONTRAST TECHNIQUE: Multidetector CT imaging of the abdomen and pelvis was performed using the standard protocol following bolus administration of intravenous contrast. CONTRAST:  175m OMNIPAQUE IOHEXOL 300 MG/ML  SOLN COMPARISON:  Chest CT 08/29/2018 FINDINGS: Lower chest: Pacer wires noted in the heart. Large hiatal hernia. Bilateral pleural effusions, left greater than right. Pulmonary nodules noted in both lower lungs. Hepatobiliary: No focal abnormality within the liver parenchyma. Liver measures 20.1 cm craniocaudal length. Dependent sludge or stones noted in the gallbladder. No intrahepatic or extrahepatic biliary dilation. Pancreas: No focal mass lesion. No dilatation of the main duct. No intraparenchymal cyst. No peripancreatic edema. Spleen: Spleen measures 13.7 cm in craniocaudal length, upper normal to mildly increased. Adrenals/Urinary Tract: No adrenal nodule or mass. Kidneys unremarkable. Central sinus cysts noted left kidney. No evidence for hydroureter. The  urinary bladder appears normal for the degree of distention. Stomach/Bowel: Large hiatal hernia. Duodenum is normally positioned as is the ligament of Treitz. Duodenal diverticulum noted. No small bowel wall thickening. No small bowel dilatation. The terminal ileum is normal. The  appendix is not visualized, but there is no edema or inflammation in the region of the cecum. No gross colonic mass. No colonic wall thickening. Diverticular changes are noted in the left colon without evidence of diverticulitis. Vascular/Lymphatic: There is abdominal aortic atherosclerosis without aneurysm. Mild lymphadenopathy noted hepato duodenal ligament with 13 mm short axis index node visible on 21/3. Borderline para-aortic retroperitoneal lymphadenopathy evident. 10 mm short axis left para-aortic lymph node visible on 31/3. relatively bulky lymphadenopathy is seen along both pelvic sidewalls. 2.2 cm short axis left external iliac node is visible on 76/3. 2.8 cm short axis right external iliac lymph node associated with another 2.9 cm short axis node. 2.2 cm short axis lymph node identified in the right groin. Reproductive: Uterus unremarkable.  There is no adnexal mass. Other: No intraperitoneal free fluid. Musculoskeletal: Extensive body wall edema noted left lower hemithorax and left upper abdominal wall. Degenerative changes noted left hip. IMPRESSION: 1. Bulky pelvic sidewall lymphadenopathy with borderline to mild lymphadenopathy in the hepato duodenal ligament and para-aortic retroperitoneal space. Lymphoma or metastatic disease would be primary considerations. 2. Hepatomegaly with borderline splenomegaly. Prominent portal and splenic veins raise the question of portal venous hypertension. 3. Gallbladder sludge or tiny stones. 4. Left chest wall and upper abdominal wall edema. 5. Large hiatal hernia with bilateral pleural effusions, similar to prior. 6.  Aortic Atherosclerois (ICD10-170.0) Electronically Signed   By: Misty Stanley M.D.   On: 08/31/2018 13:13   Dg Chest Port 1 View  Result Date: 09/21/2018 CLINICAL DATA:  Tachypnea. EXAM: PORTABLE CHEST 1 VIEW COMPARISON:  Chest radiographs 2 days prior, chest CT [id number] FINDINGS: Right-sided pacemaker in place. Right upper extremity PICC in  place, tip in the proximal SVC. The patient is significantly rotated. Bilateral pleural effusions and associated airspace disease, likely progressed at the left lung base. Nodular density in the periphery of the left lung again seen. Large retrocardiac hiatal hernia. Unchanged heart size and mediastinal contours. No pulmonary edema. IMPRESSION: Bilateral pleural effusions, increased on the left. Unchanged radiographic appearance of large hiatal hernia. Peripheral nodular opacity as seen on prior chest CT. Additional pulmonary nodules on CT are not visualized radiographically Electronically Signed   By: Keith Rake M.D.   On: 09/21/2018 03:42   Dg Chest Port 1 View  Addendum Date: 09/14/2018   ADDENDUM REPORT: 09/14/2018 09:14 ADDENDUM: The impression should read: Stable chest x-ray demonstrating pulmonary edema and bilateral pleural effusions. Electronically Signed   By: Aletta Edouard M.D.   On: 09/14/2018 09:14   Result Date: 09/14/2018 CLINICAL DATA:  Shortness of breath. History of progressive high-grade B-cell lymphoma. EXAM: PORTABLE CHEST 1 VIEW COMPARISON:  Chest x-ray on 09/09/2018 and CT of the chest on 108-24-2019. FINDINGS: Stable heart size, appearance of pacemaker and large hiatal hernia. Stable probable bilateral pleural effusions and bilateral pulmonary nodules. No overt pulmonary edema. No pneumothorax. IMPRESSION: Stable chest x-ray demonstrating pulmonary lymphoma and bilateral pleural effusions. Electronically Signed: By: Aletta Edouard M.D. On: 09/11/2018 14:45   Dg Chest Port 1 View  Result Date: 09/14/2018 CLINICAL DATA:  Shortness of breath. EXAM: PORTABLE CHEST 1 VIEW COMPARISON:  Radiographs 09/11/2018 and 09/09/2018. FINDINGS: 0523 hours. Mild patient rotation to the right. The right arm PICC and right subclavian  pacemaker leads appear unchanged. The heart size and mediastinal contours are stable. There is a large hiatal hernia. There are persistent bilateral pleural  effusions which appear mildly improved on the right. There is no pneumothorax or confluent airspace opacity. IMPRESSION: No acute findings demonstrated. The right pleural effusion appears slightly smaller. Electronically Signed   By: Richardean Sale M.D.   On: 09/14/2018 09:10   Dg Chest Port 1 View  Result Date: 09/09/2018 CLINICAL DATA:  Hypoxia, history of progressive lymphoma EXAM: PORTABLE CHEST 1 VIEW COMPARISON:  [id number] FINDINGS: Cardiac shadow is enlarged. Pacing device is again seen. Bilateral pleural effusions are again noted. The nodular changes seen on recent chest CT are not as well appreciated on today's exam. No pneumothorax is noted. IMPRESSION: Bilateral effusions. Patchy densities are noted consistent findings of recent CT although less well appreciated on today's exam. Electronically Signed   By: Inez Catalina M.D.   On: 09/09/2018 06:35   Dg Abd Portable 1v  Result Date: 09/13/2018 CLINICAL DATA:  Constipation EXAM: PORTABLE ABDOMEN - 1 VIEW COMPARISON:  CT 08/31/2018 FINDINGS: Nonobstructed bowel gas pattern. Residual contrast within the colon. Mild to moderate feces retention at the rectum. IMPRESSION: Nonobstructed gas pattern with mild to moderate fecal retention at the rectum Electronically Signed   By: Donavan Foil M.D.   On: 09/13/2018 23:51   Dg Swallowing Func-speech Pathology  Result Date: 09/15/2018 Objective Swallowing Evaluation: Type of Study: MBS-Modified Barium Swallow Study  Patient Details Name: KESIA DALTO MRN: [id number] Date of Birth: 01-11-1946 Today's Date: 09/15/2018 Time: SLP Start Time (ACUTE ONLY): 1210 -SLP Stop Time (ACUTE ONLY): 1240 SLP Time Calculation (min) (ACUTE ONLY): 30 min Past Medical History: Past Medical History: Diagnosis Date . Arthritis  . Hiatal hernia  . History of chicken pox  . Hyperlipidemia  . Pacemaker  Past Surgical History: Past Surgical History: Procedure Laterality Date . ANKLE SURGERY   . PACEMAKER IMPLANT N/A  04/18/2018  Procedure: PACEMAKER IMPLANT;  Surgeon: Evans Lance, MD;  Location: [facility];  Service: Cardiovascular;  Laterality: N/A; HPI: VERNEAL WIERS is a 72 y.o. 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  During her hospitalization she underwent left axillary lymph node biopsy.  She was discharged home in a stable condition, but apparently she has been developing worsening weakness, generalized, worse with exertion, no improving factors, associated with malaise, chills and poor appetite.  Over the last 24 hours her symptoms have been more severe to the point where she has difficulty standing. She has persistent left upper extremity edema which has been painful, sharp in nature up to 10 out of 10 intensity.  Her weakness has led to dyspnea which has been severe in intensity, and triggered her emergency room visit today.Pt was made NPO yesterday due to concerns for aspiration.   Today pt is alert, has congested weak cough concerrning for airway protection.   Pt underwent MBS yesterday and was placed on full liquid diet with precautions. RN reports pt continues to cough with po intake.  Per note from MD, now plan is for pt to have small bore feeding tube placed for nutritional support.  Unfortunately pt did not tolerate attempt at insertion of feeding tube.  Repeat swallow evaluation ordered.  SLP follow up to inform family/pt of plan and determine if clinically pt appears improved.    Subjective:  Alert Assessment / Plan / Recommendation CHL IP CLINICAL IMPRESSIONS 09/15/2018 Clinical Impression Patient had a MBS on 09/12/18 and patient presented with Moderately severe oropharyngeal dysphagia with sensorimotor deficits. She presents much stronger today and presents with a mild oropharyngeal dysphagia. Oral preparation and bolus transfer was mild evidenced by  trace oral residue. Patient initiated swallow at the level of the pyriform sinuses with no penetration or aspiration with cup sips of all sizes. She did have penetration with cup sip with straw, but spontaneously cleared layngeal vestibule. She initiated swallow at the valleculae with puree and cracker bolus, no penetration or aspiration. Significant pharyngeal residue present after 1st swallow, but improved mostly cleared with second spontaneous swallow. Upper 1/3 of esphagus observed to clear well with puree and tablet. Due to significant sensorimotor improvement, recommend patient have regular diet with thin liquids, medication whole in puree. Speech therapy to follow up for continue diet tolerance and swallowing therapy. SLP Visit Diagnosis Dysphagia, oropharyngeal phase (R13.12) Attention and concentration deficit following -- Frontal lobe and executive function deficit following -- Impact on safety and function Mild aspiration risk   CHL IP TREATMENT RECOMMENDATION 09/15/2018 Treatment Recommendations Therapy as outlined in treatment plan below   Prognosis 09/15/2018 Prognosis for Safe Diet Advancement Good Barriers to Reach Goals -- Barriers/Prognosis Comment -- CHL IP DIET RECOMMENDATION 09/15/2018 SLP Diet Recommendations Regular solids;Thin liquid Liquid Administration via Cup;No straw Medication Administration Whole meds with liquid Compensations Slow rate;Small sips/bites;Other (Comment) Postural Changes Remain semi-upright after after feeds/meals (Comment);Seated upright at 90 degrees   CHL IP OTHER RECOMMENDATIONS 09/15/2018 Recommended Consults -- Oral Care Recommendations Oral care before and after PO Other Recommendations --   CHL IP FOLLOW UP RECOMMENDATIONS 09/14/2018 Follow up Recommendations (No Data)   CHL IP FREQUENCY AND DURATION 09/15/2018 Speech Therapy Frequency (ACUTE ONLY) min 2x/week Treatment Duration 1 week      CHL IP ORAL PHASE 09/15/2018 Oral Phase Impaired Oral - Pudding Teaspoon  -- Oral - Pudding Cup Lingual/palatal residue Oral - Honey Teaspoon -- Oral - Honey Cup -- Oral - Nectar Teaspoon NT Oral - Nectar Cup NT Oral - Nectar Straw NT Oral - Thin Teaspoon Lingual/palatal residue Oral - Thin Cup Lingual/palatal residue;Impaired mastication Oral - Thin Straw Lingual/palatal residue Oral - Puree Lingual/palatal residue Oral - Mech Soft Lingual/palatal residue Oral - Regular -- Oral - Multi-Consistency -- Oral - Pill -- Oral Phase - Comment --  CHL IP PHARYNGEAL PHASE 09/15/2018 Pharyngeal Phase Impaired Pharyngeal- Pudding Teaspoon Delayed swallow initiation-vallecula;Pharyngeal residue - valleculae;Pharyngeal residue - pyriform Pharyngeal -- Pharyngeal- Pudding Cup NT Pharyngeal -- Pharyngeal- Honey Teaspoon -- Pharyngeal -- Pharyngeal- Honey Cup -- Pharyngeal -- Pharyngeal- Nectar Teaspoon NT Pharyngeal -- Pharyngeal- Nectar Cup NT Pharyngeal -- Pharyngeal- Nectar Straw NT Pharyngeal -- Pharyngeal- Thin Teaspoon Delayed swallow initiation-pyriform sinuses;Reduced pharyngeal peristalsis;Reduced anterior laryngeal mobility;Reduced laryngeal elevation;Reduced airway/laryngeal closure;Reduced tongue base retraction;Pharyngeal residue - valleculae;Pharyngeal residue - pyriform Pharyngeal -- Pharyngeal- Thin Cup Delayed swallow initiation-pyriform sinuses;Reduced anterior laryngeal mobility;Reduced laryngeal elevation;Reduced airway/laryngeal closure;Reduced tongue base retraction;Pharyngeal residue - valleculae;Pharyngeal residue - pyriform;Reduced pharyngeal peristalsis Pharyngeal -- Pharyngeal- Thin Straw Delayed swallow initiation-pyriform sinuses;Reduced anterior laryngeal mobility;Reduced laryngeal elevation;Reduced airway/laryngeal closure;Reduced pharyngeal peristalsis;Reduced tongue base retraction;Pharyngeal residue - valleculae;Pharyngeal residue - pyriform;Penetration/Aspiration during swallow Pharyngeal -- Pharyngeal- Puree Delayed swallow initiation-vallecula;Reduced laryngeal  elevation;Reduced airway/laryngeal closure;Reduced tongue base retraction;Pharyngeal residue - valleculae Pharyngeal -- Pharyngeal- Mechanical Soft Delayed swallow initiation-vallecula;Reduced laryngeal elevation;Reduced airway/laryngeal closure;Reduced tongue base retraction Pharyngeal -- Pharyngeal- Regular NT Pharyngeal -- Pharyngeal- Multi-consistency -- Pharyngeal -- Pharyngeal-  Pill Delayed swallow initiation-vallecula;Reduced laryngeal elevation;Reduced airway/laryngeal closure Pharyngeal -- Pharyngeal Comment --  CHL IP CERVICAL ESOPHAGEAL PHASE 09/15/2018 Cervical Esophageal Phase WFL Pudding Teaspoon -- Pudding Cup -- Honey Teaspoon -- Honey Cup -- Nectar Teaspoon -- Nectar Cup -- Nectar Straw -- Thin Teaspoon -- Thin Cup -- Thin Straw -- Puree -- Mechanical Soft -- Regular -- Multi-consistency -- Pill -- Cervical Esophageal Comment -- Charlynne Cousins Ward, MA, CCC-SLP 09/15/2018 2:59 PM              Dg Swallowing Func-speech Pathology  Result Date: 09/12/2018 Objective Swallowing Evaluation: Type of Study: MBS-Modified Barium Swallow Study  Patient Details Name: ROSANA FARNELL MRN: [id number] Date of Birth: November 13, 1945 Today's Date: 09/12/2018 Time: SLP Start Time (ACUTE ONLY): 1300 -SLP Stop Time (ACUTE ONLY): 1337 SLP Time Calculation (min) (ACUTE ONLY): 37 min Past Medical History: Past Medical History: Diagnosis Date . Arthritis  . Hiatal hernia  . History of chicken pox  . Hyperlipidemia  . Pacemaker  Past Surgical History: Past Surgical History: Procedure Laterality Date . ANKLE SURGERY   . PACEMAKER IMPLANT N/A 04/18/2018  Procedure: PACEMAKER IMPLANT;  Surgeon: Evans Lance, MD;  Location: [facility];  Service: Cardiovascular;  Laterality: N/A; HPI: RAJA CAPUTI is a 72 y.o. 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  During her hospitalization  she underwent left axillary lymph node biopsy.  She was discharged home in a stable condition, but apparently she has been developing worsening weakness, generalized, worse with exertion, no improving factors, associated with malaise, chills and poor appetite.  Over the last 24 hours her symptoms have been more severe to the point where she has difficulty standing. She has persistent left upper extremity edema which has been painful, sharp in nature up to 10 out of 10 intensity.  Her weakness has led to dyspnea which has been severe in intensity, and triggered her emergency room visit today.Pt was made NPO yesterday due to concerns for aspiration.   Today pt is alert, has congested weak cough concerrning for airway protection.  Subjective: The patient was seen sitting upright in bed with her daughter at the bedside.  Assessment / Plan / Recommendation CHL IP CLINICAL IMPRESSIONS 09/12/2018 Clinical Impression Pt presents with moderately severe oropharyngeal dysphagia with sensorimotor deficits.  Decreased oral propulsion and weak pharyngeal motility results in significant pharyngeal more than oral residuals.  Pt does NOT sense residuals and is unable to "hock" to expectorate to clear them.  Chin tuck, head turn postures did NOT decrease residuals.  Pt did aspirate with thin liquids posterior trachea with delayed cough response.  Although she did not aspirate with thicker consistencies, increased residuals present which if aspiration post-swallow are more difficult for pulmonary clearance.  Pt is grossly weak at this time - uncertain of her baseline swallow function.  Pt is a high aspiration/malnutrition risk due to level of dysphagia and weak nonproductive cough.  If she is to consume po intake= it should be with accepted risks and mitigation strategies.  Question if pt's swallow with improve with improved medical status. SLP Visit Diagnosis Dysphagia, oropharyngeal phase (R13.12) Attention and concentration deficit  following -- Frontal lobe and executive function deficit following -- Impact on safety and function Severe aspiration risk;Risk for inadequate nutrition/hydration   CHL IP TREATMENT RECOMMENDATION 09/11/2018 Treatment Recommendations Therapy as outlined in treatment plan below  Prognosis 09/12/2018 Prognosis for Safe Diet Advancement Fair Barriers to Reach Goals Severity of deficits;Cognitive deficits;Other (Comment) Barriers/Prognosis Comment -- CHL IP DIET RECOMMENDATION 09/12/2018 SLP Diet Recommendations (No Data) Liquid Administration via Cup Medication Administration Crushed with puree Compensations Slow rate;Small sips/bites;Follow solids with liquid Postural Changes Remain semi-upright after after feeds/meals (Comment);Seated upright at 90 degrees   CHL IP OTHER RECOMMENDATIONS 09/12/2018 Recommended Consults -- Oral Care Recommendations Oral care before and after PO Other Recommendations Have oral suction available   CHL IP FOLLOW UP RECOMMENDATIONS 09/12/2018 Follow up Recommendations (No Data)   CHL IP FREQUENCY AND DURATION 09/12/2018 Speech Therapy Frequency (ACUTE ONLY) min 2x/week Treatment Duration 1 week      CHL IP ORAL PHASE 09/12/2018 Oral Phase Impaired Oral - Pudding Teaspoon -- Oral - Pudding Cup -- Oral - Honey Teaspoon -- Oral - Honey Cup -- Oral - Nectar Teaspoon Weak lingual manipulation;Reduced posterior propulsion;Lingual/palatal residue Oral - Nectar Cup Weak lingual manipulation;Reduced posterior propulsion;Lingual/palatal residue Oral - Nectar Straw Weak lingual manipulation;Reduced posterior propulsion;Lingual/palatal residue Oral - Thin Teaspoon Weak lingual manipulation;Reduced posterior propulsion Oral - Thin Cup Weak lingual manipulation;Reduced posterior propulsion Oral - Thin Straw Reduced posterior propulsion Oral - Puree Weak lingual manipulation;Reduced posterior propulsion Oral - Mech Soft Weak lingual manipulation;Reduced posterior propulsion Oral - Regular -- Oral -  Multi-Consistency -- Oral - Pill -- Oral Phase - Comment --  CHL IP PHARYNGEAL PHASE 09/12/2018 Pharyngeal Phase Impaired Pharyngeal- Pudding Teaspoon -- Pharyngeal -- Pharyngeal- Pudding Cup -- Pharyngeal -- Pharyngeal- Honey Teaspoon -- Pharyngeal -- Pharyngeal- Honey Cup -- Pharyngeal -- Pharyngeal- Nectar Teaspoon Reduced pharyngeal peristalsis;Reduced epiglottic inversion;Reduced tongue base retraction;Pharyngeal residue - valleculae;Pharyngeal residue - pyriform Pharyngeal -- Pharyngeal- Nectar Cup Reduced pharyngeal peristalsis;Reduced epiglottic inversion;Reduced laryngeal elevation;Reduced airway/laryngeal closure;Reduced tongue base retraction;Pharyngeal residue - valleculae;Pharyngeal residue - pyriform;Penetration/Aspiration during swallow Pharyngeal Material enters airway, remains ABOVE vocal cords and not ejected out Pharyngeal- Nectar Straw Reduced pharyngeal peristalsis;Reduced epiglottic inversion;Reduced anterior laryngeal mobility;Reduced airway/laryngeal closure;Reduced tongue base retraction;Reduced laryngeal elevation;Pharyngeal residue - valleculae;Pharyngeal residue - pyriform Pharyngeal -- Pharyngeal- Thin Teaspoon Reduced pharyngeal peristalsis;Reduced epiglottic inversion;Reduced laryngeal elevation;Reduced airway/laryngeal closure;Reduced tongue base retraction;Pharyngeal residue - valleculae;Pharyngeal residue - pyriform Pharyngeal -- Pharyngeal- Thin Cup Reduced pharyngeal peristalsis;Reduced epiglottic inversion;Reduced laryngeal elevation;Reduced airway/laryngeal closure;Reduced tongue base retraction;Pharyngeal residue - valleculae;Pharyngeal residue - pyriform;Penetration/Aspiration during swallow Pharyngeal Material enters airway, passes BELOW cords without attempt by patient to eject out (silent aspiration) Pharyngeal- Thin Straw Reduced pharyngeal peristalsis;Reduced epiglottic inversion;Reduced laryngeal elevation;Reduced airway/laryngeal closure;Reduced tongue base  retraction;Pharyngeal residue - valleculae;Pharyngeal residue - pyriform Pharyngeal Material enters airway, passes BELOW cords and not ejected out despite cough attempt by patient Pharyngeal- Puree Reduced epiglottic inversion;Reduced pharyngeal peristalsis;Reduced tongue base retraction;Reduced airway/laryngeal closure;Reduced laryngeal elevation;Pharyngeal residue - valleculae;Pharyngeal residue - pyriform Pharyngeal -- Pharyngeal- Mechanical Soft Reduced pharyngeal peristalsis;Reduced epiglottic inversion;Reduced tongue base retraction;Pharyngeal residue - valleculae;Reduced airway/laryngeal closure Pharyngeal -- Pharyngeal- Regular -- Pharyngeal -- Pharyngeal- Multi-consistency -- Pharyngeal -- Pharyngeal- Pill -- Pharyngeal -- Pharyngeal Comment --  CHL IP CERVICAL ESOPHAGEAL PHASE 09/12/2018 Cervical Esophageal Phase Impaired Pudding Teaspoon -- Pudding Cup -- Honey Teaspoon -- Honey Cup -- Nectar Teaspoon -- Nectar Cup -- Nectar Straw -- Thin Teaspoon -- Thin Cup -- Thin Straw -- Puree -- Mechanical Soft -- Regular -- Multi-consistency -- Pill -- Cervical Esophageal Comment upon esophageal sweep, pt appeared clear Luanna Salk, MS [facility] SLP Acute Rehab Services Pager [phone] Office [phone] Macario Golds 09/12/2018, 5:29 PM  [place] Core Biopsy (lymph Nodes)  Result Date: 08/30/2018 INDICATION: 72 year old with scattered lung lesions, left arm swelling and extensive left axillary lymphadenopathy. Findings are concerning for metastatic disease and tissue diagnosis is needed. EXAM: ULTRASOUND-GUIDED CORE BIOPSY OF LEFT AXILLARY LYMPH NODE MEDICATIONS: None. ANESTHESIA/SEDATION: Moderate (conscious) sedation was employed during this procedure. A total of Versed 0.5 mg and Fentanyl 50 mcg was administered intravenously. Moderate Sedation Time: 15 minutes. The patient's level of consciousness and vital signs were monitored continuously by radiology nursing throughout the procedure under my  direct supervision. FLUOROSCOPY TIME:  None COMPLICATIONS: None immediate. PROCEDURE: Informed written consent was obtained from the patient after a thorough discussion of the procedural risks, benefits and alternatives. All questions were addressed. A timeout was performed prior to the initiation of the procedure. Ultrasound demonstrated enlarged left axillary lymph nodes. The left axilla was prepped and draped in sterile fashion. Skin was anesthetized with 1% lidocaine. 18 gauge core biopsies were obtained of a round enlarged lymph node with ultrasound guidance. Total of 6 core biopsies were obtained. Specimens placed in saline. Bandage placed over the puncture site. FINDINGS: Multiple enlarged hypoechoic lymph nodes in left axilla. Biopsy needle confirmed within the targeted lymph node. No bleeding or hematoma formation at end of procedure. IMPRESSION: Ultrasound-guided core biopsy of an enlarged left axillary lymph node. Electronically Signed   By: Markus Daft M.D.   On: 08/30/2018 18:08   Ir Picc Placement Right >5 Yrs Inc Img Guide  Result Date: 09/09/2018 INDICATION: Acute lymphoma, access for chemotherapy as an inpatient EXAM: ULTRASOUND AND FLUOROSCOPIC GUIDED PICC LINE INSERTION MEDICATIONS: 1% lidocaine local CONTRAST:  None FLUOROSCOPY TIME:  Twenty-four seconds (8 mGy) COMPLICATIONS: None immediate. TECHNIQUE: The procedure, risks, benefits, and alternatives were explained to the patient and informed written consent was obtained. A timeout was performed prior to the initiation of the procedure. The right upper extremity was prepped with chlorhexidine in a sterile fashion, and a sterile drape was applied covering the operative field. Maximum barrier sterile technique with sterile gowns and gloves were used for the procedure. A timeout was performed prior to the initiation of the procedure. Local anesthesia was provided with 1% lidocaine. Under direct ultrasound guidance, the right brachial vein was  accessed with a micropuncture kit after the overlying soft tissues were anesthetized with 1% lidocaine. An ultrasound image was saved for documentation purposes. A guidewire was advanced to the level of the superior caval-atrial junction for measurement purposes and the PICC line was cut to length. A peel-away sheath was placed and a 36 cm, 5 [place], dual lumen was inserted to level of the superior caval-atrial junction. A post procedure spot fluoroscopic was obtained. The catheter easily aspirated and flushed and was sutured in place. A dressing was placed. The patient tolerated the procedure well without immediate post procedural complication. FINDINGS: After catheter placement, the tip lies within the superior cavoatrial junction. The catheter aspirates and flushes normally and is ready for immediate use. IMPRESSION: Successful ultrasound and fluoroscopic guided placement of a right brachial vein approach, 36 cm, 5 French, dual lumen PICC with tip at the superior caval-atrial junction. The PICC line is ready for immediate use. Electronically Signed   By: Jerilynn Mages.  Shick M.D.   On: 09/09/2018 09:23   [place] Ekg Site Rite  Result Date: 09/08/2018 If Site Rite image not attached, placement could not be confirmed due to current cardiac rhythm.    CBC Recent Labs  Lab 09/22/18 0546 09/23/18 0950 09/24/18 9758 09/25/18 8325  09/26/18 0511 09/28/18 0500  WBC 2.2* 12.7* 12.9* 9.5 10.0 12.4*  HGB 7.0* 10.5* 11.0* 10.7* 10.9* 10.6*  HCT 22.1* 31.9* 33.9* 33.4* 34.3* 33.8*  PLT 129* 239 354 347 272 502*  MCV 89.5 87.6 88.1 87.4 88.2 90.9  MCH 28.3 28.8 28.6 28.0 28.0 28.5  MCHC 31.7 32.9 32.4 32.0 31.8 31.4  RDW 15.9* 15.0 15.7* 15.9* 16.5* 17.2*  LYMPHSABS 0.0* 0.1* 0.3* 0.1*  --   --   MONOABS 0.5 0.9 1.3* 1.0  --   --   EOSABS 0.0 0.0 0.0 0.0  --   --   BASOSABS 0.0 0.1 0.0 0.0  --   --     Chemistries  Recent Labs  Lab 09/22/18 0546 09/25/18 0523 09/26/18 0511 09/28/18 0500  NA 137 137 135 138   K 3.1* 3.2* 3.6 4.3  CL 106 103 103 106  CO2 _0 GLUCOSE 89 80 99 100*  BUN _1 CREATININE 0.41* 0.41* 0.33* 0.38*  CALCIUM 7.9* 7.8* 7.5* 8.0*  MG  --   --  1.9  --   AST 9* 17  --   --   ALT 14 18  --   --   ALKPHOS 57 84  --   --   BILITOT 1.9* 0.6  --   --     Lab Results  Component Value Date   HGBA1C 5.3 09/13/2012    Berle Mull M.D on 09/28/2018 at 3:45 PM  Pager---[phone] Go to [email or web address] - password [email or web address] for contact info  [facility] - Office  [phone]

## 2018-09-28 NOTE — Progress Notes (Signed)
Patricia Murillo   DOB:Sep 18, 1946   WU#:[id number]   [phone]  Hem/Onc follow up note   Subjective: Pt still has cough, with clear mucus production.  No fever, vital signs stable.  She is finishing the course of Levaquin today.  She still spends most of her time in the bed, but overall has improved.   Objective:  Vitals:   09/28/18 1019 09/28/18 1252  BP:  (!) 140/57  Pulse:  86  Resp:  15  Temp:  98.2 F (36.8 C)  SpO2: 95% 93%    Body mass index is 39.22 kg/m.  Intake/Output Summary (Last 24 hours) at 09/28/2018 1353 Last data filed at 09/28/2018 1019 Gross per 24 hour  Intake 840 ml  Output -  Net 840 ml     Sclerae unicteric  Oropharynx clear  (+) bulky adenopathy at left axilla and left upper front chest have much improved   Lungs clear -- scatter wheezing and rhonchi through both lungs   Heart regular rate and rhythm  Abdomen benign, soft   MSK no focal spinal tenderness, (+) severe edema of the left upper arm, no edema in legs   Neuro: alert, and follow commands. Pupil right bigger than left, both reactive, mild (+) ptosis of left eye.   CBG (last 3)  No results for input(s): GLUCAP in the last 72 hours.   Labs:  CBC Latest Ref Rng & Units 09/28/2018 09/26/2018 09/25/2018  WBC 4.0 - 10.5 K/uL 12.4(H) 10.0 9.5  Hemoglobin 12.0 - 15.0 g/dL 10.6(L) 10.9(L) 10.7(L)  Hematocrit 36.0 - 46.0 % 33.8(L) 34.3(L) 33.4(L)  Platelets 150 - 400 K/uL 502(H) 272 347    CMP Latest Ref Rng & Units 09/28/2018 09/26/2018 09/25/2018  Glucose 70 - 99 mg/dL 100(H) 99 80  BUN 8 - 23 mg/dL 10 11 10   Creatinine 0.44 - 1.00 mg/dL 0.38(L) 0.33(L) 0.41(L)  Sodium 135 - 145 mmol/L 138 135 137  Potassium 3.5 - 5.1 mmol/L 4.3 3.6 3.2(L)  Chloride 98 - 111 mmol/L 106 103 103  CO2 22 - 32 mmol/L 24 24 24   Calcium 8.9 - 10.3 mg/dL 8.0(L) 7.5(L) 7.8(L)  Total Protein 6.5 - 8.1 g/dL - - 4.5(L)  Total Bilirubin 0.3 - 1.2 mg/dL - - 0.6  Alkaline Phos 38 - 126 U/L - - 84  AST 15 - 41  U/L - - 17  ALT 0 - 44 U/L - - 18     Urine Studies No results for input(s): UHGB, CRYS in the last 72 hours.  Invalid input(s): UACOL, UAPR, USPG, UPH, UTP, UGL, UKET, UBIL, UNIT, UROB, ULEU, UEPI, UWBC, URBC, UBAC, CAST, [place], [place]  Basic Metabolic Panel: Recent Labs  Lab 09/22/18 0546 09/25/18 0523 09/26/18 0511 09/28/18 0500  NA 137 137 135 138  K 3.1* 3.2* 3.6 4.3  CL 106 103 103 106  CO2 27 24 24 24   GLUCOSE 89 80 99 100*  BUN 16 10 11 10   CREATININE 0.41* 0.41* 0.33* 0.38*  CALCIUM 7.9* 7.8* 7.5* 8.0*  MG  --   --  1.9  --    GFR Estimated Creatinine Clearance: 81.2 mL/min (A) (by C-G formula based on SCr of 0.38 mg/dL (L)). Liver Function Tests: Recent Labs  Lab 09/22/18 0546 09/25/18 0523  AST 9* 17  ALT 14 18  ALKPHOS 57 84  BILITOT 1.9* 0.6  PROT 4.6* 4.5*  ALBUMIN 2.4* 2.4*   No results for input(s): LIPASE, AMYLASE in the last 168 hours. No results for  input(s): AMMONIA in the last 168 hours. Coagulation profile No results for input(s): INR, PROTIME in the last 168 hours.  CBC: Recent Labs  Lab 09/22/18 0546 09/23/18 0950 09/24/18 0648 09/25/18 0523 09/26/18 0511 09/28/18 0500  WBC 2.2* 12.7* 12.9* 9.5 10.0 12.4*  NEUTROABS 1.6* 11.0* 10.2* 7.3  --   --   HGB 7.0* 10.5* 11.0* 10.7* 10.9* 10.6*  HCT 22.1* 31.9* 33.9* 33.4* 34.3* 33.8*  MCV 89.5 87.6 88.1 87.4 88.2 90.9  PLT 129* 239 354 347 272 502*   Cardiac Enzymes: No results for input(s): CKTOTAL, CKMB, CKMBINDEX, TROPONINI in the last 168 hours. BNP: Invalid input(s): POCBNP CBG: No results for input(s): GLUCAP in the last 168 hours. D-Dimer No results for input(s): DDIMER in the last 72 hours. Hgb A1c No results for input(s): HGBA1C in the last 72 hours. Lipid Profile No results for input(s): CHOL, HDL, LDLCALC, TRIG, CHOLHDL, LDLDIRECT in the last 72 hours. Thyroid function studies No results for input(s): TSH, T4TOTAL, T3FREE, THYROIDAB in the last 72 hours.  Invalid  input(s): FREET3 Anemia work up No results for input(s): VITAMINB12, FOLATE, FERRITIN, TIBC, IRON, RETICCTPCT in the last 72 hours. Microbiology Recent Results (from the past 240 hour(s))  Culture, blood (routine x 2)     Status: Abnormal   Collection Time: 09/21/18  8:13 AM  Result Value Ref Range Status   Specimen Description   Final    BLOOD RIGHT ANTECUBITAL Performed at [facility], [place] [address], [place], [place] [address]    Special Requests   Final    BOTTLES DRAWN AEROBIC AND ANAEROBIC Blood Culture adequate volume Performed at [facility] [address], [place], [place] [address]    Culture  Setup Time   Final    GRAM NEGATIVE RODS AEROBIC BOTTLE ONLY CRITICAL RESULT CALLED TO, READ BACK BY AND VERIFIED WITHLavell Luster [facility] 8502 09/22/18 A BROWNING Performed at [facility], [place] [address] [place], [place] [address]    Culture PSEUDOMONAS AERUGINOSA (A)  Final   Report Status 09/24/2018 FINAL  Final   Organism ID, Bacteria PSEUDOMONAS AERUGINOSA  Final      Susceptibility   Pseudomonas aeruginosa - MIC*    CEFTAZIDIME 4 SENSITIVE Sensitive     CIPROFLOXACIN <=0.25 SENSITIVE Sensitive     GENTAMICIN <=1 SENSITIVE Sensitive     IMIPENEM 2 SENSITIVE Sensitive     PIP/TAZO <=4 SENSITIVE Sensitive     CEFEPIME 4 SENSITIVE Sensitive     * PSEUDOMONAS AERUGINOSA  Blood Culture ID Panel (Reflexed)     Status: Abnormal   Collection Time: 09/21/18  8:13 AM  Result Value Ref Range Status   Enterococcus species NOT DETECTED NOT DETECTED Final   Listeria monocytogenes NOT DETECTED NOT DETECTED Final   Staphylococcus species NOT DETECTED NOT DETECTED Final   Staphylococcus aureus (BCID) NOT DETECTED NOT DETECTED Final   Streptococcus species NOT DETECTED NOT DETECTED Final   Streptococcus agalactiae NOT DETECTED NOT DETECTED Final   Streptococcus pneumoniae NOT DETECTED NOT DETECTED Final   Streptococcus pyogenes NOT DETECTED NOT  DETECTED Final   Acinetobacter baumannii NOT DETECTED NOT DETECTED Final   Enterobacteriaceae species NOT DETECTED NOT DETECTED Final   Enterobacter cloacae complex NOT DETECTED NOT DETECTED Final   Escherichia coli NOT DETECTED NOT DETECTED Final   Klebsiella oxytoca NOT DETECTED NOT DETECTED Final   Klebsiella pneumoniae NOT DETECTED NOT DETECTED Final   Proteus species NOT DETECTED NOT DETECTED Final  Serratia marcescens NOT DETECTED NOT DETECTED Final   Carbapenem resistance NOT DETECTED NOT DETECTED Final   Haemophilus influenzae NOT DETECTED NOT DETECTED Final   Neisseria meningitidis NOT DETECTED NOT DETECTED Final   Pseudomonas aeruginosa DETECTED (A) NOT DETECTED Final    Comment: CRITICAL RESULT CALLED TO, READ BACK BY AND VERIFIED WITHLavell Luster [facility] 1324 09/22/18 A BROWNING    Candida albicans NOT DETECTED NOT DETECTED Final   Candida glabrata NOT DETECTED NOT DETECTED Final   Candida krusei NOT DETECTED NOT DETECTED Final   Candida parapsilosis NOT DETECTED NOT DETECTED Final   Candida tropicalis NOT DETECTED NOT DETECTED Final    Comment: Performed at [facility], [address] [place], [place] [address]  Culture, blood (routine x 2)     Status: None   Collection Time: 09/21/18  8:56 AM  Result Value Ref Range Status   Specimen Description   Final    BLOOD RIGHT HAND Performed at [facility] [address] [place], [place] [address]    Special Requests   Final    BOTTLES DRAWN AEROBIC AND ANAEROBIC Blood Culture results may not be optimal due to an inadequate volume of blood received in culture bottles Performed at [facility] [address], [place], [place] [address]    Culture   Final    NO GROWTH 5 DAYS Performed at [facility], [place] [address] [place], [place] [address]    Report Status 09/26/2018 FINAL  Final  Urine Culture     Status: Abnormal   Collection Time: 09/21/18  3:43 PM  Result Value  Ref Range Status   Specimen Description   Final    URINE, CLEAN CATCH Performed at [facility], [place] [address] [place], [place] [address]    Special Requests   Final    Immunocompromised Performed at [facility], [place] [address], [place], [place] [address]    Culture (A)  Final    20,000 COLONIES/mL PSEUDOMONAS AERUGINOSA 20,000 COLONIES/mL ENTEROCOCCUS FAECALIS    Report Status 09/24/2018 FINAL  Final   Organism ID, Bacteria PSEUDOMONAS AERUGINOSA (A)  Final   Organism ID, Bacteria ENTEROCOCCUS FAECALIS (A)  Final      Susceptibility   Enterococcus faecalis - MIC*    AMPICILLIN <=2 SENSITIVE Sensitive     LEVOFLOXACIN 1 SENSITIVE Sensitive     NITROFURANTOIN <=16 SENSITIVE Sensitive     VANCOMYCIN 1 SENSITIVE Sensitive     * 20,000 COLONIES/mL ENTEROCOCCUS FAECALIS   Pseudomonas aeruginosa - MIC*    CEFTAZIDIME 2 SENSITIVE Sensitive     CIPROFLOXACIN <=0.25 SENSITIVE Sensitive     GENTAMICIN <=1 SENSITIVE Sensitive     IMIPENEM 2 SENSITIVE Sensitive     PIP/TAZO 8 SENSITIVE Sensitive     CEFEPIME 2 SENSITIVE Sensitive     * 20,000 COLONIES/mL PSEUDOMONAS AERUGINOSA  Culture, blood (Routine X 2) w Reflex to ID Panel     Status: None   Collection Time: 09/23/18  6:17 PM  Result Value Ref Range Status   Specimen Description BLOOD RIGHT ARM  Final   Special Requests   Final    BOTTLES DRAWN AEROBIC AND ANAEROBIC Blood Culture adequate volume Performed at [facility], [place] [address], [place], [place] [address]    Culture NO GROWTH 5 DAYS  Final   Report Status 09/28/2018 FINAL  Final      Studies:  No results found.  Assessment:  72 y.o. with newly diagnosed high-grade B-cell lymphoma, recently discharged from hospital after biopsy, presented to the emergency room with progressive weakness, dyspnea, chills and fever, anorexia, and extremity edema.  1.  Pseudomonas UTI and bacteremia, resolved, repeated blood  cultures (-)  2.  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 Hypoxic respite failure, improved  4.  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 Hypercalcemia secondary to malignancy, s/p zometa 12/5, resolved now  7. Severe arm and leg edema, secondary to severe adenopathy and hypoalbuminemia, improving  8. Deconditioning  9 left ptosis and dilated right pupil  10.  Sacral pressure ulcers   Plan:  -she is on Levaquin now, plan to complete today, she will be discharged to SNF, bed is still pending  -she is due for cycle 2 chemo R-CHOP this Friday. If she remains to be in the hospital, I will start Rituxan tomorrow, and accompanied chemo CHOP on Friday morning. -I may consider prophylactic antibiotics after chemo due to her high risk of infection -continue prednisone 20mg  daily, plan to change to 60mg  daily for 5 days due to the chemo CHOP, then change to 10mg  daily and taper it off in 2 week  -I will f/u tomorrow    Truitt Merle  09/28/2018

## 2018-09-29 LAB — COMPREHENSIVE METABOLIC PANEL
ALT: 21 U/L (ref 0–44)
AST: 24 U/L (ref 15–41)
Albumin: 2.7 g/dL — ABNORMAL LOW (ref 3.5–5.0)
Alkaline Phosphatase: 72 U/L (ref 38–126)
Anion gap: 8 (ref 5–15)
BUN: 8 mg/dL (ref 8–23)
CALCIUM: 7.8 mg/dL — AB (ref 8.9–10.3)
CO2: 23 mmol/L (ref 22–32)
Chloride: 108 mmol/L (ref 98–111)
Creatinine, Ser: 0.43 mg/dL — ABNORMAL LOW (ref 0.44–1.00)
GFR calc Af Amer: >60 mL/min (ref >60)
GFR calc non Af Amer: >60 mL/min (ref >60)
GLUCOSE: 127 mg/dL — AB (ref 70–99)
Potassium: 3.4 mmol/L — ABNORMAL LOW (ref 3.5–5.1)
Sodium: 139 mmol/L (ref 135–145)
Total Bilirubin: 0.6 mg/dL (ref 0.3–1.2)
Total Protein: 5.3 g/dL — ABNORMAL LOW (ref 6.5–8.1)

## 2018-09-29 LAB — CBC WITH DIFFERENTIAL/PLATELET
Abs Immature Granulocytes: 2.63 10*3/uL — ABNORMAL HIGH (ref 0.00–0.07)
Basophils Absolute: 0 10*3/uL (ref 0.0–0.1)
Basophils Relative: 0 %
Eosinophils Absolute: 0 10*3/uL (ref 0.0–0.5)
Eosinophils Relative: 0 %
HEMATOCRIT: 32.4 % — AB (ref 36.0–46.0)
HEMOGLOBIN: 10.1 g/dL — AB (ref 12.0–15.0)
Immature Granulocytes: 25 %
Lymphocytes Relative: 2 %
Lymphs Abs: 0.2 10*3/uL — ABNORMAL LOW (ref 0.7–4.0)
MCH: 28.1 pg (ref 26.0–34.0)
MCHC: 31.2 g/dL (ref 30.0–36.0)
MCV: 90.3 fL (ref 80.0–100.0)
MONOS PCT: 5 %
Monocytes Absolute: 0.5 10*3/uL (ref 0.1–1.0)
Neutro Abs: 7.2 10*3/uL (ref 1.7–7.7)
Neutrophils Relative %: 68 %
Platelets: 399 10*3/uL (ref 150–400)
RBC: 3.59 MIL/uL — ABNORMAL LOW (ref 3.87–5.11)
RDW: 17.1 % — ABNORMAL HIGH (ref 11.5–15.5)
WBC: 10.6 10*3/uL — ABNORMAL HIGH (ref 4.0–10.5)
nRBC: 0 % (ref 0.0–0.2)

## 2018-09-29 LAB — LACTATE DEHYDROGENASE: LDH: 245 U/L — ABNORMAL HIGH (ref 98–192)

## 2018-09-29 MED ORDER — ACETAMINOPHEN 325 MG PO TABS
650.0000 mg | ORAL_TABLET | Freq: Once | ORAL | Status: AC
  Administered 2018-09-29: 650 mg via ORAL
  Filled 2018-09-29: qty 2

## 2018-09-29 MED ORDER — SODIUM CHLORIDE 0.9 % IV SOLN
750.0000 mg/m2 | Freq: Once | INTRAVENOUS | Status: AC
  Administered 2018-09-29: 1540 mg via INTRAVENOUS
  Filled 2018-09-29: qty 77

## 2018-09-29 MED ORDER — ENOXAPARIN SODIUM 40 MG/0.4ML ~~LOC~~ SOLN
40.0000 mg | SUBCUTANEOUS | Status: DC
  Administered 2018-09-30 – 2018-10-05 (×6): 40 mg via SUBCUTANEOUS
  Filled 2018-09-29 (×6): qty 0.4

## 2018-09-29 MED ORDER — PREDNISONE 20 MG PO TABS
60.0000 mg | ORAL_TABLET | Freq: Every day | ORAL | Status: DC
  Administered 2018-09-30 – 2018-10-05 (×6): 60 mg via ORAL
  Filled 2018-09-29 (×6): qty 3

## 2018-09-29 MED ORDER — POLYETHYLENE GLYCOL 3350 17 G PO PACK: 17.0000 g | PACK | Freq: Every day | ORAL | Status: DC | PRN

## 2018-09-29 MED ORDER — PALONOSETRON HCL INJECTION 0.25 MG/5ML
0.2500 mg | Freq: Once | INTRAVENOUS | Status: AC
  Administered 2018-09-29: 0.25 mg via INTRAVENOUS
  Filled 2018-09-29: qty 5

## 2018-09-29 MED ORDER — SENNOSIDES-DOCUSATE SODIUM 8.6-50 MG PO TABS: 1.0000 | ORAL_TABLET | Freq: Every evening | ORAL | Status: DC | PRN

## 2018-09-29 MED ORDER — PREDNISONE 20 MG PO TABS
40.0000 mg | ORAL_TABLET | Freq: Once | ORAL | Status: AC
  Administered 2018-09-29: 40 mg via ORAL
  Filled 2018-09-29: qty 2

## 2018-09-29 MED ORDER — VINCRISTINE SULFATE CHEMO INJECTION 1 MG/ML
2.0000 mg | Freq: Once | INTRAVENOUS | Status: AC
  Administered 2018-09-29: 2 mg via INTRAVENOUS
  Filled 2018-09-29: qty 2

## 2018-09-29 MED ORDER — SODIUM CHLORIDE 0.9 % IV SOLN
Freq: Once | INTRAVENOUS | Status: AC
  Administered 2018-09-29: 17:00:00 via INTRAVENOUS
  Filled 2018-09-29: qty 5

## 2018-09-29 MED ORDER — DIPHENHYDRAMINE HCL 50 MG PO CAPS
50.0000 mg | ORAL_CAPSULE | Freq: Once | ORAL | Status: AC
  Administered 2018-09-29: 50 mg via ORAL
  Filled 2018-09-29: qty 1

## 2018-09-29 MED ORDER — SODIUM CHLORIDE 0.9 % IV SOLN
375.0000 mg/m2 | Freq: Once | INTRAVENOUS | Status: AC
  Administered 2018-09-29: 800 mg via INTRAVENOUS
  Filled 2018-09-29: qty 50

## 2018-09-29 MED ORDER — DOXORUBICIN HCL CHEMO IV INJECTION 2 MG/ML
50.0000 mg/m2 | Freq: Once | INTRAVENOUS | Status: AC
  Administered 2018-09-29: 102 mg via INTRAVENOUS
  Filled 2018-09-29: qty 51

## 2018-09-29 NOTE — Progress Notes (Signed)
Occupational Therapy Treatment Patient Details Name: Patricia Murillo MRN: [id number] DOB: 06-17-1946 Today's Date: 09/29/2018    History of present illness 72 y.o. 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  During her hospitalization she underwent left axillary lymph node biopsy. Pt admitted with LUE edema, weakness, dyspnea. 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    OT comments  Educated pt and daughter on importance of elevating LUE due to edema .  Pt not agreeable to wrapping LUE at this time - will re try next day   Follow Up Recommendations  SNF    Equipment Recommendations  None recommended by OT    Recommendations for Other Services      Precautions / Restrictions Precautions Precautions: Fall Precaution Comments: decreased LUE contol due to edema Restrictions Weight Bearing Restrictions: No       Mobility Bed Mobility Overal bed mobility: Needs Assistance Bed Mobility: Rolling;Sidelying to Sit Rolling: Mod assist   Supine to sit: Max assist Sit to supine: Max assist;+2 for physical assistance   General bed mobility comments: assist to raise trunk and to manage swollen LUE  Transfers Overall transfer level: Needs assistance   Transfers: Sit to/from Stand Sit to Stand: From elevated surface;+2 physical assistance;Total assist         General transfer comment: attempted sit to stand with stedy, pt unable to clear hips from bed, refused a 2nd attempt 2* fatigue/pain/dizziness    Balance Overall balance assessment: Needs assistance Sitting-balance support: Bilateral upper extremity supported;Feet supported Sitting balance-Leahy Scale: Fair                                     ADL either performed or assessed with clinical judgement   ADL Overall ADL's : Needs assistance/impaired                                        General ADL Comments: Order obtained for Lymphedema wrapping LUE.  Process explained , supplies obtained, initiated education with daughter then pt refused for OT to wrap her arm this day.  Pt agreeable to next day. Explained importance of keeping LUE elevated on pillows to decrease edema               Cognition Arousal/Alertness: Awake/alert Behavior During Therapy: WFL for tasks assessed/performed;Anxious;Agitated Overall Cognitive Status: Within Functional Limits for tasks assessed                                                     Pertinent Vitals/ Pain       Pain Score: 8  Faces Pain Scale: Hurts whole lot Pain Location: "back" at sacral wound site Pain Descriptors / Indicators: Sore Pain Intervention(s): Repositioned;Limited activity within patient's tolerance;Premedicated before session         Frequency  Min 2X/week        Progress Toward Goals  OT Goals(current goals can now be found in the care plan section)  Progress towards OT goals: OT to reassess next treatment     Plan  Discharge plan remains appropriate       AM-PAC OT "6 Clicks" Daily Activity     Outcome Measure   Help from another person eating meals?: A Lot Help from another person taking care of personal grooming?: A Lot Help from another person toileting, which includes using toliet, bedpan, or urinal?: Total Help from another person bathing (including washing, rinsing, drying)?: Total Help from another person to put on and taking off regular upper body clothing?: Total Help from another person to put on and taking off regular lower body clothing?: Total 6 Click Score: 8    End of Session Equipment Utilized During Treatment: Rolling walker  OT Visit Diagnosis: Unsteadiness on feet (R26.81);Muscle weakness (generalized) (M62.81)   Activity Tolerance Treatment limited secondary to agitation   Patient Left in bed    Nurse Communication Mobility status        Time: 4128-2081 OT Time Calculation (min): 11 min  Charges: OT General Charges $OT Visit: 1 Visit OT Treatments  $Therapeutic Activity: 8-22 mins  Kari Baars, [place] [phone] Office- [phone], Edwena Felty D 09/29/2018, 3:24 PM

## 2018-09-29 NOTE — Progress Notes (Signed)
Patient Demographics:    Patricia Murillo, is a 72 y.o. female, DOB - 1946/06/22, GBE:[id number]  Admit date - 09/07/2018   Admitting Physician Mauricio Gerome Apley, MD  Outpatient Primary MD for the patient is Patient, No Pcp Per  LOS - 24   Chief Complaint  Patient presents with  . Shortness of Breath        Subjective:   Reports that she has 2 loose BM.  And is concerned that she is going to have diarrhea.  Requesting to reduce stool softener.  No nausea no vomiting.  Cough better.   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 with newly diagnosed high-grade B-cell lymphoma, recently discharged from hospital after biopsy, presented to the emergency room with progressive weakness, dyspnea, chills and fever, anorexia, and extremity edema, found to have  Pseudomonas bacteremia, as well as enterococcus and Pseudomonas UTI, currently on IV Levaquin , awaiting insurance approval for  transfer to SNF rehab   PLan:-  1) Neutropenic Fevers/Pseudomonas Aeruginosa Bacteremia/Pseudomonas UTI--clinically much improved, currently afebrile, WBC  12.9, Granix discontinued ,  blood cultures from 09/21/2018 with pseudomonas aeruginosa, urine culture also with Pseudomonas aeruginosa AND enterococcus "faecalis",, chest x-ray without acute findings, treated with IV cefepime started 09/21/2018 thru 09/24/18, verbal consultation on 09/22/18 with Dr Talbot Grumbling and ID Pharmacist , verbal consultation with Dr Michel Bickers on 09/24/18, stopped  cefepime, start IV Levaquin 750 daily for 5 days starting on 09/24/2018 (Last dose 09/28/18) to cover for both Pseudomonas and enterococcus based on urine culture  2)Diffuse large B-cell lymphoma, activated B-cell type--- per Dr Burr Medico R-[facility] should be continued once acute issues resolve, next dose 09/30/18, continue prednisone  Getting second round of chemotherapy.  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 Burr Medico  Appreciated  5)Hypercalcemia secondary to malignancy- s/p Zometa  6)Acute hypoxic respiratory failure. Likely related to progressive high-grade B-cell lymphoma with pulmonary involvement comp located by suspected aspiration--- respiratory status appears back to baseline--- continue bronchodilators, IV Levaquin as above #1 (last dose 09/28/18)  7)Acute toxic and metabolic encephalopathy----mentation is back to baseline as per family, it was multi-factorial including malignancy, encephalopathy has now resolved , hypercalcemia and obviously infection,   8)Moderate protein caloric malnutrition----due to underlying malignancy and poor oral intake, patient has low albumin and edema--- nutritional supplements advised, c/n Megace for appetite stimulation  9)Significant left upper extremity swelling and weakness----due to lymphatic obstruction by high-grade lymphoma, venous Dopplers negative of the DVT on 08/29/2018 Lymphedema wrap.  10)unstageable pressure injury in the sacrococcygeal area- 3.5cm x 1.5cm x 0cm ---  please see wound care recommendations from ostomy RN dated 09/22/2018  Disposition/Need for in-Hospital Stay- patient unable to be discharged at this time due to Pseudomonas  bacteremia and UTI in an immunocompromised patient requiring IV antibiotics, also awaiting insurance approval for SNF rehab   Code Status : FULL    Disposition Plan  : SNF rehab  Consults  :  oncology   DVT Prophylaxis  :  Lovenox/TEDs - SCDs   Lab Results  Component Value Date   PLT 399 09/29/2018    Inpatient Medications  Scheduled Meds: . acyclovir  400 mg Oral BID  . allopurinol  300 mg Oral Daily  . amLODipine  5 mg Oral Daily  . cholecalciferol  2,000 Units Oral Daily  . collagenase   Topical Daily  . dextromethorphan  30 mg Oral BID  . [START ON 09/30/2018] enoxaparin (LOVENOX) injection  40 mg Subcutaneous Q24H  . famotidine  20 mg Oral Daily  . feeding supplement (ENSURE ENLIVE)  237 mL Oral BID BM  . fluticasone  2 spray Each Nare Daily  . folic acid  1 mg Oral Daily  . guaiFENesin  600 mg Oral BID  . lactobacillus acidophilus & bulgar  1 tablet Oral TID WC  . levalbuterol  0.63 mg Nebulization BID  . loratadine  10 mg Oral Daily  . mouth rinse  15 mL Mouth Rinse BID  . megestrol  400 mg Oral Daily  . mupirocin ointment   Topical BID  . pantoprazole  40 mg Oral BID  . [START ON 09/30/2018] predniSONE  60 mg Oral Q breakfast   Continuous Infusions: . sodium chloride Stopped (09/27/18 2114)  . sodium chloride Stopped (09/28/18 1533)   PRN Meds:.sodium chloride, sodium chloride, acetaminophen **OR** [DISCONTINUED] acetaminophen, alteplase, alum & mag hydroxide-simeth **AND** lidocaine, benzonatate, lidocaine, lip balm, meclizine, ondansetron **OR** ondansetron (ZOFRAN) IV, oxyCODONE, polyethylene glycol, senna-docusate, sodium chloride flush    Anti-infectives (From admission, onward)   Start     Dose/Rate Route Frequency Ordered Stop   09/24/18 1500  levofloxacin (LEVAQUIN) IVPB 750 mg     750 mg 100 mL/hr over 90 Minutes Intravenous Every 24 hours 09/24/18 1023 09/28/18 1703   09/21/18 1200  ceFEPIme (MAXIPIME) 2 g in sodium chloride 0.9 % 100 mL IVPB      2 g 200 mL/hr over 30 Minutes Intravenous Every 8 hours 09/21/18 1027 09/24/18 1230   09/12/18 1600  ampicillin-sulbactam (UNASYN) 1.5 g in sodium chloride 0.9 % 100 mL IVPB  Status:  Discontinued     1.5 g 200 mL/hr over 30 Minutes Intravenous Every 6 hours 09/12/18 1408 09/15/18 0753   09/10/18 1000  acyclovir (ZOVIRAX) 200 MG capsule 400 mg     400 mg Oral 2 times daily 09/10/18 0754     09/07/18 2000  cefTRIAXone (ROCEPHIN) 1 g in sodium chloride 0.9 % 100 mL IVPB  Status:  Discontinued     1 g 200 mL/hr over 30 Minutes Intravenous Every 24 hours 09/07/18 1844 09/12/18 0940   09/07/18 0830  ceFEPIme (MAXIPIME) 2 g in sodium chloride 0.9 % 100 mL IVPB     2 g 200 mL/hr over 30 Minutes Intravenous  Once 09/07/18 0820 09/07/18 0912   09/07/18 0830  metroNIDAZOLE (FLAGYL) IVPB 500 mg  Status:  Discontinued     500 mg 100 mL/hr over 60 Minutes Intravenous Every 8 hours 09/07/18 0820 09/08/18 1812   09/07/18 0830  vancomycin (VANCOCIN) IVPB 1000 mg/200 mL premix  1,000 mg 200 mL/hr over 60 Minutes Intravenous  Once 09/07/18 0820 09/07/18 1157        Objective:   Vitals:   09/29/18 0400 09/29/18 1338 09/29/18 1419 09/29/18 1456  BP: 112/66 (!) 156/72 (!) 148/79 (!) 150/80  Pulse: 82 (!) 112 (!) 111 89  Resp: _0 Temp: 97.9 F (36.6 C) 98.2 F (36.8 C) 99 F (37.2 C) 98.7 F (37.1 C)  TempSrc: Oral Oral Oral Oral  SpO2: 98% 97% 95% 97%  Weight:      Height:        Wt Readings from Last 3 Encounters:  09/27/18 110.2 kg  08/30/18 90.2 kg  08/04/18 83.5 kg     Intake/Output Summary (Last 24 hours) at 09/29/2018 1912 Last data filed at 09/29/2018 1200 Gross per 24 hour  Intake 240 ml  Output 5 ml  Net 235 ml     Physical Exam gen:- Awake,  Alert,  In no apparent distress  HEENT:- [place].AT, No sclera icterus Neck-Supple Neck,No JVD,.  Lungs-  CTAB , fair symmetrical air movement CV- S1, S2 normal, regular  Abd-  +ve B.Sounds, Abd Soft, No tenderness,     Extremity:- pedal pulses present, improving edema of left upper extremity, bilateral lower extremity edema improving slowly, right upper extremity PICC line site w/o evidence of inflammation or infection Psych-affect is appropriate, and is coherent, oriented x3 Neuro-generalized weakness but no new focal deficits, no tremors Skin-unstageable pressure injury in the sacrococcygeal area- 3.5cm x 1.5cm x 0cm --- please see photo below Media Information   Document Information   Photos    09/23/2018 12:28  Attached To:  Hospital Encounter on 09/07/18  Source Information   Roxan Hockey, MD  [facility] On[facility]     Data Review:   Micro Results Recent Results (from the past 240 hour(s))  Culture, blood (routine x 2)     Status: Abnormal   Collection Time: 09/21/18  8:13 AM  Result Value Ref Range Status   Specimen Description   Final    BLOOD RIGHT ANTECUBITAL Performed at [facility], [address] [place], [place] [address]    Special Requests   Final    BOTTLES DRAWN AEROBIC AND ANAEROBIC Blood Culture adequate volume Performed at [facility] [address] [place], [place] [address]    Culture  Setup Time   Final    GRAM NEGATIVE RODS AEROBIC BOTTLE ONLY CRITICAL RESULT CALLED TO, READ BACK BY AND VERIFIED WITHLavell Luster [facility] 8882 09/22/18 A BROWNING Performed at [facility], [place] [address], [place], [place] [address]    Culture PSEUDOMONAS AERUGINOSA (A)  Final   Report Status 09/24/2018 FINAL  Final   Organism ID, Bacteria PSEUDOMONAS AERUGINOSA  Final      Susceptibility   Pseudomonas aeruginosa - MIC*    CEFTAZIDIME 4 SENSITIVE Sensitive     CIPROFLOXACIN <=0.25 SENSITIVE Sensitive     GENTAMICIN <=1 SENSITIVE Sensitive     IMIPENEM 2 SENSITIVE Sensitive     PIP/TAZO <=4 SENSITIVE Sensitive     CEFEPIME 4 SENSITIVE Sensitive     * PSEUDOMONAS AERUGINOSA  Blood Culture ID Panel (Reflexed)     Status: Abnormal    Collection Time: 09/21/18  8:13 AM  Result Value Ref Range Status   Enterococcus species NOT DETECTED NOT DETECTED Final   Listeria monocytogenes NOT DETECTED NOT DETECTED Final   Staphylococcus species NOT DETECTED NOT DETECTED Final  Staphylococcus aureus (BCID) NOT DETECTED NOT DETECTED Final   Streptococcus species NOT DETECTED NOT DETECTED Final   Streptococcus agalactiae NOT DETECTED NOT DETECTED Final   Streptococcus pneumoniae NOT DETECTED NOT DETECTED Final   Streptococcus pyogenes NOT DETECTED NOT DETECTED Final   Acinetobacter baumannii NOT DETECTED NOT DETECTED Final   Enterobacteriaceae species NOT DETECTED NOT DETECTED Final   Enterobacter cloacae complex NOT DETECTED NOT DETECTED Final   Escherichia coli NOT DETECTED NOT DETECTED Final   Klebsiella oxytoca NOT DETECTED NOT DETECTED Final   Klebsiella pneumoniae NOT DETECTED NOT DETECTED Final   Proteus species NOT DETECTED NOT DETECTED Final   Serratia marcescens NOT DETECTED NOT DETECTED Final   Carbapenem resistance NOT DETECTED NOT DETECTED Final   Haemophilus influenzae NOT DETECTED NOT DETECTED Final   Neisseria meningitidis NOT DETECTED NOT DETECTED Final   Pseudomonas aeruginosa DETECTED (A) NOT DETECTED Final    Comment: CRITICAL RESULT CALLED TO, READ BACK BY AND VERIFIED WITHLavell Luster [facility] 1941 09/22/18 A BROWNING    Candida albicans NOT DETECTED NOT DETECTED Final   Candida glabrata NOT DETECTED NOT DETECTED Final   Candida krusei NOT DETECTED NOT DETECTED Final   Candida parapsilosis NOT DETECTED NOT DETECTED Final   Candida tropicalis NOT DETECTED NOT DETECTED Final    Comment: Performed at [facility], [place] [address], [place], [place] [address]  Culture, blood (routine x 2)     Status: None   Collection Time: 09/21/18  8:56 AM  Result Value Ref Range Status   Specimen Description   Final    BLOOD RIGHT HAND Performed at [facility] [address], [place], [place]  [address]    Special Requests   Final    BOTTLES DRAWN AEROBIC AND ANAEROBIC Blood Culture results may not be optimal due to an inadequate volume of blood received in culture bottles Performed at [facility] [address] [place], [place] [address]    Culture   Final    NO GROWTH 5 DAYS Performed at [facility], [place] [address], [place], [place] [address]    Report Status 09/26/2018 FINAL  Final  Urine Culture     Status: Abnormal   Collection Time: 09/21/18  3:43 PM  Result Value Ref Range Status   Specimen Description   Final    URINE, CLEAN CATCH Performed at [facility], [place] [address], [place], [place] [address]    Special Requests   Final    Immunocompromised Performed at [facility], [place] [address] [place], [place] [address]    Culture (A)  Final    20,000 COLONIES/mL PSEUDOMONAS AERUGINOSA 20,000 COLONIES/mL ENTEROCOCCUS FAECALIS    Report Status 09/24/2018 FINAL  Final   Organism ID, Bacteria PSEUDOMONAS AERUGINOSA (A)  Final   Organism ID, Bacteria ENTEROCOCCUS FAECALIS (A)  Final      Susceptibility   Enterococcus faecalis - MIC*    AMPICILLIN <=2 SENSITIVE Sensitive     LEVOFLOXACIN 1 SENSITIVE Sensitive     NITROFURANTOIN <=16 SENSITIVE Sensitive     VANCOMYCIN 1 SENSITIVE Sensitive     * 20,000 COLONIES/mL ENTEROCOCCUS FAECALIS   Pseudomonas aeruginosa - MIC*    CEFTAZIDIME 2 SENSITIVE Sensitive     CIPROFLOXACIN <=0.25 SENSITIVE Sensitive     GENTAMICIN <=1 SENSITIVE Sensitive     IMIPENEM 2 SENSITIVE Sensitive     PIP/TAZO 8 SENSITIVE Sensitive     CEFEPIME 2 SENSITIVE Sensitive     *  20,000 COLONIES/mL PSEUDOMONAS AERUGINOSA  Culture, blood (Routine X 2) w Reflex to ID Panel     Status: None   Collection Time: 09/23/18  6:17 PM  Result Value Ref Range Status   Specimen Description BLOOD RIGHT ARM  Final   Special Requests   Final    BOTTLES DRAWN AEROBIC AND ANAEROBIC Blood Culture  adequate volume Performed at [facility] [address] [place], [place] [address]    Culture NO GROWTH 5 DAYS  Final   Report Status 09/28/2018 FINAL  Final    Radiology Reports Dg Chest 2 View  Result Date: 09/17/2018 CLINICAL DATA:  Cough. EXAM: CHEST - 2 VIEW COMPARISON:  Radiograph of September 14, 2018. FINDINGS: Stable cardiomediastinal silhouette. Large hiatal hernia is noted. No pneumothorax is noted. Right-sided pacemaker is unchanged in position. Mild bilateral pleural effusions are noted with associated atelectasis. Bony thorax is unremarkable. IMPRESSION: Stable large hiatal hernia. Mild bilateral pleural effusions are noted with associated atelectasis. Electronically Signed   By: Marijo Conception, M.D.   On: 09/17/2018 14:16   Dg Chest 2 View  Result Date: 09/07/2018 CLINICAL DATA:  Increasing shortness of breath. Decreased oxygen saturation since yesterday. EXAM: CHEST - 2 VIEW COMPARISON:  Single-view of the chest and CT chest 08/29/2018. FINDINGS: There is cardiomegaly without edema. Moderate left pleural effusion and basilar atelectasis are seen. Pulmonary nodules in the left upper lobe and along the periphery of the left lung are identified as seen on prior CT. Small right pulmonary nodule seen on CT are difficult to visualize on this examination. Large hiatal hernia is noted. No pneumothorax. IMPRESSION: No change in a moderate left pleural effusion and left basilar atelectasis. Pulmonary nodules. Cardiomegaly without edema. Hiatal hernia. Electronically Signed   By: Inge Rise M.D.   On: 1January 11, 202019 09:02   Ct Head Wo Contrast  Result Date: 09/07/2018 CLINICAL DATA:  Sepsis. Recent diagnosis of suspected metastatic disease in chest abdomen and pelvis on CT studies. New left sided proptosis and left upper and lower extremity weakness. No reported injury. EXAM: CT HEAD WITHOUT CONTRAST TECHNIQUE: Contiguous axial images were obtained from the base of the  skull through the vertex without intravenous contrast. COMPARISON:  07/15/2017 head CT. FINDINGS: Brain: No evidence of parenchymal hemorrhage or extra-axial fluid collection. No mass lesion, mass effect, or midline shift. No CT evidence of acute infarction. Nonspecific mild subcortical and periventricular white matter hypodensity, most in keeping with chronic small vessel ischemic change. Cerebral volume is age appropriate. No ventriculomegaly. Vascular: No acute abnormality. Skull: No evidence of calvarial fracture. Sinuses/Orbits: No fluid levels. Mild mucoperiosteal thickening in the maxillary sinuses bilaterally. Other:  The mastoid air cells are unopacified. IMPRESSION: 1.  No evidence of acute intracranial abnormality. 2. Mild chronic small vessel ischemic changes in the cerebral white matter. 3. Mild chronic appearing paranasal sinusitis. Electronically Signed   By: Ilona Sorrel M.D.   On: 1January 11, 202019 12:06   Ct Angio Chest Pe W/cm &/or Wo Cm  Result Date: 09/07/2018 CLINICAL DATA:  Shortness of breath and weakness. 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 EXAM: CT ANGIOGRAPHY CHEST WITH CONTRAST TECHNIQUE: Multidetector CT imaging of the chest was performed using the standard protocol during bolus administration of intravenous contrast. Multiplanar CT image reconstructions and MIPs were obtained to evaluate the vascular anatomy. CONTRAST:  173m ISOVUE-370 IOPAMIDOL (ISOVUE-370) INJECTION 76% COMPARISON:  CTA of the chest on 08/29/2018 FINDINGS: Cardiovascular: The pulmonary arteries are adequately opacified.  There is no evidence of pulmonary embolism. The thoracic aorta is normal in caliber and demonstrates no evidence of aneurysm or dissection. Proximal great vessels are normally patent. The heart size is stable and at the upper limits of normal. Pacemaker shows stable positioning. No pericardial fluid identified. Mediastinum/Nodes: Massive confluent  lymphadenopathy of the left axilla extending into the left subpectoral region and encasing the region of the brachial plexus, left axillary vessels and left subclavian vessels appears progressively enlarged since the prior CTA. Index lateral subpectoral lymph node measured previously at 2.8 cm in short axis now measures approximately 4.1 cm. More inferior left axillary node measured at 2.5 cm in short axis previously now measures 3.1 cm. Confluent lymph node mass encasement of vessels does not cause arterial occlusion but may be causing significant mass effect on venous structures. The veins are not opacified on the study to determine whether any venous thrombus is present. Associated right hilar and mediastinal lymphadenopathy also appears slightly more prominent compared to the prior study. Largest area right hilar lymphadenopathy measures approximately 2.2 cm. Stable large hiatal hernia. Lungs/Pleura: There also is progressive pulmonary involvement with lymphoma with definite progression since the prior CTA. All of the multiple bilateral pulmonary nodules show enlargement since the prior study. Index mass in the subpleural left lower lobe now measures approximately 2.2 x 3.7 cm (previously 1.7 x 3.3 cm). All other lesions also show enlargement. New small right pleural effusion. Small to moderate left pleural effusion appears stable since the prior study. Upper Abdomen: Stable splenomegaly. Musculoskeletal: No chest wall abnormality. No acute or significant osseous findings. Review of the MIP images confirms the above findings. IMPRESSION: 1. No evidence of pulmonary embolism. 2. Progression of lymphoma since the prior CTA on 11/25 with enlargement of left axillary, subpectoral and chest wall lymph node masses with encasement of left subclavian and axillary vascular structures. Enlargement of right hilar and mediastinal lymph nodes since the prior study. Pulmonary involvement also shows significant progression  with enlargement of all of the bilateral pulmonary nodule seen previously. 3. Normal arterial patency demonstrated of the left subclavian and axillary arteries. Venous patency cannot be determined on the CTA study and the patient would be at high risk of left upper extremity DVT given the large bulky lymphadenopathy present. 4. New small right pleural effusion. Stable small to moderate left pleural effusion. 5. Stable splenomegaly. Electronically Signed   By: Aletta Edouard M.D.   On: 112-17-2019 12:29   Ct Abdomen Pelvis W Contrast  Result Date: 08/31/2018 CLINICAL DATA:  Pulmonary nodules on previous chest CT suspicious for metastatic disease. EXAM: CT ABDOMEN AND PELVIS WITH CONTRAST TECHNIQUE: Multidetector CT imaging of the abdomen and pelvis was performed using the standard protocol following bolus administration of intravenous contrast. CONTRAST:  157m OMNIPAQUE IOHEXOL 300 MG/ML  SOLN COMPARISON:  Chest CT 08/29/2018 FINDINGS: Lower chest: Pacer wires noted in the heart. Large hiatal hernia. Bilateral pleural effusions, left greater than right. Pulmonary nodules noted in both lower lungs. Hepatobiliary: No focal abnormality within the liver parenchyma. Liver measures 20.1 cm craniocaudal length. Dependent sludge or stones noted in the gallbladder. No intrahepatic or extrahepatic biliary dilation. Pancreas: No focal mass lesion. No dilatation of the main duct. No intraparenchymal cyst. No peripancreatic edema. Spleen: Spleen measures 13.7 cm in craniocaudal length, upper normal to mildly increased. Adrenals/Urinary Tract: No adrenal nodule or mass. Kidneys unremarkable. Central sinus cysts noted left kidney. No evidence for hydroureter. The urinary bladder appears normal for the degree of distention.  Stomach/Bowel: Large hiatal hernia. Duodenum is normally positioned as is the ligament of Treitz. Duodenal diverticulum noted. No small bowel wall thickening. No small bowel dilatation. The terminal ileum  is normal. The appendix is not visualized, but there is no edema or inflammation in the region of the cecum. No gross colonic mass. No colonic wall thickening. Diverticular changes are noted in the left colon without evidence of diverticulitis. Vascular/Lymphatic: There is abdominal aortic atherosclerosis without aneurysm. Mild lymphadenopathy noted hepato duodenal ligament with 13 mm short axis index node visible on 21/3. Borderline para-aortic retroperitoneal lymphadenopathy evident. 10 mm short axis left para-aortic lymph node visible on 31/3. relatively bulky lymphadenopathy is seen along both pelvic sidewalls. 2.2 cm short axis left external iliac node is visible on 76/3. 2.8 cm short axis right external iliac lymph node associated with another 2.9 cm short axis node. 2.2 cm short axis lymph node identified in the right groin. Reproductive: Uterus unremarkable.  There is no adnexal mass. Other: No intraperitoneal free fluid. Musculoskeletal: Extensive body wall edema noted left lower hemithorax and left upper abdominal wall. Degenerative changes noted left hip. IMPRESSION: 1. Bulky pelvic sidewall lymphadenopathy with borderline to mild lymphadenopathy in the hepato duodenal ligament and para-aortic retroperitoneal space. Lymphoma or metastatic disease would be primary considerations. 2. Hepatomegaly with borderline splenomegaly. Prominent portal and splenic veins raise the question of portal venous hypertension. 3. Gallbladder sludge or tiny stones. 4. Left chest wall and upper abdominal wall edema. 5. Large hiatal hernia with bilateral pleural effusions, similar to prior. 6.  Aortic Atherosclerois (ICD10-170.0) Electronically Signed   By: Misty Stanley M.D.   On: 08/31/2018 13:13   Dg Chest Port 1 View  Result Date: 09/21/2018 CLINICAL DATA:  Tachypnea. EXAM: PORTABLE CHEST 1 VIEW COMPARISON:  Chest radiographs 2 days prior, chest CT 12020/01/518 FINDINGS: Right-sided pacemaker in place. Right upper  extremity PICC in place, tip in the proximal SVC. The patient is significantly rotated. Bilateral pleural effusions and associated airspace disease, likely progressed at the left lung base. Nodular density in the periphery of the left lung again seen. Large retrocardiac hiatal hernia. Unchanged heart size and mediastinal contours. No pulmonary edema. IMPRESSION: Bilateral pleural effusions, increased on the left. Unchanged radiographic appearance of large hiatal hernia. Peripheral nodular opacity as seen on prior chest CT. Additional pulmonary nodules on CT are not visualized radiographically Electronically Signed   By: Keith Rake M.D.   On: 09/21/2018 03:42   Dg Chest Port 1 View  Addendum Date: 09/14/2018   ADDENDUM REPORT: 09/14/2018 09:14 ADDENDUM: The impression should read: Stable chest x-ray demonstrating pulmonary edema and bilateral pleural effusions. Electronically Signed   By: Aletta Edouard M.D.   On: 09/14/2018 09:14   Result Date: 09/14/2018 CLINICAL DATA:  Shortness of breath. History of progressive high-grade B-cell lymphoma. EXAM: PORTABLE CHEST 1 VIEW COMPARISON:  Chest x-ray on 09/09/2018 and CT of the chest on 12020/01/518. FINDINGS: Stable heart size, appearance of pacemaker and large hiatal hernia. Stable probable bilateral pleural effusions and bilateral pulmonary nodules. No overt pulmonary edema. No pneumothorax. IMPRESSION: Stable chest x-ray demonstrating pulmonary lymphoma and bilateral pleural effusions. Electronically Signed: By: Aletta Edouard M.D. On: 09/11/2018 14:45   Dg Chest Port 1 View  Result Date: 09/14/2018 CLINICAL DATA:  Shortness of breath. EXAM: PORTABLE CHEST 1 VIEW COMPARISON:  Radiographs 09/11/2018 and 09/09/2018. FINDINGS: 0523 hours. Mild patient rotation to the right. The right arm PICC and right subclavian pacemaker leads appear unchanged. The heart size and mediastinal  contours are stable. There is a large hiatal hernia. There are persistent  bilateral pleural effusions which appear mildly improved on the right. There is no pneumothorax or confluent airspace opacity. IMPRESSION: No acute findings demonstrated. The right pleural effusion appears slightly smaller. Electronically Signed   By: Richardean Sale M.D.   On: 09/14/2018 09:10   Dg Chest Port 1 View  Result Date: 09/09/2018 CLINICAL DATA:  Hypoxia, history of progressive lymphoma EXAM: PORTABLE CHEST 1 VIEW COMPARISON:  12020-06-618 FINDINGS: Cardiac shadow is enlarged. Pacing device is again seen. Bilateral pleural effusions are again noted. The nodular changes seen on recent chest CT are not as well appreciated on today's exam. No pneumothorax is noted. IMPRESSION: Bilateral effusions. Patchy densities are noted consistent findings of recent CT although less well appreciated on today's exam. Electronically Signed   By: Inez Catalina M.D.   On: 09/09/2018 06:35   Dg Abd Portable 1v  Result Date: 09/13/2018 CLINICAL DATA:  Constipation EXAM: PORTABLE ABDOMEN - 1 VIEW COMPARISON:  CT 08/31/2018 FINDINGS: Nonobstructed bowel gas pattern. Residual contrast within the colon. Mild to moderate feces retention at the rectum. IMPRESSION: Nonobstructed gas pattern with mild to moderate fecal retention at the rectum Electronically Signed   By: Donavan Foil M.D.   On: 09/13/2018 23:51   Dg Swallowing Func-speech Pathology  Result Date: 09/15/2018 Objective Swallowing Evaluation: Type of Study: MBS-Modified Barium Swallow Study  Patient Details Name: Patricia Murillo MRN: [id number] Date of Birth: 09-Nov-1945 Today's Date: 09/15/2018 Time: SLP Start Time (ACUTE ONLY): 1210 -SLP Stop Time (ACUTE ONLY): 1240 SLP Time Calculation (min) (ACUTE ONLY): 30 min Past Medical History: Past Medical History: Diagnosis Date . Arthritis  . Hiatal hernia  . History of chicken pox  . Hyperlipidemia  . Pacemaker  Past Surgical History: Past Surgical History: Procedure Laterality Date . ANKLE SURGERY   . PACEMAKER  IMPLANT N/A 04/18/2018  Procedure: PACEMAKER IMPLANT;  Surgeon: Evans Lance, MD;  Location: [facility];  Service: Cardiovascular;  Laterality: N/A; HPI: SHAUNIECE KWAN is a 72 y.o. 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  During her hospitalization she underwent left axillary lymph node biopsy.  She was discharged home in a stable condition, but apparently she has been developing worsening weakness, generalized, worse with exertion, no improving factors, associated with malaise, chills and poor appetite.  Over the last 24 hours her symptoms have been more severe to the point where she has difficulty standing. She has persistent left upper extremity edema which has been painful, sharp in nature up to 10 out of 10 intensity.  Her weakness has led to dyspnea which has been severe in intensity, and triggered her emergency room visit today.Pt was made NPO yesterday due to concerns for aspiration.   Today pt is alert, has congested weak cough concerrning for airway protection.   Pt underwent MBS yesterday and was placed on full liquid diet with precautions. RN reports pt continues to cough with po intake.  Per note from MD, now plan is for pt to have small bore feeding tube placed for nutritional support.  Unfortunately pt did not tolerate attempt at insertion of feeding tube.  Repeat swallow evaluation ordered.  SLP follow up to inform family/pt of plan and determine if clinically pt appears improved.    Subjective: Alert Assessment / Plan / Recommendation CHL IP CLINICAL  IMPRESSIONS 09/15/2018 Clinical Impression Patient had a MBS on 09/12/18 and patient presented with Moderately severe oropharyngeal dysphagia with sensorimotor deficits. She presents much stronger today and presents with a mild oropharyngeal dysphagia. Oral preparation and bolus transfer was mild  evidenced by trace oral residue. Patient initiated swallow at the level of the pyriform sinuses with no penetration or aspiration with cup sips of all sizes. She did have penetration with cup sip with straw, but spontaneously cleared layngeal vestibule. She initiated swallow at the valleculae with puree and cracker bolus, no penetration or aspiration. Significant pharyngeal residue present after 1st swallow, but improved mostly cleared with second spontaneous swallow. Upper 1/3 of esphagus observed to clear well with puree and tablet. Due to significant sensorimotor improvement, recommend patient have regular diet with thin liquids, medication whole in puree. Speech therapy to follow up for continue diet tolerance and swallowing therapy. SLP Visit Diagnosis Dysphagia, oropharyngeal phase (R13.12) Attention and concentration deficit following -- Frontal lobe and executive function deficit following -- Impact on safety and function Mild aspiration risk   CHL IP TREATMENT RECOMMENDATION 09/15/2018 Treatment Recommendations Therapy as outlined in treatment plan below   Prognosis 09/15/2018 Prognosis for Safe Diet Advancement Good Barriers to Reach Goals -- Barriers/Prognosis Comment -- CHL IP DIET RECOMMENDATION 09/15/2018 SLP Diet Recommendations Regular solids;Thin liquid Liquid Administration via Cup;No straw Medication Administration Whole meds with liquid Compensations Slow rate;Small sips/bites;Other (Comment) Postural Changes Remain semi-upright after after feeds/meals (Comment);Seated upright at 90 degrees   CHL IP OTHER RECOMMENDATIONS 09/15/2018 Recommended Consults -- Oral Care Recommendations Oral care before and after PO Other Recommendations --   CHL IP FOLLOW UP RECOMMENDATIONS 09/14/2018 Follow up Recommendations (No Data)   CHL IP FREQUENCY AND DURATION 09/15/2018 Speech Therapy Frequency (ACUTE ONLY) min 2x/week Treatment Duration 1 week      CHL IP ORAL PHASE 09/15/2018 Oral Phase Impaired Oral -  Pudding Teaspoon -- Oral - Pudding Cup Lingual/palatal residue Oral - Honey Teaspoon -- Oral - Honey Cup -- Oral - Nectar Teaspoon NT Oral - Nectar Cup NT Oral - Nectar Straw NT Oral - Thin Teaspoon Lingual/palatal residue Oral - Thin Cup Lingual/palatal residue;Impaired mastication Oral - Thin Straw Lingual/palatal residue Oral - Puree Lingual/palatal residue Oral - Mech Soft Lingual/palatal residue Oral - Regular -- Oral - Multi-Consistency -- Oral - Pill -- Oral Phase - Comment --  CHL IP PHARYNGEAL PHASE 09/15/2018 Pharyngeal Phase Impaired Pharyngeal- Pudding Teaspoon Delayed swallow initiation-vallecula;Pharyngeal residue - valleculae;Pharyngeal residue - pyriform Pharyngeal -- Pharyngeal- Pudding Cup NT Pharyngeal -- Pharyngeal- Honey Teaspoon -- Pharyngeal -- Pharyngeal- Honey Cup -- Pharyngeal -- Pharyngeal- Nectar Teaspoon NT Pharyngeal -- Pharyngeal- Nectar Cup NT Pharyngeal -- Pharyngeal- Nectar Straw NT Pharyngeal -- Pharyngeal- Thin Teaspoon Delayed swallow initiation-pyriform sinuses;Reduced pharyngeal peristalsis;Reduced anterior laryngeal mobility;Reduced laryngeal elevation;Reduced airway/laryngeal closure;Reduced tongue base retraction;Pharyngeal residue - valleculae;Pharyngeal residue - pyriform Pharyngeal -- Pharyngeal- Thin Cup Delayed swallow initiation-pyriform sinuses;Reduced anterior laryngeal mobility;Reduced laryngeal elevation;Reduced airway/laryngeal closure;Reduced tongue base retraction;Pharyngeal residue - valleculae;Pharyngeal residue - pyriform;Reduced pharyngeal peristalsis Pharyngeal -- Pharyngeal- Thin Straw Delayed swallow initiation-pyriform sinuses;Reduced anterior laryngeal mobility;Reduced laryngeal elevation;Reduced airway/laryngeal closure;Reduced pharyngeal peristalsis;Reduced tongue base retraction;Pharyngeal residue - valleculae;Pharyngeal residue - pyriform;Penetration/Aspiration during swallow Pharyngeal -- Pharyngeal- Puree Delayed swallow  initiation-vallecula;Reduced laryngeal elevation;Reduced airway/laryngeal closure;Reduced tongue base retraction;Pharyngeal residue - valleculae Pharyngeal -- Pharyngeal- Mechanical Soft Delayed swallow initiation-vallecula;Reduced laryngeal elevation;Reduced airway/laryngeal closure;Reduced tongue base retraction Pharyngeal -- Pharyngeal- Regular NT Pharyngeal -- Pharyngeal- Multi-consistency -- Pharyngeal -- Pharyngeal- Pill Delayed swallow initiation-vallecula;Reduced laryngeal elevation;Reduced airway/laryngeal closure Pharyngeal --  Pharyngeal Comment --  CHL IP CERVICAL ESOPHAGEAL PHASE 09/15/2018 Cervical Esophageal Phase WFL Pudding Teaspoon -- Pudding Cup -- Honey Teaspoon -- Honey Cup -- Nectar Teaspoon -- Nectar Cup -- Nectar Straw -- Thin Teaspoon -- Thin Cup -- Thin Straw -- Puree -- Mechanical Soft -- Regular -- Multi-consistency -- Pill -- Cervical Esophageal Comment -- Charlynne Cousins Ward, MA, CCC-SLP 09/15/2018 2:59 PM              Dg Swallowing Func-speech Pathology  Result Date: 09/12/2018 Objective Swallowing Evaluation: Type of Study: MBS-Modified Barium Swallow Study  Patient Details Name: Patricia Murillo MRN: [id number] Date of Birth: 1945/12/02 Today's Date: 09/12/2018 Time: SLP Start Time (ACUTE ONLY): 1300 -SLP Stop Time (ACUTE ONLY): 1337 SLP Time Calculation (min) (ACUTE ONLY): 37 min Past Medical History: Past Medical History: Diagnosis Date . Arthritis  . Hiatal hernia  . History of chicken pox  . Hyperlipidemia  . Pacemaker  Past Surgical History: Past Surgical History: Procedure Laterality Date . ANKLE SURGERY   . PACEMAKER IMPLANT N/A 04/18/2018  Procedure: PACEMAKER IMPLANT;  Surgeon: Evans Lance, MD;  Location: [facility];  Service: Cardiovascular;  Laterality: N/A; HPI: ITZEL MCKIBBIN is a 72 y.o. 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  During her hospitalization she underwent left axillary lymph node biopsy.  She was discharged home in a stable condition, but apparently she has been developing worsening weakness, generalized, worse with exertion, no improving factors, associated with malaise, chills and poor appetite.  Over the last 24 hours her symptoms have been more severe to the point where she has difficulty standing. She has persistent left upper extremity edema which has been painful, sharp in nature up to 10 out of 10 intensity.  Her weakness has led to dyspnea which has been severe in intensity, and triggered her emergency room visit today.Pt was made NPO yesterday due to concerns for aspiration.   Today pt is alert, has congested weak cough concerrning for airway protection.  Subjective: The patient was seen sitting upright in bed with her daughter at the bedside.  Assessment / Plan / Recommendation CHL IP CLINICAL IMPRESSIONS 09/12/2018 Clinical Impression Pt presents with moderately severe oropharyngeal dysphagia with sensorimotor deficits.  Decreased oral propulsion and weak pharyngeal motility results in significant pharyngeal more than oral residuals.  Pt does NOT sense residuals and is unable to "hock" to expectorate to clear them.  Chin tuck, head turn postures did NOT decrease residuals.  Pt did aspirate with thin liquids posterior trachea with delayed cough response.  Although she did not aspirate with thicker consistencies, increased residuals present which if aspiration post-swallow are more difficult for pulmonary clearance.  Pt is grossly weak at this time - uncertain of her baseline swallow function.  Pt is a high aspiration/malnutrition risk due to level of dysphagia and weak nonproductive cough.  If she is to consume po intake= it should be with accepted risks and mitigation strategies.  Question if pt's swallow with improve with improved medical status. SLP Visit Diagnosis Dysphagia, oropharyngeal  phase (R13.12) Attention and concentration deficit following -- Frontal lobe and executive function deficit following -- Impact on safety and function Severe aspiration risk;Risk for inadequate nutrition/hydration   CHL IP TREATMENT RECOMMENDATION 09/11/2018 Treatment Recommendations Therapy as outlined in treatment plan below   Prognosis 09/12/2018 Prognosis for [place]  Barriers to Reach Goals Severity of deficits;Cognitive deficits;Other (Comment) Barriers/Prognosis Comment -- CHL IP DIET RECOMMENDATION 09/12/2018 SLP Diet Recommendations (No Data) Liquid Administration via Cup Medication Administration Crushed with puree Compensations Slow rate;Small sips/bites;Follow solids with liquid Postural Changes Remain semi-upright after after feeds/meals (Comment);Seated upright at 90 degrees   CHL IP OTHER RECOMMENDATIONS 09/12/2018 Recommended Consults -- Oral Care Recommendations Oral care before and after PO Other Recommendations Have oral suction available   CHL IP FOLLOW UP RECOMMENDATIONS 09/12/2018 Follow up Recommendations (No Data)   CHL IP FREQUENCY AND DURATION 09/12/2018 Speech Therapy Frequency (ACUTE ONLY) min 2x/week Treatment Duration 1 week      CHL IP ORAL PHASE 09/12/2018 Oral Phase Impaired Oral - Pudding Teaspoon -- Oral - Pudding Cup -- Oral - Honey Teaspoon -- Oral - Honey Cup -- Oral - Nectar Teaspoon Weak lingual manipulation;Reduced posterior propulsion;Lingual/palatal residue Oral - Nectar Cup Weak lingual manipulation;Reduced posterior propulsion;Lingual/palatal residue Oral - Nectar Straw Weak lingual manipulation;Reduced posterior propulsion;Lingual/palatal residue Oral - Thin Teaspoon Weak lingual manipulation;Reduced posterior propulsion Oral - Thin Cup Weak lingual manipulation;Reduced posterior propulsion Oral - Thin Straw Reduced posterior propulsion Oral - Puree Weak lingual manipulation;Reduced posterior propulsion Oral - Mech Soft Weak lingual manipulation;Reduced  posterior propulsion Oral - Regular -- Oral - Multi-Consistency -- Oral - Pill -- Oral Phase - Comment --  CHL IP PHARYNGEAL PHASE 09/12/2018 Pharyngeal Phase Impaired Pharyngeal- Pudding Teaspoon -- Pharyngeal -- Pharyngeal- Pudding Cup -- Pharyngeal -- Pharyngeal- Honey Teaspoon -- Pharyngeal -- Pharyngeal- Honey Cup -- Pharyngeal -- Pharyngeal- Nectar Teaspoon Reduced pharyngeal peristalsis;Reduced epiglottic inversion;Reduced tongue base retraction;Pharyngeal residue - valleculae;Pharyngeal residue - pyriform Pharyngeal -- Pharyngeal- Nectar Cup Reduced pharyngeal peristalsis;Reduced epiglottic inversion;Reduced laryngeal elevation;Reduced airway/laryngeal closure;Reduced tongue base retraction;Pharyngeal residue - valleculae;Pharyngeal residue - pyriform;Penetration/Aspiration during swallow Pharyngeal Material enters airway, remains ABOVE vocal cords and not ejected out Pharyngeal- Nectar Straw Reduced pharyngeal peristalsis;Reduced epiglottic inversion;Reduced anterior laryngeal mobility;Reduced airway/laryngeal closure;Reduced tongue base retraction;Reduced laryngeal elevation;Pharyngeal residue - valleculae;Pharyngeal residue - pyriform Pharyngeal -- Pharyngeal- Thin Teaspoon Reduced pharyngeal peristalsis;Reduced epiglottic inversion;Reduced laryngeal elevation;Reduced airway/laryngeal closure;Reduced tongue base retraction;Pharyngeal residue - valleculae;Pharyngeal residue - pyriform Pharyngeal -- Pharyngeal- Thin Cup Reduced pharyngeal peristalsis;Reduced epiglottic inversion;Reduced laryngeal elevation;Reduced airway/laryngeal closure;Reduced tongue base retraction;Pharyngeal residue - valleculae;Pharyngeal residue - pyriform;Penetration/Aspiration during swallow Pharyngeal Material enters airway, passes BELOW cords without attempt by patient to eject out (silent aspiration) Pharyngeal- Thin Straw Reduced pharyngeal peristalsis;Reduced epiglottic inversion;Reduced laryngeal elevation;Reduced  airway/laryngeal closure;Reduced tongue base retraction;Pharyngeal residue - valleculae;Pharyngeal residue - pyriform Pharyngeal Material enters airway, passes BELOW cords and not ejected out despite cough attempt by patient Pharyngeal- Puree Reduced epiglottic inversion;Reduced pharyngeal peristalsis;Reduced tongue base retraction;Reduced airway/laryngeal closure;Reduced laryngeal elevation;Pharyngeal residue - valleculae;Pharyngeal residue - pyriform Pharyngeal -- Pharyngeal- Mechanical Soft Reduced pharyngeal peristalsis;Reduced epiglottic inversion;Reduced tongue base retraction;Pharyngeal residue - valleculae;Reduced airway/laryngeal closure Pharyngeal -- Pharyngeal- Regular -- Pharyngeal -- Pharyngeal- Multi-consistency -- Pharyngeal -- Pharyngeal- Pill -- Pharyngeal -- Pharyngeal Comment --  CHL IP CERVICAL ESOPHAGEAL PHASE 09/12/2018 Cervical Esophageal Phase Impaired Pudding Teaspoon -- Pudding Cup -- Honey Teaspoon -- Honey Cup -- Nectar Teaspoon -- Nectar Cup -- Nectar Straw -- Thin Teaspoon -- Thin Cup -- Thin Straw -- Puree -- Mechanical Soft -- Regular -- Multi-consistency -- Pill -- Cervical Esophageal Comment upon esophageal sweep, pt appeared clear Luanna Salk, MS [facility] SLP Acute Rehab Services Pager [phone] Office [phone] Macario Golds 09/12/2018, 5:29 PM              Ir Picc Placement Right [place]  Img Guide  Result Date: 09/09/2018 INDICATION: Acute lymphoma, access for chemotherapy as an inpatient EXAM: ULTRASOUND AND FLUOROSCOPIC GUIDED PICC LINE INSERTION MEDICATIONS: 1% lidocaine local CONTRAST:  None FLUOROSCOPY TIME:  Twenty-four seconds (8 mGy) COMPLICATIONS: None immediate. TECHNIQUE: The procedure, risks, benefits, and alternatives were explained to the patient and informed written consent was obtained. A timeout was performed prior to the initiation of the procedure. The right upper extremity was prepped with chlorhexidine in a sterile fashion, and a sterile drape  was applied covering the operative field. Maximum barrier sterile technique with sterile gowns and gloves were used for the procedure. A timeout was performed prior to the initiation of the procedure. Local anesthesia was provided with 1% lidocaine. Under direct ultrasound guidance, the right brachial vein was accessed with a micropuncture kit after the overlying soft tissues were anesthetized with 1% lidocaine. An ultrasound image was saved for documentation purposes. A guidewire was advanced to the level of the superior caval-atrial junction for measurement purposes and the PICC line was cut to length. A peel-away sheath was placed and a 36 cm, 5 [place], dual lumen was inserted to level of the superior caval-atrial junction. A post procedure spot fluoroscopic was obtained. The catheter easily aspirated and flushed and was sutured in place. A dressing was placed. The patient tolerated the procedure well without immediate post procedural complication. FINDINGS: After catheter placement, the tip lies within the superior cavoatrial junction. The catheter aspirates and flushes normally and is ready for immediate use. IMPRESSION: Successful ultrasound and fluoroscopic guided placement of a right brachial vein approach, 36 cm, 5 French, dual lumen PICC with tip at the superior caval-atrial junction. The PICC line is ready for immediate use. Electronically Signed   By: Jerilynn Mages.  Shick M.D.   On: 09/09/2018 09:23   [place] Ekg Site Rite  Result Date: 09/08/2018 If Site Rite image not attached, placement could not be confirmed due to current cardiac rhythm.    CBC Recent Labs  Lab 09/23/18 0950 09/24/18 0648 09/25/18 0523 09/26/18 0511 09/28/18 0500 09/29/18 1007  WBC 12.7* 12.9* 9.5 10.0 12.4* 10.6*  HGB 10.5* 11.0* 10.7* 10.9* 10.6* 10.1*  HCT 31.9* 33.9* 33.4* 34.3* 33.8* 32.4*  PLT 239 354 347 272 502* 399  MCV 87.6 88.1 87.4 88.2 90.9 90.3  MCH 28.8 28.6 28.0 28.0 28.5 28.1  MCHC 32.9 32.4 32.0 31.8 31.4  31.2  RDW 15.0 15.7* 15.9* 16.5* 17.2* 17.1*  LYMPHSABS 0.1* 0.3* 0.1*  --   --  0.2*  MONOABS 0.9 1.3* 1.0  --   --  0.5  EOSABS 0.0 0.0 0.0  --   --  0.0  BASOSABS 0.1 0.0 0.0  --   --  0.0    Chemistries  Recent Labs  Lab 09/25/18 0523 09/26/18 0511 09/28/18 0500 09/29/18 1007  NA 137 135 138 139  K 3.2* 3.6 4.3 3.4*  CL 103 103 106 108  CO2 _0 GLUCOSE 80 99 100* 127*  BUN _1 CREATININE 0.41* 0.33* 0.38* 0.43*  CALCIUM 7.8* 7.5* 8.0* 7.8*  MG  --  1.9  --   --   AST 17  --   --  24  ALT 18  --   --  21  ALKPHOS 84  --   --  72  BILITOT 0.6  --   --  0.6    Lab Results  Component Value Date   HGBA1C 5.3 09/13/2012  Berle Mull M.D on 09/29/2018 at 7:12 PM  Pager---[phone] Go to [email or web address] - [email or web address] for contact info  [facility] - Office  [phone]

## 2018-09-29 NOTE — Consult Note (Signed)
[place] Nurse wound follow up Wound type:sacral Unstageable Pressure Injury. Patient's son is in to visit, opts to leave room for exam/reassessment Measurement:3cm x 3.5cm with depth unable to be determined due 100% necrotic wound bed Wound bed:100% yellow eschar pulling away from periphery from 4-7 o'clock to reveal yellow slough. Drainage (amount, consistency, odor) small amount of yellow exudate on old dressing consistent with enzymatically debriding devitalized tissue Periwound: mild periwound erythema measuring 0.4cm circumferentially Dressing procedure/placement/frequency: Continue collagenase (Santyl) once daily applied in a 1/8 inch layer and topped with saline moisten gauze 2x2, followed by dry gauze 4x4s and topped with silicone foam.  Side to side positioning.    May wish to consult with [facility] to consider surgical (sharp) debridement at bedside, despite being on chemotherapy.  If you agree, please order consult.  [place] nursing team will see every 7-10 days, but will remain available to this patient, the nursing and medical teams.  Please re-consult if needed before next visit. Thanks, Maudie Flakes, MSN, RN, [place], Arther Abbott  Pager# [phone]

## 2018-09-29 NOTE — Progress Notes (Signed)
Physical Therapy Treatment Patient Details Name: Patricia Murillo MRN: [id number] DOB: [birth year] Today's Date: 09/29/2018    History of Present Illness 72 y.o. 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  During her hospitalization she underwent left axillary lymph node biopsy. Pt admitted with LUE edema, weakness, dyspnea. 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     PT Comments    Decreased activity tolerance today. Attempted sit to stand with Stedy and +2 total assist, pt unable to clear hips from bed and reported pain/fatigue/dizziness limited her from another attempt. Pt sat at edge of bed x 5 minutes.   Follow Up Recommendations  SNF     Equipment Recommendations       Recommendations for Other Services       Precautions / Restrictions Precautions Precautions: Fall Precaution Comments: decreased LUE contol Restrictions Weight Bearing Restrictions: No    Mobility  Bed Mobility Overal bed mobility: Needs Assistance Bed Mobility: Rolling;Sidelying to Sit Rolling: Mod assist   Supine to sit: Max assist Sit to supine: Max assist;+2 for physical assistance   General bed mobility comments: assist to raise trunk and to manage flaccid LUE  Transfers Overall transfer level: Needs assistance   Transfers: Sit to/from Stand Sit to Stand: From elevated surface;+2 physical assistance;Total assist         General transfer comment: attempted sit to stand with stedy, pt unable to clear hips from bed, refused a 2nd attempt 2* fatigue/pain/dizziness  Ambulation/Gait                 Stairs             Wheelchair Mobility    Modified Rankin (Stroke Patients Only)       Balance Overall balance assessment: Needs assistance Sitting-balance support: Bilateral upper extremity supported;Feet  supported Sitting balance-Leahy Scale: Fair                                      Cognition Arousal/Alertness: Awake/alert Behavior During Therapy: WFL for tasks assessed/performed Overall Cognitive Status: Within Functional Limits for tasks assessed                                        Exercises      General Comments        Pertinent Vitals/Pain Faces Pain Scale: Hurts whole lot Pain Location: "back" at sacral wound site Pain Descriptors / Indicators: Sore Pain Intervention(s): Limited activity within patient's tolerance;Monitored during session;Premedicated before session    Home Living                      Prior Function            PT Goals (current goals can now be found in the care plan section) Acute Rehab PT Goals Patient Stated Goal: to get stronger PT Goal Formulation: With patient/family Time For Goal Achievement: 09/25/18 Potential to Achieve Goals: Fair Progress towards PT goals: Not progressing toward goals - comment(pain, fatigue, weakness limiting progress)    Frequency    Min 2X/week      PT Plan Current plan remains appropriate    Co-evaluation   Reason for Co-Treatment:  Complexity of the patient's impairments (multi-system involvement);For patient/therapist safety PT goals addressed during session: Mobility/safety with mobility        AM-PAC PT "6 Clicks" Mobility   Outcome Measure  Help needed turning from your back to your side while in a flat bed without using bedrails?: A Lot Help needed moving from lying on your back to sitting on the side of a flat bed without using bedrails?: Total Help needed moving to and from a bed to a chair (including a wheelchair)?: Total Help needed standing up from a chair using your arms (e.g., wheelchair or bedside chair)?: Total Help needed to walk in hospital room?: Total Help needed climbing 3-5 steps with a railing? : Total 6 Click Score: 7    End of  Session   Activity Tolerance: Patient limited by fatigue;Patient limited by pain Patient left: in bed;with call bell/phone within reach;with family/visitor present Nurse Communication: Need for lift equipment;Mobility status PT Visit Diagnosis: Other abnormalities of gait and mobility (R26.89);Muscle weakness (generalized) (M62.81)     Time: 0102-7253 PT Time Calculation (min) (ACUTE ONLY): 21 min  Charges:  $Therapeutic Activity: 8-22 mins                     Blondell Reveal Kistler PT 09/29/2018  Acute Rehabilitation Services Pager [phone] Office [phone]

## 2018-09-29 NOTE — Progress Notes (Signed)
CSW has been following to assist with planning pt DC to SNF- [facility] was selected by pt and working on Ship broker. Updated pt and family at bedside today. Pt going to receive cycle 2 of chemotherapy prior to DC.  Sharren Bridge, MSW, LCSW Clinical Social Work 09/29/2018 [phone]

## 2018-09-29 NOTE — Progress Notes (Signed)
Patricia Murillo   DOB:1946-07-30   OJ#:[id number]   [phone]  Hem/Onc follow up note   Subjective: Pt is clinical stable, still has mild cough, afebrile, fatigue is unchanged, she still spends most time in the bed.   Objective:  Vitals:   09/29/18 1419 09/29/18 1456  BP: (!) 148/79 (!) 150/80  Pulse: (!) 111 89  Resp: 18 19  Temp: 99 F (37.2 C) 98.7 F (37.1 C)  SpO2: 95% 97%    Body mass index is 39.22 kg/m.  Intake/Output Summary (Last 24 hours) at 09/29/2018 1757 Last data filed at 09/29/2018 0901 Gross per 24 hour  Intake 240 ml  Output -  Net 240 ml     Sclerae unicteric  Oropharynx clear  (+) bulky adenopathy at left axilla and left upper front chest have much improved   Lungs clear -- scatter wheezing and rhonchi through both lungs   Heart regular rate and rhythm  Abdomen benign, soft   MSK no focal spinal tenderness, (+) severe edema of the left upper arm, no edema in legs   Neuro: alert, and follow commands. Pupil right bigger than left, both reactive, mild (+) ptosis of left eye.   CBG (last 3)  No results for input(s): GLUCAP in the last 72 hours.   Labs:  CBC Latest Ref Rng & Units 09/29/2018 09/28/2018 09/26/2018  WBC 4.0 - 10.5 K/uL 10.6(H) 12.4(H) 10.0  Hemoglobin 12.0 - 15.0 g/dL 10.1(L) 10.6(L) 10.9(L)  Hematocrit 36.0 - 46.0 % 32.4(L) 33.8(L) 34.3(L)  Platelets 150 - 400 K/uL 399 502(H) 272    CMP Latest Ref Rng & Units 09/29/2018 09/28/2018 09/26/2018  Glucose 70 - 99 mg/dL 127(H) 100(H) 99  BUN 8 - 23 mg/dL 8 10 11   Creatinine 0.44 - 1.00 mg/dL 0.43(L) 0.38(L) 0.33(L)  Sodium 135 - 145 mmol/L 139 138 135  Potassium 3.5 - 5.1 mmol/L 3.4(L) 4.3 3.6  Chloride 98 - 111 mmol/L 108 106 103  CO2 22 - 32 mmol/L 23 24 24   Calcium 8.9 - 10.3 mg/dL 7.8(L) 8.0(L) 7.5(L)  Total Protein 6.5 - 8.1 g/dL 5.3(L) - -  Total Bilirubin 0.3 - 1.2 mg/dL 0.6 - -  Alkaline Phos 38 - 126 U/L 72 - -  AST 15 - 41 U/L 24 - -  ALT 0 - 44 U/L 21 - -      Urine Studies No results for input(s): UHGB, CRYS in the last 72 hours.  Invalid input(s): UACOL, UAPR, USPG, UPH, UTP, UGL, UKET, UBIL, UNIT, UROB, ULEU, UEPI, UWBC, URBC, UBAC, CAST, [place], [place]  Basic Metabolic Panel: Recent Labs  Lab 09/25/18 0523 09/26/18 0511 09/28/18 0500 09/29/18 1007  NA 137 135 138 139  K 3.2* 3.6 4.3 3.4*  CL 103 103 106 108  CO2 24 24 24 23   GLUCOSE 80 99 100* 127*  BUN 10 11 10 8   CREATININE 0.41* 0.33* 0.38* 0.43*  CALCIUM 7.8* 7.5* 8.0* 7.8*  MG  --  1.9  --   --    GFR Estimated Creatinine Clearance: 81.2 mL/min (A) (by C-G formula based on SCr of 0.43 mg/dL (L)). Liver Function Tests: Recent Labs  Lab 09/25/18 0523 09/29/18 1007  AST 17 24  ALT 18 21  ALKPHOS 84 72  BILITOT 0.6 0.6  PROT 4.5* 5.3*  ALBUMIN 2.4* 2.7*   No results for input(s): LIPASE, AMYLASE in the last 168 hours. No results for input(s): AMMONIA in the last 168 hours. Coagulation profile No results for input(s):  INR, PROTIME in the last 168 hours.  CBC: Recent Labs  Lab 09/23/18 0950 09/24/18 0648 09/25/18 0523 09/26/18 0511 09/28/18 0500 09/29/18 1007  WBC 12.7* 12.9* 9.5 10.0 12.4* 10.6*  NEUTROABS 11.0* 10.2* 7.3  --   --  7.2  HGB 10.5* 11.0* 10.7* 10.9* 10.6* 10.1*  HCT 31.9* 33.9* 33.4* 34.3* 33.8* 32.4*  MCV 87.6 88.1 87.4 88.2 90.9 90.3  PLT 239 354 347 272 502* 399   Cardiac Enzymes: No results for input(s): CKTOTAL, CKMB, CKMBINDEX, TROPONINI in the last 168 hours. BNP: Invalid input(s): POCBNP CBG: No results for input(s): GLUCAP in the last 168 hours. D-Dimer No results for input(s): DDIMER in the last 72 hours. Hgb A1c No results for input(s): HGBA1C in the last 72 hours. Lipid Profile No results for input(s): CHOL, HDL, LDLCALC, TRIG, CHOLHDL, LDLDIRECT in the last 72 hours. Thyroid function studies No results for input(s): TSH, T4TOTAL, T3FREE, THYROIDAB in the last 72 hours.  Invalid input(s): FREET3 Anemia work up No  results for input(s): VITAMINB12, FOLATE, FERRITIN, TIBC, IRON, RETICCTPCT in the last 72 hours. Microbiology Recent Results (from the past 240 hour(s))  Culture, blood (routine x 2)     Status: Abnormal   Collection Time: 09/21/18  8:13 AM  Result Value Ref Range Status   Specimen Description   Final    BLOOD RIGHT ANTECUBITAL Performed at [facility], [place] [address] [place], [place] [address]    Special Requests   Final    BOTTLES DRAWN AEROBIC AND ANAEROBIC Blood Culture adequate volume Performed at [place] [address], [place], [place] [address]    Culture  Setup Time   Final    GRAM NEGATIVE RODS AEROBIC BOTTLE ONLY CRITICAL RESULT CALLED TO, READ BACK BY AND VERIFIED WITHLavell Luster [facility] 7253 09/22/18 A BROWNING Performed at [facility], [place] [address] [place], [place] [address]    Culture PSEUDOMONAS AERUGINOSA (A)  Final   Report Status 09/24/2018 FINAL  Final   Organism ID, Bacteria PSEUDOMONAS AERUGINOSA  Final      Susceptibility   Pseudomonas aeruginosa - MIC*    CEFTAZIDIME 4 SENSITIVE Sensitive     CIPROFLOXACIN <=0.25 SENSITIVE Sensitive     GENTAMICIN <=1 SENSITIVE Sensitive     IMIPENEM 2 SENSITIVE Sensitive     PIP/TAZO <=4 SENSITIVE Sensitive     CEFEPIME 4 SENSITIVE Sensitive     * PSEUDOMONAS AERUGINOSA  Blood Culture ID Panel (Reflexed)     Status: Abnormal   Collection Time: 09/21/18  8:13 AM  Result Value Ref Range Status   Enterococcus species NOT DETECTED NOT DETECTED Final   Listeria monocytogenes NOT DETECTED NOT DETECTED Final   Staphylococcus species NOT DETECTED NOT DETECTED Final   Staphylococcus aureus (BCID) NOT DETECTED NOT DETECTED Final   Streptococcus species NOT DETECTED NOT DETECTED Final   Streptococcus agalactiae NOT DETECTED NOT DETECTED Final   Streptococcus pneumoniae NOT DETECTED NOT DETECTED Final   Streptococcus pyogenes NOT DETECTED NOT DETECTED Final   Acinetobacter baumannii  NOT DETECTED NOT DETECTED Final   Enterobacteriaceae species NOT DETECTED NOT DETECTED Final   Enterobacter cloacae complex NOT DETECTED NOT DETECTED Final   Escherichia coli NOT DETECTED NOT DETECTED Final   Klebsiella oxytoca NOT DETECTED NOT DETECTED Final   Klebsiella pneumoniae NOT DETECTED NOT DETECTED Final   Proteus species NOT DETECTED NOT DETECTED Final   Serratia marcescens NOT DETECTED NOT DETECTED Final   Carbapenem resistance NOT  DETECTED NOT DETECTED Final   Haemophilus influenzae NOT DETECTED NOT DETECTED Final   Neisseria meningitidis NOT DETECTED NOT DETECTED Final   Pseudomonas aeruginosa DETECTED (A) NOT DETECTED Final    Comment: CRITICAL RESULT CALLED TO, READ BACK BY AND VERIFIED WITHLavell Luster [facility] 6789 09/22/18 A BROWNING    Candida albicans NOT DETECTED NOT DETECTED Final   Candida glabrata NOT DETECTED NOT DETECTED Final   Candida krusei NOT DETECTED NOT DETECTED Final   Candida parapsilosis NOT DETECTED NOT DETECTED Final   Candida tropicalis NOT DETECTED NOT DETECTED Final    Comment: Performed at [facility], [place] [address] [place], [place] [address]  Culture, blood (routine x 2)     Status: None   Collection Time: 09/21/18  8:56 AM  Result Value Ref Range Status   Specimen Description   Final    BLOOD RIGHT HAND Performed at [facility] [address], [place], [place] [address]    Special Requests   Final    BOTTLES DRAWN AEROBIC AND ANAEROBIC Blood Culture results may not be optimal due to an inadequate volume of blood received in culture bottles Performed at [facility] [address], [place], [place] [address]    Culture   Final    NO GROWTH 5 DAYS Performed at [facility], [place] [address] [place], [place] [address]    Report Status 09/26/2018 FINAL  Final  Urine Culture     Status: Abnormal   Collection Time: 09/21/18  3:43 PM  Result Value Ref Range Status   Specimen Description    Final    URINE, CLEAN CATCH Performed at [facility], [place] [address] [place], [place] [address]    Special Requests   Final    Immunocompromised Performed at [facility], [place] [address] [place], [place] [address]    Culture (A)  Final    20,000 COLONIES/mL PSEUDOMONAS AERUGINOSA 20,000 COLONIES/mL ENTEROCOCCUS FAECALIS    Report Status 09/24/2018 FINAL  Final   Organism ID, Bacteria PSEUDOMONAS AERUGINOSA (A)  Final   Organism ID, Bacteria ENTEROCOCCUS FAECALIS (A)  Final      Susceptibility   Enterococcus faecalis - MIC*    AMPICILLIN <=2 SENSITIVE Sensitive     LEVOFLOXACIN 1 SENSITIVE Sensitive     NITROFURANTOIN <=16 SENSITIVE Sensitive     VANCOMYCIN 1 SENSITIVE Sensitive     * 20,000 COLONIES/mL ENTEROCOCCUS FAECALIS   Pseudomonas aeruginosa - MIC*    CEFTAZIDIME 2 SENSITIVE Sensitive     CIPROFLOXACIN <=0.25 SENSITIVE Sensitive     GENTAMICIN <=1 SENSITIVE Sensitive     IMIPENEM 2 SENSITIVE Sensitive     PIP/TAZO 8 SENSITIVE Sensitive     CEFEPIME 2 SENSITIVE Sensitive     * 20,000 COLONIES/mL PSEUDOMONAS AERUGINOSA  Culture, blood (Routine X 2) w Reflex to ID Panel     Status: None   Collection Time: 09/23/18  6:17 PM  Result Value Ref Range Status   Specimen Description BLOOD RIGHT ARM  Final   Special Requests   Final    BOTTLES DRAWN AEROBIC AND ANAEROBIC Blood Culture adequate volume Performed at [facility], [place] [address], [place], [place] [address]    Culture NO GROWTH 5 DAYS  Final   Report Status 09/28/2018 FINAL  Final      Studies:  No results found.  Assessment: 72 y.o. with newly diagnosed high-grade B-cell lymphoma, recently discharged from hospital  after biopsy, presented to the emergency room with progressive weakness, dyspnea, chills and fever, anorexia, and extremity edema.  1.  Pseudomonas UTI and bacteremia, resolved, repeated blood cultures (-)  2.  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 Hypoxic respite failure, improved, off oxygen now  4.  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 Hypercalcemia secondary to malignancy, s/p zometa 12/5, resolved now  7. Severe arm and leg edema, secondary to severe adenopathy and hypoalbuminemia, improving  8. Deconditioning  9 left ptosis and dilated right pupil  10.  Sacral pressure ulcers   Plan:  -Lab reviewed, adequate a full chemo treatment, will proceed second cycle of R-CHOP today -I will increase her prednisone to 60 mg daily for 5 days, which is part of her chemo regiment.  After 5 days, please reduce prednisone to 10 mg daily for 7 days, then 5 mg daily for 7 days then stop. -OK to discharge to SNF after she completes chemo, from oncology standpoint  -I plan to give her Neulasta next week in my clinic, will arrange upon her discharge -Repeat CBC, CMP and uric acid tomorrow and the day after tomorrow -I will f/u   Truitt Merle  09/29/2018

## 2018-09-29 NOTE — Progress Notes (Signed)
Occupational Therapy Treatment Patient Details Name: Patricia Murillo MRN: [id number] DOB: Sep 20, 1946 Today's Date: 09/29/2018    History of present illness 72 y.o. 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  During her hospitalization she underwent left axillary lymph node biopsy. Pt admitted with LUE edema, weakness, dyspnea. 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    OT comments  Pts LUE with significant edema.  Will speak to RN regarding order for lymphedema wrapping  Follow Up Recommendations  SNF    Equipment Recommendations  None recommended by OT    Recommendations for Other Services      Precautions / Restrictions Precautions Precautions: Fall Precaution Comments: decreased LUE contol due to edema Restrictions Weight Bearing Restrictions: No       Mobility Bed Mobility Overal bed mobility: Needs Assistance Bed Mobility: Rolling;Sidelying to Sit Rolling: Mod assist   Supine to sit: Max assist Sit to supine: Max assist;+2 for physical assistance   General bed mobility comments: assist to raise trunk and to manage swollen LUE  Transfers Overall transfer level: Needs assistance   Transfers: Sit to/from Stand Sit to Stand: From elevated surface;+2 physical assistance;Total assist         General transfer comment: attempted sit to stand with stedy, pt unable to clear hips from bed, refused a 2nd attempt 2* fatigue/pain/dizziness    Balance Overall balance assessment: Needs assistance Sitting-balance support: Bilateral upper extremity supported;Feet supported Sitting balance-Leahy Scale: Fair                                     ADL either performed or assessed with clinical judgement   ADL Overall ADL's : Needs assistance/impaired                                        General ADL Comments: Limited participation with OT for ADL's thisOT session.  Pt seemed frustrated and anxious regarding chemo this day. Limited participation with therapy other than sitting EOB and attempting sit to stand.  Pts LUE VERY swollen which is limiting         Pertinent Vitals/ Pain       Pain Score: 8  Faces Pain Scale: Hurts whole lot Pain Location: "back" at sacral wound site Pain Descriptors / Indicators: Sore Pain Intervention(s): Repositioned;Limited activity within patient's tolerance;Premedicated before session     Prior Functioning/Environment              Frequency  Min 2X/week        Progress Toward Goals  OT Goals(current goals can now be found in the care plan section)        Plan         AM-PAC OT "6 Clicks" Daily Activity     Outcome Measure   Help from another person eating meals?: A Lot Help from another person taking care of personal grooming?: A Lot Help from another person toileting, which includes using toliet, bedpan, or urinal?: Total Help from another person bathing (including washing, rinsing, drying)?: Total Help from another person to put on and taking off regular upper body clothing?: Total Help from another person to put on and taking off regular lower body clothing?:  Total 6 Click Score: 8    End of Session Equipment Utilized During Treatment: Rolling walker  OT Visit Diagnosis: Unsteadiness on feet (R26.81);Muscle weakness (generalized) (M62.81)   Activity Tolerance Patient limited by fatigue;Patient limited by pain   Patient Left with call bell/phone within reach;with family/visitor present;in bed   Nurse Communication Mobility status        Time: 1019-1040 OT Time Calculation (min): 21 min  Charges: OT General Charges $OT Visit: 1 Visit OT Treatments $Self Care/Home Management : 8-22 mins  Kari Baars, [place] [phone] Office- [phone]      [place],  Edwena Felty D 09/29/2018, 3:18 PM

## 2018-09-30 LAB — CBC WITH DIFFERENTIAL/PLATELET
Abs Immature Granulocytes: 1.85 10*3/uL — ABNORMAL HIGH (ref 0.00–0.07)
Basophils Absolute: 0.1 10*3/uL (ref 0.0–0.1)
Basophils Relative: 1 %
EOS ABS: 0 10*3/uL (ref 0.0–0.5)
Eosinophils Relative: 0 %
HCT: 30 % — ABNORMAL LOW (ref 36.0–46.0)
Hemoglobin: 9.5 g/dL — ABNORMAL LOW (ref 12.0–15.0)
Immature Granulocytes: 16 %
LYMPHS ABS: 0.1 10*3/uL — AB (ref 0.7–4.0)
Lymphocytes Relative: 1 %
MCH: 28.9 pg (ref 26.0–34.0)
MCHC: 31.7 g/dL (ref 30.0–36.0)
MCV: 91.2 fL (ref 80.0–100.0)
Monocytes Absolute: 0.3 10*3/uL (ref 0.1–1.0)
Monocytes Relative: 3 %
Neutro Abs: 9.5 10*3/uL — ABNORMAL HIGH (ref 1.7–7.7)
Neutrophils Relative %: 79 %
Platelets: 452 10*3/uL — ABNORMAL HIGH (ref 150–400)
RBC: 3.29 MIL/uL — ABNORMAL LOW (ref 3.87–5.11)
RDW: 17.2 % — AB (ref 11.5–15.5)
WBC: 11.9 10*3/uL — ABNORMAL HIGH (ref 4.0–10.5)
nRBC: 0 % (ref 0.0–0.2)

## 2018-09-30 LAB — BASIC METABOLIC PANEL
Anion gap: 8 (ref 5–15)
BUN: 13 mg/dL (ref 8–23)
CO2: 22 mmol/L (ref 22–32)
CREATININE: 0.44 mg/dL (ref 0.44–1.00)
Calcium: 7.6 mg/dL — ABNORMAL LOW (ref 8.9–10.3)
Chloride: 108 mmol/L (ref 98–111)
GFR calc non Af Amer: >60 mL/min (ref >60)
Glucose, Bld: 107 mg/dL — ABNORMAL HIGH (ref 70–99)
Potassium: 3.9 mmol/L (ref 3.5–5.1)
Sodium: 138 mmol/L (ref 135–145)

## 2018-09-30 MED ORDER — HYDROCOD POLST-CPM POLST ER 10-8 MG/5ML PO SUER
5.0000 mL | Freq: Two times a day (BID) | ORAL | Status: DC
  Administered 2018-09-30 – 2018-10-05 (×9): 5 mL via ORAL
  Filled 2018-09-30 (×9): qty 5

## 2018-09-30 MED ORDER — POLYETHYLENE GLYCOL 3350 17 G PO PACK: 17.0000 g | PACK | Freq: Every day | ORAL | Status: DC | PRN

## 2018-09-30 MED ORDER — BENZONATATE 100 MG PO CAPS
200.0000 mg | ORAL_CAPSULE | Freq: Three times a day (TID) | ORAL | Status: DC
  Administered 2018-09-30 – 2018-10-05 (×16): 200 mg via ORAL
  Filled 2018-09-30 (×16): qty 2

## 2018-09-30 NOTE — Progress Notes (Signed)
Patricia Murillo   DOB:04/29/1946   MO#:[id number]   [phone]  Hem/Onc follow up note   Subjective: Pt tolerated the second cycle of R-CHOP very well yesterday, no reaction, or significant side effects so far.  She remains to be afebrile, with stable vital signs.  Still feels weak, spends most of her time in bed.  No other new complaints.  Objective:  Vitals:   09/30/18 0815 09/30/18 1323  BP:  128/62  Pulse:  74  Resp:  16  Temp:  97.8 F (36.6 C)  SpO2: 97% 96%    Body mass index is 39.22 kg/m.  Intake/Output Summary (Last 24 hours) at 09/30/2018 1757 Last data filed at 09/29/2018 1938 Gross per 24 hour  Intake 120 ml  Output -  Net 120 ml     Sclerae unicteric  Oropharynx clear  (+) bulky adenopathy at left axilla and left upper front chest have much improved   Lungs clear -- scatter wheezing and rhonchi through both lungs   Heart regular rate and rhythm  Abdomen benign, soft   MSK no focal spinal tenderness, (+) severe edema of the left upper arm, wrapped.  Neuro: alert, and follow commands. Pupil right bigger than left, both reactive, mild (+) ptosis of left eye. (+) Left conjunctive bleeding, mild ecchymosis around left eye  CBG (last 3)  No results for input(s): GLUCAP in the last 72 hours.   Labs:  CBC Latest Ref Rng & Units 09/30/2018 09/29/2018 09/28/2018  WBC 4.0 - 10.5 K/uL 11.9(H) 10.6(H) 12.4(H)  Hemoglobin 12.0 - 15.0 g/dL 9.5(L) 10.1(L) 10.6(L)  Hematocrit 36.0 - 46.0 % 30.0(L) 32.4(L) 33.8(L)  Platelets 150 - 400 K/uL 452(H) 399 502(H)    CMP Latest Ref Rng & Units 09/30/2018 09/29/2018 09/28/2018  Glucose 70 - 99 mg/dL 107(H) 127(H) 100(H)  BUN 8 - 23 mg/dL 13 8 10   Creatinine 0.44 - 1.00 mg/dL 0.44 0.43(L) 0.38(L)  Sodium 135 - 145 mmol/L 138 139 138  Potassium 3.5 - 5.1 mmol/L 3.9 3.4(L) 4.3  Chloride 98 - 111 mmol/L 108 108 106  CO2 22 - 32 mmol/L 22 23 24   Calcium 8.9 - 10.3 mg/dL 7.6(L) 7.8(L) 8.0(L)  Total Protein 6.5 - 8.1 g/dL -  5.3(L) -  Total Bilirubin 0.3 - 1.2 mg/dL - 0.6 -  Alkaline Phos 38 - 126 U/L - 72 -  AST 15 - 41 U/L - 24 -  ALT 0 - 44 U/L - 21 -     Urine Studies No results for input(s): UHGB, CRYS in the last 72 hours.  Invalid input(s): UACOL, UAPR, USPG, UPH, UTP, UGL, UKET, UBIL, UNIT, UROB, [place], UEPI, UWBC, Duwayne Heck [place], [place]  Basic Metabolic Panel: Recent Labs  Lab 09/25/18 0523 09/26/18 0511 09/28/18 0500 09/29/18 1007 09/30/18 1221  NA 137 135 138 139 138  K 3.2* 3.6 4.3 3.4* 3.9  CL 103 103 106 108 108  CO2 24 24 24 23 22   GLUCOSE 80 99 100* 127* 107*  BUN 10 11 10 8 13   CREATININE 0.41* 0.33* 0.38* 0.43* 0.44  CALCIUM 7.8* 7.5* 8.0* 7.8* 7.6*  MG  --  1.9  --   --   --    GFR Estimated Creatinine Clearance: 81.2 mL/min (by C-G formula based on SCr of 0.44 mg/dL). Liver Function Tests: Recent Labs  Lab 09/25/18 0523 09/29/18 1007  AST 17 24  ALT 18 21  ALKPHOS 84 72  BILITOT 0.6 0.6  PROT 4.5* 5.3*  ALBUMIN 2.4* 2.7*   No results for input(s): LIPASE, AMYLASE in the last 168 hours. No results for input(s): AMMONIA in the last 168 hours. Coagulation profile No results for input(s): INR, PROTIME in the last 168 hours.  CBC: Recent Labs  Lab 09/24/18 0648 09/25/18 0523 09/26/18 0511 09/28/18 0500 09/29/18 1007 09/30/18 1221  WBC 12.9* 9.5 10.0 12.4* 10.6* 11.9*  NEUTROABS 10.2* 7.3  --   --  7.2 9.5*  HGB 11.0* 10.7* 10.9* 10.6* 10.1* 9.5*  HCT 33.9* 33.4* 34.3* 33.8* 32.4* 30.0*  MCV 88.1 87.4 88.2 90.9 90.3 91.2  PLT 354 347 272 502* 399 452*   Cardiac Enzymes: No results for input(s): CKTOTAL, CKMB, CKMBINDEX, TROPONINI in the last 168 hours. BNP: Invalid input(s): POCBNP CBG: No results for input(s): GLUCAP in the last 168 hours. D-Dimer No results for input(s): DDIMER in the last 72 hours. Hgb A1c No results for input(s): HGBA1C in the last 72 hours. Lipid Profile No results for input(s): CHOL, HDL, LDLCALC, TRIG, CHOLHDL,  LDLDIRECT in the last 72 hours. Thyroid function studies No results for input(s): TSH, T4TOTAL, T3FREE, THYROIDAB in the last 72 hours.  Invalid input(s): FREET3 Anemia work up No results for input(s): VITAMINB12, FOLATE, FERRITIN, TIBC, IRON, RETICCTPCT in the last 72 hours. Microbiology Recent Results (from the past 240 hour(s))  Culture, blood (routine x 2)     Status: Abnormal   Collection Time: 09/21/18  8:13 AM  Result Value Ref Range Status   Specimen Description   Final    BLOOD RIGHT ANTECUBITAL Performed at [facility], [place] [address] [place], [place] [address]    Special Requests   Final    BOTTLES DRAWN AEROBIC AND ANAEROBIC Blood Culture adequate volume Performed at [facility] [address], [place], [place] [address]    Culture  Setup Time   Final    GRAM NEGATIVE RODS AEROBIC BOTTLE ONLY CRITICAL RESULT CALLED TO, READ BACK BY AND VERIFIED WITHLavell Luster [facility] 6578 09/22/18 A BROWNING Performed at [facility], [place] [address] [place], [place] [address]    Culture PSEUDOMONAS AERUGINOSA (A)  Final   Report Status 09/24/2018 FINAL  Final   Organism ID, Bacteria PSEUDOMONAS AERUGINOSA  Final      Susceptibility   Pseudomonas aeruginosa - MIC*    CEFTAZIDIME 4 SENSITIVE Sensitive     CIPROFLOXACIN <=0.25 SENSITIVE Sensitive     GENTAMICIN <=1 SENSITIVE Sensitive     IMIPENEM 2 SENSITIVE Sensitive     PIP/TAZO <=4 SENSITIVE Sensitive     CEFEPIME 4 SENSITIVE Sensitive     * PSEUDOMONAS AERUGINOSA  Blood Culture ID Panel (Reflexed)     Status: Abnormal   Collection Time: 09/21/18  8:13 AM  Result Value Ref Range Status   Enterococcus species NOT DETECTED NOT DETECTED Final   Listeria monocytogenes NOT DETECTED NOT DETECTED Final   Staphylococcus species NOT DETECTED NOT DETECTED Final   Staphylococcus aureus (BCID) NOT DETECTED NOT DETECTED Final   Streptococcus species NOT DETECTED NOT DETECTED Final   Streptococcus  agalactiae NOT DETECTED NOT DETECTED Final   Streptococcus pneumoniae NOT DETECTED NOT DETECTED Final   Streptococcus pyogenes NOT DETECTED NOT DETECTED Final   Acinetobacter baumannii NOT DETECTED NOT DETECTED Final   Enterobacteriaceae species NOT DETECTED NOT DETECTED Final   Enterobacter cloacae complex NOT DETECTED NOT DETECTED Final   Escherichia coli NOT DETECTED NOT DETECTED Final   Klebsiella oxytoca NOT DETECTED NOT DETECTED Final  Klebsiella pneumoniae NOT DETECTED NOT DETECTED Final   Proteus species NOT DETECTED NOT DETECTED Final   Serratia marcescens NOT DETECTED NOT DETECTED Final   Carbapenem resistance NOT DETECTED NOT DETECTED Final   Haemophilus influenzae NOT DETECTED NOT DETECTED Final   Neisseria meningitidis NOT DETECTED NOT DETECTED Final   Pseudomonas aeruginosa DETECTED (A) NOT DETECTED Final    Comment: CRITICAL RESULT CALLED TO, READ BACK BY AND VERIFIED WITHLavell Luster [facility] 1610 09/22/18 A BROWNING    Candida albicans NOT DETECTED NOT DETECTED Final   Candida glabrata NOT DETECTED NOT DETECTED Final   Candida krusei NOT DETECTED NOT DETECTED Final   Candida parapsilosis NOT DETECTED NOT DETECTED Final   Candida tropicalis NOT DETECTED NOT DETECTED Final    Comment: Performed at [facility], [address] [place], [place] [address]  Culture, blood (routine x 2)     Status: None   Collection Time: 09/21/18  8:56 AM  Result Value Ref Range Status   Specimen Description   Final    BLOOD RIGHT HAND Performed at [facility] [address], [place], [place] [address]    Special Requests   Final    BOTTLES DRAWN AEROBIC AND ANAEROBIC Blood Culture results may not be optimal due to an inadequate volume of blood received in culture bottles Performed at [facility] [address] [place], [place] [address]    Culture   Final    NO GROWTH 5 DAYS Performed at [facility], [place] [address],  [place], [place] [address]    Report Status 09/26/2018 FINAL  Final  Urine Culture     Status: Abnormal   Collection Time: 09/21/18  3:43 PM  Result Value Ref Range Status   Specimen Description   Final    URINE, CLEAN CATCH Performed at [facility], [place] [address] [place], [place] [address]    Special Requests   Final    Immunocompromised Performed at [facility], [place] [address] [place], [place] [address]    Culture (A)  Final    20,000 COLONIES/mL PSEUDOMONAS AERUGINOSA 20,000 COLONIES/mL ENTEROCOCCUS FAECALIS    Report Status 09/24/2018 FINAL  Final   Organism ID, Bacteria PSEUDOMONAS AERUGINOSA (A)  Final   Organism ID, Bacteria ENTEROCOCCUS FAECALIS (A)  Final      Susceptibility   Enterococcus faecalis - MIC*    AMPICILLIN <=2 SENSITIVE Sensitive     LEVOFLOXACIN 1 SENSITIVE Sensitive     NITROFURANTOIN <=16 SENSITIVE Sensitive     VANCOMYCIN 1 SENSITIVE Sensitive     * 20,000 COLONIES/mL ENTEROCOCCUS FAECALIS   Pseudomonas aeruginosa - MIC*    CEFTAZIDIME 2 SENSITIVE Sensitive     CIPROFLOXACIN <=0.25 SENSITIVE Sensitive     GENTAMICIN <=1 SENSITIVE Sensitive     IMIPENEM 2 SENSITIVE Sensitive     PIP/TAZO 8 SENSITIVE Sensitive     CEFEPIME 2 SENSITIVE Sensitive     * 20,000 COLONIES/mL PSEUDOMONAS AERUGINOSA  Culture, blood (Routine X 2) w Reflex to ID Panel     Status: None   Collection Time: 09/23/18  6:17 PM  Result Value Ref Range Status   Specimen Description BLOOD RIGHT ARM  Final   Special Requests   Final    BOTTLES DRAWN AEROBIC AND ANAEROBIC Blood Culture adequate volume Performed at [facility], [place] [address] [place], [place] [address]    Culture NO GROWTH 5 DAYS  Final  Report Status 09/28/2018 FINAL  Final      Studies:  No results found.  Assessment: 72 y.o. with newly diagnosed high-grade B-cell lymphoma, recently discharged from hospital after biopsy, presented to the emergency room  with progressive weakness, dyspnea, chills and fever, anorexia, and extremity edema.  1.  Pseudomonas UTI and bacteremia, resolved, repeated blood cultures (-)  2.  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 Hypoxic respite failure, improved, off oxygen now  4.  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 Hypercalcemia secondary to malignancy, s/p zometa 12/5, resolved now  7. Severe arm and leg edema, secondary to severe adenopathy and hypoalbuminemia, improving  8. Deconditioning  9 left ptosis and dilated right pupil  10.  Sacral pressure ulcers   Plan:  -Lab reviewed, stable -she is waiting SNF placement, okay to discharge from oncology standpoint. -I plan to give her Neulasta after hospital discharge -Please monitor CBC every 2 days. I anticipate her blood counts will start dropping mid next week  Truitt Merle  09/30/2018

## 2018-09-30 NOTE — Progress Notes (Signed)
Patient Demographics:    Patricia Murillo, is a 72 y.o. female, DOB - 1946/01/31, JQB:[id number]  Admit date - 09/07/2018   Admitting Physician Mauricio Gerome Apley, MD  Outpatient Primary MD for the patient is Patient, No Pcp Per  LOS - 70   Chief Complaint  Patient presents with  . Shortness of Breath        Subjective:   Cough was worsening last night.  No nausea no vomiting.  Had 6 bowel movement in last 24 hours.  No abdominal pain.  No blood in the stool.   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 with newly diagnosed high-grade B-cell lymphoma, recently discharged from hospital after biopsy, presented to the emergency room with progressive weakness, dyspnea, chills and fever, anorexia, and extremity edema, found to have  Pseudomonas bacteremia, as well as enterococcus and Pseudomonas UTI, currently on IV Levaquin , awaiting insurance approval for  transfer to SNF rehab   PLan:-  1) Neutropenic Fevers/Pseudomonas Aeruginosa Bacteremia/Pseudomonas UTI--clinically much improved, currently afebrile, WBC  12.9, Granix discontinued ,  blood cultures from 09/21/2018 with pseudomonas aeruginosa, urine culture also with Pseudomonas aeruginosa AND enterococcus "faecalis",, chest x-ray without acute findings, treated with IV cefepime started 09/21/2018 thru 09/24/18, verbal consultation on 09/22/18 with Dr Talbot Grumbling and ID Pharmacist , verbal consultation with Dr Michel Bickers on 09/24/18, stopped cefepime, start IV Levaquin 750  daily for 5 days starting on 09/24/2018 (Last dose 09/28/18) to cover for both Pseudomonas and enterococcus based on urine culture  2)Diffuse large B-cell lymphoma, activated B-cell type--- per Dr Burr Medico R-[facility] should be continued once acute issues resolve, next dose 09/30/18, continue prednisone  Getting second round of chemotherapy. Outpatient Neulasta. CBC every 2 days.  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 Burr Medico  Appreciated  5)Hypercalcemia secondary to malignancy- s/p Zometa  6)Acute hypoxic respiratory failure. Likely related to progressive high-grade B-cell lymphoma with pulmonary involvement comp located by suspected aspiration--- respiratory status appears back to baseline--- continue bronchodilators, IV Levaquin as above #1 (last dose 09/28/18) Continue supportive measures for cough suppression.  7)Acute toxic and metabolic encephalopathy----mentation is back to baseline as per family, it was multi-factorial including malignancy, encephalopathy has now resolved , hypercalcemia and obviously infection,   8)Moderate protein caloric malnutrition----due to underlying malignancy and poor oral intake, patient has low albumin and edema--- nutritional supplements advised, c/n Megace for appetite stimulation  9)Significant left upper extremity swelling and weakness----due to lymphatic obstruction by high-grade lymphoma, venous Dopplers negative of the DVT on 08/29/2018 Lymphedema wrap.  10)unstageable pressure injury in the sacrococcygeal area-  3.5cm x 1.5cm x 0cm --- please see wound care recommendations from ostomy RN dated 09/22/2018  11) subconjunctival hemorrhage. Left eye subconjunctival  hemorrhage likely secondary to coughing.  Monitor.  Disposition/Need for in-Hospital Stay- patient unable to be discharged at this time due to Pseudomonas bacteremia and UTI in an immunocompromised patient requiring IV antibiotics, also awaiting insurance approval for SNF rehab   Code Status : FULL    Disposition Plan  : SNF rehab  Consults  :  oncology   DVT Prophylaxis  :  Lovenox/TEDs - SCDs   Lab Results  Component Value Date   PLT 452 (H) 09/30/2018    Inpatient Medications  Scheduled Meds: . acyclovir  400 mg Oral BID  . allopurinol  300 mg Oral Daily  . amLODipine  5 mg Oral Daily  . benzonatate  200 mg Oral TID  . chlorpheniramine-HYDROcodone  5 mL Oral Q12H  . cholecalciferol  2,000 Units Oral Daily  . collagenase   Topical Daily  . enoxaparin (LOVENOX) injection  40 mg Subcutaneous Q24H  . famotidine  20 mg Oral Daily  . feeding supplement (ENSURE ENLIVE)  237 mL Oral BID BM  . fluticasone  2 spray Each Nare Daily  . folic acid  1 mg Oral Daily  . guaiFENesin  600 mg Oral BID  . lactobacillus acidophilus & bulgar  1 tablet Oral TID WC  . levalbuterol  0.63 mg Nebulization BID  . loratadine  10 mg Oral Daily  . mouth rinse  15 mL Mouth Rinse BID  . megestrol  400 mg Oral Daily  . mupirocin ointment   Topical BID  . pantoprazole  40 mg Oral BID  . predniSONE  60 mg Oral Q breakfast   Continuous Infusions: . sodium chloride Stopped (09/27/18 2114)  . sodium chloride Stopped (09/28/18 1533)   PRN Meds:.sodium chloride, sodium chloride, acetaminophen **OR** [DISCONTINUED] acetaminophen, alteplase, alum & mag hydroxide-simeth **AND** lidocaine, lidocaine, lip balm, meclizine, ondansetron **OR** ondansetron (ZOFRAN) IV, oxyCODONE, polyethylene glycol, senna-docusate, sodium chloride flush    Anti-infectives (From admission, onward)   Start     Dose/Rate Route Frequency Ordered Stop   09/24/18 1500  levofloxacin (LEVAQUIN) IVPB 750 mg     750 mg 100 mL/hr  over 90 Minutes Intravenous Every 24 hours 09/24/18 1023 09/28/18 1703   09/21/18 1200  ceFEPIme (MAXIPIME) 2 g in sodium chloride 0.9 % 100 mL IVPB     2 g 200 mL/hr over 30 Minutes Intravenous Every 8 hours 09/21/18 1027 09/24/18 1230   09/12/18 1600  ampicillin-sulbactam (UNASYN) 1.5 g in sodium chloride 0.9 % 100 mL IVPB  Status:  Discontinued     1.5 g 200 mL/hr over 30 Minutes Intravenous Every 6 hours 09/12/18 1408 09/15/18 0753   09/10/18 1000  acyclovir (ZOVIRAX) 200 MG capsule 400 mg     400 mg Oral 2 times daily 09/10/18 0754     09/07/18 2000  cefTRIAXone (ROCEPHIN) 1 g in sodium chloride 0.9 % 100 mL IVPB  Status:  Discontinued     1 g 200 mL/hr over 30 Minutes Intravenous Every 24 hours 09/07/18 1844 09/12/18 0940   09/07/18 0830  ceFEPIme (MAXIPIME) 2 g in sodium chloride 0.9 % 100 mL IVPB     2 g 200 mL/hr over 30 Minutes Intravenous  Once 09/07/18 0820 09/07/18 0912   09/07/18 0830  metroNIDAZOLE (FLAGYL) IVPB 500 mg  Status:  Discontinued     500 mg 100 mL/hr over 60 Minutes  Intravenous Every 8 hours 09/07/18 0820 09/08/18 1812   09/07/18 0830  vancomycin (VANCOCIN) IVPB 1000 mg/200 mL premix     1,000 mg 200 mL/hr over 60 Minutes Intravenous  Once 09/07/18 0820 09/07/18 1157        Objective:   Vitals:   09/29/18 2129 09/30/18 0538 09/30/18 0815 09/30/18 1323  BP: (!) 164/76 (!) 150/68  128/62  Pulse: 75 73  74  Resp: 20 16  16   Temp: 97.7 F (36.5 C) 97.7 F (36.5 C)  97.8 F (36.6 C)  TempSrc: Oral Oral  Oral  SpO2: 97% 98% 97% 96%  Weight:      Height:        Wt Readings from Last 3 Encounters:  09/27/18 110.2 kg  08/30/18 90.2 kg  08/04/18 83.5 kg     Intake/Output Summary (Last 24 hours) at 09/30/2018 1802 Last data filed at 09/29/2018 1938 Gross per 24 hour  Intake 120 ml  Output -  Net 120 ml     Physical Exam gen:- Awake,  Alert,  In no apparent distress, left eye subconjunctival hemorrhage. HEENT:- [place].AT, No sclera  icterus Neck-Supple Neck,No JVD,.  Lungs-  CTAB , fair symmetrical air movement CV- S1, S2 normal, regular  Abd-  +ve B.Sounds, Abd Soft, No tenderness,    Extremity:- pedal pulses present, improving edema of left upper extremity, bilateral lower extremity edema improving slowly, right upper extremity PICC line site w/o evidence of inflammation or infection Psych-affect is appropriate, and is coherent, oriented x3 Neuro-generalized weakness but no new focal deficits, no tremors Skin-unstageable pressure injury in the sacrococcygeal area- 3.5cm x 1.5cm x 0cm --- please see photo below Media Information   Document Information   Photos    09/23/2018 12:28  Attached To:  Hospital Encounter on 09/07/18  Source Information   Roxan Hockey, MD  [facility] On[facility]     Data Review:   Micro Results Recent Results (from the past 240 hour(s))  Culture, blood (routine x 2)     Status: Abnormal   Collection Time: 09/21/18  8:13 AM  Result Value Ref Range Status   Specimen Description   Final    BLOOD RIGHT ANTECUBITAL Performed at [facility], [address] [place], [place] [address]    Special Requests   Final    BOTTLES DRAWN AEROBIC AND ANAEROBIC Blood Culture adequate volume Performed at [facility] [address] [place],  [address]    Culture  Setup Time   Final    GRAM NEGATIVE RODS AEROBIC BOTTLE ONLY CRITICAL RESULT CALLED TO, READ BACK BY AND VERIFIED WITHLavell Luster [facility] [id number] 09/22/18 A BROWNING Performed at [facility], [place] [address], [place], [place] [address]    Culture PSEUDOMONAS AERUGINOSA (A)  Final   Report Status 09/24/2018 FINAL  Final   Organism ID, Bacteria PSEUDOMONAS AERUGINOSA  Final      Susceptibility   Pseudomonas aeruginosa - MIC*    CEFTAZIDIME 4 SENSITIVE Sensitive     CIPROFLOXACIN <=0.25 SENSITIVE Sensitive     GENTAMICIN <=1 SENSITIVE Sensitive     IMIPENEM 2 SENSITIVE Sensitive     PIP/TAZO  <=4 SENSITIVE Sensitive     CEFEPIME 4 SENSITIVE Sensitive     * PSEUDOMONAS AERUGINOSA  Blood Culture ID Panel (Reflexed)     Status: Abnormal   Collection Time: 09/21/18  8:13 AM  Result Value Ref Range Status   Enterococcus species NOT DETECTED NOT DETECTED  Final   Listeria monocytogenes NOT DETECTED NOT DETECTED Final   Staphylococcus species NOT DETECTED NOT DETECTED Final   Staphylococcus aureus (BCID) NOT DETECTED NOT DETECTED Final   Streptococcus species NOT DETECTED NOT DETECTED Final   Streptococcus agalactiae NOT DETECTED NOT DETECTED Final   Streptococcus pneumoniae NOT DETECTED NOT DETECTED Final   Streptococcus pyogenes NOT DETECTED NOT DETECTED Final   Acinetobacter baumannii NOT DETECTED NOT DETECTED Final   Enterobacteriaceae species NOT DETECTED NOT DETECTED Final   Enterobacter cloacae complex NOT DETECTED NOT DETECTED Final   Escherichia coli NOT DETECTED NOT DETECTED Final   Klebsiella oxytoca NOT DETECTED NOT DETECTED Final   Klebsiella pneumoniae NOT DETECTED NOT DETECTED Final   Proteus species NOT DETECTED NOT DETECTED Final   Serratia marcescens NOT DETECTED NOT DETECTED Final   Carbapenem resistance NOT DETECTED NOT DETECTED Final   Haemophilus influenzae NOT DETECTED NOT DETECTED Final   Neisseria meningitidis NOT DETECTED NOT DETECTED Final   Pseudomonas aeruginosa DETECTED (A) NOT DETECTED Final    Comment: CRITICAL RESULT CALLED TO, READ BACK BY AND VERIFIED WITHLavell Luster [facility] 4128 09/22/18 A BROWNING    Candida albicans NOT DETECTED NOT DETECTED Final   Candida glabrata NOT DETECTED NOT DETECTED Final   Candida krusei NOT DETECTED NOT DETECTED Final   Candida parapsilosis NOT DETECTED NOT DETECTED Final   Candida tropicalis NOT DETECTED NOT DETECTED Final    Comment: Performed at [facility], [address], [place], [place] [address]  Culture, blood (routine x 2)     Status: None   Collection Time: 09/21/18  8:56 AM  Result Value Ref  Range Status   Specimen Description   Final    BLOOD RIGHT HAND Performed at [facility] [address] [place], [place] [address]    Special Requests   Final    BOTTLES DRAWN AEROBIC AND ANAEROBIC Blood Culture results may not be optimal due to an inadequate volume of blood received in culture bottles Performed at [facility] [address] [place], [place] [address]    Culture   Final    NO GROWTH 5 DAYS Performed at [facility], [place] [address], [place], [place] [address]    Report Status 09/26/2018 FINAL  Final  Urine Culture     Status: Abnormal   Collection Time: 09/21/18  3:43 PM  Result Value Ref Range Status   Specimen Description   Final    URINE, CLEAN CATCH Performed at [facility], [place] [address] [place], [place] [address]    Special Requests   Final    Immunocompromised Performed at [facility], [place] [address] [place], [place] [address]    Culture (A)  Final    20,000 COLONIES/mL PSEUDOMONAS AERUGINOSA 20,000 COLONIES/mL ENTEROCOCCUS FAECALIS    Report Status 09/24/2018 FINAL  Final   Organism ID, Bacteria PSEUDOMONAS AERUGINOSA (A)  Final   Organism ID, Bacteria ENTEROCOCCUS FAECALIS (A)  Final      Susceptibility   Enterococcus faecalis - MIC*    AMPICILLIN <=2 SENSITIVE Sensitive     LEVOFLOXACIN 1 SENSITIVE Sensitive     NITROFURANTOIN <=16 SENSITIVE Sensitive     VANCOMYCIN 1 SENSITIVE Sensitive     * 20,000 COLONIES/mL ENTEROCOCCUS FAECALIS   Pseudomonas aeruginosa - MIC*    CEFTAZIDIME 2 SENSITIVE Sensitive     CIPROFLOXACIN <=0.25 SENSITIVE Sensitive     GENTAMICIN <=1 SENSITIVE Sensitive  IMIPENEM 2 SENSITIVE Sensitive     PIP/TAZO 8 SENSITIVE Sensitive     CEFEPIME 2 SENSITIVE Sensitive     * 20,000 COLONIES/mL PSEUDOMONAS AERUGINOSA  Culture, blood (Routine X 2) w Reflex to ID Panel     Status: None   Collection Time: 09/23/18  6:17 PM  Result Value  Ref Range Status   Specimen Description BLOOD RIGHT ARM  Final   Special Requests   Final    BOTTLES DRAWN AEROBIC AND ANAEROBIC Blood Culture adequate volume Performed at [facility] [address] [place], [place] [address]    Culture NO GROWTH 5 DAYS  Final   Report Status 09/28/2018 FINAL  Final    Radiology Reports Dg Chest 2 View  Result Date: 09/17/2018 CLINICAL DATA:  Cough. EXAM: CHEST - 2 VIEW COMPARISON:  Radiograph of September 14, 2018. FINDINGS: Stable cardiomediastinal silhouette. Large hiatal hernia is noted. No pneumothorax is noted. Right-sided pacemaker is unchanged in position. Mild bilateral pleural effusions are noted with associated atelectasis. Bony thorax is unremarkable. IMPRESSION: Stable large hiatal hernia. Mild bilateral pleural effusions are noted with associated atelectasis. Electronically Signed   By: Marijo Conception, M.D.   On: 09/17/2018 14:16   Dg Chest 2 View  Result Date: 09/07/2018 CLINICAL DATA:  Increasing shortness of breath. Decreased oxygen saturation since yesterday. EXAM: CHEST - 2 VIEW COMPARISON:  Single-view of the chest and CT chest 08/29/2018. FINDINGS: There is cardiomegaly without edema. Moderate left pleural effusion and basilar atelectasis are seen. Pulmonary nodules in the left upper lobe and along the periphery of the left lung are identified as seen on prior CT. Small right pulmonary nodule seen on CT are difficult to visualize on this examination. Large hiatal hernia is noted. No pneumothorax. IMPRESSION: No change in a moderate left pleural effusion and left basilar atelectasis. Pulmonary nodules. Cardiomegaly without edema. Hiatal hernia. Electronically Signed   By: Inge Rise M.D.   On: 12020-12-2217 09:02   Ct Head Wo Contrast  Result Date: 09/07/2018 CLINICAL DATA:  Sepsis. Recent diagnosis of suspected metastatic disease in chest abdomen and pelvis on CT studies. New left sided proptosis and left upper and  lower extremity weakness. No reported injury. EXAM: CT HEAD WITHOUT CONTRAST TECHNIQUE: Contiguous axial images were obtained from the base of the skull through the vertex without intravenous contrast. COMPARISON:  07/15/2017 head CT. FINDINGS: Brain: No evidence of parenchymal hemorrhage or extra-axial fluid collection. No mass lesion, mass effect, or midline shift. No CT evidence of acute infarction. Nonspecific mild subcortical and periventricular white matter hypodensity, most in keeping with chronic small vessel ischemic change. Cerebral volume is age appropriate. No ventriculomegaly. Vascular: No acute abnormality. Skull: No evidence of calvarial fracture. Sinuses/Orbits: No fluid levels. Mild mucoperiosteal thickening in the maxillary sinuses bilaterally. Other:  The mastoid air cells are unopacified. IMPRESSION: 1.  No evidence of acute intracranial abnormality. 2. Mild chronic small vessel ischemic changes in the cerebral white matter. 3. Mild chronic appearing paranasal sinusitis. Electronically Signed   By: Ilona Sorrel M.D.   On: 12020-12-2217 12:06   Ct Angio Chest Pe W/cm &/or Wo Cm  Result Date: 09/07/2018 CLINICAL DATA:  Shortness of breath and weakness. 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 EXAM: CT ANGIOGRAPHY CHEST WITH CONTRAST TECHNIQUE: Multidetector CT imaging of the chest was performed using the standard protocol during bolus administration of intravenous contrast. Multiplanar CT image reconstructions and MIPs were obtained to evaluate the vascular  anatomy. CONTRAST:  138m ISOVUE-370 IOPAMIDOL (ISOVUE-370) INJECTION 76% COMPARISON:  CTA of the chest on 08/29/2018 FINDINGS: Cardiovascular: The pulmonary arteries are adequately opacified. There is no evidence of pulmonary embolism. The thoracic aorta is normal in caliber and demonstrates no evidence of aneurysm or dissection. Proximal great vessels are normally patent. The heart size is stable and  at the upper limits of normal. Pacemaker shows stable positioning. No pericardial fluid identified. Mediastinum/Nodes: Massive confluent lymphadenopathy of the left axilla extending into the left subpectoral region and encasing the region of the brachial plexus, left axillary vessels and left subclavian vessels appears progressively enlarged since the prior CTA. Index lateral subpectoral lymph node measured previously at 2.8 cm in short axis now measures approximately 4.1 cm. More inferior left axillary node measured at 2.5 cm in short axis previously now measures 3.1 cm. Confluent lymph node mass encasement of vessels does not cause arterial occlusion but may be causing significant mass effect on venous structures. The veins are not opacified on the study to determine whether any venous thrombus is present. Associated right hilar and mediastinal lymphadenopathy also appears slightly more prominent compared to the prior study. Largest area right hilar lymphadenopathy measures approximately 2.2 cm. Stable large hiatal hernia. Lungs/Pleura: There also is progressive pulmonary involvement with lymphoma with definite progression since the prior CTA. All of the multiple bilateral pulmonary nodules show enlargement since the prior study. Index mass in the subpleural left lower lobe now measures approximately 2.2 x 3.7 cm (previously 1.7 x 3.3 cm). All other lesions also show enlargement. New small right pleural effusion. Small to moderate left pleural effusion appears stable since the prior study. Upper Abdomen: Stable splenomegaly. Musculoskeletal: No chest wall abnormality. No acute or significant osseous findings. Review of the MIP images confirms the above findings. IMPRESSION: 1. No evidence of pulmonary embolism. 2. Progression of lymphoma since the prior CTA on 11/25 with enlargement of left axillary, subpectoral and chest wall lymph node masses with encasement of left subclavian and axillary vascular structures.  Enlargement of right hilar and mediastinal lymph nodes since the prior study. Pulmonary involvement also shows significant progression with enlargement of all of the bilateral pulmonary nodule seen previously. 3. Normal arterial patency demonstrated of the left subclavian and axillary arteries. Venous patency cannot be determined on the CTA study and the patient would be at high risk of left upper extremity DVT given the large bulky lymphadenopathy present. 4. New small right pleural effusion. Stable small to moderate left pleural effusion. 5. Stable splenomegaly. Electronically Signed   By: GAletta EdouardM.D.   On: 104/28/202019 12:29   Dg Chest Port 1 View  Result Date: 09/21/2018 CLINICAL DATA:  Tachypnea. EXAM: PORTABLE CHEST 1 VIEW COMPARISON:  Chest radiographs 2 days prior, chest CT 104/28/202019 FINDINGS: Right-sided pacemaker in place. Right upper extremity PICC in place, tip in the proximal SVC. The patient is significantly rotated. Bilateral pleural effusions and associated airspace disease, likely progressed at the left lung base. Nodular density in the periphery of the left lung again seen. Large retrocardiac hiatal hernia. Unchanged heart size and mediastinal contours. No pulmonary edema. IMPRESSION: Bilateral pleural effusions, increased on the left. Unchanged radiographic appearance of large hiatal hernia. Peripheral nodular opacity as seen on prior chest CT. Additional pulmonary nodules on CT are not visualized radiographically Electronically Signed   By: MKeith RakeM.D.   On: 09/21/2018 03:42   Dg Chest Port 1 View  Addendum Date: 09/14/2018   ADDENDUM REPORT: 09/14/2018 09:14 ADDENDUM:  The impression should read: Stable chest x-ray demonstrating pulmonary edema and bilateral pleural effusions. Electronically Signed   By: Aletta Edouard M.D.   On: 09/14/2018 09:14   Result Date: 09/14/2018 CLINICAL DATA:  Shortness of breath. History of progressive high-grade B-cell lymphoma. EXAM:  PORTABLE CHEST 1 VIEW COMPARISON:  Chest x-ray on 09/09/2018 and CT of the chest on 109-22-2019. FINDINGS: Stable heart size, appearance of pacemaker and large hiatal hernia. Stable probable bilateral pleural effusions and bilateral pulmonary nodules. No overt pulmonary edema. No pneumothorax. IMPRESSION: Stable chest x-ray demonstrating pulmonary lymphoma and bilateral pleural effusions. Electronically Signed: By: Aletta Edouard M.D. On: 09/11/2018 14:45   Dg Chest Port 1 View  Result Date: 09/14/2018 CLINICAL DATA:  Shortness of breath. EXAM: PORTABLE CHEST 1 VIEW COMPARISON:  Radiographs 09/11/2018 and 09/09/2018. FINDINGS: 0523 hours. Mild patient rotation to the right. The right arm PICC and right subclavian pacemaker leads appear unchanged. The heart size and mediastinal contours are stable. There is a large hiatal hernia. There are persistent bilateral pleural effusions which appear mildly improved on the right. There is no pneumothorax or confluent airspace opacity. IMPRESSION: No acute findings demonstrated. The right pleural effusion appears slightly smaller. Electronically Signed   By: Richardean Sale M.D.   On: 09/14/2018 09:10   Dg Chest Port 1 View  Result Date: 09/09/2018 CLINICAL DATA:  Hypoxia, history of progressive lymphoma EXAM: PORTABLE CHEST 1 VIEW COMPARISON:  109-22-2019 FINDINGS: Cardiac shadow is enlarged. Pacing device is again seen. Bilateral pleural effusions are again noted. The nodular changes seen on recent chest CT are not as well appreciated on today's exam. No pneumothorax is noted. IMPRESSION: Bilateral effusions. Patchy densities are noted consistent findings of recent CT although less well appreciated on today's exam. Electronically Signed   By: Inez Catalina M.D.   On: 09/09/2018 06:35   Dg Abd Portable 1v  Result Date: 09/13/2018 CLINICAL DATA:  Constipation EXAM: PORTABLE ABDOMEN - 1 VIEW COMPARISON:  CT 08/31/2018 FINDINGS: Nonobstructed bowel gas pattern.  Residual contrast within the colon. Mild to moderate feces retention at the rectum. IMPRESSION: Nonobstructed gas pattern with mild to moderate fecal retention at the rectum Electronically Signed   By: Donavan Foil M.D.   On: 09/13/2018 23:51   Dg Swallowing Func-speech Pathology  Result Date: 09/15/2018 Objective Swallowing Evaluation: Type of Study: MBS-Modified Barium Swallow Study  Patient Details Name: JASMEET MANTON MRN: [id number] Date of Birth: Nov 01, 1945 Today's Date: 09/15/2018 Time: SLP Start Time (ACUTE ONLY): 1210 -SLP Stop Time (ACUTE ONLY): 1240 SLP Time Calculation (min) (ACUTE ONLY): 30 min Past Medical History: Past Medical History: Diagnosis Date . Arthritis  . Hiatal hernia  . History of chicken pox  . Hyperlipidemia  . Pacemaker  Past Surgical History: Past Surgical History: Procedure Laterality Date . ANKLE SURGERY   . PACEMAKER IMPLANT N/A 04/18/2018  Procedure: PACEMAKER IMPLANT;  Surgeon: Evans Lance, MD;  Location: [facility];  Service: Cardiovascular;  Laterality: N/A; HPI: QUIDA GLASSER is a 72 y.o. 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  During her hospitalization she underwent left axillary lymph node biopsy.  She was discharged home in a stable condition, but apparently she has been developing worsening weakness, generalized, worse with exertion, no improving factors, associated with malaise, chills and poor appetite.  Over the last 24 hours her symptoms have been more  severe to the point where she has difficulty standing. She has persistent left upper extremity edema which has been painful, sharp in nature up to 10 out of 10 intensity.  Her weakness has led to dyspnea which has been severe in intensity, and triggered her emergency room visit today.Pt was made NPO yesterday due to concerns for aspiration.   Today pt is  alert, has congested weak cough concerrning for airway protection.   Pt underwent MBS yesterday and was placed on full liquid diet with precautions. RN reports pt continues to cough with po intake.  Per note from MD, now plan is for pt to have small bore feeding tube placed for nutritional support.  Unfortunately pt did not tolerate attempt at insertion of feeding tube.  Repeat swallow evaluation ordered.  SLP follow up to inform family/pt of plan and determine if clinically pt appears improved.    Subjective: Alert Assessment / Plan / Recommendation CHL IP CLINICAL IMPRESSIONS 09/15/2018 Clinical Impression Patient had a MBS on 09/12/18 and patient presented with Moderately severe oropharyngeal dysphagia with sensorimotor deficits. She presents much stronger today and presents with a mild oropharyngeal dysphagia. Oral preparation and bolus transfer was mild evidenced by trace oral residue. Patient initiated swallow at the level of the pyriform sinuses with no penetration or aspiration with cup sips of all sizes. She did have penetration with cup sip with straw, but spontaneously cleared layngeal vestibule. She initiated swallow at the valleculae with puree and cracker bolus, no penetration or aspiration. Significant pharyngeal residue present after 1st swallow, but improved mostly cleared with second spontaneous swallow. Upper 1/3 of esphagus observed to clear well with puree and tablet. Due to significant sensorimotor improvement, recommend patient have regular diet with thin liquids, medication whole in puree. Speech therapy to follow up for continue diet tolerance and swallowing therapy. SLP Visit Diagnosis Dysphagia, oropharyngeal phase (R13.12) Attention and concentration deficit following -- Frontal lobe and executive function deficit following -- Impact on safety and function Mild aspiration risk   CHL IP TREATMENT RECOMMENDATION 09/15/2018 Treatment Recommendations Therapy as outlined in treatment plan below    Prognosis 09/15/2018 Prognosis for Safe Diet Advancement Good Barriers to Reach Goals -- Barriers/Prognosis Comment -- CHL IP DIET RECOMMENDATION 09/15/2018 SLP Diet Recommendations Regular solids;Thin liquid Liquid Administration via Cup;No straw Medication Administration Whole meds with liquid Compensations Slow rate;Small sips/bites;Other (Comment) Postural Changes Remain semi-upright after after feeds/meals (Comment);Seated upright at 90 degrees   CHL IP OTHER RECOMMENDATIONS 09/15/2018 Recommended Consults -- Oral Care Recommendations Oral care before and after PO Other Recommendations --   CHL IP FOLLOW UP RECOMMENDATIONS 09/14/2018 Follow up Recommendations (No Data)   CHL IP FREQUENCY AND DURATION 09/15/2018 Speech Therapy Frequency (ACUTE ONLY) min 2x/week Treatment Duration 1 week      CHL IP ORAL PHASE 09/15/2018 Oral Phase Impaired Oral - Pudding Teaspoon -- Oral - Pudding Cup Lingual/palatal residue Oral - Honey Teaspoon -- Oral - Honey Cup -- Oral - Nectar Teaspoon NT Oral - Nectar Cup NT Oral - Nectar Straw NT Oral - Thin Teaspoon Lingual/palatal residue Oral - Thin Cup Lingual/palatal residue;Impaired mastication Oral - Thin Straw Lingual/palatal residue Oral - Puree Lingual/palatal residue Oral - Mech Soft Lingual/palatal residue Oral - Regular -- Oral - Multi-Consistency -- Oral - Pill -- Oral Phase - Comment --  CHL IP PHARYNGEAL PHASE 09/15/2018 Pharyngeal Phase Impaired Pharyngeal- Pudding Teaspoon Delayed swallow initiation-vallecula;Pharyngeal residue - valleculae;Pharyngeal residue - pyriform Pharyngeal -- Pharyngeal- Pudding Cup NT Pharyngeal -- Pharyngeal- Honey Teaspoon --  Pharyngeal -- Pharyngeal- Honey Cup -- Pharyngeal -- Pharyngeal- Nectar Teaspoon NT Pharyngeal -- Pharyngeal- Nectar Cup NT Pharyngeal -- Pharyngeal- Nectar Straw NT Pharyngeal -- Pharyngeal- Thin Teaspoon Delayed swallow initiation-pyriform sinuses;Reduced pharyngeal peristalsis;Reduced anterior laryngeal  mobility;Reduced laryngeal elevation;Reduced airway/laryngeal closure;Reduced tongue base retraction;Pharyngeal residue - valleculae;Pharyngeal residue - pyriform Pharyngeal -- Pharyngeal- Thin Cup Delayed swallow initiation-pyriform sinuses;Reduced anterior laryngeal mobility;Reduced laryngeal elevation;Reduced airway/laryngeal closure;Reduced tongue base retraction;Pharyngeal residue - valleculae;Pharyngeal residue - pyriform;Reduced pharyngeal peristalsis Pharyngeal -- Pharyngeal- Thin Straw Delayed swallow initiation-pyriform sinuses;Reduced anterior laryngeal mobility;Reduced laryngeal elevation;Reduced airway/laryngeal closure;Reduced pharyngeal peristalsis;Reduced tongue base retraction;Pharyngeal residue - valleculae;Pharyngeal residue - pyriform;Penetration/Aspiration during swallow Pharyngeal -- Pharyngeal- Puree Delayed swallow initiation-vallecula;Reduced laryngeal elevation;Reduced airway/laryngeal closure;Reduced tongue base retraction;Pharyngeal residue - valleculae Pharyngeal -- Pharyngeal- Mechanical Soft Delayed swallow initiation-vallecula;Reduced laryngeal elevation;Reduced airway/laryngeal closure;Reduced tongue base retraction Pharyngeal -- Pharyngeal- Regular NT Pharyngeal -- Pharyngeal- Multi-consistency -- Pharyngeal -- Pharyngeal- Pill Delayed swallow initiation-vallecula;Reduced laryngeal elevation;Reduced airway/laryngeal closure Pharyngeal -- Pharyngeal Comment --  CHL IP CERVICAL ESOPHAGEAL PHASE 09/15/2018 Cervical Esophageal Phase WFL Pudding Teaspoon -- Pudding Cup -- Honey Teaspoon -- Honey Cup -- Nectar Teaspoon -- Nectar Cup -- Nectar Straw -- Thin Teaspoon -- Thin Cup -- Thin Straw -- Puree -- Mechanical Soft -- Regular -- Multi-consistency -- Pill -- Cervical Esophageal Comment -- Charlynne Cousins Ward, MA, CCC-SLP 09/15/2018 2:59 PM              Dg Swallowing Func-speech Pathology  Result Date: 09/12/2018 Objective Swallowing Evaluation: Type of Study: MBS-Modified Barium Swallow  Study  Patient Details Name: ALBIRDA SHIEL MRN: [id number] Date of Birth: May 10, 1946 Today's Date: 09/12/2018 Time: SLP Start Time (ACUTE ONLY): 1300 -SLP Stop Time (ACUTE ONLY): 1337 SLP Time Calculation (min) (ACUTE ONLY): 37 min Past Medical History: Past Medical History: Diagnosis Date . Arthritis  . Hiatal hernia  . History of chicken pox  . Hyperlipidemia  . Pacemaker  Past Surgical History: Past Surgical History: Procedure Laterality Date . ANKLE SURGERY   . PACEMAKER IMPLANT N/A 04/18/2018  Procedure: PACEMAKER IMPLANT;  Surgeon: Evans Lance, MD;  Location: [facility];  Service: Cardiovascular;  Laterality: N/A; HPI: BURNETTA KOHLS is a 72 y.o. 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  During her hospitalization she underwent left axillary lymph node biopsy.  She was discharged home in a stable condition, but apparently she has been developing worsening weakness, generalized, worse with exertion, no improving factors, associated with malaise, chills and poor appetite.  Over the last 24 hours her symptoms have been more severe to the point where she has difficulty standing. She has persistent left upper extremity edema which has been painful, sharp in nature up to 10 out of 10 intensity.  Her weakness has led to dyspnea which has been severe in intensity, and triggered her emergency room visit today.Pt was made NPO yesterday due to concerns for aspiration.   Today pt is alert, has congested weak cough concerrning for airway protection.  Subjective: The patient was seen sitting upright in bed with her daughter at the bedside.  Assessment / Plan / Recommendation CHL IP CLINICAL IMPRESSIONS 09/12/2018 Clinical Impression Pt presents with moderately severe oropharyngeal dysphagia with sensorimotor deficits.  Decreased oral propulsion and weak pharyngeal  motility results in significant pharyngeal more than oral residuals.  Pt does NOT sense residuals and is unable to "hock" to expectorate to clear  them.  Chin tuck, head turn postures did NOT decrease residuals.  Pt did aspirate with thin liquids posterior trachea with delayed cough response.  Although she did not aspirate with thicker consistencies, increased residuals present which if aspiration post-swallow are more difficult for pulmonary clearance.  Pt is grossly weak at this time - uncertain of her baseline swallow function.  Pt is a high aspiration/malnutrition risk due to level of dysphagia and weak nonproductive cough.  If she is to consume po intake= it should be with accepted risks and mitigation strategies.  Question if pt's swallow with improve with improved medical status. SLP Visit Diagnosis Dysphagia, oropharyngeal phase (R13.12) Attention and concentration deficit following -- Frontal lobe and executive function deficit following -- Impact on safety and function Severe aspiration risk;Risk for inadequate nutrition/hydration   CHL IP TREATMENT RECOMMENDATION 09/11/2018 Treatment Recommendations Therapy as outlined in treatment plan below   Prognosis 09/12/2018 Prognosis for Safe Diet Advancement Fair Barriers to Reach Goals Severity of deficits;Cognitive deficits;Other (Comment) Barriers/Prognosis Comment -- CHL IP DIET RECOMMENDATION 09/12/2018 SLP Diet Recommendations (No Data) Liquid Administration via Cup Medication Administration Crushed with puree Compensations Slow rate;Small sips/bites;Follow solids with liquid Postural Changes Remain semi-upright after after feeds/meals (Comment);Seated upright at 90 degrees   CHL IP OTHER RECOMMENDATIONS 09/12/2018 Recommended Consults -- Oral Care Recommendations Oral care before and after PO Other Recommendations Have oral suction available   CHL IP FOLLOW UP RECOMMENDATIONS 09/12/2018 Follow up Recommendations (No Data)   CHL IP FREQUENCY AND DURATION  09/12/2018 Speech Therapy Frequency (ACUTE ONLY) min 2x/week Treatment Duration 1 week      CHL IP ORAL PHASE 09/12/2018 Oral Phase Impaired Oral - Pudding Teaspoon -- Oral - Pudding Cup -- Oral - Honey Teaspoon -- Oral - Honey Cup -- Oral - Nectar Teaspoon Weak lingual manipulation;Reduced posterior propulsion;Lingual/palatal residue Oral - Nectar Cup Weak lingual manipulation;Reduced posterior propulsion;Lingual/palatal residue Oral - Nectar Straw Weak lingual manipulation;Reduced posterior propulsion;Lingual/palatal residue Oral - Thin Teaspoon Weak lingual manipulation;Reduced posterior propulsion Oral - Thin Cup Weak lingual manipulation;Reduced posterior propulsion Oral - Thin Straw Reduced posterior propulsion Oral - Puree Weak lingual manipulation;Reduced posterior propulsion Oral - Mech Soft Weak lingual manipulation;Reduced posterior propulsion Oral - Regular -- Oral - Multi-Consistency -- Oral - Pill -- Oral Phase - Comment --  CHL IP PHARYNGEAL PHASE 09/12/2018 Pharyngeal Phase Impaired Pharyngeal- Pudding Teaspoon -- Pharyngeal -- Pharyngeal- Pudding Cup -- Pharyngeal -- Pharyngeal- Honey Teaspoon -- Pharyngeal -- Pharyngeal- Honey Cup -- Pharyngeal -- Pharyngeal- Nectar Teaspoon Reduced pharyngeal peristalsis;Reduced epiglottic inversion;Reduced tongue base retraction;Pharyngeal residue - valleculae;Pharyngeal residue - pyriform Pharyngeal -- Pharyngeal- Nectar Cup Reduced pharyngeal peristalsis;Reduced epiglottic inversion;Reduced laryngeal elevation;Reduced airway/laryngeal closure;Reduced tongue base retraction;Pharyngeal residue - valleculae;Pharyngeal residue - pyriform;Penetration/Aspiration during swallow Pharyngeal Material enters airway, remains ABOVE vocal cords and not ejected out Pharyngeal- Nectar Straw Reduced pharyngeal peristalsis;Reduced epiglottic inversion;Reduced anterior laryngeal mobility;Reduced airway/laryngeal closure;Reduced tongue base retraction;Reduced laryngeal  elevation;Pharyngeal residue - valleculae;Pharyngeal residue - pyriform Pharyngeal -- Pharyngeal- Thin Teaspoon Reduced pharyngeal peristalsis;Reduced epiglottic inversion;Reduced laryngeal elevation;Reduced airway/laryngeal closure;Reduced tongue base retraction;Pharyngeal residue - valleculae;Pharyngeal residue - pyriform Pharyngeal -- Pharyngeal- Thin Cup Reduced pharyngeal peristalsis;Reduced epiglottic inversion;Reduced laryngeal elevation;Reduced airway/laryngeal closure;Reduced tongue base retraction;Pharyngeal residue - valleculae;Pharyngeal residue - pyriform;Penetration/Aspiration during swallow Pharyngeal Material enters airway, passes BELOW cords without attempt by patient to eject out (silent aspiration) Pharyngeal- Thin Straw Reduced pharyngeal peristalsis;Reduced epiglottic inversion;Reduced laryngeal elevation;Reduced airway/laryngeal closure;Reduced tongue base retraction;Pharyngeal residue - valleculae;Pharyngeal residue - pyriform Pharyngeal Material enters airway, passes BELOW cords and not ejected  out despite cough attempt by patient Pharyngeal- Puree Reduced epiglottic inversion;Reduced pharyngeal peristalsis;Reduced tongue base retraction;Reduced airway/laryngeal closure;Reduced laryngeal elevation;Pharyngeal residue - valleculae;Pharyngeal residue - pyriform Pharyngeal -- Pharyngeal- Mechanical Soft Reduced pharyngeal peristalsis;Reduced epiglottic inversion;Reduced tongue base retraction;Pharyngeal residue - valleculae;Reduced airway/laryngeal closure Pharyngeal -- Pharyngeal- Regular -- Pharyngeal -- Pharyngeal- Multi-consistency -- Pharyngeal -- Pharyngeal- Pill -- Pharyngeal -- Pharyngeal Comment --  CHL IP CERVICAL ESOPHAGEAL PHASE 09/12/2018 Cervical Esophageal Phase Impaired Pudding Teaspoon -- Pudding Cup -- Honey Teaspoon -- Honey Cup -- Nectar Teaspoon -- Nectar Cup -- Nectar Straw -- Thin Teaspoon -- Thin Cup -- Thin Straw -- Puree -- Mechanical Soft -- Regular -- Multi-consistency  -- Pill -- Cervical Esophageal Comment upon esophageal sweep, pt appeared clear Luanna Salk, MS [facility] SLP Acute Rehab Services Pager [phone] Office [phone] Macario Golds 09/12/2018, 5:29 PM              Ir Picc Placement Right >5 Yrs Inc Img Guide  Result Date: 09/09/2018 INDICATION: Acute lymphoma, access for chemotherapy as an inpatient EXAM: ULTRASOUND AND FLUOROSCOPIC GUIDED PICC LINE INSERTION MEDICATIONS: 1% lidocaine local CONTRAST:  None FLUOROSCOPY TIME:  Twenty-four seconds (8 mGy) COMPLICATIONS: None immediate. TECHNIQUE: The procedure, risks, benefits, and alternatives were explained to the patient and informed written consent was obtained. A timeout was performed prior to the initiation of the procedure. The right upper extremity was prepped with chlorhexidine in a sterile fashion, and a sterile drape was applied covering the operative field. Maximum barrier sterile technique with sterile gowns and gloves were used for the procedure. A timeout was performed prior to the initiation of the procedure. Local anesthesia was provided with 1% lidocaine. Under direct ultrasound guidance, the right brachial vein was accessed with a micropuncture kit after the overlying soft tissues were anesthetized with 1% lidocaine. An ultrasound image was saved for documentation purposes. A guidewire was advanced to the level of the superior caval-atrial junction for measurement purposes and the PICC line was cut to length. A peel-away sheath was placed and a 36 cm, 5 [place], dual lumen was inserted to level of the superior caval-atrial junction. A post procedure spot fluoroscopic was obtained. The catheter easily aspirated and flushed and was sutured in place. A dressing was placed. The patient tolerated the procedure well without immediate post procedural complication. FINDINGS: After catheter placement, the tip lies within the superior cavoatrial junction. The catheter aspirates and flushes normally and  is ready for immediate use. IMPRESSION: Successful ultrasound and fluoroscopic guided placement of a right brachial vein approach, 36 cm, 5 French, dual lumen PICC with tip at the superior caval-atrial junction. The PICC line is ready for immediate use. Electronically Signed   By: Jerilynn Mages.  Shick M.D.   On: 09/09/2018 09:23   [place] Ekg Site Rite  Result Date: 09/08/2018 If Site Rite image not attached, placement could not be confirmed due to current cardiac rhythm.    CBC Recent Labs  Lab 09/24/18 0648 09/25/18 0523 09/26/18 0511 09/28/18 0500 09/29/18 1007 09/30/18 1221  WBC 12.9* 9.5 10.0 12.4* 10.6* 11.9*  HGB 11.0* 10.7* 10.9* 10.6* 10.1* 9.5*  HCT 33.9* 33.4* 34.3* 33.8* 32.4* 30.0*  PLT 354 347 272 502* 399 452*  MCV 88.1 87.4 88.2 90.9 90.3 91.2  MCH 28.6 28.0 28.0 28.5 28.1 28.9  MCHC 32.4 32.0 31.8 31.4 31.2 31.7  RDW 15.7* 15.9* 16.5* 17.2* 17.1* 17.2*  LYMPHSABS 0.3* 0.1*  --   --  0.2* 0.1*  MONOABS 1.3* 1.0  --   --  0.5 0.3  EOSABS 0.0 0.0  --   --  0.0 0.0  BASOSABS 0.0 0.0  --   --  0.0 0.1    Chemistries  Recent Labs  Lab 09/25/18 0523 09/26/18 0511 09/28/18 0500 09/29/18 1007 09/30/18 1221  NA 137 135 138 139 138  K 3.2* 3.6 4.3 3.4* 3.9  CL 103 103 106 108 108  CO2 24 24 24 23 22   GLUCOSE 80 99 100* 127* 107*  BUN 10 11 10 8 13   CREATININE 0.41* 0.33* 0.38* 0.43* 0.44  CALCIUM 7.8* 7.5* 8.0* 7.8* 7.6*  MG  --  1.9  --   --   --   AST 17  --   --  24  --   ALT 18  --   --  21  --   ALKPHOS 84  --   --  72  --   BILITOT 0.6  --   --  0.6  --     Lab Results  Component Value Date   HGBA1C 5.3 09/13/2012    Author:  Berle Mull, MD [facility] Pager: [phone] 09/30/2018 6:04 PM    Go to [email or web address] - password [email or web address] for contact info  [facility] - Office  [phone]

## 2018-09-30 NOTE — Progress Notes (Signed)
Occupational Therapy Treatment Patient Details Name: Patricia Murillo MRN: [id number] DOB: 03/17/1946 Today's Date: 09/30/2018    History of present illness 72 y.o. 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  During her hospitalization she underwent left axillary lymph node biopsy. Pt admitted with LUE edema, weakness, dyspnea. 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    OT comments  OT session this day focused on lymphedema wrapping LUE.  Daughter present and educated on wrapping.  Pts daughter videoed part of OT session.  OT will follow up on lymphedema wrap next day.  Pt and daughter educated if wrap became painful or too uncomfortable to remove wrap and keep wrap.  Educated daughter in how to roll wrap up and options to follow up as an OP as pt is able.  Educated in options for compression sleeve after wrapping decreases size LUE.  Educated on importance of elevation as well as finger ROM of LUE.  Pt and daughter verbalized understanding.   Follow Up Recommendations  SNF    Equipment Recommendations  None recommended by OT    Recommendations for Other Services      Precautions / Restrictions Precautions Precautions: Fall Precaution Comments: decreased LUE contol due to edema Restrictions Weight Bearing Restrictions: No       Mobility Bed Mobility            NT      Transfers        NT                  ADL either performed or assessed with clinical judgement   ADL Overall ADL's : Needs assistance/impaired                    OT session this day focused on lymphedema wrap.  OT applied and educated daughter.                         Vision Patient Visual Report: No change from baseline            Cognition Arousal/Alertness: Awake/alert Behavior During Therapy: WFL for tasks  assessed/performed;Anxious;Agitated Overall Cognitive Status: Within Functional Limits for tasks assessed                                                     Pertinent Vitals/ Pain       Pain Score: 5  Pain Location: back Pain Descriptors / Indicators: Sore Pain Intervention(s): Repositioned            Progress Toward Goals  OT Goals(current goals can now be found in the care plan section)  Progress towards OT goals: Progressing toward goals(goal added for lymphedema wrapping)     Plan Discharge plan remains appropriate             Activity Tolerance Patient tolerated treatment well   Patient Left in bed;with call bell/phone within reach;with family/visitor present   Nurse Communication Mobility status, lymphedema wrap        Time: 6063-0160 OT Time Calculation (min): 53 min  Charges: OT General Charges $OT Visit: 1 Visit OT Treatments $Therapeutic Activity: 53-67 mins  Kari Baars, OT Acute Rehabilitation  Services [phone] Office- [phone]      Ramsha Lonigro, Edwena Felty D 09/30/2018, 10:30 AM

## 2018-09-30 NOTE — Progress Notes (Signed)
  Speech Language Pathology Treatment: Dysphagia  Patient Details Name: Patricia Murillo MRN: [id number] DOB: 1946-03-30 Today's Date: 09/30/2018 Time: 5621-3086 SLP Time Calculation (min) (ACUTE ONLY): 26 min  Assessment / Plan / Recommendation Clinical Impression  Skilled observation with thin via straw, puree and solids (minimal amount, snack) with min verbal cues provided for reminders/utilization of swallowing strategies without overt s/s of aspiration noted during intake; pt stated she is "stronger and not having difficulty with eating/drinking anymore."  ST will f/u for diet tolerance/education re: compensatory strategies prn with a meal to determine if fatigue factors into dysphagia symptoms; continue regular/thin liquid diet with general swallowing precautions in place.  HPI HPI: Patricia Murillo is a 72 y.o. 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   She was discharged home in a stable condition, but apparently, developed worsening weakness, generalized, worse with exertion, no improving factors, associated with malaise, chills and poor appetite.  Her weakness has led to dyspnea which has been severe in intensity, and triggered repeat hospitalization on 09/07/18.  Pt was made NPO due to concerns for aspiration.  Pt then exhibited a congested weak cough concerning for airway protection.   Pt underwent initial MBS with moderate oropharyngeal dysphagia noted and f/u on and was placed on full liquid diet on 09/12/18 with precautions. RN reports pt continues to cough with po intake.  Per note from MD, now plan was for pt to have small bore feeding tube placed for nutritional support.  Unfortunately pt did not tolerate attempt at insertion of feeding tube.  Repeat swallow evaluation ordered with MBS completed 09/15/18 and mild oropharyngeal dysphagia noted  with improved strength and she was then placed on regular/thin liquid diet.      SLP Plan  Continue with current plan of care       Recommendations  Diet recommendations: Regular;Thin liquid Liquids provided via: Straw;Cup Medication Administration: Whole meds with puree Supervision: Patient able to self feed Compensations: Slow rate;Small sips/bites Postural Changes and/or Swallow Maneuvers: Seated upright 90 degrees;Upright 30-60 min after meal                General recommendations: Other(comment)(TBD) Oral Care Recommendations: Oral care BID Follow up Recommendations: Other (comment)(TBD) SLP Visit Diagnosis: Dysphagia, unspecified (R13.10) Plan: Continue with current plan of care                       Elvina Sidle, M.S., CCC-SLP 09/30/2018, 2:53 PM

## 2018-10-01 DIAGNOSIS — K5900 Constipation, unspecified: Secondary | ICD-10-CM

## 2018-10-01 DIAGNOSIS — R627 Adult failure to thrive: Secondary | ICD-10-CM

## 2018-10-01 NOTE — Progress Notes (Signed)
Occupational Therapy Treatment Patient Details Name: Patricia Murillo MRN: [id number] DOB: [birth year] Today's Date: 10/01/2018    History of present illness 72 y.o. 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  During her hospitalization she underwent left axillary lymph node biopsy. Pt admitted with LUE edema, weakness, dyspnea. 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    OT comments  Reapplied compression wrap, performed washing/lotion prior to this.  Another OT had done initial wrap.  Edema significantly reduced from when this OT last saw pt.  Areas of increased edema were in fingers and upper arm.  Brought wrap up higher.  Educated pt/nursing to remove and save materials if she has increased pain or numbness  Follow Up Recommendations  SNF    Equipment Recommendations  None recommended by OT    Recommendations for Other Services      Precautions / Restrictions Precautions Precautions: Fall Restrictions Weight Bearing Restrictions: No       Mobility Bed Mobility                  Transfers                      Balance                                           ADL either performed or assessed with clinical judgement   ADL                                         General ADL Comments: focus of session on compression wrapping.  Washed and applied lotion as well as UE exercises prior to reapplying wrap     Vision       Perception     Praxis      Cognition Arousal/Alertness: Awake/alert Behavior During Therapy: WFL for tasks assessed/performed;Anxious;Agitated Overall Cognitive Status: Within Functional Limits for tasks assessed                                          Exercises Other Exercises Other Exercises: A/AAROM to LUE   Shoulder  Instructions       General Comments      Pertinent Vitals/ Pain       Pain Assessment: No/denies pain  Home Living                                          Prior Functioning/Environment              Frequency  Min 2X/week        Progress Toward Goals  OT Goals(current goals can now be found in the care plan section)  Progress towards OT goals: Progressing toward goals     Plan      Co-evaluation                 AM-PAC OT "6 Clicks" Daily Activity     Outcome  Measure   Help from another person eating meals?: A Lot Help from another person taking care of personal grooming?: A Lot Help from another person toileting, which includes using toliet, bedpan, or urinal?: Total Help from another person bathing (including washing, rinsing, drying)?: Total Help from another person to put on and taking off regular upper body clothing?: Total Help from another person to put on and taking off regular lower body clothing?: Total 6 Click Score: 8    End of Session    OT Visit Diagnosis: Unsteadiness on feet (R26.81);Muscle weakness (generalized) (M62.81)   Activity Tolerance Patient tolerated treatment well   Patient Left in bed;with call bell/phone within reach;with family/visitor present   Nurse Communication          Time: 5400-8676 OT Time Calculation (min): 41 min  Charges: OT General Charges $OT Visit: 1 Visit OT Treatments $Therapeutic Activity: 38-52 mins  Lesle Chris, OTR/L Acute Rehabilitation Services [phone] [facility] pager [phone] office 10/01/2018   Lachelle Rissler 10/01/2018, 12:57 PM

## 2018-10-01 NOTE — Progress Notes (Signed)
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)   Hypercalcemia   Hypernatremia   Aspiration into airway   Hypoxia   Neutropenic fever (HCC)   Pressure injury of skin   Bacteremia due to Pseudomonas  Neutropenic Fevers/Pseudomonas Aeruginosa Bacteremia/Pseudomonas UTI--clinically much improved, currently afebrile, WBC  12.9, Granix discontinued ,  blood cultures from 09/21/2018 with pseudomonas aeruginosa, urine culture also with Pseudomonas aeruginosa AND enterococcus "faecalis",, chest x-ray without acute findings, treated with IV cefepime started 09/21/2018 thru 09/24/18, verbal consultation on 09/22/18 with Dr Talbot Grumbling and ID Pharmacist , verbal consultation with Dr Michel Bickers on 09/24/18, stopped cefepime, start IV Levaquin 750 daily for 5 days starting on 09/24/2018 (Last dose 09/28/18) to cover for both Pseudomonas and enterococcus based on urine culture  Neutropenia resolved  Diffuse large B-cell lymphoma, activated B-cell type--- per Dr Burr Medico [facility] should be continued once acute issues resolve, next dose 09/30/18, continue prednisone  Getting second round of chemotherapy. Outpatient Neulasta. CBC every 2 days.  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  Burr Medico  Appreciated  Hypercalcemia secondary to malignancy- s/p Zometa  Acute hypoxic respiratory failure. Likely related to progressive high-grade B-cell lymphoma with pulmonary involvement comp located by suspected aspiration--- respiratory status appears back to baseline--- continue bronchodilators, IV Levaquin as above #1 (last dose 09/28/18) Continue supportive measures for cough suppression.  Acute toxic and metabolic encephalopathy----mentation is back to baseline as per family, it was multi-factorial including malignancy, encephalopathy has now resolved , hypercalcemia and obviously infection,   Moderate protein caloric malnutrition----due to underlying malignancy and poor oral intake, patient has low albumin and edema--- nutritional supplements advised, c/n Megace for appetite stimulation  Significant left upper extremity swelling and weakness----due to lymphatic obstruction by high-grade lymphoma, venous Dopplers negative of the DVT on 08/29/2018 Lymphedema wrap.  unstageable pressure injury in the sacrococcygeal area- 3.5cm x 1.5cm x 0cm--- please see wound care recommendations from ostomy RN dated 09/22/2018   subconjunctival hemorrhage. Left eye subconjunctival hemorrhage likely secondary to coughing.  Monitor.  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 in July 2019  FTT; SNF placement  Code Status: full, will need to verify with patient, there was documentation of DNR in the past  Family Communication: patient   Disposition Plan: SNF   Consultants:  oncology  Procedures:  As above  Antibiotics:  As above   Objective: BP 128/70 (BP Location: Right Arm)   Pulse 84   Temp (!) 97.4 F (36.3 C) (Oral)   Resp 18   Ht 5\' 6"  (1.676 m)   Wt 110.1 kg   SpO2 97%   BMI 39.18 kg/m  No intake or output data in the 24 hours ending 10/01/18 1421 Filed Weights   09/26/18 0500 09/27/18  0947  10/01/18 0500  Weight: 109.3 kg 110.2 kg 110.1 kg    Exam: Patient is examined daily including today on 10/01/2018, exams remain the same as of yesterday except that has changed    General:  NAD  Cardiovascular: paced rhytym  Respiratory: diminished at basis, no wheezing, no rales , no rhonchi  Abdomen: Soft/ND/NT, positive BS  Musculoskeletal:  left arm wrapped for chronic edema  Neuro: alert, oriented   Data Reviewed: Basic Metabolic Panel: Recent Labs  Lab 09/25/18 0523 09/26/18 0511 09/28/18 0500 09/29/18 1007 09/30/18 1221  NA 137 135 138 139 138  K 3.2* 3.6 4.3 3.4* 3.9  CL 103 103 106 108 108  CO2 24 24 24 23 22   GLUCOSE 80 99 100* 127* 107*  BUN 10 11 10 8 13   CREATININE 0.41* 0.33* 0.38* 0.43* 0.44  CALCIUM 7.8* 7.5* 8.0* 7.8* 7.6*  MG  --  1.9  --   --   --    Liver Function Tests: Recent Labs  Lab 09/25/18 0523 09/29/18 1007  AST 17 24  ALT 18 21  ALKPHOS 84 72  BILITOT 0.6 0.6  PROT 4.5* 5.3*  ALBUMIN 2.4* 2.7*   No results for input(s): LIPASE, AMYLASE in the last 168 hours. No results for input(s): AMMONIA in the last 168 hours. CBC: Recent Labs  Lab 09/25/18 0523 09/26/18 0511 09/28/18 0500 09/29/18 1007 09/30/18 1221  WBC 9.5 10.0 12.4* 10.6* 11.9*  NEUTROABS 7.3  --   --  7.2 9.5*  HGB 10.7* 10.9* 10.6* 10.1* 9.5*  HCT 33.4* 34.3* 33.8* 32.4* 30.0*  MCV 87.4 88.2 90.9 90.3 91.2  PLT 347 272 502* 399 452*   Cardiac Enzymes:   No results for input(s): CKTOTAL, CKMB, CKMBINDEX, TROPONINI in the last 168 hours. BNP (last 3 results) No results for input(s): BNP in the last 8760 hours.  ProBNP (last 3 results) No results for input(s): PROBNP in the last 8760 hours.  CBG: No results for input(s): GLUCAP in the last 168 hours.  Recent Results (from the past 240 hour(s))  Urine Culture     Status: Abnormal   Collection Time: 09/21/18  3:43 PM  Result Value Ref Range Status   Specimen Description   Final    URINE, CLEAN  CATCH Performed at [facility], [place] [address] [place], [place] [address]    Special Requests   Final    Immunocompromised Performed at [facility], [place] [address] [place], [place] [address]    Culture (A)  Final    20,000 COLONIES/mL PSEUDOMONAS AERUGINOSA 20,000 COLONIES/mL ENTEROCOCCUS FAECALIS    Report Status 09/24/2018 FINAL  Final   Organism ID, Bacteria PSEUDOMONAS AERUGINOSA (A)  Final   Organism ID, Bacteria ENTEROCOCCUS FAECALIS (A)  Final      Susceptibility   Enterococcus faecalis - MIC*    AMPICILLIN <=2 SENSITIVE Sensitive     LEVOFLOXACIN 1 SENSITIVE Sensitive     NITROFURANTOIN <=16 SENSITIVE Sensitive     VANCOMYCIN 1 SENSITIVE Sensitive     * 20,000 COLONIES/mL ENTEROCOCCUS FAECALIS   Pseudomonas aeruginosa - MIC*    CEFTAZIDIME 2 SENSITIVE Sensitive     CIPROFLOXACIN <=0.25 SENSITIVE Sensitive     GENTAMICIN <=1 SENSITIVE Sensitive     IMIPENEM 2 SENSITIVE Sensitive     PIP/TAZO 8 SENSITIVE Sensitive     CEFEPIME 2 SENSITIVE Sensitive     * 20,000 COLONIES/mL PSEUDOMONAS AERUGINOSA  Culture, blood (Routine X 2) w Reflex  to ID Panel     Status: None   Collection Time: 09/23/18  6:17 PM  Result Value Ref Range Status   Specimen Description BLOOD RIGHT ARM  Final   Special Requests   Final    BOTTLES DRAWN AEROBIC AND ANAEROBIC Blood Culture adequate volume Performed at [facility] [address] [place], [place] [address]    Culture NO GROWTH 5 DAYS  Final   Report Status 09/28/2018 FINAL  Final     Studies: No results found.  Scheduled Meds: . acyclovir  400 mg Oral BID  . allopurinol  300 mg Oral Daily  . amLODipine  5 mg Oral Daily  . benzonatate  200 mg Oral TID  . chlorpheniramine-HYDROcodone  5 mL Oral Q12H  . cholecalciferol  2,000 Units Oral Daily  . collagenase   Topical Daily  . enoxaparin (LOVENOX) injection  40 mg Subcutaneous Q24H  . famotidine  20 mg Oral Daily  .  feeding supplement (ENSURE ENLIVE)  237 mL Oral BID BM  . fluticasone  2 spray Each Nare Daily  . folic acid  1 mg Oral Daily  . guaiFENesin  600 mg Oral BID  . lactobacillus acidophilus & bulgar  1 tablet Oral TID WC  . levalbuterol  0.63 mg Nebulization BID  . loratadine  10 mg Oral Daily  . mouth rinse  15 mL Mouth Rinse BID  . megestrol  400 mg Oral Daily  . mupirocin ointment   Topical BID  . pantoprazole  40 mg Oral BID  . predniSONE  60 mg Oral Q breakfast    Continuous Infusions: . sodium chloride Stopped (09/27/18 2114)  . sodium chloride Stopped (09/28/18 1533)     Time spent: 57mins I have personally reviewed and interpreted on  10/01/2018 daily labs, tele strips, imagings as discussed above under date review session and assessment and plans.  I reviewed all nursing notes, pharmacy notes, consultant notes,  vitals, pertinent old records  I have discussed plan of care as described above with RN , patient on 10/01/2018   Florencia Reasons MD, PhD  [facility] Pager [phone]. If 7PM-7AM, please contact night-coverage at [email or web address], password [facility] 10/01/2018, 2:21 PM  LOS: 24 days

## 2018-10-02 NOTE — Progress Notes (Signed)
  Speech Language Pathology Treatment: Dysphagia  Patient Details Name: Patricia Murillo MRN: [id number] DOB: [birth year] Today's Date: 10/02/2018 Time: 7680-8811 SLP Time Calculation (min) (ACUTE ONLY): 14 min  Assessment / Plan / Recommendation Clinical Impression  Patient seen at bedside for f/u swallowing treatment. skilled observation complete by this SLP of patient consuming regular texture solids and thin liquids via straw without overt indication of aspiration, minimal cueing provided for improved upright posture to increase safety of swallow and facilitate improved esophageal clearance. Inquired about straw use as most recent MBS 12/12 recommended no straw, cup sips only. Patient without recollection of this strategy and per report, has been using straws since. Again, no signs of aspiration noted and based on vital signs and most recent CXR results, appears to be tolerating diet via self feeding. Given significant improvement from 12/9-12/12 when diet initiated, suspect that swallowing function has continued to improve with improved strength overall. No further SLP needs indicated, patient in agreement. Possible maintenance exercise program may be beneficial at SNF as dysphagia may continue to be exacerbated with changes in medical status in the future.    HPI HPI: Patricia Murillo is a 72 y.o. 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  During her hospitalization she underwent left axillary lymph node biopsy.  She was discharged home in a stable condition, but apparently she has been developing worsening weakness, generalized, worse with exertion, no improving factors, associated with malaise, chills and poor appetite.  Over the last 24 hours her symptoms have been more severe to the point where she has difficulty standing. She has persistent left upper  extremity edema which has been painful, sharp in nature up to 10 out of 10 intensity.  Her weakness has led to dyspnea which has been severe in intensity, and triggered her emergency room visit today.Pt was made NPO yesterday due to concerns for aspiration.   Today pt is alert, has congested weak cough concerrning for airway protection.   Pt underwent MBS yesterday and was placed on full liquid diet with precautions. RN reports pt continues to cough with po intake.  Per note from MD, now plan is for pt to have small bore feeding tube placed for nutritional support.  Unfortunately pt did not tolerate attempt at insertion of feeding tube.  Repeat swallow evaluation ordered.  SLP follow up to inform family/pt of plan and determine if clinically pt appears improved.         SLP Plan  All goals met       Recommendations  Diet recommendations: Regular;Thin liquid Liquids provided via: Straw;Cup Medication Administration: Whole meds with puree Supervision: Patient able to self feed Compensations: Slow rate;Small sips/bites;Follow solids with liquid Postural Changes and/or Swallow Maneuvers: Seated upright 90 degrees;Upright 30-60 min after meal                Oral Care Recommendations: Oral care BID Follow up Recommendations: Skilled Nursing facility SLP Visit Diagnosis: Dysphagia, unspecified (R13.10) Plan: All goals met       GO          Gabriel Rainwater MA, CCC-SLP        Patricia Murillo 10/02/2018, 3:27 PM

## 2018-10-02 NOTE — Progress Notes (Signed)
PROGRESS NOTE  Patricia Murillo HCW:[id number] DOB: Mar 15, 1946 DOA: 09/07/2018 PCP: Patient, No Pcp Per  HPI/Recap of past 24 hours:  No acute interval changes, no fever, on room air at rest, she does has intermittent congested cough Tendency to lean towards left Left arm edema has much improved  She reports she has been up for an hour this am, but nurse assistant states patient has not been up this am, she is oriented x3 though  Assessment/Plan: Active Problems:   Respiratory failure ([place])   Lymphoma ([place])   Sepsis ([place])   High grade B-cell lymphoma ([place])   Malnutrition of moderate degree   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 ([place])   Hypercalcemia   Hypernatremia   Aspiration into airway   Hypoxia   Neutropenic fever ([place])   Pressure injury of skin   Bacteremia due to Pseudomonas   Constipation   FTT (failure to thrive) in adult  Neutropenic Fevers/Pseudomonas Aeruginosa Bacteremia/Pseudomonas UTI--clinically much improved, currently afebrile, WBC  12.9, Granix discontinued ,  blood cultures from 09/21/2018 with pseudomonas aeruginosa, urine culture also with Pseudomonas aeruginosa AND enterococcus "faecalis",, chest x-ray without acute findings, treated with IV cefepime started 09/21/2018 thru 09/24/18, verbal consultation on 09/22/18 with Dr Talbot Grumbling and ID Pharmacist , verbal consultation with Dr Michel Bickers on 09/24/18, stopped cefepime, start IV Levaquin 750 daily for 5 days starting on 09/24/2018 (Last dose 09/28/18) to cover for both Pseudomonas and enterococcus based on urine culture  Neutropenia resolved  Diffuse large B-cell lymphoma, activated B-cell type--- per Dr Burr Medico R-CHOP should be continued once acute issues resolve, next dose 09/30/18, continue prednisone  Getting second round of chemotherapy. Outpatient Neulasta. CBC every 2 days.  Thrombocytopenia--due to lymphoma and chemo , thrombocytopenia has resolved  Autoimmune  hemolytic anemia, 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 Burr Medico  Appreciated  Hypercalcemia secondary to malignancy- s/p Zometa  Acute hypoxic respiratory failure. Likely related to progressive high-grade B-cell lymphoma with pulmonary involvement comp located by suspected aspiration--- respiratory status appears back to baseline--- continue bronchodilators, IV Levaquin as above #1 (last dose 09/28/18) Continue supportive measures for cough suppression.  Acute toxic and metabolic encephalopathy----mentation is back to baseline as per family, it was multi-factorial including malignancy, encephalopathy has now resolved , hypercalcemia and obviously infection,   Moderate protein caloric malnutrition----due to underlying malignancy and poor oral intake, patient has low albumin and edema--- nutritional supplements advised, c/n Megace for appetite stimulation  Significant left upper extremity swelling and weakness----due to lymphatic obstruction by high-grade lymphoma, venous Dopplers negative of the DVT on 08/29/2018 Lymphedema wrap.  unstageable pressure injury in the sacrococcygeal area- 3.5cm x 1.5cm x 0cm--- please see wound care recommendations from ostomy RN dated 09/22/2018   subconjunctival hemorrhage. Left eye subconjunctival hemorrhage likely secondary to coughing.  Monitor.  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 in July 2019  FTT; SNF placement  Code Status: full,  Verified with patient  Family Communication: patient   Disposition Plan: SNF   Consultants:  oncology  Procedures:  As above  Antibiotics:  As above   Objective: BP (!) 160/69 (BP Location: Right Leg)   Pulse 78   Temp 98 F (36.7 C) (Oral)    Resp 16   Ht 5\' 6"  (  1.676 m)   Wt 110.1 kg   SpO2 95%   BMI 39.18 kg/m   Intake/Output Summary (Last 24 hours) at 10/02/2018 1452 Last data filed at 10/02/2018 1020 Gross per 24 hour  Intake 600 ml  Output -  Net 600 ml   Filed Weights   09/26/18 0500 09/27/18 0611 10/01/18 0500  Weight: 109.3 kg 110.2 kg 110.1 kg    Exam: Patient is examined daily including today on 10/02/2018, exams remain the same as of yesterday except that has changed    General:  NAD, picc line on right arm  Cardiovascular: paced rhytym  Respiratory: diminished at basis, no wheezing, no rales , no rhonchi  Abdomen: Soft/ND/NT, positive BS  Musculoskeletal:  left arm wrapped for chronic edema, edema has much improved  Neuro: alert, oriented   Data Reviewed: Basic Metabolic Panel: Recent Labs  Lab 09/26/18 0511 09/28/18 0500 09/29/18 1007 09/30/18 1221  NA 135 138 139 138  K 3.6 4.3 3.4* 3.9  CL 103 106 108 108  CO2 24 24 23 22   GLUCOSE 99 100* 127* 107*  BUN 11 10 8 13   CREATININE 0.33* 0.38* 0.43* 0.44  CALCIUM 7.5* 8.0* 7.8* 7.6*  MG 1.9  --   --   --    Liver Function Tests: Recent Labs  Lab 09/29/18 1007  AST 24  ALT 21  ALKPHOS 72  BILITOT 0.6  PROT 5.3*  ALBUMIN 2.7*   No results for input(s): LIPASE, AMYLASE in the last 168 hours. No results for input(s): AMMONIA in the last 168 hours. CBC: Recent Labs  Lab 09/26/18 0511 09/28/18 0500 09/29/18 1007 09/30/18 1221  WBC 10.0 12.4* 10.6* 11.9*  NEUTROABS  --   --  7.2 9.5*  HGB 10.9* 10.6* 10.1* 9.5*  HCT 34.3* 33.8* 32.4* 30.0*  MCV 88.2 90.9 90.3 91.2  PLT 272 502* 399 452*   Cardiac Enzymes:   No results for input(s): CKTOTAL, CKMB, CKMBINDEX, TROPONINI in the last 168 hours. BNP (last 3 results) No results for input(s): BNP in the last 8760 hours.  ProBNP (last 3 results) No results for input(s): PROBNP in the last 8760 hours.  CBG: No results for input(s): GLUCAP in the last 168 hours.  Recent  Results (from the past 240 hour(s))  Culture, blood (Routine X 2) w Reflex to ID Panel     Status: None   Collection Time: 09/23/18  6:17 PM  Result Value Ref Range Status   Specimen Description BLOOD RIGHT ARM  Final   Special Requests   Final    BOTTLES DRAWN AEROBIC AND ANAEROBIC Blood Culture adequate volume Performed at [facility] [address], [place], [place] [address]    Culture NO GROWTH 5 DAYS  Final   Report Status 09/28/2018 FINAL  Final     Studies: No results found.  Scheduled Meds: . acyclovir  400 mg Oral BID  . allopurinol  300 mg Oral Daily  . amLODipine  5 mg Oral Daily  . benzonatate  200 mg Oral TID  . chlorpheniramine-HYDROcodone  5 mL Oral Q12H  . cholecalciferol  2,000 Units Oral Daily  . collagenase   Topical Daily  . enoxaparin (LOVENOX) injection  40 mg Subcutaneous Q24H  . famotidine  20 mg Oral Daily  . feeding supplement (ENSURE ENLIVE)  237 mL Oral BID BM  . fluticasone  2 spray Each Nare Daily  . folic acid  1 mg Oral Daily  . guaiFENesin  600  mg Oral BID  . lactobacillus acidophilus & bulgar  1 tablet Oral TID WC  . levalbuterol  0.63 mg Nebulization BID  . loratadine  10 mg Oral Daily  . mouth rinse  15 mL Mouth Rinse BID  . megestrol  400 mg Oral Daily  . mupirocin ointment   Topical BID  . pantoprazole  40 mg Oral BID  . predniSONE  60 mg Oral Q breakfast    Continuous Infusions: . sodium chloride Stopped (09/27/18 2114)  . sodium chloride Stopped (09/28/18 1533)     Time spent: 27mins I have personally reviewed and interpreted on  10/02/2018 daily labs,  imagings as discussed above under date review session and assessment and plans.  I reviewed all nursing notes, pharmacy notes, consultant notes,  vitals, pertinent old records  I have discussed plan of care as described above with RN , patient on 10/02/2018   Florencia Reasons MD, PhD  [facility] Pager [phone]. If 7PM-7AM, please contact  night-coverage at [email or web address], password [facility] 10/02/2018, 2:52 PM  LOS: 25 days

## 2018-10-03 LAB — CBC WITH DIFFERENTIAL/PLATELET
Abs Immature Granulocytes: 0.03 10*3/uL (ref 0.00–0.07)
Basophils Absolute: 0 10*3/uL (ref 0.0–0.1)
Basophils Relative: 0 %
EOS ABS: 0 10*3/uL (ref 0.0–0.5)
Eosinophils Relative: 0 %
HCT: 29.1 % — ABNORMAL LOW (ref 36.0–46.0)
Hemoglobin: 9.2 g/dL — ABNORMAL LOW (ref 12.0–15.0)
Immature Granulocytes: 1 %
Lymphocytes Relative: 1 %
Lymphs Abs: 0 10*3/uL — ABNORMAL LOW (ref 0.7–4.0)
MCH: 28.5 pg (ref 26.0–34.0)
MCHC: 31.6 g/dL (ref 30.0–36.0)
MCV: 90.1 fL (ref 80.0–100.0)
Monocytes Absolute: 0.1 10*3/uL (ref 0.1–1.0)
Monocytes Relative: 1 %
Neutro Abs: 5.6 10*3/uL (ref 1.7–7.7)
Neutrophils Relative %: 97 %
Platelets: 341 10*3/uL (ref 150–400)
RBC: 3.23 MIL/uL — ABNORMAL LOW (ref 3.87–5.11)
RDW: 17.2 % — ABNORMAL HIGH (ref 11.5–15.5)
WBC: 5.7 10*3/uL (ref 4.0–10.5)
nRBC: 0 % (ref 0.0–0.2)

## 2018-10-03 LAB — BASIC METABOLIC PANEL
Anion gap: 8 (ref 5–15)
BUN: 14 mg/dL (ref 8–23)
CO2: 23 mmol/L (ref 22–32)
Calcium: 7.9 mg/dL — ABNORMAL LOW (ref 8.9–10.3)
Chloride: 107 mmol/L (ref 98–111)
Creatinine, Ser: 0.3 mg/dL — ABNORMAL LOW (ref 0.44–1.00)
GFR calc non Af Amer: >60 mL/min (ref >60)
Glucose, Bld: 86 mg/dL (ref 70–99)
Potassium: 3.7 mmol/L (ref 3.5–5.1)
Sodium: 138 mmol/L (ref 135–145)

## 2018-10-03 LAB — MAGNESIUM: Magnesium: 2.1 mg/dL (ref 1.7–2.4)

## 2018-10-03 NOTE — Progress Notes (Signed)
Occupational Therapy Treatment Patient Details Name: Patricia Murillo MRN: [id number] DOB: 11/07/1945 Today's Date: 10/03/2018    History of present illness 72 y.o. 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  During her hospitalization she underwent left axillary lymph node biopsy. Pt admitted with LUE edema, weakness, dyspnea. 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    OT comments  OT session focused on lymphedema wrapping LUE  Follow Up Recommendations  SNF    Equipment Recommendations  None recommended by OT    Recommendations for Other Services      Precautions / Restrictions Precautions Precautions: Fall Precaution Comments: edema LUE              ADL either performed or assessed with clinical judgement   ADL                                         General ADL Comments: focus of session on compression wrapping.  Performed UE exercises prior to reapplying wrap               Cognition Arousal/Alertness: Awake/alert Behavior During Therapy: WFL for tasks assessed/performed Overall Cognitive Status: Within Functional Limits for tasks assessed                                                              Frequency  Min 2X/week        Progress Toward Goals  OT Goals(current goals can now be found in the care plan section)  Progress towards OT goals: Progressing toward goals      AM-PAC OT "6 Clicks" Daily Activity     Outcome Measure   Help from another person eating meals?: A Lot Help from another person taking care of personal grooming?: A Lot Help from another person toileting, which includes using toliet, bedpan, or urinal?: Total Help from another person bathing (including washing, rinsing, drying)?: Total Help from another person to put on and  taking off regular upper body clothing?: Total Help from another person to put on and taking off regular lower body clothing?: Total 6 Click Score: 8    End of Session    OT Visit Diagnosis: Unsteadiness on feet (R26.81);Muscle weakness (generalized) (M62.81)   Activity Tolerance Patient tolerated treatment well   Patient Left in bed;with call bell/phone within reach;with family/visitor present   Nurse Communication          Time: 0347-4259 OT Time Calculation (min): 32 min  Charges: OT General Charges $OT Visit: 1 Visit OT Treatments $Therapeutic Activity: 23-37 mins  Kari Baars, [place] [phone] Office- [phone]      Pammy Vesey, Edwena Felty D 10/03/2018, 1:50 PM

## 2018-10-03 NOTE — Progress Notes (Signed)
Patricia Murillo   DOB:06/14/1946   JG#:[id number]   MLY#:[id number]  Oncology f/u   Subjective: no event over the weekend, no fever, or other new symptoms.  Her left arm edema has much improved after wrapping. pendning SNF placement.   Objective:  Vitals:   10/03/18 1443 10/03/18 2006  BP: 140/68 110/60  Pulse: 88 74  Resp: 16 14  Temp: 97.7 F (36.5 C) 98 F (36.7 C)  SpO2: 95% 97%    Body mass index is 39.18 kg/m.  Intake/Output Summary (Last 24 hours) at 10/03/2018 2118 Last data filed at 10/03/2018 2014 Gross per 24 hour  Intake 720 ml  Output -  Net 720 ml     Sclerae unicteric  Oropharynx clear  Left axillary and up chest wall adenopathy has much improved  Lungs clear -- no rales or rhonchi  Heart regular rate and rhythm  Abdomen benign  MSK no focal spinal tenderness, no peripheral edema except mild edema in the left upper arm  Neuro nonfocal     CBG (last 3)  No results for input(s): GLUCAP in the last 72 hours.   Labs:  Lab Results  Component Value Date   WBC 5.7 10/03/2018   HGB 9.2 (L) 10/03/2018   HCT 29.1 (L) 10/03/2018   MCV 90.1 10/03/2018   PLT 341 10/03/2018   NEUTROABS 5.6 10/03/2018   CMP Latest Ref Rng & Units 10/03/2018 09/30/2018 09/29/2018  Glucose 70 - 99 mg/dL 86 107(H) 127(H)  BUN 8 - 23 mg/dL 14 13 8   Creatinine 0.44 - 1.00 mg/dL 0.30(L) 0.44 0.43(L)  Sodium 135 - 145 mmol/L 138 138 139  Potassium 3.5 - 5.1 mmol/L 3.7 3.9 3.4(L)  Chloride 98 - 111 mmol/L 107 108 108  CO2 22 - 32 mmol/L 23 22 23   Calcium 8.9 - 10.3 mg/dL 7.9(L) 7.6(L) 7.8(L)  Total Protein 6.5 - 8.1 g/dL - - 5.3(L)  Total Bilirubin 0.3 - 1.2 mg/dL - - 0.6  Alkaline Phos 38 - 126 U/L - - 72  AST 15 - 41 U/L - - 24  ALT 0 - 44 U/L - - 21     Urine Studies No results for input(s): UHGB, CRYS in the last 72 hours.  Invalid input(s): UACOL, UAPR, USPG, UPH, UTP, UGL, UKET, UBIL, UNIT, UROB, ULEU, UEPI, UWBC, URBC, UBAC, CAST, UCOM, [place]  Basic Metabolic  Panel: Recent Labs  Lab 09/28/18 0500 09/29/18 1007 09/30/18 1221 10/03/18 0615  NA 138 139 138 138  K 4.3 3.4* 3.9 3.7  CL 106 108 108 107  CO2 24 23 22 23   GLUCOSE 100* 127* 107* 86  BUN 10 8 13 14   CREATININE 0.38* 0.43* 0.44 0.30*  CALCIUM 8.0* 7.8* 7.6* 7.9*  MG  --   --   --  2.1   GFR Estimated Creatinine Clearance: 81.1 mL/min (A) (by C-G formula based on SCr of 0.3 mg/dL (L)). Liver Function Tests: Recent Labs  Lab 09/29/18 1007  AST 24  ALT 21  ALKPHOS 72  BILITOT 0.6  PROT 5.3*  ALBUMIN 2.7*   No results for input(s): LIPASE, AMYLASE in the last 168 hours. No results for input(s): AMMONIA in the last 168 hours. Coagulation profile No results for input(s): INR, PROTIME in the last 168 hours.  CBC: Recent Labs  Lab 09/28/18 0500 09/29/18 1007 09/30/18 1221 10/03/18 0615  WBC 12.4* 10.6* 11.9* 5.7  NEUTROABS  --  7.2 9.5* 5.6  HGB 10.6* 10.1* 9.5* 9.2*  HCT 33.8*  32.4* 30.0* 29.1*  MCV 90.9 90.3 91.2 90.1  PLT 502* 399 452* 341   Cardiac Enzymes: No results for input(s): CKTOTAL, CKMB, CKMBINDEX, TROPONINI in the last 168 hours. BNP: Invalid input(s): POCBNP CBG: No results for input(s): GLUCAP in the last 168 hours. D-Dimer No results for input(s): DDIMER in the last 72 hours. Hgb A1c No results for input(s): HGBA1C in the last 72 hours. Lipid Profile No results for input(s): CHOL, HDL, LDLCALC, TRIG, CHOLHDL, LDLDIRECT in the last 72 hours. Thyroid function studies No results for input(s): TSH, T4TOTAL, T3FREE, THYROIDAB in the last 72 hours.  Invalid input(s): FREET3 Anemia work up No results for input(s): VITAMINB12, FOLATE, FERRITIN, TIBC, IRON, RETICCTPCT in the last 72 hours. Microbiology No results found for this or any previous visit (from the past 240 hour(s)).    Studies:  No results found.  Assessment: 72 y.o. with newly diagnosed high-grade B-cell lymphoma, recently discharged from hospital after biopsy, presented to the  emergency room with progressive weakness, dyspnea, chills and fever, anorexia, and extremity edema.  1.  Pseudomonas UTI and bacteremia, resolved, repeated blood cultures (-)  2.  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 Hypoxic respiratory failure, improved, off oxygen now  4.  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 Hypercalcemia secondary to malignancy, s/p zometa 12/5, resolved now  7. Severe arm and leg edema, secondary to severe adenopathy and hypoalbuminemia, improving  8. Deconditioning  9 left ptosis and dilated right pupil  10.  Sacral pressure ulcers     Plan:  -lab reviewed, no need blood transfusion for now  -she has completed 5 days of prednisone 60mg , I will change it to 10mg  daily from tomorrow,and please taper if off in 2 weeks  -she is waiting for SNF placement  -I will set up neulasta as outpt as soon as she is discharged.  -if she is not able to be discharge in a few days, please start Granix 480mg  daily when ANC drops below 1.0K  -consider blood transfusion if Hg<8.0 -I will be out of office for the rest of this week, we will follow-up and set up her outpatient appointments.    Truitt Merle, MD 10/03/2018

## 2018-10-03 NOTE — Progress Notes (Signed)
Nutrition Follow-up  DOCUMENTATION CODES:   Non-severe (moderate) malnutrition in context of acute illness/injury  INTERVENTION:   Continue Ensure Enlive po BID, each supplement provides 350 kcal and 20 grams of protein  NUTRITION DIAGNOSIS:   Moderate Malnutrition related to acute illness, poor appetite as evidenced by percent weight loss, energy intake < or equal to 75% for > or equal to 1 month, mild muscle depletion. Ongoing.  GOAL:   Patient will meet greater than or equal to 90% of their needs  Progressing.  MONITOR:   PO intake, Supplement acceptance, Labs, Weight trends, I & O's  ASSESSMENT:   72 y.o. 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  During her hospitalization she underwent left axillary lymph node biopsy.  She was discharged home in a stable condition, but apparently she has been developing worsening weakness, generalized, worse with exertion, no improving factors, associated with malaise, chills and poor appetite.  Patient currently consuming 50-100% of meals over the past 48 hours. Pt is drinking ~1 Ensure daily.  Per weight records, weight has increased +1 lb since 12/23.   Labs reviewed. Medications: Vitamin D tablet daily, folic acid tablet daily, Megace suspension daily  Diet Order:   Diet Order            Diet regular Room service appropriate? Yes; Fluid consistency: Thin  Diet effective now              EDUCATION NEEDS:   Education needs have been addressed  Skin:  Skin Assessment: Reviewed RN Assessment Skin Integrity Issues:: Other (Comment), Unstageable Unstageable: sacrum Other: left labia  Last BM:  12/30  Height:   Ht Readings from Last 1 Encounters:  09/08/18 5\' 6"  (1.676 m)    Weight:   Wt Readings from Last 1 Encounters:  10/01/18 110.1 kg    Ideal Body Weight:  59.1  kg  BMI:  Body mass index is 39.18 kg/m.  Estimated Nutritional Needs:   Kcal:  1700-1900  Protein:  90-100g  Fluid:  1.7L/day  Clayton Bibles, MS, RD, LDN [place] [facility] Pager: [phone] After Hours Pager: [phone]

## 2018-10-03 NOTE — Care Management Important Message (Signed)
Important Message  Patient Details  Name: Patricia Murillo MRN: [id number] Date of Birth: 02/01/1946   Medicare Important Message Given:  Yes    Kerin Salen 10/03/2018, 10:06 AMImportant Message  Patient Details  Name: Patricia Murillo MRN: [id number] Date of Birth: 1946-03-20   Medicare Important Message Given:  Yes    Kerin Salen 10/03/2018, 10:05 AM

## 2018-10-03 NOTE — Progress Notes (Signed)
PROGRESS NOTE  Patricia Murillo WNI:[id number] DOB: 10/01/1946 DOA: 09/07/2018 PCP: Patient, No Pcp Per  HPI/Recap of past 24 hours:  No acute interval changes, no fever, on room air at rest, she does has intermittent congested cough Tendency to lean towards left Left arm edema has much improved  She reports left lower leg pain when bearing weight, non tender on exam She wants to work with physical therapy in the morning  Daughter at bedside   Assessment/Plan: Active Problems:   Respiratory failure (HCC)   Lymphoma ([place])   Sepsis ([place])   High grade B-cell lymphoma ([place])   Malnutrition of moderate degree   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 (HCC)   Hypercalcemia   Hypernatremia   Aspiration into airway   Hypoxia   Neutropenic fever (HCC)   Pressure injury of skin   Bacteremia due to Pseudomonas   Constipation   FTT (failure to thrive) in adult  Neutropenic Fevers/Pseudomonas Aeruginosa Bacteremia/Pseudomonas UTI--clinically much improved, currently afebrile, WBC  12.9, Granix discontinued ,  blood cultures from 09/21/2018 with pseudomonas aeruginosa, urine culture also with Pseudomonas aeruginosa AND enterococcus "faecalis",, chest x-ray without acute findings, treated with IV cefepime started 09/21/2018 thru 09/24/18, verbal consultation on 09/22/18 with Dr Talbot Grumbling and ID Pharmacist , verbal consultation with Dr Michel Bickers on 09/24/18, stopped cefepime, start IV Levaquin 750 daily for 5 days starting on 09/24/2018 (Last dose 09/28/18) to cover for both Pseudomonas and enterococcus based on urine culture  Neutropenia resolved  Diffuse large B-cell lymphoma, activated B-cell type--- per Dr Burr Medico R-CHOP should be continued once acute issues resolve, next dose 09/30/18, continue prednisone  Getting second round of chemotherapy. Outpatient Neulasta. CBC every 2 days.  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 Burr Medico  Appreciated  Hypercalcemia secondary to malignancy- s/p Zometa  Acute hypoxic respiratory failure. Likely related to progressive high-grade B-cell lymphoma with pulmonary involvement comp located by suspected aspiration--- respiratory status appears back to baseline--- continue bronchodilators, IV Levaquin as above #1 (last dose 09/28/18) Continue supportive measures for cough suppression.  Acute toxic and metabolic encephalopathy----mentation is back to baseline as per family, it was multi-factorial including malignancy, encephalopathy has now resolved , hypercalcemia and obviously infection,   Moderate protein caloric malnutrition----due to underlying malignancy and poor oral intake, patient has low albumin and edema--- nutritional supplements advised, c/n Megace for appetite stimulation  Significant left upper extremity swelling and weakness----due to lymphatic obstruction by high-grade lymphoma, venous Dopplers negative of the DVT on 08/29/2018 Lymphedema wrap.  unstageable pressure injury in the sacrococcygeal area- 3.5cm x 1.5cm x 0cm  wound care last seen on 12/26, reeval requested,    subconjunctival hemorrhage. Left eye subconjunctival hemorrhage likely secondary to coughing.  Monitor.  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 in July 2019  FTT; SNF placement  Code Status: full,  Verified with patient  Family Communication: patient and daughter at bedside  Disposition Plan: SNF when bed is available, hopefully tomorrow   Consultants:  oncology  Procedures:  As above  Antibiotics:  As above   Objective: BP 110/60 (BP  Location: Right Wrist)   Pulse 75   Temp 98.1 F (36.7 C) (Oral)  Resp 16   Ht 5\' 6"  (1.676 m)   Wt 110.1 kg   SpO2 96%   BMI 39.18 kg/m   Intake/Output Summary (Last 24 hours) at 10/03/2018 1433 Last data filed at 10/03/2018 1021 Gross per 24 hour  Intake 480 ml  Output -  Net 480 ml   Filed Weights   09/26/18 0500 09/27/18 0611 10/01/18 0500  Weight: 109.3 kg 110.2 kg 110.1 kg    Exam: Patient is examined daily including today on 10/03/2018, exams remain the same as of yesterday except that has changed    General:  NAD, picc line on right arm  Cardiovascular: paced rhytym  Respiratory: diminished at basis, no wheezing, no rales , no rhonchi  Abdomen: Soft/ND/NT, positive BS  Musculoskeletal:  left arm wrapped for chronic edema, edema has much improved  Neuro: alert, oriented   Data Reviewed: Basic Metabolic Panel: Recent Labs  Lab 09/28/18 0500 09/29/18 1007 09/30/18 1221 10/03/18 0615  NA 138 139 138 138  K 4.3 3.4* 3.9 3.7  CL 106 108 108 107  CO2 24 23 22 23   GLUCOSE 100* 127* 107* 86  BUN 10 8 13 14   CREATININE 0.38* 0.43* 0.44 0.30*  CALCIUM 8.0* 7.8* 7.6* 7.9*  MG  --   --   --  2.1   Liver Function Tests: Recent Labs  Lab 09/29/18 1007  AST 24  ALT 21  ALKPHOS 72  BILITOT 0.6  PROT 5.3*  ALBUMIN 2.7*   No results for input(s): LIPASE, AMYLASE in the last 168 hours. No results for input(s): AMMONIA in the last 168 hours. CBC: Recent Labs  Lab 09/28/18 0500 09/29/18 1007 09/30/18 1221 10/03/18 0615  WBC 12.4* 10.6* 11.9* 5.7  NEUTROABS  --  7.2 9.5* 5.6  HGB 10.6* 10.1* 9.5* 9.2*  HCT 33.8* 32.4* 30.0* 29.1*  MCV 90.9 90.3 91.2 90.1  PLT 502* 399 452* 341   Cardiac Enzymes:   No results for input(s): CKTOTAL, CKMB, CKMBINDEX, TROPONINI in the last 168 hours. BNP (last 3 results) No results for input(s): BNP in the last 8760 hours.  ProBNP (last 3 results) No results for input(s): PROBNP in the last 8760  hours.  CBG: No results for input(s): GLUCAP in the last 168 hours.  Recent Results (from the past 240 hour(s))  Culture, blood (Routine X 2) w Reflex to ID Panel     Status: None   Collection Time: 09/23/18  6:17 PM  Result Value Ref Range Status   Specimen Description BLOOD RIGHT ARM  Final   Special Requests   Final    BOTTLES DRAWN AEROBIC AND ANAEROBIC Blood Culture adequate volume Performed at [facility] [address], [place], [place] [address]    Culture NO GROWTH 5 DAYS  Final   Report Status 09/28/2018 FINAL  Final     Studies: No results found.  Scheduled Meds: . acyclovir  400 mg Oral BID  . allopurinol  300 mg Oral Daily  . amLODipine  5 mg Oral Daily  . benzonatate  200 mg Oral TID  . chlorpheniramine-HYDROcodone  5 mL Oral Q12H  . cholecalciferol  2,000 Units Oral Daily  . collagenase   Topical Daily  . enoxaparin (LOVENOX) injection  40 mg Subcutaneous Q24H  . famotidine  20 mg Oral Daily  . feeding supplement (ENSURE ENLIVE)  237 mL Oral BID BM  . fluticasone  2 spray Each Nare Daily  . folic acid  1 mg Oral Daily  .  guaiFENesin  600 mg Oral BID  . lactobacillus acidophilus & bulgar  1 tablet Oral TID WC  . levalbuterol  0.63 mg Nebulization BID  . loratadine  10 mg Oral Daily  . mouth rinse  15 mL Mouth Rinse BID  . megestrol  400 mg Oral Daily  . mupirocin ointment   Topical BID  . pantoprazole  40 mg Oral BID  . predniSONE  60 mg Oral Q breakfast    Continuous Infusions: . sodium chloride Stopped (09/27/18 2114)  . sodium chloride Stopped (09/28/18 1533)     Time spent: 26mins, case discussed with oncology Dr Burr Medico over the phone I have personally reviewed and interpreted on  10/03/2018 daily labs,  imagings as discussed above under date review session and assessment and plans.  I reviewed all nursing notes, pharmacy notes, consultant notes,  vitals, pertinent old records  I have discussed plan of care as described  above with RN , patient and daughter on 10/03/2018   Florencia Reasons MD, PhD  [facility] Pager [phone]. If 7PM-7AM, please contact night-coverage at [email or web address], password [facility] 10/03/2018, 2:33 PM  LOS: 26 days

## 2018-10-03 NOTE — Progress Notes (Signed)
Physical Therapy Treatment Patient Details Name: Patricia Murillo MRN: [id number] DOB: [birth year] Today's Date: 10/03/2018    History of Present Illness 72 y.o. 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  During her hospitalization she underwent left axillary lymph node biopsy. Pt admitted with LUE edema, weakness, dyspnea. 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     PT Comments    Pt's bed mobility has greatly improved.  Pt attempted to stand however unable to achieve full erect posture.  Pt declined attempting again and wished to return to bed reporting increased fatigue today.  Pt requests therapy to come in the morning (when she is not as tired from the day).  Continue to recommend SNF upon d/c.   Follow Up Recommendations  SNF     Equipment Recommendations  Rolling walker with 5" wheels;3in1 (PT)    Recommendations for Other Services       Precautions / Restrictions Precautions Precautions: Fall Precaution Comments: edema L UE - wrapping per OT    Mobility  Bed Mobility Overal bed mobility: Needs Assistance Bed Mobility: Supine to Sit;Sit to Supine Rolling: Supervision   Supine to sit: Supervision Sit to supine: Mod assist   General bed mobility comments: assist for LEs onto bed end of session due to reported fatigue and pain  Transfers Overall transfer level: Needs assistance Equipment used: Rolling walker (2 wheeled) Transfers: Sit to/from Stand Sit to Stand: From elevated surface;+2 physical assistance;Max assist         General transfer comment: pt attempting to assist initially however unable to stand completely erect and returned to sitting, pt declined another attempt despite encouragement (even if bed raised, use stedy)  Pt appears anxious for mobility however denies this and instead reports being  too fatigued this afternoon to participate  Ambulation/Gait                 Stairs             Wheelchair Mobility    Modified Rankin (Stroke Patients Only)       Balance Overall balance assessment: Needs assistance Sitting-balance support: No upper extremity supported;Feet supported Sitting balance-Leahy Scale: Fair     Standing balance support: Bilateral upper extremity supported Standing balance-Leahy Scale: Zero                              Cognition Arousal/Alertness: Awake/alert Behavior During Therapy: WFL for tasks assessed/performed Overall Cognitive Status: Within Functional Limits for tasks assessed                                        Exercises      General Comments        Pertinent Vitals/Pain Pain Assessment: Faces Faces Pain Scale: Hurts little more Pain Location: L LE (pt reports L LE pain today, daughter reports this is new) Pain Descriptors / Indicators: Sore;Tender Pain Intervention(s): Monitored during session;Repositioned    Home Living                      Prior Function            PT Goals (current goals can now be found in the care plan section)  Acute Rehab PT Goals PT Goal Formulation: With patient/family Time For Goal Achievement: 10/17/18 Potential to Achieve Goals: Fair Progress towards PT goals: Progressing toward goals    Frequency    Min 2X/week      PT Plan Current plan remains appropriate    Co-evaluation              AM-PAC PT "6 Clicks" Mobility   Outcome Measure  Help needed turning from your back to your side while in a flat bed without using bedrails?: A Little Help needed moving from lying on your back to sitting on the side of a flat bed without using bedrails?: A Little Help needed moving to and from a bed to a chair (including a wheelchair)?: Total Help needed standing up from a chair using your arms (e.g., wheelchair or bedside chair)?:  Total Help needed to walk in hospital room?: Total Help needed climbing 3-5 steps with a railing? : Total 6 Click Score: 10    End of Session Equipment Utilized During Treatment: (pt would not allow gait belt today) Activity Tolerance: Patient limited by fatigue;Patient limited by pain Patient left: in bed;with call bell/phone within reach;with family/visitor present   PT Visit Diagnosis: Other abnormalities of gait and mobility (R26.89);Muscle weakness (generalized) (M62.81)     Time: 1420-1440 PT Time Calculation (min) (ACUTE ONLY): 20 min  Charges:  $Therapeutic Activity: 8-22 mins                    Carmelia Bake, PT, DPT Acute Rehabilitation Services Office: [phone] Pager: [phone]  Trena Platt 10/03/2018, 3:46 PM

## 2018-10-04 NOTE — Progress Notes (Addendum)
OT treatment note; goals updated today  10/04/18 1500  OT Visit Information  Last OT Received On 10/04/18  Assistance Needed +2  History of Present Illness 72 y.o. 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  During her hospitalization she underwent left axillary lymph node biopsy. Pt admitted with LUE edema, weakness, dyspnea. 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   Precautions  Precautions Fall  Pain Assessment  Pain Score 0  Cognition  Arousal/Alertness Awake/alert  Behavior During Therapy WFL for tasks assessed/performed  Overall Cognitive Status Within Functional Limits for tasks assessed  General Comments  General comments (skin integrity, edema, etc.) lymphedema wrapping:  Pt seen in 2 parts today:  [id number], removed lymphedema wrap, performed washing/lotion, and took measurements as follows:    ACROSS MCPS (2-5 DIGITS, ADDUCTED) 20 CM  JUST DISTAL TO ULNAR STYLOID 19 cm  15 CM PROX TO ULNAR STYLOID 26 cm  AT OLECRANON 27 cm  15 CM PROX TO OLECRANON 35 cm  Touched base with SW about D/C; unsure if it will be today.  Called and left a message for [facility] to see if they do lymphedema wrapping:  No response back  Gave pt resources for compression garments and OP lymphedema program.  Did rewrap pt [phone] with son present.  Family has not had the opportunity to wrap, and this needs to be done consistently to maintain gains.    Talked to RN and posted note for wraps to be removed prior to d/c to SNF and all materials saved (bag provided)   Other Exercises  Other Exercises A/AA ROM SHOULDER FLEXION, FINGERS, AND ENCOURAGED DEEP BREATHING  OT - End of Session  Activity Tolerance Patient tolerated treatment well  Patient left in bed;with call bell/phone within reach;with family/visitor present  OT  Assessment/Plan  OT Visit Diagnosis Unsteadiness on feet (R26.81);Muscle weakness (generalized) (M62.81)  OT Frequency (ACUTE ONLY) Min 2X/week  Follow Up Recommendations SNF  OT Equipment None recommended by OT  AM-PAC OT "6 Clicks" Daily Activity Outcome Measure (Version 2)  Help from another person eating meals? 2  Help from another person taking care of personal grooming? 2  Help from another person toileting, which includes using toliet, bedpan, or urinal? 2  Help from another person bathing (including washing, rinsing, drying)? 2  Help from another person to put on and taking off regular upper body clothing? 2  Help from another person to put on and taking off regular lower body clothing? 1  6 Click Score 11  OT Goal Progression  Progress towards OT goals Progressing toward goals  Acute Rehab OT Goals  Time For Goal Achievement 10/18/18  OT Time Calculation  OT Start Time (ACUTE ONLY) 1300  OT Stop Time (ACUTE ONLY) 1342  OT Time Calculation (min) 42 min  OT General Charges  $OT Visit 1 Visit  OT Treatments  $Therapeutic Activity 38-52 mins  Lesle Chris, OTR/L Acute Rehabilitation Services [phone] [facility] pager [phone] office 10/04/2018

## 2018-10-04 NOTE — Progress Notes (Signed)
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)   Hypercalcemia   Hypernatremia   Aspiration into airway   Hypoxia   Neutropenic fever (HCC)   Pressure injury of skin   Bacteremia due to Pseudomonas   Constipation   FTT (failure to thrive) in adult  Neutropenic Fevers/Pseudomonas Aeruginosa Bacteremia/Pseudomonas UTI--clinically much improved, currently afebrile, WBC  12.9, Granix discontinued ,  blood cultures from 09/21/2018 with pseudomonas aeruginosa, urine culture also with Pseudomonas aeruginosa AND enterococcus "faecalis",, chest x-ray without acute findings, treated with IV cefepime started 09/21/2018 thru 09/24/18, verbal consultation on 09/22/18 with Dr Talbot Grumbling and ID Pharmacist , verbal consultation with Dr Michel Bickers on 09/24/18, stopped cefepime, start IV Levaquin 750 daily for 5 days starting on 09/24/2018 (Last dose 09/28/18) to cover for both Pseudomonas and enterococcus based on urine culture  Neutropenia resolved  Diffuse large B-cell lymphoma, activated B-cell type--- per Dr Burr Medico R-CHOP should be continued once acute issues resolve, next dose 09/30/18, continue prednisone  Getting second round of chemotherapy. Outpatient Neulasta. CBC every 2 days.  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 Burr Medico  Appreciated  Hypercalcemia secondary to malignancy- s/p Zometa  Acute hypoxic respiratory failure. Likely related to progressive high-grade B-cell lymphoma with pulmonary involvement comp located by suspected aspiration--- respiratory status appears back to baseline--- continue bronchodilators, IV Levaquin as above #1 (last dose 09/28/18) Continue supportive measures for cough suppression.  Acute toxic and metabolic encephalopathy----mentation is back to baseline as per family, it was multi-factorial including malignancy, encephalopathy has now resolved , hypercalcemia and obviously infection,   Moderate protein caloric malnutrition----due to underlying malignancy and poor oral intake, patient has low albumin and edema--- nutritional supplements advised, c/n Megace for appetite stimulation  Significant left upper extremity swelling and weakness----due to lymphatic obstruction by high-grade lymphoma, venous Dopplers negative of the DVT on 08/29/2018 Lymphedema wrap.  unstageable pressure injury in the sacrococcygeal area- 3.5cm x 1.5cm x 0cm  wound care last seen on 12/31,  "Wound cleansed with betadine swabstick x2 and allowed to dry. Soft, necrotic tissue from center of wound debrided using CSWD technique. No bleeding, no discomforted expressed by patient.  Continue wound care daily with enzymatic debridement agent, collagenase (Santyl) as directed."   subconjunctival hemorrhage. Left eye subconjunctival hemorrhage likely secondary to coughing.  Monitor.  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 in July 2019  FTT; SNF placement  Code Status: full,  Verified with patient  Family Communication: patient and son  at bedside  Disposition Plan: SNF ,  hopefully tomorrow ( awaiting for insurance  approval)   Consultants:  Oncology Dr Burr Medico  Procedures:  Bedside wound debridement by wound  care RN  Antibiotics:  As above   Objective: BP (!) 161/72   Pulse 90   Temp 97.7 F (36.5 C) (Oral)   Resp 13   Ht 5\' 6"  (1.676 m)   Wt 110.1 kg   SpO2 96%   BMI 39.18 kg/m   Intake/Output Summary (Last 24 hours) at 10/04/2018 1745 Last data filed at 10/04/2018 0426 Gross per 24 hour  Intake 475 ml  Output -  Net 475 ml   Filed Weights   09/26/18 0500 09/27/18 0611 10/01/18 0500  Weight: 109.3 kg 110.2 kg 110.1 kg    Exam: Patient is examined daily including today on 10/04/2018, exams remain the same as of yesterday except that has changed    General:  NAD, picc line on right arm  Cardiovascular: paced rhytym  Respiratory: diminished at basis, no wheezing, no rales , no rhonchi  Abdomen: Soft/ND/NT, positive BS  Musculoskeletal:  left arm wrapped for chronic edema, edema has much improved  Neuro: alert, oriented   Data Reviewed: Basic Metabolic Panel: Recent Labs  Lab 09/28/18 0500 09/29/18 1007 09/30/18 1221 10/03/18 0615  NA 138 139 138 138  K 4.3 3.4* 3.9 3.7  CL 106 108 108 107  CO2 24 23 22 23   GLUCOSE 100* 127* 107* 86  BUN 10 8 13 14   CREATININE 0.38* 0.43* 0.44 0.30*  CALCIUM 8.0* 7.8* 7.6* 7.9*  MG  --   --   --  2.1   Liver Function Tests: Recent Labs  Lab 09/29/18 1007  AST 24  ALT 21  ALKPHOS 72  BILITOT 0.6  PROT 5.3*  ALBUMIN 2.7*   No results for input(s): LIPASE, AMYLASE in the last 168 hours. No results for input(s): AMMONIA in the last 168 hours. CBC: Recent Labs  Lab 09/28/18 0500 09/29/18 1007 09/30/18 1221 10/03/18 0615  WBC 12.4* 10.6* 11.9* 5.7  NEUTROABS  --  7.2 9.5* 5.6  HGB 10.6* 10.1* 9.5* 9.2*  HCT 33.8* 32.4* 30.0* 29.1*  MCV 90.9 90.3 91.2 90.1  PLT 502* 399 452* 341   Cardiac Enzymes:   No results for input(s): CKTOTAL, CKMB, CKMBINDEX, TROPONINI in the last 168 hours. BNP (last 3  results) No results for input(s): BNP in the last 8760 hours.  ProBNP (last 3 results) No results for input(s): PROBNP in the last 8760 hours.  CBG: No results for input(s): GLUCAP in the last 168 hours.  No results found for this or any previous visit (from the past 240 hour(s)).   Studies: No results found.  Scheduled Meds: . acyclovir  400 mg Oral BID  . allopurinol  300 mg Oral Daily  . amLODipine  5 mg Oral Daily  . benzonatate  200 mg Oral TID  . chlorpheniramine-HYDROcodone  5 mL Oral Q12H  . cholecalciferol  2,000 Units Oral Daily  . collagenase   Topical Daily  . enoxaparin (LOVENOX) injection  40 mg Subcutaneous Q24H  . famotidine  20 mg Oral Daily  . feeding supplement (ENSURE ENLIVE)  237 mL Oral BID BM  . fluticasone  2 spray Each Nare Daily  . folic acid  1 mg Oral Daily  . guaiFENesin  600 mg Oral BID  . lactobacillus acidophilus & bulgar  1 tablet Oral TID WC  . levalbuterol  0.63 mg Nebulization BID  . loratadine  10 mg Oral Daily  . mouth rinse  15 mL Mouth Rinse BID  . megestrol  400 mg Oral Daily  . mupirocin ointment  Topical BID  . pantoprazole  40 mg Oral BID  . predniSONE  60 mg Oral Q breakfast    Continuous Infusions: . sodium chloride Stopped (09/27/18 2114)  . sodium chloride Stopped (09/28/18 1533)     Time spent: 18mins,  I have personally reviewed and interpreted on  10/04/2018 daily labs,  imagings as discussed above under date review session and assessment and plans.  I reviewed all nursing notes, pharmacy notes, consultant notes,  vitals, pertinent old records  I have discussed plan of care as described above with RN , patient and son on 10/04/2018   Florencia Reasons MD, PhD  [facility] Pager [phone]. If 7PM-7AM, please contact night-coverage at [email or web address], password [facility] 10/04/2018, 5:45 PM  LOS: 27 days

## 2018-10-04 NOTE — Consult Note (Signed)
[facility] Nurse wound follow up Wound type: Pressure Injury, Unstageable Measurement:3cm x 3.5cm with depth unable to be determined due to the presence of yellow necrotic slough obscuring wound bed Wound bed:As described above Drainage (amount, consistency, odor) Light yellow drainage on old dressing consistent with enzymatically debriding necrotic slough and eschar Periwound: intact, mild erythema Dressing procedure/placement/frequency: Conservative sharp wound debridement (CSWD performed at the bedside): Wound cleansed with betadine swabstick x2 and allowed to dry. Soft, necrotic tissue from center of wound debrided using CSWD technique. No bleeding, no discomforted expressed by patient.  Continue wound care daily with enzymatic debridement agent, collagenase (Santyl) as directed.  [place] nursing team will follow, and will remain available to this patient, the nursing and medical teams while in house.  Thanks, Maudie Flakes, MSN, RN, [place], Arther Abbott  Pager# [phone]

## 2018-10-04 NOTE — Progress Notes (Signed)
Physical Therapy Treatment Patient Details Name: Patricia Murillo MRN: [id number] DOB: [birth year] Today's Date: 10/04/2018    History of Present Illness 72 y.o. 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  During her hospitalization she underwent left axillary lymph node biopsy. Pt admitted with LUE edema, weakness, dyspnea. 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     PT Comments    Pt sitting Indep EOB with son Tim in room.  Pt required + 2 side by side hand held assist to amb a very limited distance to bathroom.  VERY weak.  B knees buckle.  Unable to use a walker due to L UE Lymphedema Wrap.  Assisted with hygiene as pt briefly stood.  Limited starength.  Assisted back to bed per wound care RN to change dressing. Pt will need ST Rehab at SNF prior to returning home.    Follow Up Recommendations  SNF     Equipment Recommendations  Rolling walker with 5" wheels;3in1 (PT)    Recommendations for Other Services       Precautions / Restrictions Precautions Precautions: Fall Precaution Comments: edema L UE - wrapping per OT Restrictions Weight Bearing Restrictions: No    Mobility  Bed Mobility Overal bed mobility: Needs Assistance Bed Mobility: Supine to Sit;Sit to Supine     Supine to sit: Supervision Sit to supine: Mod assist   General bed mobility comments: pt partially seated EOB on arrival with son in room  Then assisted back to bed per Wound Care RN ro change dressing.   Transfers Overall transfer level: Needs assistance Equipment used: None Transfers: Sit to/from [facility]   Stand pivot transfers: Mod assist;Max assist;+2 physical assistance;+2 safety/equipment;From elevated surface       General transfer comment: pt required + 2 side by side assist underarm as pt is unable to use walker (L  UE lymp wrap) assisted from elevated bed as well as on/off toilet.    Ambulation/Gait   Gait Distance (Feet): 10 Feet(6 feet to bathroom then another 4 feet back to bed )             Stairs             Wheelchair Mobility    Modified Rankin (Stroke Patients Only)       Balance                                            Cognition Arousal/Alertness: Awake/alert Behavior During Therapy: WFL for tasks assessed/performed Overall Cognitive Status: Within Functional Limits for tasks assessed                                 General Comments: required MAX encouragement and present with anxiety/fear of falling      Exercises      General Comments        Pertinent Vitals/Pain Pain Assessment: Faces Faces Pain Scale: Hurts a little bit Pain Location: general Pain Descriptors / Indicators: Grimacing Pain Intervention(s): Monitored during session    Home Living                      Prior Function  PT Goals (current goals can now be found in the care plan section) Progress towards PT goals: Progressing toward goals    Frequency    Min 2X/week      PT Plan Current plan remains appropriate    Co-evaluation              AM-PAC PT "6 Clicks" Mobility   Outcome Measure  Help needed turning from your back to your side while in a flat bed without using bedrails?: A Little Help needed moving from lying on your back to sitting on the side of a flat bed without using bedrails?: A Little Help needed moving to and from a bed to a chair (including a wheelchair)?: Total Help needed standing up from a chair using your arms (e.g., wheelchair or bedside chair)?: Total Help needed to walk in hospital room?: Total Help needed climbing 3-5 steps with a railing? : Total 6 Click Score: 10    End of Session Equipment Utilized During Treatment: Gait belt Activity Tolerance: Patient limited by fatigue;Other  (comment)(anxiety/fear of falling) Patient left: in bed;with call bell/phone within reach;with family/visitor present Nurse Communication: Need for lift equipment;Mobility status PT Visit Diagnosis: Other abnormalities of gait and mobility (R26.89);Muscle weakness (generalized) (M62.81)     Time: 1005-1030 PT Time Calculation (min) (ACUTE ONLY): 25 min  Charges:  $Gait Training: 8-22 mins $Therapeutic Activity: 8-22 mins                     Rica Koyanagi  PTA Acute  Rehabilitation Services Pager      [phone] Office      [phone]

## 2018-10-05 DIAGNOSIS — R6521 Severe sepsis with septic shock: Secondary | ICD-10-CM | POA: Diagnosis not present

## 2018-10-05 DIAGNOSIS — Z950 Presence of cardiac pacemaker: Secondary | ICD-10-CM | POA: Diagnosis not present

## 2018-10-05 DIAGNOSIS — M1990 Unspecified osteoarthritis, unspecified site: Secondary | ICD-10-CM | POA: Diagnosis present

## 2018-10-05 DIAGNOSIS — R278 Other lack of coordination: Secondary | ICD-10-CM | POA: Diagnosis not present

## 2018-10-05 DIAGNOSIS — C8510 Unspecified B-cell lymphoma, unspecified site: Secondary | ICD-10-CM | POA: Diagnosis not present

## 2018-10-05 DIAGNOSIS — D594 Other nonautoimmune hemolytic anemias: Secondary | ICD-10-CM | POA: Diagnosis not present

## 2018-10-05 DIAGNOSIS — C8338 Diffuse large B-cell lymphoma, lymph nodes of multiple sites: Secondary | ICD-10-CM | POA: Diagnosis not present

## 2018-10-05 DIAGNOSIS — D61810 Antineoplastic chemotherapy induced pancytopenia: Secondary | ICD-10-CM | POA: Diagnosis not present

## 2018-10-05 DIAGNOSIS — D649 Anemia, unspecified: Secondary | ICD-10-CM | POA: Diagnosis not present

## 2018-10-05 DIAGNOSIS — C8590 Non-Hodgkin lymphoma, unspecified, unspecified site: Secondary | ICD-10-CM | POA: Diagnosis not present

## 2018-10-05 DIAGNOSIS — M109 Gout, unspecified: Secondary | ICD-10-CM | POA: Diagnosis not present

## 2018-10-05 DIAGNOSIS — L89150 Pressure ulcer of sacral region, unstageable: Secondary | ICD-10-CM | POA: Diagnosis not present

## 2018-10-05 DIAGNOSIS — K219 Gastro-esophageal reflux disease without esophagitis: Secondary | ICD-10-CM | POA: Diagnosis present

## 2018-10-05 DIAGNOSIS — C8330 Diffuse large B-cell lymphoma, unspecified site: Secondary | ICD-10-CM | POA: Diagnosis not present

## 2018-10-05 DIAGNOSIS — R279 Unspecified lack of coordination: Secondary | ICD-10-CM | POA: Diagnosis not present

## 2018-10-05 DIAGNOSIS — J9601 Acute respiratory failure with hypoxia: Secondary | ICD-10-CM | POA: Diagnosis not present

## 2018-10-05 DIAGNOSIS — D591 Other autoimmune hemolytic anemias: Secondary | ICD-10-CM | POA: Diagnosis not present

## 2018-10-05 DIAGNOSIS — A4152 Sepsis due to Pseudomonas: Secondary | ICD-10-CM | POA: Diagnosis not present

## 2018-10-05 DIAGNOSIS — E1165 Type 2 diabetes mellitus with hyperglycemia: Secondary | ICD-10-CM | POA: Diagnosis not present

## 2018-10-05 DIAGNOSIS — R0689 Other abnormalities of breathing: Secondary | ICD-10-CM | POA: Diagnosis not present

## 2018-10-05 DIAGNOSIS — K449 Diaphragmatic hernia without obstruction or gangrene: Secondary | ICD-10-CM | POA: Diagnosis not present

## 2018-10-05 DIAGNOSIS — Z8261 Family history of arthritis: Secondary | ICD-10-CM | POA: Diagnosis not present

## 2018-10-05 DIAGNOSIS — D61818 Other pancytopenia: Secondary | ICD-10-CM | POA: Diagnosis not present

## 2018-10-05 DIAGNOSIS — D708 Other neutropenia: Secondary | ICD-10-CM | POA: Diagnosis not present

## 2018-10-05 DIAGNOSIS — J9600 Acute respiratory failure, unspecified whether with hypoxia or hypercapnia: Secondary | ICD-10-CM | POA: Diagnosis not present

## 2018-10-05 DIAGNOSIS — B965 Pseudomonas (aeruginosa) (mallei) (pseudomallei) as the cause of diseases classified elsewhere: Secondary | ICD-10-CM | POA: Diagnosis not present

## 2018-10-05 DIAGNOSIS — R509 Fever, unspecified: Secondary | ICD-10-CM | POA: Diagnosis not present

## 2018-10-05 DIAGNOSIS — D701 Agranulocytosis secondary to cancer chemotherapy: Secondary | ICD-10-CM | POA: Diagnosis not present

## 2018-10-05 DIAGNOSIS — E870 Hyperosmolality and hypernatremia: Secondary | ICD-10-CM | POA: Diagnosis not present

## 2018-10-05 DIAGNOSIS — K922 Gastrointestinal hemorrhage, unspecified: Secondary | ICD-10-CM | POA: Diagnosis not present

## 2018-10-05 DIAGNOSIS — D696 Thrombocytopenia, unspecified: Secondary | ICD-10-CM | POA: Diagnosis not present

## 2018-10-05 DIAGNOSIS — Z515 Encounter for palliative care: Secondary | ICD-10-CM | POA: Diagnosis not present

## 2018-10-05 DIAGNOSIS — E785 Hyperlipidemia, unspecified: Secondary | ICD-10-CM | POA: Diagnosis present

## 2018-10-05 DIAGNOSIS — I427 Cardiomyopathy due to drug and external agent: Secondary | ICD-10-CM | POA: Diagnosis present

## 2018-10-05 DIAGNOSIS — Z8249 Family history of ischemic heart disease and other diseases of the circulatory system: Secondary | ICD-10-CM | POA: Diagnosis not present

## 2018-10-05 DIAGNOSIS — C8332 Diffuse large B-cell lymphoma, intrathoracic lymph nodes: Secondary | ICD-10-CM | POA: Diagnosis not present

## 2018-10-05 DIAGNOSIS — Z5112 Encounter for antineoplastic immunotherapy: Secondary | ICD-10-CM | POA: Diagnosis not present

## 2018-10-05 DIAGNOSIS — R1084 Generalized abdominal pain: Secondary | ICD-10-CM | POA: Diagnosis not present

## 2018-10-05 DIAGNOSIS — Y848 Other medical procedures as the cause of abnormal reaction of the patient, or of later complication, without mention of misadventure at the time of the procedure: Secondary | ICD-10-CM | POA: Diagnosis not present

## 2018-10-05 DIAGNOSIS — E440 Moderate protein-calorie malnutrition: Secondary | ICD-10-CM | POA: Diagnosis not present

## 2018-10-05 DIAGNOSIS — Z79899 Other long term (current) drug therapy: Secondary | ICD-10-CM | POA: Diagnosis not present

## 2018-10-05 DIAGNOSIS — N390 Urinary tract infection, site not specified: Secondary | ICD-10-CM | POA: Diagnosis not present

## 2018-10-05 DIAGNOSIS — I442 Atrioventricular block, complete: Secondary | ICD-10-CM | POA: Diagnosis present

## 2018-10-05 DIAGNOSIS — E559 Vitamin D deficiency, unspecified: Secondary | ICD-10-CM | POA: Diagnosis present

## 2018-10-05 DIAGNOSIS — J90 Pleural effusion, not elsewhere classified: Secondary | ICD-10-CM | POA: Diagnosis not present

## 2018-10-05 DIAGNOSIS — D62 Acute posthemorrhagic anemia: Secondary | ICD-10-CM | POA: Diagnosis not present

## 2018-10-05 DIAGNOSIS — K920 Hematemesis: Secondary | ICD-10-CM | POA: Diagnosis not present

## 2018-10-05 DIAGNOSIS — J690 Pneumonitis due to inhalation of food and vomit: Secondary | ICD-10-CM | POA: Diagnosis not present

## 2018-10-05 DIAGNOSIS — I878 Other specified disorders of veins: Secondary | ICD-10-CM | POA: Diagnosis not present

## 2018-10-05 DIAGNOSIS — K254 Chronic or unspecified gastric ulcer with hemorrhage: Secondary | ICD-10-CM | POA: Diagnosis not present

## 2018-10-05 DIAGNOSIS — R2689 Other abnormalities of gait and mobility: Secondary | ICD-10-CM | POA: Diagnosis not present

## 2018-10-05 DIAGNOSIS — A419 Sepsis, unspecified organism: Secondary | ICD-10-CM | POA: Diagnosis not present

## 2018-10-05 DIAGNOSIS — L89153 Pressure ulcer of sacral region, stage 3: Secondary | ICD-10-CM | POA: Diagnosis not present

## 2018-10-05 DIAGNOSIS — B371 Pulmonary candidiasis: Secondary | ICD-10-CM | POA: Diagnosis not present

## 2018-10-05 DIAGNOSIS — R000 Tachycardia, unspecified: Secondary | ICD-10-CM | POA: Diagnosis not present

## 2018-10-05 DIAGNOSIS — Z743 Need for continuous supervision: Secondary | ICD-10-CM | POA: Diagnosis not present

## 2018-10-05 DIAGNOSIS — R918 Other nonspecific abnormal finding of lung field: Secondary | ICD-10-CM | POA: Diagnosis not present

## 2018-10-05 DIAGNOSIS — Z5111 Encounter for antineoplastic chemotherapy: Secondary | ICD-10-CM | POA: Diagnosis not present

## 2018-10-05 DIAGNOSIS — Z87891 Personal history of nicotine dependence: Secondary | ICD-10-CM | POA: Diagnosis not present

## 2018-10-05 DIAGNOSIS — L89154 Pressure ulcer of sacral region, stage 4: Secondary | ICD-10-CM | POA: Diagnosis not present

## 2018-10-05 DIAGNOSIS — D479 Neoplasm of uncertain behavior of lymphoid, hematopoietic and related tissue, unspecified: Secondary | ICD-10-CM | POA: Diagnosis not present

## 2018-10-05 DIAGNOSIS — R7881 Bacteremia: Secondary | ICD-10-CM | POA: Diagnosis not present

## 2018-10-05 DIAGNOSIS — R5381 Other malaise: Secondary | ICD-10-CM | POA: Diagnosis not present

## 2018-10-05 DIAGNOSIS — L89159 Pressure ulcer of sacral region, unspecified stage: Secondary | ICD-10-CM | POA: Diagnosis not present

## 2018-10-05 DIAGNOSIS — Z7189 Other specified counseling: Secondary | ICD-10-CM | POA: Diagnosis not present

## 2018-10-05 DIAGNOSIS — I10 Essential (primary) hypertension: Secondary | ICD-10-CM | POA: Diagnosis not present

## 2018-10-05 DIAGNOSIS — E46 Unspecified protein-calorie malnutrition: Secondary | ICD-10-CM | POA: Diagnosis not present

## 2018-10-05 DIAGNOSIS — Z66 Do not resuscitate: Secondary | ICD-10-CM | POA: Diagnosis not present

## 2018-10-05 DIAGNOSIS — E43 Unspecified severe protein-calorie malnutrition: Secondary | ICD-10-CM | POA: Diagnosis not present

## 2018-10-05 DIAGNOSIS — Z5189 Encounter for other specified aftercare: Secondary | ICD-10-CM | POA: Diagnosis not present

## 2018-10-05 MED ORDER — PREDNISONE 10 MG PO TABS: ORAL_TABLET | ORAL | 0 refills | Status: DC

## 2018-10-05 MED ORDER — AMLODIPINE BESYLATE 5 MG PO TABS: 5.0000 mg | ORAL_TABLET | Freq: Every day | ORAL | Status: AC

## 2018-10-05 MED ORDER — ACYCLOVIR 200 MG PO CAPS: 400.0000 mg | ORAL_CAPSULE | Freq: Two times a day (BID) | ORAL | Status: AC

## 2018-10-05 MED ORDER — HYDROCODONE-ACETAMINOPHEN 5-325 MG PO TABS: 1.0000 | ORAL_TABLET | Freq: Four times a day (QID) | ORAL | 0 refills | Status: AC | PRN

## 2018-10-05 MED ORDER — POLYETHYLENE GLYCOL 3350 17 G PO PACK: 17.0000 g | PACK | Freq: Every day | ORAL | 0 refills | Status: AC | PRN

## 2018-10-05 MED ORDER — HEPARIN SOD (PORK) LOCK FLUSH 100 UNIT/ML IV SOLN
250.0000 [IU] | INTRAVENOUS | Status: AC | PRN
  Administered 2018-10-05: 250 [IU]
  Filled 2018-10-05: qty 5

## 2018-10-05 MED ORDER — COLLAGENASE 250 UNIT/GM EX OINT: TOPICAL_OINTMENT | Freq: Every day | CUTANEOUS | 0 refills | Status: AC

## 2018-10-05 NOTE — Progress Notes (Signed)
[facility] PROGRESS NOTE    Progress Note  Patricia Murillo  JSE:[id number] DOB: [birth year] DOA: 09/07/2018 PCP: Patient, No Pcp Per     Brief Narrative:   Patricia Murillo is an 73 y.o. female with B-cell lymphoma, recently discharged from the hospital on after biopsy presents to the ED with progressive weakness, dyspnea fever chills anorexia was found to have Pseudomonas bacteremia as well as enterococcus and Pseudomonas UTI on IV Levaquin awaiting insurance approval for skilled nursing facility.  Assessment/Plan:   Neutropenic fever in the setting of Pseudomonas bacteremia and Pseudomonas UTI: Urine culture and blood culture from 09/21/2018 show Pseudomonas Urine culture shows cervical Monus and enterococcus. She will start empirically on IV cefepime, then transition to IV Levaquin with her last dose on 09/28/2018.  This is covering both her Pseudomonas and enterococcus. Atropine has resolved.  Diffuse large B-cell lymphoma: We will continue to follow-up with Dr. Morey Hummingbird as an outpatient.  Thrombocytopenia: Likely due to chemotherapy now has resolved.  Autoimmune hemolytic anemia: Likely due to lymphoma He status post 2 units of packed red blood cells. Oncology recommended 5 days course of prednisone he is now on 10 mg. He needs to await skilled nursing facility placement.  Hypercalcemia: Likely due to malignancy she is status post Amita now improved.  Acute respiratory failure with hypoxia: Likely due to high-grade B-cell lymphoma with pulmonary and involvement.  Acute toxic and metabolic encephalopathy: Multifactorial in the setting of infectious etiology and malignancy with hypercalcemia. Now resolved.  Moderate protein caloric malnutrition: Nutrition Problem: Moderate Malnutrition Etiology: acute illness, poor appetite. Signs/Symptoms: percent weight loss, energy intake < or equal to 75% for > or equal to 1 month, mild muscle depletion Percent weight  loss: 11 %  Significant left upper extremity swelling: Likely due to lymphatic obstruction upper extremity Doppler negative for DVT   Unstageable pressure ulcer: It is 3.5 x 1.5 cm, wound care was consulted recommended wound clean see insensible.  Conjunctival hemorrhage: Likely due to coughing.  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   DVT prophylaxis: lovenox Family Communication:none Disposition Plan/Barrier to D/C: awaiting placement Code Status:     Code Status Orders  (From admission, onward)         Start     Ordered   09/07/18 1845  Full code  Continuous     09/07/18 1844        Code Status History    Date Active Date Inactive Code Status Order ID Comments User Context   08/29/2018 1206 09/01/2018 1622 DNR [id number]  Karmen Bongo, MD ED   04/18/2018 0951 04/19/2018 1525 Full Code [id number]  Baldwin Jamaica, PA-C ED    Advance Directive Documentation     Most Recent Value  Type of Advance Directive  Healthcare Power of Attorney, Living will  Pre-existing out of facility DNR order (yellow form or pink MOST form)  -  "MOST" Form in Place?  -        IV Access:    Peripheral IV   Procedures and diagnostic studies:   No results found.   Medical Consultants:    None.  Anti-Infectives:   none  Subjective:    Patricia Murillo no complains lying comfortably in bed.  Objective:    Vitals:   10/04/18 2012 10/05/18 0447 10/05/18 0804 10/05/18 0806  BP: (!) 158/67 (!) 163/79    Pulse: 83 82    Resp: 20 20    Temp: 97.9 F (  36.6 C) 98.6 F (37 C)    TempSrc: Oral Oral    SpO2: 97% 97% 97% 97%  Weight:      Height:        Intake/Output Summary (Last 24 hours) at 10/05/2018 0921 Last data filed at 10/05/2018 0827 Gross per 24 hour  Intake 360 ml  Output -  Net 360 ml   Filed Weights   09/26/18 0500 09/27/18 0611 10/01/18 0500  Weight: 109.3 kg 110.2 kg 110.1 kg    Exam: General exam: In no  acute distress. Respiratory system: Good air movement and clear to auscultation. Cardiovascular system: S1 & S2 heard, RRR.  Gastrointestinal system: Abdomen is nondistended, soft and nontender.  Central nervous system: Alert and oriented. No focal neurological deficits. Extremities: No pedal edema. Skin: No rashes, lesions or ulcers Psychiatry: Judgement and insight appear normal. Mood & affect appropriate.    Data Reviewed:    Labs: Basic Metabolic Panel: Recent Labs  Lab 09/29/18 1007 09/30/18 1221 10/03/18 0615  NA 139 138 138  K 3.4* 3.9 3.7  CL 108 108 107  CO2 23 22 23   GLUCOSE 127* 107* 86  BUN 8 13 14   CREATININE 0.43* 0.44 0.30*  CALCIUM 7.8* 7.6* 7.9*  MG  --   --  2.1   GFR Estimated Creatinine Clearance: 81.1 mL/min (A) (by C-G formula based on SCr of 0.3 mg/dL (L)). Liver Function Tests: Recent Labs  Lab 09/29/18 1007  AST 24  ALT 21  ALKPHOS 72  BILITOT 0.6  PROT 5.3*  ALBUMIN 2.7*   No results for input(s): LIPASE, AMYLASE in the last 168 hours. No results for input(s): AMMONIA in the last 168 hours. Coagulation profile No results for input(s): INR, PROTIME in the last 168 hours.  CBC: Recent Labs  Lab 09/29/18 1007 09/30/18 1221 10/03/18 0615  WBC 10.6* 11.9* 5.7  NEUTROABS 7.2 9.5* 5.6  HGB 10.1* 9.5* 9.2*  HCT 32.4* 30.0* 29.1*  MCV 90.3 91.2 90.1  PLT 399 452* 341   Cardiac Enzymes: No results for input(s): CKTOTAL, CKMB, CKMBINDEX, TROPONINI in the last 168 hours. BNP (last 3 results) No results for input(s): PROBNP in the last 8760 hours. CBG: No results for input(s): GLUCAP in the last 168 hours. D-Dimer: No results for input(s): DDIMER in the last 72 hours. Hgb A1c: No results for input(s): HGBA1C in the last 72 hours. Lipid Profile: No results for input(s): CHOL, HDL, LDLCALC, TRIG, CHOLHDL, LDLDIRECT in the last 72 hours. Thyroid function studies: No results for input(s): TSH, T4TOTAL, T3FREE, THYROIDAB in the last 72  hours.  Invalid input(s): FREET3 Anemia work up: No results for input(s): VITAMINB12, FOLATE, FERRITIN, TIBC, IRON, RETICCTPCT in the last 72 hours. Sepsis Labs: Recent Labs  Lab 09/29/18 1007 09/30/18 1221 10/03/18 0615  WBC 10.6* 11.9* 5.7   Microbiology No results found for this or any previous visit (from the past 240 hour(s)).   Medications:   . acyclovir  400 mg Oral BID  . allopurinol  300 mg Oral Daily  . amLODipine  5 mg Oral Daily  . benzonatate  200 mg Oral TID  . chlorpheniramine-HYDROcodone  5 mL Oral Q12H  . cholecalciferol  2,000 Units Oral Daily  . collagenase   Topical Daily  . enoxaparin (LOVENOX) injection  40 mg Subcutaneous Q24H  . famotidine  20 mg Oral Daily  . feeding supplement (ENSURE ENLIVE)  237 mL Oral BID BM  . fluticasone  2 spray Each Nare Daily  .  folic acid  1 mg Oral Daily  . guaiFENesin  600 mg Oral BID  . lactobacillus acidophilus & bulgar  1 tablet Oral TID WC  . levalbuterol  0.63 mg Nebulization BID  . loratadine  10 mg Oral Daily  . mouth rinse  15 mL Mouth Rinse BID  . megestrol  400 mg Oral Daily  . mupirocin ointment   Topical BID  . pantoprazole  40 mg Oral BID  . predniSONE  60 mg Oral Q breakfast   Continuous Infusions: . sodium chloride Stopped (09/27/18 2114)  . sodium chloride Stopped (09/28/18 1533)      LOS: 28 days   Charlynne Cousins  [facility]   *Please refer to [email or web address], password [email or web address] to get updated schedule on who will round on this patient, as hospitalists switch teams weekly. If 7PM-7AM, please contact night-coverage at [email or web address], password [email or web address] for any overnight needs.  10/05/2018, 9:21 AM

## 2018-10-05 NOTE — Clinical Social Work Placement (Addendum)
  D/C Summary sent Nurse call report to: [phone] Patient daughter will transport.   Update: EMS has been called to transport the patient.   CLINICAL SOCIAL WORK PLACEMENT  NOTE  Date:  10/05/2018  Patient Details  Name: Patricia Murillo MRN: [id number] Date of Birth: 19-Jun-1946  Clinical Social Work is seeking post-discharge placement for this patient at the [place] level of care (*CSW will initial, date and re-position this form in  chart as items are completed):  Yes   Patient/family provided with [facility] Work Department's list of facilities offering this level of care within the geographic area requested by the patient (or if unable, by the patient's family).  Yes   Patient/family informed of their freedom to choose among providers that offer the needed level of care, that participate in Medicare, Medicaid or managed care program needed by the patient, have an available bed and are willing to accept the patient.  Yes   Patient/family informed of [facility]'s ownership interest in [facility] and [facility], as well as of the fact that they are under no obligation to receive care at these facilities.  PASRR submitted to EDS on       PASRR number received on       Existing PASRR number confirmed on 10/05/18     FL2 transmitted to all facilities in geographic area requested by pt/family on       FL2 transmitted to all facilities within larger geographic area on 10/05/18     Patient informed that his/her managed care company has contracts with or will negotiate with certain facilities, including the following:  [facility]     Yes   Patient/family informed of bed offers received.  Patient chooses bed at [facility]     Physician recommends and patient chooses bed at      Patient [facility] on 10/05/18.  Patient to be transferred to facility by [facility]/ EMS     Patient family notified on 10/05/18 of transfer.  Name of family member notified:  Daughter      PHYSICIAN Please prepare priority discharge summary, including medications     Additional Comment:    _______________________________________________ Lia Hopping, LCSW 10/05/2018, 9:56 AM

## 2018-10-05 NOTE — Discharge Summary (Signed)
Physician Discharge Summary  PAMALA HAYMAN LZJ:[id number] DOB: 02-07-1946 DOA: 09/07/2018  PCP: Patient, No Pcp Per  Admit date: 09/07/2018 Discharge date: 10/05/2018  Admitted From: home Disposition:  SNF  Recommendations for Outpatient Follow-up:  1. Follow up with Oncologyin 1-2 weeks 2. Please obtain BMP/CBC in one week   Home Health:No Equipment/Devices:none  Discharge Condition:stable CODE STATUS:full Diet recommendation: Heart Healthy   Brief/Interim Summary: 73 y.o. female with B-cell lymphoma, recently discharged from the hospital on after biopsy presents to the ED with progressive weakness, dyspnea fever chills anorexia was found to have Pseudomonas bacteremia as well as enterococcus and Pseudomonas UTI on IV Levaquin awaiting insurance approval for skilled nursing facility.  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 She will start empirically on IV cefepime, then transition to IV Levaquin with her last dose on 09/28/2018.  This is covering both her Pseudomonas and enterococcus. Completed her antibiotic regimen.  Diffuse large B-cell lymphoma: We will continue to follow-up with Dr. Morey Hummingbird as an outpatient.  Thrombocytopenia: Likely due to chemotherapy now has resolved.  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 College recommended prednisone taper over the next 2 weeks.  We will then continue at 10 mg daily.  Hypercalcemia: Likely due to malignancy she is status post Amita now improved.  Acute respiratory failure with hypoxia: Likely due to high-grade B-cell lymphoma with pulmonary and involvement.  Acute toxic and metabolic encephalopathy: Multifactorial in the setting of infectious etiology and malignancy with hypercalcemia. Now resolved.  Moderate protein caloric malnutrition: Nutrition Problem: Moderate Malnutrition Etiology: acute illness, poor appetite. Signs/Symptoms: percent weight loss, energy intake < or equal to 75% for > or equal to 1 month, mild muscle depletion Percent weight loss: 11 %  Significant left upper extremity swelling: Likely due to lymphatic obstruction upper extremity Doppler negative for DVT   Unstageable pressure ulcer: It is 3.5 x 1.5 cm, wound care was consulted recommended wound clean see insensible.  Conjunctival hemorrhage: Likely due to coughing.  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   Discharge Instructions  Discharge Instructions    Diet - low sodium heart healthy   Complete by:  As directed    Increase activity slowly   Complete by:  As directed      Allergies as of 10/05/2018      Reactions   Apixaban Other (See Comments)   Rectal bleeding within 2 days of taking medication      Medication List    TAKE these medications   acetaminophen 650 MG CR tablet Commonly known as:  TYLENOL Take 1,300 mg by mouth every 8 (eight) hours as needed for pain.   acyclovir 200 MG capsule Commonly known as:  ZOVIRAX Take 2 capsules (400 mg total) by mouth 2 (two) times daily.   allopurinol 300 MG tablet Commonly known  as:  ZYLOPRIM Take 1 tablet (300 mg total) by mouth daily.   amLODipine 5 MG tablet Commonly known as:  NORVASC Take 1 tablet (5 mg total) by mouth daily.   azelastine 0.05 %  ophthalmic solution Commonly known as:  OPTIVAR Place 1 drop into both eyes 2 (two) times daily. What changed:    when to take this  reasons to take this   collagenase ointment Commonly known as:  SANTYL Apply topically daily.   ferrous gluconate 324 MG tablet Commonly known as:  FERGON Take 1 tablet (324 mg total) by mouth 2 (two) times daily with a meal.   HYDROcodone-acetaminophen 5-325 MG tablet Commonly known as:  NORCO/VICODIN Take 1 tablet by mouth every 6 (six) hours as needed for moderate pain.   meclizine 12.5 MG tablet Commonly known as:  ANTIVERT Take 1 tablet (12.5 mg total) by mouth 3 (three) times daily as needed for dizziness.   polyethylene glycol packet Commonly known as:  MIRALAX / GLYCOLAX Take 17 g by mouth daily as needed for moderate constipation.   predniSONE 10 MG tablet Commonly known as:  DELTASONE Continue 1 tab (10 mg) daily for 2 days then decrease by 1 mg daily, then stop.   Vitamin D (Cholecalciferol) 50 MCG (2000 UT) Caps Take 2,000 Units by mouth daily.            Durable Medical Equipment  (From admission, onward)         Start     Ordered   09/20/18 0837  For home use only DME 4 wheeled rolling walker with seat  Once    Question:  Patient needs a walker to treat with the following condition  Answer:  Debility   09/20/18 0836   09/20/18 0837  For home use only DME 3 n 1  Once     09/20/18 0836          Allergies  Allergen Reactions  . Apixaban Other (See Comments)    Rectal bleeding within 2 days of taking medication    Consultations:  Oncology   Procedures/Studies: Dg Chest 2 View  Result Date: 09/17/2018 CLINICAL DATA:  Cough. EXAM: CHEST - 2 VIEW COMPARISON:  Radiograph of September 14, 2018. FINDINGS: Stable cardiomediastinal silhouette. Large hiatal hernia is noted. No pneumothorax is noted. Right-sided pacemaker is unchanged in position. Mild bilateral pleural effusions are noted with associated  atelectasis. Bony thorax is unremarkable. IMPRESSION: Stable large hiatal hernia. Mild bilateral pleural effusions are noted with associated atelectasis. Electronically Signed   By: Marijo Conception, M.D.   On: 09/17/2018 14:16   Dg Chest 2 View  Result Date: 09/07/2018 CLINICAL DATA:  Increasing shortness of breath. Decreased oxygen saturation since yesterday. EXAM: CHEST - 2 VIEW COMPARISON:  Single-view of the chest and CT chest 08/29/2018. FINDINGS: There is cardiomegaly without edema. Moderate left pleural effusion and basilar atelectasis are seen. Pulmonary nodules in the left upper lobe and along the periphery of the left lung are identified as seen on prior CT. Small right pulmonary nodule seen on CT are difficult to visualize on this examination. Large hiatal hernia is noted. No pneumothorax. IMPRESSION: No change in a moderate left pleural effusion and left basilar atelectasis. Pulmonary nodules. Cardiomegaly without edema. Hiatal hernia. Electronically Signed   By: Inge Rise M.D.   On: 102/16/2019 09:02   Ct Head Wo Contrast  Result Date: 09/07/2018 CLINICAL DATA:  Sepsis. Recent diagnosis of suspected metastatic disease in chest abdomen and pelvis  on CT studies. New left sided proptosis and left upper and lower extremity weakness. No reported injury. EXAM: CT HEAD WITHOUT CONTRAST TECHNIQUE: Contiguous axial images were obtained from the base of the skull through the vertex without intravenous contrast. COMPARISON:  07/15/2017 head CT. FINDINGS: Brain: No evidence of parenchymal hemorrhage or extra-axial fluid collection. No mass lesion, mass effect, or midline shift. No CT evidence of acute infarction. Nonspecific mild subcortical and periventricular white matter hypodensity, most in keeping with chronic small vessel ischemic change. Cerebral volume is age appropriate. No ventriculomegaly. Vascular: No acute abnormality. Skull: No evidence of calvarial fracture. Sinuses/Orbits: No fluid  levels. Mild mucoperiosteal thickening in the maxillary sinuses bilaterally. Other:  The mastoid air cells are unopacified. IMPRESSION: 1.  No evidence of acute intracranial abnormality. 2. Mild chronic small vessel ischemic changes in the cerebral white matter. 3. Mild chronic appearing paranasal sinusitis. Electronically Signed   By: Ilona Sorrel M.D.   On: 111-14-2019 12:06   Ct Angio Chest Pe W/cm &/or Wo Cm  Result Date: 09/07/2018 CLINICAL DATA:  Shortness of breath and weakness. 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 EXAM: CT ANGIOGRAPHY CHEST WITH CONTRAST TECHNIQUE: Multidetector CT imaging of the chest was performed using the standard protocol during bolus administration of intravenous contrast. Multiplanar CT image reconstructions and MIPs were obtained to evaluate the vascular anatomy. CONTRAST:  147m ISOVUE-370 IOPAMIDOL (ISOVUE-370) INJECTION 76% COMPARISON:  CTA of the chest on 08/29/2018 FINDINGS: Cardiovascular: The pulmonary arteries are adequately opacified. There is no evidence of pulmonary embolism. The thoracic aorta is normal in caliber and demonstrates no evidence of aneurysm or dissection. Proximal great vessels are normally patent. The heart size is stable and at the upper limits of normal. Pacemaker shows stable positioning. No pericardial fluid identified. Mediastinum/Nodes: Massive confluent lymphadenopathy of the left axilla extending into the left subpectoral region and encasing the region of the brachial plexus, left axillary vessels and left subclavian vessels appears progressively enlarged since the prior CTA. Index lateral subpectoral lymph node measured previously at 2.8 cm in short axis now measures approximately 4.1 cm. More inferior left axillary node measured at 2.5 cm in short axis previously now measures 3.1 cm. Confluent lymph node mass encasement of vessels does not cause arterial occlusion but may be causing significant mass  effect on venous structures. The veins are not opacified on the study to determine whether any venous thrombus is present. Associated right hilar and mediastinal lymphadenopathy also appears slightly more prominent compared to the prior study. Largest area right hilar lymphadenopathy measures approximately 2.2 cm. Stable large hiatal hernia. Lungs/Pleura: There also is progressive pulmonary involvement with lymphoma with definite progression since the prior CTA. All of the multiple bilateral pulmonary nodules show enlargement since the prior study. Index mass in the subpleural left lower lobe now measures approximately 2.2 x 3.7 cm (previously 1.7 x 3.3 cm). All other lesions also show enlargement. New small right pleural effusion. Small to moderate left pleural effusion appears stable since the prior study. Upper Abdomen: Stable splenomegaly. Musculoskeletal: No chest wall abnormality. No acute or significant osseous findings. Review of the MIP images confirms the above findings. IMPRESSION: 1. No evidence of pulmonary embolism. 2. Progression of lymphoma since the prior CTA on 11/25 with enlargement of left axillary, subpectoral and chest wall lymph node masses with encasement of left subclavian and axillary vascular structures. Enlargement of right hilar and mediastinal lymph nodes since the prior study. Pulmonary involvement also shows significant progression  with enlargement of all of the bilateral pulmonary nodule seen previously. 3. Normal arterial patency demonstrated of the left subclavian and axillary arteries. Venous patency cannot be determined on the CTA study and the patient would be at high risk of left upper extremity DVT given the large bulky lymphadenopathy present. 4. New small right pleural effusion. Stable small to moderate left pleural effusion. 5. Stable splenomegaly. Electronically Signed   By: Aletta Edouard M.D.   On: 127-Dec-202019 12:29   Dg Chest Port 1 View  Result Date:  09/21/2018 CLINICAL DATA:  Tachypnea. EXAM: PORTABLE CHEST 1 VIEW COMPARISON:  Chest radiographs 2 days prior, chest CT 127-Dec-202019 FINDINGS: Right-sided pacemaker in place. Right upper extremity PICC in place, tip in the proximal SVC. The patient is significantly rotated. Bilateral pleural effusions and associated airspace disease, likely progressed at the left lung base. Nodular density in the periphery of the left lung again seen. Large retrocardiac hiatal hernia. Unchanged heart size and mediastinal contours. No pulmonary edema. IMPRESSION: Bilateral pleural effusions, increased on the left. Unchanged radiographic appearance of large hiatal hernia. Peripheral nodular opacity as seen on prior chest CT. Additional pulmonary nodules on CT are not visualized radiographically Electronically Signed   By: Keith Rake M.D.   On: 09/21/2018 03:42   Dg Chest Port 1 View  Addendum Date: 09/14/2018   ADDENDUM REPORT: 09/14/2018 09:14 ADDENDUM: The impression should read: Stable chest x-ray demonstrating pulmonary edema and bilateral pleural effusions. Electronically Signed   By: Aletta Edouard M.D.   On: 09/14/2018 09:14   Result Date: 09/14/2018 CLINICAL DATA:  Shortness of breath. History of progressive high-grade B-cell lymphoma. EXAM: PORTABLE CHEST 1 VIEW COMPARISON:  Chest x-ray on 09/09/2018 and CT of the chest on 127-Dec-202019. FINDINGS: Stable heart size, appearance of pacemaker and large hiatal hernia. Stable probable bilateral pleural effusions and bilateral pulmonary nodules. No overt pulmonary edema. No pneumothorax. IMPRESSION: Stable chest x-ray demonstrating pulmonary lymphoma and bilateral pleural effusions. Electronically Signed: By: Aletta Edouard M.D. On: 09/11/2018 14:45   Dg Chest Port 1 View  Result Date: 09/14/2018 CLINICAL DATA:  Shortness of breath. EXAM: PORTABLE CHEST 1 VIEW COMPARISON:  Radiographs 09/11/2018 and 09/09/2018. FINDINGS: 0523 hours. Mild patient rotation to the  right. The right arm PICC and right subclavian pacemaker leads appear unchanged. The heart size and mediastinal contours are stable. There is a large hiatal hernia. There are persistent bilateral pleural effusions which appear mildly improved on the right. There is no pneumothorax or confluent airspace opacity. IMPRESSION: No acute findings demonstrated. The right pleural effusion appears slightly smaller. Electronically Signed   By: Richardean Sale M.D.   On: 09/14/2018 09:10   Dg Chest Port 1 View  Result Date: 09/09/2018 CLINICAL DATA:  Hypoxia, history of progressive lymphoma EXAM: PORTABLE CHEST 1 VIEW COMPARISON:  127-Dec-202019 FINDINGS: Cardiac shadow is enlarged. Pacing device is again seen. Bilateral pleural effusions are again noted. The nodular changes seen on recent chest CT are not as well appreciated on today's exam. No pneumothorax is noted. IMPRESSION: Bilateral effusions. Patchy densities are noted consistent findings of recent CT although less well appreciated on today's exam. Electronically Signed   By: Inez Catalina M.D.   On: 09/09/2018 06:35   Dg Abd Portable 1v  Result Date: 09/13/2018 CLINICAL DATA:  Constipation EXAM: PORTABLE ABDOMEN - 1 VIEW COMPARISON:  CT 08/31/2018 FINDINGS: Nonobstructed bowel gas pattern. Residual contrast within the colon. Mild to moderate feces retention at the rectum. IMPRESSION: Nonobstructed gas pattern with mild  to moderate fecal retention at the rectum Electronically Signed   By: Donavan Foil M.D.   On: 09/13/2018 23:51   Dg Swallowing Func-speech Pathology  Result Date: 09/15/2018 Objective Swallowing Evaluation: Type of Study: MBS-Modified Barium Swallow Study  Patient Details Name: ANHAR MCDERMOTT MRN: [id number] Date of Birth: 18-Jul-1946 Today's Date: 09/15/2018 Time: SLP Start Time (ACUTE ONLY): 1210 -SLP Stop Time (ACUTE ONLY): 1240 SLP Time Calculation (min) (ACUTE ONLY): 30 min Past Medical History: Past Medical History: Diagnosis Date .  Arthritis  . Hiatal hernia  . History of chicken pox  . Hyperlipidemia  . Pacemaker  Past Surgical History: Past Surgical History: Procedure Laterality Date . ANKLE SURGERY   . PACEMAKER IMPLANT N/A 04/18/2018  Procedure: PACEMAKER IMPLANT;  Surgeon: Evans Lance, MD;  Location: [facility];  Service: Cardiovascular;  Laterality: N/A; HPI: ZORIA RAWLINSON is a 73 y.o. 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  During her hospitalization she underwent left axillary lymph node biopsy.  She was discharged home in a stable condition, but apparently she has been developing worsening weakness, generalized, worse with exertion, no improving factors, associated with malaise, chills and poor appetite.  Over the last 24 hours her symptoms have been more severe to the point where she has difficulty standing. She has persistent left upper extremity edema which has been painful, sharp in nature up to 10 out of 10 intensity.  Her weakness has led to dyspnea which has been severe in intensity, and triggered her emergency room visit today.Pt was made NPO yesterday due to concerns for aspiration.   Today pt is alert, has congested weak cough concerrning for airway protection.   Pt underwent MBS yesterday and was placed on full liquid diet with precautions. RN reports pt continues to cough with po intake.  Per note from MD, now plan is for pt to have small bore feeding tube placed for nutritional support.  Unfortunately pt did not tolerate attempt at insertion of feeding tube.  Repeat swallow evaluation ordered.  SLP follow up to inform family/pt of plan and determine if clinically pt appears improved.    Subjective: Alert Assessment / Plan / Recommendation CHL IP CLINICAL IMPRESSIONS 09/15/2018 Clinical Impression Patient had a MBS on 09/12/18 and patient presented with Moderately  severe oropharyngeal dysphagia with sensorimotor deficits. She presents much stronger today and presents with a mild oropharyngeal dysphagia. Oral preparation and bolus transfer was mild evidenced by trace oral residue. Patient initiated swallow at the level of the pyriform sinuses with no penetration or aspiration with cup sips of all sizes. She did have penetration with cup sip with straw, but spontaneously cleared layngeal vestibule. She initiated swallow at the valleculae with puree and cracker bolus, no penetration or aspiration. Significant pharyngeal residue present after 1st swallow, but improved mostly cleared with second spontaneous swallow. Upper 1/3 of esphagus observed to clear well with puree and tablet. Due to significant sensorimotor improvement, recommend patient have regular diet with thin liquids, medication whole in puree. Speech therapy to follow up for continue diet tolerance and swallowing therapy. SLP Visit Diagnosis Dysphagia, oropharyngeal phase (R13.12) Attention and concentration deficit following -- Frontal lobe and executive function deficit following -- Impact on safety and function Mild aspiration risk   CHL IP TREATMENT RECOMMENDATION 09/15/2018 Treatment Recommendations Therapy as outlined in treatment plan below   Prognosis 09/15/2018  Prognosis for Safe Diet Advancement Good Barriers to Reach Goals -- Barriers/Prognosis Comment -- CHL IP DIET RECOMMENDATION 09/15/2018 SLP Diet Recommendations Regular solids;Thin liquid Liquid Administration via Cup;No straw Medication Administration Whole meds with liquid Compensations Slow rate;Small sips/bites;Other (Comment) Postural Changes Remain semi-upright after after feeds/meals (Comment);Seated upright at 90 degrees   CHL IP OTHER RECOMMENDATIONS 09/15/2018 Recommended Consults -- Oral Care Recommendations Oral care before and after PO Other Recommendations --   CHL IP FOLLOW UP RECOMMENDATIONS 09/14/2018 Follow up Recommendations (No  Data)   CHL IP FREQUENCY AND DURATION 09/15/2018 Speech Therapy Frequency (ACUTE ONLY) min 2x/week Treatment Duration 1 week      CHL IP ORAL PHASE 09/15/2018 Oral Phase Impaired Oral - Pudding Teaspoon -- Oral - Pudding Cup Lingual/palatal residue Oral - Honey Teaspoon -- Oral - Honey Cup -- Oral - Nectar Teaspoon NT Oral - Nectar Cup NT Oral - Nectar Straw NT Oral - Thin Teaspoon Lingual/palatal residue Oral - Thin Cup Lingual/palatal residue;Impaired mastication Oral - Thin Straw Lingual/palatal residue Oral - Puree Lingual/palatal residue Oral - Mech Soft Lingual/palatal residue Oral - Regular -- Oral - Multi-Consistency -- Oral - Pill -- Oral Phase - Comment --  CHL IP PHARYNGEAL PHASE 09/15/2018 Pharyngeal Phase Impaired Pharyngeal- Pudding Teaspoon Delayed swallow initiation-vallecula;Pharyngeal residue - valleculae;Pharyngeal residue - pyriform Pharyngeal -- Pharyngeal- Pudding Cup NT Pharyngeal -- Pharyngeal- Honey Teaspoon -- Pharyngeal -- Pharyngeal- Honey Cup -- Pharyngeal -- Pharyngeal- Nectar Teaspoon NT Pharyngeal -- Pharyngeal- Nectar Cup NT Pharyngeal -- Pharyngeal- Nectar Straw NT Pharyngeal -- Pharyngeal- Thin Teaspoon Delayed swallow initiation-pyriform sinuses;Reduced pharyngeal peristalsis;Reduced anterior laryngeal mobility;Reduced laryngeal elevation;Reduced airway/laryngeal closure;Reduced tongue base retraction;Pharyngeal residue - valleculae;Pharyngeal residue - pyriform Pharyngeal -- Pharyngeal- Thin Cup Delayed swallow initiation-pyriform sinuses;Reduced anterior laryngeal mobility;Reduced laryngeal elevation;Reduced airway/laryngeal closure;Reduced tongue base retraction;Pharyngeal residue - valleculae;Pharyngeal residue - pyriform;Reduced pharyngeal peristalsis Pharyngeal -- Pharyngeal- Thin Straw Delayed swallow initiation-pyriform sinuses;Reduced anterior laryngeal mobility;Reduced laryngeal elevation;Reduced airway/laryngeal closure;Reduced pharyngeal peristalsis;Reduced tongue base  retraction;Pharyngeal residue - valleculae;Pharyngeal residue - pyriform;Penetration/Aspiration during swallow Pharyngeal -- Pharyngeal- Puree Delayed swallow initiation-vallecula;Reduced laryngeal elevation;Reduced airway/laryngeal closure;Reduced tongue base retraction;Pharyngeal residue - valleculae Pharyngeal -- Pharyngeal- Mechanical Soft Delayed swallow initiation-vallecula;Reduced laryngeal elevation;Reduced airway/laryngeal closure;Reduced tongue base retraction Pharyngeal -- Pharyngeal- Regular NT Pharyngeal -- Pharyngeal- Multi-consistency -- Pharyngeal -- Pharyngeal- Pill Delayed swallow initiation-vallecula;Reduced laryngeal elevation;Reduced airway/laryngeal closure Pharyngeal -- Pharyngeal Comment --  CHL IP CERVICAL ESOPHAGEAL PHASE 09/15/2018 Cervical Esophageal Phase WFL Pudding Teaspoon -- Pudding Cup -- Honey Teaspoon -- Honey Cup -- Nectar Teaspoon -- Nectar Cup -- Nectar Straw -- Thin Teaspoon -- Thin Cup -- Thin Straw -- Puree -- Mechanical Soft -- Regular -- Multi-consistency -- Pill -- Cervical Esophageal Comment -- Charlynne Cousins Ward, MA, CCC-SLP 09/15/2018 2:59 PM              Dg Swallowing Func-speech Pathology  Result Date: 09/12/2018 Objective Swallowing Evaluation: Type of Study: MBS-Modified Barium Swallow Study  Patient Details Name: RUBYANN LINGLE MRN: [id number] Date of Birth: July 17, 1946 Today's Date: 09/12/2018 Time: SLP Start Time (ACUTE ONLY): 1300 -SLP Stop Time (ACUTE ONLY): 1337 SLP Time Calculation (min) (ACUTE ONLY): 37 min Past Medical History: Past Medical History: Diagnosis Date . Arthritis  . Hiatal hernia  . History of chicken pox  . Hyperlipidemia  . Pacemaker  Past Surgical History: Past Surgical History: Procedure Laterality Date . ANKLE SURGERY   . PACEMAKER IMPLANT N/A 04/18/2018  Procedure: PACEMAKER IMPLANT;  Surgeon: Evans Lance, MD;  Location: [facility];  Service: Cardiovascular;  Laterality: N/A; HPI: SUSEN HASKEW is a 73 y.o. 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  During her hospitalization she underwent left axillary lymph node biopsy.  She was discharged home in a stable condition, but apparently she has been developing worsening weakness, generalized, worse with exertion, no improving factors, associated with malaise, chills and poor appetite.  Over the last 24 hours her symptoms have been more severe to the point where she has difficulty standing. She has persistent left upper extremity edema which has been painful, sharp in nature up to 10 out of 10 intensity.  Her weakness has led to dyspnea which has been severe in intensity, and triggered her emergency room visit today.Pt was made NPO yesterday due to concerns for aspiration.   Today pt is alert, has congested weak cough concerrning for airway protection.  Subjective: The patient was seen sitting upright in bed with her daughter at the bedside.  Assessment / Plan / Recommendation CHL IP CLINICAL IMPRESSIONS 09/12/2018 Clinical Impression Pt presents with moderately severe oropharyngeal dysphagia with sensorimotor deficits.  Decreased oral propulsion and weak pharyngeal motility results in significant pharyngeal more than oral residuals.  Pt does NOT sense residuals and is unable to "hock" to expectorate to clear them.  Chin tuck, head turn postures did NOT decrease residuals.  Pt did aspirate with thin liquids posterior trachea with delayed cough response.  Although she did not aspirate with thicker consistencies, increased residuals present which if aspiration post-swallow are more difficult for pulmonary clearance.  Pt is grossly weak at this time - uncertain of her baseline swallow function.  Pt is a high aspiration/malnutrition risk due to level of dysphagia and weak nonproductive cough.  If she is to consume po intake= it should be with  accepted risks and mitigation strategies.  Question if pt's swallow with improve with improved medical status. SLP Visit Diagnosis Dysphagia, oropharyngeal phase (R13.12) Attention and concentration deficit following -- Frontal lobe and executive function deficit following -- Impact on safety and function Severe aspiration risk;Risk for inadequate nutrition/hydration   [facility] IP TREATMENT RECOMMENDATION 09/11/2018 Treatment Recommendations Therapy as outlined in treatment plan below   Prognosis 09/12/2018 Prognosis for Safe Diet Advancement Fair Barriers to Reach Goals Severity of deficits;Cognitive deficits;Other (Comment) Barriers/Prognosis Comment -- [facility] IP DIET RECOMMENDATION 09/12/2018 SLP Diet Recommendations (No Data) Liquid Administration via Cup Medication Administration Crushed with puree Compensations Slow rate;Small sips/bites;Follow solids with liquid Postural Changes Remain semi-upright after after feeds/meals (Comment);Seated upright at 90 degrees   [facility] IP OTHER RECOMMENDATIONS 09/12/2018 Recommended Consults -- Oral Care Recommendations Oral care before and after PO Other Recommendations Have oral suction available   [facility] IP FOLLOW UP RECOMMENDATIONS 09/12/2018 Follow up Recommendations (No Data)   [facility] IP FREQUENCY AND DURATION 09/12/2018 Speech Therapy Frequency (ACUTE ONLY) min 2x/week Treatment Duration 1 week      [facility] IP ORAL PHASE 09/12/2018 Oral Phase Impaired Oral - Pudding Teaspoon -- Oral - Pudding Cup -- Oral - Honey Teaspoon -- Oral - Honey Cup -- Oral - Nectar Teaspoon Weak lingual manipulation;Reduced posterior propulsion;Lingual/palatal residue Oral - Nectar Cup Weak lingual manipulation;Reduced posterior propulsion;Lingual/palatal residue Oral - Nectar Straw Weak lingual manipulation;Reduced posterior propulsion;Lingual/palatal residue Oral - Thin Teaspoon Weak lingual manipulation;Reduced posterior propulsion Oral - Thin Cup Weak lingual manipulation;Reduced posterior propulsion Oral - Thin  Straw Reduced posterior propulsion Oral - Puree Weak lingual  manipulation;Reduced posterior propulsion Oral - Mech Soft Weak lingual manipulation;Reduced posterior propulsion Oral - Regular -- Oral - Multi-Consistency -- Oral - Pill -- Oral Phase - Comment --  [facility] IP PHARYNGEAL PHASE 09/12/2018 Pharyngeal Phase Impaired Pharyngeal- Pudding Teaspoon -- Pharyngeal -- Pharyngeal- Pudding Cup -- Pharyngeal -- Pharyngeal- Honey Teaspoon -- Pharyngeal -- Pharyngeal- Honey Cup -- Pharyngeal -- Pharyngeal- Nectar Teaspoon Reduced pharyngeal peristalsis;Reduced epiglottic inversion;Reduced tongue base retraction;Pharyngeal residue - valleculae;Pharyngeal residue - pyriform Pharyngeal -- Pharyngeal- Nectar Cup Reduced pharyngeal peristalsis;Reduced epiglottic inversion;Reduced laryngeal elevation;Reduced airway/laryngeal closure;Reduced tongue base retraction;Pharyngeal residue - valleculae;Pharyngeal residue - pyriform;Penetration/Aspiration during swallow Pharyngeal Material enters airway, remains ABOVE vocal cords and not ejected out Pharyngeal- Nectar Straw Reduced pharyngeal peristalsis;Reduced epiglottic inversion;Reduced anterior laryngeal mobility;Reduced airway/laryngeal closure;Reduced tongue base retraction;Reduced laryngeal elevation;Pharyngeal residue - valleculae;Pharyngeal residue - pyriform Pharyngeal -- Pharyngeal- Thin Teaspoon Reduced pharyngeal peristalsis;Reduced epiglottic inversion;Reduced laryngeal elevation;Reduced airway/laryngeal closure;Reduced tongue base retraction;Pharyngeal residue - valleculae;Pharyngeal residue - pyriform Pharyngeal -- Pharyngeal- Thin Cup Reduced pharyngeal peristalsis;Reduced epiglottic inversion;Reduced laryngeal elevation;Reduced airway/laryngeal closure;Reduced tongue base retraction;Pharyngeal residue - valleculae;Pharyngeal residue - pyriform;Penetration/Aspiration during swallow Pharyngeal Material enters airway, passes BELOW cords without attempt by patient to eject  out (silent aspiration) Pharyngeal- Thin Straw Reduced pharyngeal peristalsis;Reduced epiglottic inversion;Reduced laryngeal elevation;Reduced airway/laryngeal closure;Reduced tongue base retraction;Pharyngeal residue - valleculae;Pharyngeal residue - pyriform Pharyngeal Material enters airway, passes BELOW cords and not ejected out despite cough attempt by patient Pharyngeal- Puree Reduced epiglottic inversion;Reduced pharyngeal peristalsis;Reduced tongue base retraction;Reduced airway/laryngeal closure;Reduced laryngeal elevation;Pharyngeal residue - valleculae;Pharyngeal residue - pyriform Pharyngeal -- Pharyngeal- Mechanical Soft Reduced pharyngeal peristalsis;Reduced epiglottic inversion;Reduced tongue base retraction;Pharyngeal residue - valleculae;Reduced airway/laryngeal closure Pharyngeal -- Pharyngeal- Regular -- Pharyngeal -- Pharyngeal- Multi-consistency -- Pharyngeal -- Pharyngeal- Pill -- Pharyngeal -- Pharyngeal Comment --  [facility] IP CERVICAL ESOPHAGEAL PHASE 09/12/2018 Cervical Esophageal Phase Impaired Pudding Teaspoon -- Pudding Cup -- Honey Teaspoon -- Honey Cup -- Nectar Teaspoon -- Nectar Cup -- Nectar Straw -- Thin Teaspoon -- Thin Cup -- Thin Straw -- Puree -- Mechanical Soft -- Regular -- Multi-consistency -- Pill -- Cervical Esophageal Comment upon esophageal sweep, pt appeared clear Luanna Salk, MS [facility] SLP Acute Rehab Services Pager [phone] Office [phone] Macario Golds 09/12/2018, 5:29 PM              Ir Picc Placement Right >5 Yrs Inc Img Guide  Result Date: 09/09/2018 INDICATION: Acute lymphoma, access for chemotherapy as an inpatient EXAM: ULTRASOUND AND FLUOROSCOPIC GUIDED PICC LINE INSERTION MEDICATIONS: 1% lidocaine local CONTRAST:  None FLUOROSCOPY TIME:  Twenty-four seconds (8 mGy) COMPLICATIONS: None immediate. TECHNIQUE: The procedure, risks, benefits, and alternatives were explained to the patient and informed written consent was obtained. A timeout was  performed prior to the initiation of the procedure. The right upper extremity was prepped with chlorhexidine in a sterile fashion, and a sterile drape was applied covering the operative field. Maximum barrier sterile technique with sterile gowns and gloves were used for the procedure. A timeout was performed prior to the initiation of the procedure. Local anesthesia was provided with 1% lidocaine. Under direct ultrasound guidance, the right brachial vein was accessed with a micropuncture kit after the overlying soft tissues were anesthetized with 1% lidocaine. An ultrasound image was saved for documentation purposes. A guidewire was advanced to the level of the superior caval-atrial junction for measurement purposes and the PICC line was cut to length. A peel-away sheath was placed and a 36 cm, 5 [place], dual lumen was inserted to level of the superior caval-atrial junction.  A post procedure spot fluoroscopic was obtained. The catheter easily aspirated and flushed and was sutured in place. A dressing was placed. The patient tolerated the procedure well without immediate post procedural complication. FINDINGS: After catheter placement, the tip lies within the superior cavoatrial junction. The catheter aspirates and flushes normally and is ready for immediate use. IMPRESSION: Successful ultrasound and fluoroscopic guided placement of a right brachial vein approach, 36 cm, 5 French, dual lumen PICC with tip at the superior caval-atrial junction. The PICC line is ready for immediate use. Electronically Signed   By: Jerilynn Mages.  Shick M.D.   On: 09/09/2018 09:23   [place] Ekg Site Rite  Result Date: 09/08/2018 If Site Rite image not attached, placement could not be confirmed due to current cardiac rhythm.   Subjective: No complaints feels great.  Discharge Exam: Vitals:   10/05/18 0804 10/05/18 0806  BP:    Pulse:    Resp:    Temp:    SpO2: 97% 97%   Vitals:   10/04/18 2012 10/05/18 0447 10/05/18 0804 10/05/18 0806   BP: (!) 158/67 (!) 163/79    Pulse: 83 82    Resp: 20 20    Temp: 97.9 F (36.6 C) 98.6 F (37 C)    TempSrc: Oral Oral    SpO2: 97% 97% 97% 97%  Weight:      Height:        General: Pt is alert, awake, not in acute distress Cardiovascular: RRR, S1/S2 +, no rubs, no gallops Respiratory: CTA bilaterally, no wheezing, no rhonchi Abdominal: Soft, NT, ND, bowel sounds + Extremities: no edema, no cyanosis    The results of significant diagnostics from this hospitalization (including imaging, microbiology, ancillary and laboratory) are listed below for reference.     Microbiology: No results found for this or any previous visit (from the past 240 hour(s)).   Labs: BNP (last 3 results) No results for input(s): BNP in the last 8760 hours. Basic Metabolic Panel: Recent Labs  Lab 09/29/18 1007 09/30/18 1221 10/03/18 0615  NA 139 138 138  K 3.4* 3.9 3.7  CL 108 108 107  CO2 _0 GLUCOSE 127* 107* 86  BUN _1 CREATININE 0.43* 0.44 0.30*  CALCIUM 7.8* 7.6* 7.9*  MG  --   --  2.1   Liver Function Tests: Recent Labs  Lab 09/29/18 1007  AST 24  ALT 21  ALKPHOS 72  BILITOT 0.6  PROT 5.3*  ALBUMIN 2.7*   No results for input(s): LIPASE, AMYLASE in the last 168 hours. No results for input(s): AMMONIA in the last 168 hours. CBC: Recent Labs  Lab 09/29/18 1007 09/30/18 1221 10/03/18 0615  WBC 10.6* 11.9* 5.7  NEUTROABS 7.2 9.5* 5.6  HGB 10.1* 9.5* 9.2*  HCT 32.4* 30.0* 29.1*  MCV 90.3 91.2 90.1  PLT 399 452* 341   Cardiac Enzymes: No results for input(s): CKTOTAL, CKMB, CKMBINDEX, TROPONINI in the last 168 hours. BNP: Invalid input(s): POCBNP CBG: No results for input(s): GLUCAP in the last 168 hours. D-Dimer No results for input(s): DDIMER in the last 72 hours. Hgb A1c No results for input(s): HGBA1C in the last 72 hours. Lipid Profile No results for input(s): CHOL, HDL, LDLCALC, TRIG, CHOLHDL, LDLDIRECT in the last 72 hours. Thyroid function  studies No results for input(s): TSH, T4TOTAL, T3FREE, THYROIDAB in the last 72 hours.  Invalid input(s): FREET3 Anemia work up No results for input(s): VITAMINB12, FOLATE, FERRITIN, TIBC, IRON, RETICCTPCT in the last  72 hours. Urinalysis    Component Value Date/Time   COLORURINE YELLOW 09/21/2018 1543   APPEARANCEUR HAZY (A) 09/21/2018 1543   LABSPEC 1.015 09/21/2018 1543   PHURINE 6.0 09/21/2018 1543   GLUCOSEU NEGATIVE 09/21/2018 1543   HGBUR SMALL (A) 09/21/2018 1543   BILIRUBINUR NEGATIVE 09/21/2018 1543   KETONESUR NEGATIVE 09/21/2018 1543   PROTEINUR 100 (A) 09/21/2018 1543   NITRITE NEGATIVE 09/21/2018 1543   LEUKOCYTESUR MODERATE (A) 09/21/2018 1543   Sepsis Labs Invalid input(s): PROCALCITONIN,  WBC,  LACTICIDVEN Microbiology No results found for this or any previous visit (from the past 240 hour(s)).   Time coordinating discharge: Over 40 minutes  SIGNED:   Charlynne Cousins, MD  [facility] 10/05/2018, 9:45 AM Pager   If 7PM-7AM, please contact night-coverage [email or web address] Password [email or web address]

## 2018-10-05 NOTE — Progress Notes (Signed)
Discharge report called to [facility], spoke with Mia. Patient transferred by Ptar,daughter at bedside and taking patient's belonging. Double lumen picc flushed with 5 ml normal saline and 250 units of heparin in each lumen.

## 2018-10-06 DIAGNOSIS — A4152 Sepsis due to Pseudomonas: Secondary | ICD-10-CM | POA: Diagnosis not present

## 2018-10-06 DIAGNOSIS — C8510 Unspecified B-cell lymphoma, unspecified site: Secondary | ICD-10-CM | POA: Diagnosis not present

## 2018-10-06 DIAGNOSIS — D591 Other autoimmune hemolytic anemias: Secondary | ICD-10-CM | POA: Diagnosis not present

## 2018-10-06 DIAGNOSIS — E440 Moderate protein-calorie malnutrition: Secondary | ICD-10-CM | POA: Diagnosis not present

## 2018-10-09 ENCOUNTER — Telehealth: Payer: Self-pay | Admitting: Hematology

## 2018-10-09 NOTE — Telephone Encounter (Signed)
Spoke with dtr Lannette Donath re 1/8 appointments.

## 2018-10-10 NOTE — Progress Notes (Signed)
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       High grade B-cell lymphoma ([place])   08/30/2018 Pathology Results    08/30/2018 Surgical Pathology Diagnosis Lymph node, needle/core biopsy, Left Axilla - HIGH GRADE B-CELL LYMPHOMA - SEE COMMENT    09/07/2018 Initial Diagnosis    High grade B-cell lymphoma ([place])    09/07/2018 Imaging    09/07/2018 CT Angio Chest IMPRESSION: 1. No evidence of pulmonary embolism. 2. Progression of lymphoma since the prior CTA on 11/25 with enlargement of left axillary, subpectoral and chest wall lymph node masses with encasement of left subclavian and axillary vascular structures. Enlargement of right hilar and mediastinal lymph nodes since the prior study. Pulmonary involvement also shows significant progression with enlargement of all of the bilateral pulmonary nodule seen previously. 3. Normal arterial patency demonstrated of the left subclavian and axillary arteries. Venous patency cannot be determined on the CTA study and the patient would be at high risk of left upper extremity DVT given the large bulky lymphadenopathy present. 4. New small right pleural effusion. Stable small to moderate left pleural effusion. 5. Stable splenomegaly    09/07/2018 Imaging    09/07/2018 CT Head IMPRESSION: 1.  No evidence of acute intracranial abnormality. 2. Mild chronic small vessel ischemic changes in the cerebral white matter. 3. Mild chronic appearing paranasal sinusitis.    09/07/2018 Miscellaneous    09/07/2018 Cytogenetics Normal, however, 30% of the cells showed an extra copy of BCL6 suggesting the presence of an  extra chromosome 3 or 3q.    09/09/2018 -  Chemotherapy    R-CHOP    09/28/2018 Imaging    09/28/2018 CT Angio Chest IMPRESSION: 1. No acute large central pulmonary embolus, aortic aneurysm or dissection. 2. Multilobar masslike opacities scattered throughout both lungs as above described, the largest in the lingula measuring up to 3.3 cm, presumed metastatic given left axillary adenopathy also noted. 3. Left axillary nodal enlargement measuring up to 2.8 cm short axis. 4. Asymmetric soft tissue enlargement of the left pectoralis muscle suspicious for intramuscular hemorrhage and edema given reported rapid onset of left arm swelling and clinical report of low hemoglobin. An infiltrating mass or metastasis is also a differential considerations given local adenopathy, asymmetric subcutaneous edema of the partially included left breast with thickening of the skin concerning for possible inflammatory carcinoma of the breast. Clinical correlation is therefore recommended as well as mammographic correlation. 5. Moderate left and trace right pleural effusions with compressive atelectasis.    09/30/2018 Imaging    09/30/2018 CT AP IMPRESSION: 1. Bulky pelvic sidewall lymphadenopathy with borderline to mild lymphadenopathy in the hepato duodenal ligament and para-aortic retroperitoneal space. Lymphoma or metastatic disease would be primary considerations. 2. Hepatomegaly with borderline splenomegaly. Prominent portal and splenic veins raise the question of portal venous hypertension. 3. Gallbladder sludge or tiny stones. 4. Left chest wall and upper abdominal wall edema. 5. Large hiatal hernia with bilateral pleural effusions, similar to prior. 6.  Aortic Atherosclerois (ICD10-170.0)     CURRENT THERAPY: R-CHOP started 09/09/2018   INTERVAL HISTORY: Patricia Murillo is a 73 y.o. female who is here for follow-up. She was initially hospitalized  for severe anemia and left arm  swelling. CT at that time showed bilateral lung nodules, left pleural effusion, and a mass in the left chest wall with significant left axillary adenopathy. LN biopsy confirmed high grade lymphoma. She started chemo R-CHOP in the hospital, and was discharged to SNF on 10/05/2018.  Today, she is here with her daughter. She is able to do PT, mainly in a wheel chair. She feels slightly dizzy and is on a wheelchair. She doesn't have good appetite but tries to eat.  She feels very fatigued, and complains of moderate to severe pain from the pressure ulcer below tailbone. She has any fever, chills, bleeding, since her hospital discharge.  Pertinent positives and negatives of review of systems are listed and detailed within the above HPI.  REVIEW OF SYSTEMS:   Constitutional: Denies fevers, chills or abnormal weight loss (+) low appetite  Eyes: Denies blurriness of vision Ears, nose, mouth, throat, and face: Denies mucositis or sore throat Respiratory: Denies cough, dyspnea or wheezes Cardiovascular: Denies palpitation, chest discomfort or lower extremity swelling Gastrointestinal:  Denies nausea, heartburn or change in bowel habits Skin: Denies abnormal skin rashes Lymphatics: Denies new lymphadenopathy or easy bruising Neurological:Denies numbness, tingling or new weaknesses Behavioral/Psych: Mood is stable, no new changes  All other systems were reviewed with the patient and are negative.  MEDICAL HISTORY:  Past Medical History:  Diagnosis Date  . Arthritis   . Hiatal hernia   . History of chicken pox   . Hyperlipidemia   . Pacemaker     SURGICAL HISTORY: Past Surgical History:  Procedure Laterality Date  . ANKLE SURGERY    . PACEMAKER IMPLANT N/A 04/18/2018   Procedure: PACEMAKER IMPLANT;  Surgeon: Evans Lance, MD;  Location: [facility];  Service: Cardiovascular;  Laterality: N/A;    I have reviewed the social history and family history with the patient and they are  unchanged from previous note.  ALLERGIES:  is allergic to apixaban.  MEDICATIONS:  Current Outpatient Medications  Medication Sig Dispense Refill  . acetaminophen (TYLENOL) 650 MG CR tablet Take 1,300 mg by mouth every 8 (eight) hours as needed for pain.    Marland Kitchen acyclovir (ZOVIRAX) 200 MG capsule Take 2 capsules (400 mg total) by mouth 2 (two) times daily.    Marland Kitchen allopurinol (ZYLOPRIM) 300 MG tablet Take 1 tablet (300 mg total) by mouth daily. 30 tablet 3  . amLODipine (NORVASC) 5 MG tablet Take 1 tablet (5 mg total) by mouth daily.    Marland Kitchen azelastine (OPTIVAR) 0.05 % ophthalmic solution Place 1 drop into both eyes 2 (two) times daily. (Patient taking differently: Place 1 drop into both eyes 2 (two) times daily as needed (vertigo). ) 6 mL 1  . collagenase (SANTYL) ointment Apply topically daily. 15 g 0  . ferrous gluconate (FERGON) 324 MG tablet Take 1 tablet (324 mg total) by mouth 2 (two) times daily with a meal. 60 tablet 1  . polyethylene glycol (MIRALAX / GLYCOLAX) packet Take 17 g by mouth daily as needed for moderate constipation. 14 each 0  . Vitamin D, Cholecalciferol, 50 MCG (2000 UT) CAPS Take 2,000 Units by mouth daily.    . ciprofloxacin (CIPRO) 500 MG tablet Take 1 tablet (500 mg total) by mouth 2 (two) times daily. 10 tablet 0  . HYDROcodone-acetaminophen (NORCO/VICODIN) 5-325 MG tablet Take 1 tablet by mouth every 6 (six) hours as needed for moderate pain. (Patient not taking: Reported on 10/11/2018) 10 tablet 0  .  meclizine (ANTIVERT) 12.5 MG tablet Take 1 tablet (12.5 mg total) by mouth 3 (three) times daily as needed for dizziness. (Patient not taking: Reported on 10/11/2018) 30 tablet 0   No current facility-administered medications for this visit.     PHYSICAL EXAMINATION: ECOG PERFORMANCE STATUS: 4 - Bedbound  Vitals:   10/11/18 1108  BP: 119/88  Pulse: 70  Resp: 18  Temp: 98.8 F (37.1 C)  SpO2: 100%   Filed Weights   10/11/18 1108  Weight: 146 lb 12.8 oz (66.6 kg)     GENERAL:alert, no distress and comfortable (+) on wheelchair SKIN: skin color, texture, turgor are normal, no rashes or significant lesions EYES: normal, Conjunctiva are pink and non-injected, sclera clear OROPHARYNX:no exudate, no erythema and lips, buccal mucosa, and tongue normal  NECK: supple, thyroid normal size, non-tender, without nodularity LYMPH:  no palpable lymphadenopathy in the cervical, axillary or inguinal LUNGS: clear to auscultation and percussion with normal breathing effort HEART: regular rate & rhythm and no murmurs and no lower extremity edema ABDOMEN:abdomen soft, non-tender and normal bowel sounds Musculoskeletal:no cyanosis of digits and no clubbing (+) sacral ulcer covered with gauze  NEURO: alert & oriented x 3 with fluent speech, no focal motor/sensory deficits  LABORATORY DATA:  I have reviewed the data as listed CBC Latest Ref Rng & Units 10/11/2018 10/03/2018 09/30/2018  WBC 4.0 - 10.5 K/uL 0.9(LL) 5.7 11.9(H)  Hemoglobin 12.0 - 15.0 g/dL 10.3(L) 9.2(L) 9.5(L)  Hematocrit 36.0 - 46.0 % 31.2(L) 29.1(L) 30.0(L)  Platelets 150 - 400 K/uL 228 341 452(H)     CMP Latest Ref Rng & Units 10/11/2018 10/03/2018 09/30/2018  Glucose 70 - 99 mg/dL 111(H) 86 107(H)  BUN 8 - 23 mg/dL _0 Creatinine 0.44 - 1.00 mg/dL 0.58 0.30(L) 0.44  Sodium 135 - 145 mmol/L 131(L) 138 138  Potassium 3.5 - 5.1 mmol/L 4.2 3.7 3.9  Chloride 98 - 111 mmol/L 99 107 108  CO2 22 - 32 mmol/L 20(L) 23 22  Calcium 8.9 - 10.3 mg/dL 9.0 7.9(L) 7.6(L)  Total Protein 6.5 - 8.1 g/dL 6.0(L) - -  Total Bilirubin 0.3 - 1.2 mg/dL 2.5(H) - -  Alkaline Phos 38 - 126 U/L 89 - -  AST 15 - 41 U/L 9(L) - -  ALT 0 - 44 U/L 19 - -   PATHOLOGY  09/07/2018 Cytogenetics    08/30/2018 Surgical Pathology ADDITIONAL INFORMATION: CASE SUMMARY: The purpose of this addendum is to include the [place] results; the addition of these results does not change the original diagnosis. ** Please note this  testing was performed and interpreted by an outside facility. This addendum is only being added to provide a summary of the results for report completeness. ** FISH: Normal (See NOTE) Probes analyzed (BCL6, MYC, MYC;IgH, IgH;BCL2) NOTE: While the probe specific abnormalities were not identified: 1) 30% of cells showed an extra copy of BCL6 suggesting the presence of an extra chromosome 3 or 3q. 2) 72.5% of cells showed an extra copy of IgH suggesting the presence of an extra chromosome 14 or break of IgH. 3) 52.5% of cells showed an extra copy of IgH and BCL2 suggesting the presence of an extra chromosome 14 or break of IgH and an extra chromosome 18. See electronic medical record for complete FISH report.  Mum-1 is POSITIVE by immunohistochemistry. EBV by in-situ hybridization and C-Myc by immunohistochemistry are negative. Overall, the immunophenotype is consistent with an activated B-cell (ABC) subtype.  Diagnosis Lymph node, needle/core  biopsy, Left Axilla - HIGH GRADE B-CELL LYMPHOMA - SEE COMMENT Microscopic Comment The core biopsies are of a neoplastic cells with vesicular chromatin and scant cytoplasm. Numerous mitoses and apoptotic debris is noted. By immunohistochemistry, the neoplastic cells are positive for CD20, bcl-2, bcl-6 but negative for CD3, CD5, and CD10. The proliferative rate by ki-67 is 80-90%. Overall, the findings are consistent with a high grade B-cell lymphoma. However, additional immunohistochemistry (mum-1, C-myc and EBV) and FISH (Triple Hit panel) are pending to better classify this lymphoma. Preliminary results were discussed with Dr. Burr Medico. An initial panel of immunohistochemistry was performed prior to hematopathology consultation. The neoplastic cells were negative for CK7, CK20, TTF-1, Napsin-A, GATA-3, qualitative ER, PAX-8, CDX-2, and Melan-A. Specimen Gross and Clinical Information Specimen(s) Obtained: Lymph node, needle/core biopsy, Left  Axilla Specimen Clinical Information Chest nodules with extensive left axillary lymphadenopathy; concern for metastatic disease (nt) Gross The specimen is received in saline and consists of multiple core and core fragments of tan white soft tissue, ranging from 0.2 cm to 1.5 cm in length x 0.1 cm in diameter. A representative core is placed in RPMI for possible flow cytometry, and the remaining tissue is entirely submitted in one cassette. (KL:kh 08/30/18) Stain(s) used in Diagnosis: The following stain(s) were used in diagnosing the case: CDX-2, CK8/18 (LMW), Thyroid Transcription Factor -1, GATA-3, CK-7, CD-10, CD 5, KI-67-NO ACIS, Napsin-A, BCl 2, CD 3, *MELAN A Bond (Red), BCl 6 , CD 20, CK 20, ER - NOACIS, CK AE1AE3, PAX 8. The control(s) stained appropriately.   RADIOGRAPHIC STUDIES: I have personally reviewed the radiological images as listed and agreed with the findings in the report. No results found.   09/07/2018 CT Head IMPRESSION: 1.  No evidence of acute intracranial abnormality. 2. Mild chronic small vessel ischemic changes in the cerebral white matter. 3. Mild chronic appearing paranasal sinusitis.  09/07/2018 CT Angio Chest IMPRESSION: 1. No evidence of pulmonary embolism. 2. Progression of lymphoma since the prior CTA on 11/25 with enlargement of left axillary, subpectoral and chest wall lymph node masses with encasement of left subclavian and axillary vascular structures. Enlargement of right hilar and mediastinal lymph nodes since the prior study. Pulmonary involvement also shows significant progression with enlargement of all of the bilateral pulmonary nodule seen previously. 3. Normal arterial patency demonstrated of the left subclavian and axillary arteries. Venous patency cannot be determined on the CTA study and the patient would be at high risk of left upper extremity DVT given the large bulky lymphadenopathy present. 4. New small right pleural effusion.  Stable small to moderate left pleural effusion. 5. Stable splenomegaly  09/30/2018 CT AP IMPRESSION: 1. Bulky pelvic sidewall lymphadenopathy with borderline to mild lymphadenopathy in the hepato duodenal ligament and para-aortic retroperitoneal space. Lymphoma or metastatic disease would be primary considerations. 2. Hepatomegaly with borderline splenomegaly. Prominent portal and splenic veins raise the question of portal venous hypertension. 3. Gallbladder sludge or tiny stones. 4. Left chest wall and upper abdominal wall edema. 5. Large hiatal hernia with bilateral pleural effusions, similar to prior. 6.  Aortic Atherosclerois (ICD10-170.0)  09/28/2018 CT Angio Chest IMPRESSION: 1. No acute large central pulmonary embolus, aortic aneurysm or dissection. 2. Multilobar masslike opacities scattered throughout both lungs as above described, the largest in the lingula measuring up to 3.3 cm, presumed metastatic given left axillary adenopathy also noted. 3. Left axillary nodal enlargement measuring up to 2.8 cm short axis. 4. Asymmetric soft tissue enlargement of the left pectoralis muscle suspicious for intramuscular  hemorrhage and edema given reported rapid onset of left arm swelling and clinical report of low hemoglobin. An infiltrating mass or metastasis is also a differential considerations given local adenopathy, asymmetric subcutaneous edema of the partially included left breast with thickening of the skin concerning for possible inflammatory carcinoma of the breast. Clinical correlation is therefore recommended as well as mammographic correlation. 5. Moderate left and trace right pleural effusions with compressive atelectasis.  ASSESSMENT & PLAN:  REBAKAH COKLEY is a 73 y.o. female with history of  1. Stage IV diffuse large B-Cell Lymphoma with diffuse lymphadenopathy and lung involvement, stage IV, IPI high risk  -Diagnosed in 08/2018. Currently on R-CHOP, started  on 09/09/2018, she received the first two cycle in hospital. Tolerating well overall. -She developed Pseudomonas UTI and bacteremia, when she was neutropenic after chemotherapy.  She is at high risk for recurrent infection. -Labs reviewed, CBC showed WBC 0.9K Hg 10.3 Neutro Abs 0.3K. CMP showed Na 131. -We will give Udenyca  Injection today -I will give for lactic antibiotics Cipro 500 mg twice daily for 5 days -f/u in 2 weeks for cycle 3 chemo  -Plan to have restaging PET scan after next cycle chemotherapy  2. Autoimmune Hemolytic Anemia -Coombs +. Required multiple blood transfusions. -she is on tapering dose prednisone, will completed on 1/11  -Labs reviewed, CBC showed Hg 10.3, improved.   3. 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 left ptosis and dilated right pupil  -chronic and stable   5. Sacral pressure ulcers  -f/u with wound care -I asked her to take a picture of the wound so I can assess the severity. The wound was covered today. -she is on hydrocodone as needed for the pain   Plan  -Udenyca injection today, I also called in prophylactic antibiotics Cipro 500 mg twice daily for 5 days  -f/u in 2 weeks with labs and chemo. Will request a bed for her treatment -I spoke with her primary care physician Dr. Deforest Hoyles today   No problem-specific Assessment & Plan notes found for this encounter.   No orders of the defined types were placed in this encounter.  All questions were answered. The patient knows to call the clinic with any problems, questions or concerns. No barriers to learning was detected. I spent 20 minutes counseling the patient face to face. The total time spent in the appointment was 25 minutes and more than 50% was on counseling and review of test results  I, Noor Dweik am acting as scribe for Dr. Truitt Merle.  I have reviewed the above documentation for accuracy and completeness, and I agree with the above.     Truitt Merle, MD 10/11/2018

## 2018-10-11 ENCOUNTER — Other Ambulatory Visit: Payer: Self-pay | Admitting: *Deleted

## 2018-10-11 ENCOUNTER — Inpatient Hospital Stay (HOSPITAL_BASED_OUTPATIENT_CLINIC_OR_DEPARTMENT_OTHER): Payer: Medicare HMO | Admitting: Hematology

## 2018-10-11 ENCOUNTER — Inpatient Hospital Stay: Payer: Medicare HMO

## 2018-10-11 ENCOUNTER — Encounter: Payer: Self-pay | Admitting: Hematology

## 2018-10-11 ENCOUNTER — Inpatient Hospital Stay: Payer: Medicare HMO | Attending: Hematology

## 2018-10-11 VITALS — BP 119/88 | HR 70 | Temp 98.8°F | Resp 18 | Ht 66.0 in | Wt 146.8 lb

## 2018-10-11 DIAGNOSIS — R000 Tachycardia, unspecified: Secondary | ICD-10-CM | POA: Diagnosis not present

## 2018-10-11 DIAGNOSIS — Z5111 Encounter for antineoplastic chemotherapy: Secondary | ICD-10-CM | POA: Diagnosis not present

## 2018-10-11 DIAGNOSIS — C799 Secondary malignant neoplasm of unspecified site: Secondary | ICD-10-CM

## 2018-10-11 DIAGNOSIS — R918 Other nonspecific abnormal finding of lung field: Secondary | ICD-10-CM | POA: Diagnosis not present

## 2018-10-11 DIAGNOSIS — D701 Agranulocytosis secondary to cancer chemotherapy: Secondary | ICD-10-CM | POA: Insufficient documentation

## 2018-10-11 DIAGNOSIS — D591 Other autoimmune hemolytic anemias: Secondary | ICD-10-CM | POA: Insufficient documentation

## 2018-10-11 DIAGNOSIS — L89159 Pressure ulcer of sacral region, unspecified stage: Secondary | ICD-10-CM | POA: Insufficient documentation

## 2018-10-11 DIAGNOSIS — Z79899 Other long term (current) drug therapy: Secondary | ICD-10-CM | POA: Diagnosis not present

## 2018-10-11 DIAGNOSIS — C8510 Unspecified B-cell lymphoma, unspecified site: Secondary | ICD-10-CM | POA: Diagnosis not present

## 2018-10-11 DIAGNOSIS — Z5112 Encounter for antineoplastic immunotherapy: Secondary | ICD-10-CM | POA: Diagnosis not present

## 2018-10-11 DIAGNOSIS — I878 Other specified disorders of veins: Secondary | ICD-10-CM | POA: Insufficient documentation

## 2018-10-11 DIAGNOSIS — Z452 Encounter for adjustment and management of vascular access device: Secondary | ICD-10-CM

## 2018-10-11 DIAGNOSIS — Z87891 Personal history of nicotine dependence: Secondary | ICD-10-CM | POA: Diagnosis not present

## 2018-10-11 DIAGNOSIS — Z950 Presence of cardiac pacemaker: Secondary | ICD-10-CM | POA: Insufficient documentation

## 2018-10-11 DIAGNOSIS — Z5189 Encounter for other specified aftercare: Secondary | ICD-10-CM | POA: Diagnosis not present

## 2018-10-11 DIAGNOSIS — J90 Pleural effusion, not elsewhere classified: Secondary | ICD-10-CM | POA: Insufficient documentation

## 2018-10-11 LAB — CBC WITH DIFFERENTIAL (CANCER CENTER ONLY)
Abs Immature Granulocytes: 0 10*3/uL (ref 0.00–0.07)
Band Neutrophils: 10 %
Basophils Absolute: 0 10*3/uL (ref 0.0–0.1)
Basophils Relative: 3 %
EOS PCT: 0 %
Eosinophils Absolute: 0 10*3/uL (ref 0.0–0.5)
HCT: 31.2 % — ABNORMAL LOW (ref 36.0–46.0)
Hemoglobin: 10.3 g/dL — ABNORMAL LOW (ref 12.0–15.0)
LYMPHS PCT: 14 %
Lymphs Abs: 0.1 10*3/uL — ABNORMAL LOW (ref 0.7–4.0)
MCH: 28.4 pg (ref 26.0–34.0)
MCHC: 33 g/dL (ref 30.0–36.0)
MCV: 86 fL (ref 80.0–100.0)
Metamyelocytes Relative: 3 %
Monocytes Absolute: 0.4 10*3/uL (ref 0.1–1.0)
Monocytes Relative: 43 %
Myelocytes: 1 %
Neutro Abs: 0.3 10*3/uL — CL (ref 1.7–17.7)
Neutrophils Relative %: 26 %
Platelet Count: 228 10*3/uL (ref 150–400)
RBC: 3.63 MIL/uL — ABNORMAL LOW (ref 3.87–5.11)
RDW: 19.5 % — ABNORMAL HIGH (ref 11.5–15.5)
WBC Count: 0.9 10*3/uL — CL (ref 4.0–10.5)
nRBC: 0 % (ref 0.0–0.2)

## 2018-10-11 LAB — CMP (CANCER CENTER ONLY)
ALT: 19 U/L (ref 0–44)
AST: 9 U/L — ABNORMAL LOW (ref 15–41)
Albumin: 2.7 g/dL — ABNORMAL LOW (ref 3.5–5.0)
Alkaline Phosphatase: 89 U/L (ref 38–126)
Anion gap: 12 (ref 5–15)
BUN: 13 mg/dL (ref 8–23)
CO2: 20 mmol/L — ABNORMAL LOW (ref 22–32)
Calcium: 9 mg/dL (ref 8.9–10.3)
Chloride: 99 mmol/L (ref 98–111)
Creatinine: 0.58 mg/dL (ref 0.44–1.00)
GFR, Est AFR Am: >60 mL/min (ref >60)
GFR, Estimated: >60 mL/min (ref >60)
GLUCOSE: 111 mg/dL — AB (ref 70–99)
Potassium: 4.2 mmol/L (ref 3.5–5.1)
Sodium: 131 mmol/L — ABNORMAL LOW (ref 135–145)
TOTAL PROTEIN: 6 g/dL — AB (ref 6.5–8.1)
Total Bilirubin: 2.5 mg/dL — ABNORMAL HIGH (ref 0.3–1.2)

## 2018-10-11 LAB — LACTATE DEHYDROGENASE: LDH: 193 U/L — ABNORMAL HIGH (ref 98–192)

## 2018-10-11 MED ORDER — SODIUM CHLORIDE 0.9% FLUSH
10.0000 mL | INTRAVENOUS | Status: DC | PRN
  Administered 2018-10-11: 10 mL
  Filled 2018-10-11: qty 10

## 2018-10-11 MED ORDER — PEGFILGRASTIM-CBQV 6 MG/0.6ML ~~LOC~~ SOSY
PREFILLED_SYRINGE | SUBCUTANEOUS | Status: AC
  Filled 2018-10-11: qty 0.6

## 2018-10-11 MED ORDER — OXYCODONE-ACETAMINOPHEN 5-325 MG PO TABS
0.5000 | ORAL_TABLET | Freq: Once | ORAL | Status: AC
  Administered 2018-10-11: 0.5 via ORAL

## 2018-10-11 MED ORDER — OXYCODONE-ACETAMINOPHEN 5-325 MG PO TABS
ORAL_TABLET | ORAL | Status: AC
  Filled 2018-10-11: qty 1

## 2018-10-11 MED ORDER — PEGFILGRASTIM INJECTION 6 MG/0.6ML ~~LOC~~
6.0000 mg | PREFILLED_SYRINGE | Freq: Once | SUBCUTANEOUS | Status: AC
  Administered 2018-10-11: 6 mg via SUBCUTANEOUS

## 2018-10-11 MED ORDER — PEGFILGRASTIM INJECTION 6 MG/0.6ML ~~LOC~~
PREFILLED_SYRINGE | SUBCUTANEOUS | Status: AC
  Filled 2018-10-11: qty 0.6

## 2018-10-11 MED ORDER — CIPROFLOXACIN HCL 500 MG PO TABS: 500.0000 mg | ORAL_TABLET | Freq: Two times a day (BID) | ORAL | 0 refills | Status: AC

## 2018-10-11 MED ORDER — HEPARIN SOD (PORK) LOCK FLUSH 100 UNIT/ML IV SOLN
500.0000 [IU] | Freq: Once | INTRAVENOUS | Status: AC | PRN
  Administered 2018-10-11: 500 [IU]
  Filled 2018-10-11: qty 5

## 2018-10-11 NOTE — Progress Notes (Signed)
VO from MD Burr Medico to admin 0.5 percocet tablet PO at d/c for bedsore associated pain without later pain reassessment by RN.  Pt WC with belongings and relative to scheduling.  Verbalized understanding to call with any questions/concerns/worsening pain.

## 2018-10-11 NOTE — Patient Instructions (Signed)
Pegfilgrastim injection  What is this medicine?  PEGFILGRASTIM (PEG fil gra stim) is a long-acting granulocyte colony-stimulating factor that stimulates the growth of neutrophils, a type of white blood cell important in the body's fight against infection. It is used to reduce the incidence of fever and infection in patients with certain types of cancer who are receiving chemotherapy that affects the bone marrow, and to increase survival after being exposed to high doses of radiation.  This medicine may be used for other purposes; ask your health care provider or pharmacist if you have questions.  COMMON BRAND NAME(S): Fulphila, Neulasta, UDENYCA  What should I tell my health care provider before I take this medicine?  They need to know if you have any of these conditions:  -kidney disease  -latex allergy  -ongoing radiation therapy  -sickle cell disease  -skin reactions to acrylic adhesives (On-Body Injector only)  -an unusual or allergic reaction to pegfilgrastim, filgrastim, other medicines, foods, dyes, or preservatives  -pregnant or trying to get pregnant  -breast-feeding  How should I use this medicine?  This medicine is for injection under the skin. If you get this medicine at home, you will be taught how to prepare and give the pre-filled syringe or how to use the On-body Injector. Refer to the patient Instructions for Use for detailed instructions. Use exactly as directed. Tell your healthcare provider immediately if you suspect that the On-body Injector may not have performed as intended or if you suspect the use of the On-body Injector resulted in a missed or partial dose.  It is important that you put your used needles and syringes in a special sharps container. Do not put them in a trash can. If you do not have a sharps container, call your pharmacist or healthcare provider to get one.  Talk to your pediatrician regarding the use of this medicine in children. While this drug may be prescribed for  selected conditions, precautions do apply.  Overdosage: If you think you have taken too much of this medicine contact a poison control center or emergency room at once.  NOTE: This medicine is only for you. Do not share this medicine with others.  What if I miss a dose?  It is important not to miss your dose. Call your doctor or health care professional if you miss your dose. If you miss a dose due to an On-body Injector failure or leakage, a new dose should be administered as soon as possible using a single prefilled syringe for manual use.  What may interact with this medicine?  Interactions have not been studied.  Give your health care provider a list of all the medicines, herbs, non-prescription drugs, or dietary supplements you use. Also tell them if you smoke, drink alcohol, or use illegal drugs. Some items may interact with your medicine.  This list may not describe all possible interactions. Give your health care provider a list of all the medicines, herbs, non-prescription drugs, or dietary supplements you use. Also tell them if you smoke, drink alcohol, or use illegal drugs. Some items may interact with your medicine.  What should I watch for while using this medicine?  You may need blood work done while you are taking this medicine.  If you are going to need a MRI, CT scan, or other procedure, tell your doctor that you are using this medicine (On-Body Injector only).  What side effects may I notice from receiving this medicine?  Side effects that you should report to   your doctor or health care professional as soon as possible:  -allergic reactions like skin rash, itching or hives, swelling of the face, lips, or tongue  -back pain  -dizziness  -fever  -pain, redness, or irritation at site where injected  -pinpoint red spots on the skin  -red or dark-brown urine  -shortness of breath or breathing problems  -stomach or side pain, or pain at the shoulder  -swelling  -tiredness  -trouble passing urine or  change in the amount of urine  Side effects that usually do not require medical attention (report to your doctor or health care professional if they continue or are bothersome):  -bone pain  -muscle pain  This list may not describe all possible side effects. Call your doctor for medical advice about side effects. You may report side effects to FDA at 1-800-FDA-1088.  Where should I keep my medicine?  Keep out of the reach of children.  If you are using this medicine at home, you will be instructed on how to store it. Throw away any unused medicine after the expiration date on the label.  NOTE: This sheet is a summary. It may not cover all possible information. If you have questions about this medicine, talk to your doctor, pharmacist, or health care provider.   2019 Elsevier/Gold Standard (2017-12-27 16:57:08)

## 2018-10-12 ENCOUNTER — Telehealth: Payer: Self-pay | Admitting: Hematology

## 2018-10-12 ENCOUNTER — Ambulatory Visit: Payer: Medicare HMO

## 2018-10-12 ENCOUNTER — Other Ambulatory Visit: Payer: Medicare HMO

## 2018-10-12 ENCOUNTER — Ambulatory Visit: Payer: Medicare HMO | Admitting: Hematology

## 2018-10-12 DIAGNOSIS — C8510 Unspecified B-cell lymphoma, unspecified site: Secondary | ICD-10-CM | POA: Diagnosis not present

## 2018-10-12 DIAGNOSIS — I10 Essential (primary) hypertension: Secondary | ICD-10-CM | POA: Diagnosis not present

## 2018-10-12 DIAGNOSIS — A4152 Sepsis due to Pseudomonas: Secondary | ICD-10-CM | POA: Diagnosis not present

## 2018-10-12 DIAGNOSIS — L89150 Pressure ulcer of sacral region, unstageable: Secondary | ICD-10-CM | POA: Diagnosis not present

## 2018-10-12 DIAGNOSIS — L89153 Pressure ulcer of sacral region, stage 3: Secondary | ICD-10-CM | POA: Diagnosis not present

## 2018-10-12 NOTE — Telephone Encounter (Signed)
Per 1/07 los scheduled patient lab, flush and f/u.  Added treatment to the book, and stated the patient needs a bed.

## 2018-10-12 NOTE — Telephone Encounter (Signed)
Called patient to let patient know her appointment time.  Left a voicemail.  Printed and mailed calendar.

## 2018-10-13 ENCOUNTER — Telehealth: Payer: Self-pay | Admitting: Hematology

## 2018-10-13 ENCOUNTER — Encounter: Payer: Self-pay | Admitting: Hematology

## 2018-10-13 DIAGNOSIS — A4152 Sepsis due to Pseudomonas: Secondary | ICD-10-CM | POA: Diagnosis not present

## 2018-10-13 DIAGNOSIS — L89150 Pressure ulcer of sacral region, unstageable: Secondary | ICD-10-CM | POA: Diagnosis not present

## 2018-10-13 DIAGNOSIS — C8510 Unspecified B-cell lymphoma, unspecified site: Secondary | ICD-10-CM | POA: Diagnosis not present

## 2018-10-13 DIAGNOSIS — E440 Moderate protein-calorie malnutrition: Secondary | ICD-10-CM | POA: Diagnosis not present

## 2018-10-13 NOTE — Telephone Encounter (Signed)
R/s appt per 1/8 sch message - pt daughter is aware of all appts moved to 1/20

## 2018-10-19 DIAGNOSIS — L89150 Pressure ulcer of sacral region, unstageable: Secondary | ICD-10-CM | POA: Diagnosis not present

## 2018-10-19 DIAGNOSIS — I10 Essential (primary) hypertension: Secondary | ICD-10-CM | POA: Diagnosis not present

## 2018-10-19 DIAGNOSIS — E440 Moderate protein-calorie malnutrition: Secondary | ICD-10-CM | POA: Diagnosis not present

## 2018-10-19 DIAGNOSIS — C8510 Unspecified B-cell lymphoma, unspecified site: Secondary | ICD-10-CM | POA: Diagnosis not present

## 2018-10-21 ENCOUNTER — Other Ambulatory Visit: Payer: Self-pay

## 2018-10-21 DIAGNOSIS — C799 Secondary malignant neoplasm of unspecified site: Secondary | ICD-10-CM

## 2018-10-24 ENCOUNTER — Inpatient Hospital Stay: Payer: Medicare HMO

## 2018-10-24 ENCOUNTER — Inpatient Hospital Stay (HOSPITAL_BASED_OUTPATIENT_CLINIC_OR_DEPARTMENT_OTHER): Payer: Medicare HMO | Admitting: Medical

## 2018-10-24 ENCOUNTER — Other Ambulatory Visit: Payer: Self-pay | Admitting: Hematology

## 2018-10-24 VITALS — BP 101/85 | HR 129 | Resp 17 | Ht 66.0 in

## 2018-10-24 VITALS — BP 126/70 | HR 103 | Temp 97.9°F | Resp 16 | Wt 133.0 lb

## 2018-10-24 DIAGNOSIS — Z8261 Family history of arthritis: Secondary | ICD-10-CM | POA: Diagnosis not present

## 2018-10-24 DIAGNOSIS — L89150 Pressure ulcer of sacral region, unstageable: Secondary | ICD-10-CM | POA: Diagnosis not present

## 2018-10-24 DIAGNOSIS — K254 Chronic or unspecified gastric ulcer with hemorrhage: Secondary | ICD-10-CM | POA: Diagnosis present

## 2018-10-24 DIAGNOSIS — C8510 Unspecified B-cell lymphoma, unspecified site: Secondary | ICD-10-CM | POA: Diagnosis not present

## 2018-10-24 DIAGNOSIS — R918 Other nonspecific abnormal finding of lung field: Secondary | ICD-10-CM | POA: Diagnosis not present

## 2018-10-24 DIAGNOSIS — Z452 Encounter for adjustment and management of vascular access device: Secondary | ICD-10-CM | POA: Diagnosis not present

## 2018-10-24 DIAGNOSIS — Z4682 Encounter for fitting and adjustment of non-vascular catheter: Secondary | ICD-10-CM | POA: Diagnosis not present

## 2018-10-24 DIAGNOSIS — Z87891 Personal history of nicotine dependence: Secondary | ICD-10-CM

## 2018-10-24 DIAGNOSIS — J9690 Respiratory failure, unspecified, unspecified whether with hypoxia or hypercapnia: Secondary | ICD-10-CM | POA: Diagnosis not present

## 2018-10-24 DIAGNOSIS — Z8249 Family history of ischemic heart disease and other diseases of the circulatory system: Secondary | ICD-10-CM | POA: Diagnosis not present

## 2018-10-24 DIAGNOSIS — L89159 Pressure ulcer of sacral region, unspecified stage: Secondary | ICD-10-CM | POA: Diagnosis not present

## 2018-10-24 DIAGNOSIS — I427 Cardiomyopathy due to drug and external agent: Secondary | ICD-10-CM | POA: Diagnosis present

## 2018-10-24 DIAGNOSIS — D62 Acute posthemorrhagic anemia: Secondary | ICD-10-CM | POA: Diagnosis present

## 2018-10-24 DIAGNOSIS — R509 Fever, unspecified: Secondary | ICD-10-CM | POA: Diagnosis not present

## 2018-10-24 DIAGNOSIS — Z950 Presence of cardiac pacemaker: Secondary | ICD-10-CM | POA: Diagnosis not present

## 2018-10-24 DIAGNOSIS — L89154 Pressure ulcer of sacral region, stage 4: Secondary | ICD-10-CM | POA: Diagnosis present

## 2018-10-24 DIAGNOSIS — D708 Other neutropenia: Secondary | ICD-10-CM | POA: Diagnosis not present

## 2018-10-24 DIAGNOSIS — R0689 Other abnormalities of breathing: Secondary | ICD-10-CM | POA: Diagnosis not present

## 2018-10-24 DIAGNOSIS — E785 Hyperlipidemia, unspecified: Secondary | ICD-10-CM | POA: Diagnosis present

## 2018-10-24 DIAGNOSIS — Z515 Encounter for palliative care: Secondary | ICD-10-CM | POA: Diagnosis not present

## 2018-10-24 DIAGNOSIS — D61810 Antineoplastic chemotherapy induced pancytopenia: Secondary | ICD-10-CM | POA: Diagnosis present

## 2018-10-24 DIAGNOSIS — R000 Tachycardia, unspecified: Secondary | ICD-10-CM

## 2018-10-24 DIAGNOSIS — C8330 Diffuse large B-cell lymphoma, unspecified site: Secondary | ICD-10-CM | POA: Diagnosis not present

## 2018-10-24 DIAGNOSIS — E1165 Type 2 diabetes mellitus with hyperglycemia: Secondary | ICD-10-CM | POA: Diagnosis not present

## 2018-10-24 DIAGNOSIS — I878 Other specified disorders of veins: Secondary | ICD-10-CM | POA: Diagnosis not present

## 2018-10-24 DIAGNOSIS — J9601 Acute respiratory failure with hypoxia: Secondary | ICD-10-CM | POA: Diagnosis not present

## 2018-10-24 DIAGNOSIS — R6521 Severe sepsis with septic shock: Secondary | ICD-10-CM | POA: Diagnosis not present

## 2018-10-24 DIAGNOSIS — K259 Gastric ulcer, unspecified as acute or chronic, without hemorrhage or perforation: Secondary | ICD-10-CM | POA: Diagnosis not present

## 2018-10-24 DIAGNOSIS — I10 Essential (primary) hypertension: Secondary | ICD-10-CM | POA: Diagnosis not present

## 2018-10-24 DIAGNOSIS — K449 Diaphragmatic hernia without obstruction or gangrene: Secondary | ICD-10-CM | POA: Diagnosis present

## 2018-10-24 DIAGNOSIS — C8338 Diffuse large B-cell lymphoma, lymph nodes of multiple sites: Secondary | ICD-10-CM | POA: Diagnosis not present

## 2018-10-24 DIAGNOSIS — E43 Unspecified severe protein-calorie malnutrition: Secondary | ICD-10-CM | POA: Diagnosis present

## 2018-10-24 DIAGNOSIS — M1990 Unspecified osteoarthritis, unspecified site: Secondary | ICD-10-CM | POA: Diagnosis present

## 2018-10-24 DIAGNOSIS — B371 Pulmonary candidiasis: Secondary | ICD-10-CM | POA: Diagnosis not present

## 2018-10-24 DIAGNOSIS — C799 Secondary malignant neoplasm of unspecified site: Secondary | ICD-10-CM

## 2018-10-24 DIAGNOSIS — K250 Acute gastric ulcer with hemorrhage: Secondary | ICD-10-CM | POA: Diagnosis not present

## 2018-10-24 DIAGNOSIS — Z7189 Other specified counseling: Secondary | ICD-10-CM | POA: Diagnosis not present

## 2018-10-24 DIAGNOSIS — K219 Gastro-esophageal reflux disease without esophagitis: Secondary | ICD-10-CM | POA: Diagnosis present

## 2018-10-24 DIAGNOSIS — K922 Gastrointestinal hemorrhage, unspecified: Secondary | ICD-10-CM | POA: Diagnosis not present

## 2018-10-24 DIAGNOSIS — J811 Chronic pulmonary edema: Secondary | ICD-10-CM | POA: Diagnosis not present

## 2018-10-24 DIAGNOSIS — K9423 Gastrostomy malfunction: Secondary | ICD-10-CM | POA: Diagnosis not present

## 2018-10-24 DIAGNOSIS — R1084 Generalized abdominal pain: Secondary | ICD-10-CM | POA: Diagnosis not present

## 2018-10-24 DIAGNOSIS — K920 Hematemesis: Secondary | ICD-10-CM | POA: Diagnosis present

## 2018-10-24 DIAGNOSIS — D61818 Other pancytopenia: Secondary | ICD-10-CM | POA: Diagnosis not present

## 2018-10-24 DIAGNOSIS — Z66 Do not resuscitate: Secondary | ICD-10-CM | POA: Diagnosis not present

## 2018-10-24 DIAGNOSIS — E559 Vitamin D deficiency, unspecified: Secondary | ICD-10-CM | POA: Diagnosis present

## 2018-10-24 DIAGNOSIS — E870 Hyperosmolality and hypernatremia: Secondary | ICD-10-CM | POA: Diagnosis not present

## 2018-10-24 DIAGNOSIS — A419 Sepsis, unspecified organism: Secondary | ICD-10-CM | POA: Diagnosis not present

## 2018-10-24 DIAGNOSIS — J690 Pneumonitis due to inhalation of food and vomit: Secondary | ICD-10-CM | POA: Diagnosis not present

## 2018-10-24 DIAGNOSIS — Y848 Other medical procedures as the cause of abnormal reaction of the patient, or of later complication, without mention of misadventure at the time of the procedure: Secondary | ICD-10-CM | POA: Diagnosis not present

## 2018-10-24 DIAGNOSIS — J90 Pleural effusion, not elsewhere classified: Secondary | ICD-10-CM | POA: Diagnosis not present

## 2018-10-24 DIAGNOSIS — I442 Atrioventricular block, complete: Secondary | ICD-10-CM | POA: Diagnosis present

## 2018-10-24 DIAGNOSIS — C8332 Diffuse large B-cell lymphoma, intrathoracic lymph nodes: Secondary | ICD-10-CM | POA: Diagnosis present

## 2018-10-24 LAB — CMP (CANCER CENTER ONLY)
ALK PHOS: 191 U/L — AB (ref 38–126)
ALT: 10 U/L (ref 0–44)
AST: 18 U/L (ref 15–41)
Albumin: 2.2 g/dL — ABNORMAL LOW (ref 3.5–5.0)
Anion gap: 11 (ref 5–15)
BUN: 10 mg/dL (ref 8–23)
CALCIUM: 8.2 mg/dL — AB (ref 8.9–10.3)
CO2: 22 mmol/L (ref 22–32)
Chloride: 100 mmol/L (ref 98–111)
Creatinine: 0.53 mg/dL (ref 0.44–1.00)
GFR, Est AFR Am: >60 mL/min (ref >60)
GFR, Estimated: >60 mL/min (ref >60)
Glucose, Bld: 109 mg/dL — ABNORMAL HIGH (ref 70–99)
Potassium: 4 mmol/L (ref 3.5–5.1)
Sodium: 133 mmol/L — ABNORMAL LOW (ref 135–145)
Total Bilirubin: 0.9 mg/dL (ref 0.3–1.2)
Total Protein: 5.6 g/dL — ABNORMAL LOW (ref 6.5–8.1)

## 2018-10-24 LAB — CBC WITH DIFFERENTIAL (CANCER CENTER ONLY)
Abs Immature Granulocytes: 1.34 10*3/uL — ABNORMAL HIGH (ref 0.00–0.07)
Basophils Absolute: 0.1 10*3/uL (ref 0.0–0.1)
Basophils Relative: 1 %
Eosinophils Absolute: 0 10*3/uL (ref 0.0–0.5)
Eosinophils Relative: 0 %
HCT: 29.5 % — ABNORMAL LOW (ref 36.0–46.0)
HEMOGLOBIN: 9.1 g/dL — AB (ref 12.0–15.0)
Immature Granulocytes: 7 %
Lymphocytes Relative: 4 %
Lymphs Abs: 0.9 10*3/uL (ref 0.7–4.0)
MCH: 27.9 pg (ref 26.0–34.0)
MCHC: 30.8 g/dL (ref 30.0–36.0)
MCV: 90.5 fL (ref 80.0–100.0)
MONO ABS: 1.1 10*3/uL — AB (ref 0.1–1.0)
MONOS PCT: 6 %
Neutro Abs: 16.4 10*3/uL — ABNORMAL HIGH (ref 1.7–7.7)
Neutrophils Relative %: 82 %
Platelet Count: 530 10*3/uL — ABNORMAL HIGH (ref 150–400)
RBC: 3.26 MIL/uL — ABNORMAL LOW (ref 3.87–5.11)
RDW: 21 % — ABNORMAL HIGH (ref 11.5–15.5)
WBC: 19.8 10*3/uL — AB (ref 4.0–10.5)
nRBC: 0 % (ref 0.0–0.2)

## 2018-10-24 MED ORDER — SODIUM CHLORIDE 0.9 % IV SOLN: 1250.0000 mg/m2 | Freq: Once | INTRAVENOUS | Status: DC

## 2018-10-24 MED ORDER — DIPHENHYDRAMINE HCL 25 MG PO CAPS
50.0000 mg | ORAL_CAPSULE | Freq: Once | ORAL | Status: AC
  Administered 2018-10-24: 50 mg via ORAL

## 2018-10-24 MED ORDER — SODIUM CHLORIDE 0.9% FLUSH
10.0000 mL | INTRAVENOUS | Status: DC | PRN
  Administered 2018-10-24: 10 mL
  Filled 2018-10-24: qty 10

## 2018-10-24 MED ORDER — DOXORUBICIN HCL CHEMO IV INJECTION 2 MG/ML
83.0000 mg | Freq: Once | INTRAVENOUS | Status: AC
  Administered 2018-10-24: 84 mg via INTRAVENOUS
  Filled 2018-10-24: qty 42

## 2018-10-24 MED ORDER — SODIUM CHLORIDE 0.9 % IV SOLN
Freq: Once | INTRAVENOUS | Status: AC
  Administered 2018-10-24: 12:00:00 via INTRAVENOUS
  Filled 2018-10-24: qty 5

## 2018-10-24 MED ORDER — ACETAMINOPHEN 325 MG PO TABS
ORAL_TABLET | ORAL | Status: AC
  Filled 2018-10-24: qty 2

## 2018-10-24 MED ORDER — PALONOSETRON HCL INJECTION 0.25 MG/5ML
INTRAVENOUS | Status: AC
  Filled 2018-10-24: qty 5

## 2018-10-24 MED ORDER — VINCRISTINE SULFATE CHEMO INJECTION 1 MG/ML
2.0000 mg | Freq: Once | INTRAVENOUS | Status: AC
  Administered 2018-10-24: 2 mg via INTRAVENOUS
  Filled 2018-10-24: qty 2

## 2018-10-24 MED ORDER — SODIUM CHLORIDE 0.9 % IV SOLN
Freq: Once | INTRAVENOUS | Status: AC
  Administered 2018-10-24: 11:00:00 via INTRAVENOUS
  Filled 2018-10-24: qty 250

## 2018-10-24 MED ORDER — ACETAMINOPHEN 325 MG PO TABS
650.0000 mg | ORAL_TABLET | Freq: Once | ORAL | Status: AC
  Administered 2018-10-24: 650 mg via ORAL

## 2018-10-24 MED ORDER — SODIUM CHLORIDE 0.9 % IV SOLN
375.0000 mg/m2 | Freq: Once | INTRAVENOUS | Status: AC
  Administered 2018-10-24: 600 mg via INTRAVENOUS
  Filled 2018-10-24: qty 10

## 2018-10-24 MED ORDER — PALONOSETRON HCL INJECTION 0.25 MG/5ML
0.2500 mg | Freq: Once | INTRAVENOUS | Status: AC
  Administered 2018-10-24: 0.25 mg via INTRAVENOUS

## 2018-10-24 MED ORDER — SODIUM CHLORIDE 0.9 % IV SOLN
750.0000 mg/m2 | Freq: Once | INTRAVENOUS | Status: AC
  Administered 2018-10-24: 1240 mg via INTRAVENOUS
  Filled 2018-10-24: qty 62

## 2018-10-24 MED ORDER — DIPHENHYDRAMINE HCL 25 MG PO CAPS
ORAL_CAPSULE | ORAL | Status: AC
  Filled 2018-10-24: qty 2

## 2018-10-24 MED ORDER — HEPARIN SOD (PORK) LOCK FLUSH 100 UNIT/ML IV SOLN
500.0000 [IU] | Freq: Once | INTRAVENOUS | Status: AC | PRN
  Administered 2018-10-24: 500 [IU]
  Filled 2018-10-24: qty 5

## 2018-10-24 NOTE — Patient Instructions (Addendum)
PICC Removal, Adult, Care After This sheet gives you information about how to care for yourself after your procedure. Your health care provider may also give you more specific instructions. If you have problems or questions, contact your health care provider. What can I expect after the procedure? After your procedure, it is common to have:  Tenderness or soreness.  Redness, swelling, or a scab where the PICC was removed (exit site). Follow these instructions at home: For the first 24 hours after the procedure   Keep the bandage (dressing) on the exit site clean and dry. Do not remove the dressing until your health care provider tells you to do so.  Check your arm often for signs and symptoms of an infection. Check for: ? A red streak that spreads away from the dressing. ? Blood or fluid that you can see on the dressing. ? More redness or swelling.  Do not lift anything heavy or do activities that require great effort until your health care provider says it is okay. You should avoid: ? Lifting weights. ? Yard work. ? Any physical activity with repetitive arm movement.  Watch closely for any signs of an air bubble in the vein (air embolism). This is a rare but serious complication. If you have signs of air embolism, call 911 immediately and lie down on your left side to keep the air from moving into the lungs. Signs of an air embolism include: ? Difficulty breathing. ? Chest pain. ? Coughing or wheezing. ? Skin that is pale, blue, cold, or clammy. ? Rapid pulse. ? Rapid breathing. ? Fainting. After 24 Hours have passed:  Remove your dressing as told by your health care provider. Make sure you wash your hands with soap and water before and after you change the dressing. If soap and water are not available, use hand sanitizer.  Return to your normal activities as told by your health care provider.  A small scab may develop over the exit site. Do not pick at the scab.  When  bathing or showering, gently wash the exit site with soap and water. Pat it dry.  Watch for signs of infection, such as: ? Fever or chills. ? Swollen glands under the arm. ? More redness, swelling, or soreness in the arm. ? Blood, fluid, or pus coming from the exit site. ? Warmth or a bad smell at the exit site. ? A red streak spreading away from the exit site. General instructions  Take over-the-counter and prescription medicines only as told by your health care provider. Do not take any new medicines without checking with your health care provider first.  If you were prescribed an antibiotic medicine, apply or take it as told by your health care provider. Do not stop using the antibiotic even if your condition improves.  Keep all follow-up visits as told by your health care provider. This is important. Contact a health care provider if:  You have a fever or chills.  You have soreness, redness, or swelling on your exit site, and it gets worse.  You have swollen glands under your arm.  You have any of the following symptoms at your exit site: ? Blood, fluid, or pus. ? Unusual warmth. ? A bad smell. ? A red streak spreading away from the exit site. Get help right away if:  You have numbness or tingling in your fingers, hand, or arm.  Your arm looks blue and feels cold.  You have signs of an air embolism, such   A bad smell.  ? A red streak spreading away from the exit site.  Get help right away if:  · You have numbness or tingling in your fingers, hand, or arm.  · Your arm looks blue and feels cold.  · You have signs of an air embolism, such as:  ? Difficulty breathing.  ? Chest pain.  ? Coughing or wheezing.  ? Skin that is pale, blue, cold, or clammy.  ? Rapid pulse.  ? Rapid breathing.  ? Fainting.  These symptoms may represent a serious problem that is an emergency. Do not wait to see if the symptoms will go away. Get medical help right away. Call your local emergency services (911 in the [place] Do not drive yourself to the hospital.  Summary  · After your procedure, it is common to have tenderness or soreness, redness, swelling, or a scab at the exit site.  · Keep the dressing over the exit site clean and dry.  Do not remove the dressing until your health care provider tells you to do so.  · Do not lift anything heavy or do activities that require great effort until your health care provider says it is okay.  · Watch closely for any signs of an air embolism. If you have signs of air embolism, call 911 immediately and lie down on your left side.  This information is not intended to replace advice given to you by your health care provider. Make sure you discuss any questions you have with your health care provider.  Document Released: 09/26/2013 Document Revised: 11/17/2016 Document Reviewed: 11/17/2016  Elsevier Interactive Patient Education © 2019 [employer]

## 2018-10-24 NOTE — Patient Instructions (Signed)

## 2018-10-24 NOTE — Progress Notes (Signed)
Approved to proceed with chemotherapy per Dr. Burr Medico. Sandi Mealy, MHS, PA-C

## 2018-10-24 NOTE — Progress Notes (Addendum)
[facility] Progress Note   Patricia Murillo [id number] [birth year] 73 y.o.  Patricia Murillo is managed by Dr. 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  2) High grade lymphoma: The patient was seen with Dr. Burr Medico with labs reviewed.  We will proceed with cycle 3 day 1 of R-CHOP today.  She will receive peg-filgrastin on 10/26/2018.  She will have restaging CT/PET scans completed prior to her return.  3) Tachycardia: The patient's tachycardia of 130 bpm is believed to be due to pain from sitting on her decubitis ulcer.  4) Venous Access: Her PICC line will be removed after today's infusion.   Please see After Visit Summary for patient specific instructions.  Future Appointments  Date Time Provider [place]  10/26/2018 12:30 PM [facility] [place] FLUSH [facility] None  11/03/2018 11:05 AM [facility] DEVICE REMOTES [facility]  11/29/2018  2:45 PM Evans Lance, MD [facility]    Orders Placed This Encounter  Procedures  . NM PET Image Restage (PS) Whole Body  . CT Abdomen Pelvis W Wo Contrast  . CT Chest W Contrast       Subjective:   Patient ID:  Patricia Murillo is a 73 y.o. (DOB 1946/05/05) female.  Chief Complaint:  Chief Complaint  Patient presents with  . Follow-up    HPI Patricia Murillo is a 73 y.o. female who is managed by Dr. Burr Medico.  She has a high grade lymphoma and presents for Cycle 3 Day 1 of R-CHOP with peg-filgrastin support.  She will receive this treatment through a PICC  line into the RUE.  This has been in place since her initial hospital treatment.  She has a slow healing sacral decub which is managed by wound care.  She is having pain at that site today.  She is eating and drinking well.  She denies fever, chills, or sweats.  Medications: I have reviewed the patient's current medications.  Allergies:  Allergies  Allergen Reactions  . Apixaban Other (See Comments)    Rectal bleeding within 2 days of taking medication    Past Medical History:  Diagnosis Date  . Arthritis   . Hiatal hernia   . History of chicken pox   . Hyperlipidemia   . Pacemaker     Past Surgical History:  Procedure Laterality Date  . ANKLE SURGERY    . PACEMAKER IMPLANT N/A 04/18/2018   Procedure: PACEMAKER IMPLANT;  Surgeon: Evans Lance, MD;  Location: [place] [facility];  Service: Cardiovascular;  Laterality: N/A;    Family History  Problem Relation Age of Onset  . Hypertension Mother   . Arthritis Mother   . Arthritis Father   . Cancer Sister        Breast Cancer  . Heart disease Paternal Uncle     Social History   Socioeconomic History  . Marital status: Divorced    Spouse name: Not on file  . Number of children: 3  . Years of education: 45  . Highest education level: Not on file  Occupational History  . Occupation: Retired  Scientific laboratory technician  . Financial resource strain: Not on file  . Food insecurity:    Worry: Not on file    Inability: Not on file  . Transportation needs:    Medical: Not on file    Non-medical: Not on file  Tobacco Use  . Smoking status: Former Smoker    Last attempt to quit: 10/05/2001    Years since quitting: 17.0  . Smokeless tobacco: Never Used  Substance and Sexual Activity  . Alcohol use: No  . Drug use: No  . Sexual activity: Not Currently  Lifestyle  . Physical activity:    Days per week: Not on file    Minutes per session: Not on file  . Stress: Not on file  Relationships  . Social connections:    Talks on phone:  Not on file    Gets together: Not on file    Attends religious service: Not on file    Active member of club or organization: Not on file    Attends meetings of clubs or organizations: Not on file    Relationship status: Not on file  . Intimate partner violence:    Fear of current or ex partner: Not on file    Emotionally abused: Not on file    Physically abused: Not on file    Forced sexual activity: Not on file  Other Topics Concern  . Not on file  Social History Narrative   Regular exercise-no   Caffeine Use-yes    Past Medical History, Surgical history, Social history, and Family history were reviewed and updated as appropriate.   Please see review of systems for further details on the patient's review from today.   Review of Systems:  Review of Systems  Constitutional: Negative for chills, diaphoresis and fever.  HENT: Negative for trouble swallowing and voice change.   Respiratory: Negative for cough, chest tightness, shortness of breath and wheezing.   Cardiovascular: Negative for chest pain and palpitations.  Gastrointestinal: Negative for abdominal pain, constipation, diarrhea, nausea and vomiting.  Musculoskeletal: Negative for back pain and myalgias.  Skin: Positive for wound (Sacral decub).  Neurological: Negative for dizziness, light-headedness and headaches.    Objective:   Physical Exam:  BP 101/85 (BP Location: Left Arm, Patient Position: Sitting)   Pulse (!) 129 Comment: Liza RN was notified  Resp 17   Ht 5\' 6"  (1.676 m)   SpO2 100%   BMI 23.69 kg/m  ECOG: 1  Physical Exam Constitutional:      General: She is not in acute distress.    Appearance: She is not diaphoretic.  HENT:     Head: Normocephalic and atraumatic.  Cardiovascular:     Rate and Rhythm: Regular rhythm. Tachycardia present.     Heart sounds: Normal heart sounds. No murmur. No friction rub. No gallop.   Pulmonary:     Effort: Pulmonary effort is normal. No respiratory distress.       Breath sounds: Normal breath sounds. No wheezing or rales.  Skin:    Findings: No erythema or rash.     Comments: Patient has a decubitus ulcer in her gluteal cleft.  The wound is pink and is with out bleeding or exudate. The patient has a PICC line in the right medial upper extremity.  Neurological:     Mental Status: She is alert.     Motor: Weakness (The patient has weakness in her left lower extremity.) present.  Gait: Gait abnormal (The patient is ambulating with the use of a wheelchair.).  Psychiatric:        Mood and Affect: Mood normal.        Behavior: Behavior normal.        Thought Content: Thought content normal.        Judgment: Judgment normal.     Lab Review:     Component Value Date/Time   NA 133 (L) 10/24/2018 0845   K 4.0 10/24/2018 0845   CL 100 10/24/2018 0845   CO2 22 10/24/2018 0845   GLUCOSE 109 (H) 10/24/2018 0845   BUN 10 10/24/2018 0845   CREATININE 0.53 10/24/2018 0845   CALCIUM 8.2 (L) 10/24/2018 0845   PROT 5.6 (L) 10/24/2018 0845   ALBUMIN 2.2 (L) 10/24/2018 0845   AST 18 10/24/2018 0845   ALT 10 10/24/2018 0845   ALKPHOS 191 (H) 10/24/2018 0845   BILITOT 0.9 10/24/2018 0845   GFRNONAA >60 10/24/2018 0845   GFRAA >60 10/24/2018 0845       Component Value Date/Time   WBC 19.8 (H) 10/24/2018 0845   WBC 5.7 10/03/2018 0615   RBC 3.26 (L) 10/24/2018 0845   HGB 9.1 (L) 10/24/2018 0845   HCT 29.5 (L) 10/24/2018 0845   HCT 20.9 (L) 09/08/2018 0337   PLT 530 (H) 10/24/2018 0845   MCV 90.5 10/24/2018 0845   MCH 27.9 10/24/2018 0845   MCHC 30.8 10/24/2018 0845   RDW 21.0 (H) 10/24/2018 0845   LYMPHSABS 0.9 10/24/2018 0845   MONOABS 1.1 (H) 10/24/2018 0845   EOSABS 0.0 10/24/2018 0845   BASOSABS 0.1 10/24/2018 0845   -------------------------------  Imaging from last 24 hours (if applicable):  Radiology interpretation: No results found.      This patient was seen with Dr. Burr Medico with my treatment plan reviewed with her. She  expressed agreement with my medical management of this patient.   Addendum I have seen the patient, examined her. I agree with the assessment and and plan and have edited the notes.   Ms Eula Flax is still very weak, with main complaint of the pain from sacral decubitus, has been taking appropriately at the nursing home with dressing change.  No signs of infection.  Her left arm swelling has much improved, the mass in the left upper front chest is not palpable anymore, she has had great response clinically to the chemotherapy.  Lab reviewed, adequate for treatment, will proceed to cycle 3 chemotherapy today.  Plan to get a interim PET scan before cycle 4 chemotherapy.  I will see her back in 7 to 10 days for follow-up.  She will get a Neulasta injection tomorrow.  We will remove the PICC line after chemotherapy today.  Truitt Merle  10/24/2018

## 2018-10-24 NOTE — Progress Notes (Signed)
Adriamycin pushed over 10 minutes, good blood return noted every minute. Patient tolerated well.

## 2018-10-24 NOTE — Progress Notes (Signed)
Patient refuses to weigh in today. Last charted weight is 146 lb. Per patient this is correct. Per Sandi Mealy, PA it is ok to treat today using weight from 10/11/2018.

## 2018-10-24 NOTE — Progress Notes (Signed)
Pt seen by PA Van only, no RN assessment at this time.  PA aware. 

## 2018-10-25 ENCOUNTER — Other Ambulatory Visit: Payer: Self-pay | Admitting: Hematology

## 2018-10-25 ENCOUNTER — Ambulatory Visit: Payer: Medicare HMO

## 2018-10-25 ENCOUNTER — Telehealth: Payer: Self-pay | Admitting: Hematology

## 2018-10-25 DIAGNOSIS — L89150 Pressure ulcer of sacral region, unstageable: Secondary | ICD-10-CM | POA: Diagnosis not present

## 2018-10-25 DIAGNOSIS — C8510 Unspecified B-cell lymphoma, unspecified site: Secondary | ICD-10-CM | POA: Diagnosis not present

## 2018-10-25 DIAGNOSIS — D708 Other neutropenia: Secondary | ICD-10-CM | POA: Diagnosis not present

## 2018-10-25 DIAGNOSIS — I10 Essential (primary) hypertension: Secondary | ICD-10-CM | POA: Diagnosis not present

## 2018-10-25 NOTE — Telephone Encounter (Signed)
Called patient per 1/21 sch message - unable to reach patient - left message for patient with appt date and time -

## 2018-10-26 ENCOUNTER — Encounter: Payer: Self-pay | Admitting: Hematology

## 2018-10-26 ENCOUNTER — Inpatient Hospital Stay: Payer: Medicare HMO

## 2018-10-26 DIAGNOSIS — C8510 Unspecified B-cell lymphoma, unspecified site: Secondary | ICD-10-CM

## 2018-10-26 MED ORDER — PEGFILGRASTIM-CBQV 6 MG/0.6ML ~~LOC~~ SOSY
PREFILLED_SYRINGE | SUBCUTANEOUS | Status: AC
  Filled 2018-10-26: qty 0.6

## 2018-10-26 MED ORDER — PEGFILGRASTIM-CBQV 6 MG/0.6ML ~~LOC~~ SOSY
6.0000 mg | PREFILLED_SYRINGE | Freq: Once | SUBCUTANEOUS | Status: AC
  Administered 2018-10-26: 6 mg via SUBCUTANEOUS

## 2018-10-27 ENCOUNTER — Other Ambulatory Visit: Payer: Self-pay

## 2018-10-27 ENCOUNTER — Inpatient Hospital Stay (HOSPITAL_COMMUNITY)
Admission: EM | Admit: 2018-10-27 | Discharge: 2018-12-04 | DRG: 377 | Disposition: E | Payer: Medicare HMO | Attending: Emergency Medicine | Admitting: Emergency Medicine

## 2018-10-27 ENCOUNTER — Encounter (HOSPITAL_COMMUNITY): Payer: Self-pay

## 2018-10-27 ENCOUNTER — Emergency Department (HOSPITAL_COMMUNITY): Payer: Medicare HMO

## 2018-10-27 DIAGNOSIS — D61810 Antineoplastic chemotherapy induced pancytopenia: Secondary | ICD-10-CM | POA: Diagnosis present

## 2018-10-27 DIAGNOSIS — E559 Vitamin D deficiency, unspecified: Secondary | ICD-10-CM | POA: Diagnosis present

## 2018-10-27 DIAGNOSIS — J811 Chronic pulmonary edema: Secondary | ICD-10-CM

## 2018-10-27 DIAGNOSIS — A419 Sepsis, unspecified organism: Secondary | ICD-10-CM | POA: Diagnosis not present

## 2018-10-27 DIAGNOSIS — C8338 Diffuse large B-cell lymphoma, lymph nodes of multiple sites: Secondary | ICD-10-CM | POA: Diagnosis not present

## 2018-10-27 DIAGNOSIS — E870 Hyperosmolality and hypernatremia: Secondary | ICD-10-CM | POA: Diagnosis not present

## 2018-10-27 DIAGNOSIS — D62 Acute posthemorrhagic anemia: Secondary | ICD-10-CM | POA: Diagnosis present

## 2018-10-27 DIAGNOSIS — E162 Hypoglycemia, unspecified: Secondary | ICD-10-CM | POA: Diagnosis not present

## 2018-10-27 DIAGNOSIS — Z66 Do not resuscitate: Secondary | ICD-10-CM | POA: Diagnosis not present

## 2018-10-27 DIAGNOSIS — Z978 Presence of other specified devices: Secondary | ICD-10-CM

## 2018-10-27 DIAGNOSIS — Y848 Other medical procedures as the cause of abnormal reaction of the patient, or of later complication, without mention of misadventure at the time of the procedure: Secondary | ICD-10-CM | POA: Diagnosis not present

## 2018-10-27 DIAGNOSIS — B371 Pulmonary candidiasis: Secondary | ICD-10-CM | POA: Diagnosis not present

## 2018-10-27 DIAGNOSIS — J9601 Acute respiratory failure with hypoxia: Secondary | ICD-10-CM

## 2018-10-27 DIAGNOSIS — Z0189 Encounter for other specified special examinations: Secondary | ICD-10-CM

## 2018-10-27 DIAGNOSIS — Z8261 Family history of arthritis: Secondary | ICD-10-CM

## 2018-10-27 DIAGNOSIS — K449 Diaphragmatic hernia without obstruction or gangrene: Secondary | ICD-10-CM | POA: Diagnosis present

## 2018-10-27 DIAGNOSIS — J690 Pneumonitis due to inhalation of food and vomit: Secondary | ICD-10-CM | POA: Diagnosis not present

## 2018-10-27 DIAGNOSIS — T451X5A Adverse effect of antineoplastic and immunosuppressive drugs, initial encounter: Secondary | ICD-10-CM | POA: Diagnosis present

## 2018-10-27 DIAGNOSIS — J90 Pleural effusion, not elsewhere classified: Secondary | ICD-10-CM

## 2018-10-27 DIAGNOSIS — K254 Chronic or unspecified gastric ulcer with hemorrhage: Principal | ICD-10-CM | POA: Diagnosis present

## 2018-10-27 DIAGNOSIS — E8771 Transfusion associated circulatory overload: Secondary | ICD-10-CM | POA: Diagnosis not present

## 2018-10-27 DIAGNOSIS — C8510 Unspecified B-cell lymphoma, unspecified site: Secondary | ICD-10-CM | POA: Diagnosis present

## 2018-10-27 DIAGNOSIS — Z515 Encounter for palliative care: Secondary | ICD-10-CM | POA: Diagnosis not present

## 2018-10-27 DIAGNOSIS — K920 Hematemesis: Secondary | ICD-10-CM | POA: Diagnosis present

## 2018-10-27 DIAGNOSIS — I427 Cardiomyopathy due to drug and external agent: Secondary | ICD-10-CM | POA: Diagnosis present

## 2018-10-27 DIAGNOSIS — Z8249 Family history of ischemic heart disease and other diseases of the circulatory system: Secondary | ICD-10-CM | POA: Diagnosis not present

## 2018-10-27 DIAGNOSIS — D701 Agranulocytosis secondary to cancer chemotherapy: Secondary | ICD-10-CM | POA: Diagnosis present

## 2018-10-27 DIAGNOSIS — K922 Gastrointestinal hemorrhage, unspecified: Secondary | ICD-10-CM | POA: Diagnosis not present

## 2018-10-27 DIAGNOSIS — T458X5A Adverse effect of other primarily systemic and hematological agents, initial encounter: Secondary | ICD-10-CM | POA: Diagnosis present

## 2018-10-27 DIAGNOSIS — D61818 Other pancytopenia: Secondary | ICD-10-CM | POA: Diagnosis not present

## 2018-10-27 DIAGNOSIS — E785 Hyperlipidemia, unspecified: Secondary | ICD-10-CM | POA: Diagnosis present

## 2018-10-27 DIAGNOSIS — L89154 Pressure ulcer of sacral region, stage 4: Secondary | ICD-10-CM | POA: Diagnosis present

## 2018-10-27 DIAGNOSIS — L89159 Pressure ulcer of sacral region, unspecified stage: Secondary | ICD-10-CM | POA: Diagnosis not present

## 2018-10-27 DIAGNOSIS — C8332 Diffuse large B-cell lymphoma, intrathoracic lymph nodes: Secondary | ICD-10-CM | POA: Diagnosis present

## 2018-10-27 DIAGNOSIS — Z950 Presence of cardiac pacemaker: Secondary | ICD-10-CM | POA: Diagnosis not present

## 2018-10-27 DIAGNOSIS — Z87891 Personal history of nicotine dependence: Secondary | ICD-10-CM

## 2018-10-27 DIAGNOSIS — R1310 Dysphagia, unspecified: Secondary | ICD-10-CM

## 2018-10-27 DIAGNOSIS — M1990 Unspecified osteoarthritis, unspecified site: Secondary | ICD-10-CM | POA: Diagnosis present

## 2018-10-27 DIAGNOSIS — Z4659 Encounter for fitting and adjustment of other gastrointestinal appliance and device: Secondary | ICD-10-CM

## 2018-10-27 DIAGNOSIS — Z452 Encounter for adjustment and management of vascular access device: Secondary | ICD-10-CM

## 2018-10-27 DIAGNOSIS — J189 Pneumonia, unspecified organism: Secondary | ICD-10-CM

## 2018-10-27 DIAGNOSIS — F419 Anxiety disorder, unspecified: Secondary | ICD-10-CM | POA: Diagnosis not present

## 2018-10-27 DIAGNOSIS — R6521 Severe sepsis with septic shock: Secondary | ICD-10-CM | POA: Diagnosis not present

## 2018-10-27 DIAGNOSIS — C8330 Diffuse large B-cell lymphoma, unspecified site: Secondary | ICD-10-CM | POA: Diagnosis not present

## 2018-10-27 DIAGNOSIS — K219 Gastro-esophageal reflux disease without esophagitis: Secondary | ICD-10-CM | POA: Diagnosis present

## 2018-10-27 DIAGNOSIS — E43 Unspecified severe protein-calorie malnutrition: Secondary | ICD-10-CM

## 2018-10-27 DIAGNOSIS — Z6821 Body mass index (BMI) 21.0-21.9, adult: Secondary | ICD-10-CM

## 2018-10-27 DIAGNOSIS — C8590 Non-Hodgkin lymphoma, unspecified, unspecified site: Secondary | ICD-10-CM | POA: Diagnosis present

## 2018-10-27 DIAGNOSIS — L89150 Pressure ulcer of sacral region, unstageable: Secondary | ICD-10-CM | POA: Diagnosis not present

## 2018-10-27 DIAGNOSIS — E876 Hypokalemia: Secondary | ICD-10-CM | POA: Diagnosis not present

## 2018-10-27 DIAGNOSIS — Z803 Family history of malignant neoplasm of breast: Secondary | ICD-10-CM

## 2018-10-27 DIAGNOSIS — R0602 Shortness of breath: Secondary | ICD-10-CM

## 2018-10-27 DIAGNOSIS — I959 Hypotension, unspecified: Secondary | ICD-10-CM | POA: Diagnosis not present

## 2018-10-27 DIAGNOSIS — R509 Fever, unspecified: Secondary | ICD-10-CM | POA: Diagnosis not present

## 2018-10-27 DIAGNOSIS — I442 Atrioventricular block, complete: Secondary | ICD-10-CM | POA: Diagnosis present

## 2018-10-27 DIAGNOSIS — J9690 Respiratory failure, unspecified, unspecified whether with hypoxia or hypercapnia: Secondary | ICD-10-CM

## 2018-10-27 DIAGNOSIS — Z888 Allergy status to other drugs, medicaments and biological substances status: Secondary | ICD-10-CM

## 2018-10-27 DIAGNOSIS — R1312 Dysphagia, oropharyngeal phase: Secondary | ICD-10-CM | POA: Diagnosis not present

## 2018-10-27 DIAGNOSIS — Z7189 Other specified counseling: Secondary | ICD-10-CM | POA: Diagnosis not present

## 2018-10-27 DIAGNOSIS — I10 Essential (primary) hypertension: Secondary | ICD-10-CM | POA: Diagnosis present

## 2018-10-27 HISTORY — DX: Non-Hodgkin lymphoma, unspecified, unspecified site: C85.90

## 2018-10-27 LAB — MRSA PCR SCREENING: MRSA by PCR: NEGATIVE

## 2018-10-27 LAB — CBC
HCT: 16.4 % — ABNORMAL LOW (ref 36.0–46.0)
Hemoglobin: 5 g/dL — CL (ref 12.0–15.0)
MCH: 29.4 pg (ref 26.0–34.0)
MCHC: 30.5 g/dL (ref 30.0–36.0)
MCV: 96.5 fL (ref 80.0–100.0)
Platelets: 288 10*3/uL (ref 150–400)
RBC: 1.7 MIL/uL — ABNORMAL LOW (ref 3.87–5.11)
RDW: 20.7 % — ABNORMAL HIGH (ref 11.5–15.5)
WBC: 74.9 10*3/uL (ref 4.0–10.5)
nRBC: 0 % (ref 0.0–0.2)

## 2018-10-27 LAB — COMPREHENSIVE METABOLIC PANEL
ALT: 10 U/L (ref 0–44)
AST: 17 U/L (ref 15–41)
Albumin: 1.9 g/dL — ABNORMAL LOW (ref 3.5–5.0)
Alkaline Phosphatase: 122 U/L (ref 38–126)
Anion gap: 10 (ref 5–15)
BUN: 29 mg/dL — ABNORMAL HIGH (ref 8–23)
CO2: 16 mmol/L — ABNORMAL LOW (ref 22–32)
Calcium: 7.7 mg/dL — ABNORMAL LOW (ref 8.9–10.3)
Chloride: 107 mmol/L (ref 98–111)
Creatinine, Ser: 0.45 mg/dL (ref 0.44–1.00)
GFR calc Af Amer: >60 mL/min (ref >60)
GFR calc non Af Amer: >60 mL/min (ref >60)
Glucose, Bld: 230 mg/dL — ABNORMAL HIGH (ref 70–99)
Potassium: 4.6 mmol/L (ref 3.5–5.1)
Sodium: 133 mmol/L — ABNORMAL LOW (ref 135–145)
Total Bilirubin: 0.8 mg/dL (ref 0.3–1.2)
Total Protein: 4.3 g/dL — ABNORMAL LOW (ref 6.5–8.1)

## 2018-10-27 LAB — PREPARE RBC (CROSSMATCH)

## 2018-10-27 LAB — POC OCCULT BLOOD, ED: Fecal Occult Bld: POSITIVE — AB

## 2018-10-27 LAB — LACTIC ACID, PLASMA: Lactic Acid, Venous: 3.5 mmol/L (ref 0.5–1.9)

## 2018-10-27 MED ORDER — SODIUM CHLORIDE 0.9 % IV SOLN: 10.0000 mL/h | Freq: Once | INTRAVENOUS | Status: DC

## 2018-10-27 MED ORDER — ACETAMINOPHEN 160 MG/5ML PO SOLN
650.0000 mg | ORAL | Status: DC | PRN
  Administered 2018-10-27: 650 mg
  Filled 2018-10-27: qty 20.3

## 2018-10-27 MED ORDER — ACETAMINOPHEN 160 MG/5ML PO SOLN
650.0000 mg | ORAL | Status: DC | PRN
  Administered 2018-10-28 (×2): 650 mg via ORAL
  Filled 2018-10-27 (×2): qty 20.3

## 2018-10-27 MED ORDER — SODIUM CHLORIDE 0.9 % IV SOLN
50.0000 ug/h | INTRAVENOUS | Status: DC
  Administered 2018-10-27 – 2018-10-28 (×2): 50 ug/h via INTRAVENOUS
  Filled 2018-10-27 (×5): qty 1

## 2018-10-27 MED ORDER — SODIUM CHLORIDE 0.9 % IV SOLN
80.0000 mg | Freq: Once | INTRAVENOUS | Status: AC
  Administered 2018-10-27: 80 mg via INTRAVENOUS
  Filled 2018-10-27: qty 80

## 2018-10-27 MED ORDER — ONDANSETRON HCL 4 MG/2ML IJ SOLN
4.0000 mg | Freq: Once | INTRAMUSCULAR | Status: AC
  Administered 2018-10-27: 4 mg via INTRAVENOUS
  Filled 2018-10-27: qty 2

## 2018-10-27 MED ORDER — SODIUM CHLORIDE 0.9 % IV BOLUS: 2000.0000 mL | Freq: Once | INTRAVENOUS | Status: DC

## 2018-10-27 MED ORDER — SODIUM CHLORIDE 0.9 % IV SOLN
INTRAVENOUS | Status: DC
  Administered 2018-10-28: 10:00:00 via INTRAVENOUS

## 2018-10-27 MED ORDER — SODIUM CHLORIDE 0.9 % IV BOLUS
1000.0000 mL | Freq: Once | INTRAVENOUS | Status: AC
  Administered 2018-10-27: 1000 mL via INTRAVENOUS

## 2018-10-27 MED ORDER — OCTREOTIDE LOAD VIA INFUSION
100.0000 ug | Freq: Once | INTRAVENOUS | Status: AC
  Administered 2018-10-27: 100 ug via INTRAVENOUS
  Filled 2018-10-27: qty 50

## 2018-10-27 MED ORDER — TRANEXAMIC ACID-NACL 1000-0.7 MG/100ML-% IV SOLN
1000.0000 mg | Freq: Once | INTRAVENOUS | Status: AC
  Administered 2018-10-27: 1000 mg via INTRAVENOUS
  Filled 2018-10-27: qty 100

## 2018-10-27 MED ORDER — TRANEXAMIC ACID 1000 MG/10ML IV SOLN
1000.0000 mg | Freq: Once | INTRAVENOUS | Status: DC
  Filled 2018-10-27 (×2): qty 10

## 2018-10-27 MED ORDER — SODIUM CHLORIDE 0.9 % IV SOLN
8.0000 mg/h | INTRAVENOUS | Status: DC
  Administered 2018-10-28 – 2018-10-29 (×6): 8 mg/h via INTRAVENOUS
  Filled 2018-10-27 (×11): qty 80

## 2018-10-27 MED ORDER — SODIUM CHLORIDE 0.9 % IV SOLN
INTRAVENOUS | Status: DC
  Administered 2018-10-27 – 2018-10-28 (×2): via INTRAVENOUS

## 2018-10-27 MED ORDER — SODIUM CHLORIDE 0.9% IV SOLUTION
Freq: Once | INTRAVENOUS | Status: AC
  Administered 2018-10-28: via INTRAVENOUS

## 2018-10-27 MED ORDER — SODIUM CHLORIDE 0.9 % IV SOLN
INTRAVENOUS | Status: DC
  Administered 2018-10-28 – 2018-10-29 (×4): via INTRAVENOUS

## 2018-10-27 NOTE — ED Notes (Signed)
ED TO INPATIENT HANDOFF REPORT  Name/Age/Gender Patricia Murillo 73 y.o. female  Code Status    Code Status Orders  (From admission, onward)         Start     Ordered   10/24/2018 1545  Full code  Continuous     11/04/2018 1545        Code Status History    Date Active Date Inactive Code Status Order ID Comments User Context   09/07/2018 1844 10/05/2018 1640 Full Code [id number]  Tawni Millers, MD ED   08/29/2018 1206 09/01/2018 1622 DNR [id number]  Karmen Bongo, MD ED   04/18/2018 0951 04/19/2018 1525 Full Code [id number]  Baldwin Jamaica, PA-C ED      Home/SNF/Other Skilled nursing facility  Chief Complaint emesis  Level of Care/Admitting Diagnosis ED Disposition    ED Disposition Condition [place]: [facility] [100102]  Level of Care: ICU [6]  Diagnosis: GI bleed [397673]  Admitting Physician: Laurin Coder [[id number]]  Attending Physician: Laurin Coder [[id number]]  Estimated length of stay: past midnight tomorrow  Certification:: I certify this patient will need inpatient services for at least 2 midnights  PT Class (Do Not Modify): Inpatient [101]  PT Acc Code (Do Not Modify): Private [1]       Medical History Past Medical History:  Diagnosis Date  . Arthritis   . Hiatal hernia   . History of chicken pox   . Hyperlipidemia   . Non Hodgkin's lymphoma ([place])   . Pacemaker     Allergies Allergies  Allergen Reactions  . Apixaban Other (See Comments)    Rectal bleeding within 2 days of taking medication    IV Location/Drains/Wounds Patient Lines/Drains/Airways Status   Active Line/Drains/Airways    Name:   Placement date:   Placement time:   Site:   Days:   Peripheral IV 10/05/2018 Left Antecubital   10/26/2018    1250    Antecubital   less than 1   Peripheral IV 10/17/2018 Right Antecubital   10/16/2018    1409    Antecubital   less than 1   Pressure Injury 09/21/18 Unstageable - Full thickness  tissue loss in which the base of the ulcer is covered by slough (yellow, tan, gray, green or brown) and/or eschar (tan, brown or black) in the wound bed. pink   09/21/18    2150     36   Wound / Incision (Open or Dehisced) 04/18/18 Incision - Open Chest Right;Upper   04/18/18    1800    Chest   192   Wound / Incision (Open or Dehisced) 04/18/18 Incision - Open Chest Left;Upper   04/18/18    1800    Chest   192   Wound / Incision (Open or Dehisced) 09/21/18 Other (Comment) Labia Left tan coloration   09/21/18    2150    Labia   36          Labs/Imaging Results for orders placed or performed during the hospital encounter of 10/17/2018 (from the past 48 hour(s))  POC occult blood, ED     Status: Abnormal   Collection Time: 10/11/2018  1:20 PM  Result Value Ref Range   Fecal Occult Bld POSITIVE (A) NEGATIVE  Type and screen [place]     Status: None (Preliminary result)   Collection Time: 10/29/2018  1:47 PM  Result Value Ref Range   ABO/RH(D) Jenetta Downer  POS    Antibody Screen NEG    Sample Expiration 10/30/2018    Unit Number [id number]    Blood Component Type RBC, LR IRR    Unit division 00    Status of Unit ALLOCATED    Transfusion Status OK TO TRANSFUSE    Crossmatch Result Compatible    Unit Number [id number]    Blood Component Type RBC, LR IRR    Unit division 00    Status of Unit ISSUED    Transfusion Status OK TO TRANSFUSE    Crossmatch Result      Compatible Performed at [facility] [address] [place], [place] [address]   Lactic acid, plasma     Status: Abnormal   Collection Time: 11/03/2018  1:55 PM  Result Value Ref Range   Lactic Acid, Venous 3.5 (HH) 0.5 - 1.9 mmol/L    Comment: CRITICAL RESULT CALLED TO, READ BACK BY AND VERIFIED WITH: S.BINGHAM AT 1440 ON 10/19/2018 BY N.THOMPSON Performed at [facility], [place] [address], [place], [place] [address]   Comprehensive metabolic panel     Status: Abnormal    Collection Time: 10/08/2018  1:56 PM  Result Value Ref Range   Sodium 133 (L) 135 - 145 mmol/L   Potassium 4.6 3.5 - 5.1 mmol/L   Chloride 107 98 - 111 mmol/L   CO2 16 (L) 22 - 32 mmol/L   Glucose, Bld 230 (H) 70 - 99 mg/dL   BUN 29 (H) 8 - 23 mg/dL   Creatinine, Ser 0.45 0.44 - 1.00 mg/dL   Calcium 7.7 (L) 8.9 - 10.3 mg/dL   Total Protein 4.3 (L) 6.5 - 8.1 g/dL   Albumin 1.9 (L) 3.5 - 5.0 g/dL   AST 17 15 - 41 U/L   ALT 10 0 - 44 U/L   Alkaline Phosphatase 122 38 - 126 U/L   Total Bilirubin 0.8 0.3 - 1.2 mg/dL   GFR calc non Af Amer >60 >60 mL/min   GFR calc Af Amer >60 >60 mL/min   Anion gap 10 5 - 15    Comment: Performed at [facility], [place] [address], [place], [place] [address]  CBC     Status: Abnormal   Collection Time: 10/28/2018  1:56 PM  Result Value Ref Range   WBC 74.9 (HH) 4.0 - 10.5 K/uL    Comment: This critical result has verified and been called to [place]. RN by Sarita Bottom on 01 23 2020 at 1413, and has been read back. CRITICAL RESULT VERIFIED   RBC 1.70 (L) 3.87 - 5.11 MIL/uL   Hemoglobin 5.0 (LL) 12.0 - 15.0 g/dL    Comment: This critical result has verified and been called to [place]. RN by Sarita Bottom on 01 23 2020 at 1413, and has been read back. CRITICAL RESULT VERIFIED   HCT 16.4 (L) 36.0 - 46.0 %   MCV 96.5 80.0 - 100.0 fL   MCH 29.4 26.0 - 34.0 pg   MCHC 30.5 30.0 - 36.0 g/dL   RDW 20.7 (H) 11.5 - 15.5 %   Platelets 288 150 - 400 K/uL   nRBC 0.0 0.0 - 0.2 %    Comment: Performed at [facility], [place] [address] [place], [place] [address]  Prepare RBC     Status: None   Collection Time: 10/30/2018  1:56 PM  Result Value Ref Range   Order Confirmation      ORDER PROCESSED BY BLOOD BANK  Performed at [facility], [place] [address] [place], [place] [address]    Dg Chest Portable 1 View  Result Date: 10/15/2018 CLINICAL DATA:  Vomiting blood today abdominal pain. History of lymphoma.  Chemotherapy 2 days ago. EXAM: PORTABLE CHEST 1 VIEW COMPARISON:  09/21/2018 and 09/17/2018, chest CT 102/14/202019 FINDINGS: Patient is rotated to the left. Lungs are adequately inflated with persistent hazy opacification over the left base/retrocardiac region likely left effusion with associated basilar atelectasis. Infection in the left base is possible. Right lung is clear. Stable moderate cardiomegaly. Large hiatal hernia unchanged. Remainder of the exam is unchanged. IMPRESSION: Opacification over the left base/retrocardiac region without significant change likely small to moderate effusion with associated atelectasis. Infection in the left base is possible. Stable cardiomegaly. Large hiatal hernia. Electronically Signed   By: Marin Olp M.D.   On: 11/01/2018 14:38   None  Pending Labs Unresulted Labs (From admission, onward)    Start     Ordered   10/23/2018 0500  CBC  Tomorrow morning,   R     10/30/2018 1545   10/29/2018 5784  Basic metabolic panel  Tomorrow morning,   R     10/05/2018 1545   10/08/2018 0500  Phosphorus  Tomorrow morning,   R     10/05/2018 1545   10/22/2018 0500  Magnesium  Tomorrow morning,   R     10/14/2018 1545          Vitals/Pain Today's Vitals   10/22/2018 1511 10/10/2018 1516 10/23/2018 1530 10/06/2018 1609  BP:  (!) 94/48 (!) 91/46 (!) 94/57  Pulse:  (!) 102  (!) 108  Resp:  (!) 24  20  Temp: (!) 97.4 F (36.3 C)  (!) 97.4 F (36.3 C)   TempSrc: Oral     SpO2:  100%  99%  PainSc:        Isolation Precautions No active isolations  Medications Medications  octreotide (SANDOSTATIN) 2 mcg/mL load via infusion 100 mcg (100 mcg Intravenous Bolus from Bag 11/03/2018 1407)    And  octreotide (SANDOSTATIN) 500 mcg in sodium chloride 0.9 % 250 mL (2 mcg/mL) infusion (50 mcg/hr Intravenous New Bag/Given 10/17/2018 1339)  0.9 %  sodium chloride infusion (has no administration in time range)  0.9 %  sodium chloride infusion ( Intravenous New Bag/Given 10/22/2018 1338)  tranexamic  acid (CYKLOKAPRON) IVPB 1,000 mg (1,000 mg Intravenous New Bag/Given 10/26/2018 1342)    Followed by  tranexamic acid (CYKLOKAPRON) 1,000 mg in sodium chloride 0.9 % 500 mL infusion (has no administration in time range)  0.9 %  sodium chloride infusion (has no administration in time range)  sodium chloride 0.9 % bolus 2,000 mL (has no administration in time range)  0.9 %  sodium chloride infusion (has no administration in time range)  pantoprazole (PROTONIX) 80 mg in sodium chloride 0.9 % 250 mL (0.32 mg/mL) infusion (has no administration in time range)  pantoprazole (PROTONIX) 80 mg in sodium chloride 0.9 % 100 mL IVPB (80 mg Intravenous New Bag/Given 10/07/2018 1408)  ondansetron (ZOFRAN) injection 4 mg (4 mg Intravenous Given 10/25/2018 1400)  sodium chloride 0.9 % bolus 1,000 mL (0 mLs Intravenous Stopped 10/27/18 1427)    Mobility non-ambulatory

## 2018-10-27 NOTE — Progress Notes (Signed)
eLink Physician-Brief Progress Note Patient Name: Patricia Murillo DOB: 26-Mar-1946 MRN: [id number]   Date of Service  11/04/2018  HPI/Events of Note  Hip discomfort   eICU Interventions  Prn po Tylenol ordered.     Intervention Category Minor Interventions: Routine modifications to care plan (e.g. PRN medications for pain, fever)  Frederik Pear 10/28/2018, 9:49 PM

## 2018-10-27 NOTE — ED Provider Notes (Signed)
[facility] DEPT Provider Note   CSN: [id number] Arrival date & time: 10/22/2018  1238     History   Chief Complaint Chief Complaint  Patient presents with  . Hemoptysis    HPI Patricia Murillo is a 73 y.o. female.  HPI   22yF with sounds like hematemesis. Onset this morning. Hx of  lymphoma.  Describes vomiting and large amount of dark red blood.  No the material mixed in with it.  Feels short of breath.  Some upper abdominal pain.  Dark-colored stool.  Denies any past history of GI bleed.  She is not anticoagulated.  Past Medical History:  Diagnosis Date  . Arthritis   . Hiatal hernia   . History of chicken pox   . Hyperlipidemia   . Non Hodgkin's lymphoma ([place])   . Pacemaker     Patient Active Problem List   Diagnosis Date Noted  . PICC (peripherally inserted central catheter) in place 10/11/2018  . Constipation   . FTT (failure to thrive) in adult   . Pressure injury of skin 09/22/2018  . Bacteremia due to Pseudomonas   . Neutropenic fever ([place])   . Hypoxia   . Hypernatremia 09/14/2018  . Aspiration into airway   . Malnutrition of moderate degree 09/08/2018  . 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 Hypercalcemia   . Respiratory failure ([place]) [id number]  . Lymphoma ([place]) [id number]  . Sepsis ([place])   . 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 Left upper extremity swelling   . Metastatic cancer ([place])   . Hiatal hernia   . GI bleeding 08/29/2018  . Symptomatic anemia 08/29/2018  . Lymphedema of left arm 08/29/2018  . Complete heart block ([place]) 04/18/2018  . Allergic conjunctivitis of left eye 06/25/2017  . Rash of hands 10/12/2016  . Right ankle pain 08/31/2016  . Arthritis involving multiple sites 06/01/2016  . Skin abscess 02/28/2015  . 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 Hyperlipidemia 12/15/2012  . Vitamin D deficiency 12/15/2012  . Arthralgia of wrist 12/15/2012  . Arthralgia  of knee 12/15/2012  . GERD (gastroesophageal reflux disease) 12/15/2012  . Hyperglycemia 09/12/2012    Past Surgical History:  Procedure Laterality Date  . ANKLE SURGERY    . PACEMAKER IMPLANT N/A 04/18/2018   Procedure: PACEMAKER IMPLANT;  Surgeon: Evans Lance, MD;  Location: [facility];  Service: Cardiovascular;  Laterality: N/A;     OB History   No obstetric history on file.      Home Medications    Prior to Admission medications   Medication Sig Start Date End Date Taking? Authorizing Provider  acetaminophen (TYLENOL) 650 MG CR tablet Take 1,300 mg by mouth every 8 (eight) hours as needed for pain.    [provider]  acyclovir (ZOVIRAX) 200 MG capsule Take 2 capsules (400 mg total) by mouth 2 (two) times daily. 10/05/18   Charlynne Cousins, MD  allopurinol (ZYLOPRIM) 300 MG tablet Take 1 tablet (300 mg total) by mouth daily. 09/08/18   Truitt Merle, MD  amLODipine (NORVASC) 5 MG tablet Take 1 tablet (5 mg total) by mouth daily. 10/05/18   Charlynne Cousins, MD  azelastine (OPTIVAR) 0.05 % ophthalmic solution Place 1 drop into both eyes 2 (two) times daily. Patient taking differently: Place 1 drop into both eyes 2 (two) times daily as needed (vertigo).  06/25/17   Golden Circle, FNP  ciprofloxacin (CIPRO) 500 MG tablet Take  1 tablet (500 mg total) by mouth 2 (two) times daily. 10/11/18   Truitt Merle, MD  collagenase (SANTYL) ointment Apply topically daily. 10/05/18   Charlynne Cousins, MD  ferrous gluconate (FERGON) 324 MG tablet Take 1 tablet (324 mg total) by mouth 2 (two) times daily with a meal. 09/01/18   Gherghe, Vella Redhead, MD  HYDROcodone-acetaminophen (NORCO/VICODIN) 5-325 MG tablet Take 1 tablet by mouth every 6 (six) hours as needed for moderate pain. Patient not taking: Reported on 10/11/2018 10/05/18   Charlynne Cousins, MD  meclizine (ANTIVERT) 12.5 MG tablet Take 1 tablet (12.5 mg total) by mouth 3 (three) times daily as needed for  dizziness. Patient not taking: Reported on 10/11/2018 07/15/17   Mesner, Corene Cornea, MD  polyethylene glycol (MIRALAX / GLYCOLAX) packet Take 17 g by mouth daily as needed for moderate constipation. 10/05/18   Charlynne Cousins, MD  Vitamin D, Cholecalciferol, 50 MCG (2000 UT) CAPS Take 2,000 Units by mouth daily.    [provider]    Family History Family History  Problem Relation Age of Onset  . Hypertension Mother   . Arthritis Mother   . Arthritis Father   . Cancer Sister        Breast Cancer  . Heart disease Paternal Uncle     Social History Social History   Tobacco Use  . Smoking status: Former Smoker    Last attempt to quit: 10/05/2001    Years since quitting: 17.0  . Smokeless tobacco: Never Used  Substance Use Topics  . Alcohol use: No  . Drug use: No     Allergies   Apixaban   Review of Systems Review of Systems  All systems reviewed and negative, other than as noted in HPI.  Physical Exam Updated Vital Signs There were no vitals taken for this visit.  Physical Exam Vitals signs and nursing note reviewed.  Constitutional:      General: She is in acute distress.     Appearance: She is well-developed. She is ill-appearing.  HENT:     Head: Normocephalic and atraumatic.     Mouth/Throat:     Comments: Old appearing blood on tongue and lips Eyes:     General:        Right eye: No discharge.        Left eye: No discharge.     Conjunctiva/sclera: Conjunctivae normal.  Neck:     Musculoskeletal: Neck supple.  Cardiovascular:     Rate and Rhythm: Regular rhythm. Tachycardia present.     Heart sounds: Normal heart sounds. No murmur. No friction rub. No gallop.   Pulmonary:     Comments: Tachypnea. Coarse breath sounds b/l.  Abdominal:     General: There is no distension.     Palpations: Abdomen is soft.     Tenderness: There is abdominal tenderness.     Comments: Mild epigastric tenderness  Musculoskeletal:        General: No tenderness.   Skin:    General: Skin is warm and dry.     Coloration: Skin is pale.  Neurological:     Mental Status: She is alert.  Psychiatric:        Behavior: Behavior normal.        Thought Content: Thought content normal.      ED Treatments / Results  Labs (all labs ordered are listed, but only abnormal results are displayed) Labs Reviewed  COMPREHENSIVE METABOLIC PANEL - Abnormal; Notable for the following components:  Result Value   Sodium 133 (*)    CO2 16 (*)    Glucose, Bld 230 (*)    BUN 29 (*)    Calcium 7.7 (*)    Total Protein 4.3 (*)    Albumin 1.9 (*)    All other components within normal limits  CBC - Abnormal; Notable for the following components:   WBC 74.9 (*)    RBC 1.70 (*)    Hemoglobin 5.0 (*)    HCT 16.4 (*)    RDW 20.7 (*)    All other components within normal limits  LACTIC ACID, PLASMA - Abnormal; Notable for the following components:   Lactic Acid, Venous 3.5 (*)    All other components within normal limits  POC OCCULT BLOOD, ED - Abnormal; Notable for the following components:   Fecal Occult Bld POSITIVE (*)    All other components within normal limits  TYPE AND SCREEN  PREPARE RBC (CROSSMATCH)    EKG EKG Interpretation  Date/Time:  Thursday October 27 2018 12:48:41 EST Ventricular Rate:  110 PR Interval:    QRS Duration: 111 QT Interval:  361 QTC Calculation: 489 R Axis:   77 Text Interpretation:  Ventricular-paced complexes No further analysis attempted due to paced rhythm agree. no change Confirmed by Charlesetta Shanks [phone]) on 10/12/2018 2:30:31 PM   Radiology Dg Chest Portable 1 View  Result Date: 11/04/2018 CLINICAL DATA:  Vomiting blood today abdominal pain. History of lymphoma. Chemotherapy 2 days ago. EXAM: PORTABLE CHEST 1 VIEW COMPARISON:  09/21/2018 and 09/17/2018, chest CT 121-[id number] FINDINGS: Patient is rotated to the left. Lungs are adequately inflated with persistent hazy opacification over the left base/retrocardiac  region likely left effusion with associated basilar atelectasis. Infection in the left base is possible. Right lung is clear. Stable moderate cardiomegaly. Large hiatal hernia unchanged. Remainder of the exam is unchanged. IMPRESSION: Opacification over the left base/retrocardiac region without significant change likely small to moderate effusion with associated atelectasis. Infection in the left base is possible. Stable cardiomegaly. Large hiatal hernia. Electronically Signed   By: Marin Olp M.D.   On: 10/22/2018 14:38    Procedures Procedures (including critical care time)  CRITICAL CARE Performed by: Virgel Manifold Total critical care time: 35 minutes Critical care time was exclusive of separately billable procedures and treating other patients. Critical care was necessary to treat or prevent imminent or life-threatening deterioration. Critical care was time spent personally by me on the following activities: development of treatment plan with patient and/or surrogate as well as nursing, discussions with consultants, evaluation of patient's response to treatment, examination of patient, obtaining history from patient or surrogate, ordering and performing treatments and interventions, ordering and review of laboratory studies, ordering and review of radiographic studies, pulse oximetry and re-evaluation of patient's condition.   Medications Ordered in ED Medications  octreotide (SANDOSTATIN) 2 mcg/mL load via infusion 100 mcg (100 mcg Intravenous Bolus from Bag 10/29/2018 1407)    And  octreotide (SANDOSTATIN) 500 mcg in sodium chloride 0.9 % 250 mL (2 mcg/mL) infusion (50 mcg/hr Intravenous New Bag/Given 10/28/2018 1339)  0.9 %  sodium chloride infusion (has no administration in time range)  0.9 %  sodium chloride infusion ( Intravenous New Bag/Given 10/31/2018 1338)  tranexamic acid (CYKLOKAPRON) IVPB 1,000 mg (1,000 mg Intravenous New Bag/Given 11/04/2018 1342)    Followed by  tranexamic acid  (CYKLOKAPRON) 1,000 mg in sodium chloride 0.9 % 500 mL infusion (has no administration in time range)  0.9 %  sodium  chloride infusion (has no administration in time range)  sodium chloride 0.9 % bolus 2,000 mL (has no administration in time range)  pantoprazole (PROTONIX) 80 mg in sodium chloride 0.9 % 100 mL IVPB (80 mg Intravenous New Bag/Given 10/15/2018 1408)  ondansetron (ZOFRAN) injection 4 mg (4 mg Intravenous Given 11/03/2018 1400)  sodium chloride 0.9 % bolus 1,000 mL (0 mLs Intravenous Stopped 10/31/2018 1427)     Initial Impression / Assessment and Plan / ED Course  I have reviewed the triage vital signs and the nursing notes.  Pertinent labs & imaging results that were available during my care of the patient were reviewed by me and considered in my medical decision making (see chart for details).     72yF with UGIB. Concerned that possibly variceal. Doesn't appear to have known history but on CT a/p 2 months ago she had prominent portal and splenic veins raising the question of portal venous hypertension. She also has known lung masses but she coughed several times during my initial examination and didn't bring up any blood.   She is tachycardic and tachypneic. She has two PIVs. Will go ahead and order 2u irradiated PRBC. Octreotide. PPI. Tranexamic acid. GI paged. She reports prior colonoscopy by local provider but cannot remember who. I cannot readily tell from cursory review of records. Will start with on-call provider.   Discussed with patient, son and two daughters that she is critically ill. Suspect severe upper GI bleed. In light of her severe underlying medical problems, I discussed if invasive procedures such as endoscopy, intubation, etc are her wishes. Pt maintains desire to be full code and would want any procedure that may be indicated.   Spoke with [facility]. She is a patient of Dr Oletta Lamas. [facility] paged.   Discussed with Dr Penelope Coop, [facility]. Will see. Discussed with Dr  Nelda Marseille, [place].    Final Clinical Impressions(s) / ED Diagnoses   Final diagnoses:   Acute blood loss anemia Upper GI Bleed  ED Discharge Orders    None       Virgel Manifold, MD 10/31/18 1149

## 2018-10-27 NOTE — ED Notes (Signed)
CRITICAL VALUE STICKER  CRITICAL VALUE: WBC 74.9/ Hgb 5.0  RECEIVER (on-site recipient of call): C. Justeen Hehr, [place] NOTIFIED:  10/06/2018@ 1414  MESSENGER (representative from lab): Nunzio Cory  MD NOTIFIED: Dr. Wilson Singer  TIME OF NOTIFICATION:1415 RESPONSE: Awaiting orders

## 2018-10-27 NOTE — ED Notes (Signed)
Bed: RESB Expected date:  Expected time:  Means of arrival:  Comments: EMS [place] pt, throwing up blood

## 2018-10-27 NOTE — Progress Notes (Signed)
[facility] Progress Note Patient Name: Patricia Murillo DOB: [birth year] MRN: [id number]   Date of Service  10/16/2018  HPI/Events of Note  Order for transfusion of 2 units PRBC accidentally discontinued by the lab  eICU Interventions  2 units PRBC reordered.        Frederik Pear 10/28/2018, 11:42 PM

## 2018-10-27 NOTE — ED Triage Notes (Signed)
Pt arrived via EMS from [facility]. Pt c/o vomiting blood at 12pm today approx. 100cc. Pt is c/o abdominal pain. Pt has a Hx of Lymphoma. Pt had Chemo 2 days ago. Pt appears pale and is cool to touch. Pt wears home O2 2 lpm.  18 G Rt and left ac   V/s 96/56, HR 130, RR 40, O2 sat 96%, CBG 333, Etco 14

## 2018-10-27 NOTE — Consult Note (Signed)
NAME:  Patricia Murillo, MRN:  [id number], DOB:  August 23, 1946, LOS: 0 ADMISSION DATE:  10/19/2018, CONSULTATION DATE:  10/19/2018 REFERRING MD: Dr Wilson Singer , CHIEF COMPLAINT: Hematemesis  Brief History   Patient was brought into the hospital today for hematemesis this afternoon Described about 100 cc of bright red blood, brought in from a nursing home She did bring up a little amount about an hour ago, complaining of nausea  History of present illness   Patient with a history of lymphoma, status post chemotherapy about 2 days ago Extensive disease from recent CT chest from 09/07/2089 She was recently hospitalized for severe anemia, left arm swelling Started [facility] and discharged to skilled nursing facility on 10/05/2018 She was doing well up until today when she had the episode of hematemesis She was not feeling acutely sick, did have associated nausea  Past Medical History   Past Medical History:  Diagnosis Date  . Arthritis   . Hiatal hernia   . History of chicken pox   . Hyperlipidemia   . Non Hodgkin's lymphoma ([place])   . Pacemaker     Significant Hospital Events   Blood pressure was soft earlier, improving  Consults:  [facility] GI Procedures:    Significant Diagnostic Tests:  Last CT chest was 09/07/2018 showing progression of lymphoma, multiple masses and extensive adenopathy CT abdomen 08/31/2018 also did reveal extensive adenopathy Micro Data:  None  Antimicrobials:  None  Interim history/subjective:  Extensive lymphoma on chemotherapy She feels a little more comfortable  Objective   Blood pressure (!) 102/53, pulse (!) 101, temperature 98.4 F (36.9 C), temperature source Rectal, resp. rate (!) 22, SpO2 99 %.        Intake/Output Summary (Last 24 hours) at 10/28/2018 1506 Last data filed at 10/26/2018 1427 Gross per 24 hour  Intake 1000 ml  Output -  Net 1000 ml   There were no vitals filed for this visit. Vitals:   10/28/2018 1516 10/28/2018 1530  BP: (!) 94/48  (!) 91/46  Pulse: (!) 102   Resp: (!) 24   Temp:  (!) 97.4 F (36.3 C)  SpO2: 100%     Examination: General: Elderly lady, pale HENT: Dry oral mucosa Lungs: Fair air entry bilaterally, no rales Cardiovascular: S1-S2 appreciated Abdomen: Bowel sounds appreciated Extremities: No clubbing, no edema Neuro: Awake, alert oriented x3   Resolved Hospital Problem list     Assessment & Plan:   GI bleed -Blood pressure is a little soft at present -She is receiving a blood transfusion  -Not actively expectorating blood  -We will continue to monitor hemodynamics -Continue octreotide -Continue Protonix -Follow-up with GI  Hypotension -Monitor closely -Fluid resuscitation as needed  Extensive lymphoma -Will involve hematology/oncology when stable  Stage IV diffuse B-cell lymphoma -Has had second course of chemotherapy, 2 days ago  History of untreated minimalistic anemia  Sacral pressure ulcers  History of third-degree heart block -Pacemaker  Leukocytosis -Likely related to recent Udenyca injection   Best practice:  Diet: N.p.o. Pain/Anxiety/Delirium protocol (if indicated):  VAP protocol (if indicated): n/a DVT prophylaxis: scd GI prophylaxis: Protonix Glucose control: ssi Mobility: Bedrest Code Status: Full code Family Communication: Family at bedside Disposition: Admit to the ICU Maintain hemodynamic stability We will follow  Labs   CBC: Recent Labs  Lab 10/24/18 0845 10/12/2018 1356  WBC 19.8* 74.9*  NEUTROABS 16.4*  --   HGB 9.1* 5.0*  HCT 29.5* 16.4*  MCV 90.5 96.5  PLT 530* 288  Basic Metabolic Panel: Recent Labs  Lab 10/24/18 0845 11/01/2018 1356  NA 133* 133*  K 4.0 4.6  CL 100 107  CO2 22 16*  GLUCOSE 109* 230*  BUN 10 29*  CREATININE 0.53 0.45  CALCIUM 8.2* 7.7*   GFR: Estimated Creatinine Clearance: 60.4 mL/min (by C-G formula based on SCr of 0.45 mg/dL). Recent Labs  Lab 10/24/18 0845 10/21/2018 1355 10/30/2018 1356  WBC  19.8*  --  74.9*  LATICACIDVEN  --  3.5*  --     Liver Function Tests: Recent Labs  Lab 10/24/18 0845 10/24/2018 1356  AST 18 17  ALT 10 10  ALKPHOS 191* 122  BILITOT 0.9 0.8  PROT 5.6* 4.3*  ALBUMIN 2.2* 1.9*   No results for input(s): LIPASE, AMYLASE in the last 168 hours. No results for input(s): AMMONIA in the last 168 hours.  ABG    Component Value Date/Time   TCO2 27 04/18/2018 0838     Coagulation Profile: No results for input(s): INR, PROTIME in the last 168 hours.  Cardiac Enzymes: No results for input(s): CKTOTAL, CKMB, CKMBINDEX, TROPONINI in the last 168 hours.  HbA1C: Hgb A1c MFr Bld  Date/Time Value Ref Range Status  09/13/2012 11:24 AM 5.3 4.6 - 6.5 % Final    Comment:    Glycemic Control Guidelines for People with Diabetes:Non Diabetic:  <6%Goal of Therapy: <7%Additional Action Suggested:  >8%     CBG: No results for input(s): GLUCAP in the last 168 hours.  Review of Systems:   Review of Systems  Constitutional: Positive for malaise/fatigue.  HENT: Negative.   Eyes: Negative.   Respiratory: Negative.   Cardiovascular: Negative.   Gastrointestinal: Positive for diarrhea and nausea. Negative for constipation.  Genitourinary: Negative.   Musculoskeletal: Negative.   Skin: Negative.   Neurological: Positive for weakness.  Endo/Heme/Allergies: Negative.   Psychiatric/Behavioral: Negative.      Past Medical History  She,  has a past medical history of Arthritis, Hiatal hernia, History of chicken pox, Hyperlipidemia, Non Hodgkin's lymphoma ([place]), and Pacemaker.   Surgical History    Past Surgical History:  Procedure Laterality Date  . ANKLE SURGERY    . PACEMAKER IMPLANT N/A 04/18/2018   Procedure: PACEMAKER IMPLANT;  Surgeon: Evans Lance, MD;  Location: [facility];  Service: Cardiovascular;  Laterality: N/A;     Social History   reports that she quit smoking about 17 years ago. She has never used smokeless tobacco. She reports  that she does not drink alcohol or use drugs.   Family History   Her family history includes Arthritis in her father and mother; Cancer in her sister; Heart disease in her paternal uncle; Hypertension in her mother.   Allergies Allergies  Allergen Reactions  . Apixaban Other (See Comments)    Rectal bleeding within 2 days of taking medication     Home Medications  Prior to Admission medications   Medication Sig Start Date End Date Taking? Authorizing Provider  acetaminophen (TYLENOL) 650 MG CR tablet Take 1,300 mg by mouth every 8 (eight) hours as needed for pain.    [provider]  acyclovir (ZOVIRAX) 200 MG capsule Take 2 capsules (400 mg total) by mouth 2 (two) times daily. 10/05/18   Charlynne Cousins, MD  allopurinol (ZYLOPRIM) 300 MG tablet Take 1 tablet (300 mg total) by mouth daily. 09/08/18   Truitt Merle, MD  amLODipine (NORVASC) 5 MG tablet Take 1 tablet (5 mg total) by mouth daily. 10/05/18  Charlynne Cousins, MD  azelastine (OPTIVAR) 0.05 % ophthalmic solution Place 1 drop into both eyes 2 (two) times daily. Patient taking differently: Place 1 drop into both eyes 2 (two) times daily as needed (vertigo).  06/25/17   Golden Circle, FNP  ciprofloxacin (CIPRO) 500 MG tablet Take 1 tablet (500 mg total) by mouth 2 (two) times daily. 10/11/18   Truitt Merle, MD  collagenase (SANTYL) ointment Apply topically daily. 10/05/18   Charlynne Cousins, MD  ferrous gluconate (FERGON) 324 MG tablet Take 1 tablet (324 mg total) by mouth 2 (two) times daily with a meal. 09/01/18   Gherghe, Vella Redhead, MD  HYDROcodone-acetaminophen (NORCO/VICODIN) 5-325 MG tablet Take 1 tablet by mouth every 6 (six) hours as needed for moderate pain. Patient not taking: Reported on 10/11/2018 10/05/18   Charlynne Cousins, MD  meclizine (ANTIVERT) 12.5 MG tablet Take 1 tablet (12.5 mg total) by mouth 3 (three) times daily as needed for dizziness. Patient not taking: Reported on 10/11/2018 07/15/17   Mesner,  Corene Cornea, MD  polyethylene glycol (MIRALAX / GLYCOLAX) packet Take 17 g by mouth daily as needed for moderate constipation. 10/05/18   Charlynne Cousins, MD  Vitamin D, Cholecalciferol, 50 MCG (2000 UT) CAPS Take 2,000 Units by mouth daily.    [provider]     Critical care time:  35minutes spent revieweing records and formulating plan of care

## 2018-10-27 NOTE — Consult Note (Signed)
Subjective:   HPI  The patient is a 73 year old female with a history of large high-grade B-cell lymphoma stage IV being followed by Dr. Burr Medico from oncology. The patient is undergoing chemotherapy. She came to the emergency room today because this morning she vomited bright red blood. She had several episodes of hematemesis today. She has never had an ulcer to her knowledge. She has never vomited blood before. She was found to have a hemoglobin of 5. She also had melena. At the present time she actually tells me she feels better than she did earlier today. She has been started on a pantoprazole drip. She is also been started on octreotide. She had a scan done back in December of the abdomen showing progression of lymphoma with enlargement of left axillary subpectoral and chest wall lymph nodes with encasement of left subclavian and axillary vascular structures. Enlargement of right hilar and mediastinal lymph nodes since the prior study. Pulmonary involvement also show significant progression with enlargement of all of the bilateral pulmonary nodules previously seen. There was stable splenomegaly.question of portal hypertension.  Review of Systems No chest pain or shortness of breath at this time  Past Medical History:  Diagnosis Date  . Arthritis   . Hiatal hernia   . History of chicken pox   . Hyperlipidemia   . Non Hodgkin's lymphoma ([place])   . Pacemaker    Past Surgical History:  Procedure Laterality Date  . ANKLE SURGERY    . PACEMAKER IMPLANT N/A 04/18/2018   Procedure: PACEMAKER IMPLANT;  Surgeon: Evans Lance, MD;  Location: [facility];  Service: Cardiovascular;  Laterality: N/A;   Social History   Socioeconomic History  . Marital status: Divorced    Spouse name: Not on file  . Number of children: 3  . Years of education: 4  . Highest education level: Not on file  Occupational History  . Occupation: Retired  Scientific laboratory technician  . Financial resource strain: Not on file  .  Food insecurity:    Worry: Not on file    Inability: Not on file  . Transportation needs:    Medical: Not on file    Non-medical: Not on file  Tobacco Use  . Smoking status: Former Smoker    Last attempt to quit: 10/05/2001    Years since quitting: 17.0  . Smokeless tobacco: Never Used  Substance and Sexual Activity  . Alcohol use: No  . Drug use: No  . Sexual activity: Not Currently  Lifestyle  . Physical activity:    Days per week: Not on file    Minutes per session: Not on file  . Stress: Not on file  Relationships  . Social connections:    Talks on phone: Not on file    Gets together: Not on file    Attends religious service: Not on file    Active member of club or organization: Not on file    Attends meetings of clubs or organizations: Not on file    Relationship status: Not on file  . Intimate partner violence:    Fear of current or ex partner: Not on file    Emotionally abused: Not on file    Physically abused: Not on file    Forced sexual activity: Not on file  Other Topics Concern  . Not on file  Social History Narrative   Regular exercise-no   Caffeine Use-yes   family history includes Arthritis in her father and mother; Cancer in her sister;  Heart disease in her paternal uncle; Hypertension in her mother.  Current Facility-Administered Medications:  .  0.9 %  sodium chloride infusion, 10 mL/hr, Intravenous, Once, Virgel Manifold, MD .  0.9 %  sodium chloride infusion, , Intravenous, Continuous, Virgel Manifold, MD, Last Rate: 100 mL/hr at 11/01/2018 1338 .  0.9 %  sodium chloride infusion, 10 mL/hr, Intravenous, Once, Virgel Manifold, MD .  0.9 %  sodium chloride infusion, , Intravenous, Continuous, Olalere, Adewale A, MD .  [COMPLETED] octreotide (SANDOSTATIN) 2 mcg/mL load via infusion 100 mcg, 100 mcg, Intravenous, Once, 100 mcg at 11/04/2018 1407 **AND** octreotide (SANDOSTATIN) 500 mcg in sodium chloride 0.9 % 250 mL (2 mcg/mL) infusion, 50 mcg/hr, Intravenous,  Continuous, Virgel Manifold, MD, Last Rate: 25 mL/hr at 10/14/2018 1339, 50 mcg/hr at 10/26/2018 1339 .  pantoprazole (PROTONIX) 80 mg in sodium chloride 0.9 % 250 mL (0.32 mg/mL) infusion, 8 mg/hr, Intravenous, Continuous, Olalere, Adewale A, MD .  sodium chloride 0.9 % bolus 2,000 mL, 2,000 mL, Intravenous, Once, Virgel Manifold, MD .  Margrett Rud tranexamic acid (CYKLOKAPRON) IVPB 1,000 mg, 1,000 mg, Intravenous, Once, Last Rate: 600 mL/hr at 10/12/2018 1342, 1,000 mg at 10/06/2018 1342 **FOLLOWED BY** tranexamic acid (CYKLOKAPRON) 1,000 mg in sodium chloride 0.9 % 500 mL infusion, 1,000 mg, Intravenous, Once, Virgel Manifold, MD  Current Outpatient Medications:  .  acetaminophen (TYLENOL) 650 MG CR tablet, Take 1,300 mg by mouth every 8 (eight) hours as needed for pain., Disp: , Rfl:  .  acyclovir (ZOVIRAX) 200 MG capsule, Take 2 capsules (400 mg total) by mouth 2 (two) times daily., Disp: , Rfl:  .  allopurinol (ZYLOPRIM) 300 MG tablet, Take 1 tablet (300 mg total) by mouth daily., Disp: 30 tablet, Rfl: 3 .  amLODipine (NORVASC) 5 MG tablet, Take 1 tablet (5 mg total) by mouth daily., Disp: , Rfl:  .  azelastine (OPTIVAR) 0.05 % ophthalmic solution, Place 1 drop into both eyes 2 (two) times daily. (Patient taking differently: Place 1 drop into both eyes 2 (two) times daily as needed (vertigo). ), Disp: 6 mL, Rfl: 1 .  ciprofloxacin (CIPRO) 500 MG tablet, Take 1 tablet (500 mg total) by mouth 2 (two) times daily., Disp: 10 tablet, Rfl: 0 .  collagenase (SANTYL) ointment, Apply topically daily., Disp: 15 g, Rfl: 0 .  ferrous gluconate (FERGON) 324 MG tablet, Take 1 tablet (324 mg total) by mouth 2 (two) times daily with a meal., Disp: 60 tablet, Rfl: 1 .  HYDROcodone-acetaminophen (NORCO/VICODIN) 5-325 MG tablet, Take 1 tablet by mouth every 6 (six) hours as needed for moderate pain. (Patient not taking: Reported on 10/11/2018), Disp: 10 tablet, Rfl: 0 .  meclizine (ANTIVERT) 12.5 MG tablet, Take 1 tablet (12.5  mg total) by mouth 3 (three) times daily as needed for dizziness. (Patient not taking: Reported on 10/11/2018), Disp: 30 tablet, Rfl: 0 .  polyethylene glycol (MIRALAX / GLYCOLAX) packet, Take 17 g by mouth daily as needed for moderate constipation., Disp: 14 each, Rfl: 0 .  Vitamin D, Cholecalciferol, 50 MCG (2000 UT) CAPS, Take 2,000 Units by mouth daily., Disp: , Rfl:  Allergies  Allergen Reactions  . Apixaban Other (See Comments)    Rectal bleeding within 2 days of taking medication     Objective:     BP (!) 91/46   Pulse (!) 102   Temp (!) 97.4 F (36.3 C)   Resp (!) 24   SpO2 100%   Alert and oriented. Pale.  Heart regular rhythm no  murmurs  Lungs clear  Abdomen soft and nontender  Laboratory No components found for: D1    Assessment:     Upper GI bleed  Severe anemia  High-grade B-cell lymphoma stage IV      Plan:     The patient is being admitted to the hospital for further evaluation and treatment. She will be on a pantoprazole IV drip. She will be on octreotide drip in case there is portal hypertension. She is being transfused blood. We will plan to proceed with EGD tomorrow to evaluate the upper GI tract. Lab Results  Component Value Date   HGB 5.0 (LL) 11/01/2018   HGB 9.1 (L) 10/24/2018   HGB 10.3 (L) 10/11/2018   HGB 9.2 (L) 10/03/2018   HGB 9.5 (L) 09/30/2018   HCT 16.4 (L) 10/11/2018   HCT 29.5 (L) 10/24/2018   HCT 31.2 (L) 10/11/2018   HCT 20.9 (L) 09/08/2018   ALKPHOS 122 10/21/2018   ALKPHOS 191 (H) 10/24/2018   ALKPHOS 89 10/11/2018   AST 17 10/17/2018   AST 18 10/24/2018   AST 9 (L) 10/11/2018   AST 24 09/29/2018   AST 17 09/25/2018   ALT 10 10/22/2018   ALT 10 10/24/2018   ALT 19 10/11/2018   ALT 21 09/29/2018   ALT 18 09/25/2018

## 2018-10-27 NOTE — ED Notes (Signed)
Dr Wilson Singer given EKG

## 2018-10-28 ENCOUNTER — Inpatient Hospital Stay (HOSPITAL_COMMUNITY): Payer: Medicare HMO | Admitting: Certified Registered Nurse Anesthetist

## 2018-10-28 ENCOUNTER — Encounter (HOSPITAL_COMMUNITY): Admission: EM | Disposition: E | Payer: Self-pay | Source: Home / Self Care | Attending: Emergency Medicine

## 2018-10-28 ENCOUNTER — Encounter (HOSPITAL_COMMUNITY): Payer: Self-pay | Admitting: *Deleted

## 2018-10-28 ENCOUNTER — Other Ambulatory Visit: Payer: Self-pay

## 2018-10-28 DIAGNOSIS — C8330 Diffuse large B-cell lymphoma, unspecified site: Secondary | ICD-10-CM

## 2018-10-28 DIAGNOSIS — C8338 Diffuse large B-cell lymphoma, lymph nodes of multiple sites: Secondary | ICD-10-CM

## 2018-10-28 HISTORY — PX: BIOPSY: SHX5522

## 2018-10-28 HISTORY — PX: ESOPHAGOGASTRODUODENOSCOPY (EGD) WITH PROPOFOL: SHX5813

## 2018-10-28 LAB — BASIC METABOLIC PANEL
Anion gap: 4 — ABNORMAL LOW (ref 5–15)
BUN: 20 mg/dL (ref 8–23)
CO2: 20 mmol/L — ABNORMAL LOW (ref 22–32)
Calcium: 7.4 mg/dL — ABNORMAL LOW (ref 8.9–10.3)
Chloride: 114 mmol/L — ABNORMAL HIGH (ref 98–111)
Creatinine, Ser: 0.36 mg/dL — ABNORMAL LOW (ref 0.44–1.00)
GFR calc Af Amer: >60 mL/min (ref >60)
GFR calc non Af Amer: >60 mL/min (ref >60)
Glucose, Bld: 111 mg/dL — ABNORMAL HIGH (ref 70–99)
Potassium: 3.8 mmol/L (ref 3.5–5.1)
Sodium: 138 mmol/L (ref 135–145)

## 2018-10-28 LAB — CBC WITH DIFFERENTIAL/PLATELET
Abs Immature Granulocytes: 2.81 10*3/uL — ABNORMAL HIGH (ref 0.00–0.07)
Basophils Absolute: 0.1 10*3/uL (ref 0.0–0.1)
Basophils Relative: 0 %
Eosinophils Absolute: 0.1 10*3/uL (ref 0.0–0.5)
Eosinophils Relative: 0 %
HCT: 24.8 % — ABNORMAL LOW (ref 36.0–46.0)
Hemoglobin: 7.9 g/dL — ABNORMAL LOW (ref 12.0–15.0)
Immature Granulocytes: 11 %
Lymphocytes Relative: 0 %
Lymphs Abs: 0.1 10*3/uL — ABNORMAL LOW (ref 0.7–4.0)
MCH: 29.4 pg (ref 26.0–34.0)
MCHC: 31.9 g/dL (ref 30.0–36.0)
MCV: 92.2 fL (ref 80.0–100.0)
Monocytes Absolute: 0.1 10*3/uL (ref 0.1–1.0)
Monocytes Relative: 0 %
Neutro Abs: 23.1 10*3/uL — ABNORMAL HIGH (ref 1.7–7.7)
Neutrophils Relative %: 89 %
Platelets: 151 10*3/uL (ref 150–400)
RBC: 2.69 MIL/uL — ABNORMAL LOW (ref 3.87–5.11)
RDW: 17.1 % — ABNORMAL HIGH (ref 11.5–15.5)
WBC: 26.3 10*3/uL — AB (ref 4.0–10.5)
nRBC: 0 % (ref 0.0–0.2)

## 2018-10-28 LAB — PHOSPHORUS: Phosphorus: 2.2 mg/dL — ABNORMAL LOW (ref 2.5–4.6)

## 2018-10-28 LAB — MAGNESIUM: Magnesium: 2.3 mg/dL (ref 1.7–2.4)

## 2018-10-28 SURGERY — ESOPHAGOGASTRODUODENOSCOPY (EGD) WITH PROPOFOL
Anesthesia: Monitor Anesthesia Care

## 2018-10-28 MED ORDER — FENTANYL CITRATE (PF) 100 MCG/2ML IJ SOLN
50.0000 ug | INTRAMUSCULAR | Status: DC | PRN
  Administered 2018-10-28 (×6): 50 ug via INTRAVENOUS
  Administered 2018-10-29: 100 ug via INTRAVENOUS
  Administered 2018-10-29: 50 ug via INTRAVENOUS
  Filled 2018-10-28 (×10): qty 2

## 2018-10-28 MED ORDER — SORBITOL 70 % SOLN
960.0000 mL | TOPICAL_OIL | Freq: Once | ORAL | Status: DC
  Filled 2018-10-28: qty 473

## 2018-10-28 MED ORDER — ALLOPURINOL 100 MG PO TABS
300.0000 mg | ORAL_TABLET | Freq: Every day | ORAL | Status: DC
  Administered 2018-10-28: 300 mg via ORAL
  Filled 2018-10-28 (×2): qty 1

## 2018-10-28 MED ORDER — MECLIZINE HCL 12.5 MG PO TABS
12.5000 mg | ORAL_TABLET | Freq: Three times a day (TID) | ORAL | Status: DC | PRN
  Filled 2018-10-28: qty 1

## 2018-10-28 MED ORDER — ACYCLOVIR 400 MG PO TABS
400.0000 mg | ORAL_TABLET | Freq: Two times a day (BID) | ORAL | Status: DC
  Administered 2018-10-28: 400 mg via ORAL
  Filled 2018-10-28 (×22): qty 1

## 2018-10-28 MED ORDER — LIP MEDEX EX OINT
TOPICAL_OINTMENT | CUTANEOUS | Status: AC
  Administered 2018-10-28: 13:00:00
  Filled 2018-10-28: qty 7

## 2018-10-28 MED ORDER — PROMETHAZINE HCL 25 MG/ML IJ SOLN
12.5000 mg | Freq: Four times a day (QID) | INTRAMUSCULAR | Status: DC | PRN
  Administered 2018-10-28 – 2018-10-29 (×2): 12.5 mg via INTRAVENOUS
  Filled 2018-10-28 (×2): qty 1

## 2018-10-28 MED ORDER — CIPROFLOXACIN HCL 500 MG PO TABS
500.0000 mg | ORAL_TABLET | Freq: Two times a day (BID) | ORAL | Status: DC
  Administered 2018-10-28: 500 mg via ORAL
  Filled 2018-10-28: qty 1

## 2018-10-28 MED ORDER — ACETAMINOPHEN 325 MG PO TABS: 975.0000 mg | ORAL_TABLET | Freq: Three times a day (TID) | ORAL | Status: DC | PRN

## 2018-10-28 MED ORDER — COLLAGENASE 250 UNIT/GM EX OINT
TOPICAL_OINTMENT | Freq: Every day | CUTANEOUS | Status: DC
  Administered 2018-10-28: 13:00:00 via TOPICAL
  Administered 2018-10-29: 1 via TOPICAL
  Administered 2018-10-30 – 2018-10-31 (×2): via TOPICAL
  Administered 2018-11-01 – 2018-11-02 (×2): 1 via TOPICAL
  Administered 2018-11-03 – 2018-11-06 (×4): via TOPICAL
  Filled 2018-10-28 (×2): qty 90

## 2018-10-28 MED ORDER — LACTULOSE 10 GM/15ML PO SOLN
20.0000 g | Freq: Once | ORAL | Status: AC
  Administered 2018-10-28: 20 g via ORAL
  Filled 2018-10-28: qty 30

## 2018-10-28 MED ORDER — PROPOFOL 10 MG/ML IV BOLUS
INTRAVENOUS | Status: DC | PRN
  Administered 2018-10-28 (×2): 20 mg via INTRAVENOUS

## 2018-10-28 MED ORDER — AMLODIPINE BESYLATE 5 MG PO TABS
5.0000 mg | ORAL_TABLET | Freq: Every day | ORAL | Status: DC
  Administered 2018-10-28: 5 mg via ORAL
  Filled 2018-10-28: qty 1

## 2018-10-28 MED ORDER — FERROUS GLUCONATE 324 (38 FE) MG PO TABS
324.0000 mg | ORAL_TABLET | Freq: Two times a day (BID) | ORAL | Status: DC
  Filled 2018-10-28 (×2): qty 1

## 2018-10-28 MED ORDER — CHLORHEXIDINE GLUCONATE 0.12 % MT SOLN
15.0000 mL | Freq: Two times a day (BID) | OROMUCOSAL | Status: DC
  Administered 2018-10-29 – 2018-10-30 (×4): 15 mL via OROMUCOSAL

## 2018-10-28 MED ORDER — VITAMIN D3 25 MCG (1000 UNIT) PO TABS
2000.0000 [IU] | ORAL_TABLET | Freq: Every day | ORAL | Status: DC
  Administered 2018-10-28: 2000 [IU] via ORAL
  Filled 2018-10-28: qty 2

## 2018-10-28 MED ORDER — METOCLOPRAMIDE HCL 5 MG/ML IJ SOLN
10.0000 mg | Freq: Once | INTRAMUSCULAR | Status: AC
  Administered 2018-10-28: 10 mg via INTRAVENOUS
  Filled 2018-10-28: qty 2

## 2018-10-28 MED ORDER — PROPOFOL 10 MG/ML IV BOLUS
INTRAVENOUS | Status: AC
  Filled 2018-10-28: qty 40

## 2018-10-28 MED ORDER — SODIUM CHLORIDE 0.9 % IV SOLN: INTRAVENOUS | Status: DC

## 2018-10-28 MED ORDER — SODIUM CHLORIDE 0.9 % IV SOLN
INTRAVENOUS | Status: DC
  Administered 2018-10-28: 500 mL via INTRAVENOUS

## 2018-10-28 MED ORDER — DOCUSATE SODIUM 100 MG PO CAPS
100.0000 mg | ORAL_CAPSULE | Freq: Every day | ORAL | Status: DC
  Administered 2018-10-28: 100 mg via ORAL
  Filled 2018-10-28: qty 1

## 2018-10-28 MED ORDER — ORAL CARE MOUTH RINSE
15.0000 mL | Freq: Two times a day (BID) | OROMUCOSAL | Status: DC
  Administered 2018-10-29 – 2018-10-30 (×3): 15 mL via OROMUCOSAL

## 2018-10-28 MED ORDER — HYDRALAZINE HCL 20 MG/ML IJ SOLN
10.0000 mg | Freq: Four times a day (QID) | INTRAMUSCULAR | Status: DC | PRN
  Administered 2018-10-28 – 2018-10-31 (×3): 10 mg via INTRAVENOUS
  Filled 2018-10-28 (×3): qty 1

## 2018-10-28 MED ORDER — POLYETHYLENE GLYCOL 3350 17 G PO PACK: 17.0000 g | PACK | Freq: Every day | ORAL | Status: DC | PRN

## 2018-10-28 MED ORDER — ONDANSETRON HCL 4 MG/2ML IJ SOLN
4.0000 mg | Freq: Four times a day (QID) | INTRAMUSCULAR | Status: DC | PRN
  Administered 2018-10-28: 4 mg via INTRAVENOUS
  Filled 2018-10-28: qty 2

## 2018-10-28 MED ORDER — FENTANYL 12 MCG/HR TD PT72
1.0000 | MEDICATED_PATCH | TRANSDERMAL | Status: DC
  Administered 2018-10-28: 1 via TRANSDERMAL
  Filled 2018-10-28: qty 1

## 2018-10-28 MED ORDER — BISACODYL 10 MG RE SUPP
10.0000 mg | Freq: Once | RECTAL | Status: DC
  Filled 2018-10-28: qty 1

## 2018-10-28 MED ORDER — PROPOFOL 500 MG/50ML IV EMUL
INTRAVENOUS | Status: DC | PRN
  Administered 2018-10-28: 75 ug/kg/min via INTRAVENOUS

## 2018-10-28 SURGICAL SUPPLY — 14 items

## 2018-10-28 NOTE — H&P (Signed)
Patient in endo for EGD to evaluate for ugi bleed.  PE: no distress, heart RRR, Lungs clear, Abdomen soft  IMP: gi bleed Plan EGD

## 2018-10-28 NOTE — Progress Notes (Addendum)
PROGRESS NOTE    Patricia Murillo  ZOX:[id number] DOB: 12-01-1945 DOA: 10/05/2018 PCP: Patient, No Pcp Per   Brief Narrative:  73 year old with past medical history relevant for hypertension, pain, non-Hodgkin's lymphoma status post chemotherapy 10/24/2018 with R-CHOP, status post permanent pacemaker, sacral decubitus ulcer admitted from assisted living facility on 10/06/2018 with reports of hematemesis status post EGD showing cratered gastric ulcers.   Assessment & Plan:   Active Problems:   GI bleed   #) Acute on chronic blood loss anemia: Likely her presentation with a hemoglobin of 5 was compounded by the fact that she had recently received chemotherapy and was likely having profound nadir of her hemoglobin.  Her significantly increased white blood cell count is likely a consequence of recent injection with Neulasta.  Her platelets fortunately are reassuring. -EGD on 10/21/2018 shows only cratered gastric ulcers -Continue PPI per GI recommendation -Biopsies pending from EGD - We will recheck CBC tomorrow with low threshold to transfuse -Continue iron supplementation  #) Decubitus ulcer: This apparently has been slow to heal which is not tremendously surprising considering the profound amount of immunosuppression she is on. -Wound care consult  #) Non-Hodgkin's lymphoma status post chemotherapy on 10/24/2018: Patient's tremendous white blood cell count is likely secondary to Neulasta. -Daily CBC with differential -Continue acyclovir prophylaxis -Continue ciprofloxacin prophylaxis -Continue Marinol 300 mg daily  #) Hypertension: -Continue amlodipine 5 mg daily  #) Pain/psych: -Continue fentanyl patch 12 mcg every 3 days  #) Complete heart block status post permanent pacemaker: Last pacemaker interrogation showed no issues  Fluids: Gentle IV fluids Electrolytes: Monitor and supplement Nutrition: Clear liquid diet  Prophylaxis: SCDs  Disposition: Pending stability of  hemoglobin and evaluation by PT  Full code    Consultants:   GI, [facility]  Procedures:  EGD 10/20/2018:The examined esophagus was normal. Findings: Five cratered gastric ulcers were found in the gastric fundus and in the gastric body. The largest lesion was 15 mm in largest dimension. Biopsies were taken with a cold forceps for histology. No active bleeding or visible vessels. The examined duodenum was normal. - Normal esophagus. - Gastric ulcers. Biopsied. - Normal examined duodenum. Impression: . Moderate Sedation: - Clear liquid diet.  - Continue present medications. PPI  Antimicrobials:   Ciprofloxacin started 10/06/2018   Subjective: Patient reports she is feeling quite tired.  She reports some rectal pain.  She denies any further hematemesis, coffee-ground emesis, melena.  Objective: Vitals:   10/12/2018 1352 10/23/2018 1400 10/11/2018 1432 10/05/2018 1500  BP: (!) 175/66 (!) 180/65  (!) 168/58  Pulse: (!) 105 (!) 103 87 86  Resp: (!) 22 (!) 21 (!) 25 20  Temp:      TempSrc:      SpO2: 100% 100% 99% 99%  Weight:      Height:        Intake/Output Summary (Last 24 hours) at 10/09/2018 1602 Last data filed at 10/18/2018 1307 Gross per 24 hour  Intake 2940.21 ml  Output 700 ml  Net 2240.21 ml   Filed Weights   10/18/2018 0400  Weight: 67.1 kg    Examination:  General exam: Appears calm and comfortable  Respiratory system: Distant lung sounds, diminished air movements in bilateral bases, no wheezes or rhonchi, no increased work of breathing Cardiovascular system: Regular rate and rhythm, no murmurs Gastrointestinal system: Soft, nondistended, no rebound or guarding, plus bowel sounds. Central nervous system: Alert and oriented.  Is intact, moving all extremities Extremities: 1+ lower extremity edema. Skin: Port  site is clean dry and intact, large contusions over her arms Psychiatry: Judgement and insight appear normal. Mood & affect appropriate.     Data  Reviewed: I have personally reviewed following labs and imaging studies  CBC: Recent Labs  Lab 10/24/18 0845 10/19/2018 1356 10/13/2018 0855  WBC 19.8* 74.9* 26.3*  NEUTROABS 16.4*  --  23.1*  HGB 9.1* 5.0* 7.9*  HCT 29.5* 16.4* 24.8*  MCV 90.5 96.5 92.2  PLT 530* 288 182   Basic Metabolic Panel: Recent Labs  Lab 10/24/18 0845 10/19/2018 1356 10/13/2018 0855  NA 133* 133* 138  K 4.0 4.6 3.8  CL 100 107 114*  CO2 22 16* 20*  GLUCOSE 109* 230* 111*  BUN 10 29* 20  CREATININE 0.53 0.45 0.36*  CALCIUM 8.2* 7.7* 7.4*  MG  --   --  2.3  PHOS  --   --  2.2*   GFR: Estimated Creatinine Clearance: 62.7 mL/min (A) (by C-G formula based on SCr of 0.36 mg/dL (L)). Liver Function Tests: Recent Labs  Lab 10/24/18 0845 10/14/2018 1356  AST 18 17  ALT 10 10  ALKPHOS 191* 122  BILITOT 0.9 0.8  PROT 5.6* 4.3*  ALBUMIN 2.2* 1.9*   No results for input(s): LIPASE, AMYLASE in the last 168 hours. No results for input(s): AMMONIA in the last 168 hours. Coagulation Profile: No results for input(s): INR, PROTIME in the last 168 hours. Cardiac Enzymes: No results for input(s): CKTOTAL, CKMB, CKMBINDEX, TROPONINI in the last 168 hours. BNP (last 3 results) No results for input(s): PROBNP in the last 8760 hours. HbA1C: No results for input(s): HGBA1C in the last 72 hours. CBG: No results for input(s): GLUCAP in the last 168 hours. Lipid Profile: No results for input(s): CHOL, HDL, LDLCALC, TRIG, CHOLHDL, LDLDIRECT in the last 72 hours. Thyroid Function Tests: No results for input(s): TSH, T4TOTAL, FREET4, T3FREE, THYROIDAB in the last 72 hours. Anemia Panel: No results for input(s): VITAMINB12, FOLATE, FERRITIN, TIBC, IRON, RETICCTPCT in the last 72 hours. Sepsis Labs: Recent Labs  Lab 10/06/2018 1355  LATICACIDVEN 3.5*    Recent Results (from the past 240 hour(s))  MRSA PCR Screening     Status: None   Collection Time: 10/12/2018  6:19 PM  Result Value Ref Range Status   MRSA by PCR  NEGATIVE NEGATIVE Final    Comment:        The GeneXpert MRSA Assay (FDA approved for NASAL specimens only), is one component of a comprehensive MRSA colonization surveillance program. It is not intended to diagnose MRSA infection nor to guide or monitor treatment for MRSA infections. Performed at [facility], [place] [address], [place], [place] [address]          Radiology Studies: Dg Chest Portable 1 View  Result Date: 10/23/2018 CLINICAL DATA:  Vomiting blood today abdominal pain. History of lymphoma. Chemotherapy 2 days ago. EXAM: PORTABLE CHEST 1 VIEW COMPARISON:  09/21/2018 and 09/17/2018, chest CT 103/15/2019 FINDINGS: Patient is rotated to the left. Lungs are adequately inflated with persistent hazy opacification over the left base/retrocardiac region likely left effusion with associated basilar atelectasis. Infection in the left base is possible. Right lung is clear. Stable moderate cardiomegaly. Large hiatal hernia unchanged. Remainder of the exam is unchanged. IMPRESSION: Opacification over the left base/retrocardiac region without significant change likely small to moderate effusion with associated atelectasis. Infection in the left base is possible. Stable cardiomegaly. Large hiatal hernia. Electronically Signed   By: Marin Olp M.D.  On: 10/29/2018 14:38        Scheduled Meds: . acyclovir  400 mg Oral BID  . allopurinol  300 mg Oral Daily  . amLODipine  5 mg Oral Daily  . bisacodyl  10 mg Rectal Once  . chlorhexidine  15 mL Mouth Rinse BID  . cholecalciferol  2,000 Units Oral Daily  . ciprofloxacin  500 mg Oral BID  . collagenase   Topical Daily  . docusate sodium  100 mg Oral Daily  . fentaNYL  1 patch Transdermal Q72H  . ferrous gluconate  324 mg Oral BID WC  . mouth rinse  15 mL Mouth Rinse q12n4p   Continuous Infusions: . sodium chloride 100 mL/hr at 10/24/2018 1127  . pantoprozole (PROTONIX) infusion 8 mg/hr (10/15/2018 1129)  . sodium  chloride       LOS: 1 day    Time spent: [place], MD [facility]  If 7PM-7AM, please contact night-coverage [email or web address] Password [email or web address] 10/21/2018, 4:02 PM

## 2018-10-28 NOTE — Anesthesia Postprocedure Evaluation (Signed)
Anesthesia Post Note  Patient: Patricia Murillo  Procedure(s) Performed: ESOPHAGOGASTRODUODENOSCOPY (EGD) WITH PROPOFOL (N/A ) BIOPSY     Patient location during evaluation: PACU Anesthesia Type: MAC Level of consciousness: awake and alert Pain management: pain level controlled Vital Signs Assessment: post-procedure vital signs reviewed and stable Respiratory status: spontaneous breathing Cardiovascular status: stable Anesthetic complications: no    Last Vitals:  Vitals:   10/20/2018 1400 10/26/2018 1432  BP: (!) 180/65   Pulse: (!) 103 87  Resp: (!) 21 (!) 25  Temp:    SpO2: 100% 99%    Last Pain:  Vitals:   11/02/2018 1432  TempSrc:   PainSc: [place]

## 2018-10-28 NOTE — Progress Notes (Signed)
Persistent complaints of pain in her rectal area  Exam revealed impaction  manually disimpacted  Will give Dulcolax suppository Colace 20 g of lactulose p.o.

## 2018-10-28 NOTE — Anesthesia Preprocedure Evaluation (Signed)
Anesthesia Evaluation  Patient identified by MRN, date of birth, ID band Patient awake    Reviewed: Allergy & Precautions, NPO status , Patient's Chart, lab work & pertinent test results  Airway Mallampati: II  TM Distance: >3 FB Neck ROM: Full    Dental  (+) Dental Advisory Given   Pulmonary neg pulmonary ROS, former smoker,    Pulmonary exam normal breath sounds clear to auscultation       Cardiovascular Normal cardiovascular exam+ dysrhythmias + pacemaker  Rhythm:Regular Rate:Normal  Echo 08/2018 - Left ventricle: The cavity size was normal. Systolic function was normal. The estimated ejection fraction was in the range of 55% to 60%. Wall motion was normal; there were no regional wall motion abnormalities. Doppler parameters are consistent with abnormal left ventricular relaxation (grade 1 diastolic dysfunction). - Aortic valve: There was no regurgitation. - Mitral valve: There was trivial regurgitation. - Left atrium: The atrium was mildly dilated. - Right atrium: The atrium was moderately dilated. - Atrial septum: There was a patent foramen ovale. - Tricuspid valve: There was mild-moderate regurgitation. - Pulmonary arteries: Systolic pressure was moderately increased.  PA peak pressure: 44 mm Hg (S).  Impressions: - Normal LV EF with grade 1 diastolic dysfunction. PFO.   Mild-moderate TR with elevated PASP.    Neuro/Psych negative neurological ROS  negative psych ROS   GI/Hepatic Neg liver ROS, hiatal hernia, GERD  ,  Endo/Other  negative endocrine ROS  Renal/GU negative Renal ROS  negative genitourinary   Musculoskeletal  (+) Arthritis ,   Abdominal   Peds negative pediatric ROS (+)  Hematology  (+) Blood dyscrasia, anemia ,   Anesthesia Other Findings   Reproductive/Obstetrics negative OB ROS                            Anesthesia Physical Anesthesia Plan  ASA:  III  Anesthesia Plan:    Post-op Pain Management:    Induction:   PONV Risk Score and Plan:   Airway Management Planned:   Additional Equipment:   Intra-op Plan:   Post-operative Plan:   Informed Consent: I have reviewed the patients History and Physical, chart, labs and discussed the procedure including the risks, benefits and alternatives for the proposed anesthesia with the patient or authorized representative who has indicated his/her understanding and acceptance.     Dental advisory given  Plan Discussed with: CRNA  Anesthesia Plan Comments:         Anesthesia Quick Evaluation

## 2018-10-28 NOTE — Progress Notes (Signed)
Patricia Murillo   DOB:12/11/1945   OF#:[id number]   DPO#:[id number]  Hematology and oncology follow-up note  Subjective: Patient is well-known to me, under my care for her diffuse large B-cell lymphoma, 73 years currently undergoing chemotherapy.  She was admitted to yesterday for upper GI bleeding, and had EGD this morning.  She was very drowsy when I saw her after the procedure, I spoke with her children at bedside.   Objective:  Vitals:   10/20/2018 1600 11/03/2018 1700  BP:    Pulse: 82 87  Resp: (!) 29 (!) 28  Temp: 97.7 F (36.5 C)   SpO2: 99% 97%    Body mass index is 23.17 kg/m.  Intake/Output Summary (Last 24 hours) at 10/15/2018 1720 Last data filed at 10/26/2018 1307 Gross per 24 hour  Intake 2940.21 ml  Output 700 ml  Net 2240.21 ml     Sclerae unicteric  Oropharynx clear  No peripheral adenopathy  Lungs clear -- no rales or rhonchi  Heart regular rate and rhythm  Abdomen benign  MSK no focal spinal tenderness, no peripheral edema  Neuro nonfocal   CBG (last 3)  No results for input(s): GLUCAP in the last 72 hours.   Labs:  Lab Results  Component Value Date   WBC 26.3 (H) 10/18/2018   HGB 7.9 (L) 10/10/2018   HCT 24.8 (L) 10/26/2018   MCV 92.2 10/24/2018   PLT 151 10/29/2018   NEUTROABS 23.1 (H) 10/14/2018   CMP Latest Ref Rng & Units 10/13/2018 10/15/2018 10/24/2018  Glucose 70 - 99 mg/dL 111(H) 230(H) 109(H)  BUN 8 - 23 mg/dL 20 29(H) 10  Creatinine 0.44 - 1.00 mg/dL 0.36(L) 0.45 0.53  Sodium 135 - 145 mmol/L 138 133(L) 133(L)  Potassium 3.5 - 5.1 mmol/L 3.8 4.6 4.0  Chloride 98 - 111 mmol/L 114(H) 107 100  CO2 22 - 32 mmol/L 20(L) 16(L) 22  Calcium 8.9 - 10.3 mg/dL 7.4(L) 7.7(L) 8.2(L)  Total Protein 6.5 - 8.1 g/dL - 4.3(L) 5.6(L)  Total Bilirubin 0.3 - 1.2 mg/dL - 0.8 0.9  Alkaline Phos 38 - 126 U/L - 122 191(H)  AST 15 - 41 U/L - 17 18  ALT 0 - 44 U/L - 10 10     Urine Studies No results for input(s): UHGB, CRYS in the last 72 hours.  Invalid  input(s): UACOL, UAPR, USPG, UPH, UTP, UGL, UKET, UBIL, UNIT, UROB, ULEU, UEPI, UWBC, [place], [place], [place], [place], [place]  Basic Metabolic Panel: Recent Labs  Lab 10/24/18 0845 10/05/2018 1356 10/08/2018 0855  NA 133* 133* 138  K 4.0 4.6 3.8  CL 100 107 114*  CO2 22 16* 20*  GLUCOSE 109* 230* 111*  BUN 10 29* 20  CREATININE 0.53 0.45 0.36*  CALCIUM 8.2* 7.7* 7.4*  MG  --   --  2.3  PHOS  --   --  2.2*   GFR Estimated Creatinine Clearance: 62.7 mL/min (A) (by C-G formula based on SCr of 0.36 mg/dL (L)). Liver Function Tests: Recent Labs  Lab 10/24/18 0845 10/31/2018 1356  AST 18 17  ALT 10 10  ALKPHOS 191* 122  BILITOT 0.9 0.8  PROT 5.6* 4.3*  ALBUMIN 2.2* 1.9*   No results for input(s): LIPASE, AMYLASE in the last 168 hours. No results for input(s): AMMONIA in the last 168 hours. Coagulation profile No results for input(s): INR, PROTIME in the last 168 hours.  CBC: Recent Labs  Lab 10/24/18 0845 10/09/2018 1356 10/27/2018 0855  WBC 19.8* 74.9* 26.3*  NEUTROABS  16.4*  --  23.1*  HGB 9.1* 5.0* 7.9*  HCT 29.5* 16.4* 24.8*  MCV 90.5 96.5 92.2  PLT 530* 288 151   Cardiac Enzymes: No results for input(s): CKTOTAL, CKMB, CKMBINDEX, TROPONINI in the last 168 hours. BNP: Invalid input(s): POCBNP CBG: No results for input(s): GLUCAP in the last 168 hours. D-Dimer No results for input(s): DDIMER in the last 72 hours. Hgb A1c No results for input(s): HGBA1C in the last 72 hours. Lipid Profile No results for input(s): CHOL, HDL, LDLCALC, TRIG, CHOLHDL, LDLDIRECT in the last 72 hours. Thyroid function studies No results for input(s): TSH, T4TOTAL, T3FREE, THYROIDAB in the last 72 hours.  Invalid input(s): FREET3 Anemia work up No results for input(s): VITAMINB12, FOLATE, FERRITIN, TIBC, IRON, RETICCTPCT in the last 72 hours. Microbiology Recent Results (from the past 240 hour(s))  MRSA PCR Screening     Status: None   Collection Time: 10/26/2018  6:19 PM  Result Value Ref  Range Status   MRSA by PCR NEGATIVE NEGATIVE Final    Comment:        The GeneXpert MRSA Assay (FDA approved for NASAL specimens only), is one component of a comprehensive MRSA colonization surveillance program. It is not intended to diagnose MRSA infection nor to guide or monitor treatment for MRSA infections. Performed at [facility], [place] [address] [place], [place] [address]       Studies:  Dg Chest Portable 1 View  Result Date: 10/06/2018 CLINICAL DATA:  Vomiting blood today abdominal pain. History of lymphoma. Chemotherapy 2 days ago. EXAM: PORTABLE CHEST 1 VIEW COMPARISON:  09/21/2018 and 09/17/2018, chest CT 111/18/2019 FINDINGS: Patient is rotated to the left. Lungs are adequately inflated with persistent hazy opacification over the left base/retrocardiac region likely left effusion with associated basilar atelectasis. Infection in the left base is possible. Right lung is clear. Stable moderate cardiomegaly. Large hiatal hernia unchanged. Remainder of the exam is unchanged. IMPRESSION: Opacification over the left base/retrocardiac region without significant change likely small to moderate effusion with associated atelectasis. Infection in the left base is possible. Stable cardiomegaly. Large hiatal hernia. Electronically Signed   By: Marin Olp M.D.   On: 10/10/2018 14:38    Assessment: 73 y.o. with recently diagnosed non-Hodgkin diffuse large B-cell lymphoma, status post chemotherapy on 10/24/2018, sacral decubitus, complete heart block status post pacemaker, admitted for upper GI bleeding  1.  Upper GI bleeding secondary to multiple gastric ulcers 2.  Severe anemia secondary to GI bleeding, chemotherapy 3.  Diffuse large B-cell lymphoma, stage IV, status post 3 cycles of chemotherapy R-CHOP, last cycle on October 24, 2018 4.  Hypertension 5.  Sacral decubitus 6.  Complete heart block status post pacemaker 7.  Leukocytosis secondary to G-CSF    Plan:   -Appreciate GI and primary team care, she is on PPI IV -Please monitor CBC closely, she has received blood transfusion, keep her hemoglobin above 7.5 -please check iron study to see if she need iv iron  -continue oral iron  -Her leukocytosis was related to her G-CSF, no clinical concern for infection at this point.  She will develop neutropenia mid of next week -I will f/u as needed when she is here, and f/u within a week after discharge  -discussed with her children     Truitt Merle, MD 10/28/2018  5:20 PM

## 2018-10-28 NOTE — Anesthesia Preprocedure Evaluation (Deleted)
Anesthesia Evaluation    Airway        Dental   Pulmonary former smoker,          Cardiovascular     Neuro/Psych    GI/Hepatic   Endo/Other    Renal/GU      Musculoskeletal   Abdominal   Peds  Hematology   Anesthesia Other Findings   Reproductive/Obstetrics                             Anesthesia Physical Anesthesia Plan Anesthesia Quick Evaluation  

## 2018-10-28 NOTE — Op Note (Signed)
[facility] Patient Name: Patricia Murillo Procedure Date: 11/01/2018 MRN: [id number] Attending MD: Wonda Horner , MD Date of Birth: April 30, 1946 CSN: [id number] Age: 73 Admit Type: Inpatient Procedure:                Upper GI endoscopy Indications:              Hematemesis Providers:                Wonda Horner, MD, Cleda Daub, RN, William Dalton, Technician Referring MD:              Medicines:                Propofol per Anesthesia Complications:            No immediate complications. Estimated Blood Loss:     Estimated blood loss: none. Procedure:                Pre-Anesthesia Assessment:                           - Prior to the procedure, a History and Physical                            was performed, and patient medications and                            allergies were reviewed. The patient's tolerance of                            previous anesthesia was also reviewed. The risks                            and benefits of the procedure and the sedation                            options and risks were discussed with the patient.                            All questions were answered, and informed consent                            was obtained. Prior Anticoagulants: The patient has                            taken no previous anticoagulant or antiplatelet                            agents. ASA Grade Assessment: III - A patient with                            severe systemic disease. After reviewing the risks  and benefits, the patient was deemed in                            satisfactory condition to undergo the procedure.                           After obtaining informed consent, the endoscope was                            passed under direct vision. Throughout the                            procedure, the patient's blood pressure, pulse, and                            oxygen saturations were  monitored continuously. The                            GIF-H190 ([id number]) Olympus gastroscope was                            introduced through the mouth, and advanced to the                            second part of duodenum. The upper GI endoscopy was                            accomplished without difficulty. The patient                            tolerated the procedure well. Findings:      The examined esophagus was normal.      Five cratered gastric ulcers were found in the gastric fundus and in the       gastric body. The largest lesion was 15 mm in largest dimension.       Biopsies were taken with a cold forceps for histology. No active       bleeding or visible vessels.      The examined duodenum was normal. Impression:               - Normal esophagus.                           - Gastric ulcers. Biopsied.                           - Normal examined duodenum. Moderate Sedation:      . Recommendation:           - Clear liquid diet.                           - Continue present medications. PPI. Procedure Code(s):        --- Professional ---                           [phone], Esophagogastroduodenoscopy, flexible,  transoral; with biopsy, single or multiple Diagnosis Code(s):        --- Professional ---                           K25.9, Gastric ulcer, unspecified as acute or                            chronic, without hemorrhage or perforation                           K92.0, Hematemesis CPT copyright 2018 American Medical Association. All rights reserved. The codes documented in this report are preliminary and upon coder review may  be revised to meet current compliance requirements. Wonda Horner, MD 10/20/2018 10:47:05 AM This report has been signed electronically. Number of Addenda: 0

## 2018-10-28 NOTE — Transfer of Care (Addendum)
Immediate Anesthesia Transfer of Care Note  Patient: Patricia Murillo  Procedure(s) Performed: ESOPHAGOGASTRODUODENOSCOPY (EGD) WITH PROPOFOL (N/A ) BIOPSY  Patient Location: Endoscopy Unit  Anesthesia Type:MAC  Level of Consciousness: awake, alert , oriented and patient cooperative  Airway & Oxygen Therapy: Patient Spontanous Breathing and Patient connected to nasal cannula oxygen  Post-op Assessment: Report given to RN, Post -op Vital signs reviewed and stable and Patient moving all extremities  Post vital signs: Reviewed and stable  Last Vitals:  Vitals Value Taken Time  BP 168/65 10/11/2018 10:52 AM  Temp    Pulse 107 10/26/2018 10:59 AM  Resp 30 10/07/2018 10:59 AM  SpO2 98 % 10/08/2018 10:59 AM  Vitals shown include unvalidated device data.  Last Pain:  Vitals:   10/11/2018 1052  TempSrc:   PainSc: 0-No pain      Patients Stated Pain Goal: 2 (41/42/39 5320)  Complications: No apparent anesthesia complications

## 2018-10-28 NOTE — Progress Notes (Addendum)
NAME:  Patricia Murillo, MRN:  [id number], DOB:  May 15, 1946, LOS: 1 ADMISSION DATE:  10/22/2018, CONSULTATION DATE:  REFERRING MD:  Dr Wilson Singer, CHIEF COMPLAINT: Hematemesis  Brief History   Patient was brought into the hospital today for hematemesis this afternoon Described about 100 cc of bright red blood, brought in from a nursing home She did bring up a little amount about an hour ago, complaining of nausea  History of present illness   Patient with a history of lymphoma, status post chemotherapy about 2 days ago Extensive disease from recent CT chest from 09/07/2089 She was recently hospitalized for severe anemia, left arm swelling Started R-CHOP and discharged to skilled nursing facility on 10/05/2018 She was doing well up until today when she had the episode of hematemesis She was not feeling acutely sick, did have associated nausea   Past Medical History   Past Medical History:  Diagnosis Date  . Arthritis   . Hiatal hernia   . History of chicken pox   . Hyperlipidemia   . Non Hodgkin's lymphoma ([place])   . Pacemaker    Significant Hospital Events   Has remained edema dynamically stable  Consults:  GI-for endoscopy today  Procedures:   Significant Diagnostic Tests:  Last CT chest was 09/07/2018 showing progression of lymphoma, multiple masses and extensive adenopathy CT abdomen 08/31/2018 also did reveal extensive adenopathy  Micro Data:  None  Antimicrobials:  None  Interim history/subjective:  Extensive B-cell lymphoma on chemotherapy Complaint of pain and discomfort around her hips  Objective   Blood pressure (!) 180/63, pulse (!) 112, temperature 97.6 F (36.4 C), temperature source Oral, resp. rate (!) 22, height 5\' 7"  (1.702 m), weight 67.1 kg, SpO2 93 %.        Intake/Output Summary (Last 24 hours) at 10/09/2018 4536 Last data filed at 10/27/2018 0600 Gross per 24 hour  Intake 3026.43 ml  Output 700 ml  Net 2326.43 ml   Filed Weights   11/03/2018  0400  Weight: 67.1 kg    Examination: General: Elderly lady, pale, does not appear to be in distress HENT: Moist oral mucosa Lungs: Fair air entry, clear Cardiovascular: S1-S2 appreciated Abdomen: Bowel sounds appreciated Extremities: No clubbing, no edema Neuro: Awake and alert oriented x3 GU: Good output  Resolved Hospital Problem list     Assessment & Plan:  GI bleed -She is post transfusion -Not actively expectorating blood  -EGD today -We will continue to monitor hemodynamics -Continue octreotide -Continue Protonix -Follow-up with GI  Hypotension -Resolved with transfusion and fluid resuscitation  B-cell lymphoma on chemotherapy -Will involve hematology/oncology  Anemia -Likely multifactorial secondary to bleeding and recent chemo  Sacral pressure ulcers - History of third-degree heart block -Pacemaker in place  Leukocytosis -Related to Udenyca injection   Best practice:  Diet: N.p.o. Pain/Anxiety/Delirium protocol (if indicated):  VAP protocol (if indicated):  DVT prophylaxis: SCD GI prophylaxis: On Protonix Glucose control:  Mobility: Bedrest Code Status: Full code Family Communication: Discussed with family at bedside 10/20/2018 Disposition: She is for EGD today, has remained hemodynamically stable We will transfer to [facility] if she continues to be stable  Labs   CBC: Recent Labs  Lab 10/24/18 0845 10/26/2018 1356 10/24/2018 0855  WBC 19.8* 74.9* 26.3*  NEUTROABS 16.4*  --  PENDING  HGB 9.1* 5.0* 7.9*  HCT 29.5* 16.4* 24.8*  MCV 90.5 96.5 92.2  PLT 530* 288 468    Basic Metabolic Panel: Recent Labs  Lab 10/24/18 0845 10/09/2018 1356  NA 133* 133*  K 4.0 4.6  CL 100 107  CO2 22 16*  GLUCOSE 109* 230*  BUN 10 29*  CREATININE 0.53 0.45  CALCIUM 8.2* 7.7*   GFR: Estimated Creatinine Clearance: 62.7 mL/min (by C-G formula based on SCr of 0.45 mg/dL). Recent Labs  Lab 10/24/18 0845 10/30/2018 1355 10/18/2018 1356 10/29/2018 0855   WBC 19.8*  --  74.9* 26.3*  LATICACIDVEN  --  3.5*  --   --     Liver Function Tests: Recent Labs  Lab 10/24/18 0845 10/17/2018 1356  AST 18 17  ALT 10 10  ALKPHOS 191* 122  BILITOT 0.9 0.8  PROT 5.6* 4.3*  ALBUMIN 2.2* 1.9*   No results for input(s): LIPASE, AMYLASE in the last 168 hours. No results for input(s): AMMONIA in the last 168 hours.  ABG    Component Value Date/Time   TCO2 27 04/18/2018 0838     Coagulation Profile: No results for input(s): INR, PROTIME in the last 168 hours.  Cardiac Enzymes: No results for input(s): CKTOTAL, CKMB, CKMBINDEX, TROPONINI in the last 168 hours.  HbA1C: Hgb A1c MFr Bld  Date/Time Value Ref Range Status  09/13/2012 11:24 AM 5.3 4.6 - 6.5 % Final    Comment:    Glycemic Control Guidelines for People with Diabetes:Non Diabetic:  <6%Goal of Therapy: <7%Additional Action Suggested:  >8%     CBG: No results for input(s): GLUCAP in the last 168 hours.  Review of Systems:   Review of Systems  Constitutional: Negative for malaise/fatigue.  HENT: Negative.   Eyes: Negative.   Respiratory: Negative.  Negative for hemoptysis, sputum production and shortness of breath.   Cardiovascular: Negative.   Gastrointestinal: Negative for nausea.  Genitourinary: Negative.   Musculoskeletal: Positive for back pain and joint pain.  Skin: Negative.   Neurological: Negative.   Endo/Heme/Allergies: Negative.   Psychiatric/Behavioral: Negative.    Past Medical History  She,  has a past medical history of Arthritis, Hiatal hernia, History of chicken pox, Hyperlipidemia, Non Hodgkin's lymphoma ([place]), and Pacemaker.   Surgical History    Past Surgical History:  Procedure Laterality Date  . ANKLE SURGERY    . PACEMAKER IMPLANT N/A 04/18/2018   Procedure: PACEMAKER IMPLANT;  Surgeon: Evans Lance, MD;  Location: [facility];  Service: Cardiovascular;  Laterality: N/A;     Social History   reports that she quit smoking about 17  years ago. She has never used smokeless tobacco. She reports that she does not drink alcohol or use drugs.   Family History   Her family history includes Arthritis in her father and mother; Cancer in her sister; Heart disease in her paternal uncle; Hypertension in her mother.   Allergies Allergies  Allergen Reactions  . Apixaban Other (See Comments)    Rectal bleeding within 2 days of taking medication

## 2018-10-29 ENCOUNTER — Inpatient Hospital Stay (HOSPITAL_COMMUNITY): Payer: Medicare HMO

## 2018-10-29 ENCOUNTER — Encounter (HOSPITAL_COMMUNITY): Admission: EM | Disposition: E | Payer: Self-pay | Source: Home / Self Care | Attending: Emergency Medicine

## 2018-10-29 DIAGNOSIS — J690 Pneumonitis due to inhalation of food and vomit: Secondary | ICD-10-CM

## 2018-10-29 DIAGNOSIS — C8510 Unspecified B-cell lymphoma, unspecified site: Secondary | ICD-10-CM

## 2018-10-29 DIAGNOSIS — I442 Atrioventricular block, complete: Secondary | ICD-10-CM

## 2018-10-29 DIAGNOSIS — J9601 Acute respiratory failure with hypoxia: Secondary | ICD-10-CM

## 2018-10-29 HISTORY — PX: ESOPHAGOGASTRODUODENOSCOPY: SHX5428

## 2018-10-29 LAB — BASIC METABOLIC PANEL
Anion gap: 9 (ref 5–15)
Anion gap: 4 — ABNORMAL LOW (ref 5–15)
Anion gap: 8 (ref 5–15)
BUN: 13 mg/dL (ref 8–23)
BUN: 19 mg/dL (ref 8–23)
BUN: 24 mg/dL — ABNORMAL HIGH (ref 8–23)
CHLORIDE: 119 mmol/L — AB (ref 98–111)
CO2: 17 mmol/L — ABNORMAL LOW (ref 22–32)
CO2: 14 mmol/L — ABNORMAL LOW (ref 22–32)
Calcium: 7.6 mg/dL — ABNORMAL LOW (ref 8.9–10.3)
Calcium: 6.5 mg/dL — ABNORMAL LOW (ref 8.9–10.3)
Calcium: 6.4 mg/dL — CL (ref 8.9–10.3)
Chloride: 117 mmol/L — ABNORMAL HIGH (ref 98–111)
Chloride: 119 mmol/L — ABNORMAL HIGH (ref 98–111)
Creatinine, Ser: 0.5 mg/dL (ref 0.44–1.00)
Creatinine, Ser: 1.07 mg/dL — ABNORMAL HIGH (ref 0.44–1.00)
GFR calc Af Amer: >60 mL/min (ref >60)
GFR calc Af Amer: >60 mL/min (ref >60)
GFR calc non Af Amer: >60 mL/min (ref >60)
GFR calc non Af Amer: >60 mL/min (ref >60)
GFR calc non Af Amer: 52 mL/min — ABNORMAL LOW (ref >60)
Glucose, Bld: 190 mg/dL — ABNORMAL HIGH (ref 70–99)
Glucose, Bld: 106 mg/dL — ABNORMAL HIGH (ref 70–99)
Glucose, Bld: 136 mg/dL — ABNORMAL HIGH (ref 70–99)
Potassium: 3.7 mmol/L (ref 3.5–5.1)
Potassium: 4.7 mmol/L (ref 3.5–5.1)
Potassium: 3.4 mmol/L — ABNORMAL LOW (ref 3.5–5.1)
Sodium: 143 mmol/L (ref 135–145)
Sodium: 141 mmol/L (ref 135–145)
Sodium: 141 mmol/L (ref 135–145)

## 2018-10-29 LAB — CBC WITH DIFFERENTIAL/PLATELET
Abs Immature Granulocytes: 3.55 10*3/uL — ABNORMAL HIGH (ref 0.00–0.07)
Abs Immature Granulocytes: 1.1 10*3/uL — ABNORMAL HIGH (ref 0.00–0.07)
Basophils Absolute: 0.2 10*3/uL — ABNORMAL HIGH (ref 0.0–0.1)
Basophils Absolute: 0 10*3/uL (ref 0.0–0.1)
Basophils Relative: 1 %
Basophils Relative: 0 %
EOS PCT: 0 %
Eosinophils Absolute: 0.1 10*3/uL (ref 0.0–0.5)
Eosinophils Absolute: 0 10*3/uL (ref 0.0–0.5)
Eosinophils Relative: 0 %
HCT: 26.7 % — ABNORMAL LOW (ref 36.0–46.0)
HCT: 20.3 % — ABNORMAL LOW (ref 36.0–46.0)
HEMOGLOBIN: 8 g/dL — AB (ref 12.0–15.0)
HEMOGLOBIN: 6.1 g/dL — AB (ref 12.0–15.0)
Immature Granulocytes: 19 %
Immature Granulocytes: 47 %
LYMPHS ABS: 0.1 10*3/uL — AB (ref 0.7–4.0)
Lymphocytes Relative: 3 %
Lymphocytes Relative: 3 %
Lymphs Abs: 0.5 10*3/uL — ABNORMAL LOW (ref 0.7–4.0)
MCH: 29.4 pg (ref 26.0–34.0)
MCH: 29 pg (ref 26.0–34.0)
MCHC: 30 g/dL (ref 30.0–36.0)
MCHC: 30 g/dL (ref 30.0–36.0)
MCV: 98.2 fL (ref 80.0–100.0)
MCV: 96.7 fL (ref 80.0–100.0)
Monocytes Absolute: 0.1 10*3/uL (ref 0.1–1.0)
Monocytes Absolute: 0 10*3/uL — ABNORMAL LOW (ref 0.1–1.0)
Monocytes Relative: 0 %
Monocytes Relative: 2 %
Neutro Abs: 14.2 10*3/uL — ABNORMAL HIGH (ref 1.7–7.7)
Neutro Abs: 1.1 10*3/uL — ABNORMAL LOW (ref 1.7–7.7)
Neutrophils Relative %: 77 %
Neutrophils Relative %: 48 %
Platelets: 205 10*3/uL (ref 150–400)
Platelets: 35 10*3/uL — ABNORMAL LOW (ref 150–400)
RBC: 2.72 MIL/uL — ABNORMAL LOW (ref 3.87–5.11)
RBC: 2.1 MIL/uL — ABNORMAL LOW (ref 3.87–5.11)
RDW: 17.9 % — ABNORMAL HIGH (ref 11.5–15.5)
RDW: 17.8 % — ABNORMAL HIGH (ref 11.5–15.5)
WBC: 18.5 10*3/uL — ABNORMAL HIGH (ref 4.0–10.5)
WBC: 2.3 10*3/uL — ABNORMAL LOW (ref 4.0–10.5)
nRBC: 0 % (ref 0.0–0.2)
nRBC: 0 % (ref 0.0–0.2)

## 2018-10-29 LAB — BASIC METABOLIC PANEL WITH GFR
CO2: 18 mmol/L — ABNORMAL LOW (ref 22–32)
Creatinine, Ser: 0.71 mg/dL (ref 0.44–1.00)
GFR calc Af Amer: >60 mL/min (ref >60)

## 2018-10-29 LAB — BLOOD GAS, ARTERIAL
Acid-base deficit: 6.4 mmol/L — ABNORMAL HIGH (ref 0.0–2.0)
Acid-base deficit: 10.6 mmol/L — ABNORMAL HIGH (ref 0.0–2.0)
Acid-base deficit: 10.6 mmol/L — ABNORMAL HIGH (ref 0.0–2.0)
Bicarbonate: 18.2 mmol/L — ABNORMAL LOW (ref 20.0–28.0)
Bicarbonate: 14.8 mmol/L — ABNORMAL LOW (ref 20.0–28.0)
Bicarbonate: 14.4 mmol/L — ABNORMAL LOW (ref 20.0–28.0)
Drawn by: 235321
Drawn by: 331471
Drawn by: 331471
FIO2: 100
FIO2: 80
FIO2: 60
LHR: 16 {breaths}/min
LHR: 16 {breaths}/min
MECHVT: 490 mL
MECHVT: 490 mL
MECHVT: 490 mL
O2 SAT: 98.9 %
O2 Saturation: 99.8 %
O2 Saturation: 97.9 %
PATIENT TEMPERATURE: 98.6
PATIENT TEMPERATURE: 98.6
PCO2 ART: 29.8 mmHg — AB (ref 32.0–48.0)
PEEP: 5 cmH2O
PEEP: 5 cmH2O
PEEP: 5 cmH2O
PO2 ART: 110 mmHg — AB (ref 83.0–108.0)
Patient temperature: 98.6
pCO2 arterial: 35.1 mmHg (ref 32.0–48.0)
pCO2 arterial: 32.7 mmHg (ref 32.0–48.0)
pH, Arterial: 7.336 — ABNORMAL LOW (ref 7.350–7.450)
pH, Arterial: 7.279 — ABNORMAL LOW (ref 7.350–7.450)
pH, Arterial: 7.306 — ABNORMAL LOW (ref 7.350–7.450)
pO2, Arterial: 425 mmHg — ABNORMAL HIGH (ref 83.0–108.0)
pO2, Arterial: 153 mmHg — ABNORMAL HIGH (ref 83.0–108.0)

## 2018-10-29 LAB — HEMOGLOBIN AND HEMATOCRIT, BLOOD
HCT: 23.6 % — ABNORMAL LOW (ref 36.0–46.0)
HEMOGLOBIN: 7.2 g/dL — AB (ref 12.0–15.0)

## 2018-10-29 LAB — TRIGLYCERIDES: Triglycerides: 138 mg/dL (ref <150)

## 2018-10-29 LAB — CBC
HCT: 27.6 % — ABNORMAL LOW (ref 36.0–46.0)
Hemoglobin: 8.5 g/dL — ABNORMAL LOW (ref 12.0–15.0)
MCH: 29.4 pg (ref 26.0–34.0)
MCHC: 30.8 g/dL (ref 30.0–36.0)
MCV: 95.5 fL (ref 80.0–100.0)
Platelets: 78 10*3/uL — ABNORMAL LOW (ref 150–400)
RBC: 2.89 MIL/uL — ABNORMAL LOW (ref 3.87–5.11)
RDW: 17.8 % — ABNORMAL HIGH (ref 11.5–15.5)
WBC: 6.4 10*3/uL (ref 4.0–10.5)
nRBC: 0 % (ref 0.0–0.2)

## 2018-10-29 LAB — PREPARE RBC (CROSSMATCH)

## 2018-10-29 LAB — LACTIC ACID, PLASMA: Lactic Acid, Venous: 1.5 mmol/L (ref 0.5–1.9)

## 2018-10-29 SURGERY — EGD (ESOPHAGOGASTRODUODENOSCOPY)
Anesthesia: Moderate Sedation

## 2018-10-29 MED ORDER — PROPOFOL 1000 MG/100ML IV EMUL
INTRAVENOUS | Status: AC
  Administered 2018-10-29: 5 ug/kg/min via INTRAVENOUS
  Filled 2018-10-29: qty 100

## 2018-10-29 MED ORDER — NOREPINEPHRINE 16 MG/250ML-% IV SOLN
0.0000 ug/min | INTRAVENOUS | Status: DC
  Administered 2018-10-29: 12 ug/min via INTRAVENOUS
  Filled 2018-10-29 (×2): qty 250

## 2018-10-29 MED ORDER — FENTANYL CITRATE (PF) 100 MCG/2ML IJ SOLN
INTRAMUSCULAR | Status: AC
  Filled 2018-10-29: qty 2

## 2018-10-29 MED ORDER — PROMETHAZINE HCL 25 MG/ML IJ SOLN
12.5000 mg | Freq: Four times a day (QID) | INTRAMUSCULAR | Status: DC | PRN
  Administered 2018-10-31: 12.5 mg via INTRAVENOUS
  Filled 2018-10-29: qty 1

## 2018-10-29 MED ORDER — ONDANSETRON HCL 4 MG/2ML IJ SOLN: 4.0000 mg | Freq: Four times a day (QID) | INTRAMUSCULAR | Status: DC | PRN

## 2018-10-29 MED ORDER — VASOPRESSIN 20 UNIT/ML IV SOLN
0.0300 [IU]/min | INTRAVENOUS | Status: DC
  Administered 2018-10-29: 0.03 [IU]/min via INTRAVENOUS
  Filled 2018-10-29: qty 2

## 2018-10-29 MED ORDER — ORAL CARE MOUTH RINSE
15.0000 mL | OROMUCOSAL | Status: DC
  Administered 2018-10-29 – 2018-10-30 (×10): 15 mL via OROMUCOSAL

## 2018-10-29 MED ORDER — NOREPINEPHRINE BITARTRATE 1 MG/ML IV SOLN: 0.0000 ug/min | INTRAVENOUS | Status: DC

## 2018-10-29 MED ORDER — SODIUM CHLORIDE 0.9% FLUSH: 10.0000 mL | INTRAVENOUS | Status: DC | PRN

## 2018-10-29 MED ORDER — TRIMETHOBENZAMIDE HCL 100 MG/ML IM SOLN
200.0000 mg | Freq: Four times a day (QID) | INTRAMUSCULAR | Status: DC | PRN
  Filled 2018-10-29: qty 2

## 2018-10-29 MED ORDER — FENTANYL CITRATE (PF) 100 MCG/2ML IJ SOLN
50.0000 ug | INTRAMUSCULAR | Status: DC | PRN
  Administered 2018-10-29: 50 ug via INTRAVENOUS

## 2018-10-29 MED ORDER — SODIUM BICARBONATE 8.4 % IV SOLN
INTRAVENOUS | Status: DC
  Administered 2018-10-29 – 2018-10-30 (×2): via INTRAVENOUS
  Filled 2018-10-29 (×2): qty 100

## 2018-10-29 MED ORDER — DEXMEDETOMIDINE HCL IN NACL 200 MCG/50ML IV SOLN: 0.0000 ug/kg/h | INTRAVENOUS | Status: DC

## 2018-10-29 MED ORDER — FENTANYL CITRATE (PF) 100 MCG/2ML IJ SOLN
50.0000 ug | INTRAMUSCULAR | Status: DC | PRN
  Filled 2018-10-29 (×2): qty 2

## 2018-10-29 MED ORDER — PHENYLEPHRINE HCL-NACL 10-0.9 MG/250ML-% IV SOLN
INTRAVENOUS | Status: AC
  Filled 2018-10-29: qty 250

## 2018-10-29 MED ORDER — FENTANYL BOLUS VIA INFUSION
25.0000 ug | INTRAVENOUS | Status: DC | PRN
  Administered 2018-10-29 – 2018-10-30 (×3): 25 ug via INTRAVENOUS
  Filled 2018-10-29: qty 25

## 2018-10-29 MED ORDER — SODIUM CHLORIDE 0.9 % IV BOLUS
500.0000 mL | Freq: Once | INTRAVENOUS | Status: AC
  Administered 2018-10-29: 500 mL via INTRAVENOUS

## 2018-10-29 MED ORDER — FENTANYL CITRATE (PF) 100 MCG/2ML IJ SOLN: 50.0000 ug | INTRAMUSCULAR | Status: DC | PRN

## 2018-10-29 MED ORDER — PHENYLEPHRINE HCL-NACL 10-0.9 MG/250ML-% IV SOLN
0.0000 ug/min | INTRAVENOUS | Status: DC
  Administered 2018-10-29: 20 ug/min via INTRAVENOUS
  Filled 2018-10-29 (×5): qty 250

## 2018-10-29 MED ORDER — MIDAZOLAM HCL (PF) 5 MG/ML IJ SOLN
INTRAMUSCULAR | Status: AC
  Filled 2018-10-29: qty 2

## 2018-10-29 MED ORDER — SODIUM CHLORIDE 0.9% FLUSH
10.0000 mL | Freq: Two times a day (BID) | INTRAVENOUS | Status: DC
  Administered 2018-10-29: 10 mL
  Administered 2018-10-30: 40 mL
  Administered 2018-10-30 – 2018-10-31 (×2): 10 mL
  Administered 2018-10-31: 40 mL
  Administered 2018-11-01 – 2018-11-07 (×13): 10 mL

## 2018-10-29 MED ORDER — FENTANYL CITRATE (PF) 100 MCG/2ML IJ SOLN
INTRAMUSCULAR | Status: DC | PRN
  Administered 2018-10-29: 25 ug via INTRAVENOUS

## 2018-10-29 MED ORDER — PIPERACILLIN-TAZOBACTAM 3.375 G IVPB
3.3750 g | Freq: Three times a day (TID) | INTRAVENOUS | Status: DC
  Administered 2018-10-29 – 2018-11-04 (×18): 3.375 g via INTRAVENOUS
  Filled 2018-10-29 (×18): qty 50

## 2018-10-29 MED ORDER — NOREPINEPHRINE BITARTRATE 1 MG/ML IV SOLN
0.0000 ug/min | INTRAVENOUS | Status: DC
  Administered 2018-10-29: 10 ug/min via INTRAVENOUS
  Filled 2018-10-29: qty 4

## 2018-10-29 MED ORDER — SODIUM CHLORIDE 0.9% IV SOLUTION
Freq: Once | INTRAVENOUS | Status: AC
  Administered 2018-10-29: 16:00:00 via INTRAVENOUS

## 2018-10-29 MED ORDER — MIDAZOLAM HCL 2 MG/2ML IJ SOLN
1.0000 mg | INTRAMUSCULAR | Status: DC | PRN
  Administered 2018-10-29: 1 mg via INTRAVENOUS
  Administered 2018-10-29 – 2018-10-30 (×3): 2 mg via INTRAVENOUS
  Filled 2018-10-29 (×6): qty 2

## 2018-10-29 MED ORDER — TRIMETHOBENZAMIDE HCL 100 MG/ML IM SOLN
200.0000 mg | Freq: Four times a day (QID) | INTRAMUSCULAR | Status: DC | PRN
  Administered 2018-10-29: 200 mg via INTRAMUSCULAR
  Filled 2018-10-29 (×2): qty 2

## 2018-10-29 MED ORDER — MIDAZOLAM HCL 2 MG/2ML IJ SOLN
INTRAMUSCULAR | Status: AC
  Administered 2018-10-29: 05:00:00
  Filled 2018-10-29: qty 4

## 2018-10-29 MED ORDER — FENTANYL CITRATE (PF) 100 MCG/2ML IJ SOLN
50.0000 ug | INTRAMUSCULAR | Status: AC | PRN
Start: 2018-10-29 — End: 2018-10-29
  Administered 2018-10-29 (×2): 50 ug via INTRAVENOUS
  Administered 2018-10-29: 25 ug via INTRAVENOUS
  Filled 2018-10-29: qty 2

## 2018-10-29 MED ORDER — MIDAZOLAM HCL 2 MG/2ML IJ SOLN
INTRAMUSCULAR | Status: AC
  Filled 2018-10-29: qty 2

## 2018-10-29 MED ORDER — CHLORHEXIDINE GLUCONATE CLOTH 2 % EX PADS
6.0000 | MEDICATED_PAD | Freq: Every day | CUTANEOUS | Status: DC
  Administered 2018-10-30 – 2018-11-06 (×8): 6 via TOPICAL

## 2018-10-29 MED ORDER — SODIUM CHLORIDE 0.9% IV SOLUTION: Freq: Once | INTRAVENOUS | Status: DC

## 2018-10-29 MED ORDER — PROPOFOL 1000 MG/100ML IV EMUL
0.0000 ug/kg/min | INTRAVENOUS | Status: DC
  Administered 2018-10-29: 5 ug/kg/min via INTRAVENOUS

## 2018-10-29 MED ORDER — FENTANYL 2500MCG IN NS 250ML (10MCG/ML) PREMIX INFUSION
0.0000 ug/h | INTRAVENOUS | Status: DC
  Administered 2018-10-29 – 2018-11-03 (×2): 25 ug/h via INTRAVENOUS
  Filled 2018-10-29 (×3): qty 250

## 2018-10-29 MED ORDER — MIDAZOLAM HCL (PF) 10 MG/2ML IJ SOLN
INTRAMUSCULAR | Status: DC | PRN
  Administered 2018-10-29: 2 mg via INTRAVENOUS

## 2018-10-29 MED ORDER — ALBUMIN HUMAN 25 % IV SOLN
25.0000 g | Freq: Once | INTRAVENOUS | Status: AC
  Administered 2018-10-29: 25 g via INTRAVENOUS
  Filled 2018-10-29: qty 100

## 2018-10-29 MED ORDER — LABETALOL HCL 5 MG/ML IV SOLN
10.0000 mg | Freq: Once | INTRAVENOUS | Status: AC
  Administered 2018-10-29: 10 mg via INTRAVENOUS
  Filled 2018-10-29: qty 4

## 2018-10-29 MED ORDER — SODIUM CHLORIDE 0.9 % IV SOLN
0.0000 ug/min | INTRAVENOUS | Status: DC
  Administered 2018-10-29: 200 ug/min via INTRAVENOUS
  Filled 2018-10-29 (×2): qty 4

## 2018-10-29 NOTE — Progress Notes (Signed)
EGD performed at bedside in ICU. 27mcg of fentanyl IV and 2 mg Versed IV given by writer prior to EGD scope in time. ICU RN remained at bedside throughout procedure, BP monitored via arterial line during that time. Patient was responsive to pain throughout. Fentanyl 80mcg and Versed 3mg  wasted at bedside in ICU with Polly Cobia, RN

## 2018-10-29 NOTE — Op Note (Signed)
[facility] Patient Name: Patricia Murillo Procedure Date: 10/07/2018 MRN: [id number] Attending MD: Otis Brace , MD Date of Birth: 1946-05-13 CSN: [id number] Age: 73 Admit Type: Inpatient Procedure:                Upper GI endoscopy Indications:              Coffee-ground emesis Providers:                Otis Brace, MD, Carlyn Reichert, RN, Elspeth Cho, Technician Referring MD:              Medicines:                Fentanyl 25 micrograms IV, Midazolam 2 mg IV Complications:            No immediate complications. Estimated Blood Loss:     Estimated blood loss was minimal. Procedure:                Pre-Anesthesia Assessment:                           - Prior to the procedure, a History and Physical                            was performed, and patient medications and                            allergies were reviewed. The patient's tolerance of                            previous anesthesia was also reviewed. The risks                            and benefits of the procedure and the sedation                            options and risks were discussed with the patient.                            All questions were answered, and informed consent                            was obtained. Prior Anticoagulants: The patient has                            taken no previous anticoagulant or antiplatelet                            agents. ASA Grade Assessment: III - A patient with                            severe systemic disease. After reviewing the risks  and benefits, the patient was deemed in                            satisfactory condition to undergo the procedure.                           After obtaining informed consent, the endoscope was                            passed under direct vision. Throughout the                            procedure, the patient's blood pressure, pulse, and         oxygen saturations were monitored continuously. The                            GIF-H190 ([id number]) Olympus adult endoscope was                            introduced through the mouth, and advanced to the                            second part of duodenum. The upper GI endoscopy was                            technically difficult and complex due to poor                            endoscopic visualization. The patient tolerated the                            procedure well. Scope In: Scope Out: Findings:      No gross lesions were noted in the entire esophagus.      Hematin (altered blood/coffee-ground-like material) was found in the       gastric body.      Diffuse severely friable mucosa with contact bleeding was found in the       gastric body.      Four non-obstructing oozing cratered gastric ulcers with pigmented       material were found in the gastric body. The largest lesion was 15 mm in       largest dimension. For hemostasis, hemostatic spray was deployed.       Multiple sprays were applied. There was no bleeding at the end of the       procedure. There appears to be ischemic changes in some part of gastric       mucosa. Multiple large deeply cratered ulcers were seen in the proximal       body of the stomach with evidence of recent bleeding. Large amount of       blood clots in the stomach were suctioned using power suction.       Subsequently hemo-spray was applied.      Hematin (altered blood/coffee-ground-like material) was found in the       entire duodenum. Impression:               -  No gross lesions in esophagus.                           - Hematin (altered blood/coffee-ground-like                            material) in the gastric body.                           - Friable gastric mucosa.                           - Non-obstructing oozing gastric ulcers with                            pigmented material. Hemostatic spray applied.                           -  Blood in the entire examined duodenum.                           - No specimens collected. Moderate Sedation:      Moderate (conscious) sedation was administered by the endoscopy nurse       and supervised by the endoscopist. The following parameters were       monitored: oxygen saturation, heart rate, blood pressure, and response       to care. Recommendation:           - Return patient to hospital ward for ongoing care.                           - NPO.                           - Continue present medications.                           - Repeat upper endoscopy PRN for retreatment. Procedure Code(s):        --- Professional ---                           [phone], Esophagogastroduodenoscopy, flexible,                            transoral; with control of bleeding, any method Diagnosis Code(s):        --- Professional ---                           K92.2, Gastrointestinal hemorrhage, unspecified                           K31.89, Other diseases of stomach and duodenum                           K25.4, Chronic or unspecified gastric ulcer with  hemorrhage                           K92.0, Hematemesis CPT copyright 2018 American Medical Association. All rights reserved. The codes documented in this report are preliminary and upon coder review may  be revised to meet current compliance requirements. Otis Brace, MD Otis Brace, MD 10/08/2018 12:53:11 PM Number of Addenda: 0

## 2018-10-29 NOTE — Progress Notes (Signed)
[facility] Gastroenterology Progress Note  Patricia Murillo 73 y.o. 18-Oct-1945  CC: GI bleed   Subjective: Yesterday's events noted.  Apparently patient had nausea vomiting followed by coffee-ground emesis along with possible aspiration resulted in hypoxic respiratory failure requiring intubation.  She was also hypotensive currently on pressor support.  OG tube suction shows coffee-ground material there is no black tarry stools.    ROS : Not able to obtain.   Objective: Vital signs in last 24 hours: Vitals:   11/01/2018 0905 10/10/2018 0915  BP:  (!) 103/33  Pulse: (!) 118 91  Resp: (!) 33 (!) 29  Temp: (!) 97.3 F (36.3 C) 98 F (36.7 C)  SpO2: 98% 99%    Physical Exam:  General.  Intubated, somewhat sedated. Respiratory.  Coarse breath sounds. Abdomen.  Soft, nontender, nondistended, bowel sounds present. Lab Results: Recent Labs    10/30/2018 0855 10/12/2018 0635  NA 138 143  K 3.8 3.7  CL 114* 117*  CO2 20* 17*  GLUCOSE 111* 190*  BUN 20 13  CREATININE 0.36* 0.50  CALCIUM 7.4* 7.6*  MG 2.3  --   PHOS 2.2*  --    Recent Labs    11/02/2018 1356  AST 17  ALT 10  ALKPHOS 122  BILITOT 0.8  PROT 4.3*  ALBUMIN 1.9*   Recent Labs    10/30/2018 0855 10/19/2018 0635  WBC 26.3* 18.5*  NEUTROABS 23.1* PENDING  HGB 7.9* 8.0*  HCT 24.8* 26.7*  MCV 92.2 98.2  PLT 151 205   No results for input(s): LABPROT, INR in the last 72 hours.    Assessment/Plan: -Upper GI bleed with coffee-ground emesis.  EGD yesterday showed 5 gastric ulcers in the body and fundus largest measuring 15 mm but there was no active bleeding. -Hypoxic respiratory failure.  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  Recommendations ------------------------- -Discussed with critical care attending Dr. Cicero Duck as well as multiple family members.  Plan for repeat EGD today at bedside. -Continue Protonix drip. monitor  H&H.  Transfuse to keep  hemoglobin around 8. -Remains critically ill with multiple medical comorbidities. -GI will follow  Otis Brace MD, [place] 10/21/2018, 10:15 AM  Contact #  [phone]

## 2018-10-29 NOTE — Progress Notes (Signed)
At the beginning of EGD, patient vomited after insertion of endoscopy scope. Patient's BP decreased from 975'P systolic to 60 systolic. Increased levophed to 65mcg and neosynephrine to 328mcg. MD Olarere aware and ordered for vasopressin to be started at beside.

## 2018-10-29 NOTE — Progress Notes (Addendum)
NAME:  Patricia Murillo, MRN:  [id number], DOB:  24-Apr-1946, LOS: 2 ADMISSION DATE:  10/30/2018, CONSULTATION DATE:  REFERRING MD:  Dr Wilson Singer, CHIEF COMPLAINT: Hematemesis  Brief History   Patient was brought into the hospital today for hematemesis on 1/23. Decompensate overnight 10/09/2018; Nausea with vomiting of coffee ground emesis. Aspirated and intubated for resp failure.   Past Medical History  Arthritis, hiatal hernia, history of lymphoma with multiple masses and adenopathy; R-CHOP last received on 1/20, permanent pacemaker, sacral decubitus ulcer Significant Hospital Events   1/23: Admitted with hematemesis; EGD showed non-bleeding cratered ulcers. 1/25:-complaining of nausea vomiting, vomited a few times and aspirated, progressed to respiratory failure, required intubation.  Hemodynamic compromise following initial stabilization Significant amount of coffee grounds noted in endotracheal tube following intubation.  Repeat EGD showed large amount of blood clots in the proximal body of the stomach with concern for ischemic changes in the gastric mucosa as well as deeply cratered ulcers in the proximal body of the stomach 1/26: Extubated 1/27: Worsening work of breathing,Progressive hypoxia, chest x-ray with concern for pulmonary edema, no significant improvement with diuresis.  Reintubated.  Consults:  GI-post endoscopy significant for multiple ulcers-nonbleeding  Procedures:  10/18/2018 right IJ placed 10/11/2018 endotracheal tube placed; extubated 1/26 Oral endotracheal tube 1/26  Significant Diagnostic Tests:  Last CT chest was 09/07/2018 showing progression of lymphoma, multiple masses and extensive adenopathy CT abdomen 08/31/2018 also did reveal extensive adenopathy Chest x-ray on 10/12/2018-no new infiltrative process, large hiatal hernia, tube appropriately positioned Chest x-ray post line placement noted-no pneumothorax, fluffy infiltrate on left Micro Data:   None  Antimicrobials:  None Zosyn 11/02/2018 Interim history/subjective:   Sedated on ventilator Objective   Blood pressure (!) 98/36, pulse (!) 117, temperature (!) 96.9 F (36.1 C), temperature source Axillary, resp. rate (!) 33, height 5\' 7"  (1.702 m), weight 68.5 kg, SpO2 100 %.    Vent Mode: PRVC FiO2 (%):  [40 %-100 %] 100 % Set Rate:  [16 bmp] 16 bmp Vt Set:  [490 mL] 490 mL PEEP:  [5 cmH20] 5 cmH20 Plateau Pressure:  [20 cmH20-26 cmH20] 20 cmH20   Intake/Output Summary (Last 24 hours) at 10/12/2018 0846 Last data filed at 10/12/2018 0845 Gross per 24 hour  Intake 1228.78 ml  Output 1250 ml  Net -21.22 ml   Filed Weights   10/18/2018 0400 10/31/2018 0201  Weight: 67.1 kg 68.5 kg    Examination: General: This is a chronically ill 73 year old female currently sedated on the ventilator HEENT normocephalic atraumatic orally intubated Pulmonary: Clear to auscultation diminished bases Cardiac: Soft systolic murmur, regular rhythm Abdomen: Nontender positive bowel sounds Extremities: Diffuse anasarca weeping upper extremities scattered areas of ecchymosis GU: Clear yellow Neuro: Sedated  Resolved Hospital Problem list   Hemorrhagic shock  Assessment & Plan:  Acute hypoxemic respiratory failure secondary to pulmonary edema and worsening atelectasis, superimposed on aspiration pneumonia Pcxr: Portable chest x-ray personally reviewed: Endotracheal tubes in satisfactory position bibasilar airspace disease appears to be element of effusion and consolidation Plan Continuing full ventilator support VAP bundle Wean FiO2 and PEEP Diuresis blood pressure, BUN/creatinine tolerate Day # 3 zosyn of 7 Trend cbc and fever curve Repeat a.m. chest x-ray Follow-up echocardiogram   Acute blood loss anemia secondary to upper GI bleed, with bleeding gastric ulcers -hgb stable over night   Plan Trend cbc Cont PPI gtt  ? Add carafate (defer to GI)  B-cell lymphoma on  chemotherapy Plan We will notify oncology of admission  Severe malnutrition Plan Start tube feeds  Sacral pressure ulcers Plan Pressure relief interventions Wound ostomy nurse consult  Fluid and electrolyte imbalance: hypernatremia, hyperchloremia and hypokalemia  Plan Replace potassium Provide gentle free water replacement via D5 water infusion  Leukopenia and thrombocytopenia  -Related to Udenyca injection Plan Trend cbc   Best practice:  Diet: N.p.o.; starting tube feeds Pain/Anxiety/Delirium protocol (if indicated): 1/27 VAP protocol (if indicated): 1/27 DVT prophylaxis: SCD, GI bleed GI prophylaxis: On Protonix Glucose control: ssi Mobility: Bedrest Code Status: Full code Family Communication: Discussed with family at bedside 10/14/2018 Disposition remains critically ill due to respiratory failure requiring titration of PEEP and FiO2.  I am worried about her ability to come off the ventilator.  I think there was a component of mucous plugging and atelectasis on top of pulmonary edema contributing to her respiratory failure.  We will see what echocardiogram shows.  If this is primarily weakness and poor cough mechanics her chance for successfully coming off the ventilator is much less  My critical care x35 minutes  Erick Colace ACNP-BC [place] Pager # [phone] OR # [phone] if no answer

## 2018-10-29 NOTE — Brief Op Note (Signed)
10/07/2018 - 10/09/2018  12:53 PM  PATIENT:  Patricia Murillo  73 y.o. female  PRE-OPERATIVE DIAGNOSIS:  Upper GI bleed  POST-OPERATIVE DIAGNOSIS:  Gastric ulcers, gastric edema, sloughing mucosa, hemospray, biovac suction used  PROCEDURE:  Procedure(s): ESOPHAGOGASTRODUODENOSCOPY (EGD) (N/A) HEMOSTASIS CONTROL  SURGEON:  Surgeon(s) and Role:    * Tayron Hunnell, MD - Primary  Findings ---------- -Large amount of blood clots were seen in the stomach.  There appears to be ischemic changes in some part of gastric mucosa.  Multiple large deeply cratered ulcers were seen in the proximal body of the stomach with evidence of recent bleeding.  Large amount of blood clots in the stomach were suctioned using power suction.  Subsequently hemo-spray was applied.  Recommendations ---------------------------- -Continue Protonix drip. -N.p.o. for now -Okay to use OG tube after 12 hours -Findings discussed with patient's family  - Monitor H&H.  Transfuse as needed to keep hemoglobin around 8. -GI will follow.   Otis Brace MD, [place] 10/08/2018, 12:54 PM  Contact #  [phone]

## 2018-10-29 NOTE — Procedures (Signed)
Central Venous Catheter Insertion Procedure Note Patricia Murillo [id number] 07-20-1946  Procedure: Insertion of Central Venous Catheter Indications: Assessment of intravascular volume, Drug and/or fluid administration and Frequent blood sampling  Procedure Details Consent: Risks of procedure as well as the alternatives and risks of each were explained to the (patient/caregiver).  Consent for procedure obtained. Time Out: Verified patient identification, verified procedure, site/side was marked, verified correct patient position, special equipment/implants available, medications/allergies/relevent history reviewed, required imaging and test results available.  Performed  Maximum sterile technique was used including antiseptics, cap, gloves, gown, hand hygiene, mask and sheet. Skin prep: Chlorhexidine; local anesthetic administered A antimicrobial bonded/coated triple lumen catheter was placed in the right internal jugular vein using the Seldinger technique.  Evaluation Blood flow good Complications: No apparent complications Patient did tolerate procedure well. Chest X-ray ordered to verify placement.  CXR: normal.  Galvin Aversa C Georjean Toya 10/05/2018, 2:36 PM

## 2018-10-29 NOTE — Progress Notes (Signed)
PROGRESS NOTE    Patricia Murillo  YQI:[id number] DOB: 04/05/1946 DOA: 10/21/2018 PCP: Patient, No Pcp Per   Brief Narrative:  73 year old with past medical history relevant for hypertension, pain, non-Hodgkin's lymphoma status post chemotherapy 10/24/2018 with R-CHOP, status post permanent pacemaker, sacral decubitus ulcer admitted from assisted living facility on 10/19/2018 with reports of hematemesis status post EGD showing cratered gastric ulcers.  Overnight on 11/03/2018 patient acute hypoxemic respiratory failure due to likely aspiration complicated by hypotension requiring intubation and placement of central lines with NG tube persistently bringing up coffee-ground emesis.   Assessment & Plan:   Active Problems:   Arthritis involving multiple sites   Complete heart block (HCC)   Lymphoma (HCC)   GI bleed   Acute hypoxemic respiratory failure (HCC)  #) Acute hypoxemic respiratory failure/aspiration pneumonia: Patient was intubated 10/23/2018 overnight after multiple episodes of emesis coffee-ground and likely aspiration event. -Arterial line placed -We will check ABGs -Continue IV Zosyn for likely aspiration pneumonia pneumonitis -Continue IV fentanyl and midazolam for sedation  #) Hypotension: This was likely a consequence of multiple medications given for initially hypertension as well as propofol used for sedation.  Additionally certainly acute blood loss anemia from coffee-ground emesis as well as possible infection from aspiration pneumonia could be playing a role -2 units packed red blood cells stat -GI consulted -Continue Levophed  #) Acute on chronic blood loss anemia: Per above continues to have coffee-ground emesis coming from NG tube as well as endotracheal tube. -EGD on 10/26/2018 shows only cratered gastric ulcers, biopsies are pending -Status post 2 units packed red blood cells, repeat pending -Pending GI reevaluation today -Continue PPI per GI  recommendation -Continue iron supplementation  #) Decubitus ulcer: This apparently has been slow to heal which is not tremendously surprising considering the profound amount of immunosuppression she is on. -Wound care consult  #) Non-Hodgkin's lymphoma status post chemotherapy on 10/24/2018: Patient's tremendous white blood cell count is likely secondary to Neulasta. -Daily CBC with differential -Continue acyclovir prophylaxis -Continue ciprofloxacin prophylaxis -Continue Marinol 300 mg daily  #) Hypertension: -Discontinue/hold amlodipine 5 mg daily  #) Pain/psych: -Continue fentanyl patch 12 mcg every 3 days  #) Complete heart block status post permanent pacemaker: Last pacemaker interrogation showed no issues  Fluids: Per above resuscitation Electrolytes: Monitor and supplement Nutrition: N.p.o.  Prophylaxis: SCDs  Disposition: Pending resolution of hypoxia and stability of hemodynamics  Full code    Consultants:   GI, [place]  Procedures:  EGD 10/11/2018:The examined esophagus was normal. Findings: Five cratered gastric ulcers were found in the gastric fundus and in the gastric body. The largest lesion was 15 mm in largest dimension. Biopsies were taken with a cold forceps for histology. No active bleeding or visible vessels. The examined duodenum was normal. - Normal esophagus. - Gastric ulcers. Biopsied. - Normal examined duodenum. Impression: . Moderate Sedation: - Clear liquid diet.  - Continue present medications. PPI  Antimicrobials:   Ciprofloxacin started 10/27/2018   Subjective: Last night patient was noted to be quite nauseated.  She began to have episodes of coffee-ground emesis and then subsequently had acute hypoxemic respiratory failure likely due to aspiration that required intubation on 10/28/2018.  This morning she was quite hypotensive likely due to combination of factors and got 2 units packed packed red blood cells, arterial line, IJ, acute  evaluation by GI.  She is currently on pressors.  Objective: Vitals:   10/27/2018 0845 10/10/2018 0850 10/27/2018 0905 11/03/2018 0915  BP: (!) 98/36 Marland Kitchen)  98/36  (!) 103/33  Pulse: (!) 117 (!) 117 (!) 118 91  Resp: (!) 33 (!) 33 (!) 33 (!) 29  Temp: (!) 96.9 F (36.1 C) (!) 96.9 F (36.1 C) (!) 97.3 F (36.3 C) 98 F (36.7 C)  TempSrc: Axillary Axillary Axillary Axillary  SpO2: 100% 100% 98% 99%  Weight:      Height:        Intake/Output Summary (Last 24 hours) at 10/28/2018 1016 Last data filed at 10/08/2018 4315 Gross per 24 hour  Intake 1290.78 ml  Output 1250 ml  Net 40.78 ml   Filed Weights   10/27/2018 0400 10/16/2018 0201  Weight: 67.1 kg 68.5 kg    Examination:  General exam: Intubated but not sedated Respiratory system: Coarse sounds worse on left, tachypneic and breathing over ventilator, mechanically ventilated Cardiovascular system: Irregularly irregular, tachycardic Gastrointestinal system: Soft, nondistended, no rebound or guarding, plus bowel sounds.,  NG tube in place Central nervous system: Intubated but not adequately sedated Extremities: 1+ lower extremity edema. Skin: Port site is clean dry and intact, large contusions over her arms on the right, arterial line on right, central line placed on right Psychiatry: Unable to assess due to medical condition    Data Reviewed: I have personally reviewed following labs and imaging studies  CBC: Recent Labs  Lab 10/24/18 0845 10/13/2018 1356 10/13/2018 0855 10/17/2018 0635  WBC 19.8* 74.9* 26.3* 18.5*  NEUTROABS 16.4*  --  23.1* PENDING  HGB 9.1* 5.0* 7.9* 8.0*  HCT 29.5* 16.4* 24.8* 26.7*  MCV 90.5 96.5 92.2 98.2  PLT 530* 288 151 400   Basic Metabolic Panel: Recent Labs  Lab 10/24/18 0845 10/12/2018 1356 10/27/2018 0855 10/10/2018 0635  NA 133* 133* 138 143  K 4.0 4.6 3.8 3.7  CL 100 107 114* 117*  CO2 22 16* 20* 17*  GLUCOSE 109* 230* 111* 190*  BUN 10 29* 20 13  CREATININE 0.53 0.45 0.36* 0.50  CALCIUM  8.2* 7.7* 7.4* 7.6*  MG  --   --  2.3  --   PHOS  --   --  2.2*  --    GFR: Estimated Creatinine Clearance: 62.7 mL/min (by C-G formula based on SCr of 0.5 mg/dL). Liver Function Tests: Recent Labs  Lab 10/24/18 0845 10/30/2018 1356  AST 18 17  ALT 10 10  ALKPHOS 191* 122  BILITOT 0.9 0.8  PROT 5.6* 4.3*  ALBUMIN 2.2* 1.9*   No results for input(s): LIPASE, AMYLASE in the last 168 hours. No results for input(s): AMMONIA in the last 168 hours. Coagulation Profile: No results for input(s): INR, PROTIME in the last 168 hours. Cardiac Enzymes: No results for input(s): CKTOTAL, CKMB, CKMBINDEX, TROPONINI in the last 168 hours. BNP (last 3 results) No results for input(s): PROBNP in the last 8760 hours. HbA1C: No results for input(s): HGBA1C in the last 72 hours. CBG: No results for input(s): GLUCAP in the last 168 hours. Lipid Profile: Recent Labs    10/13/2018 0635  TRIG 138   Thyroid Function Tests: No results for input(s): TSH, T4TOTAL, FREET4, T3FREE, THYROIDAB in the last 72 hours. Anemia Panel: No results for input(s): VITAMINB12, FOLATE, FERRITIN, TIBC, IRON, RETICCTPCT in the last 72 hours. Sepsis Labs: Recent Labs  Lab 10/06/2018 1355  LATICACIDVEN 3.5*    Recent Results (from the past 240 hour(s))  MRSA PCR Screening     Status: None   Collection Time: 10/26/2018  6:19 PM  Result Value Ref Range Status   MRSA  by PCR NEGATIVE NEGATIVE Final    Comment:        The GeneXpert MRSA Assay (FDA approved for NASAL specimens only), is one component of a comprehensive MRSA colonization surveillance program. It is not intended to diagnose MRSA infection nor to guide or monitor treatment for MRSA infections. Performed at [facility], [place] [address] [place], [place] [address]          Radiology Studies: Dg Chest Port 1 View  Result Date: 10/28/2018 CLINICAL DATA:  Central line placement. Former smoker. History of non-Hodgkin's lymphoma.  EXAM: PORTABLE CHEST 1 VIEW COMPARISON:  10/31/2018. FINDINGS: Right chest wall pacer device is noted with leads in the right atrial appendage and right ventricle. Heart size appears normal. ET tube tip is above the carina. There is an enteric tube with tip in large hiatal hernia. Interval placement of right IJ catheter. The tip is in the projection of the SVC. No pneumothorax identified. Left midlung and left lower lobe airspace opacities identified, increased from previous exam. IMPRESSION: 1. No pneumothorax after right IJ catheter placement. The tip projects over the SVC. 2. Worsening aeration to the left midlung and left base. Electronically Signed   By: Kerby Moors M.D.   On: 10/27/2018 09:24   Dg Chest Port 1 View  Result Date: 10/07/2018 CLINICAL DATA:  Endotracheal tube placement. EXAM: PORTABLE CHEST 1 VIEW COMPARISON:  Chest x-rays dated 10/11/2018 and 09/21/2017. FINDINGS: Endotracheal tube appears well positioned with tip approximately 4-5 cm above the level of the carina. RIGHT chest wall pacemaker leads appear stable in position. Stable cardiomegaly. Again noted is a large hiatal hernia. Probable atelectasis and/or small pleural effusion at the LEFT lung base. Lungs otherwise clear. Lucency at the RIGHT lung apex is similar to previous study in presumed to be due to overlying skin fold. No evidence of pneumothorax seen. IMPRESSION: 1. Endotracheal tube well positioned with tip approximately 4-5 cm above the level of the carina. 2. Small pleural effusion and/or atelectasis at the LEFT lung base. Lungs otherwise clear. No evidence of pneumonia or pulmonary edema. 3. Stable cardiomegaly. 4. Large hiatal hernia. Electronically Signed   By: Franki Cabot M.D.   On: 10/21/2018 05:22   Dg Chest Portable 1 View  Result Date: 10/30/2018 CLINICAL DATA:  Vomiting blood today abdominal pain. History of lymphoma. Chemotherapy 2 days ago. EXAM: PORTABLE CHEST 1 VIEW COMPARISON:  09/21/2018 and  09/17/2018, chest CT 103/09/2018 FINDINGS: Patient is rotated to the left. Lungs are adequately inflated with persistent hazy opacification over the left base/retrocardiac region likely left effusion with associated basilar atelectasis. Infection in the left base is possible. Right lung is clear. Stable moderate cardiomegaly. Large hiatal hernia unchanged. Remainder of the exam is unchanged. IMPRESSION: Opacification over the left base/retrocardiac region without significant change likely small to moderate effusion with associated atelectasis. Infection in the left base is possible. Stable cardiomegaly. Large hiatal hernia. Electronically Signed   By: Marin Olp M.D.   On: 10/09/2018 14:38   Dg Abd Portable 1v  Result Date: 10/23/2018 CLINICAL DATA:  Orogastric tube placement EXAM: PORTABLE ABDOMEN - 1 VIEW COMPARISON:  08/31/2018 abdominal CT FINDINGS: Orogastric tube which loops through a hiatal hernia, tip and side port at the level of the diaphragm. Hazy lower lung opacities better evaluated on dedicated chest x-ray. The endotracheal tube tip is seen above the carina. Nonobstructive bowel gas pattern. IMPRESSION: Orogastric tube tip in the stomach (which is intrathoracic from large hiatal hernia). Electronically Signed  By: Monte Fantasia M.D.   On: 10/06/2018 07:54        Scheduled Meds: . sodium chloride   Intravenous Once  . acyclovir  400 mg Oral BID  . allopurinol  300 mg Oral Daily  . amLODipine  5 mg Oral Daily  . bisacodyl  10 mg Rectal Once  . chlorhexidine  15 mL Mouth Rinse BID  . cholecalciferol  2,000 Units Oral Daily  . ciprofloxacin  500 mg Oral BID  . collagenase   Topical Daily  . docusate sodium  100 mg Oral Daily  . fentaNYL  1 patch Transdermal Q72H  . ferrous gluconate  324 mg Oral BID WC  . mouth rinse  15 mL Mouth Rinse q12n4p  . midazolam       Continuous Infusions: . sodium chloride 100 mL/hr at 10/12/2018 0615  . norepinephrine (LEVOPHED) Adult infusion 10  mcg/min (10/18/2018 0921)  . pantoprozole (PROTONIX) infusion 8 mg/hr (10/07/2018 2133)  . phenylephrine (NEO-SYNEPHRINE) Adult infusion 220 mcg/min (10/23/2018 0714)  . piperacillin-tazobactam (ZOSYN)  IV    . propofol (DIPRIVAN) infusion Stopped (10/25/2018 0600)  . sodium chloride       LOS: 2 days    Time spent: [place], MD [facility]  If 7PM-7AM, please contact night-coverage [email or web address] Password [facility] 10/19/2018, 10:16 AM

## 2018-10-29 NOTE — Procedures (Signed)
Arterial Catheter Insertion Procedure Note Patricia Murillo [id number] Mar 27, 1946  Procedure: Insertion of Arterial Catheter  Indications: Blood pressure monitoring and Frequent blood sampling  Procedure Details Consent: Risks of procedure as well as the alternatives and risks of each were explained to the (patient/caregiver).  Consent for procedure obtained. Time Out: Verified patient identification, verified procedure, site/side was marked, verified correct patient position, special equipment/implants available, medications/allergies/relevent history reviewed, required imaging and test results available.  Performed  Maximum sterile technique was used including antiseptics, cap, gloves, hand hygiene, mask and sheet. Skin prep: Chlorhexidine; local anesthetic administered 22 gauge catheter was inserted into right radial artery using the Seldinger technique. ULTRASOUND GUIDANCE USED: YES Evaluation Blood flow good; BP tracing good. Complications: No apparent complications.   Patricia Murillo 10/15/2018

## 2018-10-29 NOTE — Progress Notes (Signed)
At 0330am pt c/o nausea and 10/10 pain in chest r/t acid reflux.  I returned to room ~0340am. Pt seen laying on left side vomiting burgundy colored substance w/ son holding bag  PRN phenergan and Fentanyl given @ 0345am  Vomiting continued - Pt vomited ~4 times  Suspected aspiration when O2 sat dropped to below 88 @ 0356pm and pt began to have rhythm changes on monitor  O2 sat unresponsive to HFNC and  Non-rebreather mask  Respiratory and E-Link called  Pt continues to vomit burgundy substance, oral suction provided  Bucket mask was placed until MD came for intubation  Pt successfully intubated @ 0440am

## 2018-10-29 NOTE — Procedures (Addendum)
Intubation Procedure Note ILHAM ROUGHTON [id number] 07-09-1946  I was called to assess the patient when I came in patient is on nonrebreather facemask saturating 80% and severe respiratory distress actively vomiting.  I decision was made to emergently intubate patient discussed with children at bedside.  Procedure: Intubation Indications: Respiratory insufficiency acute hypoxemic respiratory failure  Procedure Details Consent: Risks of procedure as well as the alternatives and risks of each were explained to the (patient/caregiver).  Consent for procedure obtained. Time Out: Verified patient identification, verified procedure, site/side was marked, verified correct patient position, special equipment/implants available, medications/allergies/relevent history reviewed, required imaging and test results available.  Performed  Maximum sterile technique was used including gloves and mask.  MAC and 3 patient had significant amount of coffee ground vomitus coming out of her stomach covering his vocal cords glide scope camera got blurry so a regular MAC 3 was used and endotracheal tube was introduced from first attempt there was significant amount of aspiration coming out of her endotracheal tube.   Evaluation Hemodynamic Status: BP stable throughout; O2 sats: Patient was saturating in the low 80s Patient's Current Condition: stable Complications: No apparent complications Patient did tolerate procedure well. Chest X-ray ordered to verify placement.  CXR: pending. Patient has significant aspiration we will start patient on Zosyn   Aldean Jewett 11/01/2018

## 2018-10-29 NOTE — Progress Notes (Signed)
Pharmacy Antibiotic Note  Patricia Murillo is a 73 y.o. female admitted on 10/25/2018 with pneumonia (aspiration).  Pharmacy has been consulted for zosyn dosing.  Plan: Zosyn 3.375g IV q8h (4 hour infusion).  F/u scr/culturs  Height: 5\' 7"  (170.2 cm) Weight: 151 lb 0.2 oz (68.5 kg) IBW/kg (Calculated) : 61.6  Temp (24hrs), Avg:98 F (36.7 C), Min:97.2 F (36.2 C), Max:98.9 F (37.2 C)  Recent Labs  Lab 10/24/18 0845 10/09/2018 1355 10/14/2018 1356 10/14/2018 0855  WBC 19.8*  --  74.9* 26.3*  CREATININE 0.53  --  0.45 0.36*  LATICACIDVEN  --  3.5*  --   --     Estimated Creatinine Clearance: 62.7 mL/min (A) (by C-G formula based on SCr of 0.36 mg/dL (L)).    Allergies  Allergen Reactions  . Apixaban Other (See Comments)    Rectal bleeding within 2 days of taking medication    Antimicrobials this admission: 1/25 zosyn >>    >>   Dose adjustments this admission:   Microbiology results:  BCx:   UCx:    Sputum:    MRSA PCR:   Thank you for allowing pharmacy to be a part of this patient's care.  Dorrene German 10/15/2018 5:21 AM

## 2018-10-30 ENCOUNTER — Inpatient Hospital Stay (HOSPITAL_COMMUNITY): Payer: Medicare HMO

## 2018-10-30 LAB — HEMOGLOBIN AND HEMATOCRIT, BLOOD
HCT: 21.9 % — ABNORMAL LOW (ref 36.0–46.0)
HCT: 21.9 % — ABNORMAL LOW (ref 36.0–46.0)
Hemoglobin: 7 g/dL — ABNORMAL LOW (ref 12.0–15.0)
Hemoglobin: 7 g/dL — ABNORMAL LOW (ref 12.0–15.0)

## 2018-10-30 LAB — BLOOD GAS, ARTERIAL
ACID-BASE DEFICIT: 6.9 mmol/L — AB (ref 0.0–2.0)
Bicarbonate: 17.4 mmol/L — ABNORMAL LOW (ref 20.0–28.0)
Drawn by: 232811
FIO2: 40
MECHVT: 490 mL
O2 Saturation: 98.2 %
PEEP: 5 cmH2O
Patient temperature: 98.9
RATE: 16 resp/min
pCO2 arterial: 31.7 mmHg — ABNORMAL LOW (ref 32.0–48.0)
pH, Arterial: 7.358 (ref 7.350–7.450)
pO2, Arterial: 107 mmHg (ref 83.0–108.0)

## 2018-10-30 LAB — BASIC METABOLIC PANEL
Anion gap: 7 (ref 5–15)
BUN: 25 mg/dL — ABNORMAL HIGH (ref 8–23)
CO2: 18 mmol/L — ABNORMAL LOW (ref 22–32)
CREATININE: 0.87 mg/dL (ref 0.44–1.00)
Calcium: 6.3 mg/dL — CL (ref 8.9–10.3)
Chloride: 118 mmol/L — ABNORMAL HIGH (ref 98–111)
GFR calc Af Amer: >60 mL/min (ref >60)
GFR calc non Af Amer: >60 mL/min (ref >60)
Glucose, Bld: 112 mg/dL — ABNORMAL HIGH (ref 70–99)
Potassium: 2.8 mmol/L — ABNORMAL LOW (ref 3.5–5.1)
Sodium: 143 mmol/L (ref 135–145)

## 2018-10-30 LAB — PREPARE RBC (CROSSMATCH)

## 2018-10-30 LAB — APTT: aPTT: 35 seconds (ref 24–36)

## 2018-10-30 LAB — PROTIME-INR
INR: 1.51
Prothrombin Time: 18.1 seconds — ABNORMAL HIGH (ref 11.4–15.2)

## 2018-10-30 MED ORDER — FUROSEMIDE 10 MG/ML IJ SOLN
20.0000 mg | Freq: Once | INTRAMUSCULAR | Status: AC
  Administered 2018-10-30: 20 mg via INTRAVENOUS
  Filled 2018-10-30: qty 2

## 2018-10-30 MED ORDER — POTASSIUM CHLORIDE 10 MEQ/50ML IV SOLN
10.0000 meq | INTRAVENOUS | Status: AC
  Administered 2018-10-30 (×3): 10 meq via INTRAVENOUS
  Filled 2018-10-30 (×3): qty 50

## 2018-10-30 MED ORDER — SODIUM CHLORIDE 0.9 % IV SOLN
1.0000 mg/kg/h | INTRAVENOUS | Status: DC
  Administered 2018-10-30 (×2): 1 mg/kg/h via INTRAVENOUS
  Filled 2018-10-30 (×4): qty 100

## 2018-10-30 MED ORDER — SODIUM CHLORIDE 0.9 % IV SOLN
8.0000 mg/h | INTRAVENOUS | Status: DC
  Administered 2018-10-30 – 2018-11-04 (×11): 8 mg/h via INTRAVENOUS
  Filled 2018-10-30 (×21): qty 80

## 2018-10-30 MED ORDER — SODIUM CHLORIDE 0.9% IV SOLUTION: Freq: Once | INTRAVENOUS | Status: DC

## 2018-10-30 MED ORDER — FENTANYL CITRATE (PF) 100 MCG/2ML IJ SOLN
50.0000 ug | INTRAMUSCULAR | Status: DC | PRN
  Administered 2018-10-30 – 2018-11-02 (×2): 50 ug via INTRAVENOUS
  Filled 2018-10-30 (×4): qty 2

## 2018-10-30 NOTE — Progress Notes (Signed)
[facility] Progress Note Patient Name: Patricia Murillo DOB: 02-02-1946 MRN: [id number]   Date of Service  10/30/2018  HPI/Events of Note  Nursing concerned about need for Lasix with blood transfusion. Patient has CVL.   eICU Interventions  Will order: 1. Monitor CVP now and Q 4 hours. Will decide on need for diuresis based on CVP.      Intervention Category Major Interventions: Other:  Lysle Dingwall 10/30/2018, 10:43 PM

## 2018-10-30 NOTE — Progress Notes (Signed)
RT took out pt A-LINE due to it stop working. Pt had no adverse reaction to removable. Pt not on any pressors at this time. RN at bedside.

## 2018-10-30 NOTE — Progress Notes (Signed)
[facility] Progress Note Patient Name: Patricia Murillo DOB: 02/22/1946 MRN: [id number]   Date of Service  10/30/2018  HPI/Events of Note  Hgb 7.0  Goal per GI is Hgb of 8.0  eICU Interventions  Plan: Transfuse 1 unit pRBC Post-transfusion CBC     Intervention Category Intermediate Interventions: Bleeding - evaluation and treatment with blood products  Hilary Pundt 10/30/2018, 2:45 AM

## 2018-10-30 NOTE — Progress Notes (Signed)
[facility] Gastroenterology Progress Note  Patricia Murillo 73 y.o. [birth year]  CC: GI bleed   Subjective: Doing somewhat better.  Currently on weaning trial.  Had 2 bowel movements containing old blood.  ROS : Not able to obtain.   Objective: Vital signs in last 24 hours: Vitals:   10/30/18 0900 10/30/18 0951  BP:    Pulse: 91 94  Resp: 16 (!) 21  Temp:  98.5 F (36.9 C)  SpO2: 100% 100%    Physical Exam:  General.  Intubated, somewhat sedated. Respiratory.  Coarse breath sounds. Abdomen.  Soft, nontender, nondistended, bowel sounds present.  Lab Results: Recent Labs    10/23/2018 0855  10/19/2018 2044 10/30/18 0845  NA 138   < > 141 143  K 3.8   < > 3.4* 2.8*  CL 114*   < > 119* 118*  CO2 20*   < > 14* 18*  GLUCOSE 111*   < > 136* 112*  BUN 20   < > 24* 25*  CREATININE 0.36*   < > 1.07* 0.87  CALCIUM 7.4*   < > 6.4* 6.3*  MG 2.3  --   --   --   PHOS 2.2*  --   --   --    < > = values in this interval not displayed.   Recent Labs    10/18/2018 1356  AST 17  ALT 10  ALKPHOS 122  BILITOT 0.8  PROT 4.3*  ALBUMIN 1.9*   Recent Labs    11/01/2018 0635 10/17/2018 1041  11/02/2018 2044 10/30/18 0116 10/30/18 0845  WBC 18.5* 6.4  --  2.3*  --   --   NEUTROABS 14.2*  --   --  1.1*  --   --   HGB 8.0* 8.5*   < > 6.1* 7.0* 7.0*  HCT 26.7* 27.6*   < > 20.3* 21.9* 21.9*  MCV 98.2 95.5  --  96.7  --   --   PLT 205 78*  --  35*  --   --    < > = values in this interval not displayed.   No results for input(s): LABPROT, INR in the last 72 hours.    Assessment/Plan: -Upper GI bleed with coffee-ground emesis.  EGD yesterday showed 5 gastric ulcers in the body and fundus largest measuring 15 mm but there was no active bleeding. -Hypoxic respiratory failure.  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  Recommendations ------------------------- -EGD yesterday showed large amount of blood clots in the proximal  body of the stomach with possible ischemic changes in some part of gastric mucosa.  It also showed multiple large deeply cratered ulcers in the proximal body of the stomach. There was  diffuse oozing of blood from gastric mucosa rather than one source of bleeding.  Hemo-spray was applied. -Advancing scope towards gastric antrum was difficult and there was no significant blood clots or bleeding seen in the gastric antrum.  -She continues to have mild drop in hemoglobin. -Continue Protonix drip.  Continue supportive care. -Transfuse to keep hemoglobin around 8.  Discussed with family and critical care attending again today.   Otis Brace MD, [place] 10/30/2018, 10:39 AM  Contact #  [phone]

## 2018-10-30 NOTE — Progress Notes (Signed)
Name: Patricia Murillo. [name]  Location: 4315 ICU  Petra Kuba: Pastoral Care/Emergency Intubation  Summary: Visit took place 25th of January 20202. Chaplain paged because Pt was not doing well. Family at bedside while medical staff worked on stabilizing Pt.  According to medical staff, Pt intubated for respiratory failure. Family is [facility], and currently hoping for "healing." Chaplain prayed at bedside with family and staff.   During Chaplain's follow-up later that evening (on the 25th), family expressed appreciation for the extra support. Chaplain continues to keep this family on her radar through the weekend. If family seems to be in emotional/ spiritual distress please page the on-call again.

## 2018-10-30 NOTE — Progress Notes (Signed)
PROGRESS NOTE    Patricia Murillo  GHW:[id number] DOB: 1945/12/12 DOA: 10/12/2018 PCP: Patient, No Pcp Per   Brief Narrative:  73 year old with past medical history relevant for hypertension, pain, non-Hodgkin's lymphoma status post chemotherapy 10/24/2018 with R-CHOP, status post permanent pacemaker, sacral decubitus ulcer admitted from assisted living facility on 10/18/2018 with reports of hematemesis status post EGD showing cratered gastric ulcers.  Patient decompensated on 10/15/2018 and was intubated for acute hypoxemic respiratory failure due to aspiration pneumonia as well as hypotension due to persistent GI bleeding status post repeat EGD on 10/20/2018 showing only large blood clots and ischemic changes.   Assessment & Plan:   Active Problems:   Arthritis involving multiple sites   Complete heart block (HCC)   Lymphoma (HCC)   GI bleed   Acute hypoxemic respiratory failure ([place])  #) Acute hypoxemic respiratory failure/aspiration pneumonia: This morning patient is much more awake and alert.  She is on minimal ventilator settings and is currently undergoing a spontaneous breathing trial. -Continue IV Zosyn for likely aspiration pneumonia pneumonitis started 10/30/2018 -Continue IV fentanyl and midazolam for sedation -We will discuss with PCCM about possible extubation today  #) Hypotension: Resolved with fluid and blood administration as well as pressors.  Patient is been weaned off pressors this morning. -2 units packed red blood cells stat -GI consulted -Discontinue Levophed  #) Acute on chronic blood loss anemia: Patient continues to have significant anemia but no further episodes of hematemesis or hematochezia or melena -EGD on 10/12/2018 shows only cratered gastric ulcers, biopsies are pending Stat EGD on 10/28/2018 showed large amounts of blood clots in the stomach as well as ischemic changes and large deeply cratered ulcers -Status post 2 units packed red blood cells, repeat  pending -Continue PPI per GI recommendation -Continue iron supplementation  #) Decubitus ulcer: This apparently has been slow to heal which is not tremendously surprising considering the profound amount of immunosuppression she is on. -Wound care consult  #) Non-Hodgkin's lymphoma status post chemotherapy on 10/24/2018: Patient's tremendous white blood cell count is likely secondary to Neulasta. -Daily CBC with differential -Continue acyclovir prophylaxis -Continue ciprofloxacin prophylaxis -Continue Marinol 300 mg daily  #) Hypertension: -Discontinue/hold amlodipine 5 mg daily  #) Pain/psych: -Continue fentanyl patch 12 mcg every 3 days  #) Complete heart block status post permanent pacemaker: Last pacemaker interrogation showed no issues  Fluids: Blood administration Electrolytes: Monitor and supplement Nutrition: N.p.o.  Prophylaxis: SCDs  Disposition: Pending resolution of hypoxia/extubation and stability of hemoglobin  Full code    Consultants:   GI, [facility]  Procedures:  EGD 10/12/2018:The examined esophagus was normal. Findings: Five cratered gastric ulcers were found in the gastric fundus and in the gastric body. The largest lesion was 15 mm in largest dimension. Biopsies were taken with a cold forceps for histology. No active bleeding or visible vessels. The examined duodenum was normal. - Normal esophagus. - Gastric ulcers. Biopsied. - Normal examined duodenum. Impression: Moderate Sedation: - Clear liquid diet.  - Continue present medications. PPI   EGD 10/12/2018:Findings: Hematin (altered blood/coffee-ground-like material) was found in the gastric body. Diffuse severely friable mucosa with contact bleeding was found in the gastric body. Four non-obstructing oozing cratered gastric ulcers with pigmented material were found in the gastric body. The largest lesion was 15 mm in largest dimension. For hemostasis, hemostatic spray was deployed. Multiple  sprays were applied. There was no bleeding at the end of the procedure. There appears to be ischemic changes in some part of  gastric mucosa. Multiple large deeply cratered ulcers were seen in the proximal body of the stomach with evidence of recent bleeding. Large amount of blood clots in the stomach were suctioned using power suction. Subsequently hemo-spray was applied. Hematin (altered blood/coffee-ground-like material) was found in the entire duodenum. - No gross lesions in esophagus. - Hematin (altered blood/coffee-ground-like material) in the gastric body. - Friable gastric mucosa. - Non-obstructing oozing gastric ulcers with pigmented material. Hemostatic spray applied. - Blood in the entire examined duodenum. - No specimens collected. Marland Kitchen   Antimicrobials:   Ciprofloxacin started 10/24/2018 to 10/20/2018  IV Zosyn started 11/04/2018   Subjective: Patient did much better yesterday after stat EGD which showed only large amount of blood clots without any active bleeding as well as some ischemic changes.  Patient overnight required several units of packed red blood cells.  Objective: Vitals:   10/30/18 0500 10/30/18 0600 10/30/18 0637 10/30/18 0800  BP:      Pulse: 96 89 92 85  Resp: 16 15 17 17   Temp:   98.1 F (36.7 C) 97.8 F (36.6 C)  TempSrc:    Axillary  SpO2: 97% 97% 95% 97%  Weight:      Height:        Intake/Output Summary (Last 24 hours) at 10/30/2018 0929 Last data filed at 10/30/2018 0645 Gross per 24 hour  Intake 6070.22 ml  Output 595 ml  Net 5475.22 ml   Filed Weights   10/13/2018 0400 11/02/2018 0201 10/30/18 0258  Weight: 67.1 kg 68.5 kg 74.3 kg    Examination:  General exam: No acute distress, intubated but not sedated, responsive to commands Respiratory system: Coarse sounds worse on left, no increased work of breathing and breathing over ventilator, mechanically ventilated Cardiovascular system: Regular rate and rhythm, not  tachycardic Gastrointestinal system: Soft, nondistended, no rebound or guarding, plus bowel sounds.,  NG tube in place Central nervous system: Intubated but awake, responsive, moving all extremities Extremities: 1+ lower extremity edema. Skin: Port site is clean dry and intact, large contusions over her arms on the right, arterial line on right, central line placed on right Psychiatry: Unable to assess due to medical condition    Data Reviewed: I have personally reviewed following labs and imaging studies  CBC: Recent Labs  Lab 10/24/18 0845  10/08/2018 1356 10/10/2018 0855 10/09/2018 0635 11/01/2018 1041 10/15/2018 1716 10/26/2018 2044 10/30/18 0116 10/30/18 0845  WBC 19.8*  --  74.9* 26.3* 18.5* 6.4  --  2.3*  --   --   NEUTROABS 16.4*  --   --  23.1* 14.2*  --   --  1.1*  --   --   HGB 9.1*   < > 5.0* 7.9* 8.0* 8.5* 7.2* 6.1* 7.0* 7.0*  HCT 29.5*  --  16.4* 24.8* 26.7* 27.6* 23.6* 20.3* 21.9* 21.9*  MCV 90.5  --  96.5 92.2 98.2 95.5  --  96.7  --   --   PLT 530*  --  288 151 205 78*  --  35*  --   --    < > = values in this interval not displayed.   Basic Metabolic Panel: Recent Labs  Lab 10/21/2018 1356 11/02/2018 0855 10/23/2018 0635 10/18/2018 1041 10/28/2018 2044  NA 133* 138 143 141 141  K 4.6 3.8 3.7 4.7 3.4*  CL 107 114* 117* 119* 119*  CO2 16* 20* 17* 18* 14*  GLUCOSE 230* 111* 190* 106* 136*  BUN 29* 20 13 19  24*  CREATININE 0.45  0.36* 0.50 0.71 1.07*  CALCIUM 7.7* 7.4* 7.6* 6.5* 6.4*  MG  --  2.3  --   --   --   PHOS  --  2.2*  --   --   --    GFR: Estimated Creatinine Clearance: 50.8 mL/min (A) (by C-G formula based on SCr of 1.07 mg/dL (H)). Liver Function Tests: Recent Labs  Lab 10/24/18 0845 11/04/2018 1356  AST 18 17  ALT 10 10  ALKPHOS 191* 122  BILITOT 0.9 0.8  PROT 5.6* 4.3*  ALBUMIN 2.2* 1.9*   No results for input(s): LIPASE, AMYLASE in the last 168 hours. No results for input(s): AMMONIA in the last 168 hours. Coagulation Profile: No results for  input(s): INR, PROTIME in the last 168 hours. Cardiac Enzymes: No results for input(s): CKTOTAL, CKMB, CKMBINDEX, TROPONINI in the last 168 hours. BNP (last 3 results) No results for input(s): PROBNP in the last 8760 hours. HbA1C: No results for input(s): HGBA1C in the last 72 hours. CBG: No results for input(s): GLUCAP in the last 168 hours. Lipid Profile: Recent Labs    10/21/2018 0635  TRIG 138   Thyroid Function Tests: No results for input(s): TSH, T4TOTAL, FREET4, T3FREE, THYROIDAB in the last 72 hours. Anemia Panel: No results for input(s): VITAMINB12, FOLATE, FERRITIN, TIBC, IRON, RETICCTPCT in the last 72 hours. Sepsis Labs: Recent Labs  Lab 10/13/2018 1355 10/09/2018 1731  LATICACIDVEN 3.5* 1.5    Recent Results (from the past 240 hour(s))  MRSA PCR Screening     Status: None   Collection Time: 10/25/2018  6:19 PM  Result Value Ref Range Status   MRSA by PCR NEGATIVE NEGATIVE Final    Comment:        The GeneXpert MRSA Assay (FDA approved for NASAL specimens only), is one component of a comprehensive MRSA colonization surveillance program. It is not intended to diagnose MRSA infection nor to guide or monitor treatment for MRSA infections. Performed at [facility], [place] [address], [place], [place] [address]   Culture, respiratory (non-expectorated)     Status: None (Preliminary result)   Collection Time: 10/31/2018  9:03 AM  Result Value Ref Range Status   Specimen Description   Final    TRACHEAL ASPIRATE Performed at [facility] [address], [place], [place] [address]    Special Requests   Final    NONE Performed at [facility], [place] [address] [place], [place] [address]    Gram Stain   Final    NO WBC SEEN NO SQUAMOUS EPITHELIAL CELLS SEEN MODERATE BUDDING YEAST SEEN RARE GRAM POSITIVE RODS RARE GRAM NEGATIVE RODS Performed at [facility], [address] [place], [place] [address]     Culture PENDING  Incomplete   Report Status PENDING  Incomplete         Radiology Studies: Dg Chest Port 1 View  Result Date: 10/14/2018 CLINICAL DATA:  Central line placement. Former smoker. History of non-Hodgkin's lymphoma. EXAM: PORTABLE CHEST 1 VIEW COMPARISON:  10/17/2018. FINDINGS: Right chest wall pacer device is noted with leads in the right atrial appendage and right ventricle. Heart size appears normal. ET tube tip is above the carina. There is an enteric tube with tip in large hiatal hernia. Interval placement of right IJ catheter. The tip is in the projection of the SVC. No pneumothorax identified. Left midlung and left lower lobe airspace opacities identified, increased from previous exam. IMPRESSION: 1. No pneumothorax after right  IJ catheter placement. The tip projects over the SVC. 2. Worsening aeration to the left midlung and left base. Electronically Signed   By: Kerby Moors M.D.   On: 10/05/2018 09:24   Dg Chest Port 1 View  Result Date: 10/23/2018 CLINICAL DATA:  Endotracheal tube placement. EXAM: PORTABLE CHEST 1 VIEW COMPARISON:  Chest x-rays dated 10/07/2018 and 09/21/2017. FINDINGS: Endotracheal tube appears well positioned with tip approximately 4-5 cm above the level of the carina. RIGHT chest wall pacemaker leads appear stable in position. Stable cardiomegaly. Again noted is a large hiatal hernia. Probable atelectasis and/or small pleural effusion at the LEFT lung base. Lungs otherwise clear. Lucency at the RIGHT lung apex is similar to previous study in presumed to be due to overlying skin fold. No evidence of pneumothorax seen. IMPRESSION: 1. Endotracheal tube well positioned with tip approximately 4-5 cm above the level of the carina. 2. Small pleural effusion and/or atelectasis at the LEFT lung base. Lungs otherwise clear. No evidence of pneumonia or pulmonary edema. 3. Stable cardiomegaly. 4. Large hiatal hernia. Electronically Signed   By: Franki Cabot M.D.   On:  10/28/2018 05:22   Dg Abd Portable 1v  Result Date: 10/18/2018 CLINICAL DATA:  Orogastric tube placement EXAM: PORTABLE ABDOMEN - 1 VIEW COMPARISON:  08/31/2018 abdominal CT FINDINGS: Orogastric tube which loops through a hiatal hernia, tip and side port at the level of the diaphragm. Hazy lower lung opacities better evaluated on dedicated chest x-ray. The endotracheal tube tip is seen above the carina. Nonobstructive bowel gas pattern. IMPRESSION: Orogastric tube tip in the stomach (which is intrathoracic from large hiatal hernia). Electronically Signed   By: Monte Fantasia M.D.   On: 10/28/2018 07:54        Scheduled Meds: . sodium chloride   Intravenous Once  . sodium chloride   Intravenous Once  . acyclovir  400 mg Oral BID  . allopurinol  300 mg Oral Daily  . bisacodyl  10 mg Rectal Once  . chlorhexidine  15 mL Mouth Rinse BID  . Chlorhexidine Gluconate Cloth  6 each Topical Daily  . collagenase   Topical Daily  . mouth rinse  15 mL Mouth Rinse q12n4p  . mouth rinse  15 mL Mouth Rinse 10 times per day  . sodium chloride flush  10-40 mL Intracatheter Q12H   Continuous Infusions: . fentaNYL infusion INTRAVENOUS 75 mcg/hr (10/30/18 0645)  . norepinephrine (LEVOPHED) Adult infusion Stopped (10/30/18 0618)  . pantoprozole (PROTONIX) infusion    . phenylephrine (NEO-SYNEPHRINE) Adult infusion Stopped (10/20/2018 1500)  . piperacillin-tazobactam (ZOSYN)  IV 3.375 g (10/30/18 0602)  . propofol (DIPRIVAN) infusion Stopped (10/05/2018 0600)  .  sodium bicarbonate  infusion 1000 mL 100 mL/hr at 10/30/18 0603  . sodium chloride    . vasopressin (PITRESSIN) infusion - *FOR SHOCK* Stopped (10/30/18 0630)     LOS: 3 days    Time spent: [place], MD [facility]  If 7PM-7AM, please contact night-coverage [email or web address] Password [facility] 10/30/2018, 9:29 AM

## 2018-10-30 NOTE — Progress Notes (Addendum)
NAME:  Patricia Murillo, MRN:  [id number], DOB:  1945/11/10, LOS: 3 ADMISSION DATE:  10/13/2018, CONSULTATION DATE:  REFERRING MD:  Dr Wilson Singer, CHIEF COMPLAINT: Hematemesis  Brief History   Patient was brought into the hospital today for hematemesis this afternoon Described about 100 cc of bright red blood, brought in from a nursing home She did bring up a little amount about an hour ago, complaining of nausea She did decompensate overnight 11/03/2018 Nausea with vomiting of coffee grounds Aspirated and further decompensated,Intubated and on ventilator at present Patient rallied significantly overnight Off pressors, awake and interactive  History of present illness   Patient with a history of lymphoma, status post chemotherapy about 2 days ago Extensive disease from recent CT chest from 09/07/2089 She was recently hospitalized for severe anemia, left arm swelling Started [facility] and discharged to skilled nursing facility on 10/05/2018 She was doing well up until day of presentation when she had the episode of hematemesis She was not feeling acutely sick, did have associated nausea  Post EGD-revealing ulcers-nonbleeding Had coffee-ground emesis during the night Aspirated and went into respiratory failure requiring intubation Hypoxemic respiratory failure-was hemodynamically stable during the process but became hypotensive afterwards Awake and interactive  Past Medical History   Past Medical History:  Diagnosis Date  . Arthritis   . Hiatal hernia   . History of chicken pox   . Hyperlipidemia   . Non Hodgkin's lymphoma ([place])   . Pacemaker    Significant Hospital Events   Has remained edema dynamically stable 1/24-complaining of nausea vomiting, vomited a few times and aspirated, progressed to respiratory failure, required intubation.  Hemodynamic compromise following initial stabilization Significant amount of coffee grounds noted in endotracheal tube following  intubation 1/25-weaned off pressors.  Awake, interactive, moving all extremities  Consults:  GI-post endoscopy significant for multiple ulcers-nonbleeding Repeat endoscopy on 10/28/2018-showed gastric edema, hemospray.  Large amounts of altered blood noted in the stomach  Procedures:  10/11/2018 right IJ placed 10/30/2018 endotracheal tube placed  Significant Diagnostic Tests:  Last CT chest was 09/07/2018 showing progression of lymphoma, multiple masses and extensive adenopathy CT abdomen 08/31/2018 also did reveal extensive adenopathy Chest x-ray on 11/01/2018-no new infiltrative process, large hiatal hernia, tube appropriately positioned Chest x-ray post line placement noted-no pneumothorax, fluffy infiltrate on left  Micro Data:  None  Antimicrobials:  None Zosyn 10/05/2018>>  Interim history/subjective:  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 rate (!) 21, height 5\' 7"  (1.702 m), weight 74.3 kg, SpO2 100 %.    Vent Mode: PSV;CPAP FiO2 (%):  [30 %-80 %] 30 % Set Rate:  [16 bmp] 16 bmp Vt Set:  [490 mL] 490 mL PEEP:  [5 cmH20] 5 cmH20 Pressure Support:  [10 cmH20] 10 cmH20 Plateau Pressure:  [15 cmH20-18 cmH20] 15 cmH20   Intake/Output Summary (Last 24 hours) at 10/30/2018 1010 Last data filed at 10/30/2018 0645 Gross per 24 hour  Intake 6070.22 ml  Output 595 ml  Net 5475.22 ml   Filed Weights   10/09/2018 0400 11/04/2018 0201 10/30/18 0258  Weight: 67.1 kg 68.5 kg 74.3 kg    Examination: General: Elderly lady, comfortable HENT: Moist oral mucosa, endotracheal tube in place Lungs: Bilateral rhonchi, rales at the bases Cardiovascular: S1-S2 appreciated Abdomen: Bowel sounds plus Extremities: No clubbing, bilateral edema, upper extremity  edema Neuro: Awake and interactive GU: Good output  Resolved Hospital Problem list     Assessment & Plan:  Acute hypoxemic respiratory failure -Secondary to aspiration event -Continue ventilator support -Continue antibiotics-on Zosyn  -She did rally overnight -On pressure support and weaning well -She is extubateable both today  Aspiration pneumonia -Continue Zosyn  Shock -Secondary to acute acute GI bleed -Blood transfusion  -Successfully weaned off pressors -Continue support  Anemia -Secondary to acute GI bleed -Continue blood transfusion -Still requiring transfusions-we will try and maintain hemoglobin greater than 8  GI bleed -Dark stool still noted -GI following -Previous EGD had revealed ulcers that were not bleeding -Repeat EGD did show evidence of ischemia -Continue Protonix -Monitor H&H  Hypotension -Resolved  B-cell lymphoma on chemotherapy -Will involve hematology/oncology  Sacral pressure ulcers - History of third-degree heart block -Pacemaker in place  Leukocytosis -Related to Udenyca injection -  Hypocalcemia -We will replace with calcium gluconate  Acute hypoxemic respiratory failure secondary to aspiration GI bleed Discussed with family at bedside We will extubate today Continue antibiotics  Discussed with daughter at bedside  We will give her 1 dose of 20 mg Lasix-generalized edema  Critical care time spent evaluating and formulating plan of care, discussing with family members 30 minutes  Best practice:  Diet: N.p.o. Pain/Anxiety/Delirium protocol (if indicated):  VAP protocol (if indicated):  DVT prophylaxis: SCD, GI bleed GI prophylaxis: On Protonix Glucose control: ssi Mobility: Bedrest Code Status: Full code Family Communication: Discussed with family at bedside 10/30/2018 Disposition: Continue aggressive support  She is rallying and stable  Labs   CBC: Recent Labs  Lab 10/24/18 0845  11/01/2018 1356  10/11/2018 0855 10/11/2018 0635 10/19/2018 1041 10/16/2018 1716 10/08/2018 2044 10/30/18 0116 10/30/18 0845  WBC 19.8*  --  74.9* 26.3* 18.5* 6.4  --  2.3*  --   --   NEUTROABS 16.4*  --   --  23.1* 14.2*  --   --  1.1*  --   --   HGB 9.1*   < > 5.0* 7.9* 8.0* 8.5* 7.2* 6.1* 7.0* 7.0*  HCT 29.5*  --  16.4* 24.8* 26.7* 27.6* 23.6* 20.3* 21.9* 21.9*  MCV 90.5  --  96.5 92.2 98.2 95.5  --  96.7  --   --   PLT 530*  --  288 151 205 78*  --  35*  --   --    < > = values in this interval not displayed.    Basic Metabolic Panel: Recent Labs  Lab 10/15/2018 0855 10/07/2018 0635 10/14/2018 1041 10/18/2018 2044 10/30/18 0845  NA 138 143 141 141 143  K 3.8 3.7 4.7 3.4* 2.8*  CL 114* 117* 119* 119* 118*  CO2 20* 17* 18* 14* 18*  GLUCOSE 111* 190* 106* 136* 112*  BUN 20 13 19  24* 25*  CREATININE 0.36* 0.50 0.71 1.07* 0.87  CALCIUM 7.4* 7.6* 6.5* 6.4* 6.3*  MG 2.3  --   --   --   --   PHOS 2.2*  --   --   --   --    GFR: Estimated Creatinine Clearance: 62.5 mL/min (by C-G formula based on SCr of 0.87 mg/dL). Recent Labs  Lab 10/31/2018 1355  10/13/2018 0855 10/17/2018 0635 10/25/2018 1041 10/05/2018 1731 10/07/2018 2044  WBC  --    < > 26.3* 18.5* 6.4  --  2.3*  LATICACIDVEN 3.5*  --   --   --   --  1.5  --    < > =  values in this interval not displayed.    Liver Function Tests: Recent Labs  Lab 10/24/18 0845 10/26/2018 1356  AST 18 17  ALT 10 10  ALKPHOS 191* 122  BILITOT 0.9 0.8  PROT 5.6* 4.3*  ALBUMIN 2.2* 1.9*   No results for input(s): LIPASE, AMYLASE in the last 168 hours. No results for input(s): AMMONIA in the last 168 hours.  ABG    Component Value Date/Time   PHART 7.358 10/30/2018 0357   PCO2ART 31.7 (L) 10/30/2018 0357   PO2ART 107 10/30/2018 0357   HCO3 17.4 (L) 10/30/2018 0357   TCO2 27 04/18/2018 0838   ACIDBASEDEF 6.9 (H) 10/30/2018 0357   O2SAT 98.2 10/30/2018 0357     Coagulation Profile: No results for input(s): INR, PROTIME in the last 168 hours.  Cardiac  Enzymes: No results for input(s): CKTOTAL, CKMB, CKMBINDEX, TROPONINI in the last 168 hours.  HbA1C: Hgb A1c MFr Bld  Date/Time Value Ref Range Status  09/13/2012 11:24 AM 5.3 4.6 - 6.5 % Final    Comment:    Glycemic Control Guidelines for People with Diabetes:Non Diabetic:  <6%Goal of Therapy: <7%Additional Action Suggested:  >8%     CBG: No results for input(s): GLUCAP in the last 168 hours.  Review of Systems:   Review of Systems  Unable to perform ROS: Acuity of condition   Past Medical History  She,  has a past medical history of Arthritis, Hiatal hernia, History of chicken pox, Hyperlipidemia, Non Hodgkin's lymphoma ([place]), and Pacemaker.   Surgical History    Past Surgical History:  Procedure Laterality Date  . ANKLE SURGERY    . PACEMAKER IMPLANT N/A 04/18/2018   Procedure: PACEMAKER IMPLANT;  Surgeon: Evans Lance, MD;  Location: [facility];  Service: Cardiovascular;  Laterality: N/A;     Social History   reports that she quit smoking about 17 years ago. She has never used smokeless tobacco. She reports that she does not drink alcohol or use drugs.   Family History   Her family history includes Arthritis in her father and mother; Cancer in her sister; Heart disease in her paternal uncle; Hypertension in her mother.   Allergies Allergies  Allergen Reactions  . Apixaban Other (See Comments)    Rectal bleeding within 2 days of taking medication

## 2018-10-30 NOTE — Procedures (Signed)
Extubation Procedure Note  Patient Details:   Name: Patricia Murillo DOB: 05/17/1946 MRN: [id number]   Airway Documentation:    Vent end date: 10/30/18 Vent end time: 1042   Evaluation  O2 sats: stable throughout Complications: No apparent complications Patient did tolerate procedure well. Bilateral Breath Sounds: Clear, Diminished   Yes  Johnette Abraham 10/30/2018, 10:42 AM

## 2018-10-31 ENCOUNTER — Encounter (HOSPITAL_COMMUNITY): Payer: Self-pay | Admitting: Gastroenterology

## 2018-10-31 ENCOUNTER — Inpatient Hospital Stay (HOSPITAL_COMMUNITY): Payer: Medicare HMO

## 2018-10-31 DIAGNOSIS — E43 Unspecified severe protein-calorie malnutrition: Secondary | ICD-10-CM

## 2018-10-31 DIAGNOSIS — J9601 Acute respiratory failure with hypoxia: Secondary | ICD-10-CM

## 2018-10-31 DIAGNOSIS — D62 Acute posthemorrhagic anemia: Secondary | ICD-10-CM

## 2018-10-31 LAB — PHOSPHORUS
Phosphorus: 2.2 mg/dL — ABNORMAL LOW (ref 2.5–4.6)
Phosphorus: 2 mg/dL — ABNORMAL LOW (ref 2.5–4.6)

## 2018-10-31 LAB — BPAM RBC
BLOOD PRODUCT EXPIRATION DATE: 202002202359
BLOOD PRODUCT EXPIRATION DATE: 202002202359
Blood Product Expiration Date: 202002202359
Blood Product Expiration Date: 202002202359
Blood Product Expiration Date: 202002222359
Blood Product Expiration Date: 202002222359
Blood Product Expiration Date: 202002222359
Blood Product Expiration Date: 202002232359
Blood Product Expiration Date: 202002232359
Blood Product Expiration Date: 202002232359
ISSUE DATE / TIME: 202001231500
ISSUE DATE / TIME: 202001231824
ISSUE DATE / TIME: 202001240018
ISSUE DATE / TIME: 202001240303
ISSUE DATE / TIME: 202001250802
ISSUE DATE / TIME: 202001250802
ISSUE DATE / TIME: 202001252222
ISSUE DATE / TIME: 202001260424
ISSUE DATE / TIME: 202001261154
ISSUE DATE / TIME: 202001261458
UNIT TYPE AND RH: 5100
Unit Type and Rh: 5100
Unit Type and Rh: 5100
Unit Type and Rh: 5100
Unit Type and Rh: 5100
Unit Type and Rh: 5100
Unit Type and Rh: 5100
Unit Type and Rh: 5100
Unit Type and Rh: 5100
Unit Type and Rh: 5100

## 2018-10-31 LAB — CBC
HCT: 31.2 % — ABNORMAL LOW (ref 36.0–46.0)
HCT: 32.6 % — ABNORMAL LOW (ref 36.0–46.0)
Hemoglobin: 10.2 g/dL — ABNORMAL LOW (ref 12.0–15.0)
Hemoglobin: 10.5 g/dL — ABNORMAL LOW (ref 12.0–15.0)
MCH: 28.7 pg (ref 26.0–34.0)
MCH: 28.5 pg (ref 26.0–34.0)
MCHC: 32.7 g/dL (ref 30.0–36.0)
MCHC: 32.2 g/dL (ref 30.0–36.0)
MCV: 87.9 fL (ref 80.0–100.0)
MCV: 88.3 fL (ref 80.0–100.0)
Platelets: 21 10*3/uL — CL (ref 150–400)
Platelets: 52 10*3/uL — ABNORMAL LOW (ref 150–400)
RBC: 3.55 MIL/uL — ABNORMAL LOW (ref 3.87–5.11)
RBC: 3.69 MIL/uL — ABNORMAL LOW (ref 3.87–5.11)
RDW: 16.6 % — ABNORMAL HIGH (ref 11.5–15.5)
RDW: 16.8 % — ABNORMAL HIGH (ref 11.5–15.5)
WBC: 0.7 10*3/uL — CL (ref 4.0–10.5)
WBC: 0.6 K/uL — CL (ref 4.0–10.5)
nRBC: 0 % (ref 0.0–0.2)
nRBC: 0 % (ref 0.0–0.2)

## 2018-10-31 LAB — BRAIN NATRIURETIC PEPTIDE: B Natriuretic Peptide: 576.4 pg/mL — ABNORMAL HIGH (ref 0.0–100.0)

## 2018-10-31 LAB — BASIC METABOLIC PANEL
Anion gap: 8 (ref 5–15)
BUN: 12 mg/dL (ref 8–23)
CALCIUM: 9.4 mg/dL (ref 8.9–10.3)
CO2: 25 mmol/L (ref 22–32)
Chloride: 117 mmol/L — ABNORMAL HIGH (ref 98–111)
Creatinine, Ser: 0.5 mg/dL (ref 0.44–1.00)
GFR calc Af Amer: >60 mL/min (ref >60)
GLUCOSE: 77 mg/dL (ref 70–99)
Potassium: 2.2 mmol/L — CL (ref 3.5–5.1)
Sodium: 150 mmol/L — ABNORMAL HIGH (ref 135–145)

## 2018-10-31 LAB — BPAM PLATELET PHERESIS
Blood Product Expiration Date: 202001282359
Blood Product Expiration Date: 202001282359
ISSUE DATE / TIME: 202001262327
ISSUE DATE / TIME: 202001270100
Unit Type and Rh: 5100
Unit Type and Rh: 6200

## 2018-10-31 LAB — BLOOD GAS, ARTERIAL
Acid-base deficit: 0.2 mmol/L (ref 0.0–2.0)
Bicarbonate: 23.7 mmol/L (ref 20.0–28.0)
Drawn by: 232811
FIO2: 40
MECHVT: 490 mL
O2 SAT: 97.4 %
PEEP: 5 cmH2O
Patient temperature: 98
RATE: 16 resp/min
pCO2 arterial: 37.3 mmHg (ref 32.0–48.0)
pH, Arterial: 7.418 (ref 7.350–7.450)
pO2, Arterial: 89.3 mmHg (ref 83.0–108.0)

## 2018-10-31 LAB — TYPE AND SCREEN
ABO/RH(D): O POS
ABO/RH(D): O POS
Antibody Screen: NEGATIVE
Antibody Screen: NEGATIVE
Unit division: 0
Unit division: 0
Unit division: 0
Unit division: 0
Unit division: 0
Unit division: 0
Unit division: 0
Unit division: 0
Unit division: 0
Unit division: 0

## 2018-10-31 LAB — PREPARE FRESH FROZEN PLASMA: Unit division: 0

## 2018-10-31 LAB — BPAM FFP
Blood Product Expiration Date: 202001312359
Blood Product Expiration Date: 202001312359
ISSUE DATE / TIME: 202001262025
ISSUE DATE / TIME: 202001262129
Unit Type and Rh: 5100
Unit Type and Rh: 5100

## 2018-10-31 LAB — PREPARE PLATELET PHERESIS
Unit division: 0
Unit division: 0

## 2018-10-31 LAB — COMPREHENSIVE METABOLIC PANEL WITH GFR
CO2: 23 mmol/L (ref 22–32)
Calcium: 10.2 mg/dL (ref 8.9–10.3)
Total Protein: 5.5 g/dL — ABNORMAL LOW (ref 6.5–8.1)

## 2018-10-31 LAB — HEMOGLOBIN AND HEMATOCRIT, BLOOD
HCT: 27.4 % — ABNORMAL LOW (ref 36.0–46.0)
HCT: 26.6 % — ABNORMAL LOW (ref 36.0–46.0)
HEMOGLOBIN: 8.8 g/dL — AB (ref 12.0–15.0)
Hemoglobin: 8.6 g/dL — ABNORMAL LOW (ref 12.0–15.0)

## 2018-10-31 LAB — ECHOCARDIOGRAM COMPLETE
Height: 67 in
Weight: 2458.57 [oz_av]

## 2018-10-31 LAB — CULTURE, RESPIRATORY W GRAM STAIN: Gram Stain: NONE SEEN

## 2018-10-31 LAB — GLUCOSE, CAPILLARY
Glucose-Capillary: 67 mg/dL — ABNORMAL LOW (ref 70–99)
Glucose-Capillary: 95 mg/dL (ref 70–99)
Glucose-Capillary: 65 mg/dL — ABNORMAL LOW (ref 70–99)

## 2018-10-31 LAB — COMPREHENSIVE METABOLIC PANEL
ALT: 20 U/L (ref 0–44)
AST: 35 U/L (ref 15–41)
Albumin: 2.7 g/dL — ABNORMAL LOW (ref 3.5–5.0)
Alkaline Phosphatase: 126 U/L (ref 38–126)
Anion gap: 10 (ref 5–15)
BUN: 16 mg/dL (ref 8–23)
Chloride: 115 mmol/L — ABNORMAL HIGH (ref 98–111)
Creatinine, Ser: 0.64 mg/dL (ref 0.44–1.00)
GFR calc Af Amer: >60 mL/min (ref >60)
GFR calc non Af Amer: >60 mL/min (ref >60)
Glucose, Bld: 87 mg/dL (ref 70–99)
Potassium: 2.3 mmol/L — CL (ref 3.5–5.1)
Sodium: 148 mmol/L — ABNORMAL HIGH (ref 135–145)
Total Bilirubin: 1.4 mg/dL — ABNORMAL HIGH (ref 0.3–1.2)

## 2018-10-31 LAB — MAGNESIUM
MAGNESIUM: 1.4 mg/dL — AB (ref 1.7–2.4)
Magnesium: 1.6 mg/dL — ABNORMAL LOW (ref 1.7–2.4)
Magnesium: 1.5 mg/dL — ABNORMAL LOW (ref 1.7–2.4)

## 2018-10-31 LAB — TRIGLYCERIDES: Triglycerides: 126 mg/dL (ref <150)

## 2018-10-31 MED ORDER — DEXTROSE 50 % IV SOLN
INTRAVENOUS | Status: AC
  Administered 2018-10-31: 12.5 mL
  Filled 2018-10-31: qty 50

## 2018-10-31 MED ORDER — FUROSEMIDE 10 MG/ML IJ SOLN
INTRAMUSCULAR | Status: AC
  Filled 2018-10-31: qty 4

## 2018-10-31 MED ORDER — ORAL CARE MOUTH RINSE
15.0000 mL | OROMUCOSAL | Status: DC
  Administered 2018-10-31 – 2018-11-04 (×39): 15 mL via OROMUCOSAL

## 2018-10-31 MED ORDER — FREE WATER: 200.0000 mL | Status: DC

## 2018-10-31 MED ORDER — PROPOFOL 1000 MG/100ML IV EMUL
0.0000 ug/kg/min | INTRAVENOUS | Status: DC
  Administered 2018-10-31: 32.75 ug/kg/min via INTRAVENOUS
  Administered 2018-10-31 (×2): 30 ug/kg/min via INTRAVENOUS
  Administered 2018-10-31: 5 ug/kg/min via INTRAVENOUS
  Administered 2018-11-01 (×2): 25 ug/kg/min via INTRAVENOUS
  Administered 2018-11-02: 30 ug/kg/min via INTRAVENOUS
  Administered 2018-11-02: 25 ug/kg/min via INTRAVENOUS
  Administered 2018-11-02 (×2): 30 ug/kg/min via INTRAVENOUS
  Administered 2018-11-03: 25 ug/kg/min via INTRAVENOUS
  Filled 2018-10-31 (×10): qty 100

## 2018-10-31 MED ORDER — FLUCONAZOLE IN SODIUM CHLORIDE 400-0.9 MG/200ML-% IV SOLN
400.0000 mg | INTRAVENOUS | Status: AC
  Administered 2018-11-01 – 2018-11-06 (×6): 400 mg via INTRAVENOUS
  Filled 2018-10-31 (×7): qty 200

## 2018-10-31 MED ORDER — FLUCONAZOLE IN SODIUM CHLORIDE 400-0.9 MG/200ML-% IV SOLN
800.0000 mg | Freq: Once | INTRAVENOUS | Status: AC
  Administered 2018-10-31: 800 mg via INTRAVENOUS
  Filled 2018-10-31 (×2): qty 400

## 2018-10-31 MED ORDER — SODIUM PHOSPHATES 45 MMOLE/15ML IV SOLN: 20.0000 mmol | Freq: Once | INTRAVENOUS | Status: DC

## 2018-10-31 MED ORDER — POTASSIUM PHOSPHATES 15 MMOLE/5ML IV SOLN
20.0000 mmol | Freq: Once | INTRAVENOUS | Status: AC
  Administered 2018-10-31: 20 mmol via INTRAVENOUS
  Filled 2018-10-31: qty 6.67

## 2018-10-31 MED ORDER — POTASSIUM CHLORIDE 10 MEQ/50ML IV SOLN
10.0000 meq | INTRAVENOUS | Status: AC
  Administered 2018-10-31 – 2018-11-01 (×4): 10 meq via INTRAVENOUS
  Filled 2018-10-31 (×4): qty 50

## 2018-10-31 MED ORDER — PRO-STAT SUGAR FREE PO LIQD: 30.0000 mL | Freq: Two times a day (BID) | ORAL | Status: DC

## 2018-10-31 MED ORDER — POTASSIUM CHLORIDE 20 MEQ/15ML (10%) PO SOLN: 40.0000 meq | ORAL | Status: AC

## 2018-10-31 MED ORDER — MAGNESIUM SULFATE 2 GM/50ML IV SOLN
2.0000 g | Freq: Once | INTRAVENOUS | Status: AC
  Administered 2018-10-31: 2 g via INTRAVENOUS
  Filled 2018-10-31: qty 50

## 2018-10-31 MED ORDER — FENTANYL CITRATE (PF) 100 MCG/2ML IJ SOLN
50.0000 ug | INTRAMUSCULAR | Status: DC | PRN
  Administered 2018-11-01: 50 ug via INTRAVENOUS

## 2018-10-31 MED ORDER — VITAL AF 1.2 CAL PO LIQD: 1000.0000 mL | ORAL | Status: DC

## 2018-10-31 MED ORDER — PROPOFOL 1000 MG/100ML IV EMUL
INTRAVENOUS | Status: AC
  Filled 2018-10-31: qty 100

## 2018-10-31 MED ORDER — CHLORHEXIDINE GLUCONATE 0.12% ORAL RINSE (MEDLINE KIT)
15.0000 mL | Freq: Two times a day (BID) | OROMUCOSAL | Status: DC
  Administered 2018-10-31 – 2018-11-04 (×9): 15 mL via OROMUCOSAL

## 2018-10-31 MED ORDER — DEXTROSE 50 % IV SOLN
INTRAVENOUS | Status: AC
  Administered 2018-10-31: 25 mL
  Filled 2018-10-31: qty 50

## 2018-10-31 MED ORDER — FUROSEMIDE 10 MG/ML IJ SOLN
40.0000 mg | Freq: Once | INTRAMUSCULAR | Status: AC
  Administered 2018-10-31: 40 mg via INTRAVENOUS

## 2018-10-31 MED ORDER — PRO-STAT SUGAR FREE PO LIQD: 30.0000 mL | Freq: Every day | ORAL | Status: DC

## 2018-10-31 MED ORDER — VITAL HIGH PROTEIN PO LIQD: 1000.0000 mL | ORAL | Status: DC

## 2018-10-31 MED ORDER — ALBUTEROL SULFATE (2.5 MG/3ML) 0.083% IN NEBU
2.5000 mg | INHALATION_SOLUTION | RESPIRATORY_TRACT | Status: DC | PRN
  Administered 2018-10-31: 2.5 mg via RESPIRATORY_TRACT
  Filled 2018-10-31: qty 3

## 2018-10-31 MED ORDER — FENTANYL CITRATE (PF) 100 MCG/2ML IJ SOLN
50.0000 ug | INTRAMUSCULAR | Status: DC | PRN
  Administered 2018-10-31 – 2018-11-03 (×3): 50 ug via INTRAVENOUS
  Filled 2018-10-31 (×2): qty 2

## 2018-10-31 MED ORDER — POTASSIUM CHLORIDE 10 MEQ/100ML IV SOLN
10.0000 meq | INTRAVENOUS | Status: AC
  Administered 2018-10-31 (×6): 10 meq via INTRAVENOUS
  Filled 2018-10-31 (×6): qty 100

## 2018-10-31 NOTE — Care Management Note (Signed)
Case Management Note  Patient Details  Name: MARIAFERNANDA HENDRICKSEN MRN: [id number] Date of Birth: [birth year]  Subjective/Objective:                  Discharge Readiness Return to top of Gastrointestinal Bleeding, [facility] [place]  Discharge readiness is indicated by patient meeting Recovery Milestones, including ALL of the following: ? Hypotension absent no has a.lines in for monitoring on iv levophed ? Surgical and other acute interventions not needed pending ? Bleeding absent  hgb 3.6 on 10/31/2020/rec'ing-2 units of prbcs ? Stable Hgb/Hct no ? Pain absent or managed no ? Ambulatory on on full vent support and sedated ? Oral hydration, medications, and diet ? Npo,Calicum gluconate iv/iv fentanyl/iv levophed/iv protonix/ iv neo-synphrine/iv zosyn/ iv pitressin, iv diprivan ? Level of care -icu   Action/Plan: Following for progression of care and condition. Following for cm needs none present at this time.  Expected Discharge Date:  (unknown)               Expected Discharge Plan:     In-House Referral:     Discharge planning Services     Post Acute Care Choice:    Choice offered to:     DME Arranged:    DME Agency:     HH Arranged:    HH Agency:     Status of Service:     If discussed at [employer], dates discussed:    Additional Comments:  Leeroy Cha, RN 10/31/2018, 9:38 AM

## 2018-10-31 NOTE — Progress Notes (Signed)
PROGRESS NOTE    Patricia Murillo  CHE:[id number] DOB: 1946-06-08 DOA: 10/23/2018 PCP: Patient, No Pcp Per   Brief Narrative:  73 year old with past medical history relevant for hypertension, pain, non-Hodgkin's lymphoma status post chemotherapy 10/24/2018 with R-CHOP, status post permanent pacemaker, sacral decubitus ulcer admitted from assisted living facility on 10/25/2018 with reports of hematemesis status post EGD showing cratered gastric ulcers.  Patient decompensated on 10/26/2018 and was intubated for acute hypoxemic respiratory failure due to aspiration pneumonia as well as hypotension due to persistent GI bleeding status post repeat EGD on 10/17/2018 showing only large blood clots and ischemic changes.   Assessment & Plan:   Active Problems:   Arthritis involving multiple sites   Complete heart block (HCC)   Lymphoma (HCC)   GI bleed   Acute hypoxemic respiratory failure ([place])  #) Acute hypoxemic respiratory failure/aspiration pneumonia: This morning patient again required intubation for persistent hypoxia and increased work of breathing.  Chest x-ray showed pleural effusions, bilateral infiltrates worse on the left with dense consolidation as well as evidence of possible mild fluid overload.  Likely combination of transfusion associated circulatory overload due to significant amounts of resuscitation with blood products as well as aspiration pneumonia. -Continue IV Zosyn for likely aspiration pneumonia pneumonitis started 10/14/2018 -Continue IV fentanyl and midazolam for sedation -Patient receiving intermittent IV furosemide for possible fluid overload -Continue SBT and SAT but will keep patient intubated -We will check BNP  #) Hypernatremia: Likely in the setting of significant diuresis -Hold diuresis at this time  #) Hypotension: Resolved with resuscitation.  Repeat EGD showed only evidence of leaking -GI consulted -Discontinue Levophed  #) Acute on chronic blood loss  anemia: Resolved with resuscitation -EGD on 10/19/2018 shows only cratered gastric ulcers, biopsies are pending Stat EGD on 10/23/2018 showed large amounts of blood clots in the stomach as well as ischemic changes and large deeply cratered ulcers -Status post 2 units packed red blood cells, repeat pending -Continue PPI per GI recommendation -Continue iron supplementation  #) Thrombocytopenia/leukopenia: In the setting of significant amounts of packed red blood cells as well as recent chemotherapy -Status post 2 units of platelets and product  #) Decubitus ulcer: This apparently has been slow to heal which is not tremendously surprising considering the profound amount of immunosuppression she is on. -Wound care consult  #) Non-Hodgkin's lymphoma status post chemotherapy on 10/24/2018: Patient's tremendous white blood cell count is likely secondary to Neulasta. -Daily CBC with differential -Continue acyclovir prophylaxis -Continue ciprofloxacin prophylaxis -Continue Marinol 300 mg daily  #) Hypertension: -Discontinue/hold amlodipine 5 mg daily  #) Pain/psych: -Continue fentanyl patch 12 mcg every 3 days  #) Complete heart block status post permanent pacemaker: Last pacemaker interrogation showed no issues  Fluids: Blood administration Electrolytes: Monitor and supplement Nutrition: N.p.o.  Prophylaxis: SCDs  Disposition: Pending resolution of hypoxia/extubation and stability of hemoglobin  Full code    Consultants:   GI, [facility]  Procedures:  EGD 10/20/2018:The examined esophagus was normal. Findings: Five cratered gastric ulcers were found in the gastric fundus and in the gastric body. The largest lesion was 15 mm in largest dimension. Biopsies were taken with a cold forceps for histology. No active bleeding or visible vessels. The examined duodenum was normal. - Normal esophagus. - Gastric ulcers. Biopsied. - Normal examined duodenum. Impression: Moderate Sedation: -  Clear liquid diet.  - Continue present medications. PPI   EGD 10/15/2018:Findings: Hematin (altered blood/coffee-ground-like material) was found in the gastric body. Diffuse severely friable mucosa  with contact bleeding was found in the gastric body. Four non-obstructing oozing cratered gastric ulcers with pigmented material were found in the gastric body. The largest lesion was 15 mm in largest dimension. For hemostasis, hemostatic spray was deployed. Multiple sprays were applied. There was no bleeding at the end of the procedure. There appears to be ischemic changes in some part of gastric mucosa. Multiple large deeply cratered ulcers were seen in the proximal body of the stomach with evidence of recent bleeding. Large amount of blood clots in the stomach were suctioned using power suction. Subsequently hemo-spray was applied. Hematin (altered blood/coffee-ground-like material) was found in the entire duodenum. - No gross lesions in esophagus. - Hematin (altered blood/coffee-ground-like material) in the gastric body. - Friable gastric mucosa. - Non-obstructing oozing gastric ulcers with pigmented material. Hemostatic spray applied. - Blood in the entire examined duodenum. - No specimens collected. . Intubated 10/12/2018 to 10/30/2018 Intubated 10/31/2020 ongoing  Antimicrobials:   Ciprofloxacin started 10/06/2018 to 10/26/2018  IV Zosyn started 10/18/2018   Subjective: Patient overnight developed respiratory distress and required BiPAP.  Chest x-ray showed evidence of pleural effusions as well as possible vascular congestion and worsening infiltrates on right.  Patient was intubated on early a.m. on 10/31/2018 for acute hypoxic respiratory failure again.  This morning she is sedated but arousable and responsive to commands.  Objective: Vitals:   10/31/18 0500 10/31/18 0600 10/31/18 0700 10/31/18 0747  BP:  (!) 123/58 (!) 122/59   Pulse: 92 86 81   Resp: _0 Temp:        TempSrc:      SpO2: 98% 100% 100% 100%  Weight: 69.7 kg     Height:        Intake/Output Summary (Last 24 hours) at 10/31/2018 0841 Last data filed at 10/31/2018 0559 Gross per 24 hour  Intake 4612.09 ml  Output 7210 ml  Net -2597.91 ml   Filed Weights   10/18/2018 0201 10/30/18 0258 10/31/18 0500  Weight: 68.5 kg 74.3 kg 69.7 kg    Examination:  General exam: No acute distress, intubated but not sedated, responsive to commands Respiratory system: Coarse sounds bilaterally worse on left, no increased work of breathing and breathing over ventilator, mechanically ventilated Cardiovascular system: Regular rate and rhythm, not tachycardic Gastrointestinal system: Soft, nondistended, no rebound or guarding, plus bowel sounds.,  NG tube in place Central nervous system: Intubated but awake, responsive, moving all extremities Extremities: 1+ lower extremity edema. Skin: Port site is clean dry and intact, large contusions over her arms on the right, arterial line on right, central line placed on right Psychiatry: Unable to assess due to medical condition    Data Reviewed: I have personally reviewed following labs and imaging studies  CBC: Recent Labs  Lab 10/24/18 0845  10/19/2018 0855 10/20/2018 0635 10/12/2018 1041  11/04/2018 2044 10/30/18 0116 10/30/18 0845 10/30/18 1558 10/31/18 0310  WBC 19.8*   < > 26.3* 18.5* 6.4  --  2.3*  --   --  0.7* 0.6*  NEUTROABS 16.4*  --  23.1* 14.2*  --   --  1.1*  --   --   --   --   HGB 9.1*   < > 7.9* 8.0* 8.5*   < > 6.1* 7.0* 7.0* 10.2* 10.5*  HCT 29.5*   < > 24.8* 26.7* 27.6*   < > 20.3* 21.9* 21.9* 31.2* 32.6*  MCV 90.5   < > 92.2 98.2 95.5  --  96.7  --   --  87.9 88.3  PLT 530*   < > 151 205 78*  --  35*  --   --  21* 52*   < > = values in this interval not displayed.   Basic Metabolic Panel: Recent Labs  Lab 10/05/2018 0855 10/15/2018 0635 10/07/2018 1041 10/17/2018 2044 10/30/18 0845 10/31/18 0310  NA 138 143 141 141 143 148*  K 3.8 3.7  4.7 3.4* 2.8* 2.3*  CL 114* 117* 119* 119* 118* 115*  CO2 20* 17* 18* 14* 18* 23  GLUCOSE 111* 190* 106* 136* 112* 87  BUN _0 24* 25* 16  CREATININE 0.36* 0.50 0.71 1.07* 0.87 0.64  CALCIUM 7.4* 7.6* 6.5* 6.4* 6.3* 10.2  MG 2.3  --   --   --   --  1.6*  PHOS 2.2*  --   --   --   --   --    GFR: Estimated Creatinine Clearance: 62.7 mL/min (by C-G formula based on SCr of 0.64 mg/dL). Liver Function Tests: Recent Labs  Lab 10/24/18 0845 10/22/2018 1356 10/31/18 0310  AST 18 17 35  ALT _1 ALKPHOS 191* 122 126  BILITOT 0.9 0.8 1.4*  PROT 5.6* 4.3* 5.5*  ALBUMIN 2.2* 1.9* 2.7*   No results for input(s): LIPASE, AMYLASE in the last 168 hours. No results for input(s): AMMONIA in the last 168 hours. Coagulation Profile: Recent Labs  Lab 10/30/18 1558  INR 1.51   Cardiac Enzymes: No results for input(s): CKTOTAL, CKMB, CKMBINDEX, TROPONINI in the last 168 hours. BNP (last 3 results) No results for input(s): PROBNP in the last 8760 hours. HbA1C: No results for input(s): HGBA1C in the last 72 hours. CBG: No results for input(s): GLUCAP in the last 168 hours. Lipid Profile: Recent Labs    10/11/2018 0635 10/31/18 0310  TRIG 138 126   Thyroid Function Tests: No results for input(s): TSH, T4TOTAL, FREET4, T3FREE, THYROIDAB in the last 72 hours. Anemia Panel: No results for input(s): VITAMINB12, FOLATE, FERRITIN, TIBC, IRON, RETICCTPCT in the last 72 hours. Sepsis Labs: Recent Labs  Lab 10/25/2018 1355 10/07/2018 1731  LATICACIDVEN 3.5* 1.5    Recent Results (from the past 240 hour(s))  MRSA PCR Screening     Status: None   Collection Time: 11/04/2018  6:19 PM  Result Value Ref Range Status   MRSA by PCR NEGATIVE NEGATIVE Final    Comment:        The GeneXpert MRSA Assay (FDA approved for NASAL specimens only), is one component of a comprehensive MRSA colonization surveillance program. It is not intended to diagnose MRSA infection nor to guide or monitor  treatment for MRSA infections. Performed at [facility], [place] [address], [place], [place] [address]   Culture, respiratory (non-expectorated)     Status: None (Preliminary result)   Collection Time: 10/17/2018  9:03 AM  Result Value Ref Range Status   Specimen Description   Final    TRACHEAL ASPIRATE Performed at [facility] [address] [place], [place] [address]    Special Requests   Final    NONE Performed at [facility], [place] [address] [place], [place] [address]    Gram Stain   Final    NO WBC SEEN NO SQUAMOUS EPITHELIAL CELLS SEEN MODERATE BUDDING YEAST SEEN RARE GRAM POSITIVE RODS RARE GRAM NEGATIVE RODS Performed at [facility], [address], [place],  [address]    Culture MODERATE YEAST  Final  Report Status PENDING  Incomplete         Radiology Studies: Dg Chest 1 View  Result Date: 10/31/2018 CLINICAL DATA:  Endotracheal tube placement EXAM: CHEST  1 VIEW COMPARISON:  10/31/2018 chest radiograph FINDINGS: Endotracheal tube tip is at the level of the clavicular heads. Orogastric tube tip and side port project over the left heart, likely within the hiatal hernia. Right internal jugular vein approach central venous catheter tip projects over the upper SVC. Unchanged position of right chest wall AICD leads. Bibasilar atelectasis and pleural effusions. Unchanged left basilar consolidation. IMPRESSION: 1. Endotracheal tube tip at the level of the clavicular heads. 2. Orogastric tube tip and side port project over the left heart, likely within the hiatal hernia. 3. Right IJ approach CVC tip projects over the upper SVC. 4. Left basilar consolidation, bilateral pleural effusions and bibasilar atelectasis. Electronically Signed   By: Ulyses Jarred M.D.   On: 10/31/2018 05:33   Dg Abd 1 View  Result Date: 10/31/2018 CLINICAL DATA:  Advancement of OG tube EXAM: ABDOMEN - 1 VIEW COMPARISON:  Earlier  same day; chest radiograph-earlier same day; CT abdomen pelvis-08/31/2018 FINDINGS: Caudal aspect of enteric tube appears coiled over lying the expected location of the known large hiatal hernia. Paucity of bowel gas without evidence of enteric obstruction. Pacer leads overlie the lower chest. Suspected small to moderate size left-sided effusion with associated left basilar opacities, atelectasis versus infiltrate. No supine evidence of pneumoperitoneum. No definite pneumatosis or portal venous gas. No definitive abnormal intra-abdominal calcifications. No acute osseus abnormalities. Degenerative change of the lower lumbar spine. IMPRESSION: 1. Enteric tube appears coiled overlying the known large hiatal hernia demonstrated on CT of the abdomen and pelvis performed 08/31/2018. 2. Small to moderate size left-sided effusion with associated left basilar opacities, atelectasis versus infiltrate. Electronically Signed   By: Sandi Mariscal M.D.   On: 10/31/2018 08:02   Dg Chest Port 1 View  Result Date: 10/31/2018 CLINICAL DATA:  Shortness of breath EXAM: PORTABLE CHEST 1 VIEW COMPARISON:  10/30/2018, 11/02/2018, 09/21/2018 FINDINGS: Right-sided pacing device as before. Small moderate bilateral pleural effusions with bibasilar airspace disease. Cardiomegaly. Central vascular congestion. Large hiatal hernia. IMPRESSION: 1. Small moderate bilateral pleural effusions, new on the right side with worsened airspace disease at the right base and persistent dense left lung base consolidation. 2. Cardiomegaly with vascular congestion 3. Large hiatal hernia Electronically Signed   By: Donavan Foil M.D.   On: 10/31/2018 02:03   Dg Chest Port 1 View  Result Date: 10/30/2018 CLINICAL DATA:  Respiratory failure with aspiration pneumonia and hematemesis. EXAM: PORTABLE CHEST 1 VIEW COMPARISON:  11/03/2018 FINDINGS: ET tube tip is above the carina. Right IJ catheter tip is projecting over the SVC. Right chest wall pacer device is  noted with leads in the right atrial appendage and right ventricle. Stable heart size. Large hiatal hernia appears unchanged. Airspace disease within the left midlung and left base is again identified and appears unchanged. Pulmonary edema is decreased in the interval. IMPRESSION: 1. Persistent left midlung and left base airspace densities. 2. Decrease in pulmonary edema. Electronically Signed   By: Kerby Moors M.D.   On: 10/30/2018 11:02   Dg Chest Port 1 View  Result Date: 10/20/2018 CLINICAL DATA:  Central line placement. Former smoker. History of non-Hodgkin's lymphoma. EXAM: PORTABLE CHEST 1 VIEW COMPARISON:  10/31/2018. FINDINGS: Right chest wall pacer device is noted with leads in the right atrial appendage and right ventricle. Heart size appears normal.  ET tube tip is above the carina. There is an enteric tube with tip in large hiatal hernia. Interval placement of right IJ catheter. The tip is in the projection of the SVC. No pneumothorax identified. Left midlung and left lower lobe airspace opacities identified, increased from previous exam. IMPRESSION: 1. No pneumothorax after right IJ catheter placement. The tip projects over the SVC. 2. Worsening aeration to the left midlung and left base. Electronically Signed   By: Kerby Moors M.D.   On: 11/04/2018 09:24   Dg Abd Portable 1v  Result Date: 10/31/2018 CLINICAL DATA:  OG tube placement EXAM: PORTABLE ABDOMEN - 1 VIEW COMPARISON:  10/21/2018; chest radiograph - earlier same day; CT abdomen and pelvis - 08/31/2018 FINDINGS: Enteric tube not identified, however overlies expected location of the large hiatal hernia on chest radiograph performed earlier same day. Paucity of bowel gas without evidence of enteric obstruction. Pacer leads overlie the lower chest. No supine evidence of pneumoperitoneum. No definite pneumatosis or portal venous gas. No definitive abnormal intra-abdominal calcifications. No acute osseus abnormalities. Degenerative  change of the lower lumbar spine. IMPRESSION: Enteric tube not identified, however overlies the expected location of the known large hiatal hernia demonstrated on chest radiograph performed earlier same day. Large hiatal hernia was noted to contain near the entirety of the stomach on CT the abdomen pelvis performed 08/31/2018. Electronically Signed   By: Sandi Mariscal M.D.   On: 10/31/2018 08:00        Scheduled Meds: . sodium chloride   Intravenous Once  . acyclovir  400 mg Oral BID  . allopurinol  300 mg Oral Daily  . bisacodyl  10 mg Rectal Once  . chlorhexidine gluconate (MEDLINE KIT)  15 mL Mouth Rinse BID  . Chlorhexidine Gluconate Cloth  6 each Topical Daily  . collagenase   Topical Daily  . furosemide      . mouth rinse  15 mL Mouth Rinse 10 times per day  . sodium chloride flush  10-40 mL Intracatheter Q12H   Continuous Infusions: . calcium gluconate infusion(930 mg elemental calcium/L) 1 mg elemental calcium/kg/hr (10/30/18 2152)  . fentaNYL infusion INTRAVENOUS Stopped (10/30/18 0730)  . norepinephrine (LEVOPHED) Adult infusion Stopped (10/30/18 0618)  . pantoprozole (PROTONIX) infusion 8 mg/hr (10/31/18 0559)  . phenylephrine (NEO-SYNEPHRINE) Adult infusion Stopped (10/21/2018 1500)  . piperacillin-tazobactam (ZOSYN)  IV 12.5 mL/hr at 10/31/18 0559  . potassium chloride 10 mEq (10/31/18 0826)  . propofol    . propofol (DIPRIVAN) infusion 35 mcg/kg/min (10/31/18 3893)  . sodium chloride    . vasopressin (PITRESSIN) infusion - *FOR SHOCK* Stopped (10/30/18 0630)     LOS: 4 days    Time spent: [place], MD [facility]  If 7PM-7AM, please contact night-coverage [email or web address] Password [facility] 10/31/2018, 8:41 AM

## 2018-10-31 NOTE — Progress Notes (Signed)
Pharmacy Antibiotic Note  Patricia Murillo is a 73 y.o. female admitted on 10/26/2018 with hematemesis. Received chemo `~2 days ago Pharmacy has been consulted for fluconazole dosing for candidal pna.  Plan: Fluconazole 800mg  x 1 already given today Continue with fluconazole 400mg  IV q24h thereafter Follow culture and renal function  Height: 5\' 7"  (170.2 cm) Weight: 153 lb 10.6 oz (69.7 kg) IBW/kg (Calculated) : 61.6  Temp (24hrs), Avg:97.2 F (36.2 C), Min:96.3 F (35.7 C), Max:97.9 F (36.6 C)  Recent Labs  Lab 10/21/2018 1355  11/04/2018 0635 10/23/2018 1041 11/03/2018 1731 10/20/2018 2044 10/30/18 0845 10/30/18 1558 10/31/18 0310  WBC  --    < > 18.5* 6.4  --  2.3*  --  0.7* 0.6*  CREATININE  --    < > 0.50 0.71  --  1.07* 0.87  --  0.64  LATICACIDVEN 3.5*  --   --   --  1.5  --   --   --   --    < > = values in this interval not displayed.    Estimated Creatinine Clearance: 62.7 mL/min (by C-G formula based on SCr of 0.64 mg/dL).    Allergies  Allergen Reactions  . Apixaban Other (See Comments)    Rectal bleeding within 2 days of taking medication     Thank you for allowing pharmacy to be a part of this patient's care.  Dolly Rias RPh 10/31/2018, 4:20 PM Pager [phone]

## 2018-10-31 NOTE — Progress Notes (Signed)
  Echocardiogram 2D Echocardiogram has been performed.  Patricia Murillo L Androw 10/31/2018, 3:24 PM

## 2018-10-31 NOTE — Progress Notes (Signed)
[facility] Progress Note Patient Name: Patricia Murillo DOB: 01-20-1946 MRN: [id number]   Date of Service  10/31/2018  HPI/Events of Note  Respiratory Status not improved with about 2 liter diuresis. Patient refuses to wear BiPAP. Remains hypertensive and tachypnic.BP = 167/87 with MAP = 107 and RR = 30. I am concerned that she has not improved with good diuresis and will require intubation and ventilation.   eICU Interventions  Will ask ground team to evaluate at bedside.         [name] Cornelia Copa 10/31/2018, 3:42 AM

## 2018-10-31 NOTE — Progress Notes (Addendum)
Pt intubated sedation for intubation includes: 4 mg IV Versed 50 mcg IV Fentanyl given during rapid sedation. Additional 50 mcg given IV push to maintain RASS. Unable to waste.  20 mg IV Amidate Meds all witnessed by Jennye Moccasin, RN

## 2018-10-31 NOTE — Progress Notes (Signed)
[facility] Progress Note Patient Name: Patricia Murillo DOB: 19-Jun-1946 MRN: [id number]   Date of Service  10/31/2018  HPI/Events of Note  CXR appears c/w pulmonary edema to my read.   eICU Interventions  Will order: 1. Lasix 40 mg IV X 1 now.      Intervention Category Major Interventions: Other:  [name],[name] Cornelia Copa 10/31/2018, 1:42 AM

## 2018-10-31 NOTE — Progress Notes (Signed)
Fent. Drip dc'd ...wasted 190 cc (10 mcg/ml) with [name]

## 2018-10-31 NOTE — Progress Notes (Signed)
/  [facility] Progress Note Patient Name: ZAKARIA FROMER DOB: Dec 28, 1945 MRN: [id number]   Date of Service  10/31/2018  HPI/Events of Note  ABG on 40%/PRVC 16/TV 490/P 5 = 7.41/37/89/23  eICU Interventions  Continue present ventilator management.      Intervention Category Major Interventions: Respiratory failure - evaluation and management  [name],[name] Eugene 10/31/2018, 6:28 AM

## 2018-10-31 NOTE — Procedures (Signed)
Intubation Procedure Note HAWRAA STAMBAUGH [id number] 1946/08/01   I was called to assess the patient due to increased respiratory distress she was given IV Lasix with significant diuresis but no improvement in her respiratory status she could not tolerate BiPAP for more than 1 hour when I came to assess the patient patient was having weak cough looking in distress unable to control her secretions or maintain her airway so decision was made to intubate the patient after discussing with the patient and her daughter at bedside.  Procedure: Intubation Indications: Respiratory insufficiency acute hypoxemic respiratory failure  Procedure Details Consent: Risks of procedure as well as the alternatives and risks of each were explained to the (patient/caregiver).  Consent for procedure obtained. Time Out: Verified patient identification, verified procedure, site/side was marked, verified correct patient position, special equipment/implants available, medications/allergies/relevent history reviewed, required imaging and test results available.  Performed  Maximum sterile technique was used including gloves and mask.  MAC and 3 glide scope was used with 7.5 endotracheal tube and direct visualization of the vocal cords from first attempt endotracheal tube was stabilized at 23 cm at the lips with good color exchange on the CO2 detector and good air sounds bilaterally.    Evaluation Hemodynamic Status: BP stable throughout; O2 sats: stable throughout Patient's Current Condition: stable Complications: No apparent complications Patient did tolerate procedure well. Chest X-ray ordered to verify placement.  CXR: pending.   Estill Batten Z Mickell Birdwell 10/31/2018

## 2018-10-31 NOTE — Progress Notes (Signed)
NATSUMI WHITSITT   DOB:Apr 27, 1946   HU#:[id number]   ZCH#:[id number]  Hematology and oncology follow-up note  Subjective: Chart reviewed, events noticed. Pt is current intubated, and sedated, family not around. I spoke with her ICU nurse. She is hemodynamically stable, not on pressor.  Objective:  Vitals:   10/31/18 1950 10/31/18 2000  BP:  (!) 130/59  Pulse:  86  Resp:  17  Temp: 98.1 F (36.7 C)   SpO2:  96%    Body mass index is 24.07 kg/m.  Intake/Output Summary (Last 24 hours) at 10/31/2018 2043 Last data filed at 10/31/2018 1900 Gross per 24 hour  Intake 3123.91 ml  Output 5185 ml  Net -2061.09 ml     Pt intubated and sedated   Lungs clear -- no rales or rhonchi  Heart regular rate and rhythm  Abdomensoft  Skin: Diffuse ecchymosis on bilateral arms   CBG (last 3)  Recent Labs    10/31/18 1919  GLUCAP 67*     Labs:  Lab Results  Component Value Date   WBC 0.6 (LL) 10/31/2018   HGB 8.6 (L) 10/31/2018   HCT 26.6 (L) 10/31/2018   MCV 88.3 10/31/2018   PLT 52 (L) 10/31/2018   NEUTROABS 1.1 (L) 10/24/2018   CMP Latest Ref Rng & Units 10/31/2018 10/30/2018 10/09/2018  Glucose 70 - 99 mg/dL 87 112(H) 136(H)  BUN 8 - 23 mg/dL 16 25(H) 24(H)  Creatinine 0.44 - 1.00 mg/dL 0.64 0.87 1.07(H)  Sodium 135 - 145 mmol/L 148(H) 143 141  Potassium 3.5 - 5.1 mmol/L 2.3(LL) 2.8(L) 3.4(L)  Chloride 98 - 111 mmol/L 115(H) 118(H) 119(H)  CO2 22 - 32 mmol/L 23 18(L) 14(L)  Calcium 8.9 - 10.3 mg/dL 10.2 6.3(LL) 6.4(LL)  Total Protein 6.5 - 8.1 g/dL 5.5(L) - -  Total Bilirubin 0.3 - 1.2 mg/dL 1.4(H) - -  Alkaline Phos 38 - 126 U/L 126 - -  AST 15 - 41 U/L 35 - -  ALT 0 - 44 U/L 20 - -     Urine Studies No results for input(s): UHGB, CRYS in the last 72 hours.  Invalid input(s): UACOL, UAPR, USPG, UPH, UTP, UGL, UKET, UBIL, UNIT, UROB, ULEU, UEPI, UWBC, Duwayne Heck [place], [place]  Basic Metabolic Panel: Recent Labs  Lab 10/15/2018 0855 11/04/2018 0635 10/08/2018 1041  10/23/2018 2044 10/30/18 0845 10/31/18 0310 10/31/18 1049 10/31/18 1850  NA 138 143 141 141 143 148*  --   --   K 3.8 3.7 4.7 3.4* 2.8* 2.3*  --   --   CL 114* 117* 119* 119* 118* 115*  --   --   CO2 20* 17* 18* 14* 18* 23  --   --   GLUCOSE 111* 190* 106* 136* 112* 87  --   --   BUN _0 24* 25* 16  --   --   CREATININE 0.36* 0.50 0.71 1.07* 0.87 0.64  --   --   CALCIUM 7.4* 7.6* 6.5* 6.4* 6.3* 10.2  --   --   MG 2.3  --   --   --   --  1.6* 1.5* 1.4*  PHOS 2.2*  --   --   --   --   --  2.2* 2.0*   GFR Estimated Creatinine Clearance: 62.7 mL/min (by C-G formula based on SCr of 0.64 mg/dL). Liver Function Tests: Recent Labs  Lab 10/07/2018 1356 10/31/18 0310  AST 17 35  ALT 10 20  ALKPHOS 122 126  BILITOT 0.8 1.4*  PROT 4.3* 5.5*  ALBUMIN 1.9* 2.7*   No results for input(s): LIPASE, AMYLASE in the last 168 hours. No results for input(s): AMMONIA in the last 168 hours. Coagulation profile Recent Labs  Lab 10/30/18 1558  INR 1.51    CBC: Recent Labs  Lab 11/03/2018 0855 10/23/2018 0635 10/11/2018 1041  11/02/2018 2044  10/30/18 0845 10/30/18 1558 10/31/18 0310 10/31/18 1049 10/31/18 1850  WBC 26.3* 18.5* 6.4  --  2.3*  --   --  0.7* 0.6*  --   --   NEUTROABS 23.1* 14.2*  --   --  1.1*  --   --   --   --   --   --   HGB 7.9* 8.0* 8.5*   < > 6.1*   < > 7.0* 10.2* 10.5* 8.8* 8.6*  HCT 24.8* 26.7* 27.6*   < > 20.3*   < > 21.9* 31.2* 32.6* 27.4* 26.6*  MCV 92.2 98.2 95.5  --  96.7  --   --  87.9 88.3  --   --   PLT 151 205 78*  --  35*  --   --  21* 52*  --   --    < > = values in this interval not displayed.   Cardiac Enzymes: No results for input(s): CKTOTAL, CKMB, CKMBINDEX, TROPONINI in the last 168 hours. BNP: Invalid input(s): POCBNP CBG: Recent Labs  Lab 10/31/18 1919  GLUCAP 67*   D-Dimer No results for input(s): DDIMER in the last 72 hours. Hgb A1c No results for input(s): HGBA1C in the last 72 hours. Lipid Profile Recent Labs    10/06/2018 0635  10/31/18 0310  TRIG 138 126   Thyroid function studies No results for input(s): TSH, T4TOTAL, T3FREE, THYROIDAB in the last 72 hours.  Invalid input(s): FREET3 Anemia work up No results for input(s): VITAMINB12, FOLATE, FERRITIN, TIBC, IRON, RETICCTPCT in the last 72 hours. Microbiology Recent Results (from the past 240 hour(s))  MRSA PCR Screening     Status: None   Collection Time: 10/25/2018  6:19 PM  Result Value Ref Range Status   MRSA by PCR NEGATIVE NEGATIVE Final    Comment:        The GeneXpert MRSA Assay (FDA approved for NASAL specimens only), is one component of a comprehensive MRSA colonization surveillance program. It is not intended to diagnose MRSA infection nor to guide or monitor treatment for MRSA infections. Performed at [facility], [place] [address] [place], [place] [address]   Culture, respiratory (non-expectorated)     Status: None   Collection Time: 10/23/2018  9:03 AM  Result Value Ref Range Status   Specimen Description   Final    TRACHEAL ASPIRATE Performed at [place] [address] [place], [place] [address]    Special Requests   Final    NONE Performed at [facility], [place] [address], [place], [place] [address]    Gram Stain   Final    NO WBC SEEN NO SQUAMOUS EPITHELIAL CELLS SEEN MODERATE BUDDING YEAST SEEN RARE GRAM POSITIVE RODS RARE GRAM NEGATIVE RODS Performed at [facility], [address], [place], [place] [address]    Culture MODERATE CANDIDA TROPICALIS  Final   Report Status 10/31/2018 FINAL  Final      Studies:  Dg Chest 1 View  Result Date: 10/31/2018 CLINICAL DATA:  Endotracheal tube placement EXAM: CHEST  1 VIEW COMPARISON:  10/31/2018 chest radiograph  FINDINGS: Endotracheal tube tip is at the level of the clavicular heads. Orogastric tube tip and side port project over the left heart, likely within the hiatal hernia. Right internal jugular vein approach  central venous catheter tip projects over the upper SVC. Unchanged position of right chest wall AICD leads. Bibasilar atelectasis and pleural effusions. Unchanged left basilar consolidation. IMPRESSION: 1. Endotracheal tube tip at the level of the clavicular heads. 2. Orogastric tube tip and side port project over the left heart, likely within the hiatal hernia. 3. Right IJ approach CVC tip projects over the upper SVC. 4. Left basilar consolidation, bilateral pleural effusions and bibasilar atelectasis. Electronically Signed   By: Ulyses Jarred M.D.   On: 10/31/2018 05:33   Dg Abd 1 View  Result Date: 10/31/2018 CLINICAL DATA:  Advancement of OG tube EXAM: ABDOMEN - 1 VIEW COMPARISON:  Earlier same day; chest radiograph-earlier same day; CT abdomen pelvis-08/31/2018 FINDINGS: Caudal aspect of enteric tube appears coiled over lying the expected location of the known large hiatal hernia. Paucity of bowel gas without evidence of enteric obstruction. Pacer leads overlie the lower chest. Suspected small to moderate size left-sided effusion with associated left basilar opacities, atelectasis versus infiltrate. No supine evidence of pneumoperitoneum. No definite pneumatosis or portal venous gas. No definitive abnormal intra-abdominal calcifications. No acute osseus abnormalities. Degenerative change of the lower lumbar spine. IMPRESSION: 1. Enteric tube appears coiled overlying the known large hiatal hernia demonstrated on CT of the abdomen and pelvis performed 08/31/2018. 2. Small to moderate size left-sided effusion with associated left basilar opacities, atelectasis versus infiltrate. Electronically Signed   By: Sandi Mariscal M.D.   On: 10/31/2018 08:02   Dg Chest Port 1 View  Result Date: 10/31/2018 CLINICAL DATA:  Shortness of breath EXAM: PORTABLE CHEST 1 VIEW COMPARISON:  10/30/2018, 10/26/2018, 09/21/2018 FINDINGS: Right-sided pacing device as before. Small moderate bilateral pleural effusions with bibasilar  airspace disease. Cardiomegaly. Central vascular congestion. Large hiatal hernia. IMPRESSION: 1. Small moderate bilateral pleural effusions, new on the right side with worsened airspace disease at the right base and persistent dense left lung base consolidation. 2. Cardiomegaly with vascular congestion 3. Large hiatal hernia Electronically Signed   By: Donavan Foil M.D.   On: 10/31/2018 02:03   Dg Chest Port 1 View  Result Date: 10/30/2018 CLINICAL DATA:  Respiratory failure with aspiration pneumonia and hematemesis. EXAM: PORTABLE CHEST 1 VIEW COMPARISON:  11/04/2018 FINDINGS: ET tube tip is above the carina. Right IJ catheter tip is projecting over the SVC. Right chest wall pacer device is noted with leads in the right atrial appendage and right ventricle. Stable heart size. Large hiatal hernia appears unchanged. Airspace disease within the left midlung and left base is again identified and appears unchanged. Pulmonary edema is decreased in the interval. IMPRESSION: 1. Persistent left midlung and left base airspace densities. 2. Decrease in pulmonary edema. Electronically Signed   By: Kerby Moors M.D.   On: 10/30/2018 11:02   Dg Abd Portable 1v  Result Date: 10/31/2018 CLINICAL DATA:  Feeding tube placement EXAM: PORTABLE ABDOMEN - 1 VIEW COMPARISON:  Portable exam 1135 hours compared to 0741 hours Correlation: CT abdomen and pelvis 08/31/2018 FINDINGS: Tip of feeding tube projects over the expected position of the LEFT diaphragm; patient has a large hiatal hernia, feeding tube likely within stomach in the inferior chest. Pacemaker leads unchanged. Enlargement of cardiac silhouette. Atelectasis versus consolidation LEFT lower lobe. Visualized bowel gas pattern normal. IMPRESSION: Tip of feeding tube projects over  the LEFT diaphragm in a patient with a known large hiatal hernia,; suspect feeding tube is within the hiatal hernia. Electronically Signed   By: Lavonia Dana M.D.   On: 10/31/2018 12:24   Dg  Abd Portable 1v  Result Date: 10/31/2018 CLINICAL DATA:  OG tube placement EXAM: PORTABLE ABDOMEN - 1 VIEW COMPARISON:  10/26/2018; chest radiograph - earlier same day; CT abdomen and pelvis - 08/31/2018 FINDINGS: Enteric tube not identified, however overlies expected location of the large hiatal hernia on chest radiograph performed earlier same day. Paucity of bowel gas without evidence of enteric obstruction. Pacer leads overlie the lower chest. No supine evidence of pneumoperitoneum. No definite pneumatosis or portal venous gas. No definitive abnormal intra-abdominal calcifications. No acute osseus abnormalities. Degenerative change of the lower lumbar spine. IMPRESSION: Enteric tube not identified, however overlies the expected location of the known large hiatal hernia demonstrated on chest radiograph performed earlier same day. Large hiatal hernia was noted to contain near the entirety of the stomach on CT the abdomen pelvis performed 08/31/2018. Electronically Signed   By: Sandi Mariscal M.D.   On: 10/31/2018 08:00    Assessment: 73 y.o. with recently diagnosed non-Hodgkin diffuse large B-cell lymphoma, status post chemotherapy on 10/24/2018, sacral decubitus, complete heart block status post pacemaker, admitted for upper GI bleeding  1.  Upper GI bleeding secondary to multiple gastric ulcers 2.  Severe pancytopenia, secondary to chemo 3.  Diffuse large B-cell lymphoma, stage IV, status post 3 cycles of chemotherapy R-CHOP, last cycle on October 24, 2018 4.  Aspiration pneumonia with respiratory failure, intubated 5.  Sacral decubitus 6.  Complete heart block status post pacemaker   Plan:  -Appreciate ICU team care  -she has developed severe neutropenia secondary to chemo, she did receive Udenyca on 1/22, no need additional GCSF at this point -her severe thrombocytopenia is also likely secondary to chemo, I agree with platelet transfusion if needed (with plt<50K), due to her active GI  bleeding -blood transfusion if Hg<8.0  -I will get a DIC panel and iron study, ret count, LDH and haptoglobin tomorrow morning  -agree with broad antibiotics for her aspiration pneumonia, due to her severe neutropenia  -I will f/u tomorrow, and update her family    Truitt Merle, MD 10/31/2018  8:43 PM

## 2018-10-31 NOTE — Progress Notes (Signed)
[facility] Progress Note Patient Name: Patricia Murillo DOB: Dec 11, 1945 MRN: [id number]   Date of Service  10/31/2018  HPI/Events of Note  Mg++ = 1.4, PO4--- = 2.0, last K+ = 2.3 (Replaced???) and Creatinine = 0.64.   eICU Interventions  Will order: 1. Replace Mg++ and PO4--- now.  2. BMP STAT.      Intervention Category Major Interventions: Electrolyte abnormality - evaluation and management  Lysle Dingwall 10/31/2018, 9:37 PM

## 2018-10-31 NOTE — Progress Notes (Signed)
Initial Nutrition Assessment  DOCUMENTATION CODES:   Severe malnutrition in context of chronic illness  INTERVENTION:   Monitor magnesium, potassium, and phosphorus daily for at least 3 days, MD to replete as needed, as pt is at risk for refeeding syndrome given severe malnutrition, low K/Phos/MG levels and poor PO intake for at least a month.   -Initiate Vital AF 1.2 @ 20 ml/hr, advance by 10 ml every 12 hours to goal rate of 50 ml/hr.  -30 ml Prostat daily -Recommend free water of 200 ml every 6 hours (800 ml total). -At goal rate this will provide 1540 kcal, 105g protein and 1773 ml H2O.  NUTRITION DIAGNOSIS:   Severe Malnutrition related to chronic illness, cancer and cancer related treatments as evidenced by percent weight loss, energy intake < or equal to 75% for > or equal to 1 month, moderate fat depletion, severe muscle depletion.  GOAL:   Patient will meet greater than or equal to 90% of their needs  MONITOR:   Labs, Weight trends, Vent status, TF tolerance, Skin, I & O's  REASON FOR ASSESSMENT:   Consult Enteral/tube feeding initiation and management  ASSESSMENT:   73 year old with past medical history relevant for hypertension, pain, non-Hodgkin's lymphoma status post chemotherapy 10/24/2018 with R-CHOP, status post permanent pacemaker, sacral decubitus ulcer admitted from assisted living facility on 10/26/2018 with reports of hematemesis status post EGD showing cratered gastric ulcers.  Patient is currently intubated on ventilator support MV: 7.7 L/min Temp (24hrs), Avg:97.3 F (36.3 C), Min:96.3 F (35.7 C), Max:98.2 F (36.8 C)  Propofol: 14.6 ml/hr -provides 385 kcal  Patient in room, sedated on vent with multiple family members at bedside. Pt's family states the pt was not eating well at the nursing home she discharged to after her recent admission 12/4-1/1. During that admission pt was eating 50-100% of her meals with some supplements drank daily.  Per  family, pt tried to drink Ensure supplements but grew tired of them. Pt did not like the food at the facility and the family tried to provide meals from the outside for her but they were unable to provide every meal for the patient. Pt has had poor PO intakes for almost 4 weeks now.   TF recommendations are above. Per RN, pt waiting to have tube placement confirmed prior to initiation of feeds.  Per family, pt weighed 133 lb at last chemo appointment. For this admission, pt is recorded as weighing 153 lb today. Admission weight on 1/24 was 147 lb. Pt has lost 51 lb since 08/30/18 (25% wt loss x 2 months, significant for time frame).  Medications:  Labs reviewed: Elevated Na Low K, Phos, Mg  NUTRITION - FOCUSED PHYSICAL EXAM:    Most Recent Value  Orbital Region  Mild depletion  Upper Arm Region  Moderate depletion  Thoracic and Lumbar Region  Unable to assess  Buccal Region  Mild depletion  Temple Region  Moderate depletion  Clavicle Bone Region  Severe depletion  Clavicle and Acromion Bone Region  Severe depletion  Scapular Bone Region  Unable to assess  Dorsal Hand  Unable to assess  Patellar Region  Unable to assess  Anterior Thigh Region  Unable to assess  Posterior Calf Region  Unable to assess  Edema (RD Assessment)  Moderate       Diet Order:   Diet Order            Diet NPO time specified  Diet effective now  EDUCATION NEEDS:   Education needs have been addressed  Skin:  Skin Assessment: Skin Integrity Issues: Skin Integrity Issues:: Stage III Stage III: sacrum  Last BM:  1/27  Height:   Ht Readings from Last 1 Encounters:  10/31/18 5\' 7"  (1.702 m)    Weight:   Wt Readings from Last 1 Encounters:  10/31/18 69.7 kg    Ideal Body Weight:  61.4 kg  BMI:  Body mass index is 24.07 kg/m.  Estimated Nutritional Needs:   Kcal:  5284  Protein:  100-110g  Fluid:  1.5L/day  Clayton Bibles, MS, RD, LDN [place]  Dietitian Pager: [phone] After Hours Pager: [phone]

## 2018-10-31 NOTE — Progress Notes (Signed)
Hypoglycemic Event  CBG: 67  Treatment: 12.5g 50% dextrose  Symptoms: n/a  Follow-up CBG: Time:2020 CBG Result:95  Possible Reasons for Event: no nutrition   Comments/MD notified: Baltazar Najjar, NP notified     Whitney Muse

## 2018-10-31 NOTE — Progress Notes (Signed)
[facility] PROGRESS NOTE Subjective Unable to place feeding tube due to hiatal hernia x2 according to nursing staff.  Patient intubated and sedated.  Objective: Vital signs in last 24 hours: Temp:  [96.3 F (35.7 C)-98.2 F (36.8 C)] 97.6 F (36.4 C) (01/27 0800) Pulse Rate:  [67-110] 83 (01/27 1300) Resp:  [16-30] 21 (01/27 1300) BP: (121-183)/(56-104) 128/58 (01/27 1300) SpO2:  [92 %-100 %] 100 % (01/27 1300) Arterial Line BP: (95-128)/(57-65) 99/62 (01/26 1505) FiO2 (%):  [40 %] 40 % (01/27 1132) Weight:  [69.7 kg] 69.7 kg (01/27 0500) Last BM Date: 10/31/18  Intake/Output from previous day: 01/26 0701 - 01/27 0700 In: 4780.2 [I.V.:3183.3; Blood:1186.7; IV Piggyback:410.3] Out: 9528 [Urine:7210] Intake/Output this shift: Total I/O In: 477.5 [I.V.:75.2; IV Piggyback:402.3] Out: -   PE: General--intubated nonresponsive  Abdomen--nontender  Lab Results: Recent Labs    10/11/2018 0635 10/10/2018 1041  10/09/2018 2044 10/30/18 0116 10/30/18 0845 10/30/18 1558 10/31/18 0310 10/31/18 1049  WBC 18.5* 6.4  --  2.3*  --   --  0.7* 0.6*  --   HGB 8.0* 8.5*   < > 6.1* 7.0* 7.0* 10.2* 10.5* 8.8*  HCT 26.7* 27.6*   < > 20.3* 21.9* 21.9* 31.2* 32.6* 27.4*  PLT 205 78*  --  35*  --   --  21* 52*  --    < > = values in this interval not displayed.   BMET Recent Labs    10/16/2018 0635 10/24/2018 1041 10/30/2018 2044 10/30/18 0845 10/31/18 0310  NA 143 141 141 143 148*  K 3.7 4.7 3.4* 2.8* 2.3*  CL 117* 119* 119* 118* 115*  CO2 17* 18* 14* 18* 23  CREATININE 0.50 0.71 1.07* 0.87 0.64   LFT Recent Labs    10/31/18 0310  PROT 5.5*  AST 35  ALT 20  ALKPHOS 126  BILITOT 1.4*   PT/INR Recent Labs    10/30/18 1558  LABPROT 18.1*  INR 1.51   PANCREAS No results for input(s): LIPASE in the last 72 hours.       Studies/Results: Dg Chest 1 View  Result Date: 10/31/2018 CLINICAL DATA:  Endotracheal tube placement EXAM: CHEST  1 VIEW COMPARISON:   10/31/2018 chest radiograph FINDINGS: Endotracheal tube tip is at the level of the clavicular heads. Orogastric tube tip and side port project over the left heart, likely within the hiatal hernia. Right internal jugular vein approach central venous catheter tip projects over the upper SVC. Unchanged position of right chest wall AICD leads. Bibasilar atelectasis and pleural effusions. Unchanged left basilar consolidation. IMPRESSION: 1. Endotracheal tube tip at the level of the clavicular heads. 2. Orogastric tube tip and side port project over the left heart, likely within the hiatal hernia. 3. Right IJ approach CVC tip projects over the upper SVC. 4. Left basilar consolidation, bilateral pleural effusions and bibasilar atelectasis. Electronically Signed   By: Ulyses Jarred M.D.   On: 10/31/2018 05:33   Dg Abd 1 View  Result Date: 10/31/2018 CLINICAL DATA:  Advancement of OG tube EXAM: ABDOMEN - 1 VIEW COMPARISON:  Earlier same day; chest radiograph-earlier same day; CT abdomen pelvis-08/31/2018 FINDINGS: Caudal aspect of enteric tube appears coiled over lying the expected location of the known large hiatal hernia. Paucity of bowel gas without evidence of enteric obstruction. Pacer leads overlie the lower chest. Suspected small to moderate size left-sided effusion with associated left basilar opacities, atelectasis versus infiltrate. No supine evidence of pneumoperitoneum. No definite pneumatosis or portal venous gas. No  definitive abnormal intra-abdominal calcifications. No acute osseus abnormalities. Degenerative change of the lower lumbar spine. IMPRESSION: 1. Enteric tube appears coiled overlying the known large hiatal hernia demonstrated on CT of the abdomen and pelvis performed 08/31/2018. 2. Small to moderate size left-sided effusion with associated left basilar opacities, atelectasis versus infiltrate. Electronically Signed   By: Sandi Mariscal M.D.   On: 10/31/2018 08:02   Dg Chest Port 1 View  Result  Date: 10/31/2018 CLINICAL DATA:  Shortness of breath EXAM: PORTABLE CHEST 1 VIEW COMPARISON:  10/30/2018, 10/13/2018, 09/21/2018 FINDINGS: Right-sided pacing device as before. Small moderate bilateral pleural effusions with bibasilar airspace disease. Cardiomegaly. Central vascular congestion. Large hiatal hernia. IMPRESSION: 1. Small moderate bilateral pleural effusions, new on the right side with worsened airspace disease at the right base and persistent dense left lung base consolidation. 2. Cardiomegaly with vascular congestion 3. Large hiatal hernia Electronically Signed   By: Donavan Foil M.D.   On: 10/31/2018 02:03   Dg Chest Port 1 View  Result Date: 10/30/2018 CLINICAL DATA:  Respiratory failure with aspiration pneumonia and hematemesis. EXAM: PORTABLE CHEST 1 VIEW COMPARISON:  11/03/2018 FINDINGS: ET tube tip is above the carina. Right IJ catheter tip is projecting over the SVC. Right chest wall pacer device is noted with leads in the right atrial appendage and right ventricle. Stable heart size. Large hiatal hernia appears unchanged. Airspace disease within the left midlung and left base is again identified and appears unchanged. Pulmonary edema is decreased in the interval. IMPRESSION: 1. Persistent left midlung and left base airspace densities. 2. Decrease in pulmonary edema. Electronically Signed   By: Kerby Moors M.D.   On: 10/30/2018 11:02   Dg Abd Portable 1v  Result Date: 10/31/2018 CLINICAL DATA:  Feeding tube placement EXAM: PORTABLE ABDOMEN - 1 VIEW COMPARISON:  Portable exam 1135 hours compared to 0741 hours Correlation: CT abdomen and pelvis 08/31/2018 FINDINGS: Tip of feeding tube projects over the expected position of the LEFT diaphragm; patient has a large hiatal hernia, feeding tube likely within stomach in the inferior chest. Pacemaker leads unchanged. Enlargement of cardiac silhouette. Atelectasis versus consolidation LEFT lower lobe. Visualized bowel gas pattern normal.  IMPRESSION: Tip of feeding tube projects over the LEFT diaphragm in a patient with a known large hiatal hernia,; suspect feeding tube is within the hiatal hernia. Electronically Signed   By: Lavonia Dana M.D.   On: 10/31/2018 12:24   Dg Abd Portable 1v  Result Date: 10/31/2018 CLINICAL DATA:  OG tube placement EXAM: PORTABLE ABDOMEN - 1 VIEW COMPARISON:  10/13/2018; chest radiograph - earlier same day; CT abdomen and pelvis - 08/31/2018 FINDINGS: Enteric tube not identified, however overlies expected location of the large hiatal hernia on chest radiograph performed earlier same day. Paucity of bowel gas without evidence of enteric obstruction. Pacer leads overlie the lower chest. No supine evidence of pneumoperitoneum. No definite pneumatosis or portal venous gas. No definitive abnormal intra-abdominal calcifications. No acute osseus abnormalities. Degenerative change of the lower lumbar spine. IMPRESSION: Enteric tube not identified, however overlies the expected location of the known large hiatal hernia demonstrated on chest radiograph performed earlier same day. Large hiatal hernia was noted to contain near the entirety of the stomach on CT the abdomen pelvis performed 08/31/2018. Electronically Signed   By: Sandi Mariscal M.D.   On: 10/31/2018 08:00    Medications: I have reviewed the patient's current medications.  Assessment:   1.  Upper GI bleed.  Multiple gastric ulcers are quite  deep and bleeding is probably exacerbated by low platelet count due to chemotherapy.  Long discussion with family.  Would probably hold off on feeding tube now since placement even utilizing radiology could perforated ulcer.  Have discussed this with critical care and with family. 2.  Thrombocytopenia secondary to chemotherapy 3.  Lymphoma 4.  Respiratory failure currently intubated 5.  Pneumonia on antibiotics   Plan: At this point I would hold off with placement of feeding tube for several more days until more  stable.  Hopefully her bleeding will improve as her platelet count comes back up.  When her platelet count is back up to normal that might be a reasonable time to undergo a radiographically placed feeding tube.  Hopefully patient could be extubated prior to then.  In the interim would continue on aggressive PPI therapy.   Nancy Fetter 10/31/2018, 1:48 PM  This note was created using voice recognition software. Minor errors may Have occurred unintentionally.  Pager: [phone] If no answer or after hours call [phone]

## 2018-10-31 NOTE — Progress Notes (Signed)
NAME:  Patricia Murillo, MRN:  [id number], DOB:  [birth year], LOS: 4 ADMISSION DATE:  10/14/2018, CONSULTATION DATE:  REFERRING MD:  Dr Wilson Singer, CHIEF COMPLAINT: Hematemesis  Brief History   Patient was brought into the hospital today for hematemesis this afternoon Described about 100 cc of bright red blood, brought in from a nursing home She did bring up a little amount about an hour ago, complaining of nausea She did decompensate overnight 10/28/2018 Nausea with vomiting of coffee grounds Aspirated and further decompensated,Intubated and on ventilator at present Patient rallied significantly overnight Off pressors, awake and interactive  History of present illness   Patient with a history of lymphoma, status post chemotherapy about 2 days ago Extensive disease from recent CT chest from 09/07/2089 She was recently hospitalized for severe anemia, left arm swelling Started [facility] and discharged to skilled nursing facility on 10/05/2018 She was doing well up until day of presentation when she had the episode of hematemesis She was not feeling acutely sick, did have associated nausea  Post EGD-revealing ulcers-nonbleeding Had coffee-ground emesis during the night Aspirated and went into respiratory failure requiring intubation Hypoxemic respiratory failure-was hemodynamically stable during the process but became hypotensive afterwards Awake and interactive  Past Medical History   Past Medical History:  Diagnosis Date  . Arthritis   . Hiatal hernia   . History of chicken pox   . Hyperlipidemia   . Non Hodgkin's lymphoma ([place])   . Pacemaker    Significant Hospital Events   Has remained edema dynamically stable 1/24-complaining of nausea vomiting, vomited a few times and aspirated, progressed to respiratory failure, required intubation.  Hemodynamic compromise following initial stabilization Significant amount of coffee grounds noted in endotracheal tube following  intubation 1/25-weaned off pressors.  Awake, interactive, moving all extremities  Consults:  GI-post endoscopy significant for multiple ulcers-nonbleeding Repeat endoscopy on 10/21/2018-showed gastric edema, hemospray.  Large amounts of altered blood noted in the stomach  Procedures:  10/21/2018 right IJ placed 10/08/2018 endotracheal tube placed  Significant Diagnostic Tests:  Last CT chest was 09/07/2018 showing progression of lymphoma, multiple masses and extensive adenopathy CT abdomen 08/31/2018 also did reveal extensive adenopathy Chest x-ray on 11/01/2018-no new infiltrative process, large hiatal hernia, tube appropriately positioned Chest x-ray post line placement noted-no pneumothorax, fluffy infiltrate on left  Micro Data:  Respiratory 1/25 >> Candida tropicalis  Antimicrobials:  Zosyn 11/03/2018>> Fluconazole 1/27 >>   Interim history/subjective:  She was extubated on 1/26 but unfortunately experienced progressive difficulty managing secretions, progressive atelectasis and failed, required reintubation early morning 1/27  Objective   Blood pressure (!) 124/56, pulse 84, temperature 97.6 F (36.4 C), temperature source Axillary, resp. rate 16, height 5\' 7"  (1.702 m), weight 69.7 kg, SpO2 98 %. CVP:  [7 mmHg-9 mmHg] 9 mmHg  Vent Mode: PRVC FiO2 (%):  [40 %] 40 % Set Rate:  [16 bmp] 16 bmp Vt Set:  [490 mL] 490 mL PEEP:  [5 cmH20-6 cmH20] 5 cmH20 Pressure Support:  [6 cmH20] 6 cmH20 Plateau Pressure:  [16 cmH20-18 cmH20] 17 cmH20   Intake/Output Summary (Last 24 hours) at 10/31/2018 1550 Last data filed at 10/31/2018 1421 Gross per 24 hour  Intake 3744.92 ml  Output 5360 ml  Net -1615.08 ml   Filed Weights   11/02/2018 0201 10/30/18 0258 10/31/18 0500  Weight: 68.5 kg 74.3 kg 69.7 kg    Examination: General: Ill-appearing woman, no distress, mechanically ventilated and sedated HENT: ET tube in place, oropharynx moist Lungs: Bilateral rhonchi without  any  wheezing Cardiovascular: Regular, no murmur Abdomen: Soft, obese, nontender with positive bowel sounds Extremities: Bilateral upper and lower extremity edema Neuro: Sedated, some grimace with stimulation, did not follow commands  Resolved Hospital Problem list   Hypocalcemia   Assessment & Plan:  Acute hypoxemic respiratory failure, presumed due to aspiration event and then overall weakness requiring reintubation 1/27 Continue current mechanical ventilation, wean FiO2 and PEEP as able VAP prevention orders Bilateral basilar infiltrates with small effusions, consider possible cardiogenic edema.  Check TTE 1/27 Antibiotics as below  Pneumonia, possible aspiration.  Note respiratory culture with Candida tropicalis Continue Zosyn Unclear that Candida is a pathogenic organism, likely contaminant, but in her immunosuppressed state I think that has to be covered.  Start fluconazole 1/27, C tropicalis typically sensitive to azoles.   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 Any role for G-CSF here. 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 Pain/Anxiety/Delirium protocol (if indicated):  VAP protocol (if indicated):  DVT prophylaxis: SCD, GI bleed GI prophylaxis: On Protonix Glucose control: ssi Mobility: Bedrest Code Status: Full code Family Communication: discussed with daughters at bedside 1/27 Disposition: ICU   Labs   CBC: Recent Labs  Lab 11/04/2018 0855 10/07/2018 0635 10/09/2018 1041  10/17/2018 2044 10/30/18 0116 10/30/18 0845  10/30/18 1558 10/31/18 0310 10/31/18 1049  WBC 26.3* 18.5* 6.4  --  2.3*  --   --  0.7* 0.6*  --   NEUTROABS 23.1* 14.2*  --   --  1.1*  --   --   --   --   --   HGB 7.9* 8.0* 8.5*   < > 6.1* 7.0* 7.0* 10.2* 10.5* 8.8*  HCT 24.8* 26.7* 27.6*   < > 20.3* 21.9* 21.9* 31.2* 32.6* 27.4*  MCV 92.2 98.2 95.5  --  96.7  --   --  87.9 88.3  --   PLT 151 205 78*  --  35*  --   --  21* 52*  --    < > = values in this interval not displayed.    Basic Metabolic Panel: Recent Labs  Lab 10/05/2018 0855 10/08/2018 0635 10/12/2018 1041 10/28/2018 2044 10/30/18 0845 10/31/18 0310 10/31/18 1049  NA 138 143 141 141 143 148*  --   K 3.8 3.7 4.7 3.4* 2.8* 2.3*  --   CL 114* 117* 119* 119* 118* 115*  --   CO2 20* 17* 18* 14* 18* 23  --   GLUCOSE 111* 190* 106* 136* 112* 87  --   BUN 20 13 19  24* 25* 16  --   CREATININE 0.36* 0.50 0.71 1.07* 0.87 0.64  --   CALCIUM 7.4* 7.6* 6.5* 6.4* 6.3* 10.2  --   MG 2.3  --   --   --   --  1.6* 1.5*  PHOS 2.2*  --   --   --   --   --  2.2*   GFR: Estimated Creatinine Clearance: 62.7 mL/min (by C-G formula based on SCr of 0.64 mg/dL). Recent Labs  Lab 10/12/2018 1355  10/28/2018 1041 10/28/2018  1731 10/13/2018 2044 10/30/18 1558 10/31/18 0310  WBC  --    < > 6.4  --  2.3* 0.7* 0.6*  LATICACIDVEN 3.5*  --   --  1.5  --   --   --    < > = values in this interval not displayed.    Liver Function Tests: Recent Labs  Lab 10/11/2018 1356 10/31/18 0310  AST 17 35  ALT 10 20  ALKPHOS 122 126  BILITOT 0.8 1.4*  PROT 4.3* 5.5*  ALBUMIN 1.9* 2.7*   No results for input(s): LIPASE, AMYLASE in the last 168 hours. No results for input(s): AMMONIA in the last 168 hours.  ABG    Component Value Date/Time   PHART 7.418 10/31/2018 0455   PCO2ART 37.3 10/31/2018 0455   PO2ART 89.3 10/31/2018 0455   HCO3 23.7 10/31/2018 0455   TCO2 27 04/18/2018 0838   ACIDBASEDEF 0.2 10/31/2018 0455   O2SAT 97.4 10/31/2018 0455     Coagulation Profile: Recent Labs  Lab  10/30/18 1558  INR 1.51    Independent CC time 34 minutes  Baltazar Apo, MD, PhD 10/31/2018, 4:08 PM [facility] [phone] or if no answer [phone]

## 2018-10-31 NOTE — Progress Notes (Signed)
eLink Physician-Brief Progress Note Patient Name: Patricia Murillo DOB: Nov 05, 1945 MRN: [id number]   Date of Service  10/31/2018  HPI/Events of Note  SOB/Hypoxemia - Nurse notes wheezing and crackles at bases. CVP = 8.  Sat = 96% and RR = 24 on [place] O2.   eICU Interventions  Will order: 1. Albuterol 2.5 mg via neb Q 3 hours PRN wheezing or SOB. 2. Portable CXR STAT.      Intervention Category Major Interventions: Hypoxemia - evaluation and management  [name],[name] Eugene 10/31/2018, 1:10 AM

## 2018-10-31 NOTE — Progress Notes (Signed)
CRITICAL VALUE ALERT  Critical Value:  Potassium 2.2  Date & Time Notied:  2318 10/31/18  Provider Notified: Oletta Darter  Orders Received/Actions taken: awaiting orders

## 2018-11-01 ENCOUNTER — Inpatient Hospital Stay (HOSPITAL_COMMUNITY): Payer: Medicare HMO

## 2018-11-01 DIAGNOSIS — K922 Gastrointestinal hemorrhage, unspecified: Secondary | ICD-10-CM

## 2018-11-01 LAB — CBC
HCT: 27.5 % — ABNORMAL LOW (ref 36.0–46.0)
Hemoglobin: 8.8 g/dL — ABNORMAL LOW (ref 12.0–15.0)
MCH: 28.8 pg (ref 26.0–34.0)
MCHC: 32 g/dL (ref 30.0–36.0)
MCV: 89.9 fL (ref 80.0–100.0)
NRBC: 0 % (ref 0.0–0.2)
Platelets: 48 10*3/uL — ABNORMAL LOW (ref 150–400)
RBC: 3.06 MIL/uL — ABNORMAL LOW (ref 3.87–5.11)
RDW: 16.9 % — ABNORMAL HIGH (ref 11.5–15.5)
WBC: 0.2 10*3/uL — CL (ref 4.0–10.5)

## 2018-11-01 LAB — DIC (DISSEMINATED INTRAVASCULAR COAGULATION)PANEL
Fibrinogen: 528 mg/dL — ABNORMAL HIGH (ref 210–475)
INR: 1.38
Platelets: 46 10*3/uL — ABNORMAL LOW (ref 150–400)
Prothrombin Time: 16.8 seconds — ABNORMAL HIGH (ref 11.4–15.2)
Smear Review: NONE SEEN
aPTT: 26 seconds (ref 24–36)

## 2018-11-01 LAB — BASIC METABOLIC PANEL
Anion gap: 8 (ref 5–15)
BUN: 10 mg/dL (ref 8–23)
CO2: 26 mmol/L (ref 22–32)
CREATININE: 0.48 mg/dL (ref 0.44–1.00)
Calcium: 8.4 mg/dL — ABNORMAL LOW (ref 8.9–10.3)
Chloride: 115 mmol/L — ABNORMAL HIGH (ref 98–111)
GFR calc non Af Amer: >60 mL/min (ref >60)
Glucose, Bld: 89 mg/dL (ref 70–99)
Potassium: 2.5 mmol/L — CL (ref 3.5–5.1)
Sodium: 149 mmol/L — ABNORMAL HIGH (ref 135–145)

## 2018-11-01 LAB — MAGNESIUM
Magnesium: 2 mg/dL (ref 1.7–2.4)
Magnesium: 1.5 mg/dL — ABNORMAL LOW (ref 1.7–2.4)

## 2018-11-01 LAB — COMPREHENSIVE METABOLIC PANEL
ALT: 14 U/L (ref 0–44)
AST: 19 U/L (ref 15–41)
Albumin: 2.2 g/dL — ABNORMAL LOW (ref 3.5–5.0)
Alkaline Phosphatase: 83 U/L (ref 38–126)
Anion gap: 7 (ref 5–15)
BUN: 11 mg/dL (ref 8–23)
CO2: 25 mmol/L (ref 22–32)
Calcium: 8.9 mg/dL (ref 8.9–10.3)
Chloride: 115 mmol/L — ABNORMAL HIGH (ref 98–111)
Creatinine, Ser: 0.42 mg/dL — ABNORMAL LOW (ref 0.44–1.00)
GFR calc Af Amer: >60 mL/min (ref >60)
GFR calc non Af Amer: >60 mL/min (ref >60)
Glucose, Bld: 87 mg/dL (ref 70–99)
Potassium: 2.9 mmol/L — ABNORMAL LOW (ref 3.5–5.1)
SODIUM: 147 mmol/L — AB (ref 135–145)
Total Bilirubin: 1.5 mg/dL — ABNORMAL HIGH (ref 0.3–1.2)
Total Protein: 4.6 g/dL — ABNORMAL LOW (ref 6.5–8.1)

## 2018-11-01 LAB — FERRITIN: FERRITIN: 2066 ng/mL — AB (ref 11–307)

## 2018-11-01 LAB — IRON AND TIBC
Iron: 13 ug/dL — ABNORMAL LOW (ref 28–170)
Saturation Ratios: 9 % — ABNORMAL LOW (ref 10.4–31.8)
TIBC: 138 ug/dL — ABNORMAL LOW (ref 250–450)
UIBC: 125 ug/dL

## 2018-11-01 LAB — HEMOGLOBIN AND HEMATOCRIT, BLOOD
HCT: 25 % — ABNORMAL LOW (ref 36.0–46.0)
HCT: 26.4 % — ABNORMAL LOW (ref 36.0–46.0)
HCT: 27.5 % — ABNORMAL LOW (ref 36.0–46.0)
Hemoglobin: 8.1 g/dL — ABNORMAL LOW (ref 12.0–15.0)
Hemoglobin: 8.4 g/dL — ABNORMAL LOW (ref 12.0–15.0)
Hemoglobin: 8.7 g/dL — ABNORMAL LOW (ref 12.0–15.0)

## 2018-11-01 LAB — RETICULOCYTES
IMMATURE RETIC FRACT: 19.6 % — AB (ref 2.3–15.9)
RBC.: 3.06 MIL/uL — ABNORMAL LOW (ref 3.87–5.11)
Retic Count, Absolute: 7.5 10*3/uL — ABNORMAL LOW (ref 19.0–186.0)
Retic Ct Pct: 0.3 % — ABNORMAL LOW (ref 0.4–3.1)

## 2018-11-01 LAB — GLUCOSE, CAPILLARY
Glucose-Capillary: 73 mg/dL (ref 70–99)
Glucose-Capillary: 74 mg/dL (ref 70–99)
Glucose-Capillary: 75 mg/dL (ref 70–99)
Glucose-Capillary: 79 mg/dL (ref 70–99)
Glucose-Capillary: 81 mg/dL (ref 70–99)
Glucose-Capillary: 85 mg/dL (ref 70–99)
Glucose-Capillary: 98 mg/dL (ref 70–99)

## 2018-11-01 LAB — PHOSPHORUS
Phosphorus: 2.6 mg/dL (ref 2.5–4.6)
Phosphorus: 2.3 mg/dL — ABNORMAL LOW (ref 2.5–4.6)

## 2018-11-01 LAB — TRIGLYCERIDES: Triglycerides: 182 mg/dL — ABNORMAL HIGH (ref <150)

## 2018-11-01 LAB — LACTATE DEHYDROGENASE: LDH: 174 U/L (ref 98–192)

## 2018-11-01 LAB — DIC (DISSEMINATED INTRAVASCULAR COAGULATION) PANEL: D DIMER QUANT: 4.75 ug{FEU}/mL — AB (ref 0.00–0.50)

## 2018-11-01 MED ORDER — DEXTROSE 10 % IV SOLN
INTRAVENOUS | Status: DC
  Administered 2018-11-01: 05:00:00 via INTRAVENOUS

## 2018-11-01 MED ORDER — KCL IN DEXTROSE-NACL 20-5-0.45 MEQ/L-%-% IV SOLN
INTRAVENOUS | Status: DC
  Administered 2018-11-01: 19:00:00 via INTRAVENOUS
  Filled 2018-11-01: qty 1000

## 2018-11-01 MED ORDER — POTASSIUM CHLORIDE 10 MEQ/50ML IV SOLN
10.0000 meq | INTRAVENOUS | Status: AC
  Administered 2018-11-01 (×6): 10 meq via INTRAVENOUS
  Filled 2018-11-01 (×6): qty 50

## 2018-11-01 MED ORDER — FUROSEMIDE 10 MG/ML IJ SOLN
20.0000 mg | Freq: Two times a day (BID) | INTRAMUSCULAR | Status: AC
  Administered 2018-11-01 (×2): 20 mg via INTRAVENOUS
  Filled 2018-11-01 (×2): qty 2

## 2018-11-01 MED ORDER — POTASSIUM CHLORIDE 2 MEQ/ML IV SOLN: INTRAVENOUS | Status: DC

## 2018-11-01 NOTE — Progress Notes (Signed)
Hypoglycemic Event  CBG: 65  Treatment: 12.5g of 50% dextrose  Symptoms:n/a  Follow-up CBG: Time:0000 CBG Result:98  Possible Reasons for Event: no nutrition   Comments/MD notified:will notify MD    Whitney Muse

## 2018-11-01 NOTE — Progress Notes (Addendum)
PROGRESS NOTE    Patricia Murillo  JOI:[id number] DOB: 08/22/1946 DOA: 10/26/2018 PCP: Patient, No Pcp Per   Brief Narrative:  73 year old with past medical history relevant for hypertension, pain, non-Hodgkin's lymphoma status post chemotherapy 10/24/2018 with R-CHOP, status post permanent pacemaker, sacral decubitus ulcer admitted from assisted living facility on 10/31/2018 with reports of hematemesis status post EGD showing cratered gastric ulcers.  Patient decompensated on 10/23/2018 and was intubated for acute hypoxemic respiratory failure due to aspiration pneumonia as well as hypotension due to persistent GI bleeding status post repeat EGD on 10/12/2018 showing only large blood clots and ischemic changes.   Assessment & Plan:   Active Problems:   Arthritis involving multiple sites   Complete heart block (HCC)   Lymphoma (HCC)   GI bleed   Acute hypoxemic respiratory failure (HCC)   Protein-calorie malnutrition, severe   Acute blood loss anemia  #) Acute hypoxemic respiratory failure/aspiration pneumonia: Intubated 11/01/2018 to 10/30/2018 secondary to aspiration from coffee-ground emesis.  Then intubated again on 10/31/2018 likely due to transfusion associated circulatory overload. -Continue IV Zosyn for likely aspiration pneumonia pneumonitis started 10/08/2018 -Continue IV fentanyl and midazolam for sedation -Echo shows EF of 40 to 45% with mild wall motion abnormalities -BNP minimally elevated -Significant diuresis limited by developing hypernatremia -Continue SBT and SAT but will keep patient intubated  #) Hypernatremia: Likely in the setting of significant diuresis -Hold diuresis at this time  #) Hypotension: Resolved with resuscitation.  Repeat EGD showed only evidence of leaking  #) Acute on chronic blood loss anemia: Resolved with resuscitation -EGD on 10/06/2018 shows only cratered gastric ulcers, biopsies showed no evidence of lymphoma Stat EGD on 11/01/2018 showed large  amounts of blood clots in the stomach as well as ischemic changes and large deeply cratered ulcers -Continue PPI per GI recommendation -Continue iron supplementation  #) Thrombocytopenia/leukopenia: In the setting of significant amounts of packed red blood cells as well as recent chemotherapy -Status post 2 units of platelets and product  #) Decubitus ulcer: This apparently has been slow to heal which is not tremendously surprising considering the profound amount of immunosuppression she is on. -Wound care consult  #) Non-Hodgkin's lymphoma status post chemotherapy on 10/24/2018: Patient's tremendous white blood cell count is likely secondary to Neulasta. -Daily CBC with differential -Continue acyclovir prophylaxis -Continue ciprofloxacin prophylaxis -Continue Marinol 300 mg daily  #) Hypertension: -Discontinue/hold amlodipine 5 mg daily  #) Pain/psych: -Continue fentanyl patch 12 mcg every 3 days  #) Complete heart block status post permanent pacemaker: Last pacemaker interrogation showed no issues  Fluids: Blood administration Electrolytes: Monitor and supplement Nutrition: N.p.o.  Prophylaxis: SCDs  Disposition: Pending resolution of hypoxia/extubation and stability of hemoglobin  Full code    Consultants:   GI, [facility]  Procedures:  EGD 10/20/2018:The examined esophagus was normal. Findings: Five cratered gastric ulcers were found in the gastric fundus and in the gastric body. The largest lesion was 15 mm in largest dimension. Biopsies were taken with a cold forceps for histology. No active bleeding or visible vessels. The examined duodenum was normal. - Normal esophagus. - Gastric ulcers. Biopsied. - Normal examined duodenum. Impression: Moderate Sedation: - Clear liquid diet.  - Continue present medications. PPI   EGD 10/13/2018:Findings: Hematin (altered blood/coffee-ground-like material) was found in the gastric body. Diffuse severely friable mucosa with  contact bleeding was found in the gastric body. Four non-obstructing oozing cratered gastric ulcers with pigmented material were found in the gastric body. The largest lesion was 15  mm in largest dimension. For hemostasis, hemostatic spray was deployed. Multiple sprays were applied. There was no bleeding at the end of the procedure. There appears to be ischemic changes in some part of gastric mucosa. Multiple large deeply cratered ulcers were seen in the proximal body of the stomach with evidence of recent bleeding. Large amount of blood clots in the stomach were suctioned using power suction. Subsequently hemo-spray was applied. Hematin (altered blood/coffee-ground-like material) was found in the entire duodenum. - No gross lesions in esophagus. - Hematin (altered blood/coffee-ground-like material) in the gastric body. - Friable gastric mucosa. - Non-obstructing oozing gastric ulcers with pigmented material. Hemostatic spray applied. - Blood in the entire examined duodenum. - No specimens collected. . Intubated 10/23/2018 to 10/30/2018 Intubated 10/31/2020 ongoing  Echo 10/31/2018:- Left ventricle: The cavity size was normal. Wall thickness was   normal. Systolic function was mildly to moderately reduced. The   estimated ejection fraction was in the range of 40% to 45%. There   is hypokinesis of the anteroseptal myocardium. Doppler parameters   are consistent with abnormal left ventricular relaxation (grade 1   diastolic dysfunction). - Aortic valve: Trileaflet; mildly thickened, mildly calcified   leaflets. - Right ventricle: Pacer wire or catheter noted in right ventricle. - Tricuspid valve: There was moderate regurgitation. - Pulmonary arteries: Systolic pressure was mildly increased. PA   peak pressure: 40 mm Hg (S).   Antimicrobials:   Ciprofloxacin started 10/26/2018 to 10/21/2018  IV Zosyn started 10/05/2018   Subjective: Patient had relatively uneventful night last night.   Patient's hemoglobin stabilized.  X-ray this morning is unremarkable.  Patient is on fairly minimal ventilator settings and is awake and alert underneath the ventilator.  Objective: Vitals:   11/01/18 0438 11/01/18 0600 11/01/18 0748 11/01/18 0800  BP:  (!) 123/59  (!) 118/59  Pulse:  92  91  Resp:  20  18  Temp:      TempSrc:      SpO2:  97% 98% 98%  Weight: 69.7 kg     Height:        Intake/Output Summary (Last 24 hours) at 11/01/2018 0844 Last data filed at 11/01/2018 0827 Gross per 24 hour  Intake 2693.24 ml  Output 1950 ml  Net 743.24 ml   Filed Weights   10/30/18 0258 10/31/18 0500 11/01/18 0438  Weight: 74.3 kg 69.7 kg 69.7 kg    Examination:  General exam: No acute distress, intubated but not sedated, responsive to commands Respiratory system: Coarse sounds bilaterally worse on left, no increased work of breathing and breathing over ventilator, mechanically ventilated Cardiovascular system: Regular rate and rhythm, not tachycardic Gastrointestinal system: Soft, nondistended, no rebound or guarding, plus bowel sounds.,  NG tube in place Central nervous system: Intubated but awake, responsive, moving all extremities Extremities: 1+ lower extremity edema. Skin: Port site is clean dry and intact, large contusions over her arms on the right, arterial line on right, central line placed on right Psychiatry: Unable to assess due to medical condition    Data Reviewed: I have personally reviewed following labs and imaging studies  CBC: Recent Labs  Lab 10/18/2018 0855 10/20/2018 0635 10/11/2018 1041  10/26/2018 2044  10/30/18 1558 10/31/18 0310 10/31/18 1049 10/31/18 1850 11/01/18 0045 11/01/18 0436  WBC 26.3* 18.5* 6.4  --  2.3*  --  0.7* 0.6*  --   --   --  0.2*  NEUTROABS 23.1* 14.2*  --   --  1.1*  --   --   --   --   --   --   --  HGB 7.9* 8.0* 8.5*   < > 6.1*   < > 10.2* 10.5* 8.8* 8.6* 8.4* 8.8*  HCT 24.8* 26.7* 27.6*   < > 20.3*   < > 31.2* 32.6* 27.4* 26.6*  26.4* 27.5*  MCV 92.2 98.2 95.5  --  96.7  --  87.9 88.3  --   --   --  89.9  PLT 151 205 78*  --  35*  --  21* 52*  --   --   --  46*  48*   < > = values in this interval not displayed.   Basic Metabolic Panel: Recent Labs  Lab 10/15/2018 0855  10/22/2018 2044 10/30/18 0845 10/31/18 0310 10/31/18 1049 10/31/18 1850 10/31/18 2137 11/01/18 0436  NA 138   < > 141 143 148*  --   --  150* 147*  K 3.8   < > 3.4* 2.8* 2.3*  --   --  2.2* 2.9*  CL 114*   < > 119* 118* 115*  --   --  117* 115*  CO2 20*   < > 14* 18* 23  --   --  25 25  GLUCOSE 111*   < > 136* 112* 87  --   --  77 87  BUN 20   < > 24* 25* 16  --   --  12 11  CREATININE 0.36*   < > 1.07* 0.87 0.64  --   --  0.50 0.42*  CALCIUM 7.4*   < > 6.4* 6.3* 10.2  --   --  9.4 8.9  MG 2.3  --   --   --  1.6* 1.5* 1.4*  --  2.0  PHOS 2.2*  --   --   --   --  2.2* 2.0*  --  2.6   < > = values in this interval not displayed.   GFR: Estimated Creatinine Clearance: 62.7 mL/min (A) (by C-G formula based on SCr of 0.42 mg/dL (L)). Liver Function Tests: Recent Labs  Lab 10/21/2018 1356 10/31/18 0310 11/01/18 0436  AST 17 35 19  ALT _0 ALKPHOS 122 126 83  BILITOT 0.8 1.4* 1.5*  PROT 4.3* 5.5* 4.6*  ALBUMIN 1.9* 2.7* 2.2*   No results for input(s): LIPASE, AMYLASE in the last 168 hours. No results for input(s): AMMONIA in the last 168 hours. Coagulation Profile: Recent Labs  Lab 10/30/18 1558 11/01/18 0436  INR 1.51 1.38   Cardiac Enzymes: No results for input(s): CKTOTAL, CKMB, CKMBINDEX, TROPONINI in the last 168 hours. BNP (last 3 results) No results for input(s): PROBNP in the last 8760 hours. HbA1C: No results for input(s): HGBA1C in the last 72 hours. CBG: Recent Labs  Lab 10/31/18 1919 10/31/18 2025 10/31/18 2310 10/31/18 2359 11/01/18 0305  GLUCAP 67* 95 65* 98 75   Lipid Profile: Recent Labs    10/31/18 0310 11/01/18 0435  TRIG 126 182*   Thyroid Function Tests: No results for input(s): TSH,  T4TOTAL, FREET4, T3FREE, THYROIDAB in the last 72 hours. Anemia Panel: Recent Labs    11/01/18 0436  FERRITIN 2,066*  TIBC 138*  IRON 13*  RETICCTPCT 0.3*   Sepsis Labs: Recent Labs  Lab 10/19/2018 1355 11/03/2018 1731  LATICACIDVEN 3.5* 1.5    Recent Results (from the past 240 hour(s))  MRSA PCR Screening     Status: None   Collection Time: 10/11/2018  6:19 PM  Result Value Ref Range Status   MRSA by PCR NEGATIVE NEGATIVE  Final    Comment:        The GeneXpert MRSA Assay (FDA approved for NASAL specimens only), is one component of a comprehensive MRSA colonization surveillance program. It is not intended to diagnose MRSA infection nor to guide or monitor treatment for MRSA infections. Performed at [facility], [place] [address], [place], [place] [address]   Culture, respiratory (non-expectorated)     Status: None   Collection Time: 11/01/2018  9:03 AM  Result Value Ref Range Status   Specimen Description   Final    TRACHEAL ASPIRATE Performed at [facility] [address] [place], [place] [address]    Special Requests   Final    NONE Performed at [facility], [place] [address] [place], [place] [address]    Gram Stain   Final    NO WBC SEEN NO SQUAMOUS EPITHELIAL CELLS SEEN MODERATE BUDDING YEAST SEEN RARE GRAM POSITIVE RODS RARE GRAM NEGATIVE RODS Performed at [facility], [address] [place], [place] [address]    Culture MODERATE CANDIDA TROPICALIS  Final   Report Status 10/31/2018 FINAL  Final         Radiology Studies: Dg Chest 1 View  Result Date: 10/31/2018 CLINICAL DATA:  Endotracheal tube placement EXAM: CHEST  1 VIEW COMPARISON:  10/31/2018 chest radiograph FINDINGS: Endotracheal tube tip is at the level of the clavicular heads. Orogastric tube tip and side port project over the left heart, likely within the hiatal hernia. Right internal jugular vein approach central venous catheter  tip projects over the upper SVC. Unchanged position of right chest wall AICD leads. Bibasilar atelectasis and pleural effusions. Unchanged left basilar consolidation. IMPRESSION: 1. Endotracheal tube tip at the level of the clavicular heads. 2. Orogastric tube tip and side port project over the left heart, likely within the hiatal hernia. 3. Right IJ approach CVC tip projects over the upper SVC. 4. Left basilar consolidation, bilateral pleural effusions and bibasilar atelectasis. Electronically Signed   By: Ulyses Jarred M.D.   On: 10/31/2018 05:33   Dg Abd 1 View  Result Date: 10/31/2018 CLINICAL DATA:  Advancement of OG tube EXAM: ABDOMEN - 1 VIEW COMPARISON:  Earlier same day; chest radiograph-earlier same day; CT abdomen pelvis-08/31/2018 FINDINGS: Caudal aspect of enteric tube appears coiled over lying the expected location of the known large hiatal hernia. Paucity of bowel gas without evidence of enteric obstruction. Pacer leads overlie the lower chest. Suspected small to moderate size left-sided effusion with associated left basilar opacities, atelectasis versus infiltrate. No supine evidence of pneumoperitoneum. No definite pneumatosis or portal venous gas. No definitive abnormal intra-abdominal calcifications. No acute osseus abnormalities. Degenerative change of the lower lumbar spine. IMPRESSION: 1. Enteric tube appears coiled overlying the known large hiatal hernia demonstrated on CT of the abdomen and pelvis performed 08/31/2018. 2. Small to moderate size left-sided effusion with associated left basilar opacities, atelectasis versus infiltrate. Electronically Signed   By: Sandi Mariscal M.D.   On: 10/31/2018 08:02   Dg Chest Port 1 View  Result Date: 11/01/2018 CLINICAL DATA:  Pulmonary edema EXAM: PORTABLE CHEST 1 VIEW COMPARISON:  Yesterday FINDINGS: Endotracheal tube tip below the clavicular heads. Dual-chamber pacer leads in stable position. Right IJ central line with tip at the SVC. An  orogastric tube has been removed. Haziness of the lower chest attributed to pleural fluid and presumed atelectasis. No evident pneumothorax. Stable cardiomegaly. Perihilar edema. IMPRESSION: Unchanged pleural effusions, pulmonary edema, and presumed atelectasis.  Electronically Signed   By: Monte Fantasia M.D.   On: 11/01/2018 06:05   Dg Chest Port 1 View  Result Date: 10/31/2018 CLINICAL DATA:  Shortness of breath EXAM: PORTABLE CHEST 1 VIEW COMPARISON:  10/30/2018, 10/12/2018, 09/21/2018 FINDINGS: Right-sided pacing device as before. Small moderate bilateral pleural effusions with bibasilar airspace disease. Cardiomegaly. Central vascular congestion. Large hiatal hernia. IMPRESSION: 1. Small moderate bilateral pleural effusions, new on the right side with worsened airspace disease at the right base and persistent dense left lung base consolidation. 2. Cardiomegaly with vascular congestion 3. Large hiatal hernia Electronically Signed   By: Donavan Foil M.D.   On: 10/31/2018 02:03   Dg Chest Port 1 View  Result Date: 10/30/2018 CLINICAL DATA:  Respiratory failure with aspiration pneumonia and hematemesis. EXAM: PORTABLE CHEST 1 VIEW COMPARISON:  10/17/2018 FINDINGS: ET tube tip is above the carina. Right IJ catheter tip is projecting over the SVC. Right chest wall pacer device is noted with leads in the right atrial appendage and right ventricle. Stable heart size. Large hiatal hernia appears unchanged. Airspace disease within the left midlung and left base is again identified and appears unchanged. Pulmonary edema is decreased in the interval. IMPRESSION: 1. Persistent left midlung and left base airspace densities. 2. Decrease in pulmonary edema. Electronically Signed   By: Kerby Moors M.D.   On: 10/30/2018 11:02   Dg Abd Portable 1v  Result Date: 10/31/2018 CLINICAL DATA:  Feeding tube placement EXAM: PORTABLE ABDOMEN - 1 VIEW COMPARISON:  Portable exam 1135 hours compared to 0741 hours  Correlation: CT abdomen and pelvis 08/31/2018 FINDINGS: Tip of feeding tube projects over the expected position of the LEFT diaphragm; patient has a large hiatal hernia, feeding tube likely within stomach in the inferior chest. Pacemaker leads unchanged. Enlargement of cardiac silhouette. Atelectasis versus consolidation LEFT lower lobe. Visualized bowel gas pattern normal. IMPRESSION: Tip of feeding tube projects over the LEFT diaphragm in a patient with a known large hiatal hernia,; suspect feeding tube is within the hiatal hernia. Electronically Signed   By: Lavonia Dana M.D.   On: 10/31/2018 12:24   Dg Abd Portable 1v  Result Date: 10/31/2018 CLINICAL DATA:  OG tube placement EXAM: PORTABLE ABDOMEN - 1 VIEW COMPARISON:  10/19/2018; chest radiograph - earlier same day; CT abdomen and pelvis - 08/31/2018 FINDINGS: Enteric tube not identified, however overlies expected location of the large hiatal hernia on chest radiograph performed earlier same day. Paucity of bowel gas without evidence of enteric obstruction. Pacer leads overlie the lower chest. No supine evidence of pneumoperitoneum. No definite pneumatosis or portal venous gas. No definitive abnormal intra-abdominal calcifications. No acute osseus abnormalities. Degenerative change of the lower lumbar spine. IMPRESSION: Enteric tube not identified, however overlies the expected location of the known large hiatal hernia demonstrated on chest radiograph performed earlier same day. Large hiatal hernia was noted to contain near the entirety of the stomach on CT the abdomen pelvis performed 08/31/2018. Electronically Signed   By: Sandi Mariscal M.D.   On: 10/31/2018 08:00        Scheduled Meds: . sodium chloride   Intravenous Once  . acyclovir  400 mg Oral BID  . allopurinol  300 mg Oral Daily  . bisacodyl  10 mg Rectal Once  . chlorhexidine gluconate (MEDLINE KIT)  15 mL Mouth Rinse BID  . Chlorhexidine Gluconate Cloth  6 each Topical Daily  .  collagenase   Topical Daily  . feeding supplement (PRO-STAT SUGAR FREE 64)  30 mL Per Tube Daily  . feeding supplement (VITAL AF 1.2 CAL)  1,000 mL Per Tube Q24H  . mouth rinse  15 mL Mouth Rinse 10 times per day  . sodium chloride flush  10-40 mL Intracatheter Q12H   Continuous Infusions: . dextrose Stopped (11/01/18 0617)  . fentaNYL infusion INTRAVENOUS Stopped (10/30/18 0730)  . fluconazole (DIFLUCAN) IV    . norepinephrine (LEVOPHED) Adult infusion Stopped (10/30/18 0618)  . pantoprozole (PROTONIX) infusion 8 mg/hr (11/01/18 0827)  . phenylephrine (NEO-SYNEPHRINE) Adult infusion Stopped (10/26/2018 1500)  . piperacillin-tazobactam (ZOSYN)  IV 12.5 mL/hr at 11/01/18 0827  . potassium chloride Stopped (11/01/18 0844)  . propofol (DIPRIVAN) infusion 12.5 mcg/kg/min (11/01/18 0827)  . sodium chloride    . vasopressin (PITRESSIN) infusion - *FOR SHOCK* Stopped (10/30/18 0630)     LOS: 5 days    Time spent: [place], MD [facility]  If 7PM-7AM, please contact night-coverage [email or web address] Password [facility] 11/01/2018, 8:44 AM

## 2018-11-01 NOTE — Progress Notes (Signed)
[facility] Progress Note Patient Name: Patricia Murillo DOB: 01/31/1946 MRN: [id number]   Date of Service  11/01/2018  HPI/Events of Note  K+ = 2.9 and Creatinine = 0.42  eICU Interventions  Will replace K+.     Intervention Category Major Interventions: Electrolyte abnormality - evaluation and management  Joleena Weisenburger [place] 11/01/2018, 5:42 AM

## 2018-11-01 NOTE — Progress Notes (Signed)
CRITICAL VALUE ALERT  Critical Value:  WBC 0.2  Date & Time Notied: 11/01/18 0522  Provider Notified: Dr. Oletta Darter  Orders Received/Actions taken: no actions at this time

## 2018-11-01 NOTE — Progress Notes (Signed)
No formal consult. LCSW received message from [facility] regarding patient facility choice.   Patient recently dc to [facility] for rehab.   Please submit CSW consult if CSW needs arise.   Patricia Murillo

## 2018-11-01 NOTE — Progress Notes (Signed)
Patricia Murillo   DOB:July 04, 1946   QI#:[id number]   LOV#:[id number]  Hematology and oncology follow-up note  Subjective: Pt remained to be intubated, sedated, hemodynamic stable.  She still has melena stools, hemoglobin slightly dropped this morning.  No other signs of bleeding.  Objective:  Vitals:   11/01/18 1147 11/01/18 1200  BP:  126/63  Pulse: 94 (!) 104  Resp: (!) 23 16  Temp:  99.8 F (37.7 C)  SpO2: 98% 94%    Body mass index is 24.07 kg/m.  Intake/Output Summary (Last 24 hours) at 11/01/2018 1654 Last data filed at 11/01/2018 1534 Gross per 24 hour  Intake 2135.64 ml  Output 4450 ml  Net -2314.36 ml     Pt intubated and sedated   Lungs clear -- no rales or rhonchi  Heart regular rate and rhythm  Abdomensoft  Skin: Diffuse ecchymosis on bilateral arms   CBG (last 3)  Recent Labs    11/01/18 0806 11/01/18 1244 11/01/18 1539  GLUCAP 73 81 79     Labs:  Lab Results  Component Value Date   WBC 0.2 (LL) 11/01/2018   HGB 8.7 (L) 11/01/2018   HCT 27.5 (L) 11/01/2018   MCV 89.9 11/01/2018   PLT 48 (L) 11/01/2018   PLT 46 (L) 11/01/2018   NEUTROABS 1.1 (L) 10/05/2018   CMP Latest Ref Rng & Units 11/01/2018 10/31/2018 10/31/2018  Glucose 70 - 99 mg/dL 87 77 87  BUN 8 - 23 mg/dL 11 12 16   Creatinine 0.44 - 1.00 mg/dL 0.42(L) 0.50 0.64  Sodium 135 - 145 mmol/L 147(H) 150(H) 148(H)  Potassium 3.5 - 5.1 mmol/L 2.9(L) 2.2(LL) 2.3(LL)  Chloride 98 - 111 mmol/L 115(H) 117(H) 115(H)  CO2 22 - 32 mmol/L 25 25 23   Calcium 8.9 - 10.3 mg/dL 8.9 9.4 10.2  Total Protein 6.5 - 8.1 g/dL 4.6(L) - 5.5(L)  Total Bilirubin 0.3 - 1.2 mg/dL 1.5(H) - 1.4(H)  Alkaline Phos 38 - 126 U/L 83 - 126  AST 15 - 41 U/L 19 - 35  ALT 0 - 44 U/L 14 - 20     Urine Studies No results for input(s): UHGB, CRYS in the last 72 hours.  Invalid input(s): UACOL, UAPR, USPG, UPH, UTP, UGL, UKET, UBIL, UNIT, UROB, ULEU, UEPI, UWBC, Junie Panning [place], [place], [place]  Basic Metabolic Panel: Recent  Labs  Lab 10/23/2018 0855  10/25/2018 2044 10/30/18 0845 10/31/18 0310 10/31/18 1049 10/31/18 1850 10/31/18 2137 11/01/18 0436  NA 138   < > 141 143 148*  --   --  150* 147*  K 3.8   < > 3.4* 2.8* 2.3*  --   --  2.2* 2.9*  CL 114*   < > 119* 118* 115*  --   --  117* 115*  CO2 20*   < > 14* 18* 23  --   --  25 25  GLUCOSE 111*   < > 136* 112* 87  --   --  77 87  BUN 20   < > 24* 25* 16  --   --  12 11  CREATININE 0.36*   < > 1.07* 0.87 0.64  --   --  0.50 0.42*  CALCIUM 7.4*   < > 6.4* 6.3* 10.2  --   --  9.4 8.9  MG 2.3  --   --   --  1.6* 1.5* 1.4*  --  2.0  PHOS 2.2*  --   --   --   --  2.2* 2.0*  --  2.6   < > = values in this interval not displayed.   GFR Estimated Creatinine Clearance: 62.7 mL/min (A) (by C-G formula based on SCr of 0.42 mg/dL (L)). Liver Function Tests: Recent Labs  Lab 10/06/2018 1356 10/31/18 0310 11/01/18 0436  AST 17 35 19  ALT 10 20 14   ALKPHOS 122 126 83  BILITOT 0.8 1.4* 1.5*  PROT 4.3* 5.5* 4.6*  ALBUMIN 1.9* 2.7* 2.2*   No results for input(s): LIPASE, AMYLASE in the last 168 hours. No results for input(s): AMMONIA in the last 168 hours. Coagulation profile Recent Labs  Lab 10/30/18 1558 11/01/18 0436  INR 1.51 1.38    CBC: Recent Labs  Lab 10/14/2018 0855 10/30/2018 0635 11/01/2018 1041  10/17/2018 2044  10/30/18 1558 10/31/18 0310  10/31/18 1850 11/01/18 0045 11/01/18 0436 11/01/18 0916 11/01/18 1624  WBC 26.3* 18.5* 6.4  --  2.3*  --  0.7* 0.6*  --   --   --  0.2*  --   --   NEUTROABS 23.1* 14.2*  --   --  1.1*  --   --   --   --   --   --   --   --   --   HGB 7.9* 8.0* 8.5*   < > 6.1*   < > 10.2* 10.5*   < > 8.6* 8.4* 8.8* 8.1* 8.7*  HCT 24.8* 26.7* 27.6*   < > 20.3*   < > 31.2* 32.6*   < > 26.6* 26.4* 27.5* 25.0* 27.5*  MCV 92.2 98.2 95.5  --  96.7  --  87.9 88.3  --   --   --  89.9  --   --   PLT 151 205 78*  --  35*  --  21* 52*  --   --   --  46*  48*  --   --    < > = values in this interval not displayed.   Cardiac  Enzymes: No results for input(s): CKTOTAL, CKMB, CKMBINDEX, TROPONINI in the last 168 hours. BNP: Invalid input(s): POCBNP CBG: Recent Labs  Lab 10/31/18 2359 11/01/18 0305 11/01/18 0806 11/01/18 1244 11/01/18 1539  GLUCAP 98 75 73 81 79   D-Dimer Recent Labs    11/01/18 0436  DDIMER 4.75*   Hgb A1c No results for input(s): HGBA1C in the last 72 hours. Lipid Profile Recent Labs    10/31/18 0310 11/01/18 0435  TRIG 126 182*   Thyroid function studies No results for input(s): TSH, T4TOTAL, T3FREE, THYROIDAB in the last 72 hours.  Invalid input(s): FREET3 Anemia work up Recent Labs    11/01/18 0436  FERRITIN 2,066*  TIBC 138*  IRON 13*  RETICCTPCT 0.3*   Microbiology Recent Results (from the past 240 hour(s))  MRSA PCR Screening     Status: None   Collection Time: 10/26/2018  6:19 PM  Result Value Ref Range Status   MRSA by PCR NEGATIVE NEGATIVE Final    Comment:        The GeneXpert MRSA Assay (FDA approved for NASAL specimens only), is one component of a comprehensive MRSA colonization surveillance program. It is not intended to diagnose MRSA infection nor to guide or monitor treatment for MRSA infections. Performed at [facility], [place] [address] [place], [place] [address]   Culture, respiratory (non-expectorated)     Status: None   Collection Time: 10/09/2018  9:03 AM  Result Value Ref Range Status  Specimen Description   Final    TRACHEAL ASPIRATE Performed at [place] [address] [place], [place] [address]    Special Requests   Final    NONE Performed at [facility], [place] [address] [place], [place] [address]    Gram Stain   Final    NO WBC SEEN NO SQUAMOUS EPITHELIAL CELLS SEEN MODERATE BUDDING YEAST SEEN RARE GRAM POSITIVE RODS RARE GRAM NEGATIVE RODS Performed at [facility], [address] [place], [place] [address]    Culture MODERATE CANDIDA TROPICALIS   Final   Report Status 10/31/2018 FINAL  Final      Studies:  Dg Chest 1 View  Result Date: 10/31/2018 CLINICAL DATA:  Endotracheal tube placement EXAM: CHEST  1 VIEW COMPARISON:  10/31/2018 chest radiograph FINDINGS: Endotracheal tube tip is at the level of the clavicular heads. Orogastric tube tip and side port project over the left heart, likely within the hiatal hernia. Right internal jugular vein approach central venous catheter tip projects over the upper SVC. Unchanged position of right chest wall AICD leads. Bibasilar atelectasis and pleural effusions. Unchanged left basilar consolidation. IMPRESSION: 1. Endotracheal tube tip at the level of the clavicular heads. 2. Orogastric tube tip and side port project over the left heart, likely within the hiatal hernia. 3. Right IJ approach CVC tip projects over the upper SVC. 4. Left basilar consolidation, bilateral pleural effusions and bibasilar atelectasis. Electronically Signed   By: Ulyses Jarred M.D.   On: 10/31/2018 05:33   Dg Abd 1 View  Result Date: 10/31/2018 CLINICAL DATA:  Advancement of OG tube EXAM: ABDOMEN - 1 VIEW COMPARISON:  Earlier same day; chest radiograph-earlier same day; CT abdomen pelvis-08/31/2018 FINDINGS: Caudal aspect of enteric tube appears coiled over lying the expected location of the known large hiatal hernia. Paucity of bowel gas without evidence of enteric obstruction. Pacer leads overlie the lower chest. Suspected small to moderate size left-sided effusion with associated left basilar opacities, atelectasis versus infiltrate. No supine evidence of pneumoperitoneum. No definite pneumatosis or portal venous gas. No definitive abnormal intra-abdominal calcifications. No acute osseus abnormalities. Degenerative change of the lower lumbar spine. IMPRESSION: 1. Enteric tube appears coiled overlying the known large hiatal hernia demonstrated on CT of the abdomen and pelvis performed 08/31/2018. 2. Small to moderate size  left-sided effusion with associated left basilar opacities, atelectasis versus infiltrate. Electronically Signed   By: Sandi Mariscal M.D.   On: 10/31/2018 08:02   Dg Chest Port 1 View  Result Date: 11/01/2018 CLINICAL DATA:  Pulmonary edema EXAM: PORTABLE CHEST 1 VIEW COMPARISON:  Yesterday FINDINGS: Endotracheal tube tip below the clavicular heads. Dual-chamber pacer leads in stable position. Right IJ central line with tip at the SVC. An orogastric tube has been removed. Haziness of the lower chest attributed to pleural fluid and presumed atelectasis. No evident pneumothorax. Stable cardiomegaly. Perihilar edema. IMPRESSION: Unchanged pleural effusions, pulmonary edema, and presumed atelectasis. Electronically Signed   By: Monte Fantasia M.D.   On: 11/01/2018 06:05   Dg Chest Port 1 View  Result Date: 10/31/2018 CLINICAL DATA:  Shortness of breath EXAM: PORTABLE CHEST 1 VIEW COMPARISON:  10/30/2018, 10/12/2018, 09/21/2018 FINDINGS: Right-sided pacing device as before. Small moderate bilateral pleural effusions with bibasilar airspace disease. Cardiomegaly. Central vascular congestion. Large hiatal hernia. IMPRESSION: 1. Small moderate bilateral pleural effusions, new on the right side with worsened airspace disease at the right base and persistent dense left lung base consolidation. 2. Cardiomegaly  with vascular congestion 3. Large hiatal hernia Electronically Signed   By: Donavan Foil M.D.   On: 10/31/2018 02:03   Dg Abd Portable 1v  Result Date: 10/31/2018 CLINICAL DATA:  Feeding tube placement EXAM: PORTABLE ABDOMEN - 1 VIEW COMPARISON:  Portable exam 1135 hours compared to 0741 hours Correlation: CT abdomen and pelvis 08/31/2018 FINDINGS: Tip of feeding tube projects over the expected position of the LEFT diaphragm; patient has a large hiatal hernia, feeding tube likely within stomach in the inferior chest. Pacemaker leads unchanged. Enlargement of cardiac silhouette. Atelectasis versus  consolidation LEFT lower lobe. Visualized bowel gas pattern normal. IMPRESSION: Tip of feeding tube projects over the LEFT diaphragm in a patient with a known large hiatal hernia,; suspect feeding tube is within the hiatal hernia. Electronically Signed   By: Lavonia Dana M.D.   On: 10/31/2018 12:24   Dg Abd Portable 1v  Result Date: 10/31/2018 CLINICAL DATA:  OG tube placement EXAM: PORTABLE ABDOMEN - 1 VIEW COMPARISON:  10/25/2018; chest radiograph - earlier same day; CT abdomen and pelvis - 08/31/2018 FINDINGS: Enteric tube not identified, however overlies expected location of the large hiatal hernia on chest radiograph performed earlier same day. Paucity of bowel gas without evidence of enteric obstruction. Pacer leads overlie the lower chest. No supine evidence of pneumoperitoneum. No definite pneumatosis or portal venous gas. No definitive abnormal intra-abdominal calcifications. No acute osseus abnormalities. Degenerative change of the lower lumbar spine. IMPRESSION: Enteric tube not identified, however overlies the expected location of the known large hiatal hernia demonstrated on chest radiograph performed earlier same day. Large hiatal hernia was noted to contain near the entirety of the stomach on CT the abdomen pelvis performed 08/31/2018. Electronically Signed   By: Sandi Mariscal M.D.   On: 10/31/2018 08:00    Assessment: 73 y.o. with recently diagnosed non-Hodgkin diffuse large B-cell lymphoma, status post chemotherapy on 10/24/2018, sacral decubitus, complete heart block status post pacemaker, admitted for upper GI bleeding  1.  Upper GI bleeding secondary to multiple gastric ulcers 2.  Severe pancytopenia, secondary to chemo 3.  Diffuse large B-cell lymphoma, stage IV, status post 3 cycles of chemotherapy R-CHOP, last cycle on October 24, 2018 4.  Aspiration pneumonia with respiratory failure, intubated 5.  Sacral decubitus 6.  Complete heart block status post pacemaker   Plan:  -lab  reviewed, H/H stable overall today, no need additional blood transfusion. Worsening neutropenia, plt slightly dropped. Given the stable H/H, she probably does not need plt transfusion today -DIC and hemolysis lab reviewed, no evidence of hemolysis, or DIC  -continue supportive care, PPI and antibiotics, appreciate ICU and GI teams's care  -I updated her daughter at bedside today, she understands her mother is critically ill, and wishes to continue all medical treatment.  She did not mention that patient does not want prolonged life support if her condition is not reversible. -I will continue f/u   Truitt Merle, MD 11/01/2018  4:54 PM

## 2018-11-01 NOTE — Progress Notes (Signed)
[facility] PROGRESS NOTE Subjective No major changes overnight.  Pathology on ulcers showed benign ulcers with no signs of lymphoma.  Patient did not need transfusion overnight  Objective: Vital signs in last 24 hours: Temp:  [97.9 F (36.6 C)-100 F (37.8 C)] 100 F (37.8 C) (01/28 0800) Pulse Rate:  [67-92] 91 (01/28 0800) Resp:  [16-21] 18 (01/28 0800) BP: (116-141)/(53-64) 118/59 (01/28 0800) SpO2:  [96 %-100 %] 98 % (01/28 0800) FiO2 (%):  [30 %-40 %] 30 % (01/28 0748) Weight:  [69.7 kg] 69.7 kg (01/28 0438) Last BM Date: 10/31/18  Intake/Output from previous day: 01/27 0701 - 01/28 0700 In: 2632.1 [I.V.:960.1; IV Piggyback:1672] Out: 1950 [BWIOM:3559] Intake/Output this shift: Total I/O In: 161.2 [I.V.:60.6; IV Piggyback:100.6] Out: -   PE: General--heavily sedated and intubated nonresponsive  Abdomen--nondistended nontender  Lab Results: Recent Labs    10/28/2018 1041  10/17/2018 2044  10/30/18 1558 10/31/18 0310 10/31/18 1049 10/31/18 1850 11/01/18 0045 11/01/18 0436  WBC 6.4  --  2.3*  --  0.7* 0.6*  --   --   --  0.2*  HGB 8.5*   < > 6.1*   < > 10.2* 10.5* 8.8* 8.6* 8.4* 8.8*  HCT 27.6*   < > 20.3*   < > 31.2* 32.6* 27.4* 26.6* 26.4* 27.5*  PLT 78*  --  35*  --  21* 52*  --   --   --  46*  48*   < > = values in this interval not displayed.   BMET Recent Labs    10/25/2018 2044 10/30/18 0845 10/31/18 0310 10/31/18 2137 11/01/18 0436  NA 141 143 148* 150* 147*  K 3.4* 2.8* 2.3* 2.2* 2.9*  CL 119* 118* 115* 117* 115*  CO2 14* 18* 23 25 25   CREATININE 1.07* 0.87 0.64 0.50 0.42*   LFT Recent Labs    10/31/18 0310 11/01/18 0436  PROT 5.5* 4.6*  AST 35 19  ALT 20 14  ALKPHOS 126 83  BILITOT 1.4* 1.5*   PT/INR Recent Labs    10/30/18 1558 11/01/18 0436  LABPROT 18.1* 16.8*  INR 1.51 1.38   PANCREAS No results for input(s): LIPASE in the last 72 hours.       Studies/Results: Dg Chest 1 View  Result Date:  10/31/2018 CLINICAL DATA:  Endotracheal tube placement EXAM: CHEST  1 VIEW COMPARISON:  10/31/2018 chest radiograph FINDINGS: Endotracheal tube tip is at the level of the clavicular heads. Orogastric tube tip and side port project over the left heart, likely within the hiatal hernia. Right internal jugular vein approach central venous catheter tip projects over the upper SVC. Unchanged position of right chest wall AICD leads. Bibasilar atelectasis and pleural effusions. Unchanged left basilar consolidation. IMPRESSION: 1. Endotracheal tube tip at the level of the clavicular heads. 2. Orogastric tube tip and side port project over the left heart, likely within the hiatal hernia. 3. Right IJ approach CVC tip projects over the upper SVC. 4. Left basilar consolidation, bilateral pleural effusions and bibasilar atelectasis. Electronically Signed   By: Ulyses Jarred M.D.   On: 10/31/2018 05:33   Dg Abd 1 View  Result Date: 10/31/2018 CLINICAL DATA:  Advancement of OG tube EXAM: ABDOMEN - 1 VIEW COMPARISON:  Earlier same day; chest radiograph-earlier same day; CT abdomen pelvis-08/31/2018 FINDINGS: Caudal aspect of enteric tube appears coiled over lying the expected location of the known large hiatal hernia. Paucity of bowel gas without evidence of enteric obstruction. Pacer leads overlie the lower  chest. Suspected small to moderate size left-sided effusion with associated left basilar opacities, atelectasis versus infiltrate. No supine evidence of pneumoperitoneum. No definite pneumatosis or portal venous gas. No definitive abnormal intra-abdominal calcifications. No acute osseus abnormalities. Degenerative change of the lower lumbar spine. IMPRESSION: 1. Enteric tube appears coiled overlying the known large hiatal hernia demonstrated on CT of the abdomen and pelvis performed 08/31/2018. 2. Small to moderate size left-sided effusion with associated left basilar opacities, atelectasis versus infiltrate. Electronically  Signed   By: Sandi Mariscal M.D.   On: 10/31/2018 08:02   Dg Chest Port 1 View  Result Date: 11/01/2018 CLINICAL DATA:  Pulmonary edema EXAM: PORTABLE CHEST 1 VIEW COMPARISON:  Yesterday FINDINGS: Endotracheal tube tip below the clavicular heads. Dual-chamber pacer leads in stable position. Right IJ central line with tip at the SVC. An orogastric tube has been removed. Haziness of the lower chest attributed to pleural fluid and presumed atelectasis. No evident pneumothorax. Stable cardiomegaly. Perihilar edema. IMPRESSION: Unchanged pleural effusions, pulmonary edema, and presumed atelectasis. Electronically Signed   By: Monte Fantasia M.D.   On: 11/01/2018 06:05   Dg Chest Port 1 View  Result Date: 10/31/2018 CLINICAL DATA:  Shortness of breath EXAM: PORTABLE CHEST 1 VIEW COMPARISON:  10/30/2018, 10/18/2018, 09/21/2018 FINDINGS: Right-sided pacing device as before. Small moderate bilateral pleural effusions with bibasilar airspace disease. Cardiomegaly. Central vascular congestion. Large hiatal hernia. IMPRESSION: 1. Small moderate bilateral pleural effusions, new on the right side with worsened airspace disease at the right base and persistent dense left lung base consolidation. 2. Cardiomegaly with vascular congestion 3. Large hiatal hernia Electronically Signed   By: Donavan Foil M.D.   On: 10/31/2018 02:03   Dg Chest Port 1 View  Result Date: 10/30/2018 CLINICAL DATA:  Respiratory failure with aspiration pneumonia and hematemesis. EXAM: PORTABLE CHEST 1 VIEW COMPARISON:  10/12/2018 FINDINGS: ET tube tip is above the carina. Right IJ catheter tip is projecting over the SVC. Right chest wall pacer device is noted with leads in the right atrial appendage and right ventricle. Stable heart size. Large hiatal hernia appears unchanged. Airspace disease within the left midlung and left base is again identified and appears unchanged. Pulmonary edema is decreased in the interval. IMPRESSION: 1. Persistent left  midlung and left base airspace densities. 2. Decrease in pulmonary edema. Electronically Signed   By: Kerby Moors M.D.   On: 10/30/2018 11:02   Dg Abd Portable 1v  Result Date: 10/31/2018 CLINICAL DATA:  Feeding tube placement EXAM: PORTABLE ABDOMEN - 1 VIEW COMPARISON:  Portable exam 1135 hours compared to 0741 hours Correlation: CT abdomen and pelvis 08/31/2018 FINDINGS: Tip of feeding tube projects over the expected position of the LEFT diaphragm; patient has a large hiatal hernia, feeding tube likely within stomach in the inferior chest. Pacemaker leads unchanged. Enlargement of cardiac silhouette. Atelectasis versus consolidation LEFT lower lobe. Visualized bowel gas pattern normal. IMPRESSION: Tip of feeding tube projects over the LEFT diaphragm in a patient with a known large hiatal hernia,; suspect feeding tube is within the hiatal hernia. Electronically Signed   By: Lavonia Dana M.D.   On: 10/31/2018 12:24   Dg Abd Portable 1v  Result Date: 10/31/2018 CLINICAL DATA:  OG tube placement EXAM: PORTABLE ABDOMEN - 1 VIEW COMPARISON:  10/13/2018; chest radiograph - earlier same day; CT abdomen and pelvis - 08/31/2018 FINDINGS: Enteric tube not identified, however overlies expected location of the large hiatal hernia on chest radiograph performed earlier same day. Paucity of bowel  gas without evidence of enteric obstruction. Pacer leads overlie the lower chest. No supine evidence of pneumoperitoneum. No definite pneumatosis or portal venous gas. No definitive abnormal intra-abdominal calcifications. No acute osseus abnormalities. Degenerative change of the lower lumbar spine. IMPRESSION: Enteric tube not identified, however overlies the expected location of the known large hiatal hernia demonstrated on chest radiograph performed earlier same day. Large hiatal hernia was noted to contain near the entirety of the stomach on CT the abdomen pelvis performed 08/31/2018. Electronically Signed   By: Sandi Mariscal  M.D.   On: 10/31/2018 08:00    Medications: I have reviewed the patient's current medications.  Assessment:   1.  Multiple gastric ulcers.  Fortunately pathology benign for lymphoma seems to be relatively stable and hopefully will continue to improve his bone marrow recovers from chemotherapy. 2.  Respiratory failure secondary to aspiration.  Patient on ventilator heavily sedated on antibiotics 3.  Nutrition.  Unable to pass feeding tube due to hiatal hernia.   Plan: Would continue on IV PPI therapy.  If bone marrow continues to improve within the next couple of days could consider trying to place nasogastric tube by IR and initiating low-grade feeding.   Nancy Fetter 11/01/2018, 9:44 AM  This note was created using voice recognition software. Minor errors may Have occurred unintentionally.  Pager: [phone] If no answer or after hours call [phone]

## 2018-11-01 NOTE — Progress Notes (Signed)
NAME:  Patricia Murillo, MRN:  [id number], DOB:  July 02, 1946, LOS: 5 ADMISSION DATE:  10/13/2018, CONSULTATION DATE:  REFERRING MD:  Dr Wilson Singer, CHIEF COMPLAINT: Hematemesis  Brief History   Patient was brought into the hospital today for hematemesis this afternoon Described about 100 cc of bright red blood, brought in from a nursing home She did bring up a little amount about an hour ago, complaining of nausea She did decompensate overnight 10/08/2018 Nausea with vomiting of coffee grounds Aspirated and further decompensated,Intubated and on ventilator at present Patient rallied significantly overnight Off pressors, awake and interactive  History of present illness   Patient with a history of lymphoma, status post chemotherapy about 2 days ago Extensive disease from recent CT chest from 09/07/2089 She was recently hospitalized for severe anemia, left arm swelling Started R-CHOP and discharged to skilled nursing facility on 10/05/2018 She was doing well up until day of presentation when she had the episode of hematemesis She was not feeling acutely sick, did have associated nausea  Post EGD-revealing ulcers-nonbleeding Had coffee-ground emesis during the night Aspirated and went into respiratory failure requiring intubation Hypoxemic respiratory failure-was hemodynamically stable during the process but became hypotensive afterwards Awake and interactive  Past Medical History   Past Medical History:  Diagnosis Date  . Arthritis   . Hiatal hernia   . History of chicken pox   . Hyperlipidemia   . Non Hodgkin's lymphoma ([place])   . Pacemaker    Significant Hospital Events   Has remained edema dynamically stable 1/24-complaining of nausea vomiting, vomited a few times and aspirated, progressed to respiratory failure, required intubation.  Hemodynamic compromise following initial stabilization Significant amount of coffee grounds noted in endotracheal tube following  intubation 1/25-weaned off pressors.  Awake, interactive, moving all extremities  Consults:  GI-post endoscopy significant for multiple ulcers-nonbleeding Repeat endoscopy on 11/01/2018-showed gastric edema, hemospray.  Large amounts of altered blood noted in the stomach  Procedures:  10/19/2018 right IJ placed 10/12/2018 endotracheal tube placed  Significant Diagnostic Tests:  Last CT chest was 09/07/2018 showing progression of lymphoma, multiple masses and extensive adenopathy CT abdomen 08/31/2018 also did reveal extensive adenopathy Chest x-ray on 10/17/2018-no new infiltrative process, large hiatal hernia, tube appropriately positioned Chest x-ray post line placement noted-no pneumothorax, fluffy infiltrate on left TTE 1/27 >> normal LV size, systolic function mild to moderately reduced, EF 40 to 45% (decreased from 55 to 60% 08/31/2018)  Micro Data:  Respiratory 1/25 >> Candida tropicalis  Antimicrobials:  Zosyn 10/31/2018>> Fluconazole 1/27 >>   Interim history/subjective:  No issues overnight except for some hyperglycemia.  Note that D10 was started Remains off pressors Sedation being lightened this morning  Objective   Blood pressure (!) 118/59, pulse 91, temperature 100 F (37.8 C), temperature source Axillary, resp. rate 18, height 5\' 7"  (1.702 m), weight 69.7 kg, SpO2 98 %. CVP:  [5 mmHg-9 mmHg] 6 mmHg  Vent Mode: PRVC FiO2 (%):  [30 %-40 %] 30 % Set Rate:  [16 bmp] 16 bmp Vt Set:  [490 mL] 490 mL PEEP:  [5 cmH20] 5 cmH20 Plateau Pressure:  [16 cmH20-18 cmH20] 18 cmH20   Intake/Output Summary (Last 24 hours) at 11/01/2018 0916 Last data filed at 11/01/2018 0827 Gross per 24 hour  Intake 2618.07 ml  Output 1950 ml  Net 668.07 ml   Filed Weights   10/30/18 0258 10/31/18 0500 11/01/18 0438  Weight: 74.3 kg 69.7 kg 69.7 kg    Examination: General: Ill-appearing woman, no distress,  ventilated HENT: ET tube is in good position, oropharynx is moist Lungs: B  rhonchi without wheezing Cardiovascular: Regular, borderline tachycardic, no murmurs Abdomen: Soft, obese, nondistended with positive bowel sounds Extremities: Bilateral upper and lower extremity edema with dark ecchymoses on both upper extremities Neuro: Wakes to voice, calm, follows commands and nods to questions.  Good strength in the upper and lower extremities  Resolved Hospital Problem list   Hypocalcemia Shock, likely hemorrhagic plus component of septic  Assessment & Plan:  Acute hypoxemic respiratory failure, presumed due to aspiration event and then overall weakness requiring reintubation 1/27 Continue current ventilation strategy, okay to initiate SBT's VAP prevention orders Consider cardiogenic component here, note change in LVEF Diuretics if she can tolerate on 1/28 Antibiotics as below  Pneumonia, possible aspiration.  Note respiratory culture with Candida tropicalis Zosyn and fluconazole as ordered  Acute blood loss anemia due to ulcers -Secondary to acute GI bleed -Gastric biopsies without any evidence for lymphoma in the gastric lining Goal hemoglobin greater than 8 Continue to follow CBC and for any evidence of blood loss Appreciate gastroenterology's input PPI as ordered, probably convert to twice daily injections 1/29  New left ventricular dysfunction by echocardiogram 1/27.  Consider due to chemotherapy, consider due to contribution of sepsis  Hypokalemia Replace as indicated, 60 mEq given 1/28 Follow BMP, urine output  Hypoglycemia Continue D10 low-dose infusion until we are able to safely get NG tube access for tube feeding  B-cell lymphoma, received chemotherapy week prior to admission Appreciate Dr Ernestina Penna assistance  Neutropenia due to chemotherapy Continue to follow CBC Reviewed Dr. Fritz Pickerel notes, no role for repeat dosing G-CSF (given on 1/22)  Sacral pressure ulcers WOC eval   History of third-degree heart block With pacemaker in  place    Best practice:  Diet: N.p.o. Pain/Anxiety/Delirium protocol (if indicated): Propofol, fentanyl VAP protocol (if indicated):  DVT prophylaxis: SCD, GI bleed GI prophylaxis: On Protonix drip Glucose control: ssi Mobility: Bedrest Code Status: Full code Family Communication: discussed with daughter at bedside 1/28 Disposition: ICU   Labs   CBC: Recent Labs  Lab 10/23/2018 0855 10/11/2018 0635 11/03/2018 1041  10/10/2018 2044  10/30/18 1558 10/31/18 0310 10/31/18 1049 10/31/18 1850 11/01/18 0045 11/01/18 0436  WBC 26.3* 18.5* 6.4  --  2.3*  --  0.7* 0.6*  --   --   --  0.2*  NEUTROABS 23.1* 14.2*  --   --  1.1*  --   --   --   --   --   --   --   HGB 7.9* 8.0* 8.5*   < > 6.1*   < > 10.2* 10.5* 8.8* 8.6* 8.4* 8.8*  HCT 24.8* 26.7* 27.6*   < > 20.3*   < > 31.2* 32.6* 27.4* 26.6* 26.4* 27.5*  MCV 92.2 98.2 95.5  --  96.7  --  87.9 88.3  --   --   --  89.9  PLT 151 205 78*  --  35*  --  21* 52*  --   --   --  46*  48*   < > = values in this interval not displayed.    Basic Metabolic Panel: Recent Labs  Lab 10/16/2018 0855  10/28/2018 2044 10/30/18 0845 10/31/18 0310 10/31/18 1049 10/31/18 1850 10/31/18 2137 11/01/18 0436  NA 138   < > 141 143 148*  --   --  150* 147*  K 3.8   < > 3.4* 2.8* 2.3*  --   --  2.2* 2.9*  CL 114*   < > 119* 118* 115*  --   --  117* 115*  CO2 20*   < > 14* 18* 23  --   --  25 25  GLUCOSE 111*   < > 136* 112* 87  --   --  77 87  BUN 20   < > 24* 25* 16  --   --  12 11  CREATININE 0.36*   < > 1.07* 0.87 0.64  --   --  0.50 0.42*  CALCIUM 7.4*   < > 6.4* 6.3* 10.2  --   --  9.4 8.9  MG 2.3  --   --   --  1.6* 1.5* 1.4*  --  2.0  PHOS 2.2*  --   --   --   --  2.2* 2.0*  --  2.6   < > = values in this interval not displayed.   GFR: Estimated Creatinine Clearance: 62.7 mL/min (A) (by C-G formula based on SCr of 0.42 mg/dL (L)). Recent Labs  Lab 10/23/2018 1355  10/27/2018 1731 10/29/18 2044 10/30/18 1558 10/31/18 0310 11/01/18 0436  WBC   --    < >  --  2.3* 0.7* 0.6* 0.2*  LATICACIDVEN 3.5*  --  1.5  --   --   --   --    < > = values in this interval not displayed.    Liver Function Tests: Recent Labs  Lab 10/11/2018 1356 10/31/18 0310 11/01/18 0436  AST 17 35 19  ALT 10 20 14   ALKPHOS 122 126 83  BILITOT 0.8 1.4* 1.5*  PROT 4.3* 5.5* 4.6*  ALBUMIN 1.9* 2.7* 2.2*   No results for input(s): LIPASE, AMYLASE in the last 168 hours. No results for input(s): AMMONIA in the last 168 hours.  ABG    Component Value Date/Time   PHART 7.418 10/31/2018 0455   PCO2ART 37.3 10/31/2018 0455   PO2ART 89.3 10/31/2018 0455   HCO3 23.7 10/31/2018 0455   TCO2 27 04/18/2018 0838   ACIDBASEDEF 0.2 10/31/2018 0455   O2SAT 97.4 10/31/2018 0455     Coagulation Profile: Recent Labs  Lab 10/30/18 1558 11/01/18 0436  INR 1.51 1.38    Independent CC time 33 minutes  Baltazar Apo, MD, PhD 11/01/2018, 9:16 AM [facility] [phone] or if no answer [phone]

## 2018-11-01 NOTE — Progress Notes (Signed)
[facility] Progress Note Patient Name: Patricia Murillo DOB: 07-27-1946 MRN: [id number]   Date of Service  11/01/2018  HPI/Events of Note  Hypoglycemia X 2 episodes - Blood glucose = 65 and 75.   eICU Interventions  Will order: 1. D10W to run IV at 20 mL/hour.      Intervention Category Major Interventions: Other:  Rosaura Bolon Cornelia Copa 11/01/2018, 4:18 AM

## 2018-11-01 NOTE — Progress Notes (Signed)
CRITICAL VALUE ALERT  Critical Value:  Potassium 2.5  Date & Time Notied:  11/01/18 1802  Provider Notified: Lamonte Sakai, MD  Orders Received/Actions taken: Will page MD, awaiting orders

## 2018-11-02 LAB — CBC
HCT: 28.3 % — ABNORMAL LOW (ref 36.0–46.0)
Hemoglobin: 9 g/dL — ABNORMAL LOW (ref 12.0–15.0)
MCH: 28.7 pg (ref 26.0–34.0)
MCHC: 31.8 g/dL (ref 30.0–36.0)
MCV: 90.1 fL (ref 80.0–100.0)
Platelets: 69 10*3/uL — ABNORMAL LOW (ref 150–400)
RBC: 3.14 MIL/uL — ABNORMAL LOW (ref 3.87–5.11)
RDW: 16.9 % — ABNORMAL HIGH (ref 11.5–15.5)
WBC: 0.4 10*3/uL — CL (ref 4.0–10.5)
nRBC: 0 % (ref 0.0–0.2)

## 2018-11-02 LAB — BASIC METABOLIC PANEL
Anion gap: 9 (ref 5–15)
Anion gap: 11 (ref 5–15)
BUN: 12 mg/dL (ref 8–23)
BUN: 11 mg/dL (ref 8–23)
CHLORIDE: 114 mmol/L — AB (ref 98–111)
CO2: 27 mmol/L (ref 22–32)
CO2: 26 mmol/L (ref 22–32)
CREATININE: 0.53 mg/dL (ref 0.44–1.00)
Calcium: 8.1 mg/dL — ABNORMAL LOW (ref 8.9–10.3)
Calcium: 7.7 mg/dL — ABNORMAL LOW (ref 8.9–10.3)
Chloride: 110 mmol/L (ref 98–111)
Creatinine, Ser: 0.46 mg/dL (ref 0.44–1.00)
GFR calc Af Amer: >60 mL/min (ref >60)
GFR calc Af Amer: >60 mL/min (ref >60)
GFR calc non Af Amer: >60 mL/min (ref >60)
Glucose, Bld: 86 mg/dL (ref 70–99)
Glucose, Bld: 83 mg/dL (ref 70–99)
POTASSIUM: 2.5 mmol/L — AB (ref 3.5–5.1)
Potassium: 2.9 mmol/L — ABNORMAL LOW (ref 3.5–5.1)
SODIUM: 150 mmol/L — AB (ref 135–145)
Sodium: 147 mmol/L — ABNORMAL HIGH (ref 135–145)

## 2018-11-02 LAB — MAGNESIUM
Magnesium: 1.6 mg/dL — ABNORMAL LOW (ref 1.7–2.4)
Magnesium: 2.1 mg/dL (ref 1.7–2.4)

## 2018-11-02 LAB — HEMOGLOBIN AND HEMATOCRIT, BLOOD
HCT: 28 % — ABNORMAL LOW (ref 36.0–46.0)
HCT: 28.5 % — ABNORMAL LOW (ref 36.0–46.0)
HCT: 29.3 % — ABNORMAL LOW (ref 36.0–46.0)
Hemoglobin: 8.8 g/dL — ABNORMAL LOW (ref 12.0–15.0)
Hemoglobin: 8.9 g/dL — ABNORMAL LOW (ref 12.0–15.0)
Hemoglobin: 9.3 g/dL — ABNORMAL LOW (ref 12.0–15.0)

## 2018-11-02 LAB — GLUCOSE, CAPILLARY
GLUCOSE-CAPILLARY: 80 mg/dL (ref 70–99)
GLUCOSE-CAPILLARY: 86 mg/dL (ref 70–99)
Glucose-Capillary: 71 mg/dL (ref 70–99)
Glucose-Capillary: 79 mg/dL (ref 70–99)
Glucose-Capillary: 81 mg/dL (ref 70–99)
Glucose-Capillary: 82 mg/dL (ref 70–99)

## 2018-11-02 LAB — HAPTOGLOBIN: Haptoglobin: 281 mg/dL (ref 42–346)

## 2018-11-02 MED ORDER — PRO-STAT SUGAR FREE PO LIQD: 30.0000 mL | Freq: Three times a day (TID) | ORAL | Status: DC

## 2018-11-02 MED ORDER — POTASSIUM CHLORIDE 10 MEQ/50ML IV SOLN
10.0000 meq | INTRAVENOUS | Status: AC
  Administered 2018-11-02 – 2018-11-03 (×6): 10 meq via INTRAVENOUS
  Filled 2018-11-02 (×6): qty 50

## 2018-11-02 MED ORDER — MAGNESIUM SULFATE 4 GM/100ML IV SOLN
4.0000 g | Freq: Once | INTRAVENOUS | Status: AC
  Administered 2018-11-02: 4 g via INTRAVENOUS
  Filled 2018-11-02 (×2): qty 100

## 2018-11-02 MED ORDER — POTASSIUM CHLORIDE 20 MEQ/15ML (10%) PO SOLN: 40.0000 meq | ORAL | Status: DC

## 2018-11-02 MED ORDER — FUROSEMIDE 10 MG/ML IJ SOLN
40.0000 mg | Freq: Two times a day (BID) | INTRAMUSCULAR | Status: AC
  Administered 2018-11-02 (×2): 40 mg via INTRAVENOUS
  Filled 2018-11-02 (×2): qty 4

## 2018-11-02 MED ORDER — METOPROLOL TARTRATE 5 MG/5ML IV SOLN
2.5000 mg | Freq: Four times a day (QID) | INTRAVENOUS | Status: DC
  Administered 2018-11-02 – 2018-11-07 (×19): 2.5 mg via INTRAVENOUS
  Filled 2018-11-02 (×19): qty 5

## 2018-11-02 MED ORDER — POTASSIUM CHLORIDE 10 MEQ/50ML IV SOLN
10.0000 meq | INTRAVENOUS | Status: AC
  Administered 2018-11-02 (×8): 10 meq via INTRAVENOUS
  Filled 2018-11-02 (×8): qty 50

## 2018-11-02 MED ORDER — VITAL AF 1.2 CAL PO LIQD: 1000.0000 mL | ORAL | Status: DC

## 2018-11-02 MED ORDER — SODIUM CHLORIDE 0.9 % IV SOLN
INTRAVENOUS | Status: DC | PRN
  Administered 2018-11-02: 250 mL via INTRAVENOUS

## 2018-11-02 MED ORDER — DEXTROSE 5 % IV SOLN
INTRAVENOUS | Status: AC
  Administered 2018-11-02 – 2018-11-03 (×2): via INTRAVENOUS

## 2018-11-02 MED ORDER — MAGNESIUM SULFATE 2 GM/50ML IV SOLN: 2.0000 g | Freq: Once | INTRAVENOUS | Status: DC

## 2018-11-02 NOTE — Progress Notes (Signed)
Nutrition Follow-up  DOCUMENTATION CODES:   Severe malnutrition in context of chronic illness  INTERVENTION:  - Once NGT able to be placed by IR: 30 ml Prostat TID with Vital AF 1.2 @ 15 ml/hr to advance by 10 ml every 12 hours to reach goal rate of 35 ml/hr. - TF regimen + current kcal rate will provide 1662 kcal (106% estimated kcal need), 108 grams of protein, and 681 ml free water.   Monitor magnesium, potassium, and phosphorus daily for at least 3 days, MD to replete as needed, as pt is at risk for refeeding syndrome given severe malnutrition, current hypokalemia and mild hypomagnesemia.   NUTRITION DIAGNOSIS:   Severe Malnutrition related to chronic illness, cancer and cancer related treatments as evidenced by percent weight loss, energy intake < or equal to 75% for > or equal to 1 month, moderate fat depletion, severe muscle depletion. -ongoing  GOAL:   Patient will meet greater than or equal to 90% of their needs -unmet/unable to meet  MONITOR:   Vent status, Weight trends, Labs, Skin, Other (Comment)(NGT placement)  REASON FOR ASSESSMENT:   Ventilator  ASSESSMENT:   73 year old with past medical history relevant for hypertension, pain, non-Hodgkin's lymphoma status post chemotherapy 10/24/2018 with R-CHOP, status post permanent pacemaker, sacral decubitus ulcer admitted from assisted living facility on 10/19/2018 with reports of hematemesis status post EGD showing cratered gastric ulcers.  Weight remains fairly stable. Patient remains intubated and sedated. Her daughter was at bedside. She reports being informed that if platelet count is improved on 1/30, plan is for NGT placement by IR. Talked with daughter about tube feeding and what she may be able to expect.    Patient is currently intubated on ventilator support MV: 9.4 L/min Temp (24hrs), Avg:99.5 F (37.5 C), Min:99.2 F (37.3 C), Max:99.8 F (37.7 C) Propofol: 13.4 ml/hr (354 kcal) BP: 120/65 and MAP:  80   Medications reviewed; 40 mg IV lasix x2 doses 1/29, 4 mg IV Mg sulfate x1 run 1/29, 8 mg/hr protonix, 10 mEq IV KCl x8 runs 1/29 and x12 runs 1/28. Labs reviewed; Na: 150 mmol/l, K: 2.9 mmol/l, Cl: 114 mmol/l, Mg: 1.6 mg/dl, Ca: 8.1 mg/dl. IVF; D5 @ 50 ml/hr (204 kcal). Drip; propofol @ 30 mcg/kg/min.    Diet Order:   Diet Order            Diet NPO time specified  Diet effective now              EDUCATION NEEDS:   Education needs have been addressed  Skin:  Skin Assessment: Skin Integrity Issues: Skin Integrity Issues:: Stage III Stage III: sacrum  Last BM:  1/29  Height:   Ht Readings from Last 1 Encounters:  10/31/18 5\' 7"  (1.702 m)    Weight:   Wt Readings from Last 1 Encounters:  11/02/18 68.3 kg    Ideal Body Weight:  61.4 kg  BMI:  Body mass index is 23.58 kg/m.  Estimated Nutritional Needs:   Kcal:  1562 kcal  Protein:  100-110g  Fluid:  1.5L/day     Jarome Matin, MS, RD, LDN, CNSC Inpatient Clinical Dietitian Pager # [phone] After hours/weekend pager # [phone]

## 2018-11-02 NOTE — Progress Notes (Signed)
NAME:  Patricia Murillo, MRN:  [id number], DOB:  06/20/1946, LOS: 6 ADMISSION DATE:  10/06/2018, CONSULTATION DATE:  REFERRING MD:  Dr Wilson Singer, CHIEF COMPLAINT: Hematemesis  Brief History   Patient was brought into the hospital 1/23 for hematemesis this afternoon. She did decompensate overnight 10/28/2018 Aspirated and further decompensated,Intubated and on ventilator at present; PCCM asked to a/w care   Past Medical History  Arthritis, hiatal hernia, non-Hodgkin's lymphoma, hyperlipidemia.  Significant Hospital Events   Has remained edema dynamically stable 1/24-complaining of nausea vomiting, vomited a few times and aspirated, progressed to respiratory failure, required intubation.  Hemodynamic compromise following initial stabilization Significant amount of coffee grounds noted in endotracheal tube following intubation 1/25-weaned off pressors.  Awake, interactive, moving all extremities 1/25 through 1/29: Was extubated on the 26, was reintubated on 27th due to poor ventilatory mechanics and poor cough mechanics.  She had Candida tropicalis growing in her sputum and because she was immunocompromised we opted to start Diflucan as well.  On 1/28 we began to reduce sedation in hopes of readying her for weaning efforts.  Echocardiogram results show new reduced ejection fraction 1/29: Cycling on pressure support ventilation: Escalating diuresis, adding low-dose beta-blockade  Consults:  GI-post endoscopy significant for multiple ulcers-nonbleeding Repeat endoscopy on 10/12/2018-showed gastric edema, hemospray.  Large amounts of altered blood noted in the stomach  Procedures:  10/26/2018 right IJ placed 10/22/2018 endotracheal tube placed  Significant Diagnostic Tests:  Last CT chest was 09/07/2018 showing progression of lymphoma, multiple masses and extensive adenopathy CT abdomen 08/31/2018 also did reveal extensive adenopathy Chest x-ray on 10/21/2018-no new infiltrative process, large  hiatal hernia, tube appropriately positioned Chest x-ray post line placement noted-no pneumothorax, fluffy infiltrate on left TTE 1/27 >> normal LV size, systolic function mild to moderately reduced, EF 40 to 45% (decreased from 55 to 60% 08/31/2018)  Micro Data:  Respiratory 1/25 >> Candida tropicalis  Antimicrobials:  Zosyn 11/01/2018>> Fluconazole 1/27 >>   Interim history/subjective:  Tolerated short course of pressure support this morning still very weak complains of pain from her sacral ulcers Objective   Blood pressure 117/66, pulse (Abnormal) 105, temperature 99.5 F (37.5 C), temperature source Axillary, resp. rate (Abnormal) 30, height 5\' 7"  (1.702 m), weight 68.3 kg, SpO2 94 %. CVP:  [3 mmHg-6 mmHg] 4 mmHg  Vent Mode: PRVC FiO2 (%):  [30 %] 30 % Set Rate:  [16 bmp] 16 bmp Vt Set:  [490 mL] 490 mL PEEP:  [5 cmH20] 5 cmH20 Plateau Pressure:  [11 cmH20-22 cmH20] 11 cmH20   Intake/Output Summary (Last 24 hours) at 11/02/2018 0827 Last data filed at 11/02/2018 2993 Gross per 24 hour  Intake 1642.69 ml  Output 4500 ml  Net -2857.31 ml   Filed Weights   10/31/18 0500 11/01/18 0438 11/02/18 0301  Weight: 69.7 kg 69.7 kg 68.3 kg    Examination: General: This is a malnourished chronically ill-appearing 73 year old female who remains on full ventilatory support HEENT normocephalic atraumatic she does have temporal wasting orally intubated no JVD Pulmonary: Diminished bases no accessory use Cardiac: Regular rate and rhythm without murmur rub or gallop Abdomen: Soft nontender no organomegaly Extremities: Diffuse anasarca scattered areas of ecchymosis strong peripheral pulses are appreciated Neuro: Awake, interactive with family, follows commands, extremely weak GU: Clear yellow urine via Foley catheter Derm: Pressure ulcers on sacrum  Resolved Hospital Problem list   Hypocalcemia Shock, likely hemorrhagic plus component of septic  Assessment & Plan:  Acute hypoxemic  respiratory failure, secondary to aspiration  pneumonia and poor ventilatory and cough mechanics  Sputum growing Candida tropicalis requiring reintubation 1/27 Portable chest x-ray reviewed from 1/28 shows right greater than left airspace disease support tubes and lines in satisfactory position there is probably an element of right effusion. Plan Continuing pressure support ventilation as tolerated with daily assessment for spontaneous breathing trial IV diuresis as blood pressure BUN and creatinine will tolerate Day #5 of 7 Zosyn #2 fluconazole Scheduled bronchodilators Maximizing nutritional support Minimizing sedation, RASS goal 0 VAP bundle A.m. chest x-ray  Acute blood loss anemia due to upper GI bleed from bleeding gastric ulcers -Gastric biopsies without any evidence for lymphoma in the gastric lining -Hemoglobin stable Plan Continuing PPI twice daily Follow-up with GI  New left ventricular dysfunction by echocardiogram 1/27 ejection fraction is now 40 to 45%.  Consider due to chemotherapy, consider due to contribution of sepsis Plan Maximize diuresis Considering low-dose Coreg Continue telemetry  Severe protein calorie malnutrition, has a history of a hiatal hernia.   -We were unable to advance regular nasogastric tube, unfortunately she has been nothing by mouth for several days now. Plan  We will repeat CBC in a.m., platelets are now above 70.  Would like them to be closer to 100 given her risk of bleeding, tentatively planning on asking radiology to place gastric tube on 1/30  fluid and electrolyte imbalance: Hypernatremia, hypokalemia, Hypomagnesemia -Free water deficit is: Around 2400 cc Plan Replacing half free water deficit over the next 24 hours, will discontinue D5 half-normal and placed on D5 water at 50 cc an hour Replace potassium magnesium Follow-up a.m. chemistry  Hypoglycemia -Blood glucose remains in the 70s Plan Continuing D10 for now until we can  start tube feeds   B-cell lymphoma, received chemotherapy week prior to admission Plan Oncology following  Neutropenia due to chemotherapy Reviewed Dr. Fritz Pickerel notes, no role for repeat dosing G-CSF (given on 1/22) Plan Follow-up CBC  Sacral pressure ulcers Plan Wound ostomy following  History of third-degree heart block Has a permanent pacemaker Plan Continue telemetry    Best practice:  Diet: N.p.o. Pain/Anxiety/Delirium protocol (if indicated): Propofol, fentanyl VAP protocol (if indicated):  DVT prophylaxis: SCD, GI bleed GI prophylaxis: On Protonix drip Glucose control: ssi Mobility: Bedrest Code Status: Full code Family Communication: discussed with daughter at bedside 1/28 Disposition: She may remains critically ill due to respiratory failure requiring titration of PEEP and FiO2 with as well as assessment of volume status, correction of metabolic derangements, and aggressive diuresis and treatment of newly identified heart failure.  She does seem to have stabilized over the last 48 hours, however I am worried about how she will do over the long run given her poor nutritional stores.  For today plan will focus around IV diuresis, will add low-dose beta-blockade, replace her free water deficit, and continue supportive care.  I am hopeful her platelets will be above 100 tomorrow on 1/30 as we would like to get a nasogastric tube placed we will need radiology to assist [place] with this  My critical care time is 40 minutes the family was updated at bedside  Erick Colace ACNP-BC [place] Pager # [phone] OR # [phone] if no answer

## 2018-11-02 NOTE — Progress Notes (Signed)
[facility] ADULT ICU REPLACEMENT PROTOCOL FOR AM LAB REPLACEMENT ONLY  The patient does apply for the [facility] Adult ICU Electrolyte Replacment Protocol based on the criteria listed below:   1. Is GFR >/= 40 ml/min? Yes.    Patient's GFR today is >60 2. Is urine output >/= 0.5 ml/kg/hr for the last 6 hours? Yes.   Patient's UOP is 333 ml/kg/hr 3. Is BUN < 60 mg/dL? Yes.    Patient's BUN today is 12 4. Abnormal electrolyte(s): K- 2.9 5. Ordered repletion with: Potassium 29mEq q4hrs x 2 doses. If a panic level lab has been reported, has the CCM MD in charge been notified? Yes.  .   Physician:  Dr. Jack Quarto, [place] 11/02/2018 5:33 AM

## 2018-11-02 NOTE — Progress Notes (Signed)
[facility] PROGRESS NOTE Subjective Patient still intubated without signs of gross bleeding.  Feeding tube has not yet been placed.  Objective: Vital signs in last 24 hours: Temp:  [99.5 F (37.5 C)-99.8 F (37.7 C)] 99.5 F (37.5 C) (01/28 2320) Pulse Rate:  [88-106] 105 (01/29 0800) Resp:  [16-30] 30 (01/29 0800) BP: (105-145)/(55-78) 117/66 (01/29 0800) SpO2:  [94 %-99 %] 94 % (01/29 0800) FiO2 (%):  [30 %] 30 % (01/29 0742) Weight:  [68.3 kg] 68.3 kg (01/29 0301) Last BM Date: 11/02/18  Intake/Output from previous day: 01/28 0701 - 01/29 0700 In: 1681.5 [I.V.:922; IV Piggyback:759.6] Out: 4500 [Urine:4500] Intake/Output this shift: Total I/O In: 122.3 [I.V.:45.5; IV Piggyback:76.8] Out: -     Lab Results: Recent Labs    10/30/18 1558 10/31/18 0310  11/01/18 0436 11/01/18 0916 11/01/18 1624 11/02/18 0020 11/02/18 0318  WBC 0.7* 0.6*  --  0.2*  --   --   --  0.4*  HGB 10.2* 10.5*   < > 8.8* 8.1* 8.7* 8.8* 9.0*  HCT 31.2* 32.6*   < > 27.5* 25.0* 27.5* 28.0* 28.3*  PLT 21* 52*  --  46*  48*  --   --   --  69*   < > = values in this interval not displayed.   BMET Recent Labs    10/31/18 0310 10/31/18 2137 11/01/18 0436 11/01/18 1624 11/02/18 0318  NA 148* 150* 147* 149* 150*  K 2.3* 2.2* 2.9* 2.5* 2.9*  CL 115* 117* 115* 115* 114*  CO2 23 25 25 26 27   CREATININE 0.64 0.50 0.42* 0.48 0.53   LFT Recent Labs    10/31/18 0310 11/01/18 0436  PROT 5.5* 4.6*  AST 35 19  ALT 20 14  ALKPHOS 126 83  BILITOT 1.4* 1.5*   PT/INR Recent Labs    10/30/18 1558 11/01/18 0436  LABPROT 18.1* 16.8*  INR 1.51 1.38   PANCREAS No results for input(s): LIPASE in the last 72 hours.       Studies/Results: Dg Chest Port 1 View  Result Date: 11/01/2018 CLINICAL DATA:  Pulmonary edema EXAM: PORTABLE CHEST 1 VIEW COMPARISON:  Yesterday FINDINGS: Endotracheal tube tip below the clavicular heads. Dual-chamber pacer leads in stable position. Right  IJ central line with tip at the SVC. An orogastric tube has been removed. Haziness of the lower chest attributed to pleural fluid and presumed atelectasis. No evident pneumothorax. Stable cardiomegaly. Perihilar edema. IMPRESSION: Unchanged pleural effusions, pulmonary edema, and presumed atelectasis. Electronically Signed   By: Monte Fantasia M.D.   On: 11/01/2018 06:05   Dg Abd Portable 1v  Result Date: 10/31/2018 CLINICAL DATA:  Feeding tube placement EXAM: PORTABLE ABDOMEN - 1 VIEW COMPARISON:  Portable exam 1135 hours compared to 0741 hours Correlation: CT abdomen and pelvis 08/31/2018 FINDINGS: Tip of feeding tube projects over the expected position of the LEFT diaphragm; patient has a large hiatal hernia, feeding tube likely within stomach in the inferior chest. Pacemaker leads unchanged. Enlargement of cardiac silhouette. Atelectasis versus consolidation LEFT lower lobe. Visualized bowel gas pattern normal. IMPRESSION: Tip of feeding tube projects over the LEFT diaphragm in a patient with a known large hiatal hernia,; suspect feeding tube is within the hiatal hernia. Electronically Signed   By: Lavonia Dana M.D.   On: 10/31/2018 12:24    Medications: I have reviewed the patient's current medications.  Assessment:   1.  Multiple gastric ulcers.  Fortunately pathology benign for lymphoma 2.  Respiratory failure continued on  ventilator 3.  Nutrition.  Feeding tube was unable to be placed due to large hiatal hernia will likely need to be placed by IR under fluoroscopy   Plan: Would suggest holding off on feeding tube until her bone marrow is adequately recovered and then consider placement by IR.  If patient survives all this in approximately 2 months she will need repeat EGD to document healing of these ulcers. We will sign off but please call us if we can be of any further help.   Nancy Fetter 11/02/2018, 8:47 AM  This note was created using voice recognition software. Minor errors  may Have occurred unintentionally.  Pager: [phone] If no answer or after hours call [phone]

## 2018-11-02 NOTE — Progress Notes (Signed)
CRITICAL VALUE ALERT  Critical Value:  Potassium 2.5  Date & Time Notied:  2009 11/02/18  Provider Notified: E-link  Orders Received/Actions taken: awaiting orders

## 2018-11-03 ENCOUNTER — Inpatient Hospital Stay (HOSPITAL_COMMUNITY): Payer: Medicare HMO

## 2018-11-03 LAB — HEMOGLOBIN AND HEMATOCRIT, BLOOD
HCT: 31 % — ABNORMAL LOW (ref 36.0–46.0)
Hemoglobin: 9.8 g/dL — ABNORMAL LOW (ref 12.0–15.0)

## 2018-11-03 LAB — BASIC METABOLIC PANEL
Anion gap: 12 (ref 5–15)
Anion gap: 8 (ref 5–15)
BUN: 12 mg/dL (ref 8–23)
BUN: 13 mg/dL (ref 8–23)
CHLORIDE: 111 mmol/L (ref 98–111)
CO2: 26 mmol/L (ref 22–32)
CO2: 29 mmol/L (ref 22–32)
Calcium: 7.5 mg/dL — ABNORMAL LOW (ref 8.9–10.3)
Calcium: 7 mg/dL — ABNORMAL LOW (ref 8.9–10.3)
Chloride: 108 mmol/L (ref 98–111)
Creatinine, Ser: 0.54 mg/dL (ref 0.44–1.00)
Creatinine, Ser: 0.51 mg/dL (ref 0.44–1.00)
GFR calc Af Amer: >60 mL/min (ref >60)
GFR calc Af Amer: >60 mL/min (ref >60)
GFR calc non Af Amer: >60 mL/min (ref >60)
GFR calc non Af Amer: >60 mL/min (ref >60)
GLUCOSE: 95 mg/dL (ref 70–99)
Glucose, Bld: 89 mg/dL (ref 70–99)
POTASSIUM: 3.2 mmol/L — AB (ref 3.5–5.1)
Potassium: 2.8 mmol/L — ABNORMAL LOW (ref 3.5–5.1)
SODIUM: 145 mmol/L (ref 135–145)
Sodium: 149 mmol/L — ABNORMAL HIGH (ref 135–145)

## 2018-11-03 LAB — TRIGLYCERIDES: Triglycerides: 238 mg/dL — ABNORMAL HIGH (ref <150)

## 2018-11-03 LAB — CBC
HCT: 31.9 % — ABNORMAL LOW (ref 36.0–46.0)
Hemoglobin: 9.9 g/dL — ABNORMAL LOW (ref 12.0–15.0)
MCH: 28 pg (ref 26.0–34.0)
MCHC: 31 g/dL (ref 30.0–36.0)
MCV: 90.1 fL (ref 80.0–100.0)
Platelets: 137 10*3/uL — ABNORMAL LOW (ref 150–400)
RBC: 3.54 MIL/uL — AB (ref 3.87–5.11)
RDW: 16.9 % — ABNORMAL HIGH (ref 11.5–15.5)
WBC: 3.7 10*3/uL — ABNORMAL LOW (ref 4.0–10.5)
nRBC: 0 % (ref 0.0–0.2)

## 2018-11-03 LAB — GLUCOSE, CAPILLARY
GLUCOSE-CAPILLARY: 88 mg/dL (ref 70–99)
GLUCOSE-CAPILLARY: 91 mg/dL (ref 70–99)
Glucose-Capillary: 90 mg/dL (ref 70–99)
Glucose-Capillary: 79 mg/dL (ref 70–99)
Glucose-Capillary: 90 mg/dL (ref 70–99)
Glucose-Capillary: 80 mg/dL (ref 70–99)

## 2018-11-03 MED ORDER — POTASSIUM CHLORIDE 10 MEQ/50ML IV SOLN
10.0000 meq | INTRAVENOUS | Status: AC
  Administered 2018-11-03 (×6): 10 meq via INTRAVENOUS
  Filled 2018-11-03 (×6): qty 50

## 2018-11-03 MED ORDER — FUROSEMIDE 10 MG/ML IJ SOLN
40.0000 mg | Freq: Two times a day (BID) | INTRAMUSCULAR | Status: AC
  Administered 2018-11-03 – 2018-11-04 (×2): 40 mg via INTRAVENOUS
  Filled 2018-11-03 (×2): qty 4

## 2018-11-03 MED ORDER — POTASSIUM CHLORIDE 10 MEQ/50ML IV SOLN
10.0000 meq | INTRAVENOUS | Status: DC
  Administered 2018-11-03 (×2): 10 meq via INTRAVENOUS
  Filled 2018-11-03 (×2): qty 50

## 2018-11-03 MED ORDER — POTASSIUM CHLORIDE 10 MEQ/50ML IV SOLN
10.0000 meq | INTRAVENOUS | Status: AC
  Administered 2018-11-03 – 2018-11-04 (×6): 10 meq via INTRAVENOUS
  Filled 2018-11-03 (×6): qty 50

## 2018-11-03 MED ORDER — POTASSIUM CHLORIDE 20 MEQ/15ML (10%) PO SOLN: 40.0000 meq | ORAL | Status: DC

## 2018-11-03 MED ORDER — VITAL AF 1.2 CAL PO LIQD: 1000.0000 mL | ORAL | Status: DC

## 2018-11-03 MED ORDER — IOPAMIDOL (ISOVUE-300) INJECTION 61%
INTRAVENOUS | Status: AC
  Administered 2018-11-03: 50 mL
  Filled 2018-11-03: qty 50

## 2018-11-03 MED ORDER — IOPAMIDOL (ISOVUE-300) INJECTION 61%: 50.0000 mL | Freq: Once | INTRAVENOUS | Status: DC | PRN

## 2018-11-03 NOTE — Progress Notes (Signed)
Notified E-link that pt's potassium resulted at 2.8. Awaiting further orders

## 2018-11-03 NOTE — Progress Notes (Signed)
Patricia Murillo   DOB:08-20-1946   RU#:[id number]   BJY#:[id number]  Hematology and oncology follow-up note  Subjective: Pt remained to be intubated, sedated, tolerating ventilator weaning. Her cytopenias has much improved today.  Hemoglobin stable, has not required more blood transfusion.   Objective:  Vitals:   11/03/18 1700 11/03/18 1800  BP: (!) 101/45 118/85  Pulse: 89 91  Resp: 20 (!) 21  Temp:    SpO2: 95% 94%    Body mass index is 23.34 kg/m.  Intake/Output Summary (Last 24 hours) at 11/03/2018 1942 Last data filed at 11/03/2018 1843 Gross per 24 hour  Intake 2311.13 ml  Output 1850 ml  Net 461.13 ml     Pt intubated and sedated   Lungs clear -- no rales or rhonchi  Heart regular rate and rhythm  Abdomensoft  Skin: Diffuse ecchymosis on bilateral arms   CBG (last 3)  Recent Labs    11/03/18 1131 11/03/18 1605 11/03/18 1926  GLUCAP 90 80 91     Labs:  Lab Results  Component Value Date   WBC 3.7 (L) 11/03/2018   HGB 9.9 (L) 11/03/2018   HCT 31.9 (L) 11/03/2018   MCV 90.1 11/03/2018   PLT 137 (L) 11/03/2018   NEUTROABS 1.1 (L) 11/04/2018   CMP Latest Ref Rng & Units 11/03/2018 11/02/2018 11/02/2018  Glucose 70 - 99 mg/dL 89 83 86  BUN 8 - 23 mg/dL 12 11 12   Creatinine 0.44 - 1.00 mg/dL 0.54 0.46 0.53  Sodium 135 - 145 mmol/L 149(H) 147(H) 150(H)  Potassium 3.5 - 5.1 mmol/L 3.2(L) 2.5(LL) 2.9(L)  Chloride 98 - 111 mmol/L 111 110 114(H)  CO2 22 - 32 mmol/L 26 26 27   Calcium 8.9 - 10.3 mg/dL 7.5(L) 7.7(L) 8.1(L)  Total Protein 6.5 - 8.1 g/dL - - -  Total Bilirubin 0.3 - 1.2 mg/dL - - -  Alkaline Phos 38 - 126 U/L - - -  AST 15 - 41 U/L - - -  ALT 0 - 44 U/L - - -     Urine Studies No results for input(s): UHGB, CRYS in the last 72 hours.  Invalid input(s): UACOL, UAPR, USPG, UPH, UTP, UGL, UKET, UBIL, UNIT, UROB, ULEU, UEPI, UWBC, Junie Panning Williams, [place], [place]  Basic Metabolic Panel: Recent Labs  Lab 10/18/2018 0855  10/31/18 1049  10/31/18 1850  11/01/18 0436 11/01/18 1624 11/02/18 0318 11/02/18 1915 11/02/18 2033 11/03/18 0430  NA 138   < >  --   --    < > 147* 149* 150* 147*  --  149*  K 3.8   < >  --   --    < > 2.9* 2.5* 2.9* 2.5*  --  3.2*  CL 114*   < >  --   --    < > 115* 115* 114* 110  --  111  CO2 20*   < >  --   --    < > 25 26 27 26   --  26  GLUCOSE 111*   < >  --   --    < > 87 89 86 83  --  89  BUN 20   < >  --   --    < > 11 10 12 11   --  12  CREATININE 0.36*   < >  --   --    < > 0.42* 0.48 0.53 0.46  --  0.54  CALCIUM 7.4*   < >  --   --    < >  8.9 8.4* 8.1* 7.7*  --  7.5*  MG 2.3   < > 1.5* 1.4*  --  2.0 1.5* 1.6*  --  2.1  --   PHOS 2.2*  --  2.2* 2.0*  --  2.6 2.3*  --   --   --   --    < > = values in this interval not displayed.   GFR Estimated Creatinine Clearance: 62.7 mL/min (by C-G formula based on SCr of 0.54 mg/dL). Liver Function Tests: Recent Labs  Lab 10/31/18 0310 11/01/18 0436  AST 35 19  ALT 20 14  ALKPHOS 126 83  BILITOT 1.4* 1.5*  PROT 5.5* 4.6*  ALBUMIN 2.7* 2.2*   No results for input(s): LIPASE, AMYLASE in the last 168 hours. No results for input(s): AMMONIA in the last 168 hours. Coagulation profile Recent Labs  Lab 10/30/18 1558 11/01/18 0436  INR 1.51 1.38    CBC: Recent Labs  Lab 10/31/2018 0855 10/28/2018 0635  10/11/2018 2044  10/30/18 1558 10/31/18 0310  11/01/18 0436  11/02/18 0318 11/02/18 0916 11/02/18 1716 11/03/18 0100 11/03/18 0820  WBC 26.3* 18.5*   < > 2.3*  --  0.7* 0.6*  --  0.2*  --  0.4*  --   --   --  3.7*  NEUTROABS 23.1* 14.2*  --  1.1*  --   --   --   --   --   --   --   --   --   --   --   HGB 7.9* 8.0*   < > 6.1*   < > 10.2* 10.5*   < > 8.8*   < > 9.0* 9.3* 8.9* 9.8* 9.9*  HCT 24.8* 26.7*   < > 20.3*   < > 31.2* 32.6*   < > 27.5*   < > 28.3* 29.3* 28.5* 31.0* 31.9*  MCV 92.2 98.2   < > 96.7  --  87.9 88.3  --  89.9  --  90.1  --   --   --  90.1  PLT 151 205   < > 35*  --  21* 52*  --  46*  48*  --  69*  --   --   --  137*    < > = values in this interval not displayed.   Cardiac Enzymes: No results for input(s): CKTOTAL, CKMB, CKMBINDEX, TROPONINI in the last 168 hours. BNP: Invalid input(s): POCBNP CBG: Recent Labs  Lab 11/03/18 0354 11/03/18 0801 11/03/18 1131 11/03/18 1605 11/03/18 1926  GLUCAP 90 79 90 80 91   D-Dimer Recent Labs    11/01/18 0436  DDIMER 4.75*   Hgb A1c No results for input(s): HGBA1C in the last 72 hours. Lipid Profile Recent Labs    11/01/18 0435 11/03/18 0431  TRIG 182* 238*   Thyroid function studies No results for input(s): TSH, T4TOTAL, T3FREE, THYROIDAB in the last 72 hours.  Invalid input(s): FREET3 Anemia work up Recent Labs    11/01/18 0436  FERRITIN 2,066*  TIBC 138*  IRON 13*  RETICCTPCT 0.3*   Microbiology Recent Results (from the past 240 hour(s))  MRSA PCR Screening     Status: None   Collection Time: 10/26/2018  6:19 PM  Result Value Ref Range Status   MRSA by PCR NEGATIVE NEGATIVE Final    Comment:        The GeneXpert MRSA Assay (FDA approved for NASAL specimens only), is one component of a comprehensive MRSA colonization surveillance program.  It is not intended to diagnose MRSA infection nor to guide or monitor treatment for MRSA infections. Performed at [facility], [place] [address] [place], [place] [address]   Culture, respiratory (non-expectorated)     Status: None   Collection Time: 10/28/2018  9:03 AM  Result Value Ref Range Status   Specimen Description   Final    TRACHEAL ASPIRATE Performed at [place] [address] [place], [place] [address]    Special Requests   Final    NONE Performed at [facility], [place] [address] [place], [place] [address]    Gram Stain   Final    NO WBC SEEN NO SQUAMOUS EPITHELIAL CELLS SEEN MODERATE BUDDING YEAST SEEN RARE GRAM POSITIVE RODS RARE GRAM NEGATIVE RODS Performed at [facility], [address],  [place], [place] [address]    Culture MODERATE CANDIDA TROPICALIS  Final   Report Status 10/31/2018 FINAL  Final      Studies:  Dg Chest Port 1 View  Result Date: 11/03/2018 CLINICAL DATA:  Pulmonary edema. EXAM: PORTABLE CHEST 1 VIEW COMPARISON:  11/01/2018 FINDINGS: Endotracheal tube terminates 3.7 cm above the carina. Right jugular catheter terminates over the lower SVC. Pacemaker remains in place. The cardiac silhouette remains enlarged. Perihilar opacities have improved. There is also improved aeration of the right lung base. There is persistent opacity in the left lung base which may reflect a small pleural effusion with atelectasis or consolidation. Minimal hazy opacity in the right lung base may reflect a small residual pleural effusion as well. No pneumothorax is identified. IMPRESSION: Improved lung aeration with decreased pulmonary edema. Persistent small left pleural effusion with left basilar atelectasis or consolidation. Electronically Signed   By: Logan Bores M.D.   On: 11/03/2018 06:44   Dg Abd Portable 1v  Result Date: 11/03/2018 CLINICAL DATA:  Nasogastric tube placement EXAM: PORTABLE ABDOMEN - 1 VIEW COMPARISON:  Portable exam 1059 hours compared to 10/31/2018 FINDINGS: Tip of feeding tube projects over the inferior LEFT mediastinum, likely within hiatal hernia. Decreased LEFT basilar atelectasis versus consolidation with persistent pleural effusion. Bowel gas pattern normal. RIGHT lung base grossly clear. Bones demineralized. IMPRESSION: Tip of feeding tube projects over hiatal hernia, above diaphragm. Electronically Signed   By: Lavonia Dana M.D.   On: 11/03/2018 11:34   Dg Intro Long Gi Tube  Result Date: 11/03/2018 CLINICAL DATA:  Attempt to advance feeding tube into duodenum. EXAM: INTRO LONG GI TUBE CONTRAST:  42mL ISOVUE-300 IOPAMIDOL (ISOVUE-300) INJECTION 61% FLUOROSCOPY TIME:  Fluoroscopy Time:  2 minutes and 35 seconds Radiation Exposure Index (if provided by the  fluoroscopic device): Not applicable. Number of Acquired Spot Images: 1 COMPARISON:  [place] [address]-09-618. FINDINGS: Attempt was made to advance the feeding tube from the hiatal hernia into the subdiaphragmatic stomach. Small amount of contrast was administered (50 cc of Isovue 300). The contrast did not pass into the subdiaphragmatic stomach. Despite extensive manipulation, the feeding tube could not be advanced into the subdiaphragmatic stomach. It was left within the hiatal hernia. IMPRESSION: Unsuccessful advancement of the feeding tube into the subdiaphragmatic stomach. Electronically Signed   By: Abigail Miyamoto M.D.   On: 11/03/2018 16:25    Assessment: 73 y.o. with recently diagnosed non-Hodgkin diffuse large B-cell lymphoma, status post chemotherapy on 10/24/2018, sacral decubitus, complete heart block status post pacemaker, admitted for upper GI bleeding  1.  Upper GI bleeding secondary to multiple gastric ulcers 2.  Severe  pancytopenia, secondary to chemo, much improved  3.  Diffuse large B-cell lymphoma, stage IV, status post 3 cycles of chemotherapy R-CHOP, last cycle on October 24, 2018 4.  Aspiration pneumonia with respiratory failure, intubated 5.  Sacral decubitus 6.  Complete heart block status post pacemaker   Plan:  -lab reviewed, white count improved to 3.7 today, hemoglobin overall stable and improved, 9.9 this morning, counts also improved to 137K.  Her blood counts are recovering well from chemotherapy -hope she could be extubated in a few days  -ICU team is trying to place a NG tube for feeding  -I will f/u as needed. Appreciate ICU and GI team -I spoke with her daughter at bedside today. Will postpone her next cycle chemo, plan to repeat PET when she recovers from this acute illness.    Truitt Merle, MD 11/03/2018

## 2018-11-03 NOTE — Progress Notes (Signed)
NAME:  Patricia Murillo, MRN:  [id number], DOB:  1946/03/20, LOS: 7 ADMISSION DATE:  10/25/2018, CONSULTATION DATE:  REFERRING MD:  Dr Wilson Singer, CHIEF COMPLAINT: Hematemesis  Brief History   Patient was brought into the hospital 1/23 for hematemesis this afternoon. She did decompensate overnight 10/21/2018 Aspirated and further decompensated,Intubated and on ventilator at present; PCCM asked to a/w care   Past Medical History  Arthritis, hiatal hernia, non-Hodgkin's lymphoma, hyperlipidemia.  Significant Hospital Events   Has remained edema dynamically stable 1/24-complaining of nausea vomiting, vomited a few times and aspirated, progressed to respiratory failure, required intubation.  Hemodynamic compromise following initial stabilization Significant amount of coffee grounds noted in endotracheal tube following intubation 1/25-weaned off pressors.  Awake, interactive, moving all extremities 1/25 through 1/29: Was extubated on the 26, was reintubated on 27th due to poor ventilatory mechanics and poor cough mechanics.  She had Candida tropicalis growing in her sputum and because she was immunocompromised we opted to start Diflucan as well.  On 1/28 we began to reduce sedation in hopes of readying her for weaning efforts.  Echocardiogram results show new reduced ejection fraction 1/29: Cycling on pressure support ventilation: Escalating diuresis, adding low-dose beta-blockade  1/30: Tolerating pressure support at 10 cm water, still with accessory use.  Titrating up beta-blockade.  Continuing diuresis.  Platelets now greater than 100 so GI consult for feeding tube Consults:  GI-post endoscopy significant for multiple ulcers-nonbleeding Repeat endoscopy on 10/14/2018-showed gastric edema, hemospray.  Large amounts of altered blood noted in the stomach  Procedures:  10/12/2018 right IJ placed 10/17/2018 endotracheal tube placed  Significant Diagnostic Tests:  Last CT chest was 09/07/2018 showing  progression of lymphoma, multiple masses and extensive adenopathy CT abdomen 08/31/2018 also did reveal extensive adenopathy Chest x-ray on 10/06/2018-no new infiltrative process, large hiatal hernia, tube appropriately positioned Chest x-ray post line placement noted-no pneumothorax, fluffy infiltrate on left TTE 1/27 >> normal LV size, systolic function mild to moderately reduced, EF 40 to 45% (decreased from 55 to 60% 08/31/2018)  Micro Data:  Respiratory 1/25 >> Candida tropicalis  Antimicrobials:  Zosyn 10/19/2018>> Fluconazole 1/27 >>   Interim history/subjective:  Little stronger today, still very weak, not ready for extubation Objective   Blood pressure 119/70, pulse (Abnormal) 106, temperature 99.2 F (37.3 C), temperature source Axillary, resp. rate (Abnormal) 23, height 5\' 7"  (1.702 m), weight 67.6 kg, SpO2 96 %. CVP:  [4 mmHg-6 mmHg] 6 mmHg  Vent Mode: PSV;CPAP FiO2 (%):  [30 %] 30 % Set Rate:  [16 bmp] 16 bmp Vt Set:  [490 mL] 490 mL PEEP:  [5 cmH20] 5 cmH20 Plateau Pressure:  [11 cmH20-22 cmH20] 22 cmH20   Intake/Output Summary (Last 24 hours) at 11/03/2018 0905 Last data filed at 11/03/2018 0403 Gross per 24 hour  Intake 2030.75 ml  Output 1700 ml  Net 330.75 ml   Filed Weights   11/01/18 0438 11/02/18 0301 11/03/18 0357  Weight: 69.7 kg 68.3 kg 67.6 kg    Examination: General: This is a severely deconditioned and malnourished 73 year old female she is currently resting on pressure support ventilation but does exhibit a fairly high minute ventilation HEENT temporal wasting and alopecia noted.  Mucous membranes moist orally intubated Pulmonary: Diminished throughout accessory muscle use exacerbated when pressure support titrated down to 5 cmH2O Cardiac: Regular rate and rhythm tachycardic Abdomen: Soft nontender Extremities: Diffuse anasarca scattered areas of ecchymosis weeping upper extremities Neuro: Awake, oriented, generalized weakness with commands. GU:  Clear yellow  Resolved Hospital  Problem list   Hypocalcemia Shock, likely hemorrhagic plus component of septic  Assessment & Plan:  Acute hypoxemic respiratory failure, secondary to aspiration pneumonia and poor ventilatory and cough mechanics  Sputum growing Candida tropicalis requiring reintubation 1/27 Her portable chest x-ray was personally reviewed this demonstrates continued daily improvement in aeration can clearly see diaphragm today this is a significant improvement in compared to prior exams -Continued diuresis, still with significant positive fluid balance but making progress -Still has significantly high minute ventilation and paradoxical efforts even on pressure support of 10 Plan We will continue daily diuresis as mentioned below  Day #6 of 7 Zosyn  Day #3 fluconazole  PAD protocol RASS goal 0  VAP bundle Get her out of bed Maximize nutrition Continue daily spontaneous breathing trial as well as pressure support as tolerated I am worried her degree of deconditioning and malnutrition will play a significant role in success going forward   Acute blood loss anemia due to upper GI bleed from bleeding gastric ulcers -Gastric biopsies without any evidence for lymphoma in the gastric lining -Hemoglobin stable Plan PPI BID F/u w/ GI out-pt   New left ventricular dysfunction by echocardiogram 1/27 ejection fraction is now 40 to 45%.  Consider due to chemotherapy, consider due to contribution of sepsis Plan Cont Diuresis (as long as BP/BUN/Cr tolerate) Inc BB; eventually transition to Coreg when can give Via tube  Cont tele   Severe protein calorie malnutrition, has a history of a hiatal hernia.   -We were unable to advance regular nasogastric tube, unfortunately she has been nothing by mouth for several days now. Plan  Ask radiology to place NGT   fluid and electrolyte imbalance: Hypernatremia, hypokalemia, Hypomagnesemia -Free water deficit is: still ~2.5 l (likely  exacerbated by diuresis) Plan Inc D5 w to 60cc/hr Replace K Ck Mg on next lab draw Am chem  Hypoglycemia -Blood glucose remains in the 70s Plan D10 till tubefeeds started    B-cell lymphoma, received chemotherapy week prior to admission Plan onc following remotely   Neutropenia due to chemotherapy Reviewed Dr. Fritz Pickerel notes, no role for repeat dosing G-CSF (given on 1/22) Plan F/u cbc G-CSF per onc (given 1/22)  Sacral pressure ulcers Plan WON following   History of third-degree heart block Has a permanent pacemaker Plan Cont tele    Best practice:  Diet: N.p.o. Pain/Anxiety/Delirium protocol (if indicated): Propofol, fentanyl VAP protocol (if indicated):  DVT prophylaxis: SCD, GI bleed GI prophylaxis: On Protonix drip Glucose control: ssi Mobility: Bedrest Code Status: Full code Family Communication: discussed with daughter at bedside 1/28 Disposition: She may remains critically ill due to respiratory failure requiring titration of PEEP and FiO2 with as well as assessment of volume status, correction of metabolic derangements, and aggressive diuresis and treatment of newly identified heart failure.  She does seem to have stabilized over the last 48 hours, however I am worried about how she will do over the long run given her poor nutritional stores.  For today plan will focus around IV diuresis, will add low-dose beta-blockade, replace her free water deficit, and continue supportive care.  I am hopeful her platelets will be above 100 tomorrow on 1/30 as we would like to get a nasogastric tube placed we will need radiology to assist [place] with this  My critical care time is 40 minutes the family was updated at bedside  Erick Colace ACNP-BC [place] Pager # [phone] OR # [phone] if no answer

## 2018-11-03 NOTE — Consult Note (Signed)
[place] Nurse wound consult note Reason for Consult:Stage 4 pressure injury Wound type:pressure Pressure Injury POA: Yes Measurement: 4.0cm x 3.2cm x 2.5cm with coccyx bone in center evident Wound bed: tissue depth surrounding coccyx bone is nonviable, dry Drainage (amount, consistency, odor) small amount serous to light yellow exudate Periwound:0.5cm purple/mnaroon tissue consistent with deep tissue pressure injury. Dressing procedure/placement/frequency: I feel that this is a non healable pressure injury in that the likelihood of osteomyelitis is great due to exposed bone and frequent contamination with fecal incontinence, the protein/nutritional need for tissue regeneration is great and the options for tissue regeneration few given the underlying disease state. If things stabilize, patient might pursue consultation with plastic surgery for other options aimed at closure including removal of the coccyx bone. Her daughter and I have discussed the completing priorities of HOB elevation and skin breakdown and note that side to side positioning is essential. Of course, comfort is important and the patient may require pain medication prior to dressing changes. Today we will provide pressure redistribution heel boots to prevent heel pressure injury.  [place] [facility] nursing team will not follow, but will remain available to this patient, the nursing and medical teams.  Please re-consult if needed. Thanks, Maudie Flakes, MSN, RN, [place], [facility]  Pager# [phone]

## 2018-11-03 NOTE — Progress Notes (Signed)
Nutrition Follow-up  DOCUMENTATION CODES:   Severe malnutrition in context of chronic illness  INTERVENTION:  - Will adjust TF order: Vital AF 1.2 @ 20 ml/hr to advance by 10 ml every 12 hours to reach goal rate of 60 ml/hr. - Goal rate will provide 1728 kcal (101% estimated need), 108 grams of protein, and 1168 ml free water. - Free water flush, if desired, to be per MD/NP.  Monitor magnesium, potassium, and phosphorus daily for at least 3 days, MD to replete as needed, as pt is at risk for refeeding syndrome given severe malnutrition, no nutrition x1 week, poor intakes x1 month PTA, current hypokalemia.    NUTRITION DIAGNOSIS:   Severe Malnutrition related to chronic illness, cancer and cancer related treatments as evidenced by percent weight loss, energy intake < or equal to 75% for > or equal to 1 month, moderate fat depletion, severe muscle depletion. -ongoing  GOAL:   Patient will meet greater than or equal to 90% of their needs -unmet/unable to meet at this time.   MONITOR:   Vent status, TF tolerance, Weight trends, Labs, Skin  ASSESSMENT:   73 year old with past medical history relevant for hypertension, pain, non-Hodgkin's lymphoma status post chemotherapy 10/24/2018 with R-CHOP, status post permanent pacemaker, sacral decubitus ulcer admitted from assisted living facility on 10/07/2018 with reports of hematemesis status post EGD showing cratered gastric ulcers.  Patient remains intubated, sedation now off. Estimated nutrition needs update and TF order updated as outlined above.   Per [name]'s note this AM: plan for ongoing diuresis d/t positive fluid balance, daily SBTs, plan for IR consult for small bore NGT placement today, B-cell lymphoma and received chemo the week PTA, neutropenia d/t chemo.   Patient is currently intubated on ventilator support MV: 13.2 L/min Temp (24hrs), Avg:99.4 F (37.4 C), Min:99 F (37.2 C), Max:100.2 F (37.9 C) Propofol: none BP: 134/78  and MAP: 96  Medications reviewed; 40 mg IV lasix x2 doses 1/29, 4 g IV Mg sulfate x1 dose 1/29, 8 mg/hr protonix, 10 mEq IV CKl x6 runs 1/29 and x6 runs 1/30. Labs reviewed; Na: 149 mmol/l, K: 3.2 mmol/l, Ca: 7.5 mg/dl. IVF; D5 @ 60 ml/hr (245 kcal).    Diet Order:   Diet Order            Diet NPO time specified  Diet effective now              EDUCATION NEEDS:   Education needs have been addressed  Skin:  Skin Assessment: Skin Integrity Issues: Skin Integrity Issues:: Stage III Stage III: sacrum  Last BM:  1/30  Height:   Ht Readings from Last 1 Encounters:  10/31/18 5\' 7"  (1.702 m)    Weight:   Wt Readings from Last 1 Encounters:  11/03/18 67.6 kg    Ideal Body Weight:  61.4 kg  BMI:  Body mass index is 23.34 kg/m.  Estimated Nutritional Needs:   Kcal:  1706 kcal  Protein:  100-110g  Fluid:  1.5L/day     Patricia Matin, MS, RD, LDN, CNSC Inpatient Clinical Dietitian Pager # [phone] After hours/weekend pager # [phone]

## 2018-11-03 NOTE — Progress Notes (Signed)
Still unable to place NG tube, regardless of fluro procedure. Per Salvadore Dom NP, will D/c tube feeds and try to find another solution.

## 2018-11-03 NOTE — Progress Notes (Signed)
[facility] ADULT ICU REPLACEMENT PROTOCOL FOR AM LAB REPLACEMENT ONLY  The patient does apply for the [facility] Adult ICU Electrolyte Replacment Protocol based on the criteria listed below:   1. Is GFR >/= 40 ml/min? Yes.    Patient's GFR today is >60 2. Is urine output >/= 0.5 ml/kg/hr for the last 6 hours? Yes.   Patient's UOP is 4.19 ml/kg/hr 3. Is BUN < 60 mg/dL? Yes.    Patient's BUN today is 12 4. Abnormal electrolyte K 3.2 5. Ordered repletion with: per protocol 6. If a panic level lab has been reported, has the CCM MD in charge been notified? Yes.  .   Physician:  E Deterding  Christeen Douglas 11/03/2018 6:08 AM

## 2018-11-04 ENCOUNTER — Inpatient Hospital Stay (HOSPITAL_COMMUNITY): Payer: Medicare HMO

## 2018-11-04 ENCOUNTER — Other Ambulatory Visit: Payer: Medicare HMO

## 2018-11-04 ENCOUNTER — Ambulatory Visit: Payer: Medicare HMO | Admitting: Hematology

## 2018-11-04 ENCOUNTER — Inpatient Hospital Stay: Payer: Self-pay

## 2018-11-04 LAB — BASIC METABOLIC PANEL
Anion gap: 8 (ref 5–15)
Anion gap: 11 (ref 5–15)
BUN: 13 mg/dL (ref 8–23)
BUN: 12 mg/dL (ref 8–23)
CALCIUM: 7 mg/dL — AB (ref 8.9–10.3)
CHLORIDE: 112 mmol/L — AB (ref 98–111)
CO2: 27 mmol/L (ref 22–32)
CO2: 30 mmol/L (ref 22–32)
Calcium: 6.9 mg/dL — ABNORMAL LOW (ref 8.9–10.3)
Chloride: 104 mmol/L (ref 98–111)
Creatinine, Ser: 0.47 mg/dL (ref 0.44–1.00)
Creatinine, Ser: 0.42 mg/dL — ABNORMAL LOW (ref 0.44–1.00)
GFR calc Af Amer: >60 mL/min (ref >60)
GFR calc Af Amer: >60 mL/min (ref >60)
GFR calc non Af Amer: >60 mL/min (ref >60)
GFR calc non Af Amer: >60 mL/min (ref >60)
Glucose, Bld: 79 mg/dL (ref 70–99)
Glucose, Bld: 133 mg/dL — ABNORMAL HIGH (ref 70–99)
POTASSIUM: 3.8 mmol/L (ref 3.5–5.1)
Potassium: 2.7 mmol/L — CL (ref 3.5–5.1)
SODIUM: 145 mmol/L (ref 135–145)
Sodium: 147 mmol/L — ABNORMAL HIGH (ref 135–145)

## 2018-11-04 LAB — GLUCOSE, CAPILLARY
Glucose-Capillary: 78 mg/dL (ref 70–99)
Glucose-Capillary: 82 mg/dL (ref 70–99)
Glucose-Capillary: 83 mg/dL (ref 70–99)
Glucose-Capillary: 112 mg/dL — ABNORMAL HIGH (ref 70–99)
Glucose-Capillary: 133 mg/dL — ABNORMAL HIGH (ref 70–99)

## 2018-11-04 LAB — PHOSPHORUS: Phosphorus: 1.7 mg/dL — ABNORMAL LOW (ref 2.5–4.6)

## 2018-11-04 LAB — MAGNESIUM: Magnesium: 1.8 mg/dL (ref 1.7–2.4)

## 2018-11-04 LAB — TRIGLYCERIDES: Triglycerides: 235 mg/dL — ABNORMAL HIGH (ref <150)

## 2018-11-04 MED ORDER — CHLORHEXIDINE GLUCONATE 0.12 % MT SOLN
15.0000 mL | Freq: Two times a day (BID) | OROMUCOSAL | Status: DC
  Administered 2018-11-04 – 2018-11-07 (×6): 15 mL via OROMUCOSAL
  Filled 2018-11-04 (×2): qty 15

## 2018-11-04 MED ORDER — TRAVASOL 10 % IV SOLN
INTRAVENOUS | Status: AC
  Administered 2018-11-04: 18:00:00 via INTRAVENOUS
  Filled 2018-11-04: qty 512.4

## 2018-11-04 MED ORDER — INSULIN ASPART 100 UNIT/ML ~~LOC~~ SOLN: 0.0000 [IU] | SUBCUTANEOUS | Status: DC

## 2018-11-04 MED ORDER — ACETAMINOPHEN 10 MG/ML IV SOLN
1000.0000 mg | Freq: Four times a day (QID) | INTRAVENOUS | Status: AC
  Administered 2018-11-04 – 2018-11-05 (×4): 1000 mg via INTRAVENOUS
  Filled 2018-11-04 (×5): qty 100

## 2018-11-04 MED ORDER — MAGNESIUM SULFATE 2 GM/50ML IV SOLN
2.0000 g | Freq: Once | INTRAVENOUS | Status: AC
  Administered 2018-11-04: 2 g via INTRAVENOUS
  Filled 2018-11-04: qty 50

## 2018-11-04 MED ORDER — PANTOPRAZOLE SODIUM 40 MG IV SOLR
40.0000 mg | Freq: Two times a day (BID) | INTRAVENOUS | Status: DC
  Administered 2018-11-04 – 2018-11-07 (×7): 40 mg via INTRAVENOUS
  Filled 2018-11-04 (×7): qty 40

## 2018-11-04 MED ORDER — POTASSIUM CHLORIDE 10 MEQ/100ML IV SOLN: 10.0000 meq | INTRAVENOUS | Status: DC

## 2018-11-04 MED ORDER — FENTANYL CITRATE (PF) 100 MCG/2ML IJ SOLN
50.0000 ug | INTRAMUSCULAR | Status: DC | PRN
  Administered 2018-11-04 – 2018-11-05 (×6): 50 ug via INTRAVENOUS
  Filled 2018-11-04 (×7): qty 2

## 2018-11-04 MED ORDER — DEXTROSE 5 % IV SOLN
INTRAVENOUS | Status: DC
  Administered 2018-11-05: 02:00:00 via INTRAVENOUS

## 2018-11-04 MED ORDER — FUROSEMIDE 10 MG/ML IJ SOLN
40.0000 mg | Freq: Two times a day (BID) | INTRAMUSCULAR | Status: DC
  Administered 2018-11-04 – 2018-11-07 (×6): 40 mg via INTRAVENOUS
  Filled 2018-11-04 (×7): qty 4

## 2018-11-04 MED ORDER — POTASSIUM CHLORIDE 10 MEQ/50ML IV SOLN
10.0000 meq | INTRAVENOUS | Status: AC
  Administered 2018-11-04 – 2018-11-05 (×4): 10 meq via INTRAVENOUS
  Filled 2018-11-04 (×4): qty 50

## 2018-11-04 MED ORDER — INSULIN ASPART 100 UNIT/ML ~~LOC~~ SOLN
0.0000 [IU] | Freq: Four times a day (QID) | SUBCUTANEOUS | Status: DC
  Administered 2018-11-05 (×2): 1 [IU] via SUBCUTANEOUS

## 2018-11-04 MED ORDER — ORAL CARE MOUTH RINSE
15.0000 mL | Freq: Two times a day (BID) | OROMUCOSAL | Status: DC
  Administered 2018-11-04 – 2018-11-06 (×6): 15 mL via OROMUCOSAL

## 2018-11-04 MED ORDER — POTASSIUM PHOSPHATES 15 MMOLE/5ML IV SOLN
15.0000 mmol | Freq: Once | INTRAVENOUS | Status: AC
  Administered 2018-11-04: 15 mmol via INTRAVENOUS
  Filled 2018-11-04: qty 5

## 2018-11-04 NOTE — Progress Notes (Signed)
Brief Pharmacy Consult Note - TPN  Labs: K 2.7  A/P: K still low despite Kphos and multiple KCL runs the last couple of days. Will order 4 more runs of IV KCL. Follow up in AM  Adrian Saran, PharmD, BCPS 11/04/2018 9:43 PM

## 2018-11-04 NOTE — Progress Notes (Signed)
At bedside to place picc however patient has pacemaker on right and extreme lymphedema on left . We can not place picc in left arm and if picc placement is desired on the right then she will need to go to IR to have it placed under fluro . She may benefit from a tunneled catheter on the left. Patient is not a candidate for bedside picc placement. RN aware and will notify MD.

## 2018-11-04 NOTE — Progress Notes (Signed)
PT Cancellation Note  Patient Details Name: Patricia Murillo MRN: [id number] DOB: 01/01/1946   Cancelled Treatment:     PT orders received but eval deferred this am - pt being extubated.  Will follow.  Debe Coder PT Acute Rehabilitation Services Pager [phone] Office [phone]    [facility] 11/04/2018, 12:13 PM

## 2018-11-04 NOTE — Progress Notes (Signed)
Pharmacy Antibiotic Note  Patricia Murillo is a 73 y.o. female admitted on 10/11/2018 with hematemesis.  She has completed 7 days of Zosyn for aspiration pneumonia.  Pharmacy has been consulted for fluconazole dosing for candida tropicalis pneumonia.  Today, 11/04/2018:  D 7/7 Zosyn, D5 Fluconazole  Low grade temp, Tm 100.3  WBC up to 3.7 last on 1/30 (after neutropenia, pegfilgrastim 1/22)  SCr remains low, stable  Plan:  Continue fluconazole 400mg  IV q24h   Follow up renal function, culture results, and clinical course.  Pharmacy to sign off note writing, but will continue to f/u peripherally for renal function.  Height: 5\' 7"  (170.2 cm) Weight: 147 lb 0.8 oz (66.7 kg) IBW/kg (Calculated) : 61.6  Temp (24hrs), Avg:99.2 F (37.3 C), Min:98.2 F (36.8 C), Max:100.3 F (37.9 C)  Recent Labs  Lab 10/28/2018 1731  10/30/18 1558 10/31/18 0310  11/01/18 0436  11/02/18 0318 11/02/18 1915 11/03/18 0430 11/03/18 0820 11/03/18 2100 11/04/18 0400  WBC  --    < > 0.7* 0.6*  --  0.2*  --  0.4*  --   --  3.7*  --   --   CREATININE  --    < >  --  0.64   < > 0.42*   < > 0.53 0.46 0.54  --  0.51 0.47  LATICACIDVEN 1.5  --   --   --   --   --   --   --   --   --   --   --   --    < > = values in this interval not displayed.    Estimated Creatinine Clearance: 62.7 mL/min (by C-G formula based on SCr of 0.47 mg/dL).    Allergies  Allergen Reactions  . Apixaban Other (See Comments)    Rectal bleeding within 2 days of taking medication   Antimicrobials this admission:  PTA Acyclovir >>  PTA Cipro >> 1/25 1/25 Zosyn >> 1/31 1/27 Fluconazole >>   Microbiology results:  1/23 MRSA PCR: neg 1/25 Trach asp: Mod Candida tropicalis- final  Thank you for allowing pharmacy to be a part of this patient's care.  Gretta Arab PharmD, BCPS Pager [phone] 11/04/2018 12:25 PM

## 2018-11-04 NOTE — Progress Notes (Signed)
Nutrition Follow-up  DOCUMENTATION CODES:   Severe malnutrition in context of chronic illness  INTERVENTION:  - TPN per Pharmacy. - Will monitor for ability for diet advancement.  Monitor magnesium, potassium, and phosphorus daily for at least 3 days, MD to replete as needed, as pt is at risk for refeeding syndrome given severe malnutrition, poor nutrition x1 month PTA, no nutrition since admission 8 days ago.    NUTRITION DIAGNOSIS:   Severe Malnutrition related to chronic illness, cancer and cancer related treatments as evidenced by percent weight loss, energy intake < or equal to 75% for > or equal to 1 month, moderate fat depletion, severe muscle depletion. -ongoing  GOAL:   Patient will meet greater than or equal to 90% of their needs -unmet/unable to meet at this time.  MONITOR:   Diet advancement, Weight trends, Labs, Skin, I & O's, Other (Comment)(TPN regimen)  ASSESSMENT:   73 year old with past medical history relevant for hypertension, pain, non-Hodgkin's lymphoma status post chemotherapy 10/24/2018 with R-CHOP, status post permanent pacemaker, sacral decubitus ulcer admitted from assisted living facility on 10/15/2018 with reports of hematemesis status post EGD showing cratered gastric ulcers.  Weight has been stable. Estimated nutrition needs updated as patient was extubated earlier this AM. Spoke with Pharmacist briefly about plan for TPN initiation today. Patient with triple lumen R IJ in place. NGT was unable to be placed by IR yesterday d/t inability to pass tube past hiatal hernia.     Medications reviewed; 40 mg IV lasix BID, 40 mg IV protonix BID, 10 mEq IV KCl x6 runs 1/30. Labs reviewed; Na: 147 mmol/l, Cl: 112 mmol/l, Ca: 6.9 mg/dl. IVF; D5 @ 60 ml/hr (245 kcal).    Diet Order:   Diet Order            Diet NPO time specified  Diet effective now              EDUCATION NEEDS:   Education needs have been addressed  Skin:  Skin Assessment: Skin  Integrity Issues: Skin Integrity Issues:: Stage III Stage III: sacrum  Last BM:  1/30  Height:   Ht Readings from Last 1 Encounters:  10/31/18 5\' 7"  (1.702 m)    Weight:   Wt Readings from Last 1 Encounters:  11/04/18 66.7 kg    Ideal Body Weight:  61.4 kg  BMI:  Body mass index is 23.03 kg/m.  Estimated Nutritional Needs:   Kcal:  2000-2200 kcal  Protein:  100-110g  Fluid:  1.5L/day     Jarome Matin, MS, RD, LDN, CNSC Inpatient Clinical Dietitian Pager # [phone] After hours/weekend pager # [phone]

## 2018-11-04 NOTE — Procedures (Signed)
Extubation Procedure Note  Patient Details:   Name: Patricia Murillo DOB: [birth year] MRN: [id number]   Airway Documentation:    Vent end date: 11/04/18 Vent end time: 0943   Evaluation  O2 sats: stable throughout Complications: No apparent complications Patient did tolerate procedure well. Bilateral Breath Sounds: Clear, Diminished   Yes  Johnette Abraham 11/04/2018, 9:44 AM

## 2018-11-04 NOTE — Progress Notes (Signed)
NAME:  Patricia Murillo, MRN:  [id number], DOB:  1946/09/04, LOS: 8 ADMISSION DATE:  10/30/2018, CONSULTATION DATE:  REFERRING MD:  Dr Wilson Singer, CHIEF COMPLAINT: Hematemesis  Brief History   Patient was brought into the hospital 1/23 for hematemesis this afternoon. She did decompensate overnight 10/20/2018 Aspirated and further decompensated,Intubated and on ventilator at present; PCCM asked to a/w care   Past Medical History  Arthritis, hiatal hernia, non-Hodgkin's lymphoma, hyperlipidemia.  Significant Hospital Events   Has remained edema dynamically stable 1/24-complaining of nausea vomiting, vomited a few times and aspirated, progressed to respiratory failure, required intubation.  Hemodynamic compromise following initial stabilization Significant amount of coffee grounds noted in endotracheal tube following intubation 1/25-weaned off pressors.  Awake, interactive, moving all extremities 1/25 through 1/29: Was extubated on the 26, was reintubated on 27th due to poor ventilatory mechanics and poor cough mechanics.  She had Candida tropicalis growing in her sputum and because she was immunocompromised we opted to start Diflucan as well.  On 1/28 we began to reduce sedation in hopes of readying her for weaning efforts.  Echocardiogram results show new reduced ejection fraction 1/29: Cycling on pressure support ventilation: Escalating diuresis, adding low-dose beta-blockade  1/30: Tolerating pressure support at 10 cm water, still with accessory use.  Titrating up beta-blockade.  Continuing diuresis.  Platelets now greater than 100 so IR was consulted for feeding tube unfortunately they were unable to pass the tube past the hiatal hernia 1/31 extubated. Diuresis continued. PICC line ordered for TPN  Consults:  GI-post endoscopy significant for multiple ulcers-nonbleeding Repeat endoscopy on 10/25/2018-showed gastric edema, hemospray.  Large amounts of altered blood noted in the  stomach  Procedures:  10/05/2018 right IJ placed 11/02/2018 endotracheal tube placed  Significant Diagnostic Tests:  Last CT chest was 09/07/2018 showing progression of lymphoma, multiple masses and extensive adenopathy CT abdomen 08/31/2018 also did reveal extensive adenopathy Chest x-ray on 10/16/2018-no new infiltrative process, large hiatal hernia, tube appropriately positioned Chest x-ray post line placement noted-no pneumothorax, fluffy infiltrate on left TTE 1/27 >> normal LV size, systolic function mild to moderately reduced, EF 40 to 45% (decreased from 55 to 60% 08/31/2018)  Micro Data:  Respiratory 1/25 >> Candida tropicalis  Antimicrobials:  Zosyn 10/13/2018>>1/31 Fluconazole 1/27 >>   Interim history/subjective:  Passed spontaneous breathing trial Objective   Blood pressure 116/63, pulse 80, temperature 98.6 F (37 C), temperature source Oral, resp. rate 16, height 5\' 7"  (1.702 m), weight 66.7 kg, SpO2 96 %. CVP:  [6 mmHg-8 mmHg] 6 mmHg  Vent Mode: PSV;CPAP FiO2 (%):  [30 %] 30 % Set Rate:  [16 bmp] 16 bmp Vt Set:  [490 mL] 490 mL PEEP:  [5 cmH20] 5 cmH20 Pressure Support:  [5 cmH20] 5 cmH20 Plateau Pressure:  [15 cmH20-19 cmH20] 15 cmH20   Intake/Output Summary (Last 24 hours) at 11/04/2018 9417 Last data filed at 11/04/2018 0500 Gross per 24 hour  Intake 2412.47 ml  Output 1950 ml  Net 462.47 ml   Filed Weights   11/02/18 0301 11/03/18 0357 11/04/18 0459  Weight: 68.3 kg 67.6 kg 66.7 kg    Examination: General: This is a debilitated 73 year old female she is resting in bed, she has passed her spontaneous breathing trial this morning HEENT mucous membranes moist neck veins flat alopecia appreciated Pulmonary: Scattered rhonchi, postextubation has rhonchorous cough but she is able to expectorate with assistance of suction Cardiac: Regular rate and rhythm Abdomen: Soft nontender no organomegaly brisk capillary refill Extremities: Decreasing anasarca brisk  capillary refill scattered areas of ecchymosis with weeping upper extremities Neuro: Awake, oriented no focal deficits generalized weakness GU: Clear yellow  Resolved Hospital Problem list   Hypocalcemia Shock, likely hemorrhagic plus component of septic  Assessment & Plan:  Acute hypoxemic respiratory failure, secondary to aspiration pneumonia and poor ventilatory and cough mechanics  Sputum growing Candida tropicalis requiring reintubation 1/27 Review of her portable chest x-ray shows significant improvement with bilateral aeration. She is passed her spontaneous breathing trial Plan Extubate Incentive spirometry N.p.o. Pulse oximetry   Acute blood loss anemia due to upper GI bleed from bleeding gastric ulcers -Gastric biopsies without any evidence for lymphoma in the gastric lining -Hemoglobin stable Plan Continuing PPI with GI follow-up as outpatient, I am transitioning IV drip to PPI twice daily today  New left ventricular dysfunction by echocardiogram 1/27 ejection fraction is now 40 to 45%.  Consider due to chemotherapy, consider due to contribution of sepsis Plan Continuing IV diuresis as long as BUN, creatinine, and blood pressure will tolerate Continue IV beta-blockade, hopefully transition to Coreg if we can get her to take p.o.  Severe protein calorie malnutrition, has a history of a hiatal hernia.   -We were unable to advance regular nasogastric tube even with radiology assistance, unfortunately she has been nothing by mouth for several days now. Plan  Consulted pharmacy for TPN No lipids at this point given risk for infection Reconsider swallow eval as she gets stronger  fluid and electrolyte imbalance: Hypernatremia, hypokalemia Plan Continuing D5 water at 60 cc an hour Replace potassium A.m. chemistry and magnesium  Hypoglycemia -Blood glucose remains in the 70s Plan Continuing D10 for now, will be able to stop this once she is on TPN   B-cell  lymphoma, received chemotherapy week prior to admission Plan Oncology following remotely, I do not see how she could be a chemotherapy agent for quite some time given her degree of deconditioning and malnutrition Outpatient PET scan  Neutropenia due to chemotherapy, this is improving Reviewed Dr. Fritz Pickerel notes, no role for repeat dosing G-CSF (given on 1/22) G-CSF per onc (given 1/22) Plan Follow-up CBC  Sacral pressure ulcers: Seen by wound ostomy nurse, this is deemed a non-healable ulcer. Plan Continue current wound care Maximizing nutritional efforts If we can improve her functional status, as well as nutritional efforts we could consider plastics consult  History of third-degree heart block Has a permanent pacemaker Plan Continue telemetry   Best practice:  Diet: N.p.o. starting TPN 1/31 Pain/Anxiety/Delirium protocol (if indicated): Propofol, fentanyl: Discontinued 1/31 VAP protocol (if indicated): Discontinued 1/31 DVT prophylaxis: SCD, GI bleed GI prophylaxis: On Protonix drip: Transferred to twice daily 1/31 Glucose control: ssi Mobility: Bedrest: Out of bed 1/31 Code Status: Full code Family Communication: discussed with daughter at bedside 1/28 Disposition: She remains critically ill however she has improved significantly.  She has now passed her spontaneous breathing trial and we will extubate her today.  She is still at high risk for reintubation given her profound deconditioning, poor cough mechanics, and newly identified cardiomyopathy.  Treatment at this point will include aggressive pulmonary hygiene, starting IV nutrition today, and beginning to focus on rehabilitation efforts.  Critical care x38 minutes including time for extubation and assessment post extubation  Erick Colace ACNP-BC [place] Pager # [phone] OR # [phone] if no answer

## 2018-11-04 NOTE — Progress Notes (Signed)
[place] NOTE   Pharmacy Consult for TPN Indication: Intolerance to enteral feeding  Patient Measurements: Body mass index is 23.03 kg/m. Filed Weights   11/02/18 0301 11/03/18 0357 11/04/18 0459  Weight: 150 lb 9.2 oz (68.3 kg) 149 lb 0.5 oz (67.6 kg) 147 lb 0.8 oz (66.7 kg)   HPI:  62 yoF admitted on 1/23 with hematemesis.  She developed severe recurrent GIB, s/p EGD on 1/24 and 1/25.  She was unable to have feeding tube placed RN on 1/27 and GI recommended feeding tube to be placed by IR under fluoroscopy as neutropenia improves.  IR was unable to place PEG tube on 1/30.  Pharmacy is now consulted for TPN, with plans to reconsider swallow eval as she gets stronger.  High risk for refeeding syndrome given severe malnutrition, no nutrition for 8 days, poor intake x1 month PTA.  PMH significant for: hiatal hernia, hyperlipidemia, non Hodgkin's lymphoma with most recent chemo given on 10/24/18.  Significant events:  1/31 Extubated, Propofol d/c, Protonix changed to IVP  Insulin requirements past 24 hours: none (no hx DM)  Current Nutrition: NPO   IVF: D5 at 60 ml/hr  Central access: CVC triple lumen, 1/25 TPN start date: 1/31  ASSESSMENT                                                                                                          Today:   Glucose:  (goal 100-150), below goal on D5 infusion  Electrolytes: Na elevated on D5 infusion, Cl elevated, CorrCa low at 8.3, Phos low at 1.7  Others WNL including K 3.8  (s/p KCl x6 runs on 1/30 and x6 runs overnight 1/30-1/31), and Mag 1.8  Continues on Lasix 40 mg IV q12h  Renal:  SCr stable  LFTs:  WNL (1/28)  TGs:  235 (1/31) on Propofol that has been d/c.  No lipids at this time.  Prealbumin:  Pending (2/1)   NUTRITIONAL GOALS                                                                                             RD recs (updated 1/31) Kcal:  2000-2200 kcal Protein:   100-110g Fluid:  1.5L/day  Per MD order and while the patient meets ICU status the initiation of lipids is being delayed for 7 days or until transition out of ICU. Planned start date for lipids (if meeting MD criteria of completely recovered WBC) is 11/11/2018.  Goal will be to meet 100% of the patient's protein needs and approximately 80% of their caloric needs.   Custom TPN at goal rate of 75 ml/hr provides: - 110 g/day protein (61  g/L) meeting 100% of needs  - 360 g/day Dextrose (20%)  - 1663 Kcal/day meeting 83% of Kcal needs while holding lipids.  PLAN                                                                                                                         Now:  Mag 2g IV x1 dose   KPhos 15 mmol (also provides 22 mEq K+) IV x1 dose  Recheck K at 20:00 - supplement if needed per pharmacy protocol.  At 1800 today:  Begin Custom TPN at 35 ml/hr  Provides 51 g protein and 776 Kcal  NO lipids at this time (start date 11/11/18 if meeting MD criteria of completely recovered WBC)  Elytes:  Remove Na, continue standard K, Ca, Mag, increase to Phos 20 mmol/L.  1:2 Cl:Ac ratio  Standard multivitamins and trace element daily.  Plan to advance as tolerated to the goal rate.  Reduce IVF to 30 ml/hr.  Add CBGs and sensitive SSI q6h.   TPN lab panels on Mondays & Thursdays.  BMET, mag, phos on [facility] PharmD, [place] Pager [phone] 11/04/2018 9:53 AM

## 2018-11-04 NOTE — Progress Notes (Signed)
Patricia Murillo   DOB:05/24/1946   HG#:[id number]   HDQ#:[id number]  Hematology and oncology follow-up note  Subjective: Pt was extubated this morning, awake, drowsy, has hoarseness, VS stable   Objective:  Vitals:   11/04/18 1200 11/04/18 1300  BP: (!) 157/59 128/77  Pulse: (!) 101 83  Resp: (!) 32 (!) 32  Temp: 98.1 F (36.7 C)   SpO2: 94% 94%    Body mass index is 23.03 kg/m.  Intake/Output Summary (Last 24 hours) at 11/04/2018 1705 Last data filed at 11/04/2018 1530 Gross per 24 hour  Intake 2257.6 ml  Output 4050 ml  Net -1792.4 ml     Pt awake and alert   Lungs clear -- no rales or rhonchi  Heart regular rate and rhythm  Abdomensoft  Skin: Diffuse ecchymosis on bilateral arms   CBG (last 3)  Recent Labs    11/04/18 0418 11/04/18 0812 11/04/18 1230  GLUCAP 78 82 83     Labs:  Lab Results  Component Value Date   WBC 3.7 (L) 11/03/2018   HGB 9.9 (L) 11/03/2018   HCT 31.9 (L) 11/03/2018   MCV 90.1 11/03/2018   PLT 137 (L) 11/03/2018   NEUTROABS 1.1 (L) 10/07/2018   CMP Latest Ref Rng & Units 11/04/2018 11/03/2018 11/03/2018  Glucose 70 - 99 mg/dL 79 95 89  BUN 8 - 23 mg/dL 13 13 12   Creatinine 0.44 - 1.00 mg/dL 0.47 0.51 0.54  Sodium 135 - 145 mmol/L 147(H) 145 149(H)  Potassium 3.5 - 5.1 mmol/L 3.8 2.8(L) 3.2(L)  Chloride 98 - 111 mmol/L 112(H) 108 111  CO2 22 - 32 mmol/L 27 29 26   Calcium 8.9 - 10.3 mg/dL 6.9(L) 7.0(L) 7.5(L)  Total Protein 6.5 - 8.1 g/dL - - -  Total Bilirubin 0.3 - 1.2 mg/dL - - -  Alkaline Phos 38 - 126 U/L - - -  AST 15 - 41 U/L - - -  ALT 0 - 44 U/L - - -     Urine Studies No results for input(s): UHGB, CRYS in the last 72 hours.  Invalid input(s): UACOL, UAPR, USPG, UPH, UTP, UGL, UKET, UBIL, UNIT, UROB, ULEU, UEPI, UWBC, URBC, UBAC, CAST, [place], [place]  Basic Metabolic Panel: Recent Labs  Lab 10/31/18 1049 10/31/18 1850  11/01/18 0436 11/01/18 1624 11/02/18 0318 11/02/18 1915 11/02/18 2033 11/03/18 0430  11/03/18 2100 11/04/18 0400  NA  --   --    < > 147* 149* 150* 147*  --  149* 145 147*  K  --   --    < > 2.9* 2.5* 2.9* 2.5*  --  3.2* 2.8* 3.8  CL  --   --    < > 115* 115* 114* 110  --  111 108 112*  CO2  --   --    < > 25 26 27 26   --  26 29 27   GLUCOSE  --   --    < > 87 89 86 83  --  89 95 79  BUN  --   --    < > 11 10 12 11   --  12 13 13   CREATININE  --   --    < > 0.42* 0.48 0.53 0.46  --  0.54 0.51 0.47  CALCIUM  --   --    < > 8.9 8.4* 8.1* 7.7*  --  7.5* 7.0* 6.9*  MG 1.5* 1.4*  --  2.0 1.5* 1.6*  --  2.1  --   --  1.8  PHOS 2.2* 2.0*  --  2.6 2.3*  --   --   --   --   --  1.7*   < > = values in this interval not displayed.   GFR Estimated Creatinine Clearance: 62.7 mL/min (by C-G formula based on SCr of 0.47 mg/dL). Liver Function Tests: Recent Labs  Lab 10/31/18 0310 11/01/18 0436  AST 35 19  ALT 20 14  ALKPHOS 126 83  BILITOT 1.4* 1.5*  PROT 5.5* 4.6*  ALBUMIN 2.7* 2.2*   No results for input(s): LIPASE, AMYLASE in the last 168 hours. No results for input(s): AMMONIA in the last 168 hours. Coagulation profile Recent Labs  Lab 10/30/18 1558 11/01/18 0436  INR 1.51 1.38    CBC: Recent Labs  Lab 10/31/2018 0635  10/22/2018 2044  10/30/18 1558 10/31/18 0310  11/01/18 0436  11/02/18 0318 11/02/18 0916 11/02/18 1716 11/03/18 0100 11/03/18 0820  WBC 18.5*   < > 2.3*  --  0.7* 0.6*  --  0.2*  --  0.4*  --   --   --  3.7*  NEUTROABS 14.2*  --  1.1*  --   --   --   --   --   --   --   --   --   --   --   HGB 8.0*   < > 6.1*   < > 10.2* 10.5*   < > 8.8*   < > 9.0* 9.3* 8.9* 9.8* 9.9*  HCT 26.7*   < > 20.3*   < > 31.2* 32.6*   < > 27.5*   < > 28.3* 29.3* 28.5* 31.0* 31.9*  MCV 98.2   < > 96.7  --  87.9 88.3  --  89.9  --  90.1  --   --   --  90.1  PLT 205   < > 35*  --  21* 52*  --  46*  48*  --  69*  --   --   --  137*   < > = values in this interval not displayed.   Cardiac Enzymes: No results for input(s): CKTOTAL, CKMB, CKMBINDEX, TROPONINI in the last  168 hours. BNP: Invalid input(s): POCBNP CBG: Recent Labs  Lab 11/03/18 1926 11/03/18 2343 11/04/18 0418 11/04/18 0812 11/04/18 1230  GLUCAP 91 88 78 82 83   D-Dimer No results for input(s): DDIMER in the last 72 hours. Hgb A1c No results for input(s): HGBA1C in the last 72 hours. Lipid Profile Recent Labs    11/03/18 0431 11/04/18 0400  TRIG 238* 235*   Thyroid function studies No results for input(s): TSH, T4TOTAL, T3FREE, THYROIDAB in the last 72 hours.  Invalid input(s): FREET3 Anemia work up No results for input(s): VITAMINB12, FOLATE, FERRITIN, TIBC, IRON, RETICCTPCT in the last 72 hours. Microbiology Recent Results (from the past 240 hour(s))  MRSA PCR Screening     Status: None   Collection Time: 10/05/2018  6:19 PM  Result Value Ref Range Status   MRSA by PCR NEGATIVE NEGATIVE Final    Comment:        The GeneXpert MRSA Assay (FDA approved for NASAL specimens only), is one component of a comprehensive MRSA colonization surveillance program. It is not intended to diagnose MRSA infection nor to guide or monitor treatment for MRSA infections. Performed at [facility], [place] [address] [place], [place] [address]   Culture, respiratory (non-expectorated)     Status: None   Collection Time:  10/07/2018  9:03 AM  Result Value Ref Range Status   Specimen Description   Final    TRACHEAL ASPIRATE Performed at [facility] [address], [place], [place] [address]    Special Requests   Final    NONE Performed at [facility], [place] [address], [place], [place] [address]    Gram Stain   Final    NO WBC SEEN NO SQUAMOUS EPITHELIAL CELLS SEEN MODERATE BUDDING YEAST SEEN RARE GRAM POSITIVE RODS RARE GRAM NEGATIVE RODS Performed at [facility], [address] [place], [place] [address]    Culture MODERATE CANDIDA TROPICALIS  Final   Report Status 10/31/2018 FINAL  Final      Studies:  Dg  Chest Port 1 View  Result Date: 11/04/2018 CLINICAL DATA:  Dysphagia. EXAM: PORTABLE CHEST 1 VIEW COMPARISON:  11/03/2017. FINDINGS: Endotracheal tube, right IJ line in stable position. Cardiac pacer stable position. Stable cardiomegaly. Stable bilateral infiltrates/edema. Stable left-sided pleural effusion. No pneumothorax. IMPRESSION: 1.  Lines and tubes in stable position. 2.  Cardiac pacer stable position.  Stable cardiomegaly. 3. Bilateral pulmonary infiltrates/edema and small left pleural effusion again noted. Bibasilar atelectasis. No significant change from prior exam. Electronically Signed   By: Marcello Moores  Register   On: 11/04/2018 05:43   Dg Chest Port 1 View  Result Date: 11/03/2018 CLINICAL DATA:  Pulmonary edema. EXAM: PORTABLE CHEST 1 VIEW COMPARISON:  11/01/2018 FINDINGS: Endotracheal tube terminates 3.7 cm above the carina. Right jugular catheter terminates over the lower SVC. Pacemaker remains in place. The cardiac silhouette remains enlarged. Perihilar opacities have improved. There is also improved aeration of the right lung base. There is persistent opacity in the left lung base which may reflect a small pleural effusion with atelectasis or consolidation. Minimal hazy opacity in the right lung base may reflect a small residual pleural effusion as well. No pneumothorax is identified. IMPRESSION: Improved lung aeration with decreased pulmonary edema. Persistent small left pleural effusion with left basilar atelectasis or consolidation. Electronically Signed   By: Logan Bores M.D.   On: 11/03/2018 06:44   Dg Abd Portable 1v  Result Date: 11/03/2018 CLINICAL DATA:  Nasogastric tube placement EXAM: PORTABLE ABDOMEN - 1 VIEW COMPARISON:  Portable exam 1059 hours compared to 10/31/2018 FINDINGS: Tip of feeding tube projects over the inferior LEFT mediastinum, likely within hiatal hernia. Decreased LEFT basilar atelectasis versus consolidation with persistent pleural effusion. Bowel gas pattern  normal. RIGHT lung base grossly clear. Bones demineralized. IMPRESSION: Tip of feeding tube projects over hiatal hernia, above diaphragm. Electronically Signed   By: Lavonia Dana M.D.   On: 11/03/2018 11:34   Dg Intro Long Gi Tube  Result Date: 11/03/2018 CLINICAL DATA:  Attempt to advance feeding tube into duodenum. EXAM: INTRO LONG GI TUBE CONTRAST:  72mL ISOVUE-300 IOPAMIDOL (ISOVUE-300) INJECTION 61% FLUOROSCOPY TIME:  Fluoroscopy Time:  2 minutes and 35 seconds Radiation Exposure Index (if provided by the fluoroscopic device): Not applicable. Number of Acquired Spot Images: 1 COMPARISON:  Chest CT of 110-08-2018. FINDINGS: Attempt was made to advance the feeding tube from the hiatal hernia into the subdiaphragmatic stomach. Small amount of contrast was administered (50 cc of Isovue 300). The contrast did not pass into the subdiaphragmatic stomach. Despite extensive manipulation, the feeding tube could not be advanced into the subdiaphragmatic stomach. It was left within the hiatal hernia. IMPRESSION: Unsuccessful advancement of the feeding tube into the subdiaphragmatic stomach. Electronically Signed   By: Abigail Miyamoto  M.D.   On: 11/03/2018 16:25   [place] Ekg Site Rite  Result Date: 11/04/2018 If Site Rite image not attached, placement could not be confirmed due to current cardiac rhythm.   Assessment: 73 y.o. with recently diagnosed non-Hodgkin diffuse large B-cell lymphoma, status post chemotherapy on 10/24/2018, sacral decubitus, complete heart block status post pacemaker, admitted for upper GI bleeding  1.  Upper GI bleeding secondary to multiple gastric ulcers 2.  Severe pancytopenia, secondary to chemo, much improved  3.  Diffuse large B-cell lymphoma, stage IV, status post 3 cycles of chemotherapy R-CHOP, last cycle on October 24, 2018 4.  Aspiration pneumonia with respiratory failure, intubated 5.  Sacral decubitus 6.  Complete heart block status post pacemaker   Plan:  -pt continue to  improve clinically, extubated today, vital signs maintain stable -She needs a central line for TPN. Ideally she needs a port also for chemo (3 more cycle), since her lymphoedema on left chest wall has near resolved, we may be able to try a port on left chest wall  -she will need rehab, possible wound debridement for decubital ulcer, will postpone her next cycle chemo for a while.  -I will f/u   Truitt Merle, MD 11/04/2018

## 2018-11-05 ENCOUNTER — Inpatient Hospital Stay (HOSPITAL_COMMUNITY): Payer: Medicare HMO

## 2018-11-05 LAB — DIFFERENTIAL
Abs Immature Granulocytes: 0.4 10*3/uL — ABNORMAL HIGH (ref 0.00–0.07)
Band Neutrophils: 4 %
Basophils Absolute: 0 10*3/uL (ref 0.0–0.1)
Basophils Relative: 0 %
Eosinophils Absolute: 0.2 10*3/uL (ref 0.0–0.5)
Eosinophils Relative: 1 %
LYMPHS PCT: 0 %
Lymphs Abs: 0 10*3/uL — ABNORMAL LOW (ref 0.7–4.0)
Monocytes Absolute: 0.4 10*3/uL (ref 0.1–1.0)
Monocytes Relative: 2 %
Myelocytes: 2 %
Neutro Abs: 17.1 10*3/uL — ABNORMAL HIGH (ref 1.7–7.7)
Neutrophils Relative %: 91 %

## 2018-11-05 LAB — COMPREHENSIVE METABOLIC PANEL
ALT: 12 U/L (ref 0–44)
AST: 20 U/L (ref 15–41)
Albumin: 2.3 g/dL — ABNORMAL LOW (ref 3.5–5.0)
Alkaline Phosphatase: 117 U/L (ref 38–126)
Anion gap: 11 (ref 5–15)
BUN: 12 mg/dL (ref 8–23)
CO2: 29 mmol/L (ref 22–32)
Calcium: 6.9 mg/dL — ABNORMAL LOW (ref 8.9–10.3)
Chloride: 102 mmol/L (ref 98–111)
Creatinine, Ser: 0.32 mg/dL — ABNORMAL LOW (ref 0.44–1.00)
GFR calc Af Amer: >60 mL/min (ref >60)
GFR calc non Af Amer: >60 mL/min (ref >60)
Glucose, Bld: 178 mg/dL — ABNORMAL HIGH (ref 70–99)
Potassium: 2.9 mmol/L — ABNORMAL LOW (ref 3.5–5.1)
Sodium: 142 mmol/L (ref 135–145)
Total Bilirubin: 1.1 mg/dL (ref 0.3–1.2)
Total Protein: 5 g/dL — ABNORMAL LOW (ref 6.5–8.1)

## 2018-11-05 LAB — GLUCOSE, CAPILLARY
GLUCOSE-CAPILLARY: 121 mg/dL — AB (ref 70–99)
Glucose-Capillary: 129 mg/dL — ABNORMAL HIGH (ref 70–99)
Glucose-Capillary: 105 mg/dL — ABNORMAL HIGH (ref 70–99)
Glucose-Capillary: 127 mg/dL — ABNORMAL HIGH (ref 70–99)

## 2018-11-05 LAB — CBC
HCT: 33.9 % — ABNORMAL LOW (ref 36.0–46.0)
Hemoglobin: 10.3 g/dL — ABNORMAL LOW (ref 12.0–15.0)
MCH: 28.1 pg (ref 26.0–34.0)
MCHC: 30.4 g/dL (ref 30.0–36.0)
MCV: 92.4 fL (ref 80.0–100.0)
Platelets: 266 10*3/uL (ref 150–400)
RBC: 3.67 MIL/uL — ABNORMAL LOW (ref 3.87–5.11)
RDW: 16.8 % — ABNORMAL HIGH (ref 11.5–15.5)
WBC: 18 10*3/uL — ABNORMAL HIGH (ref 4.0–10.5)
nRBC: 0 % (ref 0.0–0.2)

## 2018-11-05 LAB — PREALBUMIN: Prealbumin: 10 mg/dL — ABNORMAL LOW (ref 18–38)

## 2018-11-05 LAB — TRIGLYCERIDES: Triglycerides: 226 mg/dL — ABNORMAL HIGH (ref <150)

## 2018-11-05 LAB — PHOSPHORUS: Phosphorus: 1.3 mg/dL — ABNORMAL LOW (ref 2.5–4.6)

## 2018-11-05 LAB — MAGNESIUM: Magnesium: 1.9 mg/dL (ref 1.7–2.4)

## 2018-11-05 MED ORDER — TRAVASOL 10 % IV SOLN
INTRAVENOUS | Status: AC
  Administered 2018-11-05: 17:00:00 via INTRAVENOUS
  Filled 2018-11-05: qty 512.4

## 2018-11-05 MED ORDER — POTASSIUM PHOSPHATES 15 MMOLE/5ML IV SOLN
30.0000 mmol | Freq: Once | INTRAVENOUS | Status: AC
  Administered 2018-11-05: 30 mmol via INTRAVENOUS
  Filled 2018-11-05: qty 10

## 2018-11-05 MED ORDER — ACETAMINOPHEN 650 MG RE SUPP
650.0000 mg | Freq: Four times a day (QID) | RECTAL | Status: DC | PRN
  Administered 2018-11-05 – 2018-11-07 (×2): 650 mg via RECTAL
  Filled 2018-11-05 (×2): qty 1

## 2018-11-05 MED ORDER — MIDAZOLAM HCL 2 MG/2ML IJ SOLN
0.5000 mg | Freq: Once | INTRAMUSCULAR | Status: AC
  Administered 2018-11-05: 0.5 mg via INTRAVENOUS
  Filled 2018-11-05: qty 2

## 2018-11-05 MED ORDER — POTASSIUM CHLORIDE 10 MEQ/50ML IV SOLN
10.0000 meq | INTRAVENOUS | Status: AC
  Administered 2018-11-05 (×6): 10 meq via INTRAVENOUS
  Filled 2018-11-05 (×6): qty 50

## 2018-11-05 MED ORDER — INSULIN ASPART 100 UNIT/ML ~~LOC~~ SOLN
0.0000 [IU] | Freq: Three times a day (TID) | SUBCUTANEOUS | Status: DC
  Administered 2018-11-05 – 2018-11-07 (×3): 1 [IU] via SUBCUTANEOUS

## 2018-11-05 MED ORDER — FENTANYL CITRATE (PF) 100 MCG/2ML IJ SOLN
50.0000 ug | INTRAMUSCULAR | Status: DC | PRN
  Administered 2018-11-05: 75 ug via INTRAVENOUS
  Administered 2018-11-06: 50 ug via INTRAVENOUS
  Administered 2018-11-06: 75 ug via INTRAVENOUS
  Administered 2018-11-06: 50 ug via INTRAVENOUS
  Administered 2018-11-06 (×2): 75 ug via INTRAVENOUS
  Administered 2018-11-06 – 2018-11-07 (×3): 50 ug via INTRAVENOUS
  Administered 2018-11-07: 75 ug via INTRAVENOUS
  Filled 2018-11-05 (×10): qty 2

## 2018-11-05 MED ORDER — INSULIN ASPART 100 UNIT/ML ~~LOC~~ SOLN
0.0000 [IU] | Freq: Every day | SUBCUTANEOUS | Status: DC
  Administered 2018-11-05: 1 [IU] via SUBCUTANEOUS

## 2018-11-05 NOTE — Progress Notes (Signed)
CRITICAL VALUE ALERT  Critical Value:  K 2.7  Date & Time Notied:  11/04/18, 2130  Provider Notified: Warren Lacy   Orders Received/Actions taken: New orders.

## 2018-11-05 NOTE — Progress Notes (Signed)
[place] NOTE   Pharmacy Consult for TPN Indication: Intolerance to enteral feeding  Patient Measurements: Body mass index is 22.2 kg/m. Filed Weights   11/04/18 0459 11/04/18 0938 11/05/18 0600  Weight: 147 lb 0.8 oz (66.7 kg) 147 lb 0.8 oz (66.7 kg) 141 lb 12.1 oz (64.3 kg)   HPI:  33 yoF admitted on 1/23 with hematemesis.  She developed severe recurrent GIB, s/p EGD on 1/24 and 1/25.  She was unable to have feeding tube placed RN on 1/27 and GI recommended feeding tube to be placed by IR under fluoroscopy as neutropenia improves.  IR was unable to place PEG tube on 1/30.  Pharmacy is now consulted for TPN, with plans to reconsider swallow eval as she gets stronger.  High risk for refeeding syndrome given severe malnutrition, no nutrition for 8 days, poor intake x1 month PTA.  PMH significant for: hiatal hernia, hyperlipidemia, non Hodgkin's lymphoma with most recent chemo given on 10/24/18.  Significant events:  1/31 Extubated, Propofol d/c, Protonix changed to IVP  Insulin requirements past 24 hours: 2 U (no hx DM)  Current Nutrition: NPO   IVF: D5 at 10 ml/hr  Central access: CVC triple lumen, 1/25 TPN start date: 1/31  ASSESSMENT                                                                                                          Today:   Glucose: 129-178, 2U insulin  (goal 100-150),   Electrolytes: Na and Cl WNL, CorrCa low at 8.26, Phos low at 1.3   K 2.9  (s/p KCl x4 runs overnight 1/31-2/1), and Mag 1.9  Continues on Lasix 40 mg IV q12h  Renal:  SCr stable  LFTs:  WNL (1/28)  TGs:  235 (1/31) on Propofol that has been d/c.  No lipids at this time.  Prealbumin:  Pending (2/1)   NUTRITIONAL GOALS                                                                                             RD recs (updated 1/31) Kcal:  2000-2200 kcal Protein:  100-110g Fluid:  1.5L/day  Per MD order and while the patient meets  ICU status the initiation of lipids is being delayed for 7 days or until transition out of ICU. Planned start date for lipids (if meeting MD criteria of completely recovered WBC) is 11/11/2018.  Goal will be to meet 100% of the patient's protein needs and approximately 80% of their caloric needs.   Custom TPN at goal rate of 75 ml/hr provides: - 110 g/day protein (61 g/L) meeting 100% of needs  - 360 g/day Dextrose (  20%)  - 1663 Kcal/day meeting 83% of Kcal needs while holding lipids.  PLAN                                                                                                                         Now:  KCl 12meq x 6 per MD   KPhos 30 mmol (also provides 44 mEq K+) IV x1 dose   At 1800 today:  Continue Custom TPN at 35 ml/hr  Provides 51 g protein and 776 Kcal  NO lipids at this time (start date 11/11/18 if meeting MD criteria of completely recovered WBC)  Elytes:  Remove Na, continue standard K, Ca, Mag, cont Phos 20 mmol/L.  1:2 Cl:Ac ratio  Standard multivitamins and trace element daily.  Plan to advance as tolerated to the goal rate.  continue IVF at 10 ml/hr.  continue CBGs and sensitive SSI q6h.   TPN lab panels on Mondays & Thursdays.  BMET, mag, phos on Saturday/Sunday   [place] 11/05/2018, 8:29 AM Pager [phone]

## 2018-11-05 NOTE — Progress Notes (Signed)
Pt has R IJ Triple lumen central line, placed 10/26/2018;  Per radiology CXR report from 10/31/18, the tip of the line is in the upper SVC, not the distal SVC,;  Pt is currently receiving TNA through this line;  Judson Roch, RN is aware and will notify MD .

## 2018-11-05 NOTE — Progress Notes (Signed)
eLink Physician-Brief Progress Note Patient Name: Patricia Murillo DOB: 08-Sep-1946 MRN: [id number]   Date of Service  11/05/2018  HPI/Events of Note  Multiple issues: 1. Fever to 100.4 F - Patient NPO request for rectal Tylenol and 2. Anxiety - request for Ativan IV.   eICU Interventions  Will order: 1. Tylenol 650 mg PR Q 6 hours PRN Temp > 101.0 F. 2. Versed 0.5 mg IV X 1 now.      Intervention Category Major Interventions: Other:  Lysle Dingwall 11/05/2018, 8:56 PM

## 2018-11-05 NOTE — Progress Notes (Signed)
NAME:  Patricia Murillo, MRN:  [id number], DOB:  Jul 26, 1946, LOS: 9 ADMISSION DATE:  10/21/2018, CONSULTATION DATE:  REFERRING MD:  Dr Wilson Singer, CHIEF COMPLAINT: Hematemesis  Brief History   Patient was brought into the hospital 1/23 for hematemesis this afternoon. She did decompensate overnight 10/28/2018 Aspirated and further decompensated,Intubated and on ventilator at present; PCCM asked to a/w care   Past Medical History  Arthritis, hiatal hernia, non-Hodgkin's lymphoma, hyperlipidemia.  Significant Hospital Events   Has remained edema dynamically stable 1/24-complaining of nausea vomiting, vomited a few times and aspirated, progressed to respiratory failure, required intubation.  Hemodynamic compromise following initial stabilization Significant amount of coffee grounds noted in endotracheal tube following intubation 1/25-weaned off pressors.  Awake, interactive, moving all extremities 1/25 through 1/29: Was extubated on the 26, was reintubated on 27th due to poor ventilatory mechanics and poor cough mechanics.  She had Candida tropicalis growing in her sputum and because she was immunocompromised we opted to start Diflucan as well.  On 1/28 we began to reduce sedation in hopes of readying her for weaning efforts.  Echocardiogram results show new reduced ejection fraction 1/29: Cycling on pressure support ventilation: Escalating diuresis, adding low-dose beta-blockade  1/30: Tolerating pressure support at 10 cm water, still with accessory use.  Titrating up beta-blockade.  Continuing diuresis.  Platelets now greater than 100 so IR was consulted for feeding tube unfortunately they were unable to pass the tube past the hiatal hernia 1/31 extubated. Diuresis continued. PICC line ordered for TPN  Consults:  GI-post endoscopy significant for multiple ulcers-nonbleeding Repeat endoscopy on 10/14/2018-showed gastric edema, hemospray.  Large amounts of altered blood noted in the  stomach  Procedures:  10/07/2018 right IJ placed 10/27/2018 endotracheal tube placed  Significant Diagnostic Tests:  Last CT chest was 09/07/2018 showing progression of lymphoma, multiple masses and extensive adenopathy CT abdomen 08/31/2018 also did reveal extensive adenopathy Chest x-ray on 10/07/2018-no new infiltrative process, large hiatal hernia, tube appropriately positioned Chest x-ray post line placement noted-no pneumothorax, fluffy infiltrate on left TTE 1/27 >> normal LV size, systolic function mild to moderately reduced, EF 40 to 45% (decreased from 55 to 60% 08/31/2018)  Micro Data:  Respiratory 1/25 >> Candida tropicalis  Antimicrobials:  Zosyn 10/30/2018>>1/31 Fluconazole 1/27 >>   Interim history/subjective:  Extubated successfully Coughing up her secretions Has had some sacral pain but currently is pain-free Hypokalemia noted   Objective   Blood pressure 132/70, pulse (!) 101, temperature 98 F (36.7 C), temperature source Oral, resp. rate (!) 31, height 5\' 7"  (1.702 m), weight 64.3 kg, SpO2 95 %.        Intake/Output Summary (Last 24 hours) at 11/05/2018 0813 Last data filed at 11/05/2018 5035 Gross per 24 hour  Intake 2677.26 ml  Output 5100 ml  Net -2422.74 ml   Filed Weights   11/04/18 0459 11/04/18 0938 11/05/18 0600  Weight: 66.7 kg 66.7 kg 64.3 kg    Examination: General: Ill-appearing elderly woman, no distress lying on her left side HEENT oropharynx clear, no thrush, flat neck veins Pulmonary: Mostly clear, no wheezing, decreased at both bases Cardiac: Regular, borderline tachycardic, no murmur Abdomen: Soft, nondistended, positive bowel sounds Extremities: 1+ lower extremity edema Neuro: Awake, oriented, moves all extremities, globally weak but nothing focal GU: Clear yellow  Resolved Hospital Problem list   Hypocalcemia Shock, likely hemorrhagic plus component of septic  Assessment & Plan:  Acute hypoxemic respiratory failure, secondary  to aspiration pneumonia and poor ventilatory and cough mechanics  Sputum growing Candida tropicalis requiring reintubation 1/27 Review of her portable chest x-ray shows significant improvement with bilateral aeration. She is passed her spontaneous breathing trial Plan Continue to push pulmonary hygiene. Hopefully we can get her up out of bed with PT assistance Zosyn completed, day 6 of 7 fluconazole.  Plan to DC on 2/2 since her white count has rebounded   Acute blood loss anemia due to upper GI bleed from bleeding gastric ulcers -Gastric biopsies without any evidence for lymphoma in the gastric lining -Hemoglobin stable Plan Continue PPI twice daily  New left ventricular dysfunction by echocardiogram 1/27 ejection fraction is now 40 to 45%.  Consider due to chemotherapy, consider due to contribution of sepsis Plan Tolerating diuresis, only issue is been hypokalemia.  Plan to continue Once we confirm that she is taking good oral intake we will convert her beta-blockade to carvedilol.  Severe protein calorie malnutrition, has a history of a hiatal hernia.   -We were unable to advance regular nasogastric tube even with radiology assistance, unfortunately she has been nothing by mouth for several days now. Plan  Continue partial TPN for now Can probably add lipids soon given her rebounding white blood cell count We will try clears and see if she tolerates  fluid and electrolyte imbalance: Hypernatremia, hypokalemia Plan Change D5W to [facility] Replace potassium aggressively Follow BMP  Hypoglycemia -Blood glucose remains in the 70s Plan D10 has been stopped now that she is on parenteral nutrition   B-cell lymphoma, received chemotherapy week prior to admission Plan  Appreciate Dr. Ernestina Penna assistance.  Chemotherapy on hold until she can recover from this illness, may require surgical debridement of her sacral wound.  Note that she will ultimately need a port placement PET scan per  outpatient schedule  Neutropenia due to chemotherapy, improved Reviewed Dr. Fritz Pickerel notes, no role for repeat dosing G-CSF (given on 1/22) G-CSF per onc (given 1/22) Plan Follow CBC  Sacral pressure ulcers: Seen by wound ostomy nurse, this is deemed a non-healable ulcer. Plan Continue current wound care Maximize nutrition as were able She will likely need surgery and plastic surgery evaluation for definitive therapy when she is recovered and able to tolerate  History of third-degree heart block Has a permanent pacemaker Plan On telemetry   Best practice:  Diet: started partial TPN 1/31, try clears 2/1 Pain/Anxiety/Delirium protocol (if indicated): Propofol, fentanyl: Discontinued 1/31 VAP protocol (if indicated): Discontinued 1/31 DVT prophylaxis: SCD, GI bleed GI prophylaxis: On Protonix drip: changed to twice daily 1/31 Glucose control: ssi Mobility: Bedrest: Out of bed 1/31 Code Status: Full code Family Communication: Changed to stepdown status on 2/1.  Possibly out of the unit and to hospitalist service 2/2  Baltazar Apo, MD, PhD 11/05/2018, 8:23 AM [facility] [phone] or if no answer [phone]

## 2018-11-05 NOTE — Evaluation (Signed)
Physical Therapy Evaluation Patient Details Name: Patricia Murillo MRN: [id number] DOB: 03/07/1946 Today's Date: 11/05/2018   History of Present Illness  73 year old woman with non-Hodgkins lymphoma on chemotherapy.  Admitted with pancytopenia, profound GI bleeding.  EGD showed gastric ulcer disease but no evidence for gastric lymphoma.  Course complicated by aspiration pneumonitis/pneumonia, septic shock (now improved).  She has also been found to have a new cardiomyopathy, question due to chemotherapy.    Clinical Impression  Pt admitted with above diagnosis. Pt currently with functional limitations due to the deficits listed below (see PT Problem List). Mod assist for sidelying to sit, pt sat on edge of bed for ~2 minutes, then became lightheaded and fatigued so was assisted back to bed. HR 117 at rest, 120 with activity, BP 120/57 in sitting. SaO2 90% on room air with activity.  Pt will benefit from skilled PT to increase their independence and safety with mobility to allow discharge to the venue listed below.       Follow Up Recommendations SNF    Equipment Recommendations  None recommended by PT    Recommendations for Other Services       Precautions / Restrictions Precautions Precautions: Fall Precaution Comments: sacral wound Restrictions Weight Bearing Restrictions: No      Mobility  Bed Mobility Overal bed mobility: Needs Assistance Bed Mobility: Rolling;Sidelying to Sit;Sit to Supine Rolling: Mod assist Sidelying to sit: Mod assist   Sit to supine: Total assist;+2 for physical assistance   General bed mobility comments: assist to initiate roll, VCs for technique, assist to raise trunk sidelying to sit; total assist sit to supine 2* lightheadedness and fatigue after sitting at EOB for ~ 2 minutes  SaO2 90% on room air with activity, BP 120/57 sitting, HR 117 at rest and 120 with activity.   Transfers                 General transfer comment: unable 2*  fatigue from sitting edge of bed; pt declined maxisky transfer to recliner 2* fatigue  Ambulation/Gait                Stairs            Wheelchair Mobility    Modified Rankin (Stroke Patients Only)       Balance Overall balance assessment: Needs assistance Sitting-balance support: Feet supported;Bilateral upper extremity supported Sitting balance-Leahy Scale: Poor Sitting balance - Comments: relies on BUE support       Standing balance comment: unable                             Pertinent Vitals/Pain Pain Assessment: 0-10 Pain Score: 7  Pain Location: sacral wound Pain Descriptors / Indicators: Sore Pain Intervention(s): Limited activity within patient's tolerance;Monitored during session;Premedicated before session;Repositioned    Home Living Family/patient expects to be discharged to:: Skilled nursing facility                 Additional Comments: from SNF, she stated she was able to stand and propel WC there, lift was used for transfers    Prior Function Level of Independence: Needs assistance   Gait / Transfers Assistance Needed: was able to walk with RW ~2 months ago, able to stand with assist and propel WC at SNF PTA  ADL's / Homemaking Assistance Needed: assist needed        Hand Dominance   Dominant Hand: Right    Extremity/Trunk Assessment  Upper Extremity Assessment Upper Extremity Assessment: Generalized weakness;Defer to OT evaluation(LUE forearm edema)    Lower Extremity Assessment Lower Extremity Assessment: Generalized weakness(B knee ext -3/5)    Cervical / Trunk Assessment Cervical / Trunk Assessment: Normal  Communication   Communication: Expressive difficulties(pt whispers, difficult to hear at times)  Cognition Arousal/Alertness: Awake/alert Behavior During Therapy: WFL for tasks assessed/performed Overall Cognitive Status: Within Functional Limits for tasks assessed                                         General Comments      Exercises General Exercises - Lower Extremity Ankle Circles/Pumps: AROM;Both;5 reps;Supine Heel Slides: AAROM;Both;5 reps;Supine   Assessment/Plan    PT Assessment Patient needs continued PT services  PT Problem List Decreased strength;Decreased activity tolerance;Decreased mobility;Decreased balance;Pain       PT Treatment Interventions Gait training;Functional mobility training;Therapeutic exercise;Patient/family education;Wheelchair mobility training;DME instruction    PT Goals (Current goals can be found in the Care Plan section)  Acute Rehab PT Goals Patient Stated Goal: likes to quilt PT Goal Formulation: With patient Time For Goal Achievement: 11/19/18 Potential to Achieve Goals: Fair    Frequency Min 2X/week   Barriers to discharge        Co-evaluation               AM-PAC PT "6 Clicks" Mobility  Outcome Measure Help needed turning from your back to your side while in a flat bed without using bedrails?: A Lot Help needed moving from lying on your back to sitting on the side of a flat bed without using bedrails?: A Lot Help needed moving to and from a bed to a chair (including a wheelchair)?: Total Help needed standing up from a chair using your arms (e.g., wheelchair or bedside chair)?: Total Help needed to walk in hospital room?: Total Help needed climbing 3-5 steps with a railing? : Total 6 Click Score: 8    End of Session Equipment Utilized During Treatment: Gait belt Activity Tolerance: Patient limited by pain;Patient limited by fatigue Patient left: in bed;with bed alarm set;with nursing/sitter in room;with call bell/phone within reach Nurse Communication: Mobility status;Need for lift equipment PT Visit Diagnosis: Muscle weakness (generalized) (M62.81);Difficulty in walking, not elsewhere classified (R26.2);Pain    Time: 8588-5027 PT Time Calculation (min) (ACUTE ONLY): 40 min   Charges:   PT  Evaluation $PT Eval Moderate Complexity: 1 Mod PT Treatments $Therapeutic Exercise: 8-22 mins $Neuromuscular Re-education: 8-22 mins        Blondell Reveal Kistler PT 11/05/2018  Acute Rehabilitation Services Pager [phone] Office [phone]

## 2018-11-05 NOTE — Evaluation (Signed)
Clinical/Bedside Swallow Evaluation Patient Details  Name: Patricia Murillo MRN: [id number] Date of Birth: [birth year]  Today's Date: 11/05/2018 Time: SLP Start Time (ACUTE ONLY): 7867 SLP Stop Time (ACUTE ONLY): 1702 SLP Time Calculation (min) (ACUTE ONLY): 12 min  Past Medical History:  Past Medical History:  Diagnosis Date  . Arthritis   . Hiatal hernia   . History of chicken pox   . Hyperlipidemia   . Non Hodgkin's lymphoma ([place])   . Pacemaker    Past Surgical History:  Past Surgical History:  Procedure Laterality Date  . ANKLE SURGERY    . BIOPSY  10/12/2018   Procedure: BIOPSY;  Surgeon: Wonda Horner, MD;  Location: [facility];  Service: Endoscopy;;  . ESOPHAGOGASTRODUODENOSCOPY N/A 10/20/2018   Procedure: ESOPHAGOGASTRODUODENOSCOPY (EGD);  Surgeon: Otis Brace, MD;  Location: [facility];  Service: Gastroenterology;  Laterality: N/A;  . ESOPHAGOGASTRODUODENOSCOPY (EGD) WITH PROPOFOL N/A 10/25/2018   Procedure: ESOPHAGOGASTRODUODENOSCOPY (EGD) WITH PROPOFOL;  Surgeon: Wonda Horner, MD;  Location: [facility];  Service: Endoscopy;  Laterality: N/A;  possible control of hemorrhage  . PACEMAKER IMPLANT N/A 04/18/2018   Procedure: PACEMAKER IMPLANT;  Surgeon: Evans Lance, MD;  Location: [facility];  Service: Cardiovascular;  Laterality: N/A;   HPI:  73 y.o. 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 Pt presented with hematemesis 10/17/2018, decompensated overnight, aspirated and was intubated twice: 1/25-1/26, and then 1/27-1/31/20. EGD 11/03/2018 revealed hematin (altered blood/coffee-ground-like material) in the gastric body, Non-obstructing oozing gastric ulcers, blood in the entire examined duodenum. Unable to place NG due to hiatal hernia. Has PICC line for TPN. Pt followed by SLP during recent admission; MBS  09/12/18 revealed moderate-severe sensorimotor oropharyngeal dysphagia, repeat MBS 09/15/18 showed improvements (mild oropharyngeal dysphagia) and pt advanced to regular solids, thin liquids.    Assessment / Plan / Recommendation Clinical Impression   Pt presents with signs of oropharyngeal dysphagia, with wet vocal quality, coughing, and wheezing R vs L (RN listening to upper airway sounds) after ice chips. Given pt's history of moderate-severe dysphagia with decompensation, esophageal/GI issues, and prolonged intubation, she is at high risk for aspiration. Instrumental testing recommended to aid in determining safety for initiating oral diet; will follow for readiness for MBS when appropriate from a GI standpoint. Recommend NPO, with frequent oral care. Medications via alternative means.   SLP Visit Diagnosis: Dysphagia, oropharyngeal phase (R13.12)    Aspiration Risk  Severe aspiration risk    Diet Recommendation NPO;Alternative means - temporary        Other  Recommendations Oral Care Recommendations: Oral care QID   Follow up Recommendations Other (comment)(tba)      Frequency and Duration min 2x/week  2 weeks       Prognosis Prognosis for Safe Diet Advancement: Good Barriers to Reach Goals: Other (Comment)      Swallow Study   General Date of Onset: 11/01/2018 HPI: 73 y.o. 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 Pt presented with hematemesis 11/04/2018, decompensated overnight, aspirated and was intubated twice: 1/25-1/26, and then 1/27-1/31/20. EGD 10/14/2018 revealed hematin (altered blood/coffee-ground-like material) in the gastric body, Non-obstructing oozing gastric ulcers, blood in the entire examined duodenum. Unable to place NG due to hiatal hernia. Has PICC line for TPN. Pt followed by SLP during recent  admission; MBS  09/12/18 revealed moderate-severe sensorimotor oropharyngeal dysphagia, repeat MBS 09/15/18 showed improvements (mild oropharyngeal dysphagia) and pt advanced to regular solids, thin liquids.  Type of Study: Bedside Swallow Evaluation Previous Swallow Assessment: see HPI Diet Prior to this Study: NPO;TNA Temperature Spikes Noted: No Respiratory Status: Nasal cannula History of Recent Intubation: Yes Length of Intubations (days): 7 days(2 intubations) Date extubated: 11/04/18 Behavior/Cognition: Alert;Cooperative Oral Cavity Assessment: Within Functional Limits Oral Care Completed by SLP: Yes Oral Cavity - Dentition: Adequate natural dentition Self-Feeding Abilities: Needs assist Patient Positioning: Upright in bed Baseline Vocal Quality: Aphonic Volitional Cough: Weak Volitional Swallow: Able to elicit    Oral/Motor/Sensory Function Overall Oral Motor/Sensory Function: Generalized oral weakness   Ice Chips Ice chips: Impaired Presentation: Spoon Pharyngeal Phase Impairments: Wet Vocal Quality;Cough - Immediate;Change in Vital Signs(wheezing)   Thin Liquid Thin Liquid: Not tested    Nectar Thick Nectar Thick Liquid: Not tested   Honey Thick Honey Thick Liquid: Not tested   Puree Puree: Not tested   Solid     Solid: Not tested     Deneise Lever, MS, CCC-SLP Speech-Language Pathologist Acute Rehabilitation Services Pager: [phone] Office: [phone]  Aliene Altes 11/05/2018,5:15 PM

## 2018-11-05 DEATH — deceased

## 2018-11-06 ENCOUNTER — Inpatient Hospital Stay (HOSPITAL_COMMUNITY): Payer: Medicare HMO

## 2018-11-06 LAB — GLUCOSE, CAPILLARY
GLUCOSE-CAPILLARY: 110 mg/dL — AB (ref 70–99)
Glucose-Capillary: 107 mg/dL — ABNORMAL HIGH (ref 70–99)
Glucose-Capillary: 121 mg/dL — ABNORMAL HIGH (ref 70–99)
Glucose-Capillary: 111 mg/dL — ABNORMAL HIGH (ref 70–99)

## 2018-11-06 LAB — MAGNESIUM
Magnesium: 1.7 mg/dL (ref 1.7–2.4)
Magnesium: 1.8 mg/dL (ref 1.7–2.4)

## 2018-11-06 LAB — BASIC METABOLIC PANEL
Anion gap: 11 (ref 5–15)
Anion gap: 9 (ref 5–15)
BUN: 13 mg/dL (ref 8–23)
BUN: 15 mg/dL (ref 8–23)
CALCIUM: 7.1 mg/dL — AB (ref 8.9–10.3)
CO2: 25 mmol/L (ref 22–32)
CO2: 27 mmol/L (ref 22–32)
Calcium: 7.2 mg/dL — ABNORMAL LOW (ref 8.9–10.3)
Chloride: 105 mmol/L (ref 98–111)
Chloride: 106 mmol/L (ref 98–111)
Creatinine, Ser: 0.44 mg/dL (ref 0.44–1.00)
Creatinine, Ser: 0.4 mg/dL — ABNORMAL LOW (ref 0.44–1.00)
GFR calc Af Amer: >60 mL/min (ref >60)
GFR calc Af Amer: >60 mL/min (ref >60)
GFR calc non Af Amer: >60 mL/min (ref >60)
GFR calc non Af Amer: >60 mL/min (ref >60)
Glucose, Bld: 133 mg/dL — ABNORMAL HIGH (ref 70–99)
Glucose, Bld: 124 mg/dL — ABNORMAL HIGH (ref 70–99)
POTASSIUM: 3.7 mmol/L (ref 3.5–5.1)
Potassium: 3.8 mmol/L (ref 3.5–5.1)
Sodium: 141 mmol/L (ref 135–145)
Sodium: 142 mmol/L (ref 135–145)

## 2018-11-06 LAB — CBC
HCT: 34.6 % — ABNORMAL LOW (ref 36.0–46.0)
Hemoglobin: 10.7 g/dL — ABNORMAL LOW (ref 12.0–15.0)
MCH: 28.5 pg (ref 26.0–34.0)
MCHC: 30.9 g/dL (ref 30.0–36.0)
MCV: 92 fL (ref 80.0–100.0)
Platelets: 373 10*3/uL (ref 150–400)
RBC: 3.76 MIL/uL — ABNORMAL LOW (ref 3.87–5.11)
RDW: 17.2 % — ABNORMAL HIGH (ref 11.5–15.5)
WBC: 29.6 10*3/uL — ABNORMAL HIGH (ref 4.0–10.5)
nRBC: 0 % (ref 0.0–0.2)

## 2018-11-06 LAB — PHOSPHORUS
Phosphorus: 2.5 mg/dL (ref 2.5–4.6)
Phosphorus: 2.3 mg/dL — ABNORMAL LOW (ref 2.5–4.6)

## 2018-11-06 MED ORDER — POTASSIUM PHOSPHATES 15 MMOLE/5ML IV SOLN
10.0000 mmol | Freq: Once | INTRAVENOUS | Status: AC
  Administered 2018-11-06: 10 mmol via INTRAVENOUS
  Filled 2018-11-06: qty 3.33

## 2018-11-06 MED ORDER — MAGNESIUM SULFATE IN D5W 1-5 GM/100ML-% IV SOLN
1.0000 g | Freq: Once | INTRAVENOUS | Status: AC
  Administered 2018-11-06: 1 g via INTRAVENOUS
  Filled 2018-11-06: qty 100

## 2018-11-06 MED ORDER — TRAVASOL 10 % IV SOLN
INTRAVENOUS | Status: AC
  Administered 2018-11-06: 17:00:00 via INTRAVENOUS
  Filled 2018-11-06: qty 512.4

## 2018-11-06 MED ORDER — LORAZEPAM 2 MG/ML IJ SOLN
0.5000 mg | Freq: Four times a day (QID) | INTRAMUSCULAR | Status: DC | PRN
  Administered 2018-11-06 – 2018-11-07 (×2): 0.5 mg via INTRAVENOUS
  Filled 2018-11-06 (×2): qty 1

## 2018-11-06 NOTE — Progress Notes (Signed)
[facility] Progress Note Patient Name: LEVERNE TESSLER DOB: [birth year] MRN: [id number]   Date of Service  11/06/2018  HPI/Events of Note  Increased RR from 31 to low 40's Sat = 98%.  eICU Interventions  Will order a trial of BiPAP increase TV and decrease RR and WOB.       Intervention Category Major Interventions: Other:  Lysle Dingwall 11/06/2018, 11:35 PM

## 2018-11-06 NOTE — Progress Notes (Signed)
NAME:  Patricia Murillo, MRN:  [id number], DOB:  [birth year], LOS: 23 ADMISSION DATE:  11/03/2018, CONSULTATION DATE:  REFERRING MD:  Dr Wilson Singer, CHIEF COMPLAINT: Hematemesis  Brief History   Patient was brought into the hospital 1/23 for hematemesis this afternoon. She did decompensate overnight 11/02/2018 Aspirated and further decompensated,Intubated and on ventilator at present; PCCM asked to a/w care   Past Medical History  Arthritis, hiatal hernia, non-Hodgkin's lymphoma, hyperlipidemia.  Significant Hospital Events   Has remained edema dynamically stable 1/24-complaining of nausea vomiting, vomited a few times and aspirated, progressed to respiratory failure, required intubation.  Hemodynamic compromise following initial stabilization Significant amount of coffee grounds noted in endotracheal tube following intubation 1/25-weaned off pressors.  Awake, interactive, moving all extremities 1/25 through 1/29: Was extubated on the 26, was reintubated on 27th due to poor ventilatory mechanics and poor cough mechanics.  She had Candida tropicalis growing in her sputum and because she was immunocompromised we opted to start Diflucan as well.  On 1/28 we began to reduce sedation in hopes of readying her for weaning efforts.  Echocardiogram results show new reduced ejection fraction 1/29: Cycling on pressure support ventilation: Escalating diuresis, adding low-dose beta-blockade  1/30: Tolerating pressure support at 10 cm water, still with accessory use.  Titrating up beta-blockade.  Continuing diuresis.  Platelets now greater than 100 so IR was consulted for feeding tube unfortunately they were unable to pass the tube past the hiatal hernia 1/31 extubated. Diuresis continued. PICC line ordered for TPN  Consults:  GI-post endoscopy significant for multiple ulcers-nonbleeding Repeat endoscopy on 11/03/2018-showed gastric edema, hemospray.  Large amounts of altered blood noted in the  stomach  Procedures:  10/22/2018 right IJ placed 10/05/2018 endotracheal tube placed  Significant Diagnostic Tests:  Last CT chest was 09/07/2018 showing progression of lymphoma, multiple masses and extensive adenopathy CT abdomen 08/31/2018 also did reveal extensive adenopathy Chest x-ray on 10/05/2018-no new infiltrative process, large hiatal hernia, tube appropriately positioned Chest x-ray post line placement noted-no pneumothorax, fluffy infiltrate on left TTE 1/27 >> normal LV size, systolic function mild to moderately reduced, EF 40 to 45% (decreased from 55 to 60% 08/31/2018)  Micro Data:  Respiratory 1/25 >> Candida tropicalis  Antimicrobials:  Zosyn 10/25/2018>>1/31 Fluconazole 1/27 >> 2/2  Interim history/subjective:  Oropharyngeal dysphasia noted on swallowing evaluation 2/1.  She had some cough, tachypnea and tachycardia after receiving Jell-O, persisted for several hours. She received single dose Versed for anxiety overnight No evidence of blood loss  Objective   Blood pressure 118/66, pulse (!) 107, temperature 97.7 F (36.5 C), temperature source Oral, resp. rate (!) 30, height 5\' 7"  (1.702 m), weight 63.3 kg, SpO2 98 %.        Intake/Output Summary (Last 24 hours) at 11/06/2018 0754 Last data filed at 11/06/2018 0700 Gross per 24 hour  Intake 1501.14 ml  Output 2695 ml  Net -1193.86 ml   Filed Weights   11/04/18 0938 11/05/18 0600 11/06/18 0449  Weight: 66.7 kg 64.3 kg 63.3 kg    Examination: General: Ill-appearing elderly woman, no distress, resting HEENT oropharynx clear.  She coughs and it sounds like she moves secretions but she does not get them all the way up to her mouth Pulmonary: Coarse bilaterally, no wheezing Cardiac: Regular tachycardic, no murmur Abdomen: Soft, nondistended, positive bowel sounds Extremities: 1-2+ lower extremity edema Neuro: Awake, oriented, soft voice, moves all extremities, globally weak GU: Clear yellow  Resolved Hospital  Problem list   Hypocalcemia Shock, likely  hemorrhagic plus component of septic Hypoglycemia Neutropenia  Assessment & Plan:  Acute hypoxemic respiratory failure, secondary to aspiration pneumonia and poor ventilatory and cough mechanics  Sputum growing Candida tropicalis requiring reintubation 1/27 Chest x-ray 2/2 reviewed by me, shows hiatal hernia, right lung clear, retrocardiac atelectasis with a possible small effusion Plan Continue to push pulmonary hygiene Mobilize as able-she is profoundly weak and will need full assist  Hopefully we can get her up out of bed with PT assistance Zosyn completed, day 7 of 7 fluconazole.  Plan to DC on 2/2 since her white count has rebounded   Acute blood loss anemia due to upper GI bleed from bleeding gastric ulcers -Gastric biopsies without any evidence for lymphoma in the gastric lining -Hemoglobin stable Plan Continue PPI twice daily Follow CBC and for any evidence of bleeding  New left ventricular dysfunction by echocardiogram 1/27 ejection fraction is now 40 to 45%.  Consider due to chemotherapy, consider due to contribution of sepsis Plan Continue current Lasix dosing Continue beta-blockade, convert to p.o. carvedilol when she is able to tolerate  Severe protein calorie malnutrition, has a history of a hiatal hernia.   -We were unable to advance regular nasogastric tube even with radiology assistance, unfortunately she has been nothing by mouth for several days now. Plan  Okay to change partial TPN to include lipids at this point.  Will notify pharmacy Needs strict n.p.o., likely will need a modified barium swallow this week per SLP  fluid and electrolyte imbalance: Hypernatremia, hypokalemia Plan [facility]W to [facility] Continue to replace potassium Follow BMP  B-cell lymphoma, received chemotherapy week prior to admission Plan  Followed by Dr. Burr Medico.  Chemotherapy will be on hold until she can recover from this illness.  She may require  surgical debridement of her sacral wound before they can proceed. Ultimately she will need a port placement and PET scan as an outpatient  Neutropenia due to chemotherapy, improved Reviewed Dr. Ernestina Penna notes, no role for repeat dosing G-CSF (given on 1/22) G-CSF per onc (given 1/22) Plan Continue to follow CBC  Sacral pressure ulcers: Seen by wound ostomy nurse, this is deemed a non-healable ulcer. Plan Continue current wound care Maximize nutritional support She will likely need surgery and plastic surgery evaluation for definitive therapy when she has recovered and would be able to tolerate  History of third-degree heart block Has a permanent pacemaker Plan Telemetry monitoring  Anxiety Plan Low-dose Ativan as needed ordered on 2/2   Best practice:  Diet: started partial TPN 1/31, try clears 2/1 Pain/Anxiety/Delirium protocol (if indicated): Propofol, fentanyl: Discontinued 1/31 VAP protocol (if indicated): Discontinued 1/31 DVT prophylaxis: SCD, GI bleed GI prophylaxis: On Protonix drip: changed to twice daily 1/31 Glucose control: ssi Mobility: Bedrest: Out of bed 1/31 Code Status: Full code Family Communication: Changed to stepdown status on 2/1.  Possibly out of the unit and to hospitalist service 2/2  Baltazar Apo, MD, PhD 11/06/2018, 7:54 AM [facility] [phone] or if no answer [phone]

## 2018-11-06 NOTE — Progress Notes (Signed)
[facility] NOTE   Pharmacy Consult for TPN Indication: Intolerance to enteral feeding  Patient Measurements: Body mass index is 21.86 kg/m. Filed Weights   11/04/18 0938 11/05/18 0600 11/06/18 0449  Weight: 147 lb 0.8 oz (66.7 kg) 141 lb 12.1 oz (64.3 kg) 139 lb 8.8 oz (63.3 kg)   HPI:  57 yoF admitted on 1/23 with hematemesis.  She developed severe recurrent GIB, s/p EGD on 1/24 and 1/25.  She was unable to have feeding tube placed RN on 1/27 and GI recommended feeding tube to be placed by IR under fluoroscopy as neutropenia improves.  IR was unable to place PEG tube on 1/30.  Pharmacy is now consulted for TPN, with plans to reconsider swallow eval as she gets stronger.  High risk for refeeding syndrome given severe malnutrition, no nutrition for 8 days, poor intake x1 month PTA.  PMH significant for: hiatal hernia, hyperlipidemia, non Hodgkin's lymphoma with most recent chemo given on 10/24/18.  Significant events:  1/31 Extubated, Propofol d/c, Protonix changed to IVP  Insulin requirements past 24 hours: none (no hx DM)  Current Nutrition: NPO   IVF: D5 at 10 ml/hr  Central access: CVC triple lumen, 1/25 TPN start date: 1/31  ASSESSMENT                                                                                                          Today:   Glucose: at goal, no insulin  (goal 100-150),   Electrolytes: Na and Cl WNL, CorrCa low at 8.56, Phos low at 2.3   K 3.7  (s/p KCl x6 runs + 74mmol KPhos yest), and Mag 1.8  Continues on Lasix 40 mg IV q12h  Renal:  SCr stable  LFTs:  WNL (1/28)  TGs:  235 (1/31) on Propofol that has been d/c.  No lipids at this time.  Prealbumin:  Pending (2/1)   NUTRITIONAL GOALS                                                                                             RD recs (updated 1/31) Kcal:  2000-2200 kcal Protein:  100-110g Fluid:  1.5L/day  Per MD order and while the patient  meets ICU status the initiation of lipids is being delayed for 7 days or until transition out of ICU. Planned start date for lipids (if meeting MD criteria of completely recovered WBC) is 11/11/2018.  Goal will be to meet 100% of the patient's protein needs and approximately 80% of their caloric needs.   Custom TPN at goal rate of 75 ml/hr provides: - 110 g/day protein (61 g/L) meeting 100% of needs  - 360  g/day Dextrose (20%)  - 1663 Kcal/day meeting 83% of Kcal needs while holding lipids.  PLAN                                                                                                                         Now:  Magnesium Sulfate 1gm x 1  KPhos 10 mmol (also provides 14.5 mEq K+) IV x1 dose  At 1800 today:  Continue Custom TPN at 35 ml/hr  Provides 51 g protein and 776 Kcal  NO lipids at this time (start date 11/11/18 if meeting MD criteria of completely recovered WBC)  Elytes:  Remove Na, continue standard K, Ca, Mag, cont Phos 20 mmol/L.  1:2 Cl:Ac ratio  Standard multivitamins and trace element daily.  Plan to advance as tolerated to the goal rate.  continue IVF at 10 ml/hr.  continue CBGs and sensitive SSI q6h.   TPN lab panels on Mondays & Thursdays.   Dolly Rias RPh 11/06/2018, 7:33 AM Pager [phone]

## 2018-11-06 NOTE — Progress Notes (Signed)
Requested to assess CVC due to occluded port.  Cap changed, unable to flush at all.  RN, pt and family at bedside aware.  RN to notify Dr Lamonte Sakai of need for IR placed PICC.

## 2018-11-07 DIAGNOSIS — Z7189 Other specified counseling: Secondary | ICD-10-CM

## 2018-11-07 DIAGNOSIS — L89159 Pressure ulcer of sacral region, unspecified stage: Secondary | ICD-10-CM

## 2018-11-07 DIAGNOSIS — Z950 Presence of cardiac pacemaker: Secondary | ICD-10-CM

## 2018-11-07 DIAGNOSIS — Z515 Encounter for palliative care: Secondary | ICD-10-CM

## 2018-11-07 DIAGNOSIS — D61818 Other pancytopenia: Secondary | ICD-10-CM

## 2018-11-07 LAB — DIFFERENTIAL
Abs Immature Granulocytes: 4.23 10*3/uL — ABNORMAL HIGH (ref 0.00–0.07)
Basophils Absolute: 0.1 10*3/uL (ref 0.0–0.1)
Basophils Relative: 0 %
Eosinophils Absolute: 0 10*3/uL (ref 0.0–0.5)
Eosinophils Relative: 0 %
Immature Granulocytes: 13 %
Lymphocytes Relative: 1 %
Lymphs Abs: 0.4 10*3/uL — ABNORMAL LOW (ref 0.7–4.0)
MONOS PCT: 4 %
Monocytes Absolute: 1.2 10*3/uL — ABNORMAL HIGH (ref 0.1–1.0)
NEUTROS PCT: 82 %
Neutro Abs: 26 10*3/uL — ABNORMAL HIGH (ref 1.7–7.7)

## 2018-11-07 LAB — COMPREHENSIVE METABOLIC PANEL
ALT: 11 U/L (ref 0–44)
AST: 23 U/L (ref 15–41)
Albumin: 2.4 g/dL — ABNORMAL LOW (ref 3.5–5.0)
Alkaline Phosphatase: 159 U/L — ABNORMAL HIGH (ref 38–126)
Anion gap: 11 (ref 5–15)
BUN: 18 mg/dL (ref 8–23)
CO2: 27 mmol/L (ref 22–32)
Calcium: 7.5 mg/dL — ABNORMAL LOW (ref 8.9–10.3)
Chloride: 103 mmol/L (ref 98–111)
Creatinine, Ser: 0.6 mg/dL (ref 0.44–1.00)
GFR calc Af Amer: >60 mL/min (ref >60)
GFR calc non Af Amer: >60 mL/min (ref >60)
Glucose, Bld: 144 mg/dL — ABNORMAL HIGH (ref 70–99)
Potassium: 3.6 mmol/L (ref 3.5–5.1)
Sodium: 141 mmol/L (ref 135–145)
Total Bilirubin: 0.7 mg/dL (ref 0.3–1.2)
Total Protein: 5.6 g/dL — ABNORMAL LOW (ref 6.5–8.1)

## 2018-11-07 LAB — MAGNESIUM: Magnesium: 2 mg/dL (ref 1.7–2.4)

## 2018-11-07 LAB — CBC
HCT: 34.7 % — ABNORMAL LOW (ref 36.0–46.0)
Hemoglobin: 10.5 g/dL — ABNORMAL LOW (ref 12.0–15.0)
MCH: 28.4 pg (ref 26.0–34.0)
MCHC: 30.3 g/dL (ref 30.0–36.0)
MCV: 93.8 fL (ref 80.0–100.0)
Platelets: 439 10*3/uL — ABNORMAL HIGH (ref 150–400)
RBC: 3.7 MIL/uL — ABNORMAL LOW (ref 3.87–5.11)
RDW: 17.2 % — ABNORMAL HIGH (ref 11.5–15.5)
WBC: 32 10*3/uL — ABNORMAL HIGH (ref 4.0–10.5)
nRBC: 0 % (ref 0.0–0.2)

## 2018-11-07 LAB — PREALBUMIN: Prealbumin: 14.1 mg/dL — ABNORMAL LOW (ref 18–38)

## 2018-11-07 LAB — GLUCOSE, CAPILLARY: Glucose-Capillary: 124 mg/dL — ABNORMAL HIGH (ref 70–99)

## 2018-11-07 LAB — PHOSPHORUS: PHOSPHORUS: 2.4 mg/dL — AB (ref 2.5–4.6)

## 2018-11-07 LAB — TRIGLYCERIDES: Triglycerides: 384 mg/dL — ABNORMAL HIGH (ref <150)

## 2018-11-07 MED ORDER — GLYCOPYRROLATE 1 MG PO TABS: 1.0000 mg | ORAL_TABLET | ORAL | Status: DC | PRN

## 2018-11-07 MED ORDER — MORPHINE SULFATE (PF) 2 MG/ML IV SOLN: 2.0000 mg | INTRAVENOUS | Status: DC | PRN

## 2018-11-07 MED ORDER — INSULIN ASPART 100 UNIT/ML ~~LOC~~ SOLN: 0.0000 [IU] | SUBCUTANEOUS | Status: DC

## 2018-11-07 MED ORDER — MORPHINE 100MG IN NS 100ML (1MG/ML) PREMIX INFUSION
0.0000 mg/h | INTRAVENOUS | Status: DC
  Administered 2018-11-07: 5 mg/h via INTRAVENOUS
  Administered 2018-11-07: 10 mg/h via INTRAVENOUS
  Filled 2018-11-07 (×3): qty 100

## 2018-11-07 MED ORDER — DIPHENHYDRAMINE HCL 50 MG/ML IJ SOLN: 25.0000 mg | INTRAMUSCULAR | Status: DC | PRN

## 2018-11-07 MED ORDER — ACETAMINOPHEN 650 MG RE SUPP: 650.0000 mg | Freq: Four times a day (QID) | RECTAL | Status: DC | PRN

## 2018-11-07 MED ORDER — DEXTROSE 5 % IV SOLN: INTRAVENOUS | Status: DC

## 2018-11-07 MED ORDER — MORPHINE BOLUS VIA INFUSION
5.0000 mg | INTRAVENOUS | Status: DC | PRN
  Filled 2018-11-07: qty 5

## 2018-11-07 MED ORDER — GLYCOPYRROLATE 0.2 MG/ML IJ SOLN: 0.2000 mg | INTRAMUSCULAR | Status: DC | PRN

## 2018-11-07 MED ORDER — GLYCOPYRROLATE 0.2 MG/ML IJ SOLN
0.2000 mg | INTRAMUSCULAR | Status: DC | PRN
  Administered 2018-11-07: 0.2 mg via INTRAVENOUS
  Filled 2018-11-07: qty 1

## 2018-11-07 MED ORDER — POLYVINYL ALCOHOL 1.4 % OP SOLN
1.0000 [drp] | Freq: Four times a day (QID) | OPHTHALMIC | Status: DC | PRN
  Filled 2018-11-07: qty 15

## 2018-11-07 MED ORDER — ACETAMINOPHEN 325 MG PO TABS: 650.0000 mg | ORAL_TABLET | Freq: Four times a day (QID) | ORAL | Status: DC | PRN

## 2018-11-07 MED ORDER — POTASSIUM PHOSPHATES 15 MMOLE/5ML IV SOLN
10.0000 mmol | Freq: Once | INTRAVENOUS | Status: AC
  Administered 2018-11-07: 10 mmol via INTRAVENOUS
  Filled 2018-11-07: qty 3.33

## 2018-11-07 MED ORDER — TRAVASOL 10 % IV SOLN
INTRAVENOUS | Status: DC
  Filled 2018-11-07: qty 1098

## 2018-11-07 MED ORDER — SODIUM CHLORIDE 0.9 % IV SOLN
INTRAVENOUS | Status: DC | PRN
  Administered 2018-11-07: 250 mL via INTRAVENOUS

## 2018-11-07 NOTE — Progress Notes (Addendum)
NAME:  ADRINE HAYWORTH, MRN:  [id number], DOB:  August 07, 1946, LOS: 21 ADMISSION DATE:  10/10/2018, CONSULTATION DATE:  REFERRING MD:  Dr Wilson Singer, CHIEF COMPLAINT: Hematemesis  Brief History   73 y/o F admitted 1/23 with hematemesis.  Decompensated overnight 1/25 with aspiration.  Intubated.  EGD with multiple non-bleeding ulcers.  Failed extubation 1/26, reintubated 1/27.  Sputum growing candida tropicalis (treated with anitfungal).  Found to have new reduction of LVEF. Extubated 1/31 after diuresis. IR unable to place PEG due to hiatal hernia.     Past Medical History  Arthritis, hiatal hernia, non-Hodgkin's lymphoma, hyperlipidemia.  Significant Hospital Events   1/24 c/o nausea/vomiting, concern for aspiration, resp fx > intubation  1/25 Off pressors  1/26 Extubated  1/27 Re-intubated w/ poor cough mechanics, candida tropicalis (sputum) 1/28 Sedation reduced for PSV, ECHO with new reduced LVEF  1/29 PSV, diuresing, low dose beta blocker initiated  1/30 Tolerating PSV 10, IR could not do PEG due to hiatal hernia  1/31 Extubated, PICC ordered for TPN  2/01 SLP with oropharyngeal dysphasia, cough after   Consults:  GI-post endoscopy significant for multiple ulcers-nonbleeding Repeat endoscopy on 10/25/2018-showed gastric edema, hemospray.  Large amounts of altered blood noted in the stomach  Procedures:  R IJ 1/25 >>  ETT 1/25 >> 1/26, 1/27 >> 1/31   Significant Diagnostic Tests:  CT chest 09/07/2018 showing progression of lymphoma, multiple masses and extensive adenopathy CT abdomen 08/31/2018 also did reveal extensive adenopathy Chest x-ray on 11/01/2018-no new infiltrative process, large hiatal hernia, tube appropriately positioned TTE 1/27 >> normal LV size, systolic function mild to moderately reduced, EF 40 to 45% (decreased from 55 to 60% 08/31/2018)  Micro Data:  Respiratory 1/25 >> Candida tropicalis  Antimicrobials:  Zosyn 1/25 >>1/31 Fluconazole 1/27 >> 2/2  Interim  history/subjective:  Tmax 102.6.  Daughters at bedside.  Asking about meds for comfort.  Pt tachypneic on bipap.  Daughters indicate that she would not want to be reintubated but want to talk about it with her.   Objective   Blood pressure 126/62, pulse (!) 32, temperature (!) 101.5 F (38.6 C), temperature source Axillary, resp. rate (!) 30, height 5\' 7"  (1.702 m), weight 63.5 kg, SpO2 96 %.    Vent Mode: BIPAP FiO2 (%):  [40 %] 40 % Set Rate:  [8 bmp-12 bmp] 12 bmp PEEP:  [5 cmH20] 5 cmH20 Pressure Support:  [5 cmH20] 5 cmH20   Intake/Output Summary (Last 24 hours) at 11/07/2018 0808 Last data filed at 11/07/2018 0600 Gross per 24 hour  Intake 1769.87 ml  Output 1950 ml  Net -180.13 ml   Filed Weights   11/05/18 0600 11/06/18 0449 11/07/18 0500  Weight: 64.3 kg 63.3 kg 63.5 kg    Examination: General: chronically ill appearing female lying in bed on BiPAP.  Appears uncomfortable.    HEENT: MM pink/dry Neuro: pt looks at provider when speaking, closes eyes, turns over CV: s1s2 rrr, no m/r/g PULM: even/non-labored, lungs bilaterally clear anterior, diminished bases  LO:VFIE, non-tender, bsx4 active  Extremities: warm/dry, trace generalized edema  Skin: no rashes or lesions.  UE's discolored   Resolved Hospital Problem list   Hypocalcemia Shock, likely hemorrhagic plus component of septic Hypoglycemia Neutropenia  Assessment & Plan:   Acute hypoxemic respiratory failure, secondary to aspiration pneumonia and poor ventilatory and cough mechanics  Sputum growing Candida tropicalis P: Continue PRN BiPAP Reviewed work of breathing with family > they want to discuss  FiO2 as needed to support  sats >90%  ABLA due to upper GI bleed from bleeding gastric ulcers -Gastric biopsies without any evidence for lymphoma in the gastric lining -Hemoglobin stable P: Trend CBC  BID PPI  Monitor for bleeding   New left ventricular dysfunction by echocardiogram 1/27 ejection fraction  is now 40 to 45%.   -Consider due to chemotherapy, contribution of sepsis P: Lasix 40 mg BID  Continue lopressor 2.5 mg IV Q6   Severe protein calorie malnutrition, has a history of a hiatal hernia.  -we were unable to advance regular nasogastric tube even with radiology assistance, unfortunately she has been nothing by mouth for several days now. P: TPN per pharmacy  NPO  Not a candidate for further SLP at this point due to resp pattern   Hypernatremia, hypokalemia P: Trend BMP / urinary output Replace electrolytes as indicated Avoid nephrotoxic agents, ensure adequate renal perfusion  B-cell lymphoma, received chemotherapy week prior to admission -followed by Dr. Burr Medico, chemo on hold due to acute illness.  P: Will need Port at some point + PET scan   Neutropenia due to chemotherapy, improved -reviewed Dr. Ernestina Penna notes, no role for repeat dosing G-CSF (given on 1/22) G-CSF per onc (given 1/22) P: Follow CBC  ABX/antifungals stopped 2/2 > WBC increased, ? Restart abx  Sacral pressure ulcers -seen by wound ostomy nurse, this is deemed a non-healable ulcer P: Appreciate WOC  Maximize nutritional support  Will likely need surgical debridement at some point   History of third-degree heart block - has permanent pacemaker P: Tele monitoring  Anxiety P: PRN ativan, fentanyl   GOC -discussed work of breathing with daughters at bedside.  They indicate she has stated she would not want further intubation but they wanted to talk to her about it.  They are hoping to give her a break off bipap and talk to her.  They are also asking about medications for comfort.  We reviewed the trade off with comfort meds to include sedation, low BP etc.  Additionally, that we can make her more comfortable but need to clarify decision regarding intubation / path.     Best practice:  Diet: started partial TPN 1/31  Pain/Anxiety/Delirium protocol (if indicated): in place  VAP protocol (if  indicated): n/a DVT prophylaxis: SCD, GI bleed GI prophylaxis:BID PPI Glucose control: SSI Mobility: Bedrest: As tolerated  Code Status: Full code Family Communication: SDU status.     Noe Gens, NP-C [facility] Pgr: [phone] or if no answer [phone] 11/07/2018, 8:22 AM  Attending Note:  73 year old female with lymphoma and in septic shock from a sacral decub.  Patient is clearly in respiratory failure on exam.  I am concerned for her airway protection at this point.  I do not believe patient will survive long without intubation and I do not believe that intubation is the correct course of action given chronic health conditions.  I spoke with the daughters at length.  They would like to focus more on comfort at this point.  Will place morphine drip orders and make patient DNR and focus more on comfort at this point.  The patient is critically ill with multiple organ systems failure and requires high complexity decision making for assessment and support, frequent evaluation and titration of therapies, application of advanced monitoring technologies and extensive interpretation of multiple databases.   Critical Care Time devoted to patient care services described in this note is  38  Minutes. This time reflects time of care  of this signee Dr Jennet Maduro. This critical care time does not reflect procedure time, or teaching time or supervisory time of PA/NP/Med student/Med Resident etc but could involve care discussion time.  Rush Farmer, M.D. [facility]Critical Care Medicine. Pager: [phone]. After hours pager: [phone].

## 2018-11-07 NOTE — Progress Notes (Signed)
*  Late Entry*  50 mL IV Fentanyl wasted with [employer], RN - 11/04/18 1200

## 2018-11-07 NOTE — Care Management Note (Signed)
Case Management Note  Patient Details  Name: Patricia Murillo MRN: [id number] Date of Birth: 1946-07-18  Subjective/Objective:                  Hemoptysis and upper gi bleed unable find mcg for dz.  Temp 102.6 he=129/ on bipap at 40%/ rr=40. Discharge Readiness Return to top of Sepsis and Other Febrile Illness, without Focal Infection RRG - [place]  Discharge readiness is indicated by patient meeting Recovery Milestones, including ALL of the following: ? Hemodynamic stability ? Fever absent or reduced temp 102.6 ? Hypoxemia absent on bipap ? Mental status at baseline yes ? End-organ dysfunction (eg, myocardial ischemia, renal failure) absent/YES  ? Metabolic derangement (eg, dehydration, acidosis) absent YES ? Cultures negative or infection identified and under adequate treatment WBC=32.0/ HGB 10.5 ? Ambulatory NO ? Oral hydration, medications[P] ? IV TPN(ION) IV KPO4 ? LEVEL OF CARSDU  Action/Plan: Following for progression  Following for cm needs  Expected Discharge Date:  (unknown)               Expected Discharge Plan:     In-House Referral:     Discharge planning Services     Post Acute Care Choice:    Choice offered to:     DME Arranged:    DME Agency:     HH Arranged:    HH Agency:     Status of Service:     If discussed at [employer], dates discussed:    Additional Comments:  Leeroy Cha, RN 11/07/2018, 10:04 AM

## 2018-11-07 NOTE — Progress Notes (Signed)
Patricia Murillo   DOB:[birth year]   KG#:[id number]   JSE#:[id number]  Hematology and oncology follow-up note  Subjective: Pt has deteriorated over the weekend, developed fever, dyspnea and hypoxia, required BiPAP overnight.  She was not comfortable with BiPAP, patient declined reintubation, and a decision has made to change her to comfort care.  She was lethargic, was on nonrebreather when I saw her, she tried to remove it, and changed to nasal cannula.  I spoke with her daughters at bedside.  Objective:  Vitals:   11/07/18 1200 11/07/18 1236  BP: (!) 106/55   Pulse: (!) 122 (!) 127  Resp: (!) 38 (!) 37  Temp:    SpO2: 99% (!) 80%    Body mass index is 21.93 kg/m.  Intake/Output Summary (Last 24 hours) at 11/07/2018 1752 Last data filed at 11/07/2018 1630 Gross per 24 hour  Intake 1542.65 ml  Output 1950 ml  Net -407.35 ml    Pt lethargic, physical exam was not performed today.   CBG (last 3)  Recent Labs    11/06/18 1610 11/06/18 2144 11/07/18 0738  GLUCAP 111* 110* 124*     Labs:  Lab Results  Component Value Date   WBC 32.0 (H) 11/07/2018   HGB 10.5 (L) 11/07/2018   HCT 34.7 (L) 11/07/2018   MCV 93.8 11/07/2018   PLT 439 (H) 11/07/2018   NEUTROABS 26.0 (H) 11/07/2018   CMP Latest Ref Rng & Units 11/07/2018 11/06/2018 11/05/2018  Glucose 70 - 99 mg/dL 144(H) 124(H) 133(H)  BUN 8 - 23 mg/dL 18 15 13   Creatinine 0.44 - 1.00 mg/dL 0.60 0.40(L) 0.44  Sodium 135 - 145 mmol/L 141 142 141  Potassium 3.5 - 5.1 mmol/L 3.6 3.7 3.8  Chloride 98 - 111 mmol/L 103 106 105  CO2 22 - 32 mmol/L 27 27 25   Calcium 8.9 - 10.3 mg/dL 7.5(L) 7.2(L) 7.1(L)  Total Protein 6.5 - 8.1 g/dL 5.6(L) - -  Total Bilirubin 0.3 - 1.2 mg/dL 0.7 - -  Alkaline Phos 38 - 126 U/L 159(H) - -  AST 15 - 41 U/L 23 - -  ALT 0 - 44 U/L 11 - -     Urine Studies No results for input(s): UHGB, CRYS in the last 72 hours.  Invalid input(s): UACOL, UAPR, USPG, UPH, UTP, UGL, UKET, UBIL, UNIT, UROB, [place],  UEPI, UWBC, [place], UBAC, CAST, [place], [place]  Basic Metabolic Panel: Recent Labs  Lab 11/04/18 0400 11/04/18 2000 11/05/18 0500 11/05/18 2330 11/06/18 0500 11/07/18 0500  NA 147* 145 142 141 142 141  K 3.8 2.7* 2.9* 3.8 3.7 3.6  CL 112* 104 102 105 106 103  CO2 27 30 29 25 27 27   GLUCOSE 79 133* 178* 133* 124* 144*  BUN 13 12 12 13 15 18   CREATININE 0.47 0.42* 0.32* 0.44 0.40* 0.60  CALCIUM 6.9* 7.0* 6.9* 7.1* 7.2* 7.5*  MG 1.8  --  1.9 1.7 1.8 2.0  PHOS 1.7*  --  1.3* 2.5 2.3* 2.4*   GFR Estimated Creatinine Clearance: 62.7 mL/min (by C-G formula based on SCr of 0.6 mg/dL). Liver Function Tests: Recent Labs  Lab 11/01/18 0436 11/05/18 0500 11/07/18 0500  AST 19 20 23   ALT 14 12 11   ALKPHOS 83 117 159*  BILITOT 1.5* 1.1 0.7  PROT 4.6* 5.0* 5.6*  ALBUMIN 2.2* 2.3* 2.4*   No results for input(s): LIPASE, AMYLASE in the last 168 hours. No results for input(s): AMMONIA in the last 168 hours. Coagulation profile Recent  Labs  Lab 11/01/18 0436  INR 1.38    CBC: Recent Labs  Lab 11/02/18 0318  11/03/18 0100 11/03/18 0820 11/05/18 0500 11/06/18 0500 11/07/18 0500  WBC 0.4*  --   --  3.7* 18.0* 29.6* 32.0*  NEUTROABS  --   --   --   --  17.1*  --  26.0*  HGB 9.0*   < > 9.8* 9.9* 10.3* 10.7* 10.5*  HCT 28.3*   < > 31.0* 31.9* 33.9* 34.6* 34.7*  MCV 90.1  --   --  90.1 92.4 92.0 93.8  PLT 69*  --   --  137* 266 373 439*   < > = values in this interval not displayed.   Cardiac Enzymes: No results for input(s): CKTOTAL, CKMB, CKMBINDEX, TROPONINI in the last 168 hours. BNP: Invalid input(s): POCBNP CBG: Recent Labs  Lab 11/06/18 0734 11/06/18 1135 11/06/18 1610 11/06/18 2144 11/07/18 0738  GLUCAP 107* 121* 111* 110* 124*   D-Dimer No results for input(s): DDIMER in the last 72 hours. Hgb A1c No results for input(s): HGBA1C in the last 72 hours. Lipid Profile Recent Labs    11/05/18 0500 11/07/18 0500  TRIG 226* 384*   Thyroid function  studies No results for input(s): TSH, T4TOTAL, T3FREE, THYROIDAB in the last 72 hours.  Invalid input(s): FREET3 Anemia work up No results for input(s): VITAMINB12, FOLATE, FERRITIN, TIBC, IRON, RETICCTPCT in the last 72 hours. Microbiology Recent Results (from the past 240 hour(s))  Culture, respiratory (non-expectorated)     Status: None   Collection Time: 11/04/2018  9:03 AM  Result Value Ref Range Status   Specimen Description   Final    TRACHEAL ASPIRATE Performed at [facility] [address] [place], [place] [address]    Special Requests   Final    NONE Performed at [facility], [place] [address], [place], [place] [address]    Gram Stain   Final    NO WBC SEEN NO SQUAMOUS EPITHELIAL CELLS SEEN MODERATE BUDDING YEAST SEEN RARE GRAM POSITIVE RODS RARE GRAM NEGATIVE RODS Performed at [facility], [address], [place], [place] [address]    Culture MODERATE CANDIDA TROPICALIS  Final   Report Status 10/31/2018 FINAL  Final      Studies:  Dg Chest Port 1 View  Result Date: 11/06/2018 CLINICAL DATA:  Pneumonia EXAM: PORTABLE CHEST 1 VIEW COMPARISON:  11/05/2018 FINDINGS: Lungs are essentially clear. No frank interstitial edema. Mild left basilar opacity likely reflects a combination of atelectasis and small left pleural effusion. Cardiomegaly.  Right chest pacemaker. Right IJ venous catheter terminates in the distal SVC but is poorly visualized. Large hiatal hernia. IMPRESSION: Mild left basilar opacity likely reflects combination of atelectasis and small left pleural effusion. Right IJ venous catheter terminates in distal SVC but is poorly visualized. Electronically Signed   By: Julian Hy M.D.   On: 11/06/2018 05:07    Assessment: 73 y.o. with recently diagnosed non-Hodgkin diffuse large B-cell lymphoma, status post chemotherapy on 10/24/2018, sacral decubitus, complete heart block status post pacemaker, admitted for upper GI  bleeding  1.  Upper GI bleeding secondary to multiple gastric ulcers 2.  Severe pancytopenia, secondary to chemo, much improved  3.  Diffuse large B-cell lymphoma, stage IV, status post 3 cycles of chemotherapy R-CHOP, last cycle on October 24, 2018 4.  Aspiration pneumonia with respiratory failure, intubated 5.  Sacral decubitus 6.  Complete heart block status post pacemaker  Plan:  Although her blood counts have recovered well from chemotherapy, GI bleeding has stopped, she developed worsening respiratory failure again, patient declined reintubation, and family has decided to change comfort care.  Due to her advanced age, malnutrition, suppressed immune system, it would be a very difficult battle for her to fight anyway. I think the decision is reasonable, and I respect her and her family's decision.  I offered emotional support to her daughters, I will be available if needed.   Truitt Merle, MD 11/07/2018

## 2018-11-07 NOTE — Progress Notes (Addendum)
Nutrition Follow-up  DOCUMENTATION CODES:   Severe malnutrition in context of chronic illness  INTERVENTION:  - Continue TPN per Pharmacy. - Will continue to monitor for GOC/POC.  Monitor magnesium, potassium, and phosphorus daily for at least 3 days, MD to replete as needed, as pt is at risk for refeeding syndrome given severe malnutrition, poor PO intakes x1 month PTA, no nutrition for the first week of admission, current slight hypophosphatemia.    NUTRITION DIAGNOSIS:   Severe Malnutrition related to chronic illness, cancer and cancer related treatments as evidenced by percent weight loss, energy intake < or equal to 75% for > or equal to 1 month, moderate fat depletion, severe muscle depletion. -ongoing  GOAL:   Patient will meet greater than or equal to 90% of their needs -unmet with current TPN regimen.   MONITOR:   Diet advancement, Weight trends, Labs, Skin, I & O's, Other (Comment)(TPN regimen)  ASSESSMENT:   73 year old with past medical history relevant for hypertension, pain, non-Hodgkin's lymphoma status post chemotherapy 10/24/2018 with R-CHOP, status post permanent pacemaker, sacral decubitus ulcer admitted from assisted living facility on 10/19/2018 with reports of hematemesis status post EGD showing cratered gastric ulcers.  Weight trending down (-8 lb) compared to admission weight. Patient remains NPO. She has a triple lumen R IJ and on 2/2 she was receiving custom TPN @ 35 ml/hr. Per Pharmacist's note this AM, plan to advance to goal rate of 75 ml/hr today (to include ILE). This regimen will provide 2005 kcal and 110 grams of protein; 100% estimated nutrition needs.   Per Brandi's note this AM: PRN BiPAP, gastric biopsies did not show any evidence of lymphoma in the gastric lining, ECHO 1/27 showed new L ventricular dysfunction, s/p SLP evaluation 2/1 with recommendation for NPO and no further SLP assessments d/t respiratory status, B-cell lymphoma and last chemo was  the week PTA, neutropenia improving. Note states plan for ongoing [place] discussions.   Medications reviewed; 40 mg IV lasix BID, sliding scale novolog, 40 mg IV protonix, 10 mmol IV KPhos x1 run 2/3. Labs reviewed; CBG: 124 mg/dl today, Ca: 7.5 mg/dl, Phos: 2.4 mg/dl, triglycerides: 384 mg/dl today (up from 226 mg/dl on 2/1). IVF; D5 @ 10 ml/hr (41 kcal).     Diet Order:   Diet Order    None      EDUCATION NEEDS:   Education needs have been addressed  Skin:  Skin Assessment: Skin Integrity Issues: Skin Integrity Issues:: Stage III Stage III: sacrum  Last BM:  2/2  Height:   Ht Readings from Last 1 Encounters:  10/31/18 5\' 7"  (1.702 m)    Weight:   Wt Readings from Last 1 Encounters:  11/07/18 63.5 kg    Ideal Body Weight:  61.4 kg  BMI:  Body mass index is 21.93 kg/m.  Estimated Nutritional Needs:   Kcal:  2000-2200 kcal  Protein:  100-110g  Fluid:  1.5L/day      Jarome Matin, MS, RD, LDN, CNSC Inpatient Clinical Dietitian Pager # [phone] After hours/weekend pager # [phone]

## 2018-11-07 NOTE — Progress Notes (Addendum)
[facility] NOTE   Pharmacy Consult for TPN Indication: Intolerance to enteral feeding  Patient Measurements: Body mass index is 21.93 kg/m. Filed Weights   11/05/18 0600 11/06/18 0449 11/07/18 0500  Weight: 141 lb 12.1 oz (64.3 kg) 139 lb 8.8 oz (63.3 kg) 139 lb 15.9 oz (63.5 kg)   HPI:  49 yoF admitted on 1/23 with hematemesis.  She developed severe recurrent GIB, s/p EGD on 1/24 and 1/25.  She was unable to have feeding tube placed RN on 1/27 and GI recommended feeding tube to be placed by IR under fluoroscopy as neutropenia improves.  IR was unable to place PEG tube on 1/30.  Pharmacy is now consulted for TPN, with plans to reconsider swallow eval as she gets stronger.  High risk for refeeding syndrome given severe malnutrition, no nutrition for 8 days, poor intake x1 month PTA.  PMH significant for: hiatal hernia, hyperlipidemia, non Hodgkin's lymphoma with most recent chemo given on 10/24/18.  Significant events:  1/31 Extubated, Propofol d/c, Protonix changed to IVP.  Begin TPN 2/3 Add lipids to TPN today given Stepdown status and per MD notes.   Insulin requirements past 24 hours: 1 unit SSI (no hx DM)  Current Nutrition: NPO   IVF: D5 at 10 ml/hr  Central access: CVC triple lumen, 1/25 - RN reports one occluded port on 2/2, still able to use other 2 ports.  TPN start date: 1/31  ASSESSMENT                                                                                                          Today:   Glucose: at goal, no insulin  (goal 100-150)  Electrolytes: Na, K, Mag, and Cl WNL.  CorrCa low at 8.78, Phos low at 2.4  S/p Mag 1g, KPhos 10 mmol yesterday  Continues on Lasix 40 mg IV q12h  Renal:  SCr stable  LFTs:  WNL (1/28)  TGs:  235 (1/31) while on Propofol.  Increased to 384 (2/3) despite no lipids.  Will need to resume holding lipids if TG > 400  Prealbumin:  10 (2/1), pending (2/3)   NUTRITIONAL GOALS                                                                                              RD recs (updated 1/31) Kcal:  2000-2200 kcal Protein:  100-110g Fluid:  1.5L/day   Custom TPN at goal rate of 75 ml/hr provides: - 110 g/day protein (61 g/L) - 34 g/day Lipid  ( 19 g/L) - 360 g/day Dextrose (20%)  - 2005 Kcal/day   PLAN  Now:  KPhos 10 mmol (also provides 14.5 mEq K+) IV x1 dose  At 1800 today:  Advance to Custom TPN at 75 ml/hr  Elytes:  Low dose Na, continue standard K, Ca, Mag, cont Phos 20 mmol/L.  1:1 Cl:Ac ratio  Standard multivitamins and trace element daily.  Continue IVF at 10 ml/hr.  Continue CBGs and sensitive SSI q6h.   TPN lab panels on Mondays & Thursdays.  F/u BMET, Mag, Phos, TG with AM labs.  F/u plans for enteral feeding, swallow eval.   Gretta Arab PharmD, BCPS Pager [phone] 11/07/2018 7:23 AM

## 2018-11-07 NOTE — Progress Notes (Signed)
Spiritual Care Note  Responded to [facility] page requesting chaplain for family support re anticipatory grief and comfort care. Doll's two daughters were at the bedside, tearfully contacting other family members. They requested prayer for their mom's comfort, and for strength for the rest of the family, including Chee's six grandchildren. Provided empathic pastoral presence, prayer, and a prayer/peace shawl for each daughter as a tangible sign of comfort, care, and encouragement.  At this time, family desires quiet privacy, but is aware of ongoing chaplain availability as needed/desired. Please page anytime. Thank you.   [place], [place], [facility] M-F daytime pager [phone] [facility] 24/7 pager [phone] Voicemail [phone]

## 2018-11-07 NOTE — Progress Notes (Signed)
SLP Cancellation Note  Patient Details Name: Patricia Murillo MRN: [id number] DOB: 1946-03-31   Cancelled treatment:       Reason Eval/Treat Not Completed: Other (comment);Medical issues which prohibited therapy(pt with medical compromise, RR and HR elevated, not appropriate for po at this time)   Macario Golds 11/07/2018, 12:16 PM  Luanna Salk, [place] [facility] SLP [place] Pager [phone] Office [phone]

## 2018-11-07 NOTE — Progress Notes (Signed)
Physical Therapy Discharge Patient Details Name: Patricia Murillo MRN: [id number] DOB: Sep 16, 1946 Today's Date: 11/07/2018 Time:  -     Patient discharged from PT services secondary to medical decline - will need to re-order PT to resume therapy services.Moving to comfort Care per RN.   Please see latest therapy progress note for current level of functioning and progress toward goals.    [place] Pager [phone] Office [phone]  GP     Claretha Cooper 11/07/2018, 11:27 AM

## 2018-11-08 ENCOUNTER — Encounter: Payer: Self-pay | Admitting: Hematology

## 2018-11-09 ENCOUNTER — Telehealth: Payer: Self-pay | Admitting: *Deleted

## 2018-11-09 NOTE — Telephone Encounter (Signed)
Received D/C from [facility] -D/C forwarded to Dr.Byrum to be signed.

## 2018-11-11 NOTE — Telephone Encounter (Signed)
Received signed D/C-[facility] Notified for pick as requested.  

## 2018-11-14 ENCOUNTER — Ambulatory Visit: Payer: Medicare HMO

## 2018-11-14 ENCOUNTER — Ambulatory Visit: Payer: Medicare HMO | Admitting: Hematology

## 2018-11-14 ENCOUNTER — Other Ambulatory Visit: Payer: Medicare HMO

## 2018-11-29 ENCOUNTER — Encounter: Payer: Medicare HMO | Admitting: Internal Medicine

## 2018-12-04 NOTE — Death Summary Note (Signed)
DEATH SUMMARY   Patient Details  Name: Patricia Murillo MRN: [id number] DOB: 1946-04-06  Admission/Discharge Information   Admit Date:  11/09/18  Date of Death: Date of Death: November 21, 2018  Time of Death: Time of Death: 0415  Length of Stay: 12/28/22  Referring Physician: Patient, No Pcp Per   Reason(s) for Hospitalization  Sacral wound infection  Diagnoses  Preliminary cause of death:   Septic shock Secondary Diagnoses (including complications and co-morbidities):  Active Problems:   Complete heart block (HCC)   Lymphoma (HCC)   High grade B-cell lymphoma (HCC)   Upper GI bleed   Acute hypoxemic respiratory failure (HCC)   Protein-calorie malnutrition, severe   Acute blood loss anemia   Brief Hospital Course (including significant findings, care, treatment, and services provided and events leading to death)   73 y/o F admitted November 09, 2022 with hematemesis.  Decompensated overnight 1/25 with aspiration.  Intubated.  EGD with multiple non-bleeding ulcers.  Failed extubation 1/26, reintubated 1/27.  Sputum growing candida tropicalis (treated with anitfungal).  Found to have new reduction of LVEF. Extubated 1/31 after diuresis. IR unable to place PEG due to hiatal hernia.     I do not believe patient will survive long without intubation and I do not believe that intubation is the correct course of action given chronic health conditions.  I spoke with the daughters at length.  They would like to focus more on comfort at this point.  Will place morphine drip orders and make patient DNR and focus more on comfort at this point.  Comfort measures were instituted and patient expired a few hours later comfortable with the family bedside.    Pertinent Labs and Studies  Significant Diagnostic Studies Dg Chest 1 View  Result Date: 10/31/2018 CLINICAL DATA:  Endotracheal tube placement EXAM: CHEST  1 VIEW COMPARISON:  10/31/2018 chest radiograph FINDINGS: Endotracheal tube tip is at the level of  the clavicular heads. Orogastric tube tip and side port project over the left heart, likely within the hiatal hernia. Right internal jugular vein approach central venous catheter tip projects over the upper SVC. Unchanged position of right chest wall AICD leads. Bibasilar atelectasis and pleural effusions. Unchanged left basilar consolidation. IMPRESSION: 1. Endotracheal tube tip at the level of the clavicular heads. 2. Orogastric tube tip and side port project over the left heart, likely within the hiatal hernia. 3. Right IJ approach CVC tip projects over the upper SVC. 4. Left basilar consolidation, bilateral pleural effusions and bibasilar atelectasis. Electronically Signed   By: Ulyses Jarred M.D.   On: 10/31/2018 05:33   Dg Abd 1 View  Result Date: 10/31/2018 CLINICAL DATA:  Advancement of OG tube EXAM: ABDOMEN - 1 VIEW COMPARISON:  Earlier same day; chest radiograph-earlier same day; CT abdomen pelvis-08/31/2018 FINDINGS: Caudal aspect of enteric tube appears coiled over lying the expected location of the known large hiatal hernia. Paucity of bowel gas without evidence of enteric obstruction. Pacer leads overlie the lower chest. Suspected small to moderate size left-sided effusion with associated left basilar opacities, atelectasis versus infiltrate. No supine evidence of pneumoperitoneum. No definite pneumatosis or portal venous gas. No definitive abnormal intra-abdominal calcifications. No acute osseus abnormalities. Degenerative change of the lower lumbar spine. IMPRESSION: 1. Enteric tube appears coiled overlying the known large hiatal hernia demonstrated on CT of the abdomen and pelvis performed 08/31/2018. 2. Small to moderate size left-sided effusion with associated left basilar opacities, atelectasis versus infiltrate. Electronically Signed   By: Eldridge Abrahams.D.  On: 10/31/2018 08:02   Dg Chest Port 1 View  Result Date: 11/06/2018 CLINICAL DATA:  Pneumonia EXAM: PORTABLE CHEST 1 VIEW  COMPARISON:  11/05/2018 FINDINGS: Lungs are essentially clear. No frank interstitial edema. Mild left basilar opacity likely reflects a combination of atelectasis and small left pleural effusion. Cardiomegaly.  Right chest pacemaker. Right IJ venous catheter terminates in the distal SVC but is poorly visualized. Large hiatal hernia. IMPRESSION: Mild left basilar opacity likely reflects combination of atelectasis and small left pleural effusion. Right IJ venous catheter terminates in distal SVC but is poorly visualized. Electronically Signed   By: Julian Hy M.D.   On: 11/06/2018 05:07   Dg Chest Port 1 View  Result Date: 11/05/2018 CLINICAL DATA:  Acute respiratory failure with hypoxia. Non-Hodgkin's lymphoma. EXAM: PORTABLE CHEST 1 VIEW COMPARISON:  11/04/2018 FINDINGS: The patient has been extubated since prior exam. A right IJ central line catheter terminates in the distal SVC. Right-sided pacemaker apparatus with leads in the right atrium and right ventricle are redemonstrated without significant change. Stable mild cardiomegaly with aortic atherosclerosis is identified. Large superimposed hiatal hernia containing air is redemonstrated. There is left basilar atelectasis and possible small effusion accounting for the hazy appearance of the left base. No overt pulmonary edema, pneumothorax or consolidation is otherwise noted. 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 IMPRESSION: 1. Stable cardiomegaly with aortic atherosclerosis. 2. Large superimposed hiatal hernia containing air. 3. Left basilar atelectasis and possible small effusion. No significant change. Electronically Signed   By: Ashley Royalty M.D.   On: 11/05/2018 18:49   Dg Chest Port 1 View  Result Date: 11/04/2018 CLINICAL DATA:  Dysphagia. EXAM: PORTABLE CHEST 1 VIEW COMPARISON:  11/03/2017. FINDINGS: Endotracheal tube, right IJ line in stable position. Cardiac pacer stable position. Stable cardiomegaly.  Stable bilateral infiltrates/edema. Stable left-sided pleural effusion. No pneumothorax. IMPRESSION: 1.  Lines and tubes in stable position. 2.  Cardiac pacer stable position.  Stable cardiomegaly. 3. Bilateral pulmonary infiltrates/edema and small left pleural effusion again noted. Bibasilar atelectasis. No significant change from prior exam. Electronically Signed   By: Marcello Moores  Register   On: 11/04/2018 05:43   Dg Chest Port 1 View  Result Date: 11/03/2018 CLINICAL DATA:  Pulmonary edema. EXAM: PORTABLE CHEST 1 VIEW COMPARISON:  11/01/2018 FINDINGS: Endotracheal tube terminates 3.7 cm above the carina. Right jugular catheter terminates over the lower SVC. Pacemaker remains in place. The cardiac silhouette remains enlarged. Perihilar opacities have improved. There is also improved aeration of the right lung base. There is persistent opacity in the left lung base which may reflect a small pleural effusion with atelectasis or consolidation. Minimal hazy opacity in the right lung base may reflect a small residual pleural effusion as well. No pneumothorax is identified. IMPRESSION: Improved lung aeration with decreased pulmonary edema. Persistent small left pleural effusion with left basilar atelectasis or consolidation. Electronically Signed   By: Logan Bores M.D.   On: 11/03/2018 06:44   Dg Chest Port 1 View  Result Date: 11/01/2018 CLINICAL DATA:  Pulmonary edema EXAM: PORTABLE CHEST 1 VIEW COMPARISON:  Yesterday FINDINGS: Endotracheal tube tip below the clavicular heads. Dual-chamber pacer leads in stable position. Right IJ central line with tip at the SVC. An orogastric tube has been removed. Haziness of the lower chest attributed to pleural fluid and presumed atelectasis. No evident pneumothorax. Stable cardiomegaly. Perihilar edema. IMPRESSION: Unchanged pleural effusions, pulmonary edema, and presumed atelectasis. Electronically Signed   By: Monte Fantasia M.D.   On: 11/01/2018  06:05   Dg Chest Port  1 View  Result Date: 10/31/2018 CLINICAL DATA:  Shortness of breath EXAM: PORTABLE CHEST 1 VIEW COMPARISON:  10/30/2018, 11/01/2018, 09/21/2018 FINDINGS: Right-sided pacing device as before. Small moderate bilateral pleural effusions with bibasilar airspace disease. Cardiomegaly. Central vascular congestion. Large hiatal hernia. IMPRESSION: 1. Small moderate bilateral pleural effusions, new on the right side with worsened airspace disease at the right base and persistent dense left lung base consolidation. 2. Cardiomegaly with vascular congestion 3. Large hiatal hernia Electronically Signed   By: Donavan Foil M.D.   On: 10/31/2018 02:03   Dg Chest Port 1 View  Result Date: 10/30/2018 CLINICAL DATA:  Respiratory failure with aspiration pneumonia and hematemesis. EXAM: PORTABLE CHEST 1 VIEW COMPARISON:  10/22/2018 FINDINGS: ET tube tip is above the carina. Right IJ catheter tip is projecting over the SVC. Right chest wall pacer device is noted with leads in the right atrial appendage and right ventricle. Stable heart size. Large hiatal hernia appears unchanged. Airspace disease within the left midlung and left base is again identified and appears unchanged. Pulmonary edema is decreased in the interval. IMPRESSION: 1. Persistent left midlung and left base airspace densities. 2. Decrease in pulmonary edema. Electronically Signed   By: Kerby Moors M.D.   On: 10/30/2018 11:02   Dg Chest Port 1 View  Result Date: 10/22/2018 CLINICAL DATA:  Central line placement. Former smoker. History of non-Hodgkin's lymphoma. EXAM: PORTABLE CHEST 1 VIEW COMPARISON:  11/04/2018. FINDINGS: Right chest wall pacer device is noted with leads in the right atrial appendage and right ventricle. Heart size appears normal. ET tube tip is above the carina. There is an enteric tube with tip in large hiatal hernia. Interval placement of right IJ catheter. The tip is in the projection of the SVC. No pneumothorax identified. Left midlung  and left lower lobe airspace opacities identified, increased from previous exam. IMPRESSION: 1. No pneumothorax after right IJ catheter placement. The tip projects over the SVC. 2. Worsening aeration to the left midlung and left base. Electronically Signed   By: Kerby Moors M.D.   On: 10/10/2018 09:24   Dg Chest Port 1 View  Result Date: 11/02/2018 CLINICAL DATA:  Endotracheal tube placement. EXAM: PORTABLE CHEST 1 VIEW COMPARISON:  Chest x-rays dated 10/17/2018 and 09/21/2017. FINDINGS: Endotracheal tube appears well positioned with tip approximately 4-5 cm above the level of the carina. RIGHT chest wall pacemaker leads appear stable in position. Stable cardiomegaly. Again noted is a large hiatal hernia. Probable atelectasis and/or small pleural effusion at the LEFT lung base. Lungs otherwise clear. Lucency at the RIGHT lung apex is similar to previous study in presumed to be due to overlying skin fold. No evidence of pneumothorax seen. IMPRESSION: 1. Endotracheal tube well positioned with tip approximately 4-5 cm above the level of the carina. 2. Small pleural effusion and/or atelectasis at the LEFT lung base. Lungs otherwise clear. No evidence of pneumonia or pulmonary edema. 3. Stable cardiomegaly. 4. Large hiatal hernia. Electronically Signed   By: Franki Cabot M.D.   On: 10/07/2018 05:22   Dg Chest Portable 1 View  Result Date: 10/22/2018 CLINICAL DATA:  Vomiting blood today abdominal pain. History of lymphoma. Chemotherapy 2 days ago. EXAM: PORTABLE CHEST 1 VIEW COMPARISON:  09/21/2018 and 09/17/2018, chest CT 109/05/202019 FINDINGS: Patient is rotated to the left. Lungs are adequately inflated with persistent hazy opacification over the left base/retrocardiac region likely left effusion with associated basilar atelectasis. Infection in the left base is  possible. Right lung is clear. Stable moderate cardiomegaly. Large hiatal hernia unchanged. Remainder of the exam is unchanged. IMPRESSION:  Opacification over the left base/retrocardiac region without significant change likely small to moderate effusion with associated atelectasis. Infection in the left base is possible. Stable cardiomegaly. Large hiatal hernia. Electronically Signed   By: Marin Olp M.D.   On: 11/03/2018 14:38   Dg Abd Portable 1v  Result Date: 11/03/2018 CLINICAL DATA:  Nasogastric tube placement EXAM: PORTABLE ABDOMEN - 1 VIEW COMPARISON:  Portable exam 1059 hours compared to 10/31/2018 FINDINGS: Tip of feeding tube projects over the inferior LEFT mediastinum, likely within hiatal hernia. Decreased LEFT basilar atelectasis versus consolidation with persistent pleural effusion. Bowel gas pattern normal. RIGHT lung base grossly clear. Bones demineralized. IMPRESSION: Tip of feeding tube projects over hiatal hernia, above diaphragm. Electronically Signed   By: Lavonia Dana M.D.   On: 11/03/2018 11:34   Dg Abd Portable 1v  Result Date: 10/31/2018 CLINICAL DATA:  Feeding tube placement EXAM: PORTABLE ABDOMEN - 1 VIEW COMPARISON:  Portable exam 1135 hours compared to 0741 hours Correlation: CT abdomen and pelvis 08/31/2018 FINDINGS: Tip of feeding tube projects over the expected position of the LEFT diaphragm; patient has a large hiatal hernia, feeding tube likely within stomach in the inferior chest. Pacemaker leads unchanged. Enlargement of cardiac silhouette. Atelectasis versus consolidation LEFT lower lobe. Visualized bowel gas pattern normal. IMPRESSION: Tip of feeding tube projects over the LEFT diaphragm in a patient with a known large hiatal hernia,; suspect feeding tube is within the hiatal hernia. Electronically Signed   By: Lavonia Dana M.D.   On: 10/31/2018 12:24   Dg Abd Portable 1v  Result Date: 10/31/2018 CLINICAL DATA:  OG tube placement EXAM: PORTABLE ABDOMEN - 1 VIEW COMPARISON:  10/22/2018; chest radiograph - earlier same day; CT abdomen and pelvis - 08/31/2018 FINDINGS: Enteric tube not identified, however  overlies expected location of the large hiatal hernia on chest radiograph performed earlier same day. Paucity of bowel gas without evidence of enteric obstruction. Pacer leads overlie the lower chest. No supine evidence of pneumoperitoneum. No definite pneumatosis or portal venous gas. No definitive abnormal intra-abdominal calcifications. No acute osseus abnormalities. Degenerative change of the lower lumbar spine. IMPRESSION: Enteric tube not identified, however overlies the expected location of the known large hiatal hernia demonstrated on chest radiograph performed earlier same day. Large hiatal hernia was noted to contain near the entirety of the stomach on CT the abdomen pelvis performed 08/31/2018. Electronically Signed   By: Sandi Mariscal M.D.   On: 10/31/2018 08:00   Dg Abd Portable 1v  Result Date: 10/24/2018 CLINICAL DATA:  Orogastric tube placement EXAM: PORTABLE ABDOMEN - 1 VIEW COMPARISON:  08/31/2018 abdominal CT FINDINGS: Orogastric tube which loops through a hiatal hernia, tip and side port at the level of the diaphragm. Hazy lower lung opacities better evaluated on dedicated chest x-ray. The endotracheal tube tip is seen above the carina. Nonobstructive bowel gas pattern. IMPRESSION: Orogastric tube tip in the stomach (which is intrathoracic from large hiatal hernia). Electronically Signed   By: Monte Fantasia M.D.   On: 10/16/2018 07:54   Dg Intro Long Gi Tube  Result Date: 11/03/2018 CLINICAL DATA:  Attempt to advance feeding tube into duodenum. EXAM: INTRO LONG GI TUBE CONTRAST:  35mL ISOVUE-300 IOPAMIDOL (ISOVUE-300) INJECTION 61% FLUOROSCOPY TIME:  Fluoroscopy Time:  2 minutes and 35 seconds Radiation Exposure Index (if provided by the fluoroscopic device): Not applicable. Number of Acquired Spot Images: 1  COMPARISON:  Chest CT of 1Sep 02, 202019. FINDINGS: Attempt was made to advance the feeding tube from the hiatal hernia into the subdiaphragmatic stomach. Small amount of contrast was  administered (50 cc of Isovue 300). The contrast did not pass into the subdiaphragmatic stomach. Despite extensive manipulation, the feeding tube could not be advanced into the subdiaphragmatic stomach. It was left within the hiatal hernia. IMPRESSION: Unsuccessful advancement of the feeding tube into the subdiaphragmatic stomach. Electronically Signed   By: Abigail Miyamoto M.D.   On: 11/03/2018 16:25   [place] Ekg Site Rite  Result Date: 11/04/2018 If Site Rite image not attached, placement could not be confirmed due to current cardiac rhythm.   Microbiology No results found for this or any previous visit (from the past 240 hour(s)).  Lab Basic Metabolic Panel: Recent Labs  Lab 11/04/18 0400 11/04/18 2000 11/05/18 0500 11/05/18 2330 11/06/18 0500 11/07/18 0500  NA 147* 145 142 141 142 141  K 3.8 2.7* 2.9* 3.8 3.7 3.6  CL 112* 104 102 105 106 103  CO2 27 30 29 25 27 27   GLUCOSE 79 133* 178* 133* 124* 144*  BUN 13 12 12 13 15 18   CREATININE 0.47 0.42* 0.32* 0.44 0.40* 0.60  CALCIUM 6.9* 7.0* 6.9* 7.1* 7.2* 7.5*  MG 1.8  --  1.9 1.7 1.8 2.0  PHOS 1.7*  --  1.3* 2.5 2.3* 2.4*   Liver Function Tests: Recent Labs  Lab 11/05/18 0500 11/07/18 0500  AST 20 23  ALT 12 11  ALKPHOS 117 159*  BILITOT 1.1 0.7  PROT 5.0* 5.6*  ALBUMIN 2.3* 2.4*   No results for input(s): LIPASE, AMYLASE in the last 168 hours. No results for input(s): AMMONIA in the last 168 hours. CBC: Recent Labs  Lab 11/05/18 0500 11/06/18 0500 11/07/18 0500  WBC 18.0* 29.6* 32.0*  NEUTROABS 17.1*  --  26.0*  HGB 10.3* 10.7* 10.5*  HCT 33.9* 34.6* 34.7*  MCV 92.4 92.0 93.8  PLT 266 373 439*   Cardiac Enzymes: No results for input(s): CKTOTAL, CKMB, CKMBINDEX, TROPONINI in the last 168 hours. Sepsis Labs: Recent Labs  Lab 11/05/18 0500 11/06/18 0500 11/07/18 0500  WBC 18.0* 29.6* 32.0*    Procedures/Operations     Mikal Wisman 11/10/2018, 3:42 PM

## 2018-12-04 NOTE — Progress Notes (Signed)
This RN witnessed Barbara May, RN waste 37 ml of Morphine one mg/ml infusion; disposed in steri cycle bin

## 2018-12-04 NOTE — Care Management Note (Signed)
Case Management Note  Patient Details  Name: Patricia Murillo MRN: [id number] Date of Birth: 17-Sep-1946  Subjective/Objective:                  discharged  Action/Plan: Expired [id number]  Expected Discharge Date:  (unknown)               Expected Discharge Plan:  Expired  In-House Referral:     Discharge planning Services  CM Consult  Post Acute Care Choice:    Choice offered to:     DME Arranged:    DME Agency:     HH Arranged:    [place] Agency:     Status of Service:  Completed, signed off  If discussed at [employer] of Stay Meetings, dates discussed:    Additional Comments:  Leeroy Cha, RN 12/04/18, 9:23 AM

## 2018-12-04 NOTE — Progress Notes (Signed)
Wasted 37 ml of Morphine one  mg/ml infusion with Vista Mink RN, disposed in [place] cycle bin.

## 2018-12-04 DEATH — deceased

## 2020-09-29 IMAGING — CT CT ANGIO CHEST
2 of 5 series · 16 of 36 positions shown · IV contrast (APPLIED)
Comparison: CXR 08/29/2018

CLINICAL DATA: Severe left arm swelling since yesterday. Hemoglobin
L5.

EXAM:
CT ANGIOGRAPHY CHEST WITH CONTRAST
TECHNIQUE: Multidetector CT imaging of the chest was performed using the
standard protocol during bolus administration of intravenous
contrast. Multiplanar CT image reconstructions and MIPs were
obtained to evaluate the vascular anatomy.
CONTRAST:  100 cc BB3JPV-W5U IOPAMIDOL (BB3JPV-W5U) INJECTION 76%

[Series 3: thorax 3.0 i31f 2 · axial · 0.71mm/px · z∈[+1164,+1434]mm · 15 of 98 slices shown]
[im 4/98  lung]
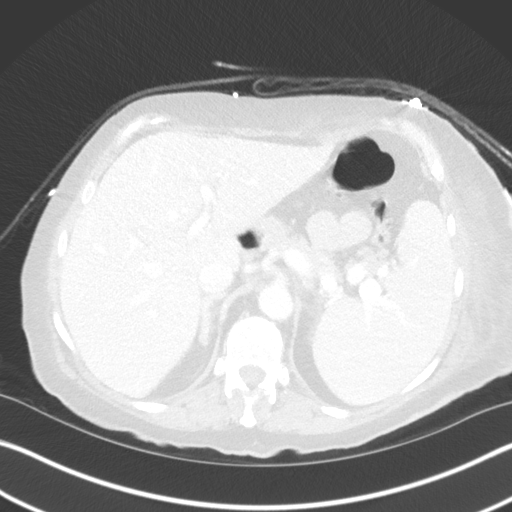
[im 11/98  mediastinal]
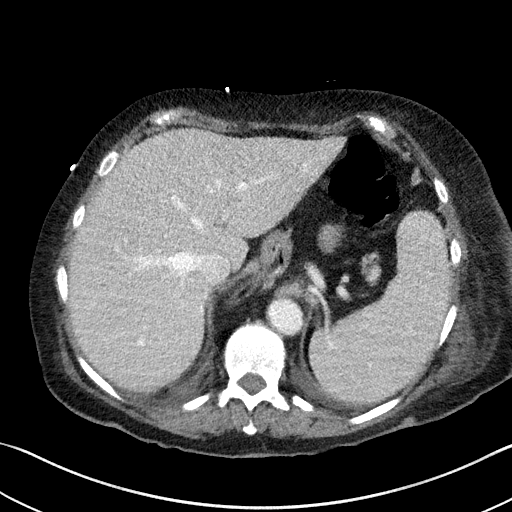
[im 18/98  lung]
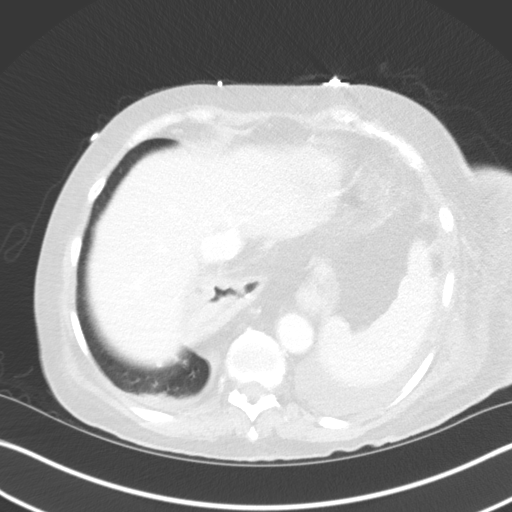
[im 26/98  mediastinal]
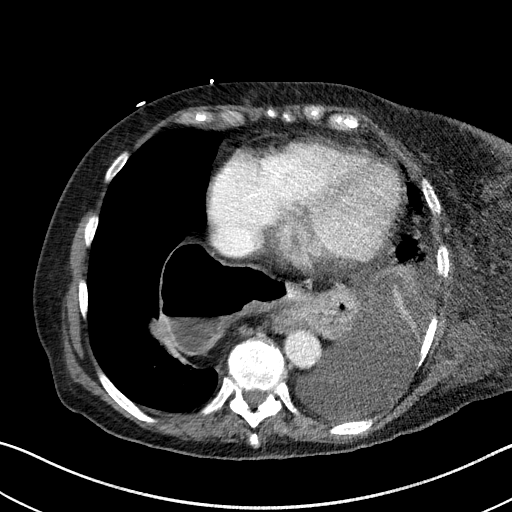
[im 29/98  lung]
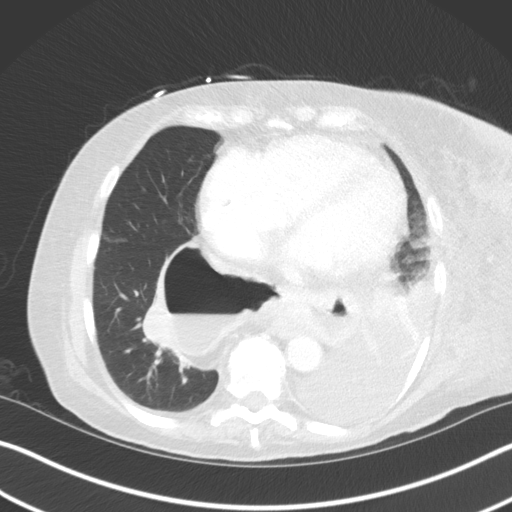
[im 36/98  mediastinal]
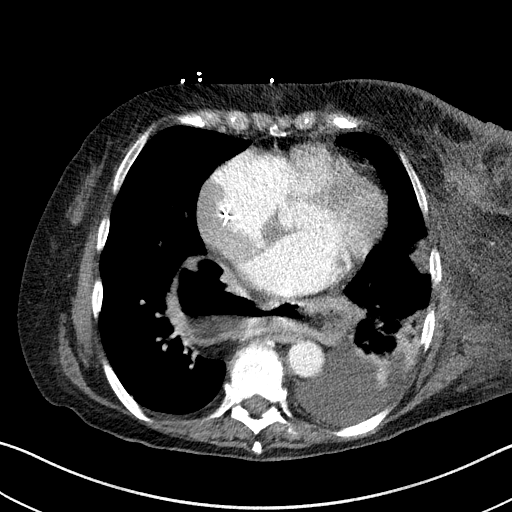
[im 44/98  lung]
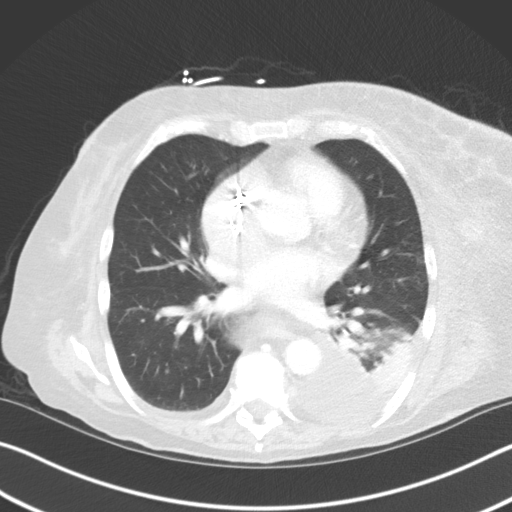
[im 51/98  mediastinal]
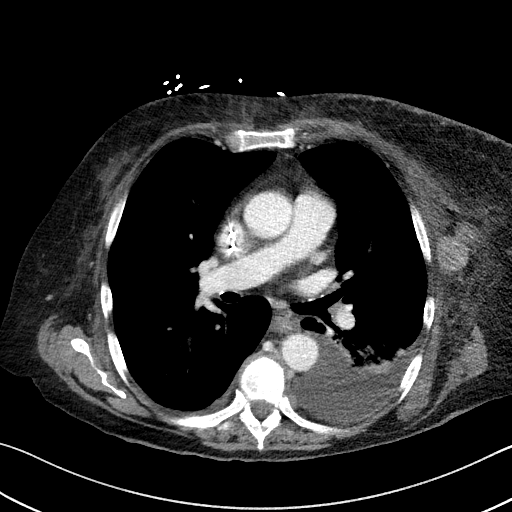
[im 54/98  lung]
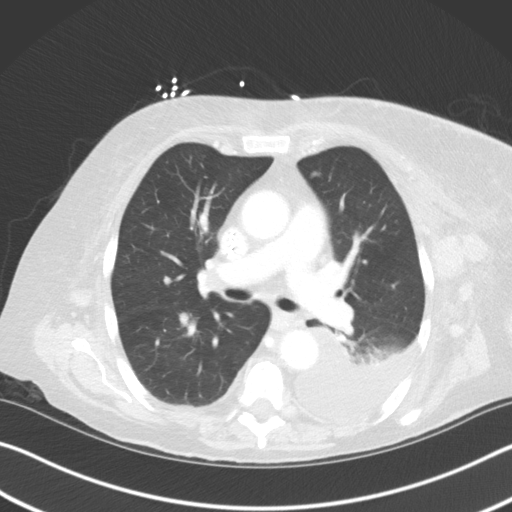
[im 62/98  mediastinal]
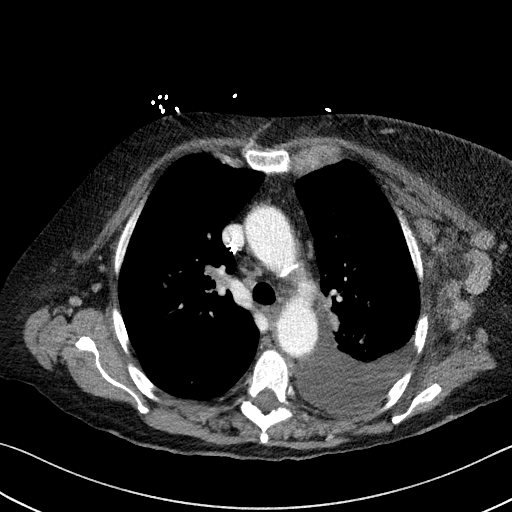
[im 69/98  lung]
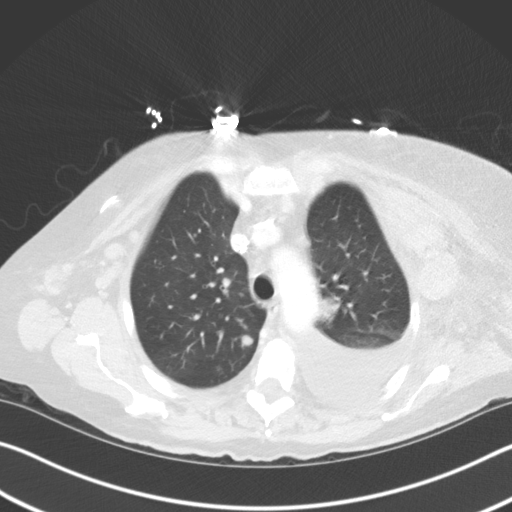
[im 72/98  mediastinal]
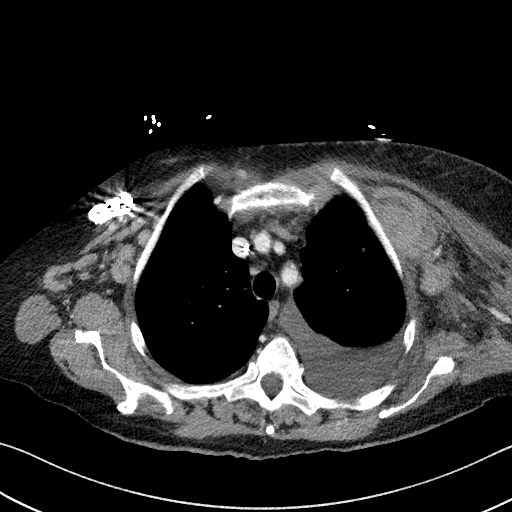
[im 80/98  lung]
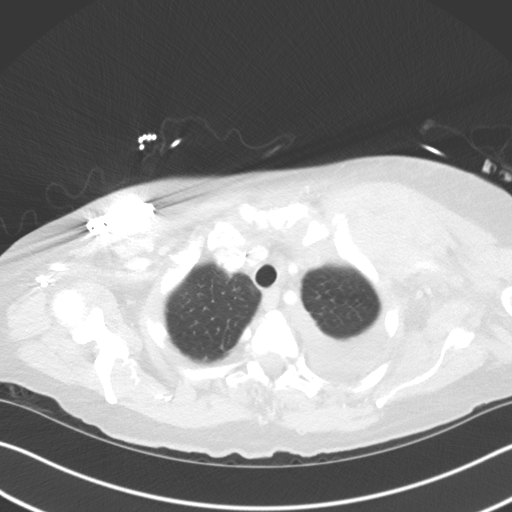
[im 87/98  mediastinal]
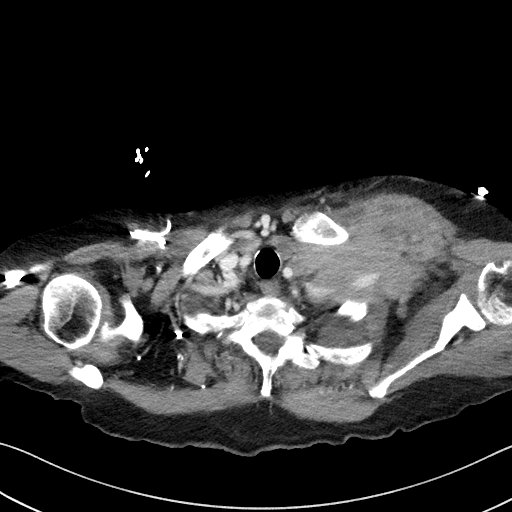
[im 94/98  lung]
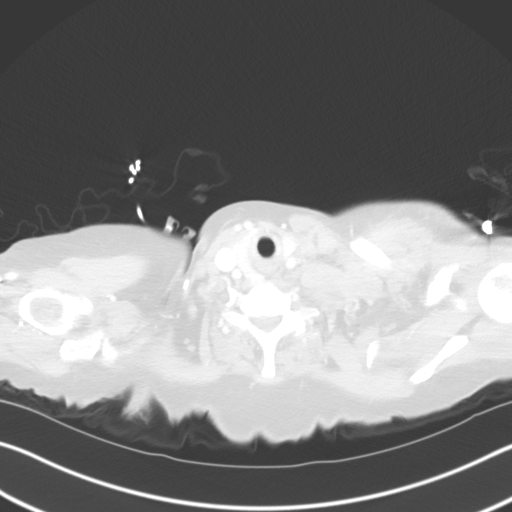

[Series 6: coronal · coronal · 0.55mm/px · 1 of 151 slices shown]
[im 76/151  mediastinal]
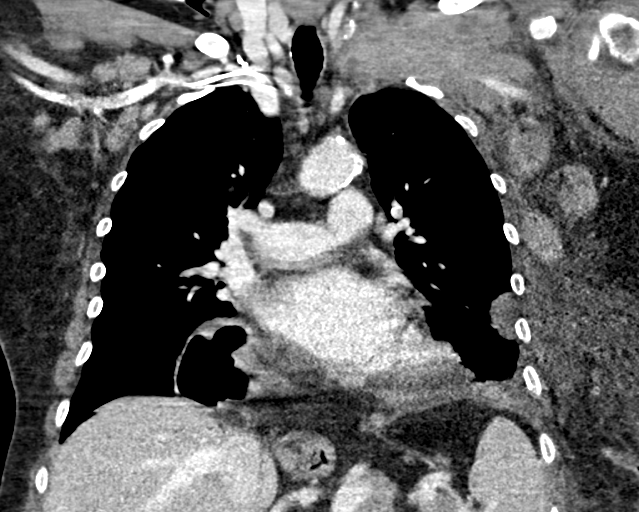

[16 of 36 positions shown; findings below may reference images not displayed]

FINDINGS: Cardiovascular: Conventional branch pattern of the great vessels
with minimal atherosclerosis at the origins. No significant
stenosis. Patent left subclavian artery traversing an area of
ill-defined soft tissue edema and enlargement as described below.
Mild aortic atherosclerosis without aneurysm or dissection.
Satisfactory opacification of the pulmonary arteries to the proximal
segmental level without large central pulmonary embolus.

Mediastinum/Nodes: Left axillary nodal enlargement is identified
ranging size from 0.3 cm to 2.8 cm short axis. Mildly enlarged
right-sided axillary lymph nodes up to 1.3 cm short axis are
identified. Large hiatal hernia with air-fluid level noted within.
1.4 cm short axis mass abutting the left lateral aspect of the
aortic arch in the prevascular space is noted more likely to
represent a pulmonary lesion as opposed lymphadenopathy.
Nonpathologic sized mediastinal and hilar lymph nodes are noted.

Lungs/Pleura: Masslike opacities are identified in the lingula, the
largest abutting the pleural surface measuring 3.3 x 1.7 cm on
series [DATE] with previously mentioned paramediastinal mass in the
left upper lobe measuring 2.3 x 1.4 cm, series [DATE]. Superior
segment left lower lobe mass measuring 1.8 x 1.3 cm, series [DATE] is
also noted.

There are smaller subcentimeter nodules within both upper lobes,
right middle lobe and right lower lobe with largest masslike
opacities in the right lower lobe measuring 1.1 x 1 cm, series [DATE]
and abutting the right lateral margin of the hiatal hernia measuring
2.5 x 1.9 cm, series [DATE].

Moderate left effusion with compressive atelectasis is identified
with trace right pleural effusion. No pneumothorax.

Upper Abdomen: No space-occupying mass of the included liver. The
included right adrenal gland is unremarkable. Nodular soft tissue
densities in the left upper quadrant are felt to represent partial
imaging of small bowel less likely mesenteric adenopathy. The
included spleen is top-normal in size.

Musculoskeletal: Ill-defined asymmetric soft tissue enlargement in
the region of the left pectoralis muscle suspicious for
intramuscular hemorrhage and edema given reported rapid onset of
left arm swelling and clinical report of low hemoglobin. Asymmetric
subcutaneous edema of the partially included left breast with
thickening of the skin concerning for possible possible inflammatory
carcinoma of the breast. Right-sided pacemaker apparatus is noted
with leads in the right atrium right ventricle. Thoracic spondylosis
is noted.

Review of the MIP images confirms the above findings.
IMPRESSION: 1. No acute large central pulmonary embolus, aortic aneurysm or
dissection.
2. Multilobar masslike opacities scattered throughout both lungs as
above described, the largest in the lingula measuring up to 3.3 cm,
presumed metastatic given left axillary adenopathy also noted.
3. Left axillary nodal enlargement measuring up to 2.8 cm short
axis.
4. Asymmetric soft tissue enlargement of the left pectoralis muscle
suspicious for intramuscular hemorrhage and edema given reported
rapid onset of left arm swelling and clinical report of low
hemoglobin. An infiltrating mass or metastasis is also a
differential considerations given local adenopathy, asymmetric
subcutaneous edema of the partially included left breast with
thickening of the skin concerning for possible inflammatory
carcinoma of the breast. Clinical correlation is therefore
recommended as well as mammographic correlation.
5. Moderate left and trace right pleural effusions with compressive
atelectasis.

These results were called by telephone at the time of interpretation
on 08/29/2018 at [DATE] to Dr. ADOLFO IVERSEN , who verbally
acknowledged these results.

Aortic Atherosclerosis ([id number]).

## 2020-10-14 IMAGING — DX DG ABD PORTABLE 1V
1 series · 1 of 1 positions shown · non-contrast
Comparison: CT 08/31/2018

CLINICAL DATA: Constipation

EXAM:
PORTABLE ABDOMEN - 1 VIEW

[abdomen kub]
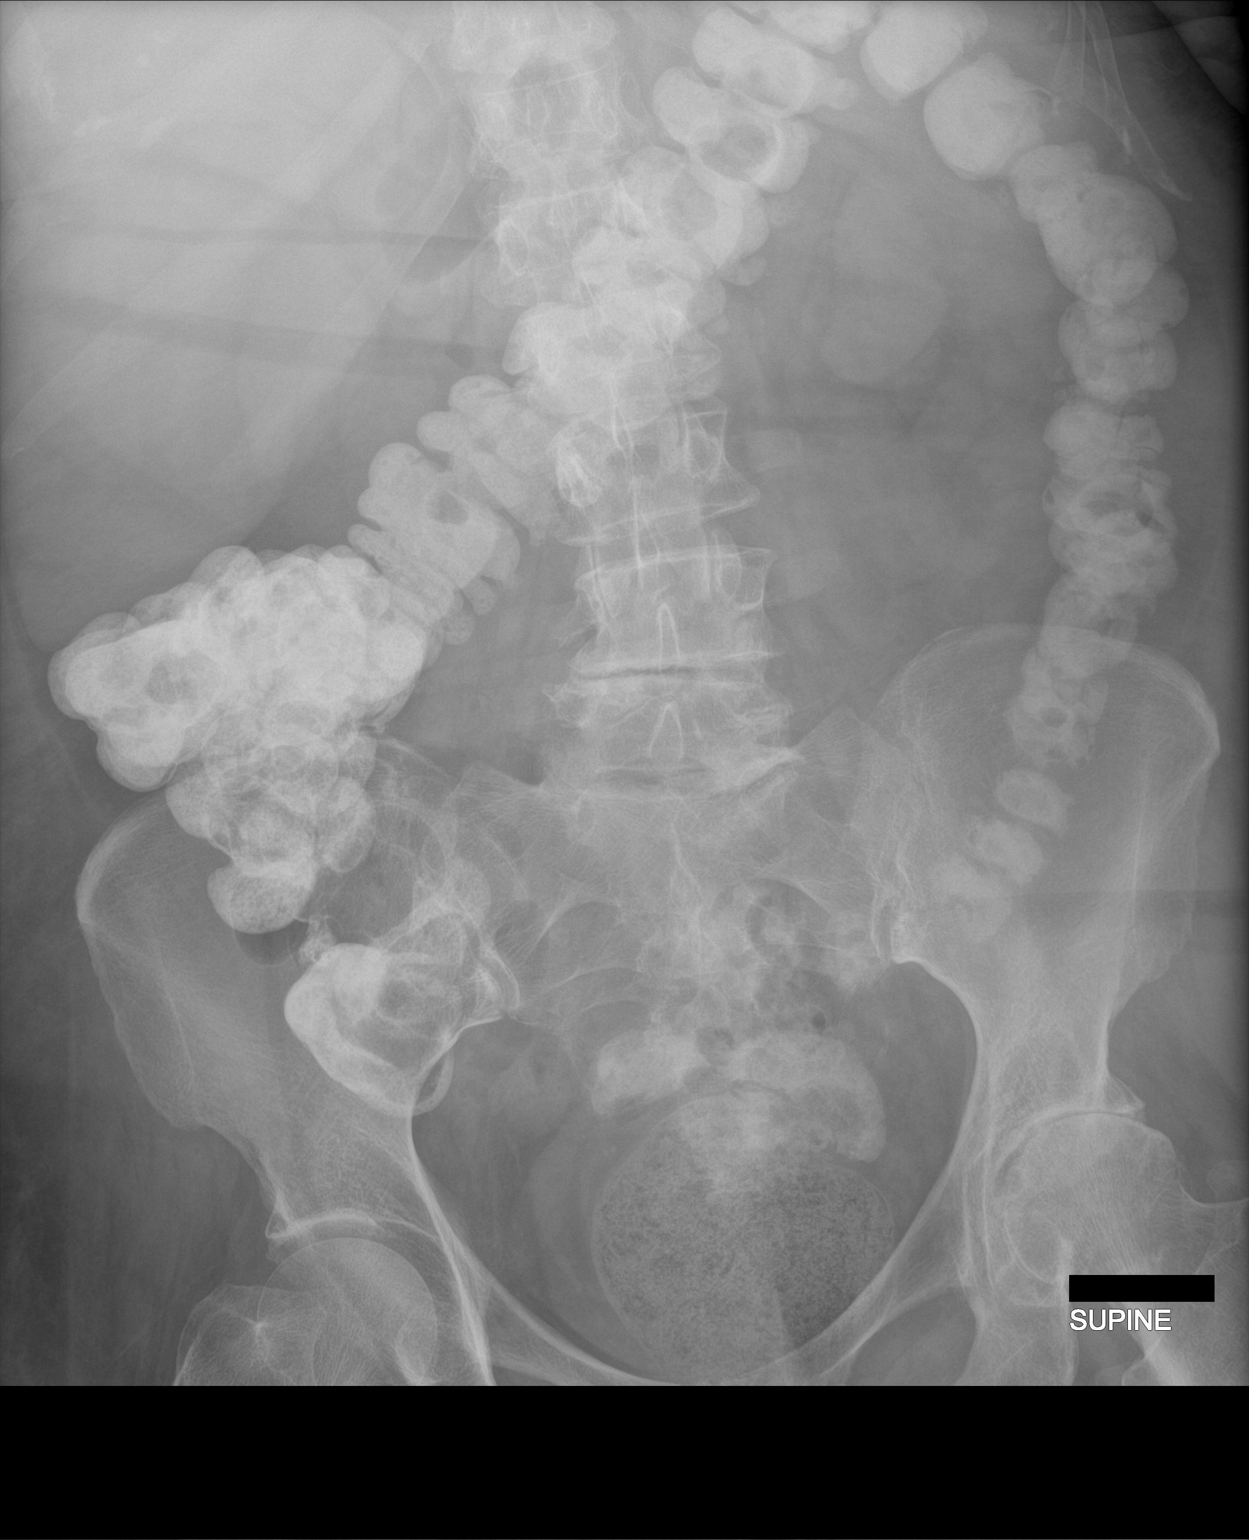

[1 of 1 positions shown; findings below may reference images not displayed]

FINDINGS: Nonobstructed bowel gas pattern. Residual contrast within the colon.
Mild to moderate feces retention at the rectum.
IMPRESSION: Nonobstructed gas pattern with mild to moderate fecal retention at
the rectum

## 2020-10-18 IMAGING — DX DG CHEST 2V
2 series · 2 of 2 positions shown · non-contrast
Comparison: Radiograph September 14, 2018.

CLINICAL DATA: Cough.

EXAM:
CHEST - 2 VIEW

[chest lat]
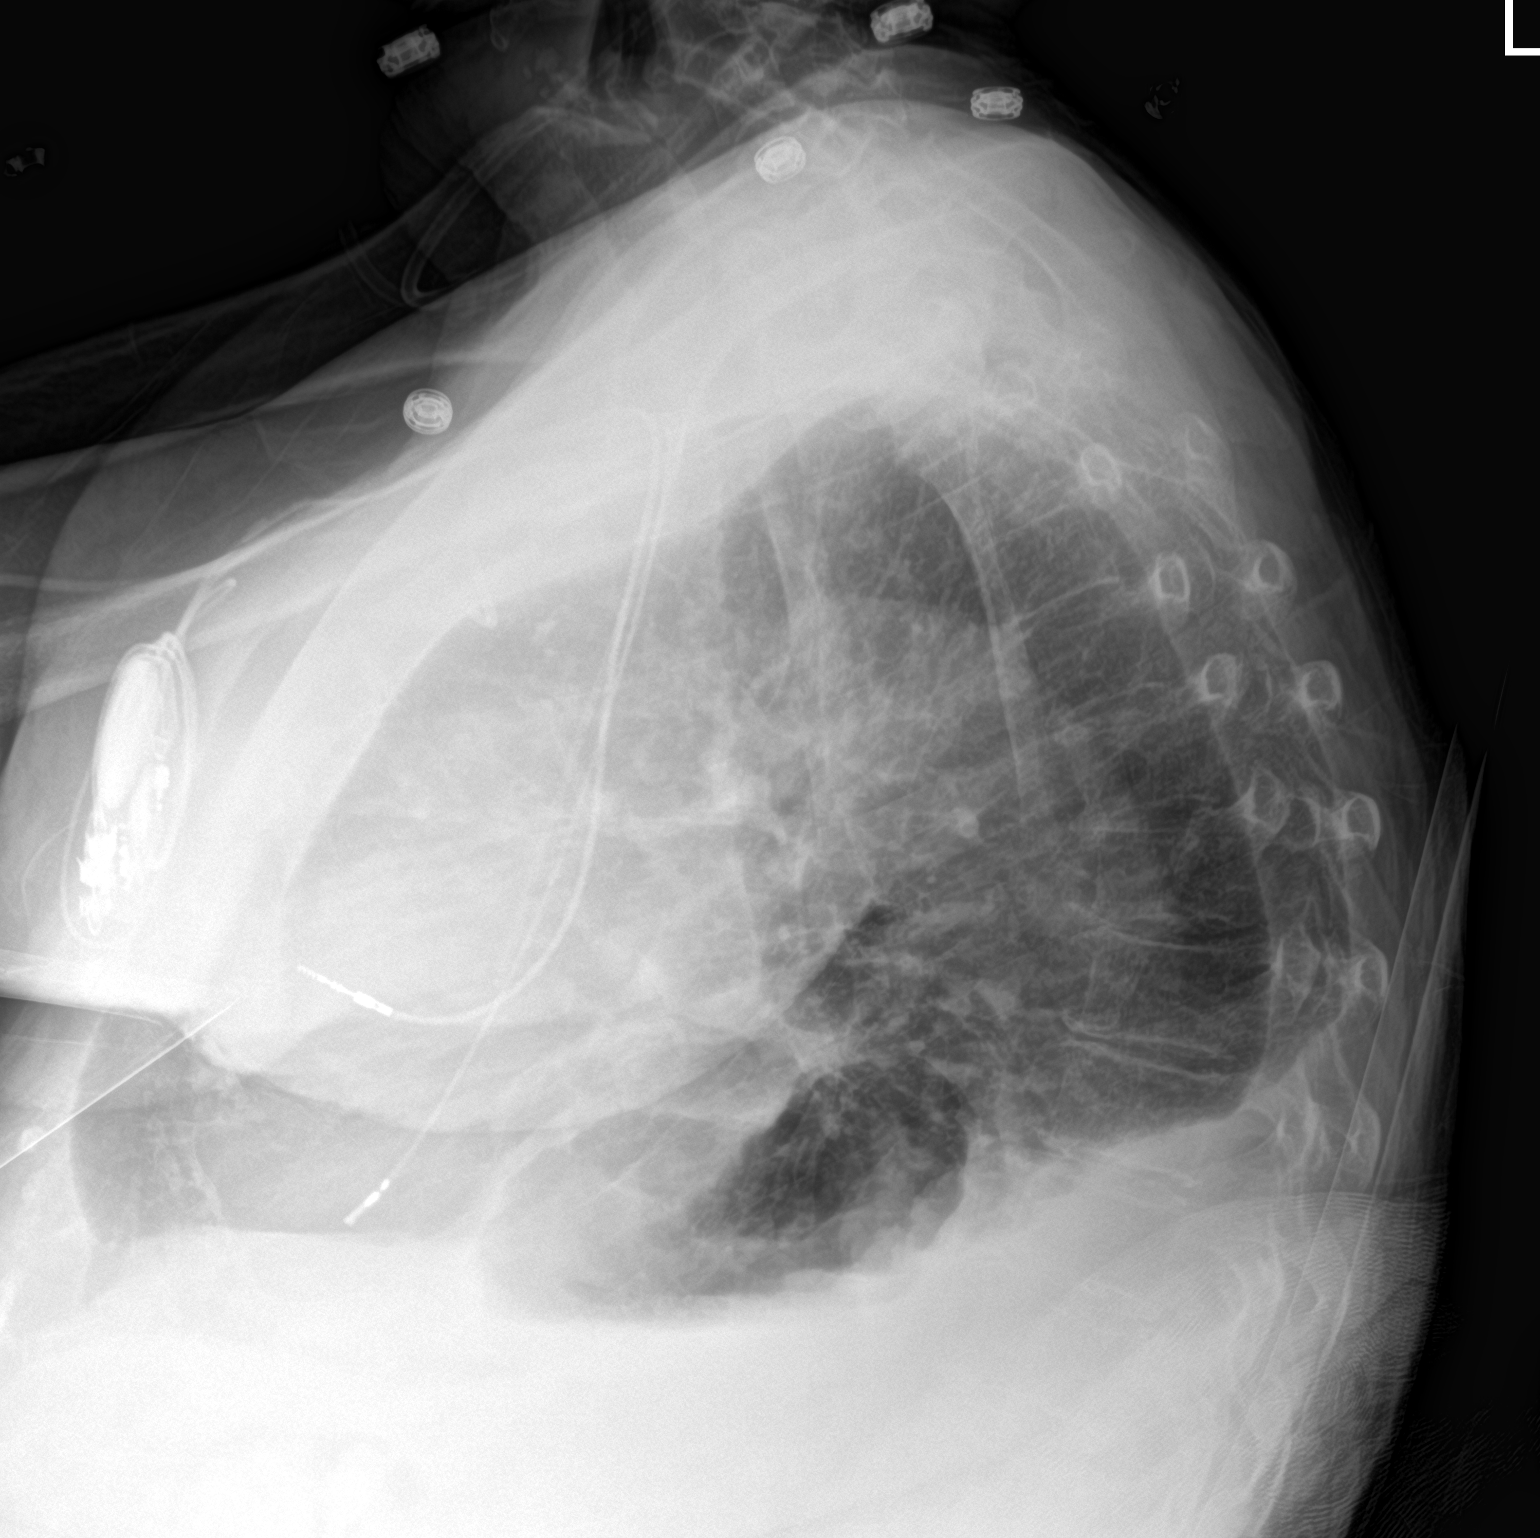

[chest ap]
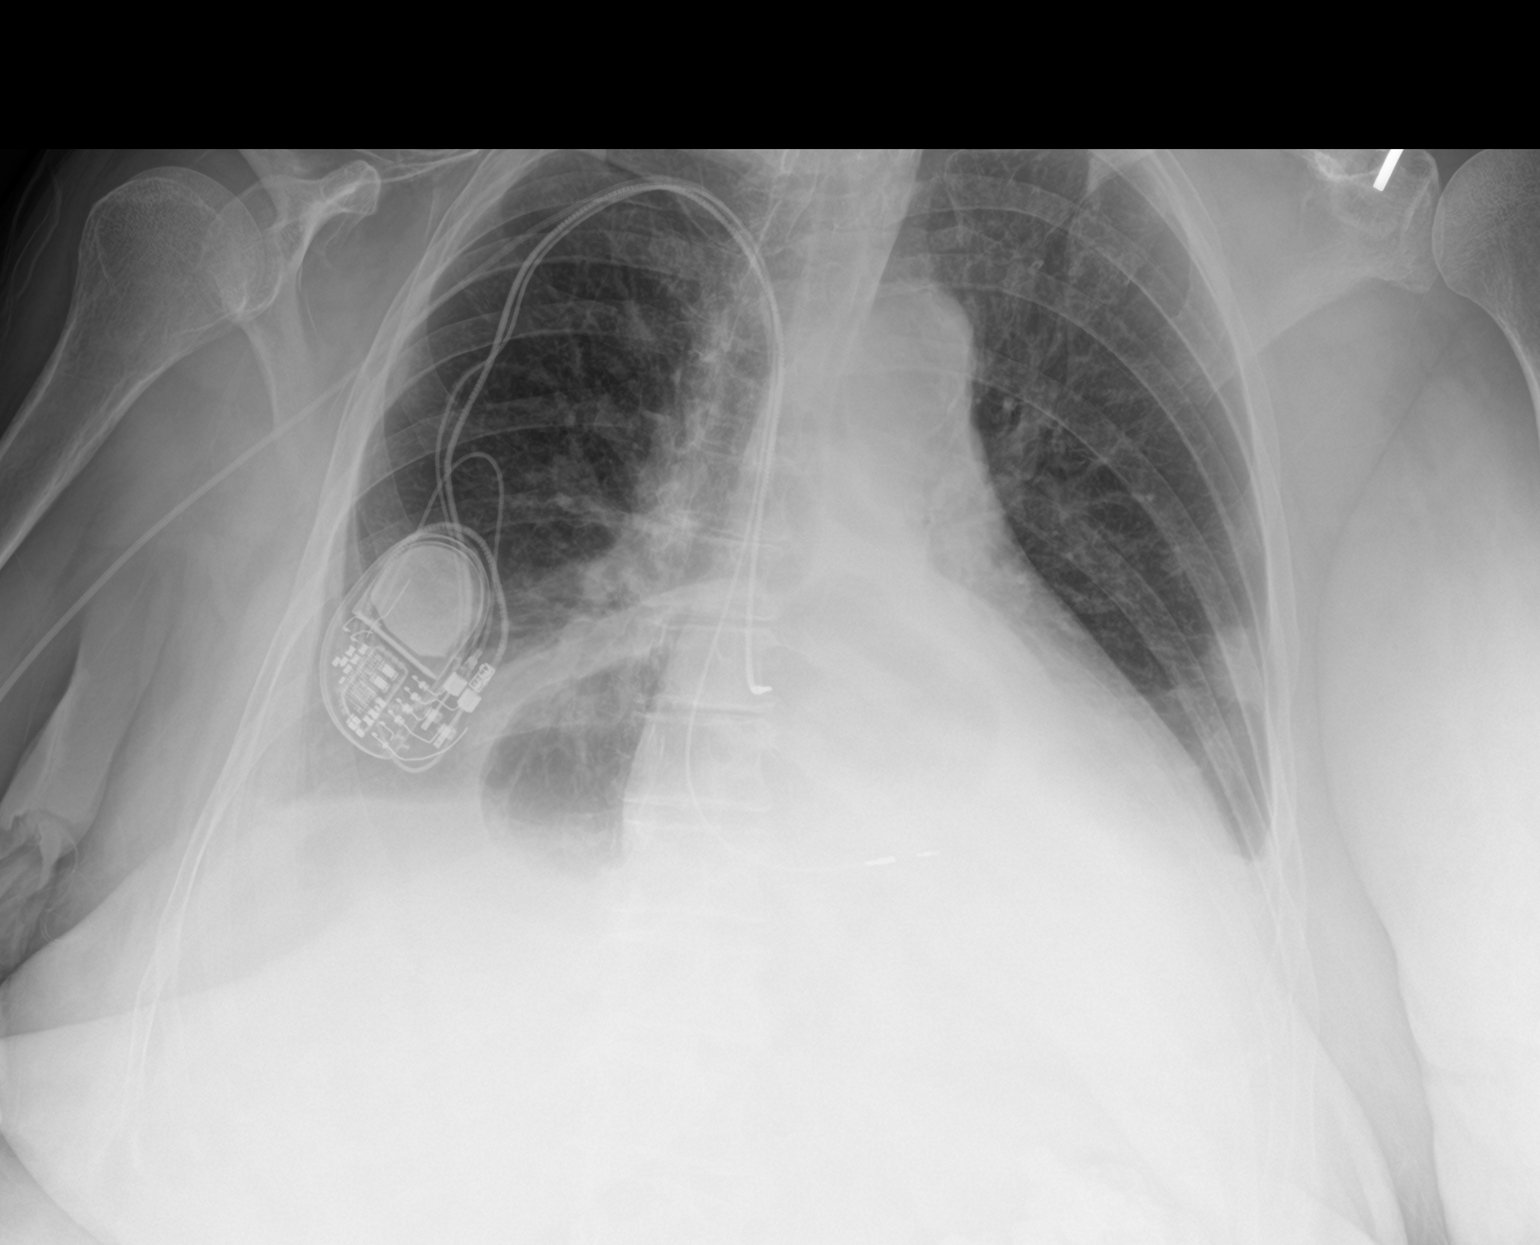

[2 of 2 positions shown; findings below may reference images not displayed]

FINDINGS: Stable cardiomediastinal silhouette. Large hiatal hernia is noted.
No pneumothorax is noted. Right-sided pacemaker is unchanged in
position. Mild bilateral pleural effusions are noted with associated
atelectasis. Bony thorax is unremarkable.
IMPRESSION: Stable large hiatal hernia. Mild bilateral pleural effusions are
noted with associated atelectasis.

## 2020-10-22 IMAGING — DX DG CHEST 1V PORT
1 series · 1 of 1 positions shown · non-contrast
Comparison: Chest radiographs 2 days prior, chest CT 09/07/2018

CLINICAL DATA: Tachypnea.

EXAM:
PORTABLE CHEST 1 VIEW

[chest ap]
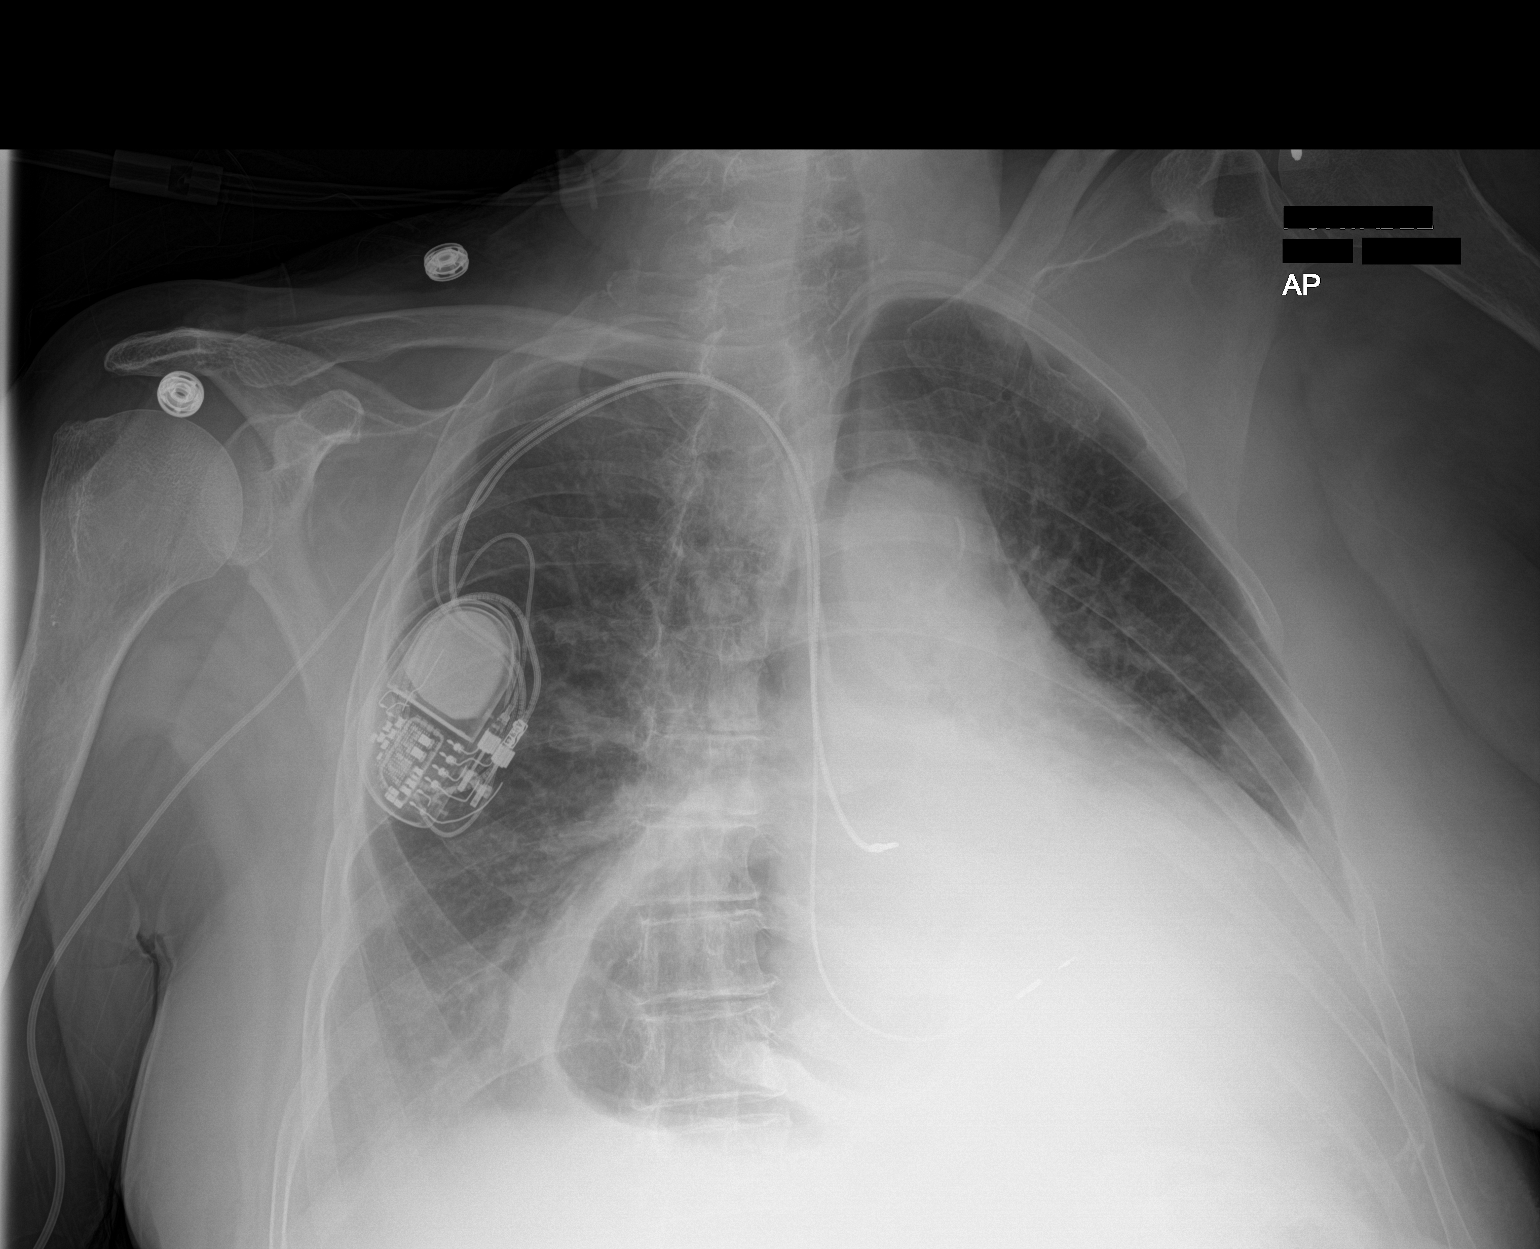

[1 of 1 positions shown; findings below may reference images not displayed]

FINDINGS: Right-sided pacemaker in place. Right upper extremity PICC in place,
tip in the proximal SVC. The patient is significantly rotated.
Bilateral pleural effusions and associated airspace disease, likely
progressed at the left lung base. Nodular density in the periphery
of the left lung again seen. Large retrocardiac hiatal hernia.
Unchanged heart size and mediastinal contours. No pulmonary edema.
IMPRESSION: Bilateral pleural effusions, increased on the left. Unchanged
radiographic appearance of large hiatal hernia. Peripheral nodular
opacity as seen on prior chest CT. Additional pulmonary nodules on
CT are not visualized radiographically
